# Patient Record
Sex: Female | Born: 1965 | Race: Black or African American | Hispanic: No | Marital: Married | Smoking: Current every day smoker
Health system: Southern US, Community
[De-identification: ages and names within clinical notes are randomized; demographics above are authoritative.]

## PROBLEM LIST (undated history)

## (undated) DIAGNOSIS — C55 Malignant neoplasm of uterus, part unspecified: Secondary | ICD-10-CM

## (undated) DIAGNOSIS — K219 Gastro-esophageal reflux disease without esophagitis: Secondary | ICD-10-CM

## (undated) DIAGNOSIS — M1990 Unspecified osteoarthritis, unspecified site: Secondary | ICD-10-CM

## (undated) DIAGNOSIS — D649 Anemia, unspecified: Secondary | ICD-10-CM

## (undated) DIAGNOSIS — G4730 Sleep apnea, unspecified: Secondary | ICD-10-CM

## (undated) DIAGNOSIS — K2970 Gastritis, unspecified, without bleeding: Secondary | ICD-10-CM

## (undated) DIAGNOSIS — K589 Irritable bowel syndrome without diarrhea: Secondary | ICD-10-CM

## (undated) DIAGNOSIS — I10 Essential (primary) hypertension: Secondary | ICD-10-CM

## (undated) DIAGNOSIS — C541 Malignant neoplasm of endometrium: Secondary | ICD-10-CM

## (undated) DIAGNOSIS — E119 Type 2 diabetes mellitus without complications: Secondary | ICD-10-CM

## (undated) DIAGNOSIS — E039 Hypothyroidism, unspecified: Secondary | ICD-10-CM

## (undated) DIAGNOSIS — Z1211 Encounter for screening for malignant neoplasm of colon: Secondary | ICD-10-CM

## (undated) DIAGNOSIS — R51 Headache: Secondary | ICD-10-CM

## (undated) DIAGNOSIS — G909 Disorder of the autonomic nervous system, unspecified: Secondary | ICD-10-CM

## (undated) DIAGNOSIS — F32A Depression, unspecified: Secondary | ICD-10-CM

## (undated) DIAGNOSIS — F329 Major depressive disorder, single episode, unspecified: Secondary | ICD-10-CM

## (undated) DIAGNOSIS — K859 Acute pancreatitis without necrosis or infection, unspecified: Secondary | ICD-10-CM

## (undated) DIAGNOSIS — R0602 Shortness of breath: Secondary | ICD-10-CM

## (undated) DIAGNOSIS — F419 Anxiety disorder, unspecified: Secondary | ICD-10-CM

## (undated) DIAGNOSIS — E7800 Pure hypercholesterolemia, unspecified: Secondary | ICD-10-CM

## (undated) DIAGNOSIS — E559 Vitamin D deficiency, unspecified: Secondary | ICD-10-CM

## (undated) DIAGNOSIS — L0293 Carbuncle, unspecified: Secondary | ICD-10-CM

## (undated) DIAGNOSIS — J449 Chronic obstructive pulmonary disease, unspecified: Secondary | ICD-10-CM

## (undated) DIAGNOSIS — L0292 Furuncle, unspecified: Secondary | ICD-10-CM

## (undated) DIAGNOSIS — G5600 Carpal tunnel syndrome, unspecified upper limb: Secondary | ICD-10-CM

## (undated) HISTORY — PX: OOPHORECTOMY: SHX86

## (undated) HISTORY — DX: Major depressive disorder, single episode, unspecified: F32.9

## (undated) HISTORY — DX: Acute pancreatitis without necrosis or infection, unspecified: K85.90

## (undated) HISTORY — DX: Malignant neoplasm of uterus, part unspecified: C55

## (undated) HISTORY — DX: Unspecified osteoarthritis, unspecified site: M19.90

## (undated) HISTORY — DX: Vitamin D deficiency, unspecified: E55.9

## (undated) HISTORY — DX: Depression, unspecified: F32.A

## (undated) HISTORY — DX: Encounter for screening for malignant neoplasm of colon: Z12.11

## (undated) HISTORY — DX: Pure hypercholesterolemia, unspecified: E78.00

## (undated) HISTORY — DX: Anxiety disorder, unspecified: F41.9

## (undated) HISTORY — DX: Carpal tunnel syndrome, unspecified upper limb: G56.00

## (undated) HISTORY — PX: TUBAL LIGATION: SHX77

## (undated) HISTORY — DX: Disorder of the autonomic nervous system, unspecified: G90.9

## (undated) HISTORY — DX: Malignant neoplasm of endometrium: C54.1

## (undated) HISTORY — DX: Essential (primary) hypertension: I10

## (undated) HISTORY — PX: FOOT SURGERY: SHX648

## (undated) HISTORY — DX: Sleep apnea, unspecified: G47.30

## (undated) HISTORY — DX: Irritable bowel syndrome, unspecified: K58.9

## (undated) HISTORY — DX: Gastro-esophageal reflux disease without esophagitis: K21.9

## (undated) HISTORY — DX: Gastritis, unspecified, without bleeding: K29.70

---

## 1994-09-11 HISTORY — PX: REDUCTION MAMMAPLASTY: SUR839

## 2002-02-18 ENCOUNTER — Emergency Department (HOSPITAL_COMMUNITY): Admission: EM | Admit: 2002-02-18 | Discharge: 2002-02-18 | Payer: Self-pay | Admitting: Emergency Medicine

## 2004-07-18 ENCOUNTER — Ambulatory Visit (HOSPITAL_COMMUNITY): Admission: RE | Admit: 2004-07-18 | Discharge: 2004-07-18 | Payer: Self-pay | Admitting: Obstetrics & Gynecology

## 2005-01-15 ENCOUNTER — Emergency Department (HOSPITAL_COMMUNITY): Admission: EM | Admit: 2005-01-15 | Discharge: 2005-01-15 | Payer: Self-pay | Admitting: Emergency Medicine

## 2005-01-25 ENCOUNTER — Ambulatory Visit (HOSPITAL_COMMUNITY): Admission: RE | Admit: 2005-01-25 | Discharge: 2005-01-25 | Payer: Self-pay | Admitting: Family Medicine

## 2005-10-16 ENCOUNTER — Ambulatory Visit (HOSPITAL_COMMUNITY): Admission: RE | Admit: 2005-10-16 | Discharge: 2005-10-16 | Payer: Self-pay | Admitting: Family Medicine

## 2006-10-18 ENCOUNTER — Ambulatory Visit (HOSPITAL_COMMUNITY): Admission: RE | Admit: 2006-10-18 | Discharge: 2006-10-18 | Payer: Self-pay | Admitting: Internal Medicine

## 2007-10-24 ENCOUNTER — Ambulatory Visit (HOSPITAL_COMMUNITY): Admission: RE | Admit: 2007-10-24 | Discharge: 2007-10-24 | Payer: Self-pay | Admitting: Family Medicine

## 2008-10-26 ENCOUNTER — Ambulatory Visit (HOSPITAL_COMMUNITY): Admission: RE | Admit: 2008-10-26 | Discharge: 2008-10-26 | Payer: Self-pay | Admitting: Family Medicine

## 2009-12-06 ENCOUNTER — Emergency Department (HOSPITAL_COMMUNITY): Admission: EM | Admit: 2009-12-06 | Discharge: 2009-12-06 | Payer: Self-pay | Admitting: Emergency Medicine

## 2010-02-07 ENCOUNTER — Emergency Department (HOSPITAL_COMMUNITY): Admission: EM | Admit: 2010-02-07 | Discharge: 2010-02-07 | Payer: Self-pay | Admitting: Emergency Medicine

## 2010-11-28 LAB — POCT CARDIAC MARKERS
CKMB, poc: <1 ng/mL — ABNORMAL LOW (ref 1.0–8.0)
Myoglobin, poc: 97.7 ng/mL (ref 12–200)
Troponin i, poc: <0.05 ng/mL (ref 0.00–0.09)

## 2011-04-03 ENCOUNTER — Ambulatory Visit: Payer: Self-pay | Admitting: Gastroenterology

## 2011-04-10 ENCOUNTER — Ambulatory Visit (INDEPENDENT_AMBULATORY_CARE_PROVIDER_SITE_OTHER): Payer: Self-pay | Admitting: Gastroenterology

## 2011-04-10 ENCOUNTER — Encounter: Payer: Self-pay | Admitting: Gastroenterology

## 2011-04-10 DIAGNOSIS — K219 Gastro-esophageal reflux disease without esophagitis: Secondary | ICD-10-CM

## 2011-04-10 DIAGNOSIS — R109 Unspecified abdominal pain: Secondary | ICD-10-CM

## 2011-04-10 DIAGNOSIS — K5900 Constipation, unspecified: Secondary | ICD-10-CM

## 2011-04-10 MED ORDER — OMEPRAZOLE 20 MG PO CPDR: 20.0000 mg | DELAYED_RELEASE_CAPSULE | Freq: Every day | ORAL | Status: DC

## 2011-04-10 NOTE — Progress Notes (Signed)
Referring Provider: No ref. provider found Primary Care Physician:  Tylene Fantasia., PA, PA-C Primary Gastroenterologist:  Dr. Darrick Penna  Chief Complaint  Patient presents with  . Abdominal Pain  . Constipation    HPI:   [name] is a pleasant 45 year old female who presents today with concerns of upper abdominal pain and "knots" in her abdomen. She reports history of chronic constipation, with lengths of time between BMs up to 2 weeks. She is only able to have a BM when she takes a laxative. Reports feeling bloated. Complains of upper abdominal discomfort, sharp, constant, worsened with intercourse. States this pain has been present since her son was born 20+ years ago. Feels like she has knots bilaterally. Denies brbpr. However, states stool is "black" when she "cleans herself out".  Reports upper abdominal pain worse with eating, sensation of early satiety. No N/V. Denies dysphagia, yet she reports feeling "thickness" in her throat. Ibuprofen,  BC powders, every once in awhile.   Complains of "bad" indigestion, sometimes nocturnal reflux. Not on a PPI, taking Zantac.    Past Medical History  Diagnosis Date  . HTN (hypertension)   . GERD (gastroesophageal reflux disease)   . Constipation   . Hypercholesterolemia     Past Surgical History  Procedure Date  . Cesarean section   . Breast reduction surgery     Current Outpatient Prescriptions  Medication Sig Dispense Refill  . co-enzyme Q-10 30 MG capsule Take 30 mg by mouth 3 (three) times daily.        . famotidine (PEPCID) 20 MG tablet Take 20 mg by mouth 2 (two) times daily.        . furosemide (LASIX) 40 MG tablet Take 40 mg by mouth 2 (two) times daily.        [name] lisinopril-hydrochlorothiazide (PRINZIDE,ZESTORETIC) 20-25 MG per tablet Take 1 tablet by mouth daily.        . ranitidine (ZANTAC) 150 MG tablet Take 150 mg by mouth 2 (two) times daily.        [name] omeprazole (PRILOSEC) 20 MG capsule Take 1 capsule (20 mg total)  by mouth daily. Take 30 minutes before breakfast.  31 capsule  3    Allergies as of 04/10/2011  . (No Known Allergies)    Family History  Problem Relation Age of Onset  . Colon cancer Neg Hx     History   Social History  . Marital Status: Single    Spouse Name: N/A    Number of Children: 1  . Years of Education: N/A   Occupational History  . Not on file.   Social History Main Topics  . Smoking status: Current Everyday Smoker -- 1.0 packs/day for 22 years    Types: Cigarettes  . Smokeless tobacco: Not on file  . Alcohol Use: No  . Drug Use: No  . Sexually Active: Not on file   Other Topics Concern  . Not on file   Social History Narrative  . No narrative on file    Review of Systems: Gen: Denies any fever, chills, loss of appetite, fatigue, weight loss. CV: Denies chest pain, heart palpitations, syncope, peripheral edema. Resp: Denies shortness of breath with rest, cough, wheezing GI: Denies dysphagia or odynophagia. Denies hematemesis, fecal incontinence, or jaundice.  GU : Denies urinary burning, urinary frequency, urinary incontinence.  MS: Denies joint pain, muscle weakness, cramps, limited movement Derm: Denies rash, itching, dry skin Psych: Denies depression, anxiety, confusion or memory loss  Heme: Denies  bruising, bleeding, and enlarged lymph nodes.  Physical Exam: BP 149/88  Pulse 88  Temp(Src) 97.9 F (36.6 C) (Temporal)  Ht 5\' 6"  (1.676 m)  Wt 282 lb (127.914 kg)  BMI 45.52 kg/m2 General:   Alert and oriented. Well-developed, well-nourished, pleasant and cooperative. Obese.  Head:  Normocephalic and atraumatic. Eyes:  Conjunctiva pink, sclera clear, no icterus.   Ears:  Normal auditory acuity. Nose:  No deformity, discharge,  or lesions. Mouth:  No deformity or lesions, mucosa pink and moist.  Neck:  Supple, without mass or thyromegaly. Lungs:  Clear to auscultation bilaterally, without wheezing, rales, or rhonchi.  Heart:  S1, S2 present  without murmurs noted.  Abdomen:  Obese, large AP diameter,+BS, soft, tender to palpation bilateral upper abdomen. non-distended. No definite mass appreciated on physical exam. When patient stood up, firmness noted bilateral upper abdomen, might very well be adipose tissue. Without mass or HSM. No rebound or guarding. No hernias noted. Rectal:  Deferred  Msk:  Symmetrical without gross deformities. Normal posture. Extremities:  Without clubbing or edema. Neurologic:  Alert and  oriented x4;  grossly normal neurologically. Skin:  Intact, warm and dry without significant lesions or rashes Cervical Nodes:  No significant cervical adenopathy. Psych:  Alert and cooperative. Normal mood and affect.

## 2011-04-10 NOTE — Patient Instructions (Signed)
Stop taking Ranitidine. Start taking Prilosec 20 mg daily, 30 minutes before breakfast. This has been sent to your pharmacy, and we have provided samples.  Please talk with Lubertha Basque so we can proceed with CT and labs.  Start following a high fiber diet.  Add a probiotic daily. Samples have been provided (can get [employer] over the counter)  Take Miralax as needed for constipation. You may take daily if needed.  We will see you back in 4 weeks.

## 2011-04-11 DIAGNOSIS — K219 Gastro-esophageal reflux disease without esophagitis: Secondary | ICD-10-CM | POA: Insufficient documentation

## 2011-04-11 DIAGNOSIS — K5900 Constipation, unspecified: Secondary | ICD-10-CM | POA: Insufficient documentation

## 2011-04-11 DIAGNOSIS — R109 Unspecified abdominal pain: Secondary | ICD-10-CM | POA: Insufficient documentation

## 2011-04-11 NOTE — Assessment & Plan Note (Signed)
Obese, needs dietary modification, wt loss, PPI. See abdominal pain.

## 2011-04-11 NOTE — Assessment & Plan Note (Signed)
See "abdominal pain"

## 2011-04-11 NOTE — Assessment & Plan Note (Signed)
45 year old African-American female with complaints of bilateral upper abdominal pain for 20+ years, since birth of son. Reports "knots" on her abdomen in area of pain. Constant, worsened with intercourse. Sensation of early satiety, thickness in her throat. No N/V or dysphagia. Rare use of BC powders, Ibuprofen. Pt has central pattern of obesity; "knots" she feels may very well be adipose tissue, doubt an acute etiology or mass at this time. Worsening reflux, nocturnal reflux. On Zantac. No insurance, needs [facility] assistance. Abdominal pain may be r/t gastritis, PUD, doubt hepatobiliary component or chronic pancreatitis. Question of constipation compounding symptoms or exacerbating.   Start Prilosec. Stoop Zantac HFP, CBC, lipase  ifobt High fiber diet for constipation Probiotic Miralax prn F/U in 4 weeks CT in future to assess areas of concern ("knots" in abdomen) once pt obtains financial assistance Likely proceeding with colonoscopy+/- EGD in future after review of labs, trial of PPI

## 2011-04-12 NOTE — Progress Notes (Signed)
Cc to PCP 

## 2011-04-17 NOTE — Progress Notes (Signed)
AGREE. PT NEEDS EGD. TCS ONLY IF ALARM SYMPTOMS

## 2011-04-20 ENCOUNTER — Telehealth: Payer: Self-pay | Admitting: Gastroenterology

## 2011-04-20 NOTE — Telephone Encounter (Signed)
Pt has not completed labs or CT as ordered. Needs EGD as well, no colonoscopy at this time. Please have pt complete labs and CT, then we will proceed with EGD.

## 2011-04-25 ENCOUNTER — Telehealth: Payer: Self-pay | Admitting: Gastroenterology

## 2011-04-25 DIAGNOSIS — K219 Gastro-esophageal reflux disease without esophagitis: Secondary | ICD-10-CM

## 2011-04-25 NOTE — Telephone Encounter (Signed)
Sonya James called an stated that the patient is 100% approved for [facility] [employer] and she is now ready to go forward with her care. I called Sonya James and asked if she still wanted to go through with the CT ABD W/CM and she said yes.

## 2011-04-25 NOTE — Telephone Encounter (Signed)
Please see 8/14 phone note

## 2011-04-25 NOTE — Telephone Encounter (Signed)
Great. Needs to proceed with labs as outlined in OV and CT. Then, needs to be set up for EGD. Dr. Darrick Penna.

## 2011-04-26 NOTE — Telephone Encounter (Signed)
Pt is scheduled for ct scan 04/27/11@2 :45pm.  She is also scheduled for her procedure 05/22/11@7 :30am

## 2011-04-26 NOTE — Telephone Encounter (Signed)
Addended by: Jennings Books on: 04/26/2011 12:59 PM   Modules accepted: Orders

## 2011-04-27 ENCOUNTER — Ambulatory Visit (HOSPITAL_COMMUNITY)
Admission: RE | Admit: 2011-04-27 | Discharge: 2011-04-27 | Disposition: A | Discharge status: Home / Self Care | Payer: Self-pay | Source: Ambulatory Visit | Attending: Gastroenterology | Admitting: Gastroenterology

## 2011-04-27 DIAGNOSIS — F17200 Nicotine dependence, unspecified, uncomplicated: Secondary | ICD-10-CM | POA: Insufficient documentation

## 2011-04-27 DIAGNOSIS — I10 Essential (primary) hypertension: Secondary | ICD-10-CM | POA: Insufficient documentation

## 2011-04-27 DIAGNOSIS — R109 Unspecified abdominal pain: Secondary | ICD-10-CM | POA: Insufficient documentation

## 2011-04-27 DIAGNOSIS — E7800 Pure hypercholesterolemia, unspecified: Secondary | ICD-10-CM | POA: Insufficient documentation

## 2011-04-27 LAB — HEPATIC FUNCTION PANEL
ALT: 13 U/L (ref 0–35)
AST: 16 U/L (ref 0–37)
Albumin: 4.3 g/dL (ref 3.5–5.2)
Alkaline Phosphatase: 78 U/L (ref 39–117)
Bilirubin, Direct: 0.1 mg/dL (ref 0.0–0.3)
Indirect Bilirubin: 0.2 mg/dL — ABNORMAL LOW (ref 0.3–0.9)
Total Bilirubin: 0.3 mg/dL (ref 0.3–1.2)
Total Protein: 8.6 g/dL — ABNORMAL HIGH (ref 6.0–8.3)

## 2011-04-27 LAB — LIPASE, BLOOD: Lipase: 30 U/L (ref 11–59)

## 2011-04-27 MED ORDER — IOHEXOL 300 MG/ML  SOLN
100.0000 mL | Freq: Once | INTRAMUSCULAR | Status: AC | PRN
  Administered 2011-04-27: 100 mL via INTRAVENOUS

## 2011-04-28 LAB — DIFFERENTIAL
Basophils Absolute: 0 10*3/uL (ref 0.0–0.1)
Basophils Relative: 0 % (ref 0–1)
Eosinophils Absolute: 0.3 10*3/uL (ref 0.0–0.7)
Eosinophils Relative: 4 % (ref 0–5)
Lymphocytes Relative: 38 % (ref 12–46)
Lymphs Abs: 3 10*3/uL (ref 0.7–4.0)
Monocytes Absolute: 0.6 10*3/uL (ref 0.1–1.0)
Monocytes Relative: 7 % (ref 3–12)
Neutro Abs: 4 10*3/uL (ref 1.7–7.7)
Neutrophils Relative %: 51 % (ref 43–77)

## 2011-04-28 LAB — CBC
HCT: 41.2 % (ref 36.0–46.0)
Hemoglobin: 13.8 g/dL (ref 12.0–15.0)
MCH: 30.7 pg (ref 26.0–34.0)
MCHC: 33.5 g/dL (ref 30.0–36.0)
MCV: 91.8 fL (ref 78.0–100.0)
Platelets: 393 10*3/uL (ref 150–400)
RBC: 4.49 MIL/uL (ref 3.87–5.11)
RDW: 12.6 % (ref 11.5–15.5)
WBC: 7.8 10*3/uL (ref 4.0–10.5)

## 2011-05-01 NOTE — Progress Notes (Signed)
Quick Note:  Pt informed ______ 

## 2011-05-01 NOTE — Progress Notes (Signed)
Quick Note:  PT informed, CT normal and liver and pancreas enzymes are normal. ______

## 2011-05-08 ENCOUNTER — Ambulatory Visit: Payer: Self-pay | Admitting: Gastroenterology

## 2011-05-08 ENCOUNTER — Telehealth: Payer: Self-pay | Admitting: Gastroenterology

## 2011-05-09 NOTE — Telephone Encounter (Signed)
Routed to provider

## 2011-05-19 MED ORDER — SODIUM CHLORIDE 0.45 % IV SOLN
Freq: Once | INTRAVENOUS | Status: AC
  Administered 2011-05-22: 08:00:00 via INTRAVENOUS

## 2011-05-22 ENCOUNTER — Other Ambulatory Visit: Payer: Self-pay | Admitting: Gastroenterology

## 2011-05-22 ENCOUNTER — Encounter (HOSPITAL_COMMUNITY)
Admission: RE | Disposition: A | Discharge status: Home / Self Care | Payer: Self-pay | Source: Ambulatory Visit | Attending: Gastroenterology

## 2011-05-22 ENCOUNTER — Encounter (HOSPITAL_COMMUNITY): Payer: Self-pay | Admitting: *Deleted

## 2011-05-22 ENCOUNTER — Ambulatory Visit (HOSPITAL_COMMUNITY)
Admission: RE | Admit: 2011-05-22 | Discharge: 2011-05-22 | Disposition: A | Discharge status: Home / Self Care | Payer: Self-pay | Source: Ambulatory Visit | Attending: Gastroenterology | Admitting: Gastroenterology

## 2011-05-22 DIAGNOSIS — K2990 Gastroduodenitis, unspecified, without bleeding: Secondary | ICD-10-CM

## 2011-05-22 DIAGNOSIS — K2970 Gastritis, unspecified, without bleeding: Secondary | ICD-10-CM

## 2011-05-22 DIAGNOSIS — Z79899 Other long term (current) drug therapy: Secondary | ICD-10-CM | POA: Insufficient documentation

## 2011-05-22 DIAGNOSIS — I10 Essential (primary) hypertension: Secondary | ICD-10-CM | POA: Insufficient documentation

## 2011-05-22 DIAGNOSIS — A048 Other specified bacterial intestinal infections: Secondary | ICD-10-CM | POA: Insufficient documentation

## 2011-05-22 DIAGNOSIS — K2940 Chronic atrophic gastritis without bleeding: Secondary | ICD-10-CM | POA: Insufficient documentation

## 2011-05-22 DIAGNOSIS — R1013 Epigastric pain: Secondary | ICD-10-CM | POA: Insufficient documentation

## 2011-05-22 HISTORY — DX: Shortness of breath: R06.02

## 2011-05-22 HISTORY — DX: Headache: R51

## 2011-05-22 HISTORY — DX: Chronic obstructive pulmonary disease, unspecified: J44.9

## 2011-05-22 HISTORY — PX: ESOPHAGOGASTRODUODENOSCOPY: SHX5428

## 2011-05-22 SURGERY — EGD (ESOPHAGOGASTRODUODENOSCOPY)
Anesthesia: Moderate Sedation

## 2011-05-22 MED ORDER — MIDAZOLAM HCL 5 MG/5ML IJ SOLN
INTRAMUSCULAR | Status: DC | PRN
  Administered 2011-05-22 (×2): 2 mg via INTRAVENOUS

## 2011-05-22 MED ORDER — FAMOTIDINE 20 MG PO TABS: 20.0000 mg | ORAL_TABLET | Freq: Two times a day (BID) | ORAL | Status: DC | PRN

## 2011-05-22 MED ORDER — MEPERIDINE HCL 100 MG/ML IJ SOLN
INTRAMUSCULAR | Status: AC
  Filled 2011-05-22: qty 2

## 2011-05-22 MED ORDER — RANITIDINE HCL 150 MG PO TABS: 150.0000 mg | ORAL_TABLET | Freq: Two times a day (BID) | ORAL | Status: DC

## 2011-05-22 MED ORDER — MEPERIDINE HCL 100 MG/ML IJ SOLN
INTRAMUSCULAR | Status: DC | PRN
  Administered 2011-05-22: 50 mg via INTRAVENOUS
  Administered 2011-05-22: 25 mg via INTRAVENOUS

## 2011-05-22 MED ORDER — SODIUM CHLORIDE 0.9 % IJ SOLN
INTRAMUSCULAR | Status: AC
  Filled 2011-05-22: qty 10

## 2011-05-22 MED ORDER — BUTAMBEN-TETRACAINE-BENZOCAINE 2-2-14 % EX AERO
INHALATION_SPRAY | CUTANEOUS | Status: DC | PRN
  Administered 2011-05-22: 2 via TOPICAL

## 2011-05-22 MED ORDER — PROMETHAZINE HCL 25 MG/ML IJ SOLN
INTRAMUSCULAR | Status: AC
  Filled 2011-05-22: qty 1

## 2011-05-22 MED ORDER — MIDAZOLAM HCL 5 MG/5ML IJ SOLN
INTRAMUSCULAR | Status: AC
  Filled 2011-05-22: qty 10

## 2011-05-22 MED ORDER — LUBIPROSTONE 8 MCG PO CAPS: 8.0000 ug | ORAL_CAPSULE | Freq: Two times a day (BID) | ORAL | Status: DC

## 2011-05-22 NOTE — Interval H&P Note (Signed)
History and Physical Interval Note:   05/22/2011   8:52 AM   Sonya James  has presented today for surgery, with the diagnosis of gerd  The various methods of treatment have been discussed with the patient and family. After consideration of risks, benefits and other options for treatment, the patient has consented to  Procedure(s): ESOPHAGOGASTRODUODENOSCOPY (EGD) as a surgical intervention .  I have reviewed the patients' chart and labs.  Questions were answered to the patient's satisfaction.     [name]  MD

## 2011-05-22 NOTE — H&P (Signed)
Reason for Visit     Abdominal Pain    Constipation        Current Vitals       Recorded User        04/10/2011  9:09 AM  Sonya James, NT           BP Pulse Temp (Src) Resp Ht Wt    149/88  88  97.9 F (36.6 C) (Temporal)  N/A  5\' 6"  (1.676 m)  282 lb (127.914 kg)       BMI SpO2 PF LMP    45.52 kg/m2  N/A  N/A  N/A          Progress Notes     Sonya Halls, NP  04/11/2011 10:08 PM  Signed     Referring Provider: No ref. provider found Primary Care Physician:  Sonya James., PA, PA-C Primary Gastroenterologist:  Sonya James    Chief Complaint   Patient presents with   .  Abdominal Pain   .  Constipation      HPI:    Sonya James is a pleasant 45 year old female who presents today with concerns of upper abdominal pain and "knots" in her abdomen. She reports history of chronic constipation, with lengths of time between BMs up to 2 weeks. She is only able to have a BM when she takes a laxative. Reports feeling bloated. Complains of upper abdominal discomfort, sharp, constant, worsened with intercourse. States this pain has been present since her son was born 20+ years ago. Feels like she has knots bilaterally. Denies brbpr. However, states stool is "black" when she "cleans herself out".   Reports upper abdominal pain worse with eating, sensation of early satiety. No N/V. Denies dysphagia, yet she reports feeling "thickness" in her throat. Ibuprofen,  BC powders, every once in awhile.    Complains of "bad" indigestion, sometimes nocturnal reflux. Not on a PPI, taking Zantac.       Past Medical History   Diagnosis  Date   .  HTN (hypertension)     .  GERD (gastroesophageal reflux disease)     .  Constipation     .  Hypercholesterolemia         Past Surgical History   Procedure  Date   .  Cesarean section     .  Breast reduction surgery         Current Outpatient Prescriptions   Medication  Sig  Dispense  Refill   .  co-enzyme Q-10 30 MG capsule   Take 30 mg by mouth 3 (three) times daily.           .  famotidine (PEPCID) 20 MG tablet  Take 20 mg by mouth 2 (two) times daily.           .  furosemide (LASIX) 40 MG tablet  Take 40 mg by mouth 2 (two) times daily.           [name]  lisinopril-hydrochlorothiazide (PRINZIDE,ZESTORETIC) 20-25 MG per tablet  Take 1 tablet by mouth daily.           .  ranitidine (ZANTAC) 150 MG tablet  Take 150 mg by mouth 2 (two) times daily.           [name]  omeprazole (PRILOSEC) 20 MG capsule  Take 1 capsule (20 mg total) by mouth daily. Take 30 minutes before breakfast.   31 capsule   3       Allergies  as of 04/10/2011   .  (No Known Allergies)       Family History   Problem  Relation  Age of Onset   .  Colon cancer  Neg Hx         History       Social History   .  Marital Status:  Single       Spouse Name:  N/A       Number of Children:  1   .  Years of Education:  N/A       Occupational History   .  Not on file.       Social History Main Topics   .  Smoking status:  Current Everyday Smoker -- 1.0 packs/day for 22 years       Types:  Cigarettes   .  Smokeless tobacco:  Not on file   .  Alcohol Use:  No   .  Drug Use:  No   .  Sexually Active:  Not on file       Other Topics  Concern   .  Not on file       Social History Narrative   .  No narrative on file      Review of Systems: Gen: Denies any fever, chills, loss of appetite, fatigue, weight loss. CV: Denies chest pain, heart palpitations, syncope, peripheral edema. Resp: Denies shortness of breath with rest, cough, wheezing GI: Denies dysphagia or odynophagia. Denies hematemesis, fecal incontinence, or jaundice.   GU : Denies urinary burning, urinary frequency, urinary incontinence.   MS: Denies joint pain, muscle weakness, cramps, limited movement Derm: Denies rash, itching, dry skin Psych: Denies depression, anxiety, confusion or memory loss   Heme: Denies bruising, bleeding, and enlarged lymph nodes.   Physical Exam: BP  149/88  Pulse 88  Temp(Src) 97.9 F (36.6 C) (Temporal)  Ht 5\' 6"  (1.676 m)  Wt 282 lb (127.914 kg)  BMI 45.52 kg/m2 General:   Alert and oriented. Well-developed, well-nourished, pleasant and cooperative. Obese.   Head:  Normocephalic and atraumatic. Eyes:  Conjunctiva pink, sclera clear, no icterus.    Ears:  Normal auditory acuity. Nose:  No deformity, discharge,  or lesions. Mouth:  No deformity or lesions, mucosa pink and moist.   Neck:  Supple, without mass or thyromegaly. Lungs:  Clear to auscultation bilaterally, without wheezing, rales, or rhonchi.   Heart:  S1, S2 present without murmurs noted.   Abdomen:  Obese, large AP diameter,+BS, soft, tender to palpation bilateral upper abdomen. non-distended. No definite mass appreciated on physical exam. When patient stood up, firmness noted bilateral upper abdomen, might very well be adipose tissue. Without mass or HSM. No rebound or guarding. No hernias noted. Rectal:  Deferred   Msk:  Symmetrical without gross deformities. Normal posture. Extremities:  Without clubbing or edema. Neurologic:  Alert and  oriented x4;  grossly normal neurologically. Skin:  Intact, warm and dry without significant lesions or rashes Cervical Nodes:  No significant cervical adenopathy. Psych:  Alert and cooperative. Normal mood and affect.       Sonya James  04/12/2011  9:58 AM  Signed Cc to PCP  Sonya Eva, MD  04/17/2011 10:13 AM  Signed AGREE. PT NEEDS EGD. TCS ONLY IF ALARM SYMPTOMS        Abdominal pain - Sonya Halls, NP  04/11/2011 10:07 PM  Signed 45 year old African-American female with complaints of bilateral upper abdominal pain for 20+ years, since birth of  son. Reports "knots" on her abdomen in area of pain. Constant, worsened with intercourse. Sensation of early satiety, thickness in her throat. No N/V or dysphagia. Rare use of BC powders, Ibuprofen. Pt has central pattern of obesity; "knots" she feels may very well be adipose tissue,  doubt an acute etiology or mass at this time. Worsening reflux, nocturnal reflux. On Zantac. No insurance, needs [facility] assistance. Abdominal pain may be r/t gastritis, PUD, doubt hepatobiliary component or chronic pancreatitis. Question of constipation compounding symptoms or exacerbating.    Start Prilosec. Stoop Zantac HFP, CBC, lipase  ifobt High fiber diet for constipation Probiotic Miralax prn F/U in 4 weeks CT in future to assess areas of concern ("knots" in abdomen) once pt obtains financial assistance Likely proceeding with colonoscopy+/- EGD in future after review of labs, trial of PPI         Constipation - Sonya Halls, NP  04/11/2011 10:07 PM  Signed See abdominal pain  GERD (gastroesophageal reflux disease) - Sonya Halls, NP  04/11/2011 10:08 PM  Signed Obese, needs dietary modification, wt loss, PPI. See abdominal pain.

## 2011-05-30 ENCOUNTER — Telehealth: Payer: Self-pay

## 2011-05-30 NOTE — Telephone Encounter (Signed)
Pt called for results from her EGD. Also, said she could not afford the Amitiza 8 mcg. She was given that Rx for bid. Rx would have cost $308.00. She has no insurance. I told her we could see if there is a patient assistance for that medication.( She is aware that Dr. Darrick Penna is not in, but will be in the office tomorrow).

## 2011-05-31 ENCOUNTER — Encounter (HOSPITAL_COMMUNITY): Payer: Self-pay | Admitting: Gastroenterology

## 2011-05-31 ENCOUNTER — Telehealth: Payer: Self-pay | Admitting: Gastroenterology

## 2011-05-31 NOTE — Telephone Encounter (Signed)
Results Cc to PCP  

## 2011-05-31 NOTE — Telephone Encounter (Signed)
Please call pt. ISSUE #1-Her stomach Bx showed H. Pylori infection. She needs AMOXICILLIN 500 mg 2 po BID for 10 days and Biaxin 500 mg po bid for 10 days, #qs, rfx0. She needs Omeprazole 20 mg BID for 10 days then 1 po diaLY for 3 mos, #30, rfx3. Med side effects include NVD, abd pain, and metallic taste. SHE SHOULD NOT TAKE PEPCID WHILE TAKING BIAXIN.  ISSUE #2-She may fill out the paperwork for Amitiza. Let her know we may be able to provide samples when we can until she completes the paperwork.  OPV in 4 mos.

## 2011-05-31 NOTE — Telephone Encounter (Signed)
Pt is aware of OV for 1/10 at 0800 with AS

## 2011-05-31 NOTE — Telephone Encounter (Signed)
LMOM to call.

## 2011-06-01 NOTE — Telephone Encounter (Signed)
LMOM to call.

## 2011-06-02 NOTE — Telephone Encounter (Signed)
LMOM to call.

## 2011-06-05 ENCOUNTER — Other Ambulatory Visit (HOSPITAL_COMMUNITY): Payer: Self-pay | Admitting: Family Medicine

## 2011-06-05 DIAGNOSIS — L989 Disorder of the skin and subcutaneous tissue, unspecified: Secondary | ICD-10-CM

## 2011-06-05 NOTE — Telephone Encounter (Signed)
Called [phone] and left message that it was very important for a return call.  Tried to call [phone] and VM not set up. Mailing a letter for pt to call.

## 2011-06-06 NOTE — Telephone Encounter (Signed)
See telephone note of 05/31/2011.

## 2011-06-06 NOTE — Telephone Encounter (Signed)
Pt returned call and was informed of all of the info. Rx's called to [facility] and LM on VM. Samples of Amitiza  # 16 left at front for pt. Pt assistance forms left at the front with the samples, and pt was instructed to complete and return the forms to [place].

## 2011-06-07 ENCOUNTER — Telehealth: Payer: Self-pay

## 2011-06-07 MED ORDER — LUBIPROSTONE 8 MCG PO CAPS: 8.0000 ug | ORAL_CAPSULE | Freq: Two times a day (BID) | ORAL | Status: DC

## 2011-06-07 MED ORDER — OMEPRAZOLE 20 MG PO CPDR: 20.0000 mg | DELAYED_RELEASE_CAPSULE | Freq: Every day | ORAL | Status: DC

## 2011-06-07 NOTE — Telephone Encounter (Signed)
Pt called- she has qualified for med-assist and needs written rx for her stomach medicines. She said especially amitiza. She said she will come by here tomorrow to pick them up.

## 2011-06-07 NOTE — Telephone Encounter (Signed)
RX done for [employer] and prilosec.

## 2011-06-09 ENCOUNTER — Ambulatory Visit (HOSPITAL_COMMUNITY)
Admission: RE | Admit: 2011-06-09 | Discharge: 2011-06-09 | Disposition: A | Discharge status: Home / Self Care | Payer: Self-pay | Source: Ambulatory Visit | Attending: Family Medicine | Admitting: Family Medicine

## 2011-06-09 DIAGNOSIS — L989 Disorder of the skin and subcutaneous tissue, unspecified: Secondary | ICD-10-CM

## 2011-06-13 NOTE — Telephone Encounter (Signed)
Noted. Please have pt return in 4 mos per EGD note.

## 2011-06-13 NOTE — Telephone Encounter (Signed)
Pt is aware of OV for 09/21/11 at 0800 with AS

## 2011-06-19 ENCOUNTER — Telehealth: Payer: Self-pay | Admitting: Gastroenterology

## 2011-06-19 MED ORDER — LUBIPROSTONE 8 MCG PO CAPS: 8.0000 ug | ORAL_CAPSULE | Freq: Two times a day (BID) | ORAL | Status: AC

## 2011-06-19 NOTE — Telephone Encounter (Signed)
Patient said that she couldn't get Gen Amitiza from med assist and wondered if we could call it in to [facility] in [place]. Please advise??

## 2011-08-16 ENCOUNTER — Encounter (HOSPITAL_COMMUNITY): Payer: Self-pay | Admitting: *Deleted

## 2011-08-16 ENCOUNTER — Emergency Department (HOSPITAL_COMMUNITY): Payer: Self-pay

## 2011-08-16 ENCOUNTER — Emergency Department (HOSPITAL_COMMUNITY)
Admission: EM | Admit: 2011-08-16 | Discharge: 2011-08-16 | Disposition: A | Discharge status: Home / Self Care | Payer: Self-pay | Attending: Emergency Medicine | Admitting: Emergency Medicine

## 2011-08-16 DIAGNOSIS — F17200 Nicotine dependence, unspecified, uncomplicated: Secondary | ICD-10-CM | POA: Insufficient documentation

## 2011-08-16 DIAGNOSIS — K219 Gastro-esophageal reflux disease without esophagitis: Secondary | ICD-10-CM | POA: Insufficient documentation

## 2011-08-16 DIAGNOSIS — E7800 Pure hypercholesterolemia, unspecified: Secondary | ICD-10-CM | POA: Insufficient documentation

## 2011-08-16 DIAGNOSIS — I10 Essential (primary) hypertension: Secondary | ICD-10-CM | POA: Insufficient documentation

## 2011-08-16 DIAGNOSIS — J45909 Unspecified asthma, uncomplicated: Secondary | ICD-10-CM | POA: Insufficient documentation

## 2011-08-16 DIAGNOSIS — M79609 Pain in unspecified limb: Secondary | ICD-10-CM | POA: Insufficient documentation

## 2011-08-16 DIAGNOSIS — M79671 Pain in right foot: Secondary | ICD-10-CM

## 2011-08-16 MED ORDER — IBUPROFEN 800 MG PO TABS: 800.0000 mg | ORAL_TABLET | Freq: Three times a day (TID) | ORAL | Status: AC

## 2011-08-16 NOTE — ED Provider Notes (Signed)
History     CSN: [id number] Arrival date & time: 08/16/2011  4:52 PM   First MD Initiated Contact with Patient 08/16/11 1716      Chief Complaint  Patient presents with  . Foot Pain    right foot    (Consider location/radiation/quality/duration/timing/severity/associated sxs/prior treatment) Patient is a 45 y.o. female presenting with lower extremity pain. The history is provided by the patient. No language interpreter was used.  Foot Pain This is a new problem. Episode onset: 2-3 weeks ago. The problem occurs constantly. The problem has been unchanged. Associated symptoms comments: Pain with weight bearing.  No known injury. The symptoms are aggravated by standing and walking. She has tried nothing for the symptoms.    Past Medical History  Diagnosis Date  . HTN (hypertension)   . GERD (gastroesophageal reflux disease)   . Constipation   . Hypercholesterolemia   . Asthma   . COPD (chronic obstructive pulmonary disease)   . Shortness of breath   . Headache     Past Surgical History  Procedure Date  . Cesarean section   . Breast reduction surgery   . Tubal ligation   . Esophagogastroduodenoscopy 05/22/2011    Procedure: ESOPHAGOGASTRODUODENOSCOPY (EGD);  Surgeon: Arlyce Harman, MD;  Location: [facility];  Service: Endoscopy;  Laterality: N/A;    Family History  Problem Relation Age of Onset  . Colon cancer Neg Hx     History  Substance Use Topics  . Smoking status: Current Everyday Smoker -- 1.0 packs/day for 22 years    Types: Cigarettes  . Smokeless tobacco: Not on file  . Alcohol Use: No    OB History    Grav Para Term Preterm Abortions TAB SAB Ect Mult Living                  Review of Systems  Musculoskeletal:       Foot pain  All other systems reviewed and are negative.    Allergies  Review of patient's allergies indicates no known allergies.  Home Medications   Current Outpatient Rx  Name Route Sig Dispense Refill  . ATORVASTATIN  CALCIUM 20 MG PO TABS Oral Take 20 mg by mouth daily.      [name] ESOMEPRAZOLE MAGNESIUM 40 MG PO CPDR Oral Take 40 mg by mouth every morning.      . FUROSEMIDE 40 MG PO TABS Oral Take 40 mg by mouth 2 (two) times daily.      [name] POTASSIUM CHLORIDE 10 MEQ PO TBCR Oral Take 10 mEq by mouth 2 (two) times daily.      . QUINAPRIL-HYDROCHLOROTHIAZIDE 20-25 MG PO TABS Oral Take 1 tablet by mouth daily.        BP 138/103  Pulse 72  Temp(Src) 98.6 F (37 C) (Oral)  Resp 20  Ht 5\' 5"  (1.651 m)  Wt 271 lb (122.925 kg)  BMI 45.10 kg/m2  SpO2 96%  LMP 08/06/2011  Physical Exam  Nursing note and vitals reviewed. Constitutional: She is oriented to person, place, and time. She appears well-developed and well-nourished. No distress.  HENT:  Head: Normocephalic and atraumatic.  Eyes: EOM are normal.  Neck: Normal range of motion.  Cardiovascular: Normal rate, regular rhythm and normal heart sounds.   Pulmonary/Chest: Effort normal and breath sounds normal.  Abdominal: Soft. She exhibits no distension. There is no tenderness.  Musculoskeletal: She exhibits tenderness.       Right foot: She exhibits decreased range of motion, tenderness, bony  tenderness and swelling. She exhibits normal capillary refill, no crepitus, no deformity and no laceration.       Feet:  Neurological: She is alert and oriented to person, place, and time.  Skin: Skin is warm and dry.  Psychiatric: She has a normal mood and affect. Judgment normal.    ED Course  Procedures (including critical care time)  Labs Reviewed - No data to display Dg Foot Complete Right  08/16/2011  *RADIOLOGY REPORT*  Clinical Data: Pain and palpable knot on the dorsal aspect of foot.  RIGHT FOOT COMPLETE - 3+ VIEW  Comparison: None.  Findings: No evidence of acute fracture or dislocation.  Degenerative spurring is seen along the dorsal aspect of the tarsal - metatarsal joints and midfoot, as well as the first MTP joint. Plantar and dorsal calcaneal  spurs are also noted.  IMPRESSION:  1.  No acute findings. 2.  Prominent degenerative spurring along the dorsal aspect of the tarsal - metatarsal joints in the  midfoot, and the first MTP joint. 3.  Calcaneal spurs.  Original Report Authenticated By: Danae Orleans, M.D.     No diagnosis found.    MDM         Worthy Rancher, PA 08/16/11 [id number]

## 2011-08-16 NOTE — ED Notes (Signed)
C/o right foot pain x 2 weeks; has a knot on dorsal surface of foot where pain is -states this has been there for years.

## 2011-08-16 NOTE — ED Notes (Signed)
Pt states rt foot has had increasing pain x 2 weeks. Pain increases with weight bearing. Xray completed.  Extremity elevated.

## 2011-08-17 NOTE — ED Provider Notes (Signed)
Medical screening examination/treatment/procedure(s) were performed by non-physician practitioner and as supervising physician I was immediately available for consultation/collaboration.   Murial Beam L Verlin Uher, MD 08/17/11 0001 

## 2011-09-21 ENCOUNTER — Telehealth: Payer: Self-pay | Admitting: Gastroenterology

## 2011-09-21 ENCOUNTER — Ambulatory Visit: Payer: Self-pay | Admitting: Gastroenterology

## 2011-09-21 NOTE — Telephone Encounter (Signed)
PT WAS A NO SHOW 

## 2011-09-25 NOTE — Telephone Encounter (Signed)
First no-show. Needs regular f/u visit with [place].

## 2011-09-26 ENCOUNTER — Encounter: Payer: Self-pay | Admitting: Gastroenterology

## 2011-09-26 NOTE — Telephone Encounter (Signed)
Mailed letter for patient to call office to [facility]

## 2012-08-12 ENCOUNTER — Encounter (HOSPITAL_COMMUNITY): Payer: Self-pay | Admitting: Emergency Medicine

## 2012-08-12 DIAGNOSIS — Z87891 Personal history of nicotine dependence: Secondary | ICD-10-CM | POA: Insufficient documentation

## 2012-08-12 DIAGNOSIS — J4489 Other specified chronic obstructive pulmonary disease: Secondary | ICD-10-CM | POA: Insufficient documentation

## 2012-08-12 DIAGNOSIS — E7800 Pure hypercholesterolemia, unspecified: Secondary | ICD-10-CM | POA: Insufficient documentation

## 2012-08-12 DIAGNOSIS — K219 Gastro-esophageal reflux disease without esophagitis: Secondary | ICD-10-CM | POA: Insufficient documentation

## 2012-08-12 DIAGNOSIS — Z79899 Other long term (current) drug therapy: Secondary | ICD-10-CM | POA: Insufficient documentation

## 2012-08-12 DIAGNOSIS — I10 Essential (primary) hypertension: Secondary | ICD-10-CM | POA: Insufficient documentation

## 2012-08-12 DIAGNOSIS — K859 Acute pancreatitis without necrosis or infection, unspecified: Secondary | ICD-10-CM | POA: Insufficient documentation

## 2012-08-12 DIAGNOSIS — J449 Chronic obstructive pulmonary disease, unspecified: Secondary | ICD-10-CM | POA: Insufficient documentation

## 2012-08-12 DIAGNOSIS — Z8739 Personal history of other diseases of the musculoskeletal system and connective tissue: Secondary | ICD-10-CM | POA: Insufficient documentation

## 2012-08-12 NOTE — ED Notes (Signed)
Patient c/o abdominal pain x 2 weeks; denies nausea, vomiting or diarrhea.

## 2012-08-13 ENCOUNTER — Emergency Department (HOSPITAL_COMMUNITY): Payer: Self-pay

## 2012-08-13 ENCOUNTER — Emergency Department (HOSPITAL_COMMUNITY)
Admission: EM | Admit: 2012-08-13 | Discharge: 2012-08-13 | Disposition: A | Discharge status: Home / Self Care | Payer: Self-pay | Attending: Emergency Medicine | Admitting: Emergency Medicine

## 2012-08-13 DIAGNOSIS — K859 Acute pancreatitis without necrosis or infection, unspecified: Secondary | ICD-10-CM

## 2012-08-13 HISTORY — DX: Unspecified osteoarthritis, unspecified site: M19.90

## 2012-08-13 LAB — COMPREHENSIVE METABOLIC PANEL
ALT: 10 U/L (ref 0–35)
Alkaline Phosphatase: 73 U/L (ref 39–117)
BUN: 8 mg/dL (ref 6–23)
CO2: 25 mEq/L (ref 19–32)
Calcium: 10 mg/dL (ref 8.4–10.5)
GFR calc Af Amer: >90 mL/min (ref >90)
GFR calc non Af Amer: 79 mL/min — ABNORMAL LOW (ref >90)
Glucose, Bld: 114 mg/dL — ABNORMAL HIGH (ref 70–99)
Total Protein: 7.3 g/dL (ref 6.0–8.3)

## 2012-08-13 LAB — URINALYSIS, ROUTINE W REFLEX MICROSCOPIC
Leukocytes, UA: NEGATIVE
Nitrite: NEGATIVE
Protein, ur: NEGATIVE mg/dL
Specific Gravity, Urine: 1.025 (ref 1.005–1.030)
Urobilinogen, UA: 0.2 mg/dL (ref 0.0–1.0)

## 2012-08-13 LAB — CBC WITH DIFFERENTIAL/PLATELET
Basophils Absolute: 0.1 10*3/uL (ref 0.0–0.1)
Eosinophils Absolute: 0.4 10*3/uL (ref 0.0–0.7)
HCT: 34.4 % — ABNORMAL LOW (ref 36.0–46.0)
Lymphocytes Relative: 34 % (ref 12–46)
MCHC: 33.1 g/dL (ref 30.0–36.0)
Monocytes Relative: 9 % (ref 3–12)
Neutro Abs: 3.5 10*3/uL (ref 1.7–7.7)
Neutrophils Relative %: 50 % (ref 43–77)
RDW: 13.9 % (ref 11.5–15.5)
WBC: 7 10*3/uL (ref 4.0–10.5)

## 2012-08-13 LAB — POCT PREGNANCY, URINE: Preg Test, Ur: NEGATIVE

## 2012-08-13 LAB — URINE MICROSCOPIC-ADD ON

## 2012-08-13 MED ORDER — OXYCODONE-ACETAMINOPHEN 5-325 MG PO TABS: 1.0000 | ORAL_TABLET | ORAL | Status: DC | PRN

## 2012-08-13 MED ORDER — GI COCKTAIL ~~LOC~~
30.0000 mL | Freq: Once | ORAL | Status: AC
  Administered 2012-08-13: 30 mL via ORAL
  Filled 2012-08-13: qty 30

## 2012-08-13 MED ORDER — FAMOTIDINE 20 MG PO TABS
20.0000 mg | ORAL_TABLET | Freq: Once | ORAL | Status: AC
  Administered 2012-08-13: 20 mg via ORAL
  Filled 2012-08-13: qty 1

## 2012-08-13 MED ORDER — NAPROXEN 500 MG PO TABS: 500.0000 mg | ORAL_TABLET | Freq: Two times a day (BID) | ORAL | Status: DC

## 2012-08-13 MED ORDER — IOHEXOL 300 MG/ML  SOLN
120.0000 mL | Freq: Once | INTRAMUSCULAR | Status: AC | PRN
  Administered 2012-08-13: 120 mL via INTRAVENOUS

## 2012-08-13 MED ORDER — ONDANSETRON 4 MG PO TBDP: 4.0000 mg | ORAL_TABLET | Freq: Three times a day (TID) | ORAL | Status: DC | PRN

## 2012-08-13 NOTE — ED Notes (Signed)
Patient returned from CT scan via stretcher.  Patient A&O; skin w/d. Respirations even and unlabored; able to speak in complete sentences without difficulty.  Family member remains at bedside.

## 2012-08-13 NOTE — ED Notes (Signed)
Discharge instructions given and reviewed with patient.  Prescriptions given for Zofran, Percocet and Naproxen; effects and use explained for each.  Patient verbalized understanding of sedating effects of Percocet and to increase her fluids to reduce chance of constipation while taking and to follow up with her physician as needed.  Patient ambulatory with steady gait; discharged home in good condition.

## 2012-08-13 NOTE — ED Provider Notes (Signed)
History     CSN: [id number]  Arrival date & time 08/12/12  2333   First MD Initiated Contact with Patient 08/13/12 0046      Chief Complaint  Patient presents with  . Abdominal Pain    (Consider location/radiation/quality/duration/timing/severity/associated sxs/prior treatment) HPI Comments: 46 year old female with a history of acid reflux disease, hypertension and COPD who presents with a complaint of upper abdominal pain. She states has been going on for 2 weeks was gradual in onset, seems to be worse at night and better during the day and does not get worse with eating. She denies any fevers, chills or vomiting but does have some nausea. She has not been coughing, is not short of breath and is in not experienced any swelling of her lower extremities. She has been taking her antidepressant medication without fail. She has never had abdominal surgery, she has never had any trouble with her gallbladder, is not a drinker of alcohol and does not have a problem with her gallbladder that she knows of. If her pain as 9/10 at this time  Patient is a 46 y.o. female presenting with abdominal pain. The history is provided by the patient, a relative and a parent.  Abdominal Pain The primary symptoms of the illness include abdominal pain.    Past Medical History  Diagnosis Date  . HTN (hypertension)   . GERD (gastroesophageal reflux disease)   . Constipation   . Hypercholesterolemia   . Asthma   . COPD (chronic obstructive pulmonary disease)   . Shortness of breath   . Headache   . Osteoarthritis     Past Surgical History  Procedure Date  . Cesarean section   . Breast reduction surgery   . Tubal ligation   . Esophagogastroduodenoscopy 05/22/2011    Procedure: ESOPHAGOGASTRODUODENOSCOPY (EGD);  Surgeon: Arlyce Harman, MD;  Location: [facility];  Service: Endoscopy;  Laterality: N/A;    Family History  Problem Relation Age of Onset  . Colon cancer Neg Hx     History  Substance  Use Topics  . Smoking status: Former Smoker -- 1.0 packs/day for 22 years    Types: Cigarettes  . Smokeless tobacco: Not on file  . Alcohol Use: No    OB History    Grav Para Term Preterm Abortions TAB SAB Ect Mult Living                  Review of Systems  Gastrointestinal: Positive for abdominal pain.  All other systems reviewed and are negative.    Allergies  Review of patient's allergies indicates no known allergies.  Home Medications   Current Outpatient Rx  Name  Route  Sig  Dispense  Refill  . ESOMEPRAZOLE MAGNESIUM 40 MG PO CPDR   Oral   Take 40 mg by mouth every morning.           . FUROSEMIDE 40 MG PO TABS   Oral   Take 40 mg by mouth 2 (two) times daily.           [name] POTASSIUM CHLORIDE 10 MEQ PO TBCR   Oral   Take 10 mEq by mouth 2 (two) times daily.           . QUINAPRIL-HYDROCHLOROTHIAZIDE 20-25 MG PO TABS   Oral   Take 1 tablet by mouth daily.           [name] ROSUVASTATIN CALCIUM 40 MG PO TABS   Oral   Take 40 mg by  mouth daily.         . ATORVASTATIN CALCIUM 20 MG PO TABS   Oral   Take 20 mg by mouth daily.           [name] NAPROXEN 500 MG PO TABS   Oral   Take 1 tablet (500 mg total) by mouth 2 (two) times daily with a meal.   30 tablet   0   . ONDANSETRON 4 MG PO TBDP   Oral   Take 1 tablet (4 mg total) by mouth every 8 (eight) hours as needed for nausea.   10 tablet   0   . OXYCODONE-ACETAMINOPHEN 5-325 MG PO TABS   Oral   Take 1 tablet by mouth every 4 (four) hours as needed for pain.   20 tablet   0     BP 153/92  Pulse 58  Temp 98.2 F (36.8 C) (Oral)  Resp 20  Wt 280 lb (127.007 kg)  SpO2 100%  LMP 08/05/2012  Physical Exam  Nursing note and vitals reviewed. Constitutional: She appears well-developed and well-nourished. No distress.  HENT:  Head: Normocephalic and atraumatic.  Mouth/Throat: Oropharynx is clear and moist. No oropharyngeal exudate.  Eyes: Conjunctivae normal and EOM are normal. Pupils are  equal, round, and reactive to light. Right eye exhibits no discharge. Left eye exhibits no discharge. No scleral icterus.  Neck: Normal range of motion. Neck supple. No JVD present. No thyromegaly present.  Cardiovascular: Normal rate, regular rhythm, normal heart sounds and intact distal pulses.  Exam reveals no gallop and no friction rub.   No murmur heard. Pulmonary/Chest: Effort normal and breath sounds normal. No respiratory distress. She has no wheezes. She has no rales.  Abdominal: Soft. Bowel sounds are normal. She exhibits no distension and no mass. There is tenderness ( Mild to moderate epigastric, right upper quadrant and left upper quadrant tenderness, right upper quadrant seems to be more tender than the other sites. There is no lower abdominal tenderness, very soft abdomen).       No peritoneal signs  Musculoskeletal: Normal range of motion. She exhibits no edema and no tenderness.  Lymphadenopathy:    She has no cervical adenopathy.  Neurological: She is alert. Coordination normal.  Skin: Skin is warm and dry. No rash noted. No erythema.  Psychiatric: She has a normal mood and affect. Her behavior is normal.    ED Course  Procedures (including critical care time)  Labs Reviewed  LIPASE, BLOOD - Abnormal; Notable for the following:    Lipase 111 (*)     All other components within normal limits  COMPREHENSIVE METABOLIC PANEL - Abnormal; Notable for the following:    Glucose, Bld 114 (*)     Albumin 3.4 (*)     Total Bilirubin 0.1 (*)     GFR calc non Af Amer 79 (*)     All other components within normal limits  CBC WITH DIFFERENTIAL - Abnormal; Notable for the following:    Hemoglobin 11.4 (*)     HCT 34.4 (*)     Eosinophils Relative 6 (*)     All other components within normal limits  URINALYSIS, ROUTINE W REFLEX MICROSCOPIC - Abnormal; Notable for the following:    Hgb urine dipstick SMALL (*)     All other components within normal limits  URINE MICROSCOPIC-ADD ON  - Abnormal; Notable for the following:    Squamous Epithelial / LPF FEW (*)     Bacteria, UA FEW (*)  All other components within normal limits  POCT PREGNANCY, URINE   Ct Abdomen Pelvis W Contrast  08/13/2012  *RADIOLOGY REPORT*  Clinical Data: Epigastric abdominal pain for 2 weeks.  CT ABDOMEN AND PELVIS WITH CONTRAST  Technique:  Multidetector CT imaging of the abdomen and pelvis was performed following the standard protocol during bolus administration of intravenous contrast.  Contrast: OMNIPAQUE IOHEXOL 300 MG/ML  SOLN  Comparison: CT of the abdomen performed 04/27/2011  Findings: The visualized lung bases are clear.  The liver and spleen are unremarkable in appearance.  The gallbladder is within normal limits.  The adrenal glands are unremarkable in appearance.  There appears to be minimal soft tissue stranding about the entirety of the pancreas; this could reflect very mild pancreatitis, given elevated lipase.  The kidneys are unremarkable in appearance.  There is no evidence of hydronephrosis.  No renal or ureteral stones are seen.  No perinephric stranding is appreciated.  No free fluid is identified.  The small bowel is unremarkable in appearance.  The stomach is within normal limits.  No acute vascular abnormalities are seen.  The appendix is normal in caliber, without evidence for appendicitis.  The colon is partially filled with stool, and is otherwise unremarkable in appearance.  The bladder is mildly distended and grossly unremarkable in appearance.  A small enhancing fibroid is suggested along the right side of the uterus; the uterus is otherwise unremarkable.  The ovaries are relatively symmetric; no suspicious adnexal masses are seen.  No inguinal lymphadenopathy is seen.  No acute osseous abnormalities are identified.  Mild facet disease is noted along the lumbar spine.  IMPRESSION:  1.  Minimal apparent soft tissue stranding about the entirety of the pancreas could reflect very  mild pancreatitis, given elevated lipase.  No evidence for devascularization or pseudocyst formation. 2.  Question of small enhancing fibroid along the right side of the uterus.   Original Report Authenticated By: Tonia Ghent, M.D.      1. Pancreatitis       MDM  Overall the patient appears very well, she has mild hypertension but otherwise normal vital signs and at this time has findings concerning for cholecystitis, gastritis or pancreatitis. Labs pending, GI cocktail, no peritoneal signs.  Patient reexamined, states that most of her pain was taken away with a GI cocktail. Her lipase is slightly elevated and her CT scan does show mild inflammation around the pancreas. I've given her full instructions on how to eat a healthy diet that is compatible with pancreatitis, she appears stable for discharge and has expressed her understanding.      Vida Roller, MD 08/13/12 [phone]

## 2012-11-08 ENCOUNTER — Encounter (HOSPITAL_COMMUNITY): Payer: Self-pay | Admitting: *Deleted

## 2012-11-08 ENCOUNTER — Emergency Department (HOSPITAL_COMMUNITY)
Admission: EM | Admit: 2012-11-08 | Discharge: 2012-11-08 | Disposition: A | Discharge status: Home / Self Care | Payer: Self-pay | Attending: Emergency Medicine | Admitting: Emergency Medicine

## 2012-11-08 DIAGNOSIS — J45909 Unspecified asthma, uncomplicated: Secondary | ICD-10-CM | POA: Insufficient documentation

## 2012-11-08 DIAGNOSIS — J40 Bronchitis, not specified as acute or chronic: Secondary | ICD-10-CM | POA: Insufficient documentation

## 2012-11-08 DIAGNOSIS — I10 Essential (primary) hypertension: Secondary | ICD-10-CM | POA: Insufficient documentation

## 2012-11-08 DIAGNOSIS — J3489 Other specified disorders of nose and nasal sinuses: Secondary | ICD-10-CM | POA: Insufficient documentation

## 2012-11-08 DIAGNOSIS — J329 Chronic sinusitis, unspecified: Secondary | ICD-10-CM | POA: Insufficient documentation

## 2012-11-08 DIAGNOSIS — J029 Acute pharyngitis, unspecified: Secondary | ICD-10-CM | POA: Insufficient documentation

## 2012-11-08 DIAGNOSIS — Z87891 Personal history of nicotine dependence: Secondary | ICD-10-CM | POA: Insufficient documentation

## 2012-11-08 DIAGNOSIS — R6883 Chills (without fever): Secondary | ICD-10-CM | POA: Insufficient documentation

## 2012-11-08 DIAGNOSIS — R0602 Shortness of breath: Secondary | ICD-10-CM | POA: Insufficient documentation

## 2012-11-08 DIAGNOSIS — R05 Cough: Secondary | ICD-10-CM | POA: Insufficient documentation

## 2012-11-08 DIAGNOSIS — Z8719 Personal history of other diseases of the digestive system: Secondary | ICD-10-CM | POA: Insufficient documentation

## 2012-11-08 DIAGNOSIS — K219 Gastro-esophageal reflux disease without esophagitis: Secondary | ICD-10-CM | POA: Insufficient documentation

## 2012-11-08 DIAGNOSIS — E7800 Pure hypercholesterolemia, unspecified: Secondary | ICD-10-CM | POA: Insufficient documentation

## 2012-11-08 DIAGNOSIS — J449 Chronic obstructive pulmonary disease, unspecified: Secondary | ICD-10-CM | POA: Insufficient documentation

## 2012-11-08 DIAGNOSIS — Z79899 Other long term (current) drug therapy: Secondary | ICD-10-CM | POA: Insufficient documentation

## 2012-11-08 DIAGNOSIS — R059 Cough, unspecified: Secondary | ICD-10-CM | POA: Insufficient documentation

## 2012-11-08 DIAGNOSIS — J4489 Other specified chronic obstructive pulmonary disease: Secondary | ICD-10-CM | POA: Insufficient documentation

## 2012-11-08 DIAGNOSIS — Z8739 Personal history of other diseases of the musculoskeletal system and connective tissue: Secondary | ICD-10-CM | POA: Insufficient documentation

## 2012-11-08 MED ORDER — AZITHROMYCIN 250 MG PO TABS: 250.0000 mg | ORAL_TABLET | Freq: Every day | ORAL | Status: DC

## 2012-11-08 MED ORDER — ALBUTEROL SULFATE (5 MG/ML) 0.5% IN NEBU: 2.5000 mg | INHALATION_SOLUTION | RESPIRATORY_TRACT | Status: DC | PRN

## 2012-11-08 MED ORDER — BENZONATATE 100 MG PO CAPS: 100.0000 mg | ORAL_CAPSULE | Freq: Three times a day (TID) | ORAL | Status: DC

## 2012-11-08 NOTE — ED Provider Notes (Signed)
History  This chart was scribed for Sonya Roller, MD by Shari Heritage, ED Scribe. The patient was seen in room [facility]. Patient's care was started at 0831.   CSN: [id number]  Arrival date & time 11/08/12  0821   First MD Initiated Contact with Patient 11/08/12 0831      Chief Complaint  Patient presents with  . Wheezing  . Cough    The history is provided by the patient. No language interpreter was used.     HPI Comments: Sonya James is a 47 y.o. female with history of COPD, asthma who presents to the Emergency Department complaining of persistent cough, shortness of breath and nasal congestion onset 2 weeks ago. Cough is productive of yellow sputum. Patient also reports associated symptoms of sore throat, wheezing, rhinorrhea and chills. She states that drainage from her nose was yellow at first then became clear. Patient says that she has been using her inhaler at home without improvement. She hasn't tried neb treatments because she is out of her nebulizer solution. Patient's mother has had similar symptoms for the past week. Patient denies fever, abdominal pain, back pain or leg swelling. Other medical history includes HTN, GERD, hypercholesteremia. Patient is a former smoker, but uses smokeless tobacco. Patient has not seen her PCP for this problem.  Past Medical History  Diagnosis Date  . HTN (hypertension)   . GERD (gastroesophageal reflux disease)   . Constipation   . Hypercholesterolemia   . Asthma   . COPD (chronic obstructive pulmonary disease)   . Shortness of breath   . Headache   . Osteoarthritis     Past Surgical History  Procedure Laterality Date  . Cesarean section    . Breast reduction surgery    . Tubal ligation    . Esophagogastroduodenoscopy  05/22/2011    Procedure: ESOPHAGOGASTRODUODENOSCOPY (EGD);  Surgeon: Arlyce Harman, MD;  Location: [facility];  Service: Endoscopy;  Laterality: N/A;    Family History  Problem Relation Age of Onset  .  Colon cancer Neg Hx     History  Substance Use Topics  . Smoking status: Former Smoker -- 1.00 packs/day for 22 years    Types: Cigarettes  . Smokeless tobacco: Current User  . Alcohol Use: No    OB History   Grav Para Term Preterm Abortions TAB SAB Ect Mult Living                  Review of Systems  Constitutional: Positive for chills. Negative for fever.  HENT: Positive for congestion, rhinorrhea and sinus pressure.   Respiratory: Positive for cough, shortness of breath and wheezing.   Cardiovascular: Negative for leg swelling.  Gastrointestinal: Negative for abdominal pain.  Musculoskeletal: Negative for back pain.  All other systems reviewed and are negative.    Allergies  Review of patient's allergies indicates no known allergies.  Home Medications   Current Outpatient Rx  Name  Route  Sig  Dispense  Refill  . albuterol (PROVENTIL) (5 MG/ML) 0.5% nebulizer solution   Nebulization   Take 0.5 mLs (2.5 mg total) by nebulization every 4 (four) hours as needed for wheezing or shortness of breath.   20 mL   12   . atorvastatin (LIPITOR) 20 MG tablet   Oral   Take 20 mg by mouth daily.           [name] azithromycin (ZITHROMAX Z-PAK) 250 MG tablet   Oral   Take 1 tablet (250 mg  total) by mouth daily. 500mg  PO day 1, then 250mg  PO days 205   6 tablet   0   . benzonatate (TESSALON) 100 MG capsule   Oral   Take 1 capsule (100 mg total) by mouth every 8 (eight) hours.   21 capsule   0   . esomeprazole (NEXIUM) 40 MG capsule   Oral   Take 40 mg by mouth every morning.           . furosemide (LASIX) 40 MG tablet   Oral   Take 40 mg by mouth 2 (two) times daily.           . naproxen (NAPROSYN) 500 MG tablet   Oral   Take 1 tablet (500 mg total) by mouth 2 (two) times daily with a meal.   30 tablet   0   . ondansetron (ZOFRAN ODT) 4 MG disintegrating tablet   Oral   Take 1 tablet (4 mg total) by mouth every 8 (eight) hours as needed for nausea.   10  tablet   0   . oxyCODONE-acetaminophen (PERCOCET) 5-325 MG per tablet   Oral   Take 1 tablet by mouth every 4 (four) hours as needed for pain.   20 tablet   0   . potassium chloride (KLOR-CON) 10 MEQ CR tablet   Oral   Take 10 mEq by mouth 2 (two) times daily.           . quinapril-hydrochlorothiazide (ACCURETIC) 20-25 MG per tablet   Oral   Take 1 tablet by mouth daily.           . rosuvastatin (CRESTOR) 40 MG tablet   Oral   Take 40 mg by mouth daily.           Triage Vitals: BP 112/79  Pulse 88  Temp(Src) 98.3 F (36.8 C)  Resp 24  Ht 5\' 5"  (1.651 m)  Wt 280 lb (127.007 kg)  BMI 46.59 kg/m2  SpO2 98%  LMP 10/20/2012  Physical Exam  Constitutional: She is oriented to person, place, and time. She appears well-developed and well-nourished.  HENT:  Head: Normocephalic and atraumatic.  Nose: Rhinorrhea present.  Mouth/Throat: Oropharynx is clear and moist.  Clear rhinorrhea. Swollen turbinates.  Eyes: Conjunctivae are normal. Pupils are equal, round, and reactive to light.  Neck: Normal range of motion. Neck supple.  Cardiovascular: Normal rate, regular rhythm and normal heart sounds.   No murmur heard. Pulmonary/Chest: Effort normal and breath sounds normal.  Musculoskeletal: Normal range of motion.  Neurological: She is alert and oriented to person, place, and time.  Skin: Skin is warm and dry.    ED Course  Procedures (including critical care time) DIAGNOSTIC STUDIES: Oxygen Saturation is 98% on room air, normal by my interpretation.    COORDINATION OF CARE: 8:35 AM- Patient informed of current plan for treatment and evaluation and agrees with plan at this time.      Labs Reviewed - No data to display No results found.   1. Bronchitis   2. Sinusitis       MDM  Pt appears to be in minor discomfort from the coughing - she has clear lungs without wheezing, has sa'ts of 98% and no fever - she has URI / sinusitis with bronchitis, home with  zithromax, tessalong and albuterol refills.  No imaging needed at this time.      I personally performed the services described in this documentation, which was scribed in my presence.  The recorded information has been reviewed and is accurate.      Sonya Roller, MD 11/08/12 [phone]

## 2012-11-08 NOTE — ED Notes (Signed)
Dr Miller at bedside,  

## 2012-11-08 NOTE — ED Notes (Signed)
Pt c/o cough, congestion, runny nose, chills, wheezing for week and half, has been using her inhaler at home without much improvement, cough is productive with yellow sputum production, pt reports that she has "ran out" of her nebulizer medication a month ago also.

## 2013-05-25 ENCOUNTER — Emergency Department (HOSPITAL_COMMUNITY): Payer: Self-pay

## 2013-05-25 ENCOUNTER — Encounter (HOSPITAL_COMMUNITY): Payer: Self-pay | Admitting: *Deleted

## 2013-05-25 ENCOUNTER — Inpatient Hospital Stay (HOSPITAL_COMMUNITY)
Admission: EM | Admit: 2013-05-25 | Discharge: 2013-05-27 | DRG: 190 | Disposition: A | Discharge status: Home / Self Care | Payer: Self-pay | Attending: Internal Medicine | Admitting: Internal Medicine

## 2013-05-25 DIAGNOSIS — J449 Chronic obstructive pulmonary disease, unspecified: Secondary | ICD-10-CM | POA: Diagnosis present

## 2013-05-25 DIAGNOSIS — J440 Chronic obstructive pulmonary disease with acute lower respiratory infection: Secondary | ICD-10-CM | POA: Diagnosis present

## 2013-05-25 DIAGNOSIS — Z6841 Body Mass Index (BMI) 40.0 and over, adult: Secondary | ICD-10-CM

## 2013-05-25 DIAGNOSIS — E010 Iodine-deficiency related diffuse (endemic) goiter: Secondary | ICD-10-CM | POA: Diagnosis present

## 2013-05-25 DIAGNOSIS — J45909 Unspecified asthma, uncomplicated: Secondary | ICD-10-CM

## 2013-05-25 DIAGNOSIS — R51 Headache: Secondary | ICD-10-CM | POA: Diagnosis present

## 2013-05-25 DIAGNOSIS — K5900 Constipation, unspecified: Secondary | ICD-10-CM | POA: Diagnosis present

## 2013-05-25 DIAGNOSIS — E669 Obesity, unspecified: Secondary | ICD-10-CM | POA: Diagnosis present

## 2013-05-25 DIAGNOSIS — J441 Chronic obstructive pulmonary disease with (acute) exacerbation: Principal | ICD-10-CM | POA: Diagnosis present

## 2013-05-25 DIAGNOSIS — F17200 Nicotine dependence, unspecified, uncomplicated: Secondary | ICD-10-CM | POA: Diagnosis present

## 2013-05-25 DIAGNOSIS — K219 Gastro-esophageal reflux disease without esophagitis: Secondary | ICD-10-CM | POA: Diagnosis present

## 2013-05-25 DIAGNOSIS — E119 Type 2 diabetes mellitus without complications: Secondary | ICD-10-CM | POA: Diagnosis present

## 2013-05-25 DIAGNOSIS — J209 Acute bronchitis, unspecified: Secondary | ICD-10-CM | POA: Diagnosis present

## 2013-05-25 DIAGNOSIS — M1990 Unspecified osteoarthritis, unspecified site: Secondary | ICD-10-CM | POA: Diagnosis present

## 2013-05-25 DIAGNOSIS — J9600 Acute respiratory failure, unspecified whether with hypoxia or hypercapnia: Secondary | ICD-10-CM | POA: Diagnosis present

## 2013-05-25 DIAGNOSIS — J40 Bronchitis, not specified as acute or chronic: Secondary | ICD-10-CM

## 2013-05-25 DIAGNOSIS — Z720 Tobacco use: Secondary | ICD-10-CM | POA: Diagnosis present

## 2013-05-25 DIAGNOSIS — K589 Irritable bowel syndrome without diarrhea: Secondary | ICD-10-CM | POA: Diagnosis present

## 2013-05-25 DIAGNOSIS — E049 Nontoxic goiter, unspecified: Secondary | ICD-10-CM

## 2013-05-25 DIAGNOSIS — I10 Essential (primary) hypertension: Secondary | ICD-10-CM

## 2013-05-25 DIAGNOSIS — R042 Hemoptysis: Secondary | ICD-10-CM | POA: Diagnosis present

## 2013-05-25 DIAGNOSIS — R600 Localized edema: Secondary | ICD-10-CM | POA: Insufficient documentation

## 2013-05-25 DIAGNOSIS — E7800 Pure hypercholesterolemia, unspecified: Secondary | ICD-10-CM | POA: Diagnosis present

## 2013-05-25 DIAGNOSIS — J4489 Other specified chronic obstructive pulmonary disease: Secondary | ICD-10-CM

## 2013-05-25 DIAGNOSIS — R509 Fever, unspecified: Secondary | ICD-10-CM | POA: Diagnosis present

## 2013-05-25 HISTORY — DX: Unspecified asthma, uncomplicated: J45.909

## 2013-05-25 LAB — CBC WITH DIFFERENTIAL/PLATELET
Basophils Absolute: 0 10*3/uL (ref 0.0–0.1)
Eosinophils Relative: 1 % (ref 0–5)
HCT: 36.5 % (ref 36.0–46.0)
Hemoglobin: 12.3 g/dL (ref 12.0–15.0)
Lymphocytes Relative: 9 % — ABNORMAL LOW (ref 12–46)
MCV: 87.1 fL (ref 78.0–100.0)
Monocytes Absolute: 0.5 10*3/uL (ref 0.1–1.0)
Monocytes Relative: 5 % (ref 3–12)
RDW: 14.2 % (ref 11.5–15.5)
WBC: 9.1 10*3/uL (ref 4.0–10.5)

## 2013-05-25 LAB — URINALYSIS, ROUTINE W REFLEX MICROSCOPIC
Bilirubin Urine: NEGATIVE
Glucose, UA: NEGATIVE mg/dL
Nitrite: NEGATIVE
Specific Gravity, Urine: 1.025 (ref 1.005–1.030)
pH: 6 (ref 5.0–8.0)

## 2013-05-25 LAB — POCT I-STAT, CHEM 8
BUN: 5 mg/dL — ABNORMAL LOW (ref 6–23)
Calcium, Ion: 1.28 mmol/L — ABNORMAL HIGH (ref 1.12–1.23)
Chloride: 106 mEq/L (ref 96–112)
Creatinine, Ser: 0.9 mg/dL (ref 0.50–1.10)
Glucose, Bld: 143 mg/dL — ABNORMAL HIGH (ref 70–99)
TCO2: 22 mmol/L (ref 0–100)

## 2013-05-25 MED ORDER — NICOTINE 14 MG/24HR TD PT24
14.0000 mg | MEDICATED_PATCH | Freq: Every day | TRANSDERMAL | Status: DC
  Administered 2013-05-26 – 2013-05-27 (×2): 14 mg via TRANSDERMAL
  Filled 2013-05-25 (×2): qty 1

## 2013-05-25 MED ORDER — MORPHINE SULFATE 2 MG/ML IJ SOLN: 2.0000 mg | INTRAMUSCULAR | Status: DC | PRN

## 2013-05-25 MED ORDER — PREDNISONE 50 MG PO TABS
60.0000 mg | ORAL_TABLET | Freq: Once | ORAL | Status: AC
  Administered 2013-05-25: 60 mg via ORAL
  Filled 2013-05-25: qty 1

## 2013-05-25 MED ORDER — SODIUM CHLORIDE 0.9 % IJ SOLN
3.0000 mL | Freq: Two times a day (BID) | INTRAMUSCULAR | Status: DC
  Administered 2013-05-26 – 2013-05-27 (×3): 3 mL via INTRAVENOUS

## 2013-05-25 MED ORDER — SODIUM CHLORIDE 0.9 % IV SOLN
INTRAVENOUS | Status: AC
  Administered 2013-05-26: 07:00:00 via INTRAVENOUS

## 2013-05-25 MED ORDER — LISINOPRIL 10 MG PO TABS
10.0000 mg | ORAL_TABLET | Freq: Every day | ORAL | Status: DC
  Administered 2013-05-26 – 2013-05-27 (×2): 10 mg via ORAL
  Filled 2013-05-25 (×2): qty 1

## 2013-05-25 MED ORDER — FUROSEMIDE 40 MG PO TABS
40.0000 mg | ORAL_TABLET | Freq: Every day | ORAL | Status: DC
  Administered 2013-05-26 – 2013-05-27 (×2): 40 mg via ORAL
  Filled 2013-05-25 (×2): qty 1

## 2013-05-25 MED ORDER — INSULIN ASPART 100 UNIT/ML ~~LOC~~ SOLN
0.0000 [IU] | Freq: Three times a day (TID) | SUBCUTANEOUS | Status: DC
  Administered 2013-05-26 (×2): 8 [IU] via SUBCUTANEOUS
  Administered 2013-05-26: 5 [IU] via SUBCUTANEOUS
  Administered 2013-05-27: 8 [IU] via SUBCUTANEOUS
  Administered 2013-05-27: 11 [IU] via SUBCUTANEOUS

## 2013-05-25 MED ORDER — PANTOPRAZOLE SODIUM 40 MG PO TBEC
80.0000 mg | DELAYED_RELEASE_TABLET | Freq: Every day | ORAL | Status: DC
  Administered 2013-05-26 – 2013-05-27 (×2): 80 mg via ORAL
  Filled 2013-05-25 (×2): qty 2

## 2013-05-25 MED ORDER — IPRATROPIUM BROMIDE 0.02 % IN SOLN
0.5000 mg | Freq: Once | RESPIRATORY_TRACT | Status: AC
  Administered 2013-05-25: 0.5 mg via RESPIRATORY_TRACT
  Filled 2013-05-25: qty 2.5

## 2013-05-25 MED ORDER — ALBUTEROL SULFATE (5 MG/ML) 0.5% IN NEBU
2.5000 mg | INHALATION_SOLUTION | RESPIRATORY_TRACT | Status: DC
  Administered 2013-05-26 – 2013-05-27 (×9): 2.5 mg via RESPIRATORY_TRACT
  Filled 2013-05-25 (×9): qty 0.5

## 2013-05-25 MED ORDER — DEXTROSE 5 % IV SOLN
1.0000 g | INTRAVENOUS | Status: DC
  Administered 2013-05-26: 1 g via INTRAVENOUS
  Filled 2013-05-25 (×2): qty 10

## 2013-05-25 MED ORDER — ALBUTEROL (5 MG/ML) CONTINUOUS INHALATION SOLN
15.0000 mg/h | INHALATION_SOLUTION | Freq: Once | RESPIRATORY_TRACT | Status: AC
  Administered 2013-05-25: 15 mg/h via RESPIRATORY_TRACT
  Filled 2013-05-25: qty 20

## 2013-05-25 MED ORDER — SODIUM CHLORIDE 0.9 % IV BOLUS (SEPSIS)
1000.0000 mL | Freq: Once | INTRAVENOUS | Status: AC
  Administered 2013-05-25: 1000 mL via INTRAVENOUS

## 2013-05-25 MED ORDER — ACETAMINOPHEN 650 MG RE SUPP: 650.0000 mg | Freq: Four times a day (QID) | RECTAL | Status: DC | PRN

## 2013-05-25 MED ORDER — SODIUM CHLORIDE 0.9 % IV SOLN: INTRAVENOUS | Status: DC

## 2013-05-25 MED ORDER — IPRATROPIUM BROMIDE 0.02 % IN SOLN
0.5000 mg | RESPIRATORY_TRACT | Status: DC
  Administered 2013-05-26 – 2013-05-27 (×9): 0.5 mg via RESPIRATORY_TRACT
  Filled 2013-05-25 (×9): qty 2.5

## 2013-05-25 MED ORDER — LEVOFLOXACIN IN D5W 750 MG/150ML IV SOLN
750.0000 mg | Freq: Once | INTRAVENOUS | Status: AC
  Administered 2013-05-25: 750 mg via INTRAVENOUS
  Filled 2013-05-25: qty 150

## 2013-05-25 MED ORDER — ENOXAPARIN SODIUM 40 MG/0.4ML ~~LOC~~ SOLN
40.0000 mg | SUBCUTANEOUS | Status: DC
  Administered 2013-05-26 – 2013-05-27 (×2): 40 mg via SUBCUTANEOUS
  Filled 2013-05-25 (×2): qty 0.4

## 2013-05-25 MED ORDER — ACETAMINOPHEN 325 MG PO TABS: 650.0000 mg | ORAL_TABLET | Freq: Four times a day (QID) | ORAL | Status: DC | PRN

## 2013-05-25 MED ORDER — METHYLPREDNISOLONE SODIUM SUCC 125 MG IJ SOLR
80.0000 mg | Freq: Four times a day (QID) | INTRAMUSCULAR | Status: DC
  Administered 2013-05-25 – 2013-05-27 (×7): 80 mg via INTRAVENOUS
  Filled 2013-05-25 (×7): qty 2

## 2013-05-25 MED ORDER — ATORVASTATIN CALCIUM 40 MG PO TABS
80.0000 mg | ORAL_TABLET | Freq: Every day | ORAL | Status: DC
  Administered 2013-05-26: 80 mg via ORAL
  Filled 2013-05-25: qty 2

## 2013-05-25 MED ORDER — OXYCODONE HCL 5 MG PO TABS: 5.0000 mg | ORAL_TABLET | ORAL | Status: DC | PRN

## 2013-05-25 MED ORDER — ACETAMINOPHEN 500 MG PO TABS
1000.0000 mg | ORAL_TABLET | Freq: Once | ORAL | Status: AC
  Administered 2013-05-25: 1000 mg via ORAL
  Filled 2013-05-25: qty 2

## 2013-05-25 MED ORDER — ONDANSETRON HCL 4 MG PO TABS: 4.0000 mg | ORAL_TABLET | Freq: Four times a day (QID) | ORAL | Status: DC | PRN

## 2013-05-25 MED ORDER — ONDANSETRON HCL 4 MG/2ML IJ SOLN: 4.0000 mg | Freq: Four times a day (QID) | INTRAMUSCULAR | Status: DC | PRN

## 2013-05-25 MED ORDER — ALBUTEROL SULFATE (5 MG/ML) 0.5% IN NEBU: 2.5000 mg | INHALATION_SOLUTION | RESPIRATORY_TRACT | Status: DC | PRN

## 2013-05-25 NOTE — ED Notes (Signed)
NT reports that pt's RA sats 96% while ambulating in hall

## 2013-05-25 NOTE — H&P (Signed)
[facility] History and Physical  Sonya James ZOX:[id number] DOB: January 05, 1966 DOA: 05/25/2013   PCP: [facility] Specialists: None  Chief Complaint: Shortness of breath and cough for 3 days  HPI: Sonya James is a 47 y.o. female with a past medical history of hypertension, asthma, COPD, hypercholesterolemia who continues to smoke cigarettes, and, who was in her usual state of health till about 3 days ago, when she started having a cough with shortness of breath. She had fever, although she did not check her temperature at home. She was coughing up greenish expectoration with some blood mixed in it. And, as a result of coughing spells she would have some vomiting as well. Denies any sick contacts. Denies any recent travel. Because of the cough and shortness of breath she's had some chest tightness. She has been dizzy and lightheaded. She has a headache due to the cough and fever. Headache is located all over the head. Denies any vision changes. She denies any focal weakness. She has been wheezing. She has felt weak overall. Since her symptoms were getting worse she decided to come in to the hospital.  Home Medications: Prior to Admission medications   Medication Sig Start Date End Date Taking? Authorizing Provider  albuterol (PROVENTIL) (5 MG/ML) 0.5% nebulizer solution Take 0.5 mLs (2.5 mg total) by nebulization every 4 (four) hours as needed for wheezing or shortness of breath. 11/08/12  Yes Vida Roller, MD  esomeprazole (NEXIUM) 40 MG capsule Take 40 mg by mouth every morning.     Yes Historical Provider, MD  furosemide (LASIX) 40 MG tablet Take 40 mg by mouth 2 (two) times daily.     Yes Historical Provider, MD  lisinopril (PRINIVIL,ZESTRIL) 10 MG tablet Take 10 mg by mouth daily.   Yes Historical Provider, MD  potassium chloride (KLOR-CON) 10 MEQ CR tablet Take 10 mEq by mouth 2 (two) times daily.     Yes Historical Provider, MD  rosuvastatin (CRESTOR) 40 MG tablet Take 40 mg by  mouth daily.   Yes Historical Provider, MD    Allergies: No Known Allergies  Past Medical History: Past Medical History  Diagnosis Date  . HTN (hypertension)   . GERD (gastroesophageal reflux disease)   . Constipation   . Hypercholesterolemia   . Asthma   . COPD (chronic obstructive pulmonary disease)   . Shortness of breath   . Headache(784.0)   . Osteoarthritis     Past Surgical History  Procedure Laterality Date  . Cesarean section    . Breast reduction surgery    . Tubal ligation    . Esophagogastroduodenoscopy  05/22/2011    Procedure: ESOPHAGOGASTRODUODENOSCOPY (EGD);  Surgeon: Arlyce Harman, MD;  [facility];  Service: Endoscopy;  Laterality: N/A;    Social History:  reports that she has been smoking Cigarettes.  She has a 22 pack-year smoking history. She uses smokeless tobacco. She reports that she does not drink alcohol or use illicit drugs.  Living Situation: Lives with her husband, and other family members Activity Level: Usually independent with daily activities   Family History:  Family History  Problem Relation Age of Onset  . Colon cancer Neg Hx   . Diabetes Mother      Review of Systems - History obtained from the patient General ROS: positive for  - chills, fatigue and malaise Psychological ROS: positive for - anxiety Ophthalmic ROS: negative ENT ROS: negative Allergy and Immunology ROS: negative Hematological and Lymphatic ROS: negative Endocrine  ROS: negative Respiratory ROS: as in hpi Cardiovascular ROS: as in hpi Gastrointestinal ROS: no abdominal pain, change in bowel habits, or black or bloody stools Genito-Urinary ROS: no dysuria, trouble voiding, or hematuria Musculoskeletal ROS: negative Neurological ROS: positive for - headaches Dermatological ROS: negative  Physical Examination  Filed Vitals:   05/25/13 1745 05/25/13 1848 05/25/13 2042  BP: 158/99  140/88  Pulse: 95  107  Temp: 101.3 F (38.5 C)  100.9 F (38.3  C)  TempSrc: Oral  Oral  Resp: 23  40  Height: 5\' 5"  (1.651 m)    Weight: 127.007 kg (280 lb)    SpO2: 100% 96% 97%    General appearance: alert, cooperative, appears stated age, no distress and moderately obese Head: Normocephalic, without obvious abnormality, atraumatic Eyes: conjunctivae/corneas clear. PERRL, EOM's intact.  Throat: lips, mucosa, and tongue normal; teeth and gums normal Neck: no adenopathy, no carotid bruit, no JVD, supple, symmetrical, trachea midline. Soft and supple. Enlarged thyroid noted right greater than left. Back: symmetric, no curvature. ROM normal. No CVA tenderness. Resp: She has end expiratory wheezing bilaterally diffusely. No definite crackles. No rhonchi Cardio: regular rate and rhythm, S1, S2 normal, no murmur, click, rub or gallop GI: soft, non-tender; bowel sounds normal; no masses,  no organomegaly Extremities: extremities normal, atraumatic, no cyanosis or edema Pulses: 2+ and symmetric Skin: Skin color, texture, turgor normal. No rashes or lesions Lymph nodes: Cervical, supraclavicular, and axillary nodes normal. Neurologic: Alert and oriented X 3, normal strength and tone. Normal symmetric reflexes. Normal coordination and gait  Laboratory Data: Results for orders placed during the hospital encounter of 05/25/13 (from the past 48 hour(s))  CBC WITH DIFFERENTIAL     Status: Abnormal   Collection Time    05/25/13  6:28 PM      Result Value Range   WBC 9.1  4.0 - 10.5 K/uL   RBC 4.19  3.87 - 5.11 MIL/uL   Hemoglobin 12.3  12.0 - 15.0 g/dL   HCT 09.8  11.9 - 14.7 %   MCV 87.1  78.0 - 100.0 fL   MCH 29.4  26.0 - 34.0 pg   MCHC 33.7  30.0 - 36.0 g/dL   RDW 82.9  56.2 - 13.0 %   Platelets 286  150 - 400 K/uL   Neutrophils Relative % 85 (*) 43 - 77 %   Neutro Abs 7.7  1.7 - 7.7 K/uL   Lymphocytes Relative 9 (*) 12 - 46 %   Lymphs Abs 0.8  0.7 - 4.0 K/uL   Monocytes Relative 5  3 - 12 %   Monocytes Absolute 0.5  0.1 - 1.0 K/uL    Eosinophils Relative 1  0 - 5 %   Eosinophils Absolute 0.1  0.0 - 0.7 K/uL   Basophils Relative 0  0 - 1 %   Basophils Absolute 0.0  0.0 - 0.1 K/uL  POCT I-STAT, CHEM 8     Status: Abnormal   Collection Time    05/25/13  6:43 PM      Result Value Range   Sodium 139  135 - 145 mEq/L   Potassium 3.8  3.5 - 5.1 mEq/L   Chloride 106  96 - 112 mEq/L   BUN 5 (*) 6 - 23 mg/dL   Creatinine, Ser 8.65  0.50 - 1.10 mg/dL   Glucose, Bld 784 (*) 70 - 99 mg/dL   Calcium, Ion 6.96 (*) 1.12 - 1.23 mmol/L   TCO2 22  0 -  100 mmol/L   Hemoglobin 12.9  12.0 - 15.0 g/dL   HCT 45.4  09.8 - 11.9 %    Radiology Reports: Dg Chest 2 View  05/25/2013   CLINICAL DATA:  Cough, fever and history of asthma.  EXAM: CHEST  2 VIEW  COMPARISON:  02/07/2010.  FINDINGS: The cardiac silhouette, mediastinal and hilar contours are within normal limits and stable. The lungs are clear. Minimal stable right basilar scarring changes. No pleural effusion. The bony thorax is intact.  IMPRESSION: No acute cardiopulmonary findings. Minimal streaky right basilar scarring changes are stable.   Electronically Signed   By: Loralie Champagne M.D.   On: 05/25/2013 18:14    Electrocardiogram: Sinus rhythm at 92 beats per minute. Normal axis. Prolong QT interval is noted. No Q waves. No concerning ST or T-wave changes. Nonspecific T-wave changes are noted. Possible LVH criteria is noted.  Problem List  Principal Problem:   Acute bronchitis Active Problems:   GERD (gastroesophageal reflux disease)   Fever   Asthma   COPD (chronic obstructive pulmonary disease)   Tobacco abuse   Obesity   HTN (hypertension), benign   Hypercholesteremia   Thyromegaly   Assessment: This is a 47 year old, African American female, who presents with a three-day history of cough, wheezing, shortness of breath. She appears to have acute bronchitis. There is no infiltrate noted on the chest x-ray. She has a history of asthma and possibly even COPD, and,  unfortunately, continues to smoke cigarettes.  Plan: #1 acute bronchitis: She'll be treated with steroids, nebulizer treatments, and antibiotics. She received a dose of Levaquin in the ED. Because of prolonged QT we will change it to ceftriaxone. Smoking cessation counseling provided. Nicotine patch will be utilized. Blood in sputum is most likely due to bronchitis, and she does have significant wheezing. Continue to monitor.  #2 fever: Most likely due to the bronchitis. A UA will be ordered as well. She will continued on current antibiotics.  #3 chest tightness: Most likely secondary to the bronchitis. EKG does not show any acute ischemic changes. Troponin will be checked.  #4 history of hypertension: Continue with her antihypertensive regimen.  #5 history of pedal edema: Hold diuretics for tonight. Resume from tomorrow, but only once daily.  #6 thyromegaly: She states she's had this for a long time. We'll check a TSH and free T4 level. Further workup can be pursued as an outpatient.  #7 headache: Most likely due to the acute illness and fever. She doesn't have any meningeal signs. Doesn't have any focal neurological deficits. Continue to monitor for now.  #8 hyperglycemia: This is a nonfasting level. Glucose levels will likely go up due to the steroids. We will check HbA1c and put her on a sliding scale coverage.   DVT Prophylaxis: Lovenox Code Status: Full code Family Communication: Discussed with the patient  Disposition Plan: Admit to telemetry   Further management decisions will depend on results of further testing and patient's response to treatment.  [facility]  [facility] Pager [phone]  If 7PM-7AM, please contact night-coverage [email or web address] Password [email or web address]  05/25/2013, 10:00 PM

## 2013-05-25 NOTE — ED Provider Notes (Signed)
CSN: [id number]     Arrival date & time 05/25/13  1730 History   First MD Initiated Contact with Patient 05/25/13 1751     Chief Complaint  Patient presents with  . Cough  . Fever   (Consider location/radiation/quality/duration/timing/severity/associated sxs/prior Treatment) HPI  Patient reports she has a history of COPD and she still smokes a pack a day. She states 3 days ago she started having chills, posttussive vomiting, chest tightness, shortness of breath, wheezing that is not helped by her inhaler, cough with green sputum production, and rhinorrhea that is white, sore throat. She denies any diarrhea or known fever. She is not on oxygen at home. She reports dyspnea on exertion.  PCP PCP [facility]   Past Medical History  Diagnosis Date  . HTN (hypertension)   . GERD (gastroesophageal reflux disease)   . Constipation   . Hypercholesterolemia   . Asthma   . COPD (chronic obstructive pulmonary disease)   . Shortness of breath   . Headache(784.0)   . Osteoarthritis   IBS   Past Surgical History  Procedure Laterality Date  . Cesarean section    . Breast reduction surgery    . Tubal ligation    . Esophagogastroduodenoscopy  05/22/2011    Procedure: ESOPHAGOGASTRODUODENOSCOPY (EGD);  Surgeon: Arlyce Harman, MD;  Location: [facility];  Service: Endoscopy;  Laterality: N/A;   Family History  Problem Relation Age of Onset  . Colon cancer Neg Hx    History  Substance Use Topics  . Smoking status: Former Smoker -- 1.00 packs/day for 22 years    Types: Cigarettes  . Smokeless tobacco: Current User  . Alcohol Use: No  applying for disability   OB History   Grav Para Term Preterm Abortions TAB SAB Ect Mult Living                 Review of Systems  All other systems reviewed and are negative.    Allergies  Review of patient's allergies indicates no known allergies.  Home Medications   Current Outpatient Rx  Name  Route  Sig   Dispense  Refill  . albuterol (PROVENTIL) (5 MG/ML) 0.5% nebulizer solution   Nebulization   Take 0.5 mLs (2.5 mg total) by nebulization every 4 (four) hours as needed for wheezing or shortness of breath.   20 mL   12   . esomeprazole (NEXIUM) 40 MG capsule   Oral   Take 40 mg by mouth every morning.           . furosemide (LASIX) 40 MG tablet   Oral   Take 40 mg by mouth 2 (two) times daily.           [name] lisinopril (PRINIVIL,ZESTRIL) 10 MG tablet   Oral   Take 10 mg by mouth daily.         . potassium chloride (KLOR-CON) 10 MEQ CR tablet   Oral   Take 10 mEq by mouth 2 (two) times daily.           . rosuvastatin (CRESTOR) 40 MG tablet   Oral   Take 40 mg by mouth daily.          BP 140/88  Pulse 107  Temp(Src) 100.9 F (38.3 C) (Oral)  Resp 40  Ht 5\' 5"  (1.651 m)  Wt 280 lb (127.007 kg)  BMI 46.59 kg/m2  SpO2 97%  LMP 05/18/2013  Vital signs normal except tachycardia, low grade fever  Physical Exam  Nursing note and vitals reviewed. Constitutional: She is oriented to person, place, and time. She appears well-developed and well-nourished.  Non-toxic appearance. She does not appear ill. No distress.  HENT:  Head: Normocephalic and atraumatic.  Right Ear: External ear normal.  Left Ear: External ear normal.  Nose: Nose normal. No mucosal edema or rhinorrhea.  Mouth/Throat: Oropharynx is clear and moist and mucous membranes are normal. No dental abscesses or edematous.  Eyes: Conjunctivae and EOM are normal. Pupils are equal, round, and reactive to light.  Neck: Normal range of motion and full passive range of motion without pain. Neck supple.  Cardiovascular: Normal rate, regular rhythm and normal heart sounds.  Exam reveals no gallop and no friction rub.   No murmur heard. Pulmonary/Chest: Accessory muscle usage present. Tachypnea noted. No respiratory distress. She has decreased breath sounds. She has wheezes. She has rhonchi. She has no rales. She  exhibits no tenderness and no crepitus.  Frequent coughing and choking  Abdominal: Soft. Normal appearance and bowel sounds are normal. She exhibits no distension. There is no tenderness. There is no rebound and no guarding.  Musculoskeletal: Normal range of motion. She exhibits no edema and no tenderness.  Moves all extremities well.   Neurological: She is alert and oriented to person, place, and time. She has normal strength. No cranial nerve deficit.  Skin: Skin is warm, dry and intact. No rash noted. No erythema. No pallor.  Psychiatric: She has a normal mood and affect. Her speech is normal and behavior is normal. Her mood appears not anxious.    ED Course  Procedures (including critical care time)  Medications  0.9 %  sodium chloride infusion (not administered)  levofloxacin (LEVAQUIN) IVPB 750 mg (750 mg Intravenous New Bag/Given 05/25/13 2131)  albuterol (PROVENTIL,VENTOLIN) solution continuous neb (15 mg/hr Nebulization Given 05/25/13 1852)  ipratropium (ATROVENT) nebulizer solution 0.5 mg (0.5 mg Nebulization Given 05/25/13 1852)  predniSONE (DELTASONE) tablet 60 mg (60 mg Oral Given 05/25/13 1831)  sodium chloride 0.9 % bolus 1,000 mL (1,000 mLs Intravenous New Bag/Given 05/25/13 2131)    Recheck at the end of her continuous nebulizer shows diffuse rhonchi. Pt not coughing as much.   Pt ambulated by nursing staff and her pulse ox remained 96% on RA.   Recheck after continuous nebulizer, still has some SOB, cough better, still has diffuse diminished BS, end expir rhonchi, tachypnea, pt agreeable to being admitted.   21:31 Dr Rito Ehrlich admit to team 1, tele  Labs Review  Results for orders placed during the hospital encounter of 05/25/13  CBC WITH DIFFERENTIAL      Result Value Range   WBC 9.1  4.0 - 10.5 K/uL   RBC 4.19  3.87 - 5.11 MIL/uL   Hemoglobin 12.3  12.0 - 15.0 g/dL   HCT 28.4  13.2 - 44.0 %   MCV 87.1  78.0 - 100.0 fL   MCH 29.4  26.0 - 34.0 pg   MCHC 33.7  30.0  - 36.0 g/dL   RDW 10.2  72.5 - 36.6 %   Platelets 286  150 - 400 K/uL   Neutrophils Relative % 85 (*) 43 - 77 %   Neutro Abs 7.7  1.7 - 7.7 K/uL   Lymphocytes Relative 9 (*) 12 - 46 %   Lymphs Abs 0.8  0.7 - 4.0 K/uL   Monocytes Relative 5  3 - 12 %   Monocytes Absolute 0.5  0.1 - 1.0 K/uL   Eosinophils  Relative 1  0 - 5 %   Eosinophils Absolute 0.1  0.0 - 0.7 K/uL   Basophils Relative 0  0 - 1 %   Basophils Absolute 0.0  0.0 - 0.1 K/uL  POCT I-STAT, CHEM 8      Result Value Range   Sodium 139  135 - 145 mEq/L   Potassium 3.8  3.5 - 5.1 mEq/L   Chloride 106  96 - 112 mEq/L   BUN 5 (*) 6 - 23 mg/dL   Creatinine, Ser 1.61  0.50 - 1.10 mg/dL   Glucose, Bld 096 (*) 70 - 99 mg/dL   Calcium, Ion 0.45 (*) 1.12 - 1.23 mmol/L   TCO2 22  0 - 100 mmol/L   Hemoglobin 12.9  12.0 - 15.0 g/dL   HCT 40.9  81.1 - 91.4 %   Laboratory interpretation all normal except mild renal insuffic    Imaging Review Dg Chest 2 View  05/25/2013   CLINICAL DATA:  Cough, fever and history of asthma.  EXAM: CHEST  2 VIEW  COMPARISON:  02/07/2010.  FINDINGS: The cardiac silhouette, mediastinal and hilar contours are within normal limits and stable. The lungs are clear. Minimal stable right basilar scarring changes. No pleural effusion. The bony thorax is intact.  IMPRESSION: No acute cardiopulmonary findings. Minimal streaky right basilar scarring changes are stable.   Electronically Signed   By: Loralie Champagne M.D.   On: 05/25/2013 18:14    MDM   1. COPD with exacerbation   2. Fever   3. Bronchitis     Plan admission   Devoria Albe, MD, Franz Dell, MD 05/25/13 2135

## 2013-05-25 NOTE — ED Notes (Signed)
SOB with hx of COPD. Productive cough, green in color x 3 days.

## 2013-05-26 DIAGNOSIS — J441 Chronic obstructive pulmonary disease with (acute) exacerbation: Secondary | ICD-10-CM

## 2013-05-26 LAB — GLUCOSE, CAPILLARY
Glucose-Capillary: 239 mg/dL — ABNORMAL HIGH (ref 70–99)
Glucose-Capillary: 262 mg/dL — ABNORMAL HIGH (ref 70–99)
Glucose-Capillary: 289 mg/dL — ABNORMAL HIGH (ref 70–99)
Glucose-Capillary: 287 mg/dL — ABNORMAL HIGH (ref 70–99)

## 2013-05-26 LAB — COMPREHENSIVE METABOLIC PANEL
AST: 13 U/L (ref 0–37)
Albumin: 3.5 g/dL (ref 3.5–5.2)
Alkaline Phosphatase: 77 U/L (ref 39–117)
BUN: 8 mg/dL (ref 6–23)
Chloride: 103 mEq/L (ref 96–112)
Potassium: 4.1 mEq/L (ref 3.5–5.1)
Total Bilirubin: 0.4 mg/dL (ref 0.3–1.2)

## 2013-05-26 LAB — CBC
MCH: 29 pg (ref 26.0–34.0)
MCV: 87.2 fL (ref 78.0–100.0)
Platelets: 295 10*3/uL (ref 150–400)
RDW: 14.4 % (ref 11.5–15.5)
WBC: 10.4 10*3/uL (ref 4.0–10.5)

## 2013-05-26 LAB — TSH: TSH: 0.641 u[IU]/mL (ref 0.350–4.500)

## 2013-05-26 NOTE — Progress Notes (Signed)
[facility] PROGRESS NOTE  Sonya James JYN:[id number] DOB: 02/16/1966 DOA: 05/25/2013 PCP: MUSE,ROCHELLE D., PA-C  Assessment/Plan: 1. Exacerbation of COPD/asthma with acute bronchitis. Patient is still short of breath and dyspneic on exertion. She does become mildly hypoxic with ambulation. We'll continue steroids and nebulizer treatments for another day. Continue antibiotics. Will likely be ready for discharge tomorrow 2. Thyromegaly. Thyroid studies were found to be normal. Further workup to be done as an outpatient. Consider thyroid ultrasound to be done as an outpatient. 3. Hypertension. Stable 4. Diabetes. New onset. Hemoglobin A1c can be elevated 7.5. Will likely need to start on metformin on discharge.  Code Status: Full code Family Communication: Discussed with patient and son Disposition Plan: Discharge home once improved   Consultants:  None  Procedures:  None  Antibiotics:  Rocephin 9/15  HPI/Subjective: Shortness of breath is improving. She is still coughing. She still admits to experiencing shortness of breath on exertion.  Objective: Filed Vitals:   05/26/13 1300  BP: 102/63  Pulse: 85  Temp: 98.6 F (37 C)  Resp: 19    Intake/Output Summary (Last 24 hours) at 05/26/13 1944 Last data filed at 05/26/13 1812  Gross per 24 hour  Intake 1226.67 ml  Output    325 ml  Net 901.67 ml   Filed Weights   05/25/13 1745 05/25/13 2249  Weight: 127.007 kg (280 lb) 130.9 kg (288 lb 9.3 oz)    Exam:   General:  No acute distress  Cardiovascular: S1, S2, regular rate and rhythm  Respiratory: Diminished breath sounds bilaterally with mild expiratory wheeze  Abdomen: Soft, nontender, positive bowel sounds  Musculoskeletal: No edema bilaterally   Data Reviewed: Basic Metabolic Panel:  Recent Labs Lab 05/25/13 1843 05/26/13 0543  NA 139 134*  K 3.8 4.1  CL 106 103  CO2  --  21  GLUCOSE 143* 281*  BUN 5* 8  CREATININE 0.90 0.88  CALCIUM   --  10.5   Liver Function Tests:  Recent Labs Lab 05/26/13 0543  AST 13  ALT 12  ALKPHOS 77  BILITOT 0.4  PROT 7.7  ALBUMIN 3.5   No results found for this basename: LIPASE, AMYLASE,  in the last 168 hours No results found for this basename: AMMONIA,  in the last 168 hours CBC:  Recent Labs Lab 05/25/13 1828 05/25/13 1843 05/26/13 0543  WBC 9.1  --  10.4  NEUTROABS 7.7  --   --   HGB 12.3 12.9 12.0  HCT 36.5 38.0 36.1  MCV 87.1  --  87.2  PLT 286  --  295   Cardiac Enzymes: No results found for this basename: CKTOTAL, CKMB, CKMBINDEX, TROPONINI,  in the last 168 hours BNP (last 3 results) No results found for this basename: PROBNP,  in the last 8760 hours CBG:  Recent Labs Lab 05/26/13 0806 05/26/13 1138 05/26/13 1627  GLUCAP 239* 262* 289*    No results found for this or any previous visit (from the past 240 hour(s)).   Studies: Dg Chest 2 View  05/25/2013   CLINICAL DATA:  Cough, fever and history of asthma.  EXAM: CHEST  2 VIEW  COMPARISON:  02/07/2010.  FINDINGS: The cardiac silhouette, mediastinal and hilar contours are within normal limits and stable. The lungs are clear. Minimal stable right basilar scarring changes. No pleural effusion. The bony thorax is intact.  IMPRESSION: No acute cardiopulmonary findings. Minimal streaky right basilar scarring changes are stable.   Electronically Signed   By:  Loralie Champagne M.D.   On: 05/25/2013 18:14    Scheduled Meds: . albuterol  2.5 mg Nebulization Q4H  . atorvastatin  80 mg Oral q1800  . cefTRIAXone (ROCEPHIN)  IV  1 g Intravenous Q24H  . enoxaparin (LOVENOX) injection  40 mg Subcutaneous Q24H  . furosemide  40 mg Oral Daily  . insulin aspart  0-15 Units Subcutaneous TID WC  . ipratropium  0.5 mg Nebulization Q4H  . lisinopril  10 mg Oral Daily  . methylPREDNISolone (SOLU-MEDROL) injection  80 mg Intravenous Q6H  . nicotine  14 mg Transdermal Daily  . pantoprazole  80 mg Oral Daily  . sodium chloride   3 mL Intravenous Q12H   Continuous Infusions:   Principal Problem:   Acute bronchitis Active Problems:   GERD (gastroesophageal reflux disease)   Fever   Asthma   COPD (chronic obstructive pulmonary disease)   Tobacco abuse   Obesity   HTN (hypertension), benign   Hypercholesteremia   Thyromegaly    Time spent:    [facility]  [facility] Pager [phone]. If 7PM-7AM, please contact night-coverage at [email or web address], password [email or web address] 05/26/2013, 7:44 PM  LOS: 1 day

## 2013-05-26 NOTE — Progress Notes (Addendum)
Nutrition Brief Note  Patient identified on the Malnutrition Screening Tool (MST) Report  Wt Readings from Last 15 Encounters:  05/25/13 288 lb 9.3 oz (130.9 kg)  11/08/12 280 lb (127.007 kg)  08/12/12 280 lb (127.007 kg)  08/16/11 271 lb (122.925 kg)  05/22/11 289 lb (131.09 kg)  05/22/11 289 lb (131.09 kg)  04/10/11 282 lb (127.914 kg)    Body mass index is 48.02 kg/(m^2). Patient meets criteria for extreme obesity class III based on current BMI.   Current diet order is Low Sodium.Labs and medications reviewed.   No nutrition interventions warranted at this time. If nutrition issues arise, please consult RD.   Royann Shivers MS,RD,LDN,CSG Office: [phone] Pager: [phone]

## 2013-05-26 NOTE — Care Management Note (Signed)
    Page 1 of 1   05/26/2013     4:45:43 PM   CARE MANAGEMENT NOTE 05/26/2013  Patient:  Sonya James, Sonya James   Account Number:  [id number]  Date Initiated:  05/26/2013  Documentation initiated by:  Rosemary Holms  Subjective/Objective Assessment:   Pt lives at home with her mother. No PCP, goes to the [facility] [place] [facility] To be DC'd and appt set up with [facility] on 05/30/13     Action/Plan:   Anticipated DC Date:  05/26/2013   Anticipated DC Plan:  HOME/SELF CARE      DC Planning Services  CM consult      Choice offered to / List presented to:             Status of service:  Completed, signed off Medicare Important Message given?   (If response is "NO", the following Medicare IM given date fields will be blank) Date Medicare IM given:   Date Additional Medicare IM given:    Discharge Disposition:    Per UR Regulation:    If discussed at Long Length of Stay Meetings, dates discussed:    Comments:  05/26/13 Rosemary Holms RN BSN CM

## 2013-05-26 NOTE — Progress Notes (Signed)
Inpatient Diabetes Program Recommendations  AACE/ADA: New Consensus Statement on Inpatient Glycemic Control (2013)  Target Ranges:  Prepandial:   less than 140 mg/dL      Peak postprandial:   less than 180 mg/dL (1-2 hours)      Critically ill patients:  140 - 180 mg/dL   Results for Sonya James, Sonya James (MRN [id number]) as of 05/26/2013 11:49  Ref. Range 05/25/2013 18:43 05/26/2013 05:43  Glucose Latest Range: 70-99 mg/dL 846 (H) 962 (H)   Results for Sonya James, Sonya James (MRN [id number]) as of 05/26/2013 11:49  Ref. Range 05/26/2013 08:06 05/26/2013 11:38  Glucose-Capillary Latest Range: 70-99 mg/dL 401 (H) 027 (H)   Inpatient Diabetes Program Recommendations Correction (SSI): Please consider increasing Novolog correction scale to Resistant scale and add bedtime correction scale. Diet: Please consider adding Carb Modified to current low sodium diet.  Note: Patient does not have a documented history of diabetes.  Patient is ordered to receive Solumedrol 80 mg Q6H which is contributing to hyperglycemia.  Initial lab glucose was 143 mg/dl (noted to be nonfasting).  An A1C has been ordered and there are not any previous values noted in the chart.  Currently, patient is ordered to receive Novolog 0-15 units AC for inpatient glycemic control.  Please increase Novolog correction scale to resistant scale and add bedtime Novolog correction.  Also, may want to add Carb Modified to current low sodium diet.  Will continue to follow.  Thanks, Orlando Penner, RN, MSN, CCRN Diabetes Coordinator Inpatient Diabetes Program [phone] (Team Pager) [phone] ([facility] office) [phone] [facility] office)

## 2013-05-26 NOTE — Progress Notes (Signed)
Patient got real SOB while walking in hallway. Oxygen sats were 89-90% on Room Air.

## 2013-05-27 DIAGNOSIS — E7800 Pure hypercholesterolemia, unspecified: Secondary | ICD-10-CM

## 2013-05-27 LAB — GLUCOSE, CAPILLARY: Glucose-Capillary: 301 mg/dL — ABNORMAL HIGH (ref 70–99)

## 2013-05-27 MED ORDER — NICOTINE 14 MG/24HR TD PT24: 1.0000 | MEDICATED_PATCH | Freq: Every day | TRANSDERMAL | Status: DC

## 2013-05-27 MED ORDER — ALBUTEROL SULFATE (5 MG/ML) 0.5% IN NEBU: 2.5000 mg | INHALATION_SOLUTION | RESPIRATORY_TRACT | Status: DC | PRN

## 2013-05-27 MED ORDER — AMOXICILLIN 500 MG PO TABS: 500.0000 mg | ORAL_TABLET | Freq: Two times a day (BID) | ORAL | Status: DC

## 2013-05-27 MED ORDER — PREDNISONE 10 MG PO TABS: ORAL_TABLET | ORAL | Status: DC

## 2013-05-27 MED ORDER — LIVING WELL WITH DIABETES BOOK
Freq: Once | Status: AC
  Administered 2013-05-27: 12:00:00
  Filled 2013-05-27: qty 1

## 2013-05-27 MED ORDER — METFORMIN HCL 500 MG PO TABS: 500.0000 mg | ORAL_TABLET | Freq: Two times a day (BID) | ORAL | Status: DC

## 2013-05-27 MED ORDER — FUROSEMIDE 40 MG PO TABS: 40.0000 mg | ORAL_TABLET | Freq: Every day | ORAL | Status: DC

## 2013-05-27 NOTE — Progress Notes (Addendum)
Inpatient Diabetes Program Recommendations  AACE/ADA: New Consensus Statement on Inpatient Glycemic Control (2013)  Target Ranges:  Prepandial:   less than 140 mg/dL      Peak postprandial:   less than 180 mg/dL (1-2 hours)      Critically ill patients:  140 - 180 mg/dL  Results for Sonya James, Sonya James (MRN [id number]) as of 05/27/2013 12:04  Ref. Range 05/26/2013 05:43  Hemoglobin A1C Latest Range: <5.7 % 7.5 (H)  Glucose Latest Range: 70-99 mg/dL 409 (H)   Results for Sonya James, Sonya James (MRN [id number]) as of 05/27/2013 12:04  Ref. Range 05/26/2013 08:06 05/26/2013 11:38 05/26/2013 16:27 05/26/2013 21:45 05/27/2013 07:38  Glucose-Capillary Latest Range: 70-99 mg/dL 956 (H) 213 (H) 086 (H) 287 (H) 261 (H)    Inpatient Diabetes Program Recommendations Correction (SSI): Please increase Novolog correction to resistant scale and add Novolog bedtime correction scale.   Note: Noted in progress note by Dr. Kerry Hough on 9/15 that patient will be diagnosed with new-onset DM and will likely be discharged on Metformin.  NRUSING: please educate patient on diabetes and have patient watch education videos on diabetes.  Blood glucose has ranged from 239-289 mg/dl over the past 24 hours.  Patient continues on Solumedrol 80 mg Q6H which is contributing to hyperglycemia.  Please increase Novolog correction to resistant scale and add Novolog bedtime correction scale.    At time of discharge, will need prescription for glucometer and testing supplies.  Will continue to follow.  Thanks, Orlando Penner, RN, MSN, CCRN Diabetes Coordinator Inpatient Diabetes Program [phone] (Team Pager) [phone] ([facility] office) [phone] [facility] office)

## 2013-05-27 NOTE — Progress Notes (Signed)
  RD consulted for nutrition education regarding diabetes. New Onset.  Lab Results  Component Value Date   HGBA1C 7.5* 05/26/2013    RD provided "Carbohydrate Counting for People with Diabetes" handout from the Academy of Nutrition and Dietetics. Discussed different food groups and their effects on blood sugar, emphasizing carbohydrate-containing foods. Provided list of carbohydrates and recommended serving sizes of common foods.  Discussed importance of controlled and consistent carbohydrate intake throughout the day. Provided examples of ways to balance meals/snacks and encouraged intake of high-fiber, whole grain complex carbohydrates. Teach back method used.  Expect good compliance. Pt provided contact for outpatient diabetes classes and she plans to attend.  Body mass index is 48.02 kg/(m^2). Pt meets criteria for extreme obesity class III based on current BMI.  Current diet order is Low Sodium/ CHO Modified, patient is consuming approximately 100% of meals at this time. Labs and medications reviewed. No further nutrition interventions warranted at this time. RD contact information provided. If additional nutrition issues arise, please re-consult RD.  Royann Shivers MS,RD,LDN,CSG Office: [phone] Pager: [phone]

## 2013-05-27 NOTE — Clinical Documentation Improvement (Signed)
THIS DOCUMENT IS NOT A PERMANENT PART OF THE MEDICAL RECORD  Please update your documentation with the medical record to reflect your response to this query. If you need help knowing how to do this please call [phone].  05/27/13  Dear Dr. Kerry Hough Marton Redwood,  A review of the patient medical record has revealed the following indicators.  Based on your clinical judgment, please clarify and document in a progress note and/or discharge summary the clinical condition associated with the following supporting information:  Admitted with COPD exacerbation/Asthma w/ acute bronchitis Chief complaints:  Productive cough, shortness of breath,  Not on home O2 Dyspnea on exertion. "Mildly hypoxic with ambulation" O2 sats 89-90 on room air History of COPD, Asthma, and Smoking  Treatment: Proventil nebs q4h & q2h prn Pulse Ox check with vital signs O2 @ 2L/m via [place]; keep sats greater than 92%   Possible Clinical Conditions?  Acute Respiratory Failure Acute on Chronic Respiratory Failure Chronic Respiratory Failure Other Condition________________ Cannot Clinically Determine                    You may use possible, probable, or suspect with inpatient documentation. possible, probable, suspected diagnoses MUST be documented at the time of discharge  Reviewed: additional documentation in the medical record  Thank Angelene Giovanni RN Clinical Documentation Specialist: [phone] [facility] Health Information Management [place]

## 2013-05-27 NOTE — Discharge Summary (Addendum)
Physician Discharge Summary  Sonya James ZOX:[id number] DOB: [birth year] DOA: 05/25/2013  PCP: Kizzie Furnish D., PA-C  Admit date: 05/25/2013 Discharge date: 05/27/2013  Time spent: 35 minutes  Recommendations for Outpatient Follow-up:  1. Follow up with primary care physician as scheduled  Discharge Diagnoses:  Principal Problem:   Acute bronchitis Active Problems:   GERD (gastroesophageal reflux disease)   Fever   Asthma   COPD (chronic obstructive pulmonary disease)   Tobacco abuse   Obesity   HTN (hypertension), benign   Hypercholesteremia   Thyromegaly   Type II or unspecified type diabetes mellitus without mention of complication, not stated as uncontrolled Acute respiratory Failure  Discharge Condition: improved  Diet recommendation: low salt, low carb  Filed Weights   05/25/13 1745 05/25/13 2249  Weight: 127.007 kg (280 lb) 130.9 kg (288 lb 9.3 oz)    History of present illness:  Sonya James is a 47 y.o. female with a past medical history of hypertension, asthma, COPD, hypercholesterolemia who continues to smoke cigarettes, and, who was in her usual state of health till about 3 days ago, when she started having a cough with shortness of breath. She had fever, although she did not check her temperature at home. She was coughing up greenish expectoration with some blood mixed in it. And, as a result of coughing spells she would have some vomiting as well. Denies any sick contacts. Denies any recent travel. Because of the cough and shortness of breath she's had some chest tightness. She has been dizzy and lightheaded. She has a headache due to the cough and fever. Headache is located all over the head. Denies any vision changes. She denies any focal weakness. She has been wheezing. She has felt weak overall. Since her symptoms were getting worse she decided to come in to the hospital.   Hospital Course:  This patient was admitted to the hospital with shortness of breath.  She has a history of tobacco use and COPD. It was felt that she had a COPD exacerbation with acute bronchitis. She was started on antibiotics as well as steroids. Gradually, she has improved. She feels back to baseline and is ready for discharge home. She's not had any fevers or other evidence of pneumonia. Will transition her steroids to prednisone and complete a course of oral antibiotics. She already has a nebulizer at home. The patient was noted to have elevated blood sugars. Hemoglobin A1c was found to be 7.5. She has been started on metformin and has been instructed to check her blood sugars daily. Follow up with primary care physician for further management  Procedures:  none  Consultations:  none  Discharge Exam: Filed Vitals:   05/27/13 1357  BP: 113/44  Pulse: 70  Temp: 98.1 F (36.7 C)  Resp: 20    General: NAD Cardiovascular: S1, S2 RRR Respiratory: diminshed breath sounds, no wheezing  Discharge Instructions  Discharge Orders   Future Orders Complete By Expires   Call MD for:  difficulty breathing, headache or visual disturbances  As directed    Call MD for:  temperature >100.4  As directed    Diet - low sodium heart healthy  As directed    Diet Carb Modified  As directed    Increase activity slowly  As directed        Medication List         albuterol (5 MG/ML) 0.5% nebulizer solution  Commonly known as:  PROVENTIL  Take 0.5 mLs (2.5 mg total)  by nebulization every 4 (four) hours as needed for wheezing or shortness of breath.     amoxicillin 500 MG tablet  Commonly known as:  AMOXIL  Take 1 tablet (500 mg total) by mouth 2 (two) times daily.     esomeprazole 40 MG capsule  Commonly known as:  NEXIUM  Take 40 mg by mouth every morning.     furosemide 40 MG tablet  Commonly known as:  LASIX  Take 1 tablet (40 mg total) by mouth daily.     lisinopril 10 MG tablet  Commonly known as:  PRINIVIL,ZESTRIL  Take 10 mg by mouth daily.     metFORMIN 500  MG tablet  Commonly known as:  GLUCOPHAGE  Take 1 tablet (500 mg total) by mouth 2 (two) times daily with a meal.     nicotine 14 mg/24hr patch  Commonly known as:  NICODERM CQ - dosed in mg/24 hours  Place 1 patch onto the skin daily.     potassium chloride 10 MEQ CR tablet  Commonly known as:  KLOR-CON  Take 10 mEq by mouth 2 (two) times daily.     predniSONE 10 MG tablet  Commonly known as:  DELTASONE  Take 40mg  po daily for 2 days, then 30mg  po daily for 2 days then 20mg  po daily for 2 days then 10mg  po daily for 2 days then stop     rosuvastatin 40 MG tablet  Commonly known as:  CRESTOR  Take 40 mg by mouth daily.       No Known Allergies     Follow-up Information   Follow up with Sonya James/ [facility] On 05/30/2013. (@ 10:30 Bring: form of ID, previous tax form or proof of income, 2 forms of proof of address (bank statement, phone bille.[name]))    Contact information:   [address] [place] [address]       The results of significant diagnostics from this hospitalization (including imaging, microbiology, ancillary and laboratory) are listed below for reference.    Significant Diagnostic Studies: Dg Chest 2 View  05/25/2013   CLINICAL DATA:  Cough, fever and history of asthma.  EXAM: CHEST  2 VIEW  COMPARISON:  02/07/2010.  FINDINGS: The cardiac silhouette, mediastinal and hilar contours are within normal limits and stable. The lungs are clear. Minimal stable right basilar scarring changes. No pleural effusion. The bony thorax is intact.  IMPRESSION: No acute cardiopulmonary findings. Minimal streaky right basilar scarring changes are stable.   Electronically Signed   By: Loralie Champagne M.D.   On: 05/25/2013 18:14    Microbiology: No results found for this or any previous visit (from the past 240 hour(s)).   Labs: Basic Metabolic Panel:  Recent Labs Lab 05/25/13 1843 05/26/13 0543  NA 139 134*  K 3.8 4.1  CL 106 103  CO2  --  21  GLUCOSE 143* 281*   BUN 5* 8  CREATININE 0.90 0.88  CALCIUM  --  10.5   Liver Function Tests:  Recent Labs Lab 05/26/13 0543  AST 13  ALT 12  ALKPHOS 77  BILITOT 0.4  PROT 7.7  ALBUMIN 3.5   No results found for this basename: LIPASE, AMYLASE,  in the last 168 hours No results found for this basename: AMMONIA,  in the last 168 hours CBC:  Recent Labs Lab 05/25/13 1828 05/25/13 1843 05/26/13 0543  WBC 9.1  --  10.4  NEUTROABS 7.7  --   --   HGB  12.3 12.9 12.0  HCT 36.5 38.0 36.1  MCV 87.1  --  87.2  PLT 286  --  295   Cardiac Enzymes: No results found for this basename: CKTOTAL, CKMB, CKMBINDEX, TROPONINI,  in the last 168 hours BNP: BNP (last 3 results) No results found for this basename: PROBNP,  in the last 8760 hours CBG:  Recent Labs Lab 05/26/13 0806 05/26/13 1138 05/26/13 1627 05/26/13 2145 05/27/13 0738  GLUCAP 239* 262* 289* 287* 261*       Signed:  [place]  [facility] 05/27/2013, 2:22 PM

## 2013-06-03 NOTE — Progress Notes (Signed)
UR Chart Review Completed  

## 2013-11-04 ENCOUNTER — Encounter (HOSPITAL_COMMUNITY): Payer: Self-pay | Admitting: Emergency Medicine

## 2013-11-04 ENCOUNTER — Emergency Department (HOSPITAL_COMMUNITY)
Admission: EM | Admit: 2013-11-04 | Discharge: 2013-11-04 | Disposition: A | Discharge status: Home / Self Care | Payer: Self-pay | Attending: Emergency Medicine | Admitting: Emergency Medicine

## 2013-11-04 DIAGNOSIS — M1990 Unspecified osteoarthritis, unspecified site: Secondary | ICD-10-CM | POA: Insufficient documentation

## 2013-11-04 DIAGNOSIS — J449 Chronic obstructive pulmonary disease, unspecified: Secondary | ICD-10-CM | POA: Insufficient documentation

## 2013-11-04 DIAGNOSIS — Z79899 Other long term (current) drug therapy: Secondary | ICD-10-CM | POA: Insufficient documentation

## 2013-11-04 DIAGNOSIS — R5383 Other fatigue: Secondary | ICD-10-CM

## 2013-11-04 DIAGNOSIS — Z791 Long term (current) use of non-steroidal anti-inflammatories (NSAID): Secondary | ICD-10-CM | POA: Insufficient documentation

## 2013-11-04 DIAGNOSIS — J4489 Other specified chronic obstructive pulmonary disease: Secondary | ICD-10-CM | POA: Insufficient documentation

## 2013-11-04 DIAGNOSIS — I10 Essential (primary) hypertension: Secondary | ICD-10-CM | POA: Insufficient documentation

## 2013-11-04 DIAGNOSIS — E7800 Pure hypercholesterolemia, unspecified: Secondary | ICD-10-CM | POA: Insufficient documentation

## 2013-11-04 DIAGNOSIS — N390 Urinary tract infection, site not specified: Secondary | ICD-10-CM | POA: Insufficient documentation

## 2013-11-04 DIAGNOSIS — Z792 Long term (current) use of antibiotics: Secondary | ICD-10-CM | POA: Insufficient documentation

## 2013-11-04 DIAGNOSIS — K219 Gastro-esophageal reflux disease without esophagitis: Secondary | ICD-10-CM | POA: Insufficient documentation

## 2013-11-04 DIAGNOSIS — R739 Hyperglycemia, unspecified: Secondary | ICD-10-CM

## 2013-11-04 DIAGNOSIS — R5381 Other malaise: Secondary | ICD-10-CM | POA: Insufficient documentation

## 2013-11-04 DIAGNOSIS — F17200 Nicotine dependence, unspecified, uncomplicated: Secondary | ICD-10-CM | POA: Insufficient documentation

## 2013-11-04 DIAGNOSIS — E119 Type 2 diabetes mellitus without complications: Secondary | ICD-10-CM | POA: Insufficient documentation

## 2013-11-04 HISTORY — DX: Type 2 diabetes mellitus without complications: E11.9

## 2013-11-04 LAB — CBC WITH DIFFERENTIAL/PLATELET
BASOS PCT: 1 % (ref 0–1)
Basophils Absolute: 0 10*3/uL (ref 0.0–0.1)
Eosinophils Absolute: 0.3 10*3/uL (ref 0.0–0.7)
Eosinophils Relative: 3 % (ref 0–5)
HCT: 37.5 % (ref 36.0–46.0)
HEMOGLOBIN: 12.9 g/dL (ref 12.0–15.0)
LYMPHS ABS: 2.8 10*3/uL (ref 0.7–4.0)
Lymphocytes Relative: 31 % (ref 12–46)
MCH: 28.6 pg (ref 26.0–34.0)
MCHC: 34.4 g/dL (ref 30.0–36.0)
MCV: 83.1 fL (ref 78.0–100.0)
MONOS PCT: 6 % (ref 3–12)
Monocytes Absolute: 0.5 10*3/uL (ref 0.1–1.0)
NEUTROS ABS: 5.2 10*3/uL (ref 1.7–7.7)
NEUTROS PCT: 59 % (ref 43–77)
PLATELETS: 340 10*3/uL (ref 150–400)
RBC: 4.51 MIL/uL (ref 3.87–5.11)
RDW: 13.1 % (ref 11.5–15.5)
WBC: 8.8 10*3/uL (ref 4.0–10.5)

## 2013-11-04 LAB — COMPREHENSIVE METABOLIC PANEL
ALBUMIN: 4.1 g/dL (ref 3.5–5.2)
ALK PHOS: 134 U/L — AB (ref 39–117)
ALT: 19 U/L (ref 0–35)
AST: 18 U/L (ref 0–37)
BILIRUBIN TOTAL: 0.2 mg/dL — AB (ref 0.3–1.2)
BUN: 13 mg/dL (ref 6–23)
CHLORIDE: 94 meq/L — AB (ref 96–112)
CO2: 26 mEq/L (ref 19–32)
Calcium: 10.9 mg/dL — ABNORMAL HIGH (ref 8.4–10.5)
Creatinine, Ser: 1.18 mg/dL — ABNORMAL HIGH (ref 0.50–1.10)
GFR calc Af Amer: 62 mL/min — ABNORMAL LOW (ref >90)
GFR calc non Af Amer: 54 mL/min — ABNORMAL LOW (ref >90)
GLUCOSE: 559 mg/dL — AB (ref 70–99)
POTASSIUM: 4.6 meq/L (ref 3.7–5.3)
SODIUM: 131 meq/L — AB (ref 137–147)
Total Protein: 8.4 g/dL — ABNORMAL HIGH (ref 6.0–8.3)

## 2013-11-04 LAB — URINALYSIS, ROUTINE W REFLEX MICROSCOPIC
BILIRUBIN URINE: NEGATIVE
KETONES UR: NEGATIVE mg/dL
LEUKOCYTES UA: NEGATIVE
Nitrite: NEGATIVE
PH: 6.5 (ref 5.0–8.0)
PROTEIN: 30 mg/dL — AB
Specific Gravity, Urine: 1.01 (ref 1.005–1.030)
Urobilinogen, UA: 0.2 mg/dL (ref 0.0–1.0)

## 2013-11-04 LAB — CBG MONITORING, ED
GLUCOSE-CAPILLARY: 460 mg/dL — AB (ref 70–99)
GLUCOSE-CAPILLARY: 371 mg/dL — AB (ref 70–99)
Glucose-Capillary: 496 mg/dL — ABNORMAL HIGH (ref 70–99)

## 2013-11-04 LAB — URINE MICROSCOPIC-ADD ON

## 2013-11-04 MED ORDER — CEPHALEXIN 500 MG PO CAPS
500.0000 mg | ORAL_CAPSULE | Freq: Once | ORAL | Status: AC
  Administered 2013-11-04: 500 mg via ORAL
  Filled 2013-11-04: qty 1

## 2013-11-04 MED ORDER — INSULIN ASPART 100 UNIT/ML IV SOLN
5.0000 [IU] | Freq: Once | INTRAVENOUS | Status: AC
  Administered 2013-11-04: 5 [IU] via INTRAVENOUS

## 2013-11-04 MED ORDER — SODIUM CHLORIDE 0.9 % IV BOLUS (SEPSIS)
1000.0000 mL | Freq: Once | INTRAVENOUS | Status: AC
  Administered 2013-11-04: 1000 mL via INTRAVENOUS

## 2013-11-04 MED ORDER — CEPHALEXIN 500 MG PO CAPS: 500.0000 mg | ORAL_CAPSULE | Freq: Four times a day (QID) | ORAL | Status: DC

## 2013-11-04 MED ORDER — INSULIN ASPART 100 UNIT/ML ~~LOC~~ SOLN
8.0000 [IU] | Freq: Once | SUBCUTANEOUS | Status: AC
  Administered 2013-11-04: 8 [IU] via SUBCUTANEOUS
  Filled 2013-11-04: qty 1

## 2013-11-04 NOTE — ED Notes (Signed)
abd pain,  Vomited x1, no diarrhea, no fever. CBG HI yesterday and today

## 2013-11-04 NOTE — Discharge Instructions (Signed)
Increase your metformin to 2 pills in the morning and 1 in the evening.  Check you blood sugar before meals and at bedtime.  Follow up with your md next week. Return sooner if problems

## 2013-11-04 NOTE — ED Notes (Signed)
CRITICAL VALUE ALERT  Critical value received: glucose 559  Date of notification:  11/04/13  Time of notification:  1812  Critical value read back: yes  Nurse who received alert:  Ilda Mori  MD notified (1st page):  zammit  Time of first page: 1812  MD notified (2nd page):  Time of second page:  Responding MD:  zammit  Time MD responded:  [phone]

## 2013-11-04 NOTE — ED Provider Notes (Signed)
CSN: [id number]     Arrival date & time 11/04/13  1646 History   First MD Initiated Contact with Patient 11/04/13 1658     Chief Complaint  Patient presents with  . Hyperglycemia     (Consider location/radiation/quality/duration/timing/severity/associated sxs/prior Treatment) Patient is a 48 y.o. female presenting with hyperglycemia. The history is provided by the patient (the pt states her sugars have been running high and she is weak).  Hyperglycemia Severity:  Moderate Onset quality:  Gradual Timing:  Constant Progression:  Unchanged Chronicity:  Recurrent Diabetes status:  Controlled with oral medications Context: not change in medication   Associated symptoms: fatigue   Associated symptoms: no abdominal pain and no chest pain     Past Medical History  Diagnosis Date  . HTN (hypertension)   . GERD (gastroesophageal reflux disease)   . Constipation   . Hypercholesterolemia   . Asthma   . COPD (chronic obstructive pulmonary disease)   . Shortness of breath   . Headache(784.0)   . Osteoarthritis   . Diabetes mellitus without complication    Past Surgical History  Procedure Laterality Date  . Cesarean section    . Breast reduction surgery    . Tubal ligation    . Esophagogastroduodenoscopy  05/22/2011    Procedure: ESOPHAGOGASTRODUODENOSCOPY (EGD);  Surgeon: Dorothyann Peng, MD;  Location: [facility];  Service: Endoscopy;  Laterality: N/A;  . Pancreatitis     Family History  Problem Relation Age of Onset  . Colon cancer Neg Hx   . Diabetes Mother    History  Substance Use Topics  . Smoking status: Current Every Day Smoker -- 1.00 packs/day for 22 years    Types: Cigarettes  . Smokeless tobacco: Current User  . Alcohol Use: No   OB History   Grav Para Term Preterm Abortions TAB SAB Ect Mult Living                 Review of Systems  Constitutional: Positive for fatigue. Negative for appetite change.  HENT: Negative for congestion, ear discharge and sinus  pressure.   Eyes: Negative for discharge.  Respiratory: Negative for cough.   Cardiovascular: Negative for chest pain.  Gastrointestinal: Negative for abdominal pain and diarrhea.  Genitourinary: Negative for frequency and hematuria.  Musculoskeletal: Negative for back pain.  Skin: Negative for rash.  Neurological: Negative for seizures and headaches.  Psychiatric/Behavioral: Negative for hallucinations.      Allergies  Review of patient's allergies indicates no known allergies.  Home Medications   Current Outpatient Rx  Name  Route  Sig  Dispense  Refill  . esomeprazole (NEXIUM) 40 MG capsule   Oral   Take 40 mg by mouth every morning.           . furosemide (LASIX) 40 MG tablet   Oral   Take 1 tablet (40 mg total) by mouth daily.   30 tablet   0   . ibuprofen (ADVIL,MOTRIN) 800 MG tablet   Oral   Take 800 mg by mouth 3 (three) times daily.         [name] lisinopril (PRINIVIL,ZESTRIL) 20 MG tablet   Oral   Take 20 mg by mouth daily.         . metFORMIN (GLUCOPHAGE) 500 MG tablet   Oral   Take 1 tablet (500 mg total) by mouth 2 (two) times daily with a meal.   60 tablet   0   . potassium chloride (KLOR-CON) 10 MEQ CR  tablet   Oral   Take 10 mEq by mouth 2 (two) times daily.           . rosuvastatin (CRESTOR) 40 MG tablet   Oral   Take 40 mg by mouth daily.         . cephALEXin (KEFLEX) 500 MG capsule   Oral   Take 1 capsule (500 mg total) by mouth 4 (four) times daily.   28 capsule   0    BP 131/76  Pulse 69  Temp(Src) 98.2 F (36.8 C) (Oral)  Resp 20  Ht 5\' 6"  (1.676 m)  Wt 289 lb (131.09 kg)  BMI 46.67 kg/m2  SpO2 100%  LMP 09/29/2013 Physical Exam  Constitutional: She is oriented to person, place, and time. She appears well-developed.  HENT:  Head: Normocephalic.  Eyes: Conjunctivae and EOM are normal. No scleral icterus.  Neck: Neck supple. No thyromegaly present.  Cardiovascular: Normal rate and regular rhythm.  Exam reveals no  gallop and no friction rub.   No murmur heard. Pulmonary/Chest: No stridor. She has no wheezes. She has no rales. She exhibits no tenderness.  Abdominal: She exhibits no distension. There is no tenderness. There is no rebound.  Musculoskeletal: Normal range of motion. She exhibits no edema.  Lymphadenopathy:    She has no cervical adenopathy.  Neurological: She is oriented to person, place, and time. She exhibits normal muscle tone. Coordination normal.  Skin: No rash noted. No erythema.  Psychiatric: She has a normal mood and affect. Her behavior is normal.    ED Course  Procedures (including critical care time) Labs Review Labs Reviewed  COMPREHENSIVE METABOLIC PANEL - Abnormal; Notable for the following:    Sodium 131 (*)    Chloride 94 (*)    Glucose, Bld 559 (*)    Creatinine, Ser 1.18 (*)    Calcium 10.9 (*)    Total Protein 8.4 (*)    Alkaline Phosphatase 134 (*)    Total Bilirubin 0.2 (*)    GFR calc non Af Amer 54 (*)    GFR calc Af Amer 62 (*)    All other components within normal limits  URINALYSIS, ROUTINE W REFLEX MICROSCOPIC - Abnormal; Notable for the following:    Color, Urine STRAW (*)    Glucose, UA >1000 (*)    Hgb urine dipstick LARGE (*)    Protein, ur 30 (*)    All other components within normal limits  URINE MICROSCOPIC-ADD ON - Abnormal; Notable for the following:    Squamous Epithelial / LPF MANY (*)    Bacteria, UA MANY (*)    All other components within normal limits  CBG MONITORING, ED - Abnormal; Notable for the following:    Glucose-Capillary 496 (*)    All other components within normal limits  CBG MONITORING, ED - Abnormal; Notable for the following:    Glucose-Capillary 460 (*)    All other components within normal limits  CBG MONITORING, ED - Abnormal; Notable for the following:    Glucose-Capillary 371 (*)    All other components within normal limits  CBC WITH DIFFERENTIAL   Imaging Review No results found.  EKG Interpretation    None       MDM   Final diagnoses:  Hyperglycemia  UTI (lower urinary tract infection)       Maudry Diego, MD 11/04/13 2027

## 2013-11-06 ENCOUNTER — Emergency Department (HOSPITAL_COMMUNITY)
Admission: EM | Admit: 2013-11-06 | Discharge: 2013-11-06 | Disposition: A | Discharge status: Home / Self Care | Payer: Self-pay | Attending: Emergency Medicine | Admitting: Emergency Medicine

## 2013-11-06 ENCOUNTER — Encounter (HOSPITAL_COMMUNITY): Payer: Self-pay | Admitting: Emergency Medicine

## 2013-11-06 DIAGNOSIS — E7800 Pure hypercholesterolemia, unspecified: Secondary | ICD-10-CM | POA: Insufficient documentation

## 2013-11-06 DIAGNOSIS — E119 Type 2 diabetes mellitus without complications: Secondary | ICD-10-CM | POA: Insufficient documentation

## 2013-11-06 DIAGNOSIS — Z79899 Other long term (current) drug therapy: Secondary | ICD-10-CM | POA: Insufficient documentation

## 2013-11-06 DIAGNOSIS — R5381 Other malaise: Secondary | ICD-10-CM | POA: Insufficient documentation

## 2013-11-06 DIAGNOSIS — R5383 Other fatigue: Secondary | ICD-10-CM

## 2013-11-06 DIAGNOSIS — H538 Other visual disturbances: Secondary | ICD-10-CM | POA: Insufficient documentation

## 2013-11-06 DIAGNOSIS — Z791 Long term (current) use of non-steroidal anti-inflammatories (NSAID): Secondary | ICD-10-CM | POA: Insufficient documentation

## 2013-11-06 DIAGNOSIS — E669 Obesity, unspecified: Secondary | ICD-10-CM | POA: Insufficient documentation

## 2013-11-06 DIAGNOSIS — M1990 Unspecified osteoarthritis, unspecified site: Secondary | ICD-10-CM | POA: Insufficient documentation

## 2013-11-06 DIAGNOSIS — J4489 Other specified chronic obstructive pulmonary disease: Secondary | ICD-10-CM | POA: Insufficient documentation

## 2013-11-06 DIAGNOSIS — R739 Hyperglycemia, unspecified: Secondary | ICD-10-CM

## 2013-11-06 DIAGNOSIS — F17200 Nicotine dependence, unspecified, uncomplicated: Secondary | ICD-10-CM | POA: Insufficient documentation

## 2013-11-06 DIAGNOSIS — R42 Dizziness and giddiness: Secondary | ICD-10-CM | POA: Insufficient documentation

## 2013-11-06 DIAGNOSIS — I10 Essential (primary) hypertension: Secondary | ICD-10-CM | POA: Insufficient documentation

## 2013-11-06 DIAGNOSIS — Z792 Long term (current) use of antibiotics: Secondary | ICD-10-CM | POA: Insufficient documentation

## 2013-11-06 DIAGNOSIS — J449 Chronic obstructive pulmonary disease, unspecified: Secondary | ICD-10-CM | POA: Insufficient documentation

## 2013-11-06 DIAGNOSIS — K219 Gastro-esophageal reflux disease without esophagitis: Secondary | ICD-10-CM | POA: Insufficient documentation

## 2013-11-06 LAB — CBC WITH DIFFERENTIAL/PLATELET
BASOS ABS: 0 10*3/uL (ref 0.0–0.1)
BASOS PCT: 0 % (ref 0–1)
EOS ABS: 0.2 10*3/uL (ref 0.0–0.7)
EOS PCT: 3 % (ref 0–5)
HCT: 38.6 % (ref 36.0–46.0)
Hemoglobin: 13.3 g/dL (ref 12.0–15.0)
LYMPHS ABS: 2.8 10*3/uL (ref 0.7–4.0)
Lymphocytes Relative: 31 % (ref 12–46)
MCH: 28.6 pg (ref 26.0–34.0)
MCHC: 34.5 g/dL (ref 30.0–36.0)
MCV: 83 fL (ref 78.0–100.0)
Monocytes Absolute: 0.5 10*3/uL (ref 0.1–1.0)
Monocytes Relative: 6 % (ref 3–12)
NEUTROS PCT: 60 % (ref 43–77)
Neutro Abs: 5.5 10*3/uL (ref 1.7–7.7)
PLATELETS: 323 10*3/uL (ref 150–400)
RBC: 4.65 MIL/uL (ref 3.87–5.11)
RDW: 13.3 % (ref 11.5–15.5)
WBC: 9 10*3/uL (ref 4.0–10.5)

## 2013-11-06 LAB — COMPREHENSIVE METABOLIC PANEL
ALBUMIN: 3.9 g/dL (ref 3.5–5.2)
ALK PHOS: 117 U/L (ref 39–117)
ALT: 23 U/L (ref 0–35)
AST: 18 U/L (ref 0–37)
BUN: 15 mg/dL (ref 6–23)
CALCIUM: 11.5 mg/dL — AB (ref 8.4–10.5)
CO2: 21 mEq/L (ref 19–32)
Chloride: 92 mEq/L — ABNORMAL LOW (ref 96–112)
Creatinine, Ser: 1.28 mg/dL — ABNORMAL HIGH (ref 0.50–1.10)
GFR calc non Af Amer: 49 mL/min — ABNORMAL LOW (ref >90)
GFR, EST AFRICAN AMERICAN: 56 mL/min — AB (ref >90)
GLUCOSE: 686 mg/dL — AB (ref 70–99)
POTASSIUM: 4.7 meq/L (ref 3.7–5.3)
SODIUM: 127 meq/L — AB (ref 137–147)
TOTAL PROTEIN: 8.1 g/dL (ref 6.0–8.3)
Total Bilirubin: 0.2 mg/dL — ABNORMAL LOW (ref 0.3–1.2)

## 2013-11-06 LAB — CBG MONITORING, ED
Glucose-Capillary: >600 mg/dL (ref 70–99)
Glucose-Capillary: 494 mg/dL — ABNORMAL HIGH (ref 70–99)
Glucose-Capillary: 431 mg/dL — ABNORMAL HIGH (ref 70–99)

## 2013-11-06 LAB — URINALYSIS, ROUTINE W REFLEX MICROSCOPIC
BILIRUBIN URINE: NEGATIVE
Ketones, ur: NEGATIVE mg/dL
LEUKOCYTES UA: NEGATIVE
NITRITE: NEGATIVE
PH: 5.5 (ref 5.0–8.0)
SPECIFIC GRAVITY, URINE: 1.01 (ref 1.005–1.030)
Urobilinogen, UA: 0.2 mg/dL (ref 0.0–1.0)

## 2013-11-06 LAB — URINE MICROSCOPIC-ADD ON

## 2013-11-06 MED ORDER — SODIUM CHLORIDE 0.9 % IV BOLUS (SEPSIS)
1000.0000 mL | Freq: Once | INTRAVENOUS | Status: AC
  Administered 2013-11-06: 1000 mL via INTRAVENOUS

## 2013-11-06 MED ORDER — GLIPIZIDE 10 MG PO TABS: 10.0000 mg | ORAL_TABLET | Freq: Every day | ORAL | Status: DC

## 2013-11-06 MED ORDER — INSULIN ASPART 100 UNIT/ML ~~LOC~~ SOLN
5.0000 [IU] | Freq: Once | SUBCUTANEOUS | Status: AC
  Administered 2013-11-06: 5 [IU] via INTRAVENOUS

## 2013-11-06 NOTE — Discharge Instructions (Signed)
Recommend increasing metformin 500 mg    2 tablets the morning and 2 tablets in the evening.   I will add a second medicine called Glipizide or Glucotrol 10 mg in the morning.   Recommend followup with an endocrinologist. Your primary care doctor can refer you. Resource guide given      Emergency Department Resource Guide 1) Find a Doctor and Pay Out of Pocket Although you won't have to find out who is covered by your insurance plan, it is a good idea to ask around and get recommendations. You will then need to call the office and see if the doctor you have chosen will accept you as a new patient and what types of options they offer for patients who are self-pay. Some doctors offer discounts or will set up payment plans for their patients who do not have insurance, but you will need to ask so you aren't surprised when you get to your appointment.  2) Contact Your Local Health Department Not all health departments have doctors that can see patients for sick visits, but many do, so it is worth a call to see if yours does. If you don't know where your local health department is, you can check in your phone book. The CDC also has a tool to help you locate your state's health department, and many state websites also have listings of all of their local health departments.  3) Find a [facility] If your illness is not likely to be very severe or complicated, you may want to try a walk in clinic. These are popping up all over the country in pharmacies, drugstores, and shopping centers. They're usually staffed by nurse practitioners or physician assistants that have been trained to treat common illnesses and complaints. They're usually fairly quick and inexpensive. However, if you have serious medical issues or chronic medical problems, these are probably not your best option.  No Primary Care Doctor: - Call Health Connect at  [phone] - they can help you locate a primary care doctor that  accepts your  insurance, provides certain services, etc. - Physician Referral Service- [phone]  Chronic Pain Problems: Organization         Address  Phone   Notes  [facility]  [phone] Patients need to be referred by their primary care doctor.   Medication Assistance: Organization         Address  Phone   Notes  [facility] Medication [facility] [address] [place], [place] [address] [phone] --Must be a resident of [facility] -- Must have NO insurance coverage whatsoever (no Medicaid/ Medicare, etc.) -- The pt. MUST have a primary care doctor that directs their care regularly and follows them in the community   [facility]  [phone]   [employer]  [phone]    Agencies that provide inexpensive medical care: Organization         Address  Phone   Notes  [place]  [phone]   [facility]    [phone]   [facility] [place], [place] [address] [phone]   [place] [address] [place] [phone]   Planned Parenthood    [phone]   [facility]    [phone]   [place] and [place], [place] Phone:  [phone], Fax:  [phone]  Hours of Operation:  9 am - 6 pm, M-F.  Also accepts Medicaid/Medicare and self-pay.  [facility] for [place], [address], [place] Phone: [phone], Fax: [phone]. Hours of Operation:  8:30 am - 5:30 pm, M-F.  Also accepts Medicaid and self-pay.  [facility] [address], [place] Phone: [phone]   [place], [place], [place] [phone], Ext. [phone] Mondays & Thursdays: 7-9 AM.  First 15 patients are seen on a first come, first serve basis.    [place] Providers:  Organization          Address  Phone   Notes  [facility] [address], [place] [phone] Also accepts self-pay patients.  [facility] [address], [place]  [phone]   [place], [address], [place] [phone]   [facility] [address] [place] [phone]   Lucianne Lei [address], [place]   [phone] Only accepts [place] Access [place] patients after they have their name applied to their card.   Self-Pay (no insurance) in [facility]:  Organization         Address  Phone   Notes  Sickle Cell Patients, [facility] [place] [phone]   [facility] [place] [phone]   [facility] [place]  [place], [place], [place] [phone]   [facility]/Dr. Osei-Bonsu  [address] [place] or [address], [place] [phone] Phone number for both [place] and [place] locations is the same.  [facility] [address] [place] [phone]   [facility] [address], [place] or [address] [phone]   [facility] [address], [place] [phone], phone; [phone], fax Sees patients 1st and 3rd Saturday of every month.  Must not qualify for public or private insurance (i.e. Medicaid, Medicare, [place] Health Choice, Veterans' Benefits)  Household income should be no more than 200% of the poverty level The clinic cannot treat you if you are pregnant or think you are pregnant  Sexually transmitted diseases are not treated at the clinic.    Dental Care: Organization         Address  Phone  Notes  [facility] Department of [facility] [phone] Accepts children up to age 63 who are enrolled in [place] or [place]; pregnant women with a Medicaid card; and children who have applied for Medicaid or [place] Health Choice, but were declined, whose parents can pay a reduced fee at time of service.  [facility] Department of [facility]  [address], [place] [phone] Accepts children up to age 67 who are enrolled in [place] or [place]; pregnant women with a Medicaid card; and children who have applied for Medicaid or [place] Health Choice, but were declined, whose parents can pay a reduced fee at time of service.  [facility] Adult [facility] Access PROGRAM  [place] [phone] Patients are seen by appointment only. Walk-ins are not accepted. [place] will  see patients 78 years of age and older. Monday - Tuesday (8am-5pm) Most Wednesdays (8:30-5pm) $30 per visit, cash only  [facility] Adult Dental Access PROGRAM  [address], [facility] [phone] Patients are seen by appointment only. Walk-ins are not accepted. [place] will see patients 19 years of age and older. One Wednesday Evening (Monthly: Volunteer Based).  $30 per visit, cash only  [place]  [phone] for adults; Children under age 77, call Graduate Pediatric Dentistry at [phone]. Children aged 59-14, please call [phone] to request a pediatric application.  Dental services are provided in all areas of dental care including fillings, crowns and bridges, complete and partial dentures, implants, gum treatment, root canals, and extractions. Preventive care is also provided. Treatment is provided to both adults and children. Patients are selected via a lottery and there is often a waiting list.   [facility] [address] [place]  [phone] [email or web address]   [facility] [address] [place], [place]  [phone], Ext. [phone] Second and Fourth Thursday of each month, opens at 6:30 AM; Clinic ends at 9 AM.  Patients are seen on a first-come first-served basis, and a limited number are seen during each clinic.   [facility]  [address] Hillard Danker [place], [place] [phone]   Eligibility Requirements You must have lived in [place], [place], or [place] counties for at least the last three months.   You cannot be eligible for state or federal sponsored [employer], including [employer], [place], or [employer].   You generally cannot be eligible for healthcare insurance through your employer.    How to apply: Eligibility screenings are held every Tuesday and Wednesday afternoon from 1:00 pm until 4:00 pm. You do not need an appointment for the interview!  [facility] [address] [place], [place]   [place]  [place] Department  [place]  [phone]    Behavioral Health Resources in the Community: Intensive Outpatient Programs Organization         Address  Phone  Notes  [facility] [place]. [address] [place], [place] [phone]   [facility] [address], [place], [place]   ADS: [facility] [address], [place], [place]   [place] [address]  [place], [place] or [phone]   Substance Abuse Resources Organization         Address  Phone  Notes  [facility] Drug Services  [phone]   [place]  [phone]   [facility] [place]   Chinita Pester  [phone]   Residential & Outpatient Substance Abuse Program  [phone]   Psychological Services Organization         Address  Phone  Notes  [facility]  [place]  [phone]    [place] [address], [place] or [phone]    Mobile Crisis Teams Organization         Address  Phone  Notes  [facility], Mobile Crisis Care Unit  [phone]   [facility]  [address] [place], [place]   [facility] [address] [place] [phone]    Self-Help/Support Groups Organization  Address  Phone             Notes  [place]. of [place] - variety of support groups  [place] Call for more information  Narcotics Anonymous (NA), [facility] [address], [employer] [place]  2 meetings at this location   Special educational needs teacher         Address  Phone  Notes  [facility] [place],    [place]  [phone]   [facility]  [address], [place] [phone], [place], [place]   [place], [place] [phone] Admissions: 8am-3pm M-F  Incentives Substance [place] [address]    [place], [place] [phone]   [facility] [address] [place], [place], [place]   [facility] [address],  [place], [place]   [facility] Programs - Intensive Outpatient [address] Kristeen [address], [place], [place]   [facility] ([place].,  [place], [place] [phone] or [phone]   [facility] (RTS) [address], [place], [place] Accepts Medicaid  [facility] [place] [address],  [place] [phone] Substance Abuse/Addiction Treatment   [facility] Organization         Address  Phone  Notes  [facility]  [phone]   Domenic Schwab, PhD [address] Arlis Port[place], [place]   [phone] or [phone]   [place]  [place], [place] [phone]   [facility] [address], [place], [place] [phone] Insurance/Medicaid/sponsorship through [facility] and Families [address], [address] [place]                                    [place], [place] [phone] [place] [address], [place] [phone]    Dr. Adele Schilder  [phone]   [facility] Dept. 1) [address] [place] 2) [place] 3)  [place] [address], [place] [phone]  [phone]   [place] [phone] or [phone] (After Hours)

## 2013-11-06 NOTE — ED Notes (Signed)
Pt reports CBG of over 500 at home. Pt was seen here earlier this week for same symptom and told to switch her metformin dosage and frequency. Pt states blood sugar has not decreased since.

## 2013-11-06 NOTE — ED Notes (Signed)
CBG in triage greater than 500.

## 2013-11-06 NOTE — ED Provider Notes (Signed)
CSN: [id number]     Arrival date & time 11/06/13  2011 History   First MD Initiated Contact with Patient 11/06/13 2025   This chart was scribed for Nat Christen, MD by Terressa Koyanagi, ED Scribe. This patient was seen in room APA14/APA14 and the patient's care was started at 8:42 PM.  Chief Complaint  Patient presents with  . Hyperglycemia   The history is provided by the patient. No language interpreter was used.   HPI Comments: Sonya James is a 48 y.o. female, with obesity, T2DM (diagnosed in August 2014), who presents to the Emergency Department complaining of hyperglycemia. Pt also complains of associated intermittent dizziness, fatigue and blurry vision. However, pt reports she is not having any symptoms currently. Pt has had CBGs of over 500, with a CBG of 525 an hour and a half ago. Pt states she was originally prescribed 500 mg of metformin 2x a day and later the Rx was increased to 500 mg metformin 3x a day. Pt confirms that she has been compliant with her Rx, however, her CBG continues to be in the 500s. Pt also has a history of HTN, COPD, and SOB.   PCP -- Phillips Grout   Past Medical History  Diagnosis Date  . HTN (hypertension)   . GERD (gastroesophageal reflux disease)   . Constipation   . Hypercholesterolemia   . Asthma   . COPD (chronic obstructive pulmonary disease)   . Shortness of breath   . Headache(784.0)   . Osteoarthritis   . Diabetes mellitus without complication    Past Surgical History  Procedure Laterality Date  . Cesarean section    . Breast reduction surgery    . Tubal ligation    . Esophagogastroduodenoscopy  05/22/2011    Procedure: ESOPHAGOGASTRODUODENOSCOPY (EGD);  Surgeon: Dorothyann Peng, MD;  Location: [facility];  Service: Endoscopy;  Laterality: N/A;  . Pancreatitis     Family History  Problem Relation Age of Onset  . Colon cancer Neg Hx   . Diabetes Mother    History  Substance Use Topics  . Smoking status: Current Every Day Smoker -- 1.00  packs/day for 22 years    Types: Cigarettes  . Smokeless tobacco: Current User  . Alcohol Use: No   OB History   Grav Para Term Preterm Abortions TAB SAB Ect Mult Living                 Review of Systems  Constitutional: Positive for fatigue.  Eyes: Positive for visual disturbance (Blurred vision).  Respiratory: Positive for chest tightness.   Neurological: Positive for dizziness.  All other systems reviewed and are negative.    Allergies  Review of patient's allergies indicates no known allergies.  Home Medications   Current Outpatient Rx  Name  Route  Sig  Dispense  Refill  . cephALEXin (KEFLEX) 500 MG capsule   Oral   Take 1 capsule (500 mg total) by mouth 4 (four) times daily.   28 capsule   0   . esomeprazole (NEXIUM) 40 MG capsule   Oral   Take 40 mg by mouth every morning.           . furosemide (LASIX) 40 MG tablet   Oral   Take 1 tablet (40 mg total) by mouth daily.   30 tablet   0   . glipiZIDE (GLUCOTROL) 10 MG tablet   Oral   Take 1 tablet (10 mg total) by mouth daily before breakfast.  30 tablet   0   . ibuprofen (ADVIL,MOTRIN) 800 MG tablet   Oral   Take 800 mg by mouth 3 (three) times daily.         [name] lisinopril (PRINIVIL,ZESTRIL) 20 MG tablet   Oral   Take 20 mg by mouth daily.         . metFORMIN (GLUCOPHAGE) 500 MG tablet   Oral   Take 1 tablet (500 mg total) by mouth 2 (two) times daily with a meal.   60 tablet   0   . potassium chloride (KLOR-CON) 10 MEQ CR tablet   Oral   Take 10 mEq by mouth 2 (two) times daily.           . rosuvastatin (CRESTOR) 40 MG tablet   Oral   Take 40 mg by mouth daily.          BP 149/68  Pulse 78  Temp(Src) 98.2 F (36.8 C) (Oral)  Resp 18  SpO2 98%  LMP 10/30/2013 Physical Exam  Nursing note and vitals reviewed. Constitutional: She is oriented to person, place, and time. She appears well-developed and well-nourished.  HENT:  Head: Normocephalic and atraumatic.  Eyes:  Conjunctivae and EOM are normal. Pupils are equal, round, and reactive to light.  Neck: Normal range of motion. Neck supple.  Cardiovascular: Normal rate, regular rhythm and normal heart sounds.   Pulmonary/Chest: Effort normal and breath sounds normal.  Abdominal: Soft. Bowel sounds are normal.  Obese  Musculoskeletal: Normal range of motion.  Neurological: She is alert and oriented to person, place, and time.  Skin: Skin is warm and dry.  Psychiatric: She has a normal mood and affect. Her behavior is normal.    ED Course  Procedures (including critical care time) DIAGNOSTIC STUDIES: Oxygen Saturation is 98% on RA, normal by my interpretation.    COORDINATION OF CARE: 8:50 PM-Discussed treatment plan which includes seeing an endocrinologist, adjustments to current meds, including increasing the dosage of metformin and adding a new medication to control the T2DM, with pt at bedside and pt agreed to plan.   Results for orders placed during the hospital encounter of 11/06/13  CBC WITH DIFFERENTIAL      Result Value Ref Range   WBC 9.0  4.0 - 10.5 K/uL   RBC 4.65  3.87 - 5.11 MIL/uL   Hemoglobin 13.3  12.0 - 15.0 g/dL   HCT 38.6  36.0 - 46.0 %   MCV 83.0  78.0 - 100.0 fL   MCH 28.6  26.0 - 34.0 pg   MCHC 34.5  30.0 - 36.0 g/dL   RDW 13.3  11.5 - 15.5 %   Platelets 323  150 - 400 K/uL   Neutrophils Relative % 60  43 - 77 %   Neutro Abs 5.5  1.7 - 7.7 K/uL   Lymphocytes Relative 31  12 - 46 %   Lymphs Abs 2.8  0.7 - 4.0 K/uL   Monocytes Relative 6  3 - 12 %   Monocytes Absolute 0.5  0.1 - 1.0 K/uL   Eosinophils Relative 3  0 - 5 %   Eosinophils Absolute 0.2  0.0 - 0.7 K/uL   Basophils Relative 0  0 - 1 %   Basophils Absolute 0.0  0.0 - 0.1 K/uL  COMPREHENSIVE METABOLIC PANEL      Result Value Ref Range   Sodium 127 (*) 137 - 147 mEq/L   Potassium 4.7  3.7 - 5.3 mEq/L   Chloride  92 (*) 96 - 112 mEq/L   CO2 21  19 - 32 mEq/L   Glucose, Bld 686 (*) 70 - 99 mg/dL   BUN 15  6 -  23 mg/dL   Creatinine, Ser 1.28 (*) 0.50 - 1.10 mg/dL   Calcium 11.5 (*) 8.4 - 10.5 mg/dL   Total Protein 8.1  6.0 - 8.3 g/dL   Albumin 3.9  3.5 - 5.2 g/dL   AST 18  0 - 37 U/L   ALT 23  0 - 35 U/L   Alkaline Phosphatase 117  39 - 117 U/L   Total Bilirubin 0.2 (*) 0.3 - 1.2 mg/dL   GFR calc non Af Amer 49 (*) >90 mL/min   GFR calc Af Amer 56 (*) >90 mL/min  URINALYSIS, ROUTINE W REFLEX MICROSCOPIC      Result Value Ref Range   Color, Urine YELLOW  YELLOW   APPearance CLEAR  CLEAR   Specific Gravity, Urine 1.010  1.005 - 1.030   pH 5.5  5.0 - 8.0   Glucose, UA >1000 (*) NEGATIVE mg/dL   Hgb urine dipstick MODERATE (*) NEGATIVE   Bilirubin Urine NEGATIVE  NEGATIVE   Ketones, ur NEGATIVE  NEGATIVE mg/dL   Protein, ur TRACE (*) NEGATIVE mg/dL   Urobilinogen, UA 0.2  0.0 - 1.0 mg/dL   Nitrite NEGATIVE  NEGATIVE   Leukocytes, UA NEGATIVE  NEGATIVE  URINE MICROSCOPIC-ADD ON      Result Value Ref Range   Squamous Epithelial / LPF FEW (*) RARE   WBC, UA 3-6  <3 WBC/hpf   RBC / HPF 3-6  <3 RBC/hpf   Bacteria, UA RARE  RARE  CBG MONITORING, ED      Result Value Ref Range   Glucose-Capillary >600 (*) 70 - 99 mg/dL  CBG MONITORING, ED      Result Value Ref Range   Glucose-Capillary 494 (*) 70 - 99 mg/dL  CBG MONITORING, ED      Result Value Ref Range   Glucose-Capillary 431 (*) 70 - 99 mg/dL   Comment 1 Documented in Chart     No results found.  EKG Interpretation   None       MDM   Final diagnoses:  Diabetes mellitus  Hyperglycemia    Patient is not in DKA. Glucose has reduced after IV fluids and insulin. Patient is a obese type II diabetic. Recommend increasing the metformin to 1000 mg twice a day. Will add glipizide or Glucotrol 10 mg daily. Patient encouraged to see endocrinologist.  I personally performed the services described in this documentation, which was scribed in my presence. The recorded information has been reviewed and is accurate.    Nat Christen,  MD 11/06/13 [phone]

## 2013-11-06 NOTE — ED Notes (Signed)
Pt states she was here a few days ago & they got her sugar down w/ insulin. Pt states taking meds & sugar is still up. Pt reports some nausea.

## 2013-11-06 NOTE — ED Notes (Signed)
Pt also reports generalized weakness, grogginess, headache, loss of appetite.

## 2013-11-06 NOTE — ED Notes (Signed)
Pt alert & oriented x4, stable gait. Patient given discharge instructions, paperwork & prescription(s). Patient  instructed to stop at the registration desk to finish any additional paperwork. Patient verbalized understanding. Pt left department w/ no further questions. 

## 2013-11-07 ENCOUNTER — Telehealth (HOSPITAL_COMMUNITY): Payer: Self-pay | Admitting: Dietician

## 2013-11-07 NOTE — Telephone Encounter (Signed)
Received referral via fax from Levada Dy at [facility]. Dx: diabetes. Appointment scheduled for 12/09/13 at 10:00 AM. Notified Levada Dy of appointment via fax, per her request.

## 2013-11-10 LAB — CBG MONITORING, ED: Glucose-Capillary: 553 mg/dL (ref 70–99)

## 2013-12-09 ENCOUNTER — Encounter (HOSPITAL_COMMUNITY): Payer: Self-pay | Admitting: Dietician

## 2013-12-09 NOTE — Progress Notes (Signed)
Outpatient Initial Nutrition Assessment  Date:12/09/2013   Appt Start Time: 0959  Referring Physician: TAPM Reason for Visit: diabetes  Nutrition Assessment:  Height: 5\' 5"  (165.1 cm)   Weight: 278 lb (126.1 kg)   IBW: 125#  %IBW: 222% UBW: 289#  %UBW: 96% Body mass index is 46.26 kg/(m^2). Meets criteria for obesity, class III.  Goal Weight: 250# (10% loss of current wt) Weight hx: She reports UBW of 289#. She reports approximately 1# (3.4%) wt loss x 6 weeks, due to dx of diabetes.   Wt Readings from Last 10 Encounters:  12/09/13 278 lb (126.1 kg)  11/04/13 289 lb (131.09 kg)  05/25/13 288 lb 9.3 oz (130.9 kg)  11/08/12 280 lb (127.007 kg)  08/12/12 280 lb (127.007 kg)  08/16/11 271 lb (122.925 kg)  05/22/11 289 lb (131.09 kg)  05/22/11 289 lb (131.09 kg)  04/10/11 282 lb (127.914 kg)    Estimated nutritional needs:  Kcals/ day:  1800-2000 Protein (grams)/day: 101-126  Fluid (L)/ day: 1.8-2.0   PMH:  Past Medical History  Diagnosis Date  . HTN (hypertension)   . GERD (gastroesophageal reflux disease)   . Constipation   . Hypercholesterolemia   . Asthma   . COPD (chronic obstructive pulmonary disease)   . Shortness of breath   . Headache(784.0)   . Osteoarthritis   . Diabetes mellitus without complication     Medications:  Current Outpatient Rx  Name  Route  Sig  Dispense  Refill  . ibuprofen (ADVIL,MOTRIN) 800 MG tablet   Oral   Take 800 mg by mouth 3 (three) times daily.         . insulin aspart (NOVOLOG) 100 UNIT/ML injection   Subcutaneous   Inject into the skin 3 (three) times daily before meals.         . insulin detemir (LEVEMIR) 100 UNIT/ML injection   Subcutaneous   Inject 25 Units into the skin at bedtime.         . metFORMIN (GLUCOPHAGE) 500 MG tablet   Oral   Take 1 tablet (500 mg total) by mouth 2 (two) times daily with a meal.   60 tablet   0   . cephALEXin (KEFLEX) 500 MG capsule   Oral   Take 1 capsule (500 mg total) by  mouth 4 (four) times daily.   28 capsule   0   . esomeprazole (NEXIUM) 40 MG capsule   Oral   Take 40 mg by mouth every morning.           . furosemide (LASIX) 40 MG tablet   Oral   Take 1 tablet (40 mg total) by mouth daily.   30 tablet   0   . glipiZIDE (GLUCOTROL) 10 MG tablet   Oral   Take 1 tablet (10 mg total) by mouth daily before breakfast.   30 tablet   0   . lisinopril (PRINIVIL,ZESTRIL) 20 MG tablet   Oral   Take 20 mg by mouth daily.         . potassium chloride (KLOR-CON) 10 MEQ CR tablet   Oral   Take 10 mEq by mouth 2 (two) times daily.           . rosuvastatin (CRESTOR) 40 MG tablet   Oral   Take 40 mg by mouth daily.           Labs: CMP     Component Value Date/Time   NA 127* 11/06/2013 2046   K  4.7 11/06/2013 2046   CL 92* 11/06/2013 2046   CO2 21 11/06/2013 2046   GLUCOSE 686* 11/06/2013 2046   BUN 15 11/06/2013 2046   CREATININE 1.28* 11/06/2013 2046   CALCIUM 11.5* 11/06/2013 2046   PROT 8.1 11/06/2013 2046   ALBUMIN 3.9 11/06/2013 2046   AST 18 11/06/2013 2046   ALT 23 11/06/2013 2046   ALKPHOS 117 11/06/2013 2046   BILITOT 0.2* 11/06/2013 2046   GFRNONAA 49* 11/06/2013 2046   GFRAA 56* 11/06/2013 2046    Lipid Panel  No results found for this basename: chol, trig, hdl, cholhdl, vldl, ldlcalc     Lab Results  Component Value Date   HGBA1C 7.5* 05/26/2013   Lab Results  Component Value Date   CREATININE 1.28* 11/06/2013     Lifestyle/ social habits: Ms. Leland resides in [place] Occupation: unemployed. She has no insurance. Physical activity: minimal. Pt reports she walks "sometimes". When questioned further, she reports that she walks about 10-15 minutes per day and she is limited by here asthma and COPD. She reports "don't expect me to do anymore than that". She is contemplating going to the [facility], however, she expresses concern about the $30 membership fee.   Nutrition hx/habits: Ms. Toste reports that she was recently diagnosed  with diabetes at her last PCP appointment. She reports frustration with this, as she states that "my records said I had it all along at the health department, but no one ever told me about that". She is currently taking 1000 mg Metformin BID, sliding scale Novolog TID, and 25 unit Levemir q HS. She reports that her blood sugars have been more controlled since starting on Insulin. She has not visited the endocrinologist yet.  CBGs typically range from 87-434. For the past month, number has ranged from 170-200 on average.   She reports frustration with her diet saying "I can't eat anything, they told me everything I eat turns to sugar". Meal pattern consists of 2 meals per day and one afternoon snack. She refuses to eat breakfast ("I can't eat like that at 8 in the morning"). She admits to "binge eating" on candy ("that makes my sugar go up"). She reports that she has recently started drinking low calorie beverages instead of regular soda.  Unfortunately, pt had to be redirected multiple times during visit. Visit was interrupted by multiple bathroom breaks (pt reports she is on a diuretic). Additionally, pt continued to look through magazine during interview. She reports "I've been through this before. I know what the plate and the plastic food is about".   Diet recall: Breakfast: skip; Lunch: eggs, cheese, diet soda; Dinner: [place] salad, with lettuce, carrots, cheese, tomato, and Caesar dressing; Snacks: candy or fruit; Beverages mainly consist of diet soda and coffee flavored with artificial sweetener.   Nutrition Diagnosis: Inconsistent carbohydrate intake r/t disordered eating pattern AEB diet recall, Hgb A1c: 7.3.  Nutrition Intervention: Nutrition rx: 1300-1500 Kcal NAS, diabetic diet; 3 meals per day; no snacks; low calorie beverages only; Physical activity 30 minutes daily  Education/Counseling Provided: Educated pt on principles of diabetic diet. Discussed carbohydrate metabolism in relation to  diabetes. Educated pt on basic self-management principles including: signs and symptoms of hyperglycemia and hypoglycemia, goals for fasting and postprandial blood sugars, goals for Hgb A1c, importance of checking feet, importance of keeping PCP appointments, and foot care. Educated pt on plate method, portion sizes, and sources of carbohydrate. Discussed importance of regular meal pattern. Discussed importance of adding sources of whole  grains to diet to improve glycemic control. Also encouraged to choose low fat dairy, lean meats, and whole fruits and vegetables more often. Discussed options of artificial sweeteners and encouraged pt to use which brand she liked best. Discussed nutritional content of foods commonly eaten and discussed healthier alternatives. Discussed importance of compliance to prevent further complications of disease. Educated pt on importance of physical activity (goal of at least 30 minutes 5 times per week) along with a healthy diet to achieve weight loss and glycemic goals. Encouraged slow, moderate weight loss of 1-2# per week, or 7-10% of current body weight. Provided "Diabetes and You" and "Carbohydrate Counting and Meal Planning" handouts. Used TeachBack to assess understanding.   Understanding, Motivation, Ability to Follow Recommendations: Expect fair to poor compliance.   Monitoring and Evaluation: Goals: 1) 0.2-2# wt loss per week; 2) Physical activity 30 minutes per day; 3) Hgb A1c < 7.0  Recommendations: 1) Continue checking blood sugars and following medication regimen; 2) Seek out assistance from [facility] or [place] if gym membership is desired; 3) Eat fruit, vegetables, or yogurt for snack instead of candy; 4) Break up physical activity into smaller, more frequent sessions  F/U: PRN. Pt has RD contact information.   Avondre Richens A. Jimmye Norman, RD, LDN 12/09/2013  Appt EndTime: 4818

## 2014-01-12 ENCOUNTER — Emergency Department (HOSPITAL_COMMUNITY)
Admission: EM | Admit: 2014-01-12 | Discharge: 2014-01-12 | Disposition: A | Discharge status: Home / Self Care | Payer: Self-pay | Attending: Emergency Medicine | Admitting: Emergency Medicine

## 2014-01-12 ENCOUNTER — Encounter (HOSPITAL_COMMUNITY): Payer: Self-pay | Admitting: Emergency Medicine

## 2014-01-12 DIAGNOSIS — J449 Chronic obstructive pulmonary disease, unspecified: Secondary | ICD-10-CM | POA: Insufficient documentation

## 2014-01-12 DIAGNOSIS — M1710 Unilateral primary osteoarthritis, unspecified knee: Secondary | ICD-10-CM | POA: Insufficient documentation

## 2014-01-12 DIAGNOSIS — Z791 Long term (current) use of non-steroidal anti-inflammatories (NSAID): Secondary | ICD-10-CM | POA: Insufficient documentation

## 2014-01-12 DIAGNOSIS — Z79899 Other long term (current) drug therapy: Secondary | ICD-10-CM | POA: Insufficient documentation

## 2014-01-12 DIAGNOSIS — F17200 Nicotine dependence, unspecified, uncomplicated: Secondary | ICD-10-CM | POA: Insufficient documentation

## 2014-01-12 DIAGNOSIS — IMO0002 Reserved for concepts with insufficient information to code with codable children: Principal | ICD-10-CM

## 2014-01-12 DIAGNOSIS — I10 Essential (primary) hypertension: Secondary | ICD-10-CM | POA: Insufficient documentation

## 2014-01-12 DIAGNOSIS — J4489 Other specified chronic obstructive pulmonary disease: Secondary | ICD-10-CM | POA: Insufficient documentation

## 2014-01-12 DIAGNOSIS — K219 Gastro-esophageal reflux disease without esophagitis: Secondary | ICD-10-CM | POA: Insufficient documentation

## 2014-01-12 DIAGNOSIS — Z794 Long term (current) use of insulin: Secondary | ICD-10-CM | POA: Insufficient documentation

## 2014-01-12 DIAGNOSIS — Z792 Long term (current) use of antibiotics: Secondary | ICD-10-CM | POA: Insufficient documentation

## 2014-01-12 DIAGNOSIS — M1990 Unspecified osteoarthritis, unspecified site: Secondary | ICD-10-CM | POA: Insufficient documentation

## 2014-01-12 DIAGNOSIS — E119 Type 2 diabetes mellitus without complications: Secondary | ICD-10-CM | POA: Insufficient documentation

## 2014-01-12 DIAGNOSIS — E7800 Pure hypercholesterolemia, unspecified: Secondary | ICD-10-CM | POA: Insufficient documentation

## 2014-01-12 MED ORDER — MELOXICAM 7.5 MG PO TABS: 7.5000 mg | ORAL_TABLET | Freq: Every day | ORAL | Status: DC

## 2014-01-12 NOTE — ED Notes (Signed)
Pain both knees and lower legs. Says she has arthritis.  No injury or fall

## 2014-01-12 NOTE — ED Provider Notes (Signed)
CSN: [id number]     Arrival date & time 01/12/14  1916 History   First MD Initiated Contact with Patient 01/12/14 1929     Chief Complaint  Patient presents with  . Knee Pain      HPI  Presents with bilateral knee pain "for a while". She states any pain for 10 years since she was worked a job where she was on her feet a lot. Her knees hurt a lot in the day and they brought. No falls no injuries or trauma. No hot or warm joints. No fevers no chills no rash no other signs of acute illness.  Past Medical History  Diagnosis Date  . HTN (hypertension)   . GERD (gastroesophageal reflux disease)   . Constipation   . Hypercholesterolemia   . Asthma   . COPD (chronic obstructive pulmonary disease)   . Shortness of breath   . Headache(784.0)   . Osteoarthritis   . Diabetes mellitus without complication    Past Surgical History  Procedure Laterality Date  . Cesarean section    . Breast reduction surgery    . Tubal ligation    . Esophagogastroduodenoscopy  05/22/2011    Procedure: ESOPHAGOGASTRODUODENOSCOPY (EGD);  Surgeon: Dorothyann Peng, MD;  Location: [facility];  Service: Endoscopy;  Laterality: N/A;  . Pancreatitis     Family History  Problem Relation Age of Onset  . Colon cancer Neg Hx   . Diabetes Mother    History  Substance Use Topics  . Smoking status: Current Every Day Smoker -- 1.00 packs/day for 22 years    Types: Cigarettes  . Smokeless tobacco: Current User  . Alcohol Use: No   OB History   Grav Para Term Preterm Abortions TAB SAB Ect Mult Living                 Review of Systems  Constitutional: Negative for fever, chills, diaphoresis, appetite change and fatigue.  HENT: Negative for mouth sores, sore throat and trouble swallowing.   Eyes: Negative for visual disturbance.  Respiratory: Negative for cough, chest tightness, shortness of breath and wheezing.   Cardiovascular: Negative for chest pain.  Gastrointestinal: Negative for nausea, vomiting,  abdominal pain, diarrhea and abdominal distention.  Endocrine: Negative for polydipsia, polyphagia and polyuria.  Genitourinary: Negative for dysuria, frequency and hematuria.  Musculoskeletal: Positive for arthralgias. Negative for gait problem and joint swelling.  Skin: Negative for color change, pallor and rash.  Neurological: Negative for dizziness, syncope, light-headedness and headaches.  Hematological: Does not bruise/bleed easily.  Psychiatric/Behavioral: Negative for behavioral problems and confusion.      Allergies  Review of patient's allergies indicates no known allergies.  Home Medications   Prior to Admission medications   Medication Sig Start Date End Date Taking? Authorizing Provider  cephALEXin (KEFLEX) 500 MG capsule Take 1 capsule (500 mg total) by mouth 4 (four) times daily. 11/04/13   Maudry Diego, MD  esomeprazole (NEXIUM) 40 MG capsule Take 40 mg by mouth every morning.      Historical Provider, MD  furosemide (LASIX) 40 MG tablet Take 1 tablet (40 mg total) by mouth daily. 05/27/13   Kathie Dike, MD  glipiZIDE (GLUCOTROL) 10 MG tablet Take 1 tablet (10 mg total) by mouth daily before breakfast. 11/06/13   Nat Christen, MD  ibuprofen (ADVIL,MOTRIN) 800 MG tablet Take 800 mg by mouth 3 (three) times daily.    Historical Provider, MD  insulin aspart (NOVOLOG) 100 UNIT/ML injection Inject into the  skin 3 (three) times daily before meals.    Historical Provider, MD  insulin detemir (LEVEMIR) 100 UNIT/ML injection Inject 25 Units into the skin at bedtime.    Historical Provider, MD  lisinopril (PRINIVIL,ZESTRIL) 20 MG tablet Take 20 mg by mouth daily.    Historical Provider, MD  meloxicam (MOBIC) 7.5 MG tablet Take 1 tablet (7.5 mg total) by mouth daily. 01/12/14   Tanna Furry, MD  metFORMIN (GLUCOPHAGE) 500 MG tablet Take 1 tablet (500 mg total) by mouth 2 (two) times daily with a meal. 05/27/13   Kathie Dike, MD  potassium chloride (KLOR-CON) 10 MEQ CR tablet Take 10  mEq by mouth 2 (two) times daily.      Historical Provider, MD  rosuvastatin (CRESTOR) 40 MG tablet Take 40 mg by mouth daily.    Historical Provider, MD   BP 142/68  Pulse 79  Temp(Src) 98.4 F (36.9 C) (Oral)  Resp 20  Ht 5\' 5"  (1.651 m)  Wt 280 lb (127.007 kg)  BMI 46.59 kg/m2  SpO2 98%  LMP 12/29/2013 Physical Exam  Constitutional: She is oriented to person, place, and time. She appears well-developed and well-nourished. No distress.  Short stature 280 pound black female.  HENT:  Head: Normocephalic.  Eyes: Conjunctivae are normal. Pupils are equal, round, and reactive to light. No scleral icterus.  Neck: Normal range of motion. Neck supple. No thyromegaly present.  Cardiovascular: Normal rate and regular rhythm.  Exam reveals no gallop and no friction rub.   No murmur heard. Pulmonary/Chest: Effort normal and breath sounds normal. No respiratory distress. She has no wheezes. She has no rales.  Abdominal: Soft. Bowel sounds are normal. She exhibits no distension. There is no tenderness. There is no rebound.  Musculoskeletal: Normal range of motion.       Legs: Neurological: She is alert and oriented to person, place, and time.  Skin: Skin is warm and dry. No rash noted.  Psychiatric: She has a normal mood and affect. Her behavior is normal.    ED Course  Procedures (including critical care time) Labs Review Labs Reviewed - No data to display  Imaging Review No results found.   EKG Interpretation None      MDM   Final diagnoses:  Arthritis   Joint is not warm. No palpable effusion. Healthy anti-inflammatories. Considering her diabetes and vascular use no more than 5 consecutive days without 5 days off. Followup with her primary care physician. Keep her weight under control. Again walking her pool exercise program to stay active. May ultimately require orthopedic referral for continued arthritis pain    Tanna Furry, MD 01/12/14 [phone]

## 2014-01-12 NOTE — Discharge Instructions (Signed)
Recheck with your primary care physician. If your pain continues you may need orthopedic referral. Stay active. Consider a walking program, or pool exercise. Do not take the [facility] prescription for more than 5 days in row. Take 5 days off of the medication before resuming. Arthritis, Nonspecific Arthritis is inflammation of a joint. This usually means pain, redness, warmth or swelling are present. One or more joints may be involved. There are a number of types of arthritis. Your caregiver may not be able to tell what type of arthritis you have right away. CAUSES  The most common cause of arthritis is the wear and tear on the joint (osteoarthritis). This causes damage to the cartilage, which can break down over time. The knees, hips, back and neck are most often affected by this type of arthritis. Other types of arthritis and common causes of joint pain include:  Sprains and other injuries near the joint. Sometimes minor sprains and injuries cause pain and swelling that develop hours later.  Rheumatoid arthritis. This affects hands, feet and knees. It usually affects both sides of your body at the same time. It is often associated with chronic ailments, fever, weight loss and general weakness.  Crystal arthritis. Gout and pseudo gout can cause occasional acute severe pain, redness and swelling in the foot, ankle, or knee.  Infectious arthritis. Bacteria can get into a joint through a break in overlying skin. This can cause infection of the joint. Bacteria and viruses can also spread through the blood and affect your joints.  Drug, infectious and allergy reactions. Sometimes joints can become mildly painful and slightly swollen with these types of illnesses. SYMPTOMS   Pain is the main symptom.  Your joint or joints can also be red, swollen and warm or hot to the touch.  You may have a fever with certain types of arthritis, or even feel overall ill.  The joint with arthritis will hurt  with movement. Stiffness is present with some types of arthritis. DIAGNOSIS  Your caregiver will suspect arthritis based on your description of your symptoms and on your exam. Testing may be needed to find the type of arthritis:  Blood and sometimes urine tests.  X-ray tests and sometimes CT or MRI scans.  Removal of fluid from the joint (arthrocentesis) is done to check for bacteria, crystals or other causes. Your caregiver (or a specialist) will numb the area over the joint with a local anesthetic, and use a needle to remove joint fluid for examination. This procedure is only minimally uncomfortable.  Even with these tests, your caregiver may not be able to tell what kind of arthritis you have. Consultation with a specialist (rheumatologist) may be helpful. TREATMENT  Your caregiver will discuss with you treatment specific to your type of arthritis. If the specific type cannot be determined, then the following general recommendations may apply. Treatment of severe joint pain includes:  Rest.  Elevation.  Anti-inflammatory medication (for example, ibuprofen) may be prescribed. Avoiding activities that cause increased pain.  Only take over-the-counter or prescription medicines for pain and discomfort as recommended by your caregiver.  Cold packs over an inflamed joint may be used for 10 to 15 minutes every hour. Hot packs sometimes feel better, but do not use overnight. Do not use hot packs if you are diabetic without your caregiver's permission.  A cortisone shot into arthritic joints may help reduce pain and swelling.  Any acute arthritis that gets worse over the next 1 to 2 days needs  to be looked at to be sure there is no joint infection. Long-term arthritis treatment involves modifying activities and lifestyle to reduce joint stress jarring. This can include weight loss. Also, exercise is needed to nourish the joint cartilage and remove waste. This helps keep the muscles around the  joint strong. HOME CARE INSTRUCTIONS   Do not take aspirin to relieve pain if gout is suspected. This elevates uric acid levels.  Only take over-the-counter or prescription medicines for pain, discomfort or fever as directed by your caregiver.  Rest the joint as much as possible.  If your joint is swollen, keep it elevated.  Use crutches if the painful joint is in your leg.  Drinking plenty of fluids may help for certain types of arthritis.  Follow your caregiver's dietary instructions.  Try low-impact exercise such as:  Swimming.  Water aerobics.  Biking.  Walking.  Morning stiffness is often relieved by a warm shower.  Put your joints through regular range-of-motion. SEEK MEDICAL CARE IF:   You do not feel better in 24 hours or are getting worse.  You have side effects to medications, or are not getting better with treatment. SEEK IMMEDIATE MEDICAL CARE IF:   You have a fever.  You develop severe joint pain, swelling or redness.  Many joints are involved and become painful and swollen.  There is severe back pain and/or leg weakness.  You have loss of bowel or bladder control. Document Released: 10/05/2004 Document Revised: 11/20/2011 Document Reviewed: 10/21/2008 [facility] Patient Information 2014 [place].

## 2014-07-03 ENCOUNTER — Inpatient Hospital Stay (HOSPITAL_COMMUNITY)
Admission: EM | Admit: 2014-07-03 | Discharge: 2014-07-05 | DRG: 192 | Disposition: A | Discharge status: Home / Self Care | Payer: Self-pay | Attending: Internal Medicine | Admitting: Internal Medicine

## 2014-07-03 ENCOUNTER — Encounter (HOSPITAL_COMMUNITY): Payer: Self-pay | Admitting: Emergency Medicine

## 2014-07-03 DIAGNOSIS — M1990 Unspecified osteoarthritis, unspecified site: Secondary | ICD-10-CM | POA: Diagnosis present

## 2014-07-03 DIAGNOSIS — E7800 Pure hypercholesterolemia, unspecified: Secondary | ICD-10-CM

## 2014-07-03 DIAGNOSIS — E785 Hyperlipidemia, unspecified: Secondary | ICD-10-CM | POA: Diagnosis present

## 2014-07-03 DIAGNOSIS — Z792 Long term (current) use of antibiotics: Secondary | ICD-10-CM

## 2014-07-03 DIAGNOSIS — Z791 Long term (current) use of non-steroidal anti-inflammatories (NSAID): Secondary | ICD-10-CM

## 2014-07-03 DIAGNOSIS — F17210 Nicotine dependence, cigarettes, uncomplicated: Secondary | ICD-10-CM | POA: Diagnosis present

## 2014-07-03 DIAGNOSIS — E1165 Type 2 diabetes mellitus with hyperglycemia: Secondary | ICD-10-CM | POA: Diagnosis present

## 2014-07-03 DIAGNOSIS — Z720 Tobacco use: Secondary | ICD-10-CM

## 2014-07-03 DIAGNOSIS — R9431 Abnormal electrocardiogram [ECG] [EKG]: Secondary | ICD-10-CM

## 2014-07-03 DIAGNOSIS — R600 Localized edema: Secondary | ICD-10-CM

## 2014-07-03 DIAGNOSIS — K219 Gastro-esophageal reflux disease without esophagitis: Secondary | ICD-10-CM

## 2014-07-03 DIAGNOSIS — Z833 Family history of diabetes mellitus: Secondary | ICD-10-CM

## 2014-07-03 DIAGNOSIS — I10 Essential (primary) hypertension: Secondary | ICD-10-CM

## 2014-07-03 DIAGNOSIS — Z23 Encounter for immunization: Secondary | ICD-10-CM

## 2014-07-03 DIAGNOSIS — R059 Cough, unspecified: Secondary | ICD-10-CM

## 2014-07-03 DIAGNOSIS — J209 Acute bronchitis, unspecified: Secondary | ICD-10-CM | POA: Diagnosis present

## 2014-07-03 DIAGNOSIS — J45909 Unspecified asthma, uncomplicated: Secondary | ICD-10-CM | POA: Diagnosis present

## 2014-07-03 DIAGNOSIS — T380X5A Adverse effect of glucocorticoids and synthetic analogues, initial encounter: Secondary | ICD-10-CM | POA: Diagnosis present

## 2014-07-03 DIAGNOSIS — J441 Chronic obstructive pulmonary disease with (acute) exacerbation: Principal | ICD-10-CM

## 2014-07-03 DIAGNOSIS — Z794 Long term (current) use of insulin: Secondary | ICD-10-CM

## 2014-07-03 DIAGNOSIS — R05 Cough: Secondary | ICD-10-CM

## 2014-07-03 NOTE — ED Notes (Signed)
Patient c/o cough x 10 days; states has been taking OTC medications without relief.

## 2014-07-04 ENCOUNTER — Encounter (HOSPITAL_COMMUNITY): Payer: Self-pay | Admitting: *Deleted

## 2014-07-04 ENCOUNTER — Emergency Department (HOSPITAL_COMMUNITY): Payer: Self-pay

## 2014-07-04 DIAGNOSIS — I10 Essential (primary) hypertension: Secondary | ICD-10-CM

## 2014-07-04 DIAGNOSIS — E780 Pure hypercholesterolemia: Secondary | ICD-10-CM

## 2014-07-04 DIAGNOSIS — Z720 Tobacco use: Secondary | ICD-10-CM

## 2014-07-04 DIAGNOSIS — I517 Cardiomegaly: Secondary | ICD-10-CM

## 2014-07-04 DIAGNOSIS — R600 Localized edema: Secondary | ICD-10-CM

## 2014-07-04 DIAGNOSIS — J441 Chronic obstructive pulmonary disease with (acute) exacerbation: Principal | ICD-10-CM

## 2014-07-04 DIAGNOSIS — R05 Cough: Secondary | ICD-10-CM

## 2014-07-04 LAB — TROPONIN I
Troponin I: <0.3 ng/mL (ref <0.30)
Troponin I: <0.3 ng/mL (ref <0.30)

## 2014-07-04 LAB — CBC WITH DIFFERENTIAL/PLATELET
BASOS PCT: 0 % (ref 0–1)
Basophils Absolute: 0 10*3/uL (ref 0.0–0.1)
EOS ABS: 0.4 10*3/uL (ref 0.0–0.7)
EOS PCT: 4 % (ref 0–5)
HCT: 32.4 % — ABNORMAL LOW (ref 36.0–46.0)
Hemoglobin: 10.6 g/dL — ABNORMAL LOW (ref 12.0–15.0)
LYMPHS ABS: 2.9 10*3/uL (ref 0.7–4.0)
Lymphocytes Relative: 33 % (ref 12–46)
MCH: 26.6 pg (ref 26.0–34.0)
MCHC: 32.7 g/dL (ref 30.0–36.0)
MCV: 81.4 fL (ref 78.0–100.0)
Monocytes Absolute: 0.7 10*3/uL (ref 0.1–1.0)
Monocytes Relative: 7 % (ref 3–12)
Neutro Abs: 5 10*3/uL (ref 1.7–7.7)
Neutrophils Relative %: 56 % (ref 43–77)
Platelets: 325 10*3/uL (ref 150–400)
RBC: 3.98 MIL/uL (ref 3.87–5.11)
RDW: 16 % — ABNORMAL HIGH (ref 11.5–15.5)
WBC: 9 10*3/uL (ref 4.0–10.5)

## 2014-07-04 LAB — GLUCOSE, CAPILLARY
GLUCOSE-CAPILLARY: 283 mg/dL — AB (ref 70–99)
GLUCOSE-CAPILLARY: 270 mg/dL — AB (ref 70–99)
Glucose-Capillary: 230 mg/dL — ABNORMAL HIGH (ref 70–99)
Glucose-Capillary: 348 mg/dL — ABNORMAL HIGH (ref 70–99)

## 2014-07-04 LAB — BASIC METABOLIC PANEL
Anion gap: 11 (ref 5–15)
BUN: 10 mg/dL (ref 6–23)
CALCIUM: 9.7 mg/dL (ref 8.4–10.5)
CO2: 25 mEq/L (ref 19–32)
Chloride: 101 mEq/L (ref 96–112)
Creatinine, Ser: 1.07 mg/dL (ref 0.50–1.10)
GFR calc Af Amer: 70 mL/min — ABNORMAL LOW (ref >90)
GFR, EST NON AFRICAN AMERICAN: 60 mL/min — AB (ref >90)
GLUCOSE: 108 mg/dL — AB (ref 70–99)
Potassium: 4 mEq/L (ref 3.7–5.3)
SODIUM: 137 meq/L (ref 137–147)

## 2014-07-04 LAB — PRO B NATRIURETIC PEPTIDE: Pro B Natriuretic peptide (BNP): 98.3 pg/mL (ref 0–125)

## 2014-07-04 MED ORDER — ASPIRIN 81 MG PO CHEW
324.0000 mg | CHEWABLE_TABLET | Freq: Once | ORAL | Status: AC
  Administered 2014-07-04: 324 mg via ORAL
  Filled 2014-07-04: qty 4

## 2014-07-04 MED ORDER — ALBUTEROL (5 MG/ML) CONTINUOUS INHALATION SOLN
10.0000 mg/h | INHALATION_SOLUTION | Freq: Once | RESPIRATORY_TRACT | Status: AC
  Administered 2014-07-04: 10 mg/h via RESPIRATORY_TRACT
  Filled 2014-07-04: qty 20

## 2014-07-04 MED ORDER — PANTOPRAZOLE SODIUM 40 MG PO TBEC
40.0000 mg | DELAYED_RELEASE_TABLET | Freq: Every day | ORAL | Status: DC
  Administered 2014-07-04 – 2014-07-05 (×2): 40 mg via ORAL
  Filled 2014-07-04 (×2): qty 1

## 2014-07-04 MED ORDER — INFLUENZA VAC SPLIT QUAD 0.5 ML IM SUSY
0.5000 mL | PREFILLED_SYRINGE | Freq: Once | INTRAMUSCULAR | Status: AC
  Administered 2014-07-04: 0.5 mL via INTRAMUSCULAR
  Filled 2014-07-04: qty 0.5

## 2014-07-04 MED ORDER — LEVOFLOXACIN IN D5W 500 MG/100ML IV SOLN
500.0000 mg | INTRAVENOUS | Status: DC
  Administered 2014-07-04 – 2014-07-05 (×2): 500 mg via INTRAVENOUS
  Filled 2014-07-04 (×3): qty 100

## 2014-07-04 MED ORDER — IPRATROPIUM-ALBUTEROL 0.5-2.5 (3) MG/3ML IN SOLN
3.0000 mL | Freq: Four times a day (QID) | RESPIRATORY_TRACT | Status: DC
  Administered 2014-07-04 (×3): 3 mL via RESPIRATORY_TRACT
  Filled 2014-07-04 (×3): qty 3

## 2014-07-04 MED ORDER — ALBUTEROL SULFATE (2.5 MG/3ML) 0.083% IN NEBU
2.5000 mg | INHALATION_SOLUTION | RESPIRATORY_TRACT | Status: DC | PRN
Start: 2014-07-04 — End: 2014-07-05

## 2014-07-04 MED ORDER — ROSUVASTATIN CALCIUM 20 MG PO TABS
40.0000 mg | ORAL_TABLET | Freq: Every day | ORAL | Status: DC
  Administered 2014-07-04 – 2014-07-05 (×2): 40 mg via ORAL
  Filled 2014-07-04 (×2): qty 2

## 2014-07-04 MED ORDER — ACETAMINOPHEN 325 MG PO TABS: 650.0000 mg | ORAL_TABLET | Freq: Four times a day (QID) | ORAL | Status: DC | PRN

## 2014-07-04 MED ORDER — INSULIN DETEMIR 100 UNIT/ML ~~LOC~~ SOLN
25.0000 [IU] | Freq: Every day | SUBCUTANEOUS | Status: DC
  Administered 2014-07-04: 25 [IU] via SUBCUTANEOUS
  Filled 2014-07-04 (×2): qty 0.25

## 2014-07-04 MED ORDER — PREDNISONE 50 MG PO TABS
60.0000 mg | ORAL_TABLET | Freq: Once | ORAL | Status: AC
  Administered 2014-07-04: 60 mg via ORAL
  Filled 2014-07-04 (×2): qty 1

## 2014-07-04 MED ORDER — ENOXAPARIN SODIUM 40 MG/0.4ML ~~LOC~~ SOLN
40.0000 mg | SUBCUTANEOUS | Status: DC
  Administered 2014-07-04 – 2014-07-05 (×2): 40 mg via SUBCUTANEOUS
  Filled 2014-07-04 (×2): qty 0.4

## 2014-07-04 MED ORDER — SODIUM CHLORIDE 0.9 % IJ SOLN
3.0000 mL | Freq: Two times a day (BID) | INTRAMUSCULAR | Status: DC
  Administered 2014-07-04 (×2): 3 mL via INTRAVENOUS

## 2014-07-04 MED ORDER — GUAIFENESIN ER 600 MG PO TB12
600.0000 mg | ORAL_TABLET | Freq: Two times a day (BID) | ORAL | Status: DC
  Administered 2014-07-04 – 2014-07-05 (×3): 600 mg via ORAL
  Filled 2014-07-04 (×3): qty 1

## 2014-07-04 MED ORDER — FUROSEMIDE 40 MG PO TABS
40.0000 mg | ORAL_TABLET | Freq: Every day | ORAL | Status: DC
  Administered 2014-07-04 – 2014-07-05 (×2): 40 mg via ORAL
  Filled 2014-07-04 (×2): qty 1

## 2014-07-04 MED ORDER — INSULIN ASPART 100 UNIT/ML ~~LOC~~ SOLN
0.0000 [IU] | Freq: Three times a day (TID) | SUBCUTANEOUS | Status: DC
  Administered 2014-07-04: 3 [IU] via SUBCUTANEOUS
  Administered 2014-07-04: 9 [IU] via SUBCUTANEOUS
  Administered 2014-07-04: 5 [IU] via SUBCUTANEOUS
  Administered 2014-07-05: 7 [IU] via SUBCUTANEOUS
  Administered 2014-07-05: 9 [IU] via SUBCUTANEOUS

## 2014-07-04 MED ORDER — METHYLPREDNISOLONE SODIUM SUCC 125 MG IJ SOLR
60.0000 mg | Freq: Four times a day (QID) | INTRAMUSCULAR | Status: DC
  Administered 2014-07-04 – 2014-07-05 (×5): 60 mg via INTRAVENOUS
  Filled 2014-07-04 (×5): qty 2

## 2014-07-04 MED ORDER — ACETAMINOPHEN 650 MG RE SUPP: 650.0000 mg | Freq: Four times a day (QID) | RECTAL | Status: DC | PRN

## 2014-07-04 MED ORDER — IPRATROPIUM BROMIDE 0.02 % IN SOLN
0.5000 mg | Freq: Once | RESPIRATORY_TRACT | Status: AC
  Administered 2014-07-04: 0.5 mg via RESPIRATORY_TRACT
  Filled 2014-07-04: qty 2.5

## 2014-07-04 MED ORDER — ASPIRIN 81 MG PO CHEW
CHEWABLE_TABLET | ORAL | Status: AC
  Administered 2014-07-04: 01:00:00
  Filled 2014-07-04: qty 1

## 2014-07-04 MED ORDER — MELOXICAM 7.5 MG PO TABS
7.5000 mg | ORAL_TABLET | Freq: Every day | ORAL | Status: DC
  Administered 2014-07-04 – 2014-07-05 (×2): 7.5 mg via ORAL
  Filled 2014-07-04 (×3): qty 1

## 2014-07-04 MED ORDER — POTASSIUM CHLORIDE CRYS ER 10 MEQ PO TBCR
10.0000 meq | EXTENDED_RELEASE_TABLET | Freq: Two times a day (BID) | ORAL | Status: DC
  Administered 2014-07-04 – 2014-07-05 (×3): 10 meq via ORAL
  Filled 2014-07-04 (×8): qty 1

## 2014-07-04 MED ORDER — SODIUM CHLORIDE 0.9 % IV SOLN: 250.0000 mL | INTRAVENOUS | Status: DC | PRN

## 2014-07-04 MED ORDER — ALBUTEROL SULFATE (2.5 MG/3ML) 0.083% IN NEBU
5.0000 mg | INHALATION_SOLUTION | Freq: Once | RESPIRATORY_TRACT | Status: AC
  Administered 2014-07-04: 5 mg via RESPIRATORY_TRACT
  Filled 2014-07-04: qty 6

## 2014-07-04 MED ORDER — IPRATROPIUM BROMIDE 0.02 % IN SOLN: 0.5000 mg | Freq: Four times a day (QID) | RESPIRATORY_TRACT | Status: DC

## 2014-07-04 MED ORDER — IPRATROPIUM-ALBUTEROL 0.5-2.5 (3) MG/3ML IN SOLN
3.0000 mL | Freq: Four times a day (QID) | RESPIRATORY_TRACT | Status: DC
  Administered 2014-07-05: 3 mL via RESPIRATORY_TRACT
  Filled 2014-07-04: qty 3

## 2014-07-04 MED ORDER — SODIUM CHLORIDE 0.9 % IJ SOLN: 3.0000 mL | INTRAMUSCULAR | Status: DC | PRN

## 2014-07-04 MED ORDER — LISINOPRIL 10 MG PO TABS
20.0000 mg | ORAL_TABLET | Freq: Every day | ORAL | Status: DC
  Administered 2014-07-05: 20 mg via ORAL
  Filled 2014-07-04 (×2): qty 2

## 2014-07-04 MED ORDER — ALBUTEROL SULFATE (2.5 MG/3ML) 0.083% IN NEBU: 2.5000 mg | INHALATION_SOLUTION | Freq: Four times a day (QID) | RESPIRATORY_TRACT | Status: DC

## 2014-07-04 MED ORDER — LEVOFLOXACIN IN D5W 500 MG/100ML IV SOLN
INTRAVENOUS | Status: AC
  Filled 2014-07-04: qty 100

## 2014-07-04 MED ORDER — PNEUMOCOCCAL VAC POLYVALENT 25 MCG/0.5ML IJ INJ
0.5000 mL | INJECTION | Freq: Once | INTRAMUSCULAR | Status: AC
  Administered 2014-07-04: 0.5 mL via INTRAMUSCULAR
  Filled 2014-07-04: qty 0.5

## 2014-07-04 MED ORDER — OXYCODONE-ACETAMINOPHEN 5-325 MG PO TABS
1.0000 | ORAL_TABLET | Freq: Once | ORAL | Status: AC
  Administered 2014-07-04: 1 via ORAL
  Filled 2014-07-04: qty 1

## 2014-07-04 NOTE — ED Notes (Signed)
Able to ambulate in department and maintain O2 sats @ 100%, however audible wheezing present and cough increased with exhertion

## 2014-07-04 NOTE — Progress Notes (Signed)
07/04/14 1400  Mobility  Activity Ambulate in hall  Level of Assistance Independent  Assistive Device None  Distance Ambulated (ft) 300 ft  Ambulation Response Tolerated fair  Patient become short of breath while walking; however, she was able to continue without stopping. Educated patient on taking breaks and increasing activity slowly.

## 2014-07-04 NOTE — H&P (Signed)
PCP:   Default, Provider, MD   Chief Complaint:  Shortness of breath  HPI: 48 year old female who   has a past medical history of HTN (hypertension); GERD (gastroesophageal reflux disease); Constipation; Hypercholesterolemia; Asthma; COPD (chronic obstructive pulmonary disease); Shortness of breath; Headache(784.0); Osteoarthritis; and Diabetes mellitus without complication. Today presented to the hospital with chief complaint of worsening shortness of breath, cough with white-colored phlegm for past one week. Patient has a history of COPD. She denies fever, no nausea vomiting or diarrhea. The breathing gets worse on exertion.  In the ED patient was given continuous neb was a treatments without much improvement. BNP was checked which was 98.3 As outpatient he ran out of her medications over the past one week. She also takes Lasix for lower extremity swelling.  Allergies:  No Known Allergies    Past Medical History  Diagnosis Date  . HTN (hypertension)   . GERD (gastroesophageal reflux disease)   . Constipation   . Hypercholesterolemia   . Asthma   . COPD (chronic obstructive pulmonary disease)   . Shortness of breath   . Headache(784.0)   . Osteoarthritis   . Diabetes mellitus without complication     Past Surgical History  Procedure Laterality Date  . Cesarean section    . Breast reduction surgery    . Tubal ligation    . Esophagogastroduodenoscopy  05/22/2011    Procedure: ESOPHAGOGASTRODUODENOSCOPY (EGD);  Surgeon: Dorothyann Peng, MD;  Location: [facility];  Service: Endoscopy;  Laterality: N/A;  . Pancreatitis      Prior to Admission medications   Medication Sig Start Date End Date Taking? Authorizing Provider  esomeprazole (NEXIUM) 40 MG capsule Take 40 mg by mouth every morning.     Yes Historical Provider, MD  furosemide (LASIX) 40 MG tablet Take 1 tablet (40 mg total) by mouth daily. 05/27/13  Yes Kathie Dike, MD  glipiZIDE (GLUCOTROL) 10 MG tablet Take 1  tablet (10 mg total) by mouth daily before breakfast. 11/06/13  Yes Nat Christen, MD  insulin aspart (NOVOLOG) 100 UNIT/ML injection Inject into the skin 3 (three) times daily before meals.   Yes Historical Provider, MD  insulin detemir (LEVEMIR) 100 UNIT/ML injection Inject 25 Units into the skin at bedtime.   Yes Historical Provider, MD  lisinopril (PRINIVIL,ZESTRIL) 20 MG tablet Take 20 mg by mouth daily.   Yes Historical Provider, MD  meloxicam (MOBIC) 7.5 MG tablet Take 1 tablet (7.5 mg total) by mouth daily. 01/12/14  Yes Tanna Furry, MD  metFORMIN (GLUCOPHAGE) 500 MG tablet Take 1,000 mg by mouth 2 (two) times daily with a meal. 05/27/13  Yes Kathie Dike, MD  potassium chloride (KLOR-CON) 10 MEQ CR tablet Take 10 mEq by mouth 2 (two) times daily.     Yes Historical Provider, MD  rosuvastatin (CRESTOR) 40 MG tablet Take 40 mg by mouth daily.   Yes Historical Provider, MD  cephALEXin (KEFLEX) 500 MG capsule Take 1 capsule (500 mg total) by mouth 4 (four) times daily. 11/04/13   Maudry Diego, MD  ibuprofen (ADVIL,MOTRIN) 800 MG tablet Take 800 mg by mouth 3 (three) times daily.    Historical Provider, MD    Social History:  reports that she has been smoking Cigarettes.  She has a 22 pack-year smoking history. She uses smokeless tobacco. She reports that she does not drink alcohol or use illicit drugs.  Family History  Problem Relation Age of Onset  . Colon cancer Neg Hx   .  Diabetes Mother      All the positives are listed in BOLD  Review of Systems:  HEENT: Headache, blurred vision, runny nose, sore throat Neck: Hypothyroidism, hyperthyroidism,,lymphadenopathy Chest : Shortness of breath, history of COPD, Asthma Heart : Chest pain, history of coronary arterey disease GI:  Nausea, vomiting, diarrhea, constipation, GERD GU: Dysuria, urgency, frequency of urination, hematuria Neuro: Stroke, seizures, syncope Psych: Depression, anxiety, hallucinations   Physical Exam: Blood  pressure 126/73, pulse 74, temperature 98.6 F (37 C), temperature source Oral, resp. rate 19, height 5\' 5"  (1.651 m), weight 131.09 kg (289 lb), last menstrual period 07/02/2014, SpO2 97.00%. Constitutional:   Patient is a well-developed and well-nourished female* in no acute distress and cooperative with exam. Head: Normocephalic and atraumatic Mouth: Mucus membranes moist Eyes: PERRL, EOMI, conjunctivae normal Neck: Supple, No Thyromegaly Cardiovascular: RRR, S1 normal, S2 normal Pulmonary/Chest: Bilateral wheezing Abdominal: Soft. Non-tender, non-distended, bowel sounds are normal, no masses, organomegaly, or guarding present.  Neurological: A&O x3, Strenght is normal and symmetric bilaterally, cranial nerve II-XII are grossly intact, no focal motor deficit, sensory intact to light touch bilaterally.  Extremities : Trace edema of the lower extremities  Labs on Admission:  Basic Metabolic Panel:  Recent Labs Lab 07/04/14 0112  NA 137  K 4.0  CL 101  CO2 25  GLUCOSE 108*  BUN 10  CREATININE 1.07  CALCIUM 9.7   Liver Function Tests: No results found for this basename: AST, ALT, ALKPHOS, BILITOT, PROT, ALBUMIN,  in the last 168 hours No results found for this basename: LIPASE, AMYLASE,  in the last 168 hours No results found for this basename: AMMONIA,  in the last 168 hours CBC:  Recent Labs Lab 07/04/14 0112  WBC 9.0  NEUTROABS 5.0  HGB 10.6*  HCT 32.4*  MCV 81.4  PLT 325   Cardiac Enzymes:  Recent Labs Lab 07/04/14 0112  TROPONINI <0.30    BNP (last 3 results)  Recent Labs  07/04/14 0200  PROBNP 98.3   CBG: No results found for this basename: GLUCAP,  in the last 168 hours  Radiological Exams on Admission: Dg Chest Portable 1 View  07/04/2014   CLINICAL DATA:  Cough, congestion, and shortness of breath for 3 days.  EXAM: PORTABLE CHEST - 1 VIEW  COMPARISON:  05/25/2013  FINDINGS: The heart size and mediastinal contours are within normal limits. Both  lungs are clear. The visualized skeletal structures are unremarkable.  IMPRESSION: No active disease.   Electronically Signed   By: Lucienne Capers M.D.   On: 07/04/2014 00:49    EKG: Independently reviewed. Diffuse T wave inversion, normal sinus rhythm   Assessment/Plan Active Problems:   Acute bronchitis   HTN (hypertension), benign   COPD exacerbation  COPD exacerbation Patient will be admitted, will start Solu-Medrol 60 mg IV every 6 hours, DuoNeb nebulizers every 4 hours. Albuterol nebulizers every 2 hours when necessary. Mucinex 600 mg twice a day. IV Levaquin 500 milligram daily.  Diabetes mellitus Will hold the oral hypoglycemics and start the patient on sliding-scale insulin with NovoLog  Hypertension Continue lisinopril, will also obtain 2-D echocardiogram as the EKG shows diffuse T-wave versions. Troponin is negative in the ED we'll cycle cardiac enzymes.  Hyperlipidemia Continue rosuvastatin  Bilateral lower extremity edema Patient was taking Lasix at home will continue Lasix 40 mg by mouth daily  DVT prophylaxis Lovenox  Code status: Full code  Family discussion: Admission, patients condition and plan of care including tests being ordered have been  discussed with the patient and her husband at bedside* who indicate understanding and agree with the plan and Code Status.   Time Spent on Admission: 60 minutes  [facility] Pager: [phone] 07/04/2014, 5:08 AM  If 7PM-7AM, please contact night-coverage  [email or web address]  Password [email or web address]

## 2014-07-04 NOTE — Plan of Care (Signed)
Problem: ICU Phase Progression Outcomes Goal: O2 sats trending toward baseline Outcome: Completed/Met Date Met:  07/04/14 Patient's O2 sats are 99-100% of room air at rest. Goal: Dyspnea controlled at rest Outcome: Progressing Patient still feels short of breath, despite maintaining O2 sats >93% on RA.

## 2014-07-04 NOTE — Progress Notes (Signed)
  Echocardiogram 2D Echocardiogram has been performed.  Sonya James, [name] 07/04/2014, 10:12 AM

## 2014-07-04 NOTE — ED Provider Notes (Signed)
CSN: [id number]     Arrival date & time 07/03/14  2334 History  This chart was scribed for Sharyon Cable, MD by Molli Posey, ED Scribe. This patient was seen in room APA14/[facility]14 and the patient's care was started 12:17 AM.    Chief Complaint  Patient presents with  . Cough    The history is provided by the patient. No language interpreter was used.   HPI Comments: Sonya James is a 48 y.o. female with a history of COPD and asthma who presents to the Emergency Department complaining of constant cough for the past 10 days. Patient reports associated chest tightness, wheezing and SOB, especially with exertion, as symptoms. She states that OTC medications have failed to provide relief to her symptoms. She reports she tried 2 breathing treatments at home but these also failed to provide relief to her symptoms. She reports a history of smoking. She denies any new leg swelling as a symptom.    Past Medical History  Diagnosis Date  . HTN (hypertension)   . GERD (gastroesophageal reflux disease)   . Constipation   . Hypercholesterolemia   . Asthma   . COPD (chronic obstructive pulmonary disease)   . Shortness of breath   . Headache(784.0)   . Osteoarthritis   . Diabetes mellitus without complication    Past Surgical History  Procedure Laterality Date  . Cesarean section    . Breast reduction surgery    . Tubal ligation    . Esophagogastroduodenoscopy  05/22/2011    Procedure: ESOPHAGOGASTRODUODENOSCOPY (EGD);  Surgeon: Dorothyann Peng, MD;  Location: [facility];  Service: Endoscopy;  Laterality: N/A;  . Pancreatitis     Family History  Problem Relation Age of Onset  . Colon cancer Neg Hx   . Diabetes Mother    History  Substance Use Topics  . Smoking status: Current Every Day Smoker -- 1.00 packs/day for 22 years    Types: Cigarettes  . Smokeless tobacco: Current User  . Alcohol Use: No   OB History   Grav Para Term Preterm Abortions TAB SAB Ect Mult Living           Review of Systems  Respiratory: Positive for cough, chest tightness, shortness of breath and wheezing.   Cardiovascular: Negative for leg swelling.  All other systems reviewed and are negative.   Allergies  Review of patient's allergies indicates no known allergies.  Home Medications   Prior to Admission medications   Medication Sig Start Date End Date Taking? Authorizing Provider  esomeprazole (NEXIUM) 40 MG capsule Take 40 mg by mouth every morning.     Yes Historical Provider, MD  furosemide (LASIX) 40 MG tablet Take 1 tablet (40 mg total) by mouth daily. 05/27/13  Yes Kathie Dike, MD  glipiZIDE (GLUCOTROL) 10 MG tablet Take 1 tablet (10 mg total) by mouth daily before breakfast. 11/06/13  Yes Nat Christen, MD  insulin aspart (NOVOLOG) 100 UNIT/ML injection Inject into the skin 3 (three) times daily before meals.   Yes Historical Provider, MD  insulin detemir (LEVEMIR) 100 UNIT/ML injection Inject 25 Units into the skin at bedtime.   Yes Historical Provider, MD  lisinopril (PRINIVIL,ZESTRIL) 20 MG tablet Take 20 mg by mouth daily.   Yes Historical Provider, MD  meloxicam (MOBIC) 7.5 MG tablet Take 1 tablet (7.5 mg total) by mouth daily. 01/12/14  Yes Tanna Furry, MD  metFORMIN (GLUCOPHAGE) 500 MG tablet Take 1,000 mg by mouth 2 (two) times daily with a  meal. 05/27/13  Yes Kathie Dike, MD  potassium chloride (KLOR-CON) 10 MEQ CR tablet Take 10 mEq by mouth 2 (two) times daily.     Yes Historical Provider, MD  rosuvastatin (CRESTOR) 40 MG tablet Take 40 mg by mouth daily.   Yes Historical Provider, MD  cephALEXin (KEFLEX) 500 MG capsule Take 1 capsule (500 mg total) by mouth 4 (four) times daily. 11/04/13   Maudry Diego, MD  ibuprofen (ADVIL,MOTRIN) 800 MG tablet Take 800 mg by mouth 3 (three) times daily.    Historical Provider, MD   BP 133/115  Pulse 88  Temp(Src) 98.6 F (37 C) (Oral)  Resp 20  Ht 5\' 5"  (1.651 m)  Wt 289 lb (131.09 kg)  BMI 48.09 kg/m2  SpO2 100%   LMP 07/02/2014  Physical Exam  Nursing note and vitals reviewed.  CONSTITUTIONAL: Well developed/well nourished HEAD: Normocephalic/atraumatic EYES: EOMI/PERRL ENMT: Mucous membranes moist NECK: supple no meningeal signs SPINE:entire spine nontender CV: S1/S2 noted, no murmurs/rubs/gallops noted LUNGS: Wheezing bilaterally, pt coughs frequently during exam ABDOMEN: soft, nontender, no rebound or guarding GU:no cva tenderness NEURO: Pt is awake/alert, moves all extremitiesx4 EXTREMITIES: pulses normal, full ROM SKIN: warm, color normal PSYCH: no abnormalities of mood noted  ED Course  Procedures  DIAGNOSTIC STUDIES: Oxygen Saturation is 100% on RA, normal by my interpretation.    COORDINATION OF CARE: 12:21 AM Discussed treatment plan with pt at bedside and pt agreed to plan.  3:32 AM Despite nebulizer/steroids, pt still with wheezing After ambulation, patient tachypneic with wheezing Will admit for further treatment Also noted to have abnormal EKG, though current symptoms more c/w COPD exacerbation rather than ACS D/w dr Darrick Meigs, will admit   Labs Review Labs Reviewed  CBC WITH DIFFERENTIAL - Abnormal; Notable for the following:    Hemoglobin 10.6 (*)    HCT 32.4 (*)    RDW 16.0 (*)    All other components within normal limits  BASIC METABOLIC PANEL - Abnormal; Notable for the following:    Glucose, Bld 108 (*)    GFR calc non Af Amer 60 (*)    GFR calc Af Amer 70 (*)    All other components within normal limits  TROPONIN I  PRO B NATRIURETIC PEPTIDE    Imaging Review Dg Chest Portable 1 View  07/04/2014   CLINICAL DATA:  Cough, congestion, and shortness of breath for 3 days.  EXAM: PORTABLE CHEST - 1 VIEW  COMPARISON:  05/25/2013  FINDINGS: The heart size and mediastinal contours are within normal limits. Both lungs are clear. The visualized skeletal structures are unremarkable.  IMPRESSION: No active disease.   Electronically Signed   By: Lucienne Capers M.D.    On: 07/04/2014 00:49     EKG Interpretation   Date/Time:  Saturday July 04 2014 00:41:27 EDT Ventricular Rate:  72 PR Interval:  140 QRS Duration: 104 QT Interval:  425 QTC Calculation: 465 R Axis:   68 Text Interpretation:  Sinus rhythm Repol abnrm, global ischemia, diffuse  leads changed from prior Baseline wander in lead(s) V5 Confirmed by  Christy Gentles  MD, Elenore Rota ([id number]) on 07/04/2014 12:49:14 AM      MDM   Final diagnoses:  Cough  Chronic obstructive pulmonary disease with acute exacerbation  Abnormal EKG    Nursing notes including past medical history and social history reviewed and considered in documentation xrays reviewed and considered Labs/vital reviewed and considered   I personally performed the services described in this documentation, which was  scribed in my presence. The recorded information has been reviewed and is accurate.      Sharyon Cable, MD 07/04/14 [phone]

## 2014-07-04 NOTE — Progress Notes (Signed)
Patient admitted to the hospital earlier this morning by Dr. Darrick Meigs.  Patient seen and examined.  She's been admitted with a COPD exacerbation. She is on steroids, bronchodilators and antibiotics. Continue supportive treatment for now. Anticipate discharge in the next 24 hours.  Sonya James

## 2014-07-05 DIAGNOSIS — K219 Gastro-esophageal reflux disease without esophagitis: Secondary | ICD-10-CM

## 2014-07-05 LAB — CBC
HCT: 32.9 % — ABNORMAL LOW (ref 36.0–46.0)
HEMOGLOBIN: 10.5 g/dL — AB (ref 12.0–15.0)
MCH: 26.3 pg (ref 26.0–34.0)
MCHC: 31.9 g/dL (ref 30.0–36.0)
MCV: 82.3 fL (ref 78.0–100.0)
Platelets: 373 10*3/uL (ref 150–400)
RBC: 4 MIL/uL (ref 3.87–5.11)
RDW: 16 % — ABNORMAL HIGH (ref 11.5–15.5)
WBC: 19.6 10*3/uL — ABNORMAL HIGH (ref 4.0–10.5)

## 2014-07-05 LAB — COMPREHENSIVE METABOLIC PANEL
ALT: 14 U/L (ref 0–35)
AST: 14 U/L (ref 0–37)
Albumin: 3.2 g/dL — ABNORMAL LOW (ref 3.5–5.2)
Alkaline Phosphatase: 95 U/L (ref 39–117)
Anion gap: 12 (ref 5–15)
BUN: 11 mg/dL (ref 6–23)
CO2: 22 mEq/L (ref 19–32)
Calcium: 10.1 mg/dL (ref 8.4–10.5)
Chloride: 104 mEq/L (ref 96–112)
Creatinine, Ser: 1.05 mg/dL (ref 0.50–1.10)
GFR calc Af Amer: 72 mL/min — ABNORMAL LOW (ref >90)
GFR calc non Af Amer: 62 mL/min — ABNORMAL LOW (ref >90)
Glucose, Bld: 416 mg/dL — ABNORMAL HIGH (ref 70–99)
Potassium: 4.5 mEq/L (ref 3.7–5.3)
SODIUM: 138 meq/L (ref 137–147)
TOTAL PROTEIN: 7.2 g/dL (ref 6.0–8.3)
Total Bilirubin: 0.2 mg/dL — ABNORMAL LOW (ref 0.3–1.2)

## 2014-07-05 LAB — GLUCOSE, CAPILLARY
GLUCOSE-CAPILLARY: 335 mg/dL — AB (ref 70–99)
Glucose-Capillary: 355 mg/dL — ABNORMAL HIGH (ref 70–99)

## 2014-07-05 MED ORDER — METFORMIN HCL 1000 MG PO TABS: 1000.0000 mg | ORAL_TABLET | Freq: Two times a day (BID) | ORAL | Status: DC

## 2014-07-05 MED ORDER — POTASSIUM CHLORIDE 10 MEQ PO TBCR: 10.0000 meq | EXTENDED_RELEASE_TABLET | Freq: Two times a day (BID) | ORAL | Status: DC

## 2014-07-05 MED ORDER — ESOMEPRAZOLE MAGNESIUM 40 MG PO CPDR: 40.0000 mg | DELAYED_RELEASE_CAPSULE | ORAL | Status: DC

## 2014-07-05 MED ORDER — LISINOPRIL 20 MG PO TABS: 20.0000 mg | ORAL_TABLET | Freq: Every day | ORAL | Status: DC

## 2014-07-05 MED ORDER — ALBUTEROL SULFATE (2.5 MG/3ML) 0.083% IN NEBU: 2.5000 mg | INHALATION_SOLUTION | Freq: Four times a day (QID) | RESPIRATORY_TRACT | Status: DC | PRN

## 2014-07-05 MED ORDER — INSULIN DETEMIR 100 UNIT/ML ~~LOC~~ SOLN: 35.0000 [IU] | Freq: Every day | SUBCUTANEOUS | Status: DC

## 2014-07-05 MED ORDER — INSULIN DETEMIR 100 UNIT/ML ~~LOC~~ SOLN
30.0000 [IU] | Freq: Every day | SUBCUTANEOUS | Status: DC
  Filled 2014-07-05: qty 0.3

## 2014-07-05 MED ORDER — ROSUVASTATIN CALCIUM 40 MG PO TABS: 40.0000 mg | ORAL_TABLET | Freq: Every day | ORAL | Status: DC

## 2014-07-05 MED ORDER — GUAIFENESIN ER 600 MG PO TB12: 600.0000 mg | ORAL_TABLET | Freq: Two times a day (BID) | ORAL | Status: DC

## 2014-07-05 MED ORDER — INSULIN ASPART 100 UNIT/ML ~~LOC~~ SOLN: 2.0000 [IU] | Freq: Three times a day (TID) | SUBCUTANEOUS | Status: DC

## 2014-07-05 MED ORDER — PREDNISONE 10 MG PO TABS: ORAL_TABLET | ORAL | Status: DC

## 2014-07-05 MED ORDER — INSULIN DETEMIR 100 UNIT/ML ~~LOC~~ SOLN
10.0000 [IU] | SUBCUTANEOUS | Status: AC
  Administered 2014-07-05: 10 [IU] via SUBCUTANEOUS
  Filled 2014-07-05: qty 0.1

## 2014-07-05 MED ORDER — LEVOFLOXACIN 500 MG PO TABS: 500.0000 mg | ORAL_TABLET | Freq: Every day | ORAL | Status: DC

## 2014-07-05 MED ORDER — GLIPIZIDE 10 MG PO TABS: 10.0000 mg | ORAL_TABLET | Freq: Every day | ORAL | Status: DC

## 2014-07-05 NOTE — Discharge Instructions (Signed)

## 2014-07-05 NOTE — Progress Notes (Signed)
NURSING PROGRESS NOTE  OLIVE ZMUDA [id number] Discharge Data: 07/05/2014 3:56 PM Attending Provider: No att. providers found [id number], Provider, MD   Lenore Cordia to be D/C'd Home per MD order.    All IV's discontinued and monitored for bleeding.  All belongings returned to patient for patient to take home.  AVS summary reviewed with patient. Prescriptions given to patient. Vouchers for medications given to patient.  Patient left floor ambulatory, escorted by RN and NT.  Last Documented Vital Signs:  Blood pressure 162/85, pulse 101, temperature 98.1 F (36.7 C), temperature source Oral, resp. rate 18, height 5\' 5"  (1.651 m), weight 131.5 kg (289 lb 14.5 oz), last menstrual period 07/02/2014, SpO2 93.00%.  Cecilie Kicks D

## 2014-07-05 NOTE — Discharge Summary (Signed)
Physician Discharge Summary  Sonya James:[id number] DOB: 01/18/1966 DOA: 07/03/2014  PCP: Default, Provider, MD  Admit date: 07/03/2014 Discharge date: 07/05/2014  Time spent: 40 minutes  Recommendations for Outpatient Follow-up:  1. Follow up with primary care physician in 1-2 weeks  Discharge Diagnoses:  Active Problems:   Acute bronchitis   HTN (hypertension), benign   COPD exacerbation Type 2 Diabetes, uncontrolled Hyperlipidemia GERD   Discharge Condition: improved  Diet recommendation: low salt, low carb  Filed Weights   07/03/14 2342 07/04/14 0531  Weight: 131.09 kg (289 lb) 131.5 kg (289 lb 14.5 oz)    History of present illness:  This patient presents to the hospital progressive shortness of breath. She has a known history of COPD. She was having shortness of breath, cough and what color sputum for approximately 1 week. She was found to have a COPD exacerbation and was admitted for further treatments.  Hospital Course:  Patient was started on intravenous steroids, bronchodilators and antibiotics. She appears to have improved to baseline. She does not require any oxygen. Her wheezing has resolved. She'll be transitioned to a prednisone taper. We'll continue her on albuterol nebulizer. She has received a MATCH voucher to help obtain her medications. Insulin was adjusted for hyperglycemia related to steroid use. She'll follow with her primary care physician a 1-2 weeks.  Procedures:  Consultations:  Discharge Exam: Filed Vitals:   07/05/14 1441  BP: 162/85  Pulse: 101  Temp:   Resp: 18    General: NAD Cardiovascular: s1, s2, rrr Respiratory: cta b  Discharge Instructions You were cared for by a hospitalist during your hospital stay. If you have any questions about your discharge medications or the care you received while you were in the hospital after you are discharged, you can call the unit and asked to speak with the hospitalist on call if the  hospitalist that took care of you is not available. Once you are discharged, your primary care physician will handle any further medical issues. Please note that NO REFILLS for any discharge medications will be authorized once you are discharged, as it is imperative that you return to your primary care physician (or establish a relationship with a primary care physician if you do not have one) for your aftercare needs so that they can reassess your need for medications and monitor your lab values.  Discharge Instructions   Call MD for:  difficulty breathing, headache or visual disturbances    Complete by:  As directed      Call MD for:  temperature >100.4    Complete by:  As directed      Diet - low sodium heart healthy    Complete by:  As directed      Increase activity slowly    Complete by:  As directed           Current Discharge Medication List    START taking these medications   Details  albuterol (PROVENTIL) (2.5 MG/3ML) 0.083% nebulizer solution Take 3 mLs (2.5 mg total) by nebulization every 6 (six) hours as needed for wheezing. Qty: 75 mL, Refills: 12    guaiFENesin (MUCINEX) 600 MG 12 hr tablet Take 1 tablet (600 mg total) by mouth 2 (two) times daily. Qty: 14 tablet, Refills: 0    levofloxacin (LEVAQUIN) 500 MG tablet Take 1 tablet (500 mg total) by mouth daily. Qty: 2 tablet, Refills: 0    predniSONE (DELTASONE) 10 MG tablet Take 40mg  po daily for 2 days  then 30mg  po daily for 2 days then 20mg  po daily for 2 days then 10mg  po daily for 2 days then stop Qty: 20 tablet, Refills: 0      CONTINUE these medications which have CHANGED   Details  esomeprazole (NEXIUM) 40 MG capsule Take 1 capsule (40 mg total) by mouth every morning. Qty: 30 capsule, Refills: 0    glipiZIDE (GLUCOTROL) 10 MG tablet Take 1 tablet (10 mg total) by mouth daily before breakfast. Qty: 30 tablet, Refills: 0    insulin aspart (NOVOLOG) 100 UNIT/ML injection Inject 2-6 Units into the skin 3  (three) times daily before meals. Per sliding scale Qty: 10 mL, Refills: 11    insulin detemir (LEVEMIR) 100 UNIT/ML injection Inject 0.35 mLs (35 Units total) into the skin at bedtime. Qty: 10 mL, Refills: 11    lisinopril (PRINIVIL,ZESTRIL) 20 MG tablet Take 1 tablet (20 mg total) by mouth daily. Qty: 30 tablet, Refills: 0    metFORMIN (GLUCOPHAGE) 1000 MG tablet Take 1 tablet (1,000 mg total) by mouth 2 (two) times daily with a meal. Qty: 60 tablet, Refills: 0    potassium chloride (KLOR-CON) 10 MEQ CR tablet Take 1 tablet (10 mEq total) by mouth 2 (two) times daily. Qty: 60 tablet, Refills: 0    rosuvastatin (CRESTOR) 40 MG tablet Take 1 tablet (40 mg total) by mouth daily. Qty: 30 tablet, Refills: 0      CONTINUE these medications which have NOT CHANGED   Details  furosemide (LASIX) 40 MG tablet Take 1 tablet (40 mg total) by mouth daily. Qty: 30 tablet, Refills: 0    meloxicam (MOBIC) 7.5 MG tablet Take 1 tablet (7.5 mg total) by mouth daily. Qty: 28 tablet, Refills: 0      STOP taking these medications     cephALEXin (KEFLEX) 500 MG capsule      ibuprofen (ADVIL,MOTRIN) 800 MG tablet        No Known Allergies Follow-up Information   Follow up with follow up with primary care physician in 2 weeks.       The results of significant diagnostics from this hospitalization (including imaging, microbiology, ancillary and laboratory) are listed below for reference.    Significant Diagnostic Studies: Dg Chest Portable 1 View  07/04/2014   CLINICAL DATA:  Cough, congestion, and shortness of breath for 3 days.  EXAM: PORTABLE CHEST - 1 VIEW  COMPARISON:  05/25/2013  FINDINGS: The heart size and mediastinal contours are within normal limits. Both lungs are clear. The visualized skeletal structures are unremarkable.  IMPRESSION: No active disease.   Electronically Signed   By: Lucienne Capers M.D.   On: 07/04/2014 00:49    Microbiology: No results found for this or any  previous visit (from the past 240 hour(s)).   Labs: Basic Metabolic Panel:  Recent Labs Lab 07/04/14 0112 07/05/14 0549  NA 137 138  K 4.0 4.5  CL 101 104  CO2 25 22  GLUCOSE 108* 416*  BUN 10 11  CREATININE 1.07 1.05  CALCIUM 9.7 10.1   Liver Function Tests:  Recent Labs Lab 07/05/14 0549  AST 14  ALT 14  ALKPHOS 95  BILITOT 0.2*  PROT 7.2  ALBUMIN 3.2*   No results found for this basename: LIPASE, AMYLASE,  in the last 168 hours No results found for this basename: AMMONIA,  in the last 168 hours CBC:  Recent Labs Lab 07/04/14 0112 07/05/14 0549  WBC 9.0 19.6*  NEUTROABS 5.0  --  HGB 10.6* 10.5*  HCT 32.4* 32.9*  MCV 81.4 82.3  PLT 325 373   Cardiac Enzymes:  Recent Labs Lab 07/04/14 0112 07/04/14 0529 07/04/14 1130 07/04/14 1731  TROPONINI <0.30 <0.30 <0.30 <0.30   BNP: BNP (last 3 results)  Recent Labs  07/04/14 0200  PROBNP 98.3   CBG:  Recent Labs Lab 07/04/14 1140 07/04/14 1637 07/04/14 2037 07/05/14 0735 07/05/14 1135  GLUCAP 230* 348* 270* 355* 335*       Signed:  Hans Rusher  [facility] 07/05/2014, 3:15 PM

## 2014-07-05 NOTE — Progress Notes (Signed)
Utilization review Completed Yancarlos Berthold RN BSN   

## 2014-07-05 NOTE — Progress Notes (Signed)
ED/CM noted patient did not have health insurance and/or PCP listed in the computer.  Patient was given the [facility] resources handout with information on the clinics, food pantries, and the handout for new health insurance sign-up.  Provided a discount drug card to use.  Patient was approved for the [place] for medications.   Also, provided a phone number to CM during the weekday need to communicate regarding financial assistance for bill.  Patient stated she was approved but still receiving medical bills.  Concerns of financial problems patient not able to be medical demands for new diagnosis of diabetes.  Patient is currently signed up with the [facility] also aware of the [facility] Department.   Patient expressed appreciation for information received.

## 2014-12-23 ENCOUNTER — Emergency Department (HOSPITAL_COMMUNITY)
Admission: EM | Admit: 2014-12-23 | Discharge: 2014-12-23 | Disposition: A | Discharge status: Home / Self Care | Payer: Self-pay | Attending: Emergency Medicine | Admitting: Emergency Medicine

## 2014-12-23 ENCOUNTER — Encounter (HOSPITAL_COMMUNITY): Payer: Self-pay | Admitting: *Deleted

## 2014-12-23 DIAGNOSIS — R739 Hyperglycemia, unspecified: Secondary | ICD-10-CM

## 2014-12-23 DIAGNOSIS — N939 Abnormal uterine and vaginal bleeding, unspecified: Secondary | ICD-10-CM | POA: Insufficient documentation

## 2014-12-23 DIAGNOSIS — J449 Chronic obstructive pulmonary disease, unspecified: Secondary | ICD-10-CM | POA: Insufficient documentation

## 2014-12-23 DIAGNOSIS — K219 Gastro-esophageal reflux disease without esophagitis: Secondary | ICD-10-CM | POA: Insufficient documentation

## 2014-12-23 DIAGNOSIS — Z79899 Other long term (current) drug therapy: Secondary | ICD-10-CM | POA: Insufficient documentation

## 2014-12-23 DIAGNOSIS — Z8719 Personal history of other diseases of the digestive system: Secondary | ICD-10-CM | POA: Insufficient documentation

## 2014-12-23 DIAGNOSIS — Z794 Long term (current) use of insulin: Secondary | ICD-10-CM | POA: Insufficient documentation

## 2014-12-23 DIAGNOSIS — E1165 Type 2 diabetes mellitus with hyperglycemia: Secondary | ICD-10-CM | POA: Insufficient documentation

## 2014-12-23 DIAGNOSIS — I10 Essential (primary) hypertension: Secondary | ICD-10-CM | POA: Insufficient documentation

## 2014-12-23 DIAGNOSIS — E780 Pure hypercholesterolemia: Secondary | ICD-10-CM | POA: Insufficient documentation

## 2014-12-23 DIAGNOSIS — M1990 Unspecified osteoarthritis, unspecified site: Secondary | ICD-10-CM | POA: Insufficient documentation

## 2014-12-23 DIAGNOSIS — Z720 Tobacco use: Secondary | ICD-10-CM | POA: Insufficient documentation

## 2014-12-23 LAB — COMPREHENSIVE METABOLIC PANEL
ALBUMIN: 3.8 g/dL (ref 3.5–5.2)
ALT: 17 U/L (ref 0–35)
AST: 16 U/L (ref 0–37)
Alkaline Phosphatase: 117 U/L (ref 39–117)
Anion gap: 7 (ref 5–15)
BUN: 12 mg/dL (ref 6–23)
CALCIUM: 9.9 mg/dL (ref 8.4–10.5)
CO2: 26 mmol/L (ref 19–32)
Chloride: 98 mmol/L (ref 96–112)
Creatinine, Ser: 1.37 mg/dL — ABNORMAL HIGH (ref 0.50–1.10)
GFR calc Af Amer: 52 mL/min — ABNORMAL LOW (ref >90)
GFR, EST NON AFRICAN AMERICAN: 44 mL/min — AB (ref >90)
Glucose, Bld: 484 mg/dL — ABNORMAL HIGH (ref 70–99)
Potassium: 4.2 mmol/L (ref 3.5–5.1)
SODIUM: 131 mmol/L — AB (ref 135–145)
Total Bilirubin: 0.5 mg/dL (ref 0.3–1.2)
Total Protein: 7.9 g/dL (ref 6.0–8.3)

## 2014-12-23 LAB — CBC WITH DIFFERENTIAL/PLATELET
BASOS ABS: 0 10*3/uL (ref 0.0–0.1)
Basophils Relative: 0 % (ref 0–1)
Eosinophils Absolute: 0.3 10*3/uL (ref 0.0–0.7)
Eosinophils Relative: 3 % (ref 0–5)
HEMATOCRIT: 35.2 % — AB (ref 36.0–46.0)
Hemoglobin: 11 g/dL — ABNORMAL LOW (ref 12.0–15.0)
LYMPHS PCT: 29 % (ref 12–46)
Lymphs Abs: 2.3 10*3/uL (ref 0.7–4.0)
MCH: 23.6 pg — ABNORMAL LOW (ref 26.0–34.0)
MCHC: 31.3 g/dL (ref 30.0–36.0)
MCV: 75.4 fL — AB (ref 78.0–100.0)
Monocytes Absolute: 0.6 10*3/uL (ref 0.1–1.0)
Monocytes Relative: 7 % (ref 3–12)
NEUTROS PCT: 61 % (ref 43–77)
Neutro Abs: 4.8 10*3/uL (ref 1.7–7.7)
PLATELETS: 366 10*3/uL (ref 150–400)
RBC: 4.67 MIL/uL (ref 3.87–5.11)
RDW: 16.1 % — AB (ref 11.5–15.5)
WBC: 8 10*3/uL (ref 4.0–10.5)

## 2014-12-23 LAB — URINALYSIS, ROUTINE W REFLEX MICROSCOPIC
Bilirubin Urine: NEGATIVE
Glucose, UA: >1000 mg/dL — AB
Ketones, ur: NEGATIVE mg/dL
Leukocytes, UA: NEGATIVE
Nitrite: NEGATIVE
Protein, ur: 30 mg/dL — AB
Specific Gravity, Urine: 1.015 (ref 1.005–1.030)
Urobilinogen, UA: 0.2 mg/dL (ref 0.0–1.0)
pH: 5.5 (ref 5.0–8.0)

## 2014-12-23 LAB — URINE MICROSCOPIC-ADD ON

## 2014-12-23 LAB — WET PREP, GENITAL
Clue Cells Wet Prep HPF POC: NONE SEEN
Trich, Wet Prep: NONE SEEN

## 2014-12-23 LAB — CBG MONITORING, ED: GLUCOSE-CAPILLARY: 301 mg/dL — AB (ref 70–99)

## 2014-12-23 MED ORDER — SODIUM CHLORIDE 0.9 % IV BOLUS (SEPSIS)
2000.0000 mL | Freq: Once | INTRAVENOUS | Status: AC
  Administered 2014-12-23: 2000 mL via INTRAVENOUS

## 2014-12-23 MED ORDER — INSULIN ASPART 100 UNIT/ML ~~LOC~~ SOLN
10.0000 [IU] | Freq: Once | SUBCUTANEOUS | Status: AC
  Administered 2014-12-23: 10 [IU] via INTRAVENOUS
  Filled 2014-12-23: qty 1

## 2014-12-23 MED ORDER — MEDROXYPROGESTERONE ACETATE 5 MG PO TABS: 5.0000 mg | ORAL_TABLET | Freq: Every day | ORAL | Status: DC

## 2014-12-23 MED ORDER — INSULIN NPH ISOPHANE & REGULAR (70-30) 100 UNIT/ML ~~LOC~~ SUSP: 13.0000 [IU] | Freq: Two times a day (BID) | SUBCUTANEOUS | Status: DC

## 2014-12-23 NOTE — ED Notes (Signed)
Vaginal bleeding for 3 mos. Discomfort pelvic region.

## 2014-12-23 NOTE — ED Provider Notes (Addendum)
17:05- patient reevaluated at the request of Dr. call us to follow-up on labs. CBG improved treatment. Wet prep is normal except moderate white blood cells and a few yeast. Urinalysis has few bacteria, clean catch, with red blood cells but no white blood cells. This is nondiagnostic for urinary tract infection. Remainder labs are reviewed and are reassuring.  At this time, the patient, states that she feels better. She has had some urinary frequency which she attributes to her diuretic medication. She has had some burning with urination but no hematuria. She denies fever, chills, nausea or vomiting. I will add a urine culture, looking for occult urinary tract infection.  I discussed the findings with the patient and possible diagnostic etiologies. She is stable for discharge at this time. Dr. Wilson Singer as previously written prescriptions for Provera, and insulin. She has a primary care doctor to follow-up with, next week.    Daleen Bo, MD 12/23/14 [phone]

## 2014-12-23 NOTE — ED Notes (Signed)
PT c/o vaginal bleeding for 3 months. Pt denies any urinary symptoms.

## 2014-12-23 NOTE — Discharge Instructions (Signed)
Abnormal Uterine Bleeding Abnormal uterine bleeding can affect women at various stages in life, including teenagers, women in their reproductive years, pregnant women, and women who have reached menopause. Several kinds of uterine bleeding are considered abnormal, including:  Bleeding or spotting between periods.   Bleeding after sexual intercourse.   Bleeding that is heavier or more than normal.   Periods that last longer than usual.  Bleeding after menopause.  Many cases of abnormal uterine bleeding are minor and simple to treat, while others are more serious. Any type of abnormal bleeding should be evaluated by your health care provider. Treatment will depend on the cause of the bleeding. HOME CARE INSTRUCTIONS Monitor your condition for any changes. The following actions may help to alleviate any discomfort you are experiencing:  Avoid the use of tampons and douches as directed by your health care provider.  Change your pads frequently. You should get regular pelvic exams and Pap tests. Keep all follow-up appointments for diagnostic tests as directed by your health care provider.  SEEK MEDICAL CARE IF:   Your bleeding lasts more than 1 week.   You feel dizzy at times.  SEEK IMMEDIATE MEDICAL CARE IF:   You pass out.   You are changing pads every 15 to 30 minutes.   You have abdominal pain.  You have a fever.   You become sweaty or weak.   You are passing large blood clots from the vagina.   You start to feel nauseous and vomit. MAKE SURE YOU:   Understand these instructions.  Will watch your condition.  Will get help right away if you are not doing well or get worse. Document Released: 08/28/2005 Document Revised: 09/02/2013 Document Reviewed: 03/27/2013 Exit[employer] Patient Information 2015 [employer]. This information is not intended to replace advice given to you by your health care provider. Make sure you discuss any questions you have with your  health care provider.  

## 2014-12-23 NOTE — Clinical Social Work Note (Signed)
CSW received consult for medication assistance. CM notified and met with pt. Will sign off.   Benay Pike, [place]

## 2014-12-23 NOTE — ED Provider Notes (Signed)
CSN: [id number]     Arrival date & time 12/23/14  1206 History   First MD Initiated Contact with Patient 12/23/14 1345     Chief Complaint  Patient presents with  . Vaginal Bleeding     (Consider location/radiation/quality/duration/timing/severity/associated sxs/prior Treatment) HPI   49 year old female with vaginal bleeding. Intermittent dark red blood noted with occasional small clots over the past three or so months. Patient states less than a "regular." But enough to wear a pad at all times. No pain. No dizziness, lightheadedness or shortness of breath. Status post tubal ligation. No blood thinners. Patient also noted to be hyperglycemic. When discussing with her further she states that she has been stretching out her insulin because she cannot afford the high prices.  Past Medical History  Diagnosis Date  . HTN (hypertension)   . GERD (gastroesophageal reflux disease)   . Constipation   . Hypercholesterolemia   . Asthma   . COPD (chronic obstructive pulmonary disease)   . Shortness of breath   . Headache(784.0)   . Osteoarthritis   . Diabetes mellitus without complication    Past Surgical History  Procedure Laterality Date  . Cesarean section    . Breast reduction surgery    . Tubal ligation    . Esophagogastroduodenoscopy  05/22/2011    Procedure: ESOPHAGOGASTRODUODENOSCOPY (EGD);  Surgeon: Dorothyann Peng, MD;  [facility]: [facility];  Service: Endoscopy;  Laterality: N/A;  . Pancreatitis     Family History  Problem Relation Age of Onset  . Colon cancer Neg Hx   . Diabetes Mother    History  Substance Use Topics  . Smoking status: Current Every Day Smoker -- 1.00 packs/day for 22 years    Types: Cigarettes  . Smokeless tobacco: Current User  . Alcohol Use: No   OB History    No data available     Review of Systems  All systems reviewed and negative, other than as noted in HPI.   Allergies  Review of patient's allergies indicates no known  allergies.  Home Medications   Prior to Admission medications   Medication Sig Start Date End Date Taking? Authorizing Provider  albuterol (PROVENTIL) (2.5 MG/3ML) 0.083% nebulizer solution Take 3 mLs (2.5 mg total) by nebulization every 6 (six) hours as needed for wheezing. 07/05/14  Yes Kathie Dike, MD  esomeprazole (NEXIUM) 40 MG capsule Take 1 capsule (40 mg total) by mouth every morning. 07/05/14  Yes Kathie Dike, MD  furosemide (LASIX) 40 MG tablet Take 1 tablet (40 mg total) by mouth daily. 05/27/13  Yes Kathie Dike, MD  gabapentin (NEURONTIN) 300 MG capsule Take 300 mg by mouth 3 (three) times daily.   Yes Historical Provider, MD  guaiFENesin (MUCINEX) 600 MG 12 hr tablet Take 1 tablet (600 mg total) by mouth 2 (two) times daily. 07/05/14  Yes Kathie Dike, MD  insulin aspart (NOVOLOG) 100 UNIT/ML injection Inject 2-6 Units into the skin 3 (three) times daily before meals. Per sliding scale 07/05/14  Yes Kathie Dike, MD  insulin detemir (LEVEMIR) 100 UNIT/ML injection Inject 0.35 mLs (35 Units total) into the skin at bedtime. 07/05/14  Yes Kathie Dike, MD  lisinopril (PRINIVIL,ZESTRIL) 20 MG tablet Take 1 tablet (20 mg total) by mouth daily. 07/05/14  Yes Kathie Dike, MD  meloxicam (MOBIC) 7.5 MG tablet Take 1 tablet (7.5 mg total) by mouth daily. 01/12/14  Yes Tanna Furry, MD  metFORMIN (GLUCOPHAGE) 1000 MG tablet Take 1 tablet (1,000 mg total) by mouth 2 (  two) times daily with a meal. 07/05/14  Yes Kathie Dike, MD  potassium chloride (KLOR-CON) 10 MEQ CR tablet Take 1 tablet (10 mEq total) by mouth 2 (two) times daily. 07/05/14  Yes Kathie Dike, MD  rosuvastatin (CRESTOR) 40 MG tablet Take 1 tablet (40 mg total) by mouth daily. 07/05/14  Yes Kathie Dike, MD  glipiZIDE (GLUCOTROL) 10 MG tablet Take 1 tablet (10 mg total) by mouth daily before breakfast. Patient not taking: Reported on 12/23/2014 07/05/14   Kathie Dike, MD  levofloxacin (LEVAQUIN) 500 MG tablet  Take 1 tablet (500 mg total) by mouth daily. Patient not taking: Reported on 12/23/2014 07/05/14   Kathie Dike, MD  predniSONE (DELTASONE) 10 MG tablet Take 40mg  po daily for 2 days then 30mg  po daily for 2 days then 20mg  po daily for 2 days then 10mg  po daily for 2 days then stop Patient not taking: Reported on 12/23/2014 07/05/14   Kathie Dike, MD   BP 134/73 mmHg  Pulse 80  Temp(Src) 98.7 F (37.1 C) (Oral)  Resp 18  Ht 5\' 6"  (1.676 m)  Wt 273 lb 7 oz (124.03 kg)  BMI 44.15 kg/m2  SpO2 100%  LMP  (LMP Unknown) Physical Exam  Constitutional: She appears well-developed and well-nourished. No distress.  HENT:  Head: Normocephalic and atraumatic.  Eyes: Conjunctivae are normal. Right eye exhibits no discharge. Left eye exhibits no discharge.  Neck: Neck supple.  Cardiovascular: Normal rate, regular rhythm and normal heart sounds.  Exam reveals no gallop and no friction rub.   No murmur heard. Pulmonary/Chest: Effort normal and breath sounds normal. No respiratory distress.  Abdominal: Soft. She exhibits no distension. There is no tenderness.  Genitourinary:  Chaperone present. Normal external female genitalia. Small amount of dark blood noted in vault. No concerning lesions no appreciable adnexal mass or significant adnexal tenderness..   Musculoskeletal: She exhibits no edema or tenderness.  Neurological: She is alert.  Skin: Skin is warm and dry.  Psychiatric: She has a normal mood and affect. Her behavior is normal. Thought content normal.  Nursing note and vitals reviewed.   ED Course  Procedures (including critical care time) Labs Review Labs Reviewed  CBC WITH DIFFERENTIAL/PLATELET - Abnormal; Notable for the following:    Hemoglobin 11.0 (*)    HCT 35.2 (*)    MCV 75.4 (*)    MCH 23.6 (*)    RDW 16.1 (*)    All other components within normal limits  COMPREHENSIVE METABOLIC PANEL - Abnormal; Notable for the following:    Sodium 131 (*)    Glucose, Bld 484 (*)     Creatinine, Ser 1.37 (*)    GFR calc non Af Amer 44 (*)    GFR calc Af Amer 52 (*)    All other components within normal limits  WET PREP, GENITAL  RPR  URINALYSIS, ROUTINE W REFLEX MICROSCOPIC  GC/CHLAMYDIA PROBE AMP ([place])    Imaging Review No results found.   EKG Interpretation None      MDM   Final diagnoses:  Hyperglycemia  Vaginal bleeding    49 year old female with vaginal bleeding and incidentally noted to be hyperglycemic. Normal anion gap. Bicarbonate is normal. Plan IV fluids and insulin. Patient is been noncompliant with her medications she's having difficulty affording him. Discussed with social work. 70/30 insulin would be $25 a month. Patient states that she can afford this. Discussed with pharmacy for appropriate dosing.     Virgel Manifold, MD 12/23/14 [phone]

## 2014-12-24 LAB — URINE CULTURE: Colony Count: 6000

## 2014-12-24 LAB — GC/CHLAMYDIA PROBE AMP (~~LOC~~) NOT AT ARMC
Chlamydia: NEGATIVE
Neisseria Gonorrhea: NEGATIVE

## 2014-12-24 LAB — RPR: RPR Ser Ql: NONREACTIVE

## 2014-12-24 NOTE — Care Management Note (Signed)
    Page 1 of 1   12/23/2014     3:31:57 PM CARE MANAGEMENT NOTE 12/23/2014  Patient:  SHAVAUGHN, SEIDL   Account Number:  [id number]  Date Initiated:  12/23/2014  Documentation initiated by:  Jolene Provost  Subjective/Objective Assessment:   Referred for med needs. Pt uninsured, cant afford insulin, [facility]. Discussed med options with MD. He will change her to 70/30 and will prescribe needed meds off $4 list at [place].     Action/Plan:   referral made to fin. counselor.   Anticipated DC Date:  12/23/2014   Anticipated DC Plan:    In-house referral  Financial Counselor         Choice offered to / List presented to:             Status of service:  Completed, signed off Medicare Important Message given?   (If response is "NO", the following Medicare IM given date fields will be blank) Date Medicare IM given:   Medicare IM given by:   Date Additional Medicare IM given:   Additional Medicare IM given by:    Discharge Disposition:  HOME/SELF CARE  Per UR Regulation:    If discussed at Long Length of Stay Meetings, dates discussed:    Comments:  12/23/2014 [place], RN, MSN, CM

## 2015-05-05 ENCOUNTER — Encounter (HOSPITAL_COMMUNITY): Payer: Self-pay | Admitting: Emergency Medicine

## 2015-05-05 ENCOUNTER — Emergency Department (HOSPITAL_COMMUNITY)
Admission: EM | Admit: 2015-05-05 | Discharge: 2015-05-05 | Disposition: A | Discharge status: Home / Self Care | Payer: Medicaid Other | Attending: Emergency Medicine | Admitting: Emergency Medicine

## 2015-05-05 DIAGNOSIS — Z791 Long term (current) use of non-steroidal anti-inflammatories (NSAID): Secondary | ICD-10-CM | POA: Diagnosis not present

## 2015-05-05 DIAGNOSIS — Z79899 Other long term (current) drug therapy: Secondary | ICD-10-CM | POA: Insufficient documentation

## 2015-05-05 DIAGNOSIS — M25462 Effusion, left knee: Secondary | ICD-10-CM | POA: Insufficient documentation

## 2015-05-05 DIAGNOSIS — M25561 Pain in right knee: Secondary | ICD-10-CM | POA: Diagnosis not present

## 2015-05-05 DIAGNOSIS — E119 Type 2 diabetes mellitus without complications: Secondary | ICD-10-CM | POA: Insufficient documentation

## 2015-05-05 DIAGNOSIS — K219 Gastro-esophageal reflux disease without esophagitis: Secondary | ICD-10-CM | POA: Diagnosis not present

## 2015-05-05 DIAGNOSIS — M2550 Pain in unspecified joint: Secondary | ICD-10-CM | POA: Diagnosis present

## 2015-05-05 DIAGNOSIS — M25432 Effusion, left wrist: Secondary | ICD-10-CM | POA: Insufficient documentation

## 2015-05-05 DIAGNOSIS — M25531 Pain in right wrist: Secondary | ICD-10-CM | POA: Insufficient documentation

## 2015-05-05 DIAGNOSIS — I10 Essential (primary) hypertension: Secondary | ICD-10-CM | POA: Insufficient documentation

## 2015-05-05 DIAGNOSIS — E785 Hyperlipidemia, unspecified: Secondary | ICD-10-CM | POA: Diagnosis not present

## 2015-05-05 DIAGNOSIS — Z794 Long term (current) use of insulin: Secondary | ICD-10-CM | POA: Insufficient documentation

## 2015-05-05 DIAGNOSIS — Z720 Tobacco use: Secondary | ICD-10-CM | POA: Insufficient documentation

## 2015-05-05 DIAGNOSIS — J449 Chronic obstructive pulmonary disease, unspecified: Secondary | ICD-10-CM | POA: Diagnosis not present

## 2015-05-05 DIAGNOSIS — M25562 Pain in left knee: Secondary | ICD-10-CM | POA: Diagnosis not present

## 2015-05-05 LAB — I-STAT CHEM 8, ED
BUN: 8 mg/dL (ref 6–20)
CALCIUM ION: 1.31 mmol/L — AB (ref 1.12–1.23)
Chloride: 104 mmol/L (ref 101–111)
Creatinine, Ser: 1.1 mg/dL — ABNORMAL HIGH (ref 0.44–1.00)
Glucose, Bld: 107 mg/dL — ABNORMAL HIGH (ref 65–99)
HCT: 39 % (ref 36.0–46.0)
Hemoglobin: 13.3 g/dL (ref 12.0–15.0)
Potassium: 3.7 mmol/L (ref 3.5–5.1)
SODIUM: 141 mmol/L (ref 135–145)
TCO2: 22 mmol/L (ref 0–100)

## 2015-05-05 MED ORDER — PREDNISONE 50 MG PO TABS
60.0000 mg | ORAL_TABLET | ORAL | Status: AC
  Administered 2015-05-05: 60 mg via ORAL
  Filled 2015-05-05 (×2): qty 1

## 2015-05-05 MED ORDER — PREDNISONE 20 MG PO TABS: 60.0000 mg | ORAL_TABLET | Freq: Every day | ORAL | Status: AC

## 2015-05-05 NOTE — ED Provider Notes (Signed)
CSN: [id number]     Arrival date & time 05/05/15  1004 History  This chart was scribed for Carmin Muskrat, MD by Terressa Koyanagi, ED Scribe. This patient was seen in room [facility]14[facility]14 and the patient's care was started at 10:21 AM.   Chief Complaint  Patient presents with  . Joint Pain   HPI PCP: Alphia Kava HPI Comments: Sonya James is a 49 y.o. female, with PMHx noted below including carpal tunnel, DMTII, HTN, hypercholesterolemia, chronic bilateral knee pain, who presents to the Emergency Department complaining of atraumatic, recurrent, worsening bilateral hand pain, bilateral wrist pain, bilateral feet pain and bilateral knee pain onset one week ago. Pt also reports recurring, intermittent numbness and tingling to BLE and bilateral fingers. Pt reports taking gabapentin for her Sx without relief. Pt further notes her PCP also started her on celebrex, however, discontinued use because she did not tolerate the med as it made her dizzy. Pt denies change in diet, change in meds (however, pt reports her DM meds was increased yesterday), fever, rash, swelling of extremities, rash, skin color change, or any other Sx at this time.   Past Medical History  Diagnosis Date  . HTN (hypertension)   . GERD (gastroesophageal reflux disease)   . Constipation   . Hypercholesterolemia   . Asthma   . COPD (chronic obstructive pulmonary disease)   . Shortness of breath   . Headache(784.0)   . Osteoarthritis   . Diabetes mellitus without complication    Past Surgical History  Procedure Laterality Date  . Cesarean section    . Breast reduction surgery    . Tubal ligation    . Esophagogastroduodenoscopy  05/22/2011    Procedure: ESOPHAGOGASTRODUODENOSCOPY (EGD);  Surgeon: Dorothyann Peng, MD;  Location: [facility];  Service: Endoscopy;  Laterality: N/A;  . Pancreatitis     Family History  Problem Relation Age of Onset  . Colon cancer Neg Hx   . Diabetes Mother    Social History   Substance Use Topics  . Smoking status: Current Every Day Smoker -- 1.00 packs/day for 22 years    Types: Cigarettes  . Smokeless tobacco: Current User  . Alcohol Use: No   OB History    No data available     Review of Systems  Constitutional: Negative for fever.       Per HPI, otherwise negative  HENT:       Per HPI, otherwise negative  Respiratory:       Per HPI, otherwise negative  Cardiovascular: Negative for leg swelling.       Per HPI, otherwise negative  Gastrointestinal: Negative for vomiting.  Endocrine:       Negative aside from HPI  Genitourinary:       Neg aside from HPI   Musculoskeletal: Positive for myalgias (bilateral knee, bilateral hands, bilateral feet, bialteral ankles).       Per HPI, otherwise negative  Skin: Negative.  Negative for color change and rash.  Neurological: Positive for numbness (BLE and bilateral fingers ). Negative for syncope.   Allergies  Review of patient's allergies indicates no known allergies.  Home Medications   Prior to Admission medications   Medication Sig Start Date End Date Taking? Authorizing Provider  albuterol (PROVENTIL) (2.5 MG/3ML) 0.083% nebulizer solution Take 3 mLs (2.5 mg total) by nebulization every 6 (six) hours as needed for wheezing. 07/05/14   Kathie Dike, MD  esomeprazole (NEXIUM) 40 MG capsule Take 1 capsule (40  mg total) by mouth every morning. 07/05/14   Kathie Dike, MD  furosemide (LASIX) 40 MG tablet Take 1 tablet (40 mg total) by mouth daily. 05/27/13   Kathie Dike, MD  gabapentin (NEURONTIN) 300 MG capsule Take 300 mg by mouth 3 (three) times daily.    Historical Provider, MD  glipiZIDE (GLUCOTROL) 10 MG tablet Take 1 tablet (10 mg total) by mouth daily before breakfast. Patient not taking: Reported on 12/23/2014 07/05/14   Kathie Dike, MD  guaiFENesin (MUCINEX) 600 MG 12 hr tablet Take 1 tablet (600 mg total) by mouth 2 (two) times daily. 07/05/14   Kathie Dike, MD  insulin aspart  (NOVOLOG) 100 UNIT/ML injection Inject 2-6 Units into the skin 3 (three) times daily before meals. Per sliding scale 07/05/14   Kathie Dike, MD  insulin detemir (LEVEMIR) 100 UNIT/ML injection Inject 0.35 mLs (35 Units total) into the skin at bedtime. 07/05/14   Kathie Dike, MD  insulin NPH-regular Human (NOVOLIN 70/30) (70-30) 100 UNIT/ML injection Inject 13 Units into the skin 2 (two) times daily with a meal. 12/23/14   Virgel Manifold, MD  levofloxacin (LEVAQUIN) 500 MG tablet Take 1 tablet (500 mg total) by mouth daily. Patient not taking: Reported on 12/23/2014 07/05/14   Kathie Dike, MD  lisinopril (PRINIVIL,ZESTRIL) 20 MG tablet Take 1 tablet (20 mg total) by mouth daily. 07/05/14   Kathie Dike, MD  medroxyPROGESTERone (PROVERA) 5 MG tablet Take 1 tablet (5 mg total) by mouth daily. 12/23/14   Virgel Manifold, MD  meloxicam (MOBIC) 7.5 MG tablet Take 1 tablet (7.5 mg total) by mouth daily. 01/12/14   Tanna Furry, MD  metFORMIN (GLUCOPHAGE) 1000 MG tablet Take 1 tablet (1,000 mg total) by mouth 2 (two) times daily with a meal. 07/05/14   Kathie Dike, MD  potassium chloride (KLOR-CON) 10 MEQ CR tablet Take 1 tablet (10 mEq total) by mouth 2 (two) times daily. 07/05/14   Kathie Dike, MD  predniSONE (DELTASONE) 10 MG tablet Take 40mg  po daily for 2 days then 30mg  po daily for 2 days then 20mg  po daily for 2 days then 10mg  po daily for 2 days then stop Patient not taking: Reported on 12/23/2014 07/05/14   Kathie Dike, MD  rosuvastatin (CRESTOR) 40 MG tablet Take 1 tablet (40 mg total) by mouth daily. 07/05/14   Kathie Dike, MD   Triage Vitals: BP 140/77 mmHg  Pulse 73  Temp(Src) 97.9 F (36.6 C) (Oral)  Resp 18  Ht 5\' 5"  (1.651 m)  Wt 270 lb (122.471 kg)  BMI 44.93 kg/m2  SpO2 97%  LMP 04/19/2015 Physical Exam  Constitutional: She is oriented to person, place, and time. She appears well-developed and well-nourished. No distress.  HENT:  Head: Normocephalic and atraumatic.   Eyes: Conjunctivae and EOM are normal.  Neck:  No cervical spine tenderness   Cardiovascular: Normal rate and regular rhythm.   Pulmonary/Chest: Effort normal and breath sounds normal. No stridor. No respiratory distress.  Abdominal: She exhibits no distension.  Musculoskeletal: She exhibits edema and tenderness.  Mild crepitus of left knee  Mild joint effusion of left knee. Tender to palpation of bilateral wrists, bilateral knees.   Lymphadenopathy:    She has no cervical adenopathy.  Neurological: She is alert and oriented to person, place, and time. No cranial nerve deficit.  Skin: Skin is warm and dry.  Psychiatric: She has a normal mood and affect.  Nursing note and vitals reviewed.   ED Course  Procedures (including critical care  time) 10:36 AM: Discussed treatment plan which includes prednisone with pt at bedside; patient verbalizes understanding and agrees with treatment plan.  MDM   Final diagnoses:  Polyarthralgia   I personally performed the services described in this documentation, which was scribed in my presence. The recorded information has been reviewed and is accurate.  Patient presents with polyarthralgia. No evidence for septic arthritis, bacteremia, sepsis. No history of trauma before the event. Labs do not demonstrate hyperglycemia, and low suspicion for substantial collateral abnormalities. With history of chronic pain, no improvement, patient started on a course of steroids, will follow up with primary care.  Carmin Muskrat, MD 05/05/15 1113

## 2015-05-05 NOTE — ED Notes (Addendum)
Pt c/o worsening pain in bilateral hands, knees and feet x 1 week. Pt reports hx of carpal tunnel in left hand.

## 2015-05-05 NOTE — ED Notes (Signed)
Pt reports bilateral hand and knee pain x1 week. Pt denies any recent injury. nad noted.

## 2015-05-05 NOTE — Discharge Instructions (Signed)
As discussed, your evaluation today has been largely reassuring.  But, it is important that you monitor your condition carefully, and do not hesitate to return to the ED if you develop new, or concerning changes in your condition. ? ?Otherwise, please follow-up with your physician for appropriate ongoing care. ? ?

## 2015-09-12 DIAGNOSIS — C541 Malignant neoplasm of endometrium: Secondary | ICD-10-CM

## 2015-09-12 DIAGNOSIS — C55 Malignant neoplasm of uterus, part unspecified: Secondary | ICD-10-CM

## 2015-09-12 HISTORY — DX: Malignant neoplasm of endometrium: C54.1

## 2015-09-12 HISTORY — DX: Malignant neoplasm of uterus, part unspecified: C55

## 2015-09-27 ENCOUNTER — Encounter: Payer: Self-pay | Admitting: Obstetrics and Gynecology

## 2015-09-27 ENCOUNTER — Ambulatory Visit (INDEPENDENT_AMBULATORY_CARE_PROVIDER_SITE_OTHER): Payer: Medicaid Other | Admitting: Obstetrics and Gynecology

## 2015-09-27 VITALS — BP 142/90 | Ht 65.0 in | Wt 290.5 lb

## 2015-09-27 DIAGNOSIS — N920 Excessive and frequent menstruation with regular cycle: Secondary | ICD-10-CM | POA: Diagnosis not present

## 2015-09-27 NOTE — Progress Notes (Signed)
Patient ID: Sonya James, female   DOB: 1966/04/02, 50 y.o.   MRN: [id number]   [facility] Visit  Patient name: Sonya James MRN [id number]  Date of birth: September 15, 1965  CC & HPI:  Sonya James is a 50 y.o. female with h/o HTN, COPD, DM, cesarean section, tubal ligation presenting today for daily AUB for 10 months. Pt notes her menses were 5-6 days with a moderate flow prior to her tubal ligation in 1989. She reports that her periods now have a heavy flow with clotting, lasting 3 days, followed by spotting throughout the month. LMP began 4 days ago- denoted by a heavier flow with clotting. She states her periods are regular at every 28 days. [facility]   ROS:  A complete 10 system review of systems was obtained and all systems are negative except as noted in the HPI and PMH.    Pertinent History Reviewed:   Reviewed: Significant for HTN, COPD, DM, cesarean section, tubal ligation  Medical         Past Medical History  Diagnosis Date  . HTN (hypertension)   . GERD (gastroesophageal reflux disease)   . Constipation   . Hypercholesterolemia   . Asthma   . COPD (chronic obstructive pulmonary disease) ([place])   . Shortness of breath   . Headache(784.0)   . Osteoarthritis   . Diabetes mellitus without complication ([place])   . IBS (irritable bowel syndrome)   . Anxiety   . Depression                               Surgical Hx:    Past Surgical History  Procedure Laterality Date  . Cesarean section    . Breast reduction surgery    . Tubal ligation    . Esophagogastroduodenoscopy  05/22/2011    Procedure: ESOPHAGOGASTRODUODENOSCOPY (EGD);  Surgeon: Dorothyann Peng, MD;  [facility];  Service: Endoscopy;  Laterality: N/A;  . Pancreatitis     Medications: Reviewed & Updated - see associated section                       Current outpatient prescriptions:  .  albuterol (PROVENTIL) (2.5 MG/3ML) 0.083% nebulizer solution, Take  3 mLs (2.5 mg total) by nebulization every 6 (six) hours as needed for wheezing., Disp: 75 mL, Rfl: 12 .  esomeprazole (NEXIUM) 40 MG capsule, Take 1 capsule (40 mg total) by mouth every morning., Disp: 30 capsule, Rfl: 0 .  furosemide (LASIX) 40 MG tablet, Take 1 tablet (40 mg total) by mouth daily., Disp: 30 tablet, Rfl: 0 .  gabapentin (NEURONTIN) 300 MG capsule, Take 300 mg by mouth 3 (three) times daily., Disp: , Rfl:  .  glipiZIDE (GLUCOTROL) 10 MG tablet, Take 1 tablet (10 mg total) by mouth daily before breakfast., Disp: 30 tablet, Rfl: 0 .  insulin NPH-regular Human (NOVOLIN 70/30) (70-30) 100 UNIT/ML injection, Inject 13 Units into the skin 2 (two) times daily with a meal., Disp: 10 mL, Rfl: 11 .  lisinopril (PRINIVIL,ZESTRIL) 20 MG tablet, Take 1 tablet (20 mg total) by mouth daily., Disp: 30 tablet, Rfl: 0 .  meloxicam (MOBIC) 7.5 MG tablet, Take 1 tablet (7.5 mg total) by mouth daily., Disp: 28 tablet, Rfl: 0 .  metFORMIN (GLUCOPHAGE) 1000 MG tablet, Take 1 tablet (1,000 mg total) by  mouth 2 (two) times daily with a meal., Disp: 60 tablet, Rfl: 0 .  potassium chloride (KLOR-CON) 10 MEQ CR tablet, Take 1 tablet (10 mEq total) by mouth 2 (two) times daily., Disp: 60 tablet, Rfl: 0 .  rosuvastatin (CRESTOR) 40 MG tablet, Take 1 tablet (40 mg total) by mouth daily., Disp: 30 tablet, Rfl: 0   Social History: Reviewed -  reports that she has been smoking Cigarettes.  She has a 5.5 pack-year smoking history. She has never used smokeless tobacco.  Objective Findings:  Vitals: Blood pressure 142/90, height 5\' 5"  (1.651 m), weight 290 lb 8 oz (131.77 kg), last menstrual period 09/20/2015.  Physical Examination: General appearance - alert, well appearing, and in no distress Mental status - alert, oriented to person, place, and time Abdomen - soft, nontender, nondistended, no masses or organomegaly Pelvic - normal external genitalia, vulva, vagina, cervix, uterus and adnexa,  VULVA: normal  appearing vulva with no masses, tenderness or lesions,  VAGINA: normal appearing vagina with normal color and discharge, no lesions,  CERVIX: normal appearing cervix without discharge or lesions,  UTERUS: uterus is normal size, shape, consistency and nontender, anterior, moderately enlarged, anteflexed 8 wk sized 150g uterus  ADNEXA: normal adnexa in size, nontender and no masses  Discussed with pt weight loss strategies. At end of discussion, pt had opportunity to ask questions and has no further questions at this time.   Greater than 50% was spent in counseling and coordination of care with the patient. Total time greater than: 15 minutes    Assessment & Plan:   A:  1. Daily AUB for 10 months 2. Anteflexed 8 wk sized 150g uterus   P:  1. Will order pelvic US in the next 2 weeks 2. Follow up in 2 weeks for pap smear with endometrial biopsy     By signing my name below, I, Hansel Feinstein, attest that this documentation has been prepared under the direction and in the presence of Jonnie Kind, MD. Electronically Signed: Hansel Feinstein, ED Scribe. 09/27/2015. 3:42 PM.  I personally performed the services described in this documentation, which was SCRIBED in my presence. The recorded information has been reviewed and considered accurate. It has been edited as necessary during review. Jonnie Kind, MD

## 2015-09-27 NOTE — Progress Notes (Signed)
Patient ID: Sonya James, female   DOB: 1966/04/23, 50 y.o.   MRN: [id number] Pt here today for abnormal bleeding. Pt states that she has some bleeding almost every day. Pt states that she has a regular period most months and she has irregular bleeding. Pt states that she started having the bleeding problems after she had her tubes tied. Pt states that she could have no bleeding now and by the end of the day she could have the bleeding.

## 2015-10-01 ENCOUNTER — Other Ambulatory Visit: Payer: Self-pay | Admitting: Obstetrics and Gynecology

## 2015-10-01 DIAGNOSIS — N852 Hypertrophy of uterus: Secondary | ICD-10-CM

## 2015-10-01 DIAGNOSIS — N939 Abnormal uterine and vaginal bleeding, unspecified: Secondary | ICD-10-CM

## 2015-10-04 ENCOUNTER — Ambulatory Visit (INDEPENDENT_AMBULATORY_CARE_PROVIDER_SITE_OTHER): Payer: Medicaid Other

## 2015-10-04 DIAGNOSIS — N852 Hypertrophy of uterus: Secondary | ICD-10-CM | POA: Diagnosis not present

## 2015-10-04 DIAGNOSIS — N939 Abnormal uterine and vaginal bleeding, unspecified: Secondary | ICD-10-CM

## 2015-10-04 NOTE — Progress Notes (Signed)
US PELVIC TA/TV: heterogenous anteverted uterus,mult fibroids (#1) intramural fibroid post rt 3.4 x 1.6 x 2.8 cm,(#2) ant rt intramural fibroid 1.6 x 1.6 x 1.9cm,normal ov's bilat (mobile), thick complex endometrium w/ color flow 18.9mm,no pain during ultrasound

## 2015-10-11 ENCOUNTER — Other Ambulatory Visit (HOSPITAL_COMMUNITY)
Admission: RE | Admit: 2015-10-11 | Discharge: 2015-10-11 | Disposition: A | Discharge status: Home / Self Care | Payer: Medicaid Other | Source: Ambulatory Visit | Attending: Obstetrics and Gynecology | Admitting: Obstetrics and Gynecology

## 2015-10-11 ENCOUNTER — Other Ambulatory Visit: Payer: Self-pay | Admitting: Obstetrics and Gynecology

## 2015-10-11 ENCOUNTER — Ambulatory Visit (INDEPENDENT_AMBULATORY_CARE_PROVIDER_SITE_OTHER): Payer: Medicaid Other | Admitting: Obstetrics and Gynecology

## 2015-10-11 ENCOUNTER — Encounter: Payer: Self-pay | Admitting: Obstetrics and Gynecology

## 2015-10-11 VITALS — BP 142/90 | Ht 65.0 in | Wt 290.0 lb

## 2015-10-11 DIAGNOSIS — Z01411 Encounter for gynecological examination (general) (routine) with abnormal findings: Secondary | ICD-10-CM | POA: Diagnosis present

## 2015-10-11 DIAGNOSIS — Z1151 Encounter for screening for human papillomavirus (HPV): Secondary | ICD-10-CM | POA: Diagnosis not present

## 2015-10-11 DIAGNOSIS — C541 Malignant neoplasm of endometrium: Secondary | ICD-10-CM

## 2015-10-11 DIAGNOSIS — Z113 Encounter for screening for infections with a predominantly sexual mode of transmission: Secondary | ICD-10-CM | POA: Insufficient documentation

## 2015-10-11 DIAGNOSIS — N921 Excessive and frequent menstruation with irregular cycle: Secondary | ICD-10-CM | POA: Diagnosis not present

## 2015-10-11 DIAGNOSIS — N920 Excessive and frequent menstruation with regular cycle: Secondary | ICD-10-CM

## 2015-10-11 HISTORY — DX: Excessive and frequent menstruation with regular cycle: N92.0

## 2015-10-11 MED ORDER — ONDANSETRON 4 MG PO TBDP: 4.0000 mg | ORAL_TABLET | Freq: Four times a day (QID) | ORAL | Status: DC | PRN

## 2015-10-11 MED ORDER — TRAMADOL HCL 50 MG PO TABS: 50.0000 mg | ORAL_TABLET | Freq: Four times a day (QID) | ORAL | Status: DC | PRN

## 2015-10-11 NOTE — Patient Instructions (Signed)
Endometrial Biopsy Endometrial biopsy is a procedure in which a tissue sample is taken from inside the uterus. The tissue sample is then looked at under a microscope to see if the tissue is normal or abnormal. The endometrium is the lining of the uterus. This procedure helps determine where you are in your menstrual cycle and how hormone levels are affecting the lining of the uterus. This procedure may also be used to evaluate uterine bleeding or to diagnose endometrial cancer, tuberculosis, polyps, or inflammatory conditions.  LET YOUR HEALTH CARE PROVIDER KNOW ABOUT:  Any allergies you have.  All medicines you are taking, including vitamins, herbs, eye drops, creams, and over-the-counter medicines.  Previous problems you or members of your family have had with the use of anesthetics.  Any blood disorders you have.  Previous surgeries you have had.  Medical conditions you have.  Possibility of pregnancy. RISKS AND COMPLICATIONS Generally, this is a safe procedure. However, as with any procedure, complications can occur. Possible complications include:  Bleeding.  Pelvic infection.  Puncture of the uterine wall with the biopsy device (rare). BEFORE THE PROCEDURE   Keep a record of your menstrual cycles as directed by your health care provider. You may need to schedule your procedure for a specific time in your cycle.  You may want to bring a sanitary pad to wear home after the procedure.  Arrange for someone to drive you home after the procedure if you will be given a medicine to help you relax (sedative). PROCEDURE   You may be given a sedative to relax you.  You will lie on an exam table with your feet and legs supported as in a pelvic exam.  Your health care provider will insert an instrument (speculum) into your vagina to see your cervix.  Your cervix will be cleansed with an antiseptic solution. A medicine (local anesthetic) will be used to numb the cervix.  A forceps  instrument (tenaculum) will be used to hold your cervix steady for the biopsy.  A thin, rodlike instrument (uterine sound) will be inserted through your cervix to determine the length of your uterus and the location where the biopsy sample will be removed.  A thin, flexible tube (catheter) will be inserted through your cervix and into the uterus. The catheter is used to collect the biopsy sample from your endometrial tissue.  The catheter and speculum will then be removed, and the tissue sample will be sent to a lab for examination. AFTER THE PROCEDURE  You will rest in a recovery area until you are ready to go home.  You may have mild cramping and a small amount of vaginal bleeding for a few days after the procedure. This is normal.  Make sure you find out how to get your test results.   This information is not intended to replace advice given to you by your health care provider. Make sure you discuss any questions you have with your health care provider.   Document Released: 12/29/2004 Document Revised: 04/30/2013 Document Reviewed: 02/12/2013 Elsevier Interactive Patient Education 2016 [employer]  

## 2015-10-11 NOTE — Progress Notes (Signed)
Patient ID: Sonya James, female   DOB: [birth year], 50 y.o.   MRN: [id number]   [facility] Visit  Patient name: Sonya James MRN [id number]  Date of birth: [birth year]  CC & HPI:  Sonya James is a 50 y.o. female presenting today for further eval of her menometrorhagia, she had u/s last week. She had u/s last week results reviewed   Sonya James is a 50 y.o. LMP 09/19/2015 for a pelvic sonogram for abnormal uterine bleeding .  Uterus 9.9 x 6.1 x 6.6 cm,heterogenous anteverted uterus,mult fibroids (#1) intramural fibroid post rt 3.4 x 1.6 x 2.8 cm,(#2) ant rt intramural fibroid 1.6 x 1.6 x 1.9cm   Endometrium 18.8 mm,thick complex endometrium w/increased color flow seen   Right ovary 2.1 x 2.9 x 1.3 cm, wnl  Left ovary 2.5 x 3.3 x 3 cm, wnl  No free fluid,mult simple nabothian cysts  Technician Comments:  US PELVIC TA/TV: heterogenous anteverted uterus,mult fibroids (#1) intramural fibroid post rt 3.4 x 1.6 x 2.8 cm,(#2) ant rt intramural fibroid 1.6 x 1.6 x 1.9cm,normal ov's bilat (mobile), thick complex endometrium w/increased color flow seen 18.70mm,mult simple nabothian cysts,no pain during ultrasound,no free fluid seen     [employer] 10/04/2015 10:36 AM  Clinical Impression and recommendations:  I have reviewed the sonogram results above, combined with the patient's current clinical course, below are my impressions and any appropriate recommendations for management based on the sonographic findings.  Uterus with small fibroids present, overall size is not enlarged Thickened endometrium consistent with AUB Normal ovaries No free fluid   [name],Sonya James 10/06/2015 8:12 PM  ROS:  Pt needs pap as well as review and endo bx  Pertinent History Reviewed:   Reviewed: Significant for obesity,  Medical         Past Medical History  Diagnosis Date  . HTN (hypertension)   . GERD (gastroesophageal reflux  disease)   . Constipation   . Hypercholesterolemia   . Asthma   . COPD (chronic obstructive pulmonary disease) ([place])   . Shortness of breath   . Headache(784.0)   . Osteoarthritis   . Diabetes mellitus without complication ([place])   . IBS (irritable bowel syndrome)   . Anxiety   . Depression                               Surgical Hx:    Past Surgical History  Procedure Laterality Date  . Cesarean section    . Breast reduction surgery    . Tubal ligation    . Esophagogastroduodenoscopy  05/22/2011    Procedure: ESOPHAGOGASTRODUODENOSCOPY (EGD);  Surgeon: Dorothyann Peng, MD;  [facility];  Service: Endoscopy;  Laterality: N/A;  . Pancreatitis     Medications: Reviewed & Updated - see associated section                       Current outpatient prescriptions:  .  albuterol (PROVENTIL) (2.5 MG/3ML) 0.083% nebulizer solution, Take 3 mLs (2.5 mg total) by nebulization every 6 (six) hours as needed for wheezing., Disp: 75 mL, Rfl: 12 .  esomeprazole (NEXIUM) 40 MG capsule, Take 1 capsule (40 mg total) by mouth every morning., Disp: 30 capsule, Rfl: 0 .  furosemide (LASIX) 40 MG tablet, Take 1 tablet (40 mg total) by mouth daily., Disp: 30 tablet, Rfl: 0 .  gabapentin (NEURONTIN) 300  MG capsule, Take 300 mg by mouth 3 (three) times daily., Disp: , Rfl:  .  glipiZIDE (GLUCOTROL) 10 MG tablet, Take 1 tablet (10 mg total) by mouth daily before breakfast., Disp: 30 tablet, Rfl: 0 .  insulin NPH-regular Human (NOVOLIN 70/30) (70-30) 100 UNIT/ML injection, Inject 13 Units into the skin 2 (two) times daily with a meal., Disp: 10 mL, Rfl: 11 .  lisinopril (PRINIVIL,ZESTRIL) 20 MG tablet, Take 1 tablet (20 mg total) by mouth daily., Disp: 30 tablet, Rfl: 0 .  meloxicam (MOBIC) 7.5 MG tablet, Take 1 tablet (7.5 mg total) by mouth daily., Disp: 28 tablet, Rfl: 0 .  metFORMIN (GLUCOPHAGE) 1000 MG tablet, Take 1 tablet (1,000 mg total) by mouth 2 (two) times daily with a meal., Disp: 60 tablet,  Rfl: 0 .  potassium chloride (KLOR-CON) 10 MEQ CR tablet, Take 1 tablet (10 mEq total) by mouth 2 (two) times daily., Disp: 60 tablet, Rfl: 0 .  rosuvastatin (CRESTOR) 40 MG tablet, Take 1 tablet (40 mg total) by mouth daily., Disp: 30 tablet, Rfl: 0   Social History: Reviewed -  reports that she has been smoking Cigarettes.  She has a 5.5 pack-year smoking history. She has never used smokeless tobacco.  Objective Findings:  Vitals: Blood pressure 142/90, height 5\' 5"  (1.651 m), weight 290 lb (131.543 kg), last menstrual period 09/20/2015.  Physical Examination: General appearance - alert, well appearing, and in no distress, oriented to person, place, and time and overweight Mental status - alert, oriented to person, place, and time, normal mood, behavior, speech, dress, motor activity, and thought processes Chest - clear to auscultation, no wheezes, rales or rhonchi, symmetric air entry Abdomen - soft, nontender, nondistended, no masses or organomegaly Pelvic - VULVA: normal appearing vulva with no masses, tenderness or lesions, VAGINA: normal appearing vagina with normal color and discharge, no lesions, CERVIX: normal appearing cervix without discharge or lesions, lesions absent, DNA probe for chlamydia and GC obtained, multiparous os, pap collected , UTERUS: uterus is normal size, shape, consistency and nontender, anteverted, enlarged to 8 week's size, see biopsy results  ADNEXA: normal adnexa in size, nontender and no masses, RECTAL: normal rectal, no masses, exam limited by weight Extremities - peripheral pulses normal, no pedal edema, no clubbing or cyanosis Skin - normal coloration and turgor, no rashes, no suspicious skin lesions noted  Endometrial biopsy is performed.  Endometrial Biopsy: Patient given informed consent, signed copy in the chart, time out was performed. Time out taken. The patient was placed in the lithotomy position and the cervix brought into view with sterile  speculum.  Portio of cervix cleansed x 2 with betadine swabs.  A tenaculum was placed in the anterior lip of the cervix. The uterus was sounded for depth of 9 cm,. Milex uterine Explora 3 mm was introduced to into the uterus, suction created,  and an endometrial sample was obtained. All equipment was removed and accounted for.   The patient tolerated the procedure well, but returned to the office with cramping after going to waiting room, and vomited due to discomfort, was given Rx for Tramadol and Zofran    Patient given post procedure instructions.  Followup: 1 wk    Assessment & Plan:   A:  1. Pap 2  menorrhaqgia 3 pain p endo bx  P:  1. Tramadol 50 Rx 2   zofran  F/u 1 wk results

## 2015-10-12 ENCOUNTER — Telehealth: Payer: Self-pay | Admitting: Obstetrics and Gynecology

## 2015-10-12 ENCOUNTER — Other Ambulatory Visit: Payer: Medicaid Other | Admitting: Obstetrics and Gynecology

## 2015-10-12 LAB — CYTOLOGY - PAP

## 2015-10-12 NOTE — Telephone Encounter (Signed)
Telephone call to Dr Serita Grit office , referring Sonya James to her office for treatment for Recent dx'd Endometrial Cancer, Endometrioid Ca Figo Grade I. Pt has also been informed of the results, told to expect a call for appointment with Dr Denman George, and that Hysterectomy is usual treatment for this.

## 2015-10-12 NOTE — Telephone Encounter (Signed)
Endometrial biopsy: well differentiated endometrioid Cancer, Grade I found on yesterday's biopsy. I will notify pt.

## 2015-10-14 ENCOUNTER — Telehealth: Payer: Self-pay | Admitting: *Deleted

## 2015-10-14 NOTE — Telephone Encounter (Signed)
Notified patient of future appointment scheduled 10/25/15 with Dr. Denman George at 40:45a.m. Pt agreed with time and date of appointmnet.

## 2015-10-18 ENCOUNTER — Encounter: Payer: Self-pay | Admitting: Obstetrics and Gynecology

## 2015-10-18 ENCOUNTER — Ambulatory Visit: Payer: Medicaid Other | Admitting: Obstetrics and Gynecology

## 2015-10-18 ENCOUNTER — Ambulatory Visit (INDEPENDENT_AMBULATORY_CARE_PROVIDER_SITE_OTHER): Payer: Medicaid Other | Admitting: Obstetrics and Gynecology

## 2015-10-18 VITALS — BP 136/80 | Ht 65.0 in | Wt 289.0 lb

## 2015-10-18 DIAGNOSIS — C541 Malignant neoplasm of endometrium: Secondary | ICD-10-CM | POA: Insufficient documentation

## 2015-10-18 HISTORY — DX: Malignant neoplasm of endometrium: C54.1

## 2015-10-18 NOTE — Progress Notes (Signed)
Patient ID: Sonya James, female   DOB: June 13, 1966, 50 y.o.   MRN: [id number]   [facility] Visit  Patient name: Sonya James MRN [id number]  Date of birth: 1966-04-21 CC & HPI:  Sonya James is a 50 y.o. female, presenting today, accompanied by her mother, for discussion regarding having a laparoscopic hysterectomy for a positive endometrial biopsy that showed endometrial cancer FIGO grade 1. Pt reports she has an appointment with Dr. Denman George on 10/25/15.   ROS:  10 Systems reviewed and all are negative for acute change except as noted in the HPI. 15 minutes spent in focused conversation over need for pt to get serious about the obesity and poor dietary habits, due to her diabetes, and its predictable consequences Pertinent History Reviewed:   Reviewed: Significant for DM, cesarean section, HTN, endometrial cancer  Medical         Past Medical History  Diagnosis Date  . HTN (hypertension)   . GERD (gastroesophageal reflux disease)   . Constipation   . Hypercholesterolemia   . Asthma   . COPD (chronic obstructive pulmonary disease) ([place])   . Shortness of breath   . Headache(784.0)   . Osteoarthritis   . Diabetes mellitus without complication ([place])   . IBS (irritable bowel syndrome)   . Anxiety   . Depression                               Surgical Hx:    Past Surgical History  Procedure Laterality Date  . Cesarean section    . Breast reduction surgery    . Tubal ligation    . Esophagogastroduodenoscopy  05/22/2011    Procedure: ESOPHAGOGASTRODUODENOSCOPY (EGD);  Surgeon: Dorothyann Peng, MD;  [facility];  Service: Endoscopy;  Laterality: N/A;  . Pancreatitis     Medications: Reviewed & Updated - see associated section                       Current outpatient prescriptions:  .  albuterol (PROVENTIL) (2.5 MG/3ML) 0.083% nebulizer solution, Take 3 mLs (2.5 mg total) by nebulization every 6 (six) hours as needed for wheezing., Disp: 75 mL, Rfl: 12 .   esomeprazole (NEXIUM) 40 MG capsule, Take 1 capsule (40 mg total) by mouth every morning., Disp: 30 capsule, Rfl: 0 .  furosemide (LASIX) 40 MG tablet, Take 1 tablet (40 mg total) by mouth daily., Disp: 30 tablet, Rfl: 0 .  gabapentin (NEURONTIN) 300 MG capsule, Take 300 mg by mouth 3 (three) times daily., Disp: , Rfl:  .  glipiZIDE (GLUCOTROL) 10 MG tablet, Take 1 tablet (10 mg total) by mouth daily before breakfast., Disp: 30 tablet, Rfl: 0 .  insulin NPH-regular Human (NOVOLIN 70/30) (70-30) 100 UNIT/ML injection, Inject 13 Units into the skin 2 (two) times daily with a meal., Disp: 10 mL, Rfl: 11 .  lisinopril (PRINIVIL,ZESTRIL) 20 MG tablet, Take 1 tablet (20 mg total) by mouth daily., Disp: 30 tablet, Rfl: 0 .  meloxicam (MOBIC) 7.5 MG tablet, Take 1 tablet (7.5 mg total) by mouth daily., Disp: 28 tablet, Rfl: 0 .  metFORMIN (GLUCOPHAGE) 1000 MG tablet, Take 1 tablet (1,000 mg total) by mouth 2 (two) times daily with a meal., Disp: 60 tablet, Rfl: 0 .  ondansetron (ZOFRAN ODT) 4 MG disintegrating tablet, Take 1 tablet (4 mg total) by mouth every 6 (six) hours as needed for  nausea., Disp: 20 tablet, Rfl: 0 .  potassium chloride (KLOR-CON) 10 MEQ CR tablet, Take 1 tablet (10 mEq total) by mouth 2 (two) times daily., Disp: 60 tablet, Rfl: 0 .  rosuvastatin (CRESTOR) 40 MG tablet, Take 1 tablet (40 mg total) by mouth daily., Disp: 30 tablet, Rfl: 0 .  traMADol (ULTRAM) 50 MG tablet, Take 1 tablet (50 mg total) by mouth every 6 (six) hours as needed for moderate pain or severe pain., Disp: 10 tablet, Rfl: 0   Social History: Reviewed -  reports that she has been smoking Cigarettes.  She has a 5.5 pack-year smoking history. She has never used smokeless tobacco.  Objective Findings:  Vitals: Blood pressure 136/80, height 5\' 5"  (1.651 m), weight 289 lb (131.09 kg), last menstrual period 09/20/2015.  Physical Examination:  Discussed with pt risks and benefits of laparoscopic hysterectomy and weight  management strategies. At end of discussion, pt had opportunity to ask questions and has no further questions at this time.   Greater than 50% was spent in counseling and coordination of care with the patient. Total time greater than: 20 minutes Assessment & Plan:   A:  1. Endometrial cancer FIGO grade 1  2. Obesity and DM2 poorly controlled.  Importance of change patient focus on diabetic control and weight loss discussed in detail.  2. Pt scheduled to f/u with Dr. Denman George   P:  1. F/U PRN   By signing my name below, I, Terressa Koyanagi, attest that this documentation has been prepared under the direction and in the presence of Mallory Shirk, MD. Electronically Signed: Terressa Koyanagi, ED Scribe. 10/18/2015. 10:16 AM.   I personally performed the services described in this documentation, which was SCRIBED in my presence. The recorded information has been reviewed and considered accurate. It has been edited as necessary during review. Jonnie Kind, MD

## 2015-10-25 ENCOUNTER — Ambulatory Visit (HOSPITAL_BASED_OUTPATIENT_CLINIC_OR_DEPARTMENT_OTHER): Payer: Medicaid Other

## 2015-10-25 ENCOUNTER — Encounter: Payer: Self-pay | Admitting: Gynecologic Oncology

## 2015-10-25 ENCOUNTER — Ambulatory Visit: Payer: Medicaid Other | Attending: Gynecologic Oncology | Admitting: Gynecologic Oncology

## 2015-10-25 VITALS — BP 161/89 | HR 72 | Temp 98.2°F | Resp 20 | Wt 290.9 lb

## 2015-10-25 DIAGNOSIS — F419 Anxiety disorder, unspecified: Secondary | ICD-10-CM | POA: Insufficient documentation

## 2015-10-25 DIAGNOSIS — I10 Essential (primary) hypertension: Secondary | ICD-10-CM | POA: Diagnosis not present

## 2015-10-25 DIAGNOSIS — Z800 Family history of malignant neoplasm of digestive organs: Secondary | ICD-10-CM | POA: Diagnosis not present

## 2015-10-25 DIAGNOSIS — Z6841 Body Mass Index (BMI) 40.0 and over, adult: Secondary | ICD-10-CM | POA: Diagnosis not present

## 2015-10-25 DIAGNOSIS — Z720 Tobacco use: Secondary | ICD-10-CM

## 2015-10-25 DIAGNOSIS — Z794 Long term (current) use of insulin: Secondary | ICD-10-CM | POA: Diagnosis not present

## 2015-10-25 DIAGNOSIS — K589 Irritable bowel syndrome without diarrhea: Secondary | ICD-10-CM | POA: Diagnosis not present

## 2015-10-25 DIAGNOSIS — Z7984 Long term (current) use of oral hypoglycemic drugs: Secondary | ICD-10-CM | POA: Insufficient documentation

## 2015-10-25 DIAGNOSIS — J449 Chronic obstructive pulmonary disease, unspecified: Secondary | ICD-10-CM | POA: Insufficient documentation

## 2015-10-25 DIAGNOSIS — K219 Gastro-esophageal reflux disease without esophagitis: Secondary | ICD-10-CM | POA: Diagnosis not present

## 2015-10-25 DIAGNOSIS — E0840 Diabetes mellitus due to underlying condition with diabetic neuropathy, unspecified: Secondary | ICD-10-CM

## 2015-10-25 DIAGNOSIS — F17210 Nicotine dependence, cigarettes, uncomplicated: Secondary | ICD-10-CM | POA: Diagnosis not present

## 2015-10-25 DIAGNOSIS — Z833 Family history of diabetes mellitus: Secondary | ICD-10-CM | POA: Diagnosis not present

## 2015-10-25 DIAGNOSIS — J45909 Unspecified asthma, uncomplicated: Secondary | ICD-10-CM | POA: Insufficient documentation

## 2015-10-25 DIAGNOSIS — E7800 Pure hypercholesterolemia, unspecified: Secondary | ICD-10-CM | POA: Diagnosis not present

## 2015-10-25 DIAGNOSIS — F329 Major depressive disorder, single episode, unspecified: Secondary | ICD-10-CM | POA: Diagnosis not present

## 2015-10-25 DIAGNOSIS — Z801 Family history of malignant neoplasm of trachea, bronchus and lung: Secondary | ICD-10-CM | POA: Diagnosis not present

## 2015-10-25 DIAGNOSIS — Z8249 Family history of ischemic heart disease and other diseases of the circulatory system: Secondary | ICD-10-CM | POA: Insufficient documentation

## 2015-10-25 DIAGNOSIS — D259 Leiomyoma of uterus, unspecified: Secondary | ICD-10-CM | POA: Diagnosis not present

## 2015-10-25 DIAGNOSIS — Z811 Family history of alcohol abuse and dependence: Secondary | ICD-10-CM | POA: Diagnosis not present

## 2015-10-25 DIAGNOSIS — E119 Type 2 diabetes mellitus without complications: Secondary | ICD-10-CM | POA: Diagnosis not present

## 2015-10-25 DIAGNOSIS — E1140 Type 2 diabetes mellitus with diabetic neuropathy, unspecified: Secondary | ICD-10-CM | POA: Insufficient documentation

## 2015-10-25 DIAGNOSIS — C541 Malignant neoplasm of endometrium: Secondary | ICD-10-CM | POA: Insufficient documentation

## 2015-10-25 NOTE — Progress Notes (Signed)
Consult Note: Gyn-Onc  Consult was requested by Dr. Mallory Shirk for the evaluation of Aviela A Dorian 50 y.o. female with grade 1 endometrial cancer  CC:  Chief Complaint  Patient presents with  . endometrial cancer    New Consultation    Assessment/Plan:  Ms. MARCO MOLOCK  is a 50 y.o.  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 She has a 10cm fibroid uterus, poorly controlled DM with neuropathy (on insulin, metformin, crestor). She is morbidly obese (BMI 48kg/m2). She has a history of tobacco abuse and COPD.  A detailed discussion was held with the patient and her family with regard to to her endometrial cancer diagnosis. We discussed the standard management options for uterine cancer which includes surgery followed possibly by adjuvant therapy depending on the results of surgery. The options for surgical management include a hysterectomy and removal of the tubes and ovaries possibly with SLN biopsy and possible removal of pelvic lymph nodes. A minimally invasive approach including a robotic hysterectomy or laparoscopic hysterectomy have benefits including shorter hospital stay, recovery time and better wound healing. The alternative approach is an open hysterectomy. The patient has been counseled about these surgical options and the risks of surgery in general including infection, bleeding, damage to surrounding structures (including bowel, bladder, ureters, nerves or vessels), and the postoperative risks of PE/ DVT, and lymphedema. I discussed that she is at increased risk for these complications due to her morbid obesity, poorly controlled DM, and tobacco abuse. We will check an HbA1c today, because if significantly elevated, we will delay surgical intervention until optimization has taken place and treat in the interim with progesterone.  I extensively reviewed the additional risks of robotic hysterectomy including possible need for  conversion to open laparotomy.  I discussed positioning during surgery of trendelenberg and risks of minor facial swelling and care we take in preoperative positioning.  After counseling and consideration of her options, she desires to proceed with robotic hysterectomy, BSO, SLN biopsy. Due to her bulky 10cm fibroid uterus, she is at increased risk for failed in tact vaginal specimen delivery and may require minilaparotomy for specimen delivery. I discussed this with the patient.  She will be seen by anesthesia for preoperative clearance and discussion of postoperative pain management.  She was given the opportunity to ask questions, which were answered to her satisfaction, and she is agreement with the above mentioned plan of care.  To expedite her surgical date at the patient's request, I have scheduled her with my partner, Dr Cindie Laroche, at [facility] at [facility].   HPI: Lauriel Orris is a 50 year old G1P1  Who is seen in consultation at the request of Dr. Glo Herring for a grade 1 endometrioid adenocarcinoma of the endometrium. The patient reports a 1 year history of daily vaginal bleeding. It is intermittently heavier and then lighter. If it's a picture of menometorrhagia.  She was seen and evaluated by Dr. Glo Herring on Jerry 30th 2017. He performed a transvaginal ultrasound scan which revealed a uterus measuring 9.9 x 6.1 x 6.6 cm. A contained 2 intramural fibroids one measuring 3.47 m in greatest dimension and a right sided intramural fibroid measuring 2 cm in greatest dimension. The endometrium was thickened at 18 mm. The left and right ovaries were grossly normal. An office endometrial Pipelle biopsy was performed on generally 30th 2017 and revealed grade 1 endometrioid adenocarcinoma. Of note a Pap smear was performed at the same exam and  revealed atypical glandular cells which appear to be of endometrioid origin with negative high-risk HPV.   The patient has  Multiple medical comorbidities. She is poorly  obese with a BMI of 48.4 kg/m. She has type 2 diabetes mellitus for which she takes glipizide, metformin, and insulin. The patient states that is well-controlled. She states that a fasting blood sugar this morning was 108. She does not remember the last time hemoglobin A1c was checked. However in her medical notes she's documented to have poorly controlled diabetes, and she has a diagnosis of a diabetic neuropathy.  The patient has a long-standing history of tobacco abuse but now only smokes 2-3 cigarettes per day. She has asthma and COPD which is treated with inhalers (no home O2, no steroids).   Her prior abdominal surgeries are significant only for a cesarean section with tubal ligation.she does have a past history of pancreatitis in 2014. She is unclear of what the inciting etiology for this was, possibly diabetes. It was treated medically and surgically.  She takes Crestor for hypercholesterolemia.   Current Meds:  Outpatient Encounter Prescriptions as of 10/25/2015  Medication Sig  . albuterol (PROVENTIL) (2.5 MG/3ML) 0.083% nebulizer solution Take 3 mLs (2.5 mg total) by nebulization every 6 (six) hours as needed for wheezing.  [name] esomeprazole (NEXIUM) 40 MG capsule Take 1 capsule (40 mg total) by mouth every morning.  . furosemide (LASIX) 40 MG tablet Take 1 tablet (40 mg total) by mouth daily.  [name] gabapentin (NEURONTIN) 300 MG capsule Take 300 mg by mouth 3 (three) times daily.  [name] glipiZIDE (GLUCOTROL) 10 MG tablet Take 1 tablet (10 mg total) by mouth daily before breakfast.  . insulin NPH-regular Human (NOVOLIN 70/30) (70-30) 100 UNIT/ML injection Inject 13 Units into the skin 2 (two) times daily with a meal. (Patient taking differently: Inject 35 Units into the skin daily with breakfast. )  . lisinopril (PRINIVIL,ZESTRIL) 20 MG tablet Take 1 tablet (20 mg total) by mouth daily.  . meloxicam (MOBIC) 7.5 MG tablet Take 1 tablet (7.5 mg total) by mouth daily.  . metFORMIN (GLUCOPHAGE) 1000  MG tablet Take 1 tablet (1,000 mg total) by mouth 2 (two) times daily with a meal.  . ondansetron (ZOFRAN ODT) 4 MG disintegrating tablet Take 1 tablet (4 mg total) by mouth every 6 (six) hours as needed for nausea.  . potassium chloride (KLOR-CON) 10 MEQ CR tablet Take 1 tablet (10 mEq total) by mouth 2 (two) times daily.  . rosuvastatin (CRESTOR) 40 MG tablet Take 1 tablet (40 mg total) by mouth daily.  . traMADol (ULTRAM) 50 MG tablet Take 1 tablet (50 mg total) by mouth every 6 (six) hours as needed for moderate pain or severe pain.   No facility-administered encounter medications on file as of 10/25/2015.    Allergy: No Known Allergies  Social Hx:   Social History   Social History  . Marital Status: Married    Spouse Name: N/A  . Number of Children: 1  . Years of Education: N/A   Occupational History  . Not on file.   Social History Main Topics  . Smoking status: Current Every Day Smoker -- 0.25 packs/day for 22 years    Types: Cigarettes  . Smokeless tobacco: Never Used  . Alcohol Use: No  . Drug Use: No  . Sexual Activity: Yes    Birth Control/ Protection: None, Surgical   Other Topics Concern  . Not on file   Social History Narrative  Past Surgical Hx:  Past Surgical History  Procedure Laterality Date  . Cesarean section    . Breast reduction surgery    . Tubal ligation    . Esophagogastroduodenoscopy  05/22/2011    Procedure: ESOPHAGOGASTRODUODENOSCOPY (EGD);  Surgeon: Dorothyann Peng, MD;  Location: [facility];  Service: Endoscopy;  Laterality: N/A;  . Pancreatitis      Past Medical Hx:  Past Medical History  Diagnosis Date  . HTN (hypertension)   . GERD (gastroesophageal reflux disease)   . Constipation   . Hypercholesterolemia   . Asthma   . COPD (chronic obstructive pulmonary disease) ([place])   . Shortness of breath   . Headache(784.0)   . Osteoarthritis   . Diabetes mellitus without complication ([place])   . IBS (irritable bowel syndrome)   .  Anxiety   . Depression     Past Gynecological History:  Cesarean section x 1  Patient's last menstrual period was 09/20/2015 (exact date).  Family Hx:  Family History  Problem Relation Age of Onset  . Colon cancer Neg Hx   . Diabetes Mother   . Hypertension Mother   . COPD Mother   . Asthma Mother   . Hypertension Sister   . Hyperlipidemia Sister   . Diabetes Sister   . Asthma Brother   . Alcohol abuse Brother   . Asthma Son   . Cancer Maternal Grandmother     lung, throat  . Heart disease Maternal Grandfather   . Hypertension Sister   . Hyperlipidemia Sister     Review of Systems:  Constitutional  Feels well,    ENT Normal appearing ears and nares bilaterally Skin/Breast  No rash, sores, jaundice, itching, dryness Cardiovascular  No chest pain, shortness of breath, or edema  Pulmonary  No cough or wheeze.  Gastro Intestinal  No nausea, vomitting, or diarrhoea. No bright red blood per rectum, no abdominal pain, change in bowel movement, or constipation.  Genito Urinary  No frequency, urgency, dysuria, + abnormal vaginal bleeding Musculo Skeletal  No myalgia, arthralgia, joint swelling or pain  Neurologic  No weakness, numbness, change in gait,  Psychology  No depression, anxiety, insomnia.   Vitals:  Blood pressure 161/89, pulse 72, temperature 98.2 F (36.8 C), temperature source Oral, resp. rate 20, weight 290 lb 14.4 oz (131.951 kg), last menstrual period 09/20/2015, SpO2 100 %.  Physical Exam: WD in NAD Neck  Supple NROM, without any enlargements.  Lymph Node Survey No cervical supraclavicular or inguinal adenopathy Cardiovascular  Pulse normal rate, regularity and rhythm. S1 and S2 normal.  Lungs  Clear to auscultation bilateraly, without wheezes/crackles/rhonchi. Good air movement.  Skin  No rash/lesions/breakdown  Psychiatry  Alert and oriented to person, place, and time  Abdomen  Normoactive bowel sounds, abdomen soft, non-tender and obese  without evidence of hernia.  Back No CVA tenderness Genito Urinary  Vulva/vagina: Normal external female genitalia.  No lesions. No discharge or bleeding.  Bladder/urethra:  No lesions or masses, well supported bladder  Vagina: grossly normal  Cervix: Normal appearing, no lesions.  Uterus: bulky, 10cm, mobile, no parametrial involvement or nodularity.  Adnexa: no palpable masses. Rectal  Good tone, no masses no cul de sac nodularity.  Extremities  No bilateral cyanosis, clubbing or edema.   Donaciano Eva, MD  10/25/2015, 11:41 AM   CC: Dr Marlou Sa, Dr Clarene Essex, Dr Mallory Shirk

## 2015-10-25 NOTE — Patient Instructions (Addendum)
Plan for surgery on February 23 at [facility] with Dr. Cindie Laroche.  Your pre-operative appointment will be on February 20 at 1:30pm at the [facility].  Please call for any questions or concerns.  We will call you with the results of your Hgb A1C from today.  Hemoglobin A1c Test Some of the sugar (glucose) that circulates in your blood sticks or binds to blood proteins. Hemoglobin (Hb or Hgb) is one type of blood protein that glucose binds to. It also carries oxygen in the red blood cells (RBCs). When glucose binds to Hb, the glucose-coated Hb is called glycated Hb. Once Hb is glycated, it remains that way for the life of the RBC. This is about 120 days. Rather than testing your blood glucose level on one single day, the hemoglobin A1c (HbA1c) test measures the average amount of glycated hemoglobin and, therefore, the average amount of glucose in your blood during the 3-4 months just before the test is done. The HbA1c test is used to monitor long-term control of blood sugar in people who have diabetes mellitus. The HbA1c test can also be used in addition to or in combination with fasting blood glucose level and oral glucose tolerance tests. RESULTS It is your responsibility to obtain your test results. Ask the lab or department performing the test when and how you will get your results. Contact your health care provider to discuss any questions you have about your results. Range of Normal Values Ranges for normal values may vary among different labs and hospitals. You should always check with your health care provider after having lab work or other tests done to discuss the meaning of your test results and whether your values are considered within normal limits. The ranges for normal HbA1c test results are as follows:  Adult or child without diabetes: 4-5.9%.  Adult or child with diabetes and good blood glucose control: less than 6.5%. Several factors can affect HbA1c test results. These may  include:  Diseases (hemoglobinopathies) that cause a change in the shape, size, or amount of Hb in your blood.  Longer than normal RBC life span.  Abnormally low levels of certain proteins in your blood.  Eating foods or taking supplements that are high in vitamin C (ascorbic acid). Meaning of Results Outside Normal Value Ranges Abnormally high HbA1c values are most commonly an indication of prediabetes mellitus and diabetes mellitus:  An HbA1c result of 5.7-6.4% is considered diagnostic of prediabetes mellitus.  An HbA1c result of 6.5% or higher on two separate occasions is considered diagnostic of diabetes mellitus. Abnormally low HbA1c values can be caused by several health conditions. These may include:  Pregnancy.  A large amount of blood loss.  Blood transfusions.  Low red blood cell count (anemia). This is caused by premature destruction of red blood cells.  Long-term kidney failure.  Some unusual forms of Hb (Hb variants), such as sickle cell trait. Discuss your test results with your health care provider. He or she will use the results to make a diagnosis and determine a treatment plan that is right for you.   This information is not intended to replace advice given to you by your health care provider. Make sure you discuss any questions you have with your health care provider.   Document Released: 09/19/2004 Document Revised: 09/18/2014 Document Reviewed: 01/12/2014 Elsevier Interactive Patient Education [employer].

## 2015-10-26 ENCOUNTER — Telehealth: Payer: Self-pay

## 2015-10-26 LAB — HEMOGLOBIN A1C
Est. average glucose Bld gHb Est-mCnc: 123 mg/dL
Hemoglobin A1c: 5.9 % — ABNORMAL HIGH (ref 4.8–5.6)

## 2015-10-26 NOTE — Telephone Encounter (Signed)
Orders received from [place] to contact the patient to update with results of her A1C being 5.9 , improvement noted from 2 years ago with an A1C of 7.5 . Patient contcated and notified of results , patient states understanding , denies further questions at this time .

## 2015-11-04 DIAGNOSIS — Z90710 Acquired absence of both cervix and uterus: Secondary | ICD-10-CM | POA: Insufficient documentation

## 2015-11-04 HISTORY — DX: Acquired absence of both cervix and uterus: Z90.710

## 2015-11-05 ENCOUNTER — Other Ambulatory Visit: Payer: Self-pay | Admitting: Obstetrics and Gynecology

## 2015-12-27 DIAGNOSIS — Z029 Encounter for administrative examinations, unspecified: Secondary | ICD-10-CM

## 2016-01-13 ENCOUNTER — Other Ambulatory Visit (HOSPITAL_COMMUNITY): Payer: Self-pay | Admitting: Internal Medicine

## 2016-01-13 DIAGNOSIS — Z1231 Encounter for screening mammogram for malignant neoplasm of breast: Secondary | ICD-10-CM

## 2016-01-14 ENCOUNTER — Other Ambulatory Visit (HOSPITAL_COMMUNITY)
Admission: RE | Admit: 2016-01-14 | Discharge: 2016-01-14 | Disposition: A | Discharge status: Home / Self Care | Payer: Medicaid Other | Source: Ambulatory Visit | Attending: Internal Medicine | Admitting: Internal Medicine

## 2016-01-14 DIAGNOSIS — K5900 Constipation, unspecified: Secondary | ICD-10-CM | POA: Insufficient documentation

## 2016-01-14 LAB — AMMONIA

## 2016-01-31 ENCOUNTER — Ambulatory Visit (HOSPITAL_COMMUNITY): Payer: Medicaid Other

## 2016-02-02 ENCOUNTER — Ambulatory Visit (HOSPITAL_COMMUNITY): Payer: Medicaid Other

## 2016-02-02 ENCOUNTER — Other Ambulatory Visit (HOSPITAL_COMMUNITY): Payer: Self-pay | Admitting: Internal Medicine

## 2016-02-02 ENCOUNTER — Ambulatory Visit (HOSPITAL_COMMUNITY)
Admission: RE | Admit: 2016-02-02 | Discharge: 2016-02-02 | Disposition: A | Discharge status: Home / Self Care | Payer: Medicaid Other | Source: Ambulatory Visit | Attending: Internal Medicine | Admitting: Internal Medicine

## 2016-02-02 DIAGNOSIS — Z1231 Encounter for screening mammogram for malignant neoplasm of breast: Secondary | ICD-10-CM | POA: Diagnosis present

## 2016-06-06 ENCOUNTER — Telehealth: Payer: Self-pay

## 2016-06-06 NOTE — Telephone Encounter (Signed)
I spoke to patient and rescheduled appt for 10/9, Provider out sick

## 2016-06-07 ENCOUNTER — Institutional Professional Consult (permissible substitution): Payer: Medicaid Other | Admitting: Neurology

## 2016-06-19 ENCOUNTER — Ambulatory Visit (INDEPENDENT_AMBULATORY_CARE_PROVIDER_SITE_OTHER): Payer: Medicaid Other | Admitting: Neurology

## 2016-06-19 ENCOUNTER — Encounter: Payer: Self-pay | Admitting: Neurology

## 2016-06-19 VITALS — BP 112/62 | HR 82 | Resp 20 | Ht 65.0 in | Wt 294.0 lb

## 2016-06-19 DIAGNOSIS — R400 Somnolence: Secondary | ICD-10-CM

## 2016-06-19 DIAGNOSIS — R519 Headache, unspecified: Secondary | ICD-10-CM

## 2016-06-19 DIAGNOSIS — R0681 Apnea, not elsewhere classified: Secondary | ICD-10-CM | POA: Diagnosis not present

## 2016-06-19 DIAGNOSIS — R51 Headache: Secondary | ICD-10-CM

## 2016-06-19 DIAGNOSIS — R351 Nocturia: Secondary | ICD-10-CM | POA: Diagnosis not present

## 2016-06-19 DIAGNOSIS — R0683 Snoring: Secondary | ICD-10-CM

## 2016-06-19 NOTE — Patient Instructions (Signed)

## 2016-06-19 NOTE — Progress Notes (Signed)
Subjective:    Patient ID: Sonya James is a 50 y.o. female.  HPI     Star Age, MD, PhD [facility] [address] [place], [place] [address]  Dear Dr. Marlou Sa,   I saw your patient, Sonya James, upon your kind request in my neurologic clinic today for initial consultation of her sleep disorder, in particular, concern for underlying obstructive sleep apnea. The patient is unaccompanied today. As you know, Sonya James is a 50 year old right-handed woman with an underlying medical history of diabetes, hyperlipidemia, hypertension, asthma, COPD, smoking, vitamin D deficiency, IBS, recent diagnosis of endometrial cancer, s/p surgery, depression, anxiety, and morbid obesity, who reports snoring and excessive daytime somnolence. I reviewed your office note from 03/13/2016 and 01/13/2016, which kindly included. She also reports occasional morning headaches. Her Epworth sleepiness score is high at 23 out of 24 today, the fatigue score is high at 55 out of 63. She has been referred to bariatric surgery. She reports tingling in the bottom of her feet, had foot surgery on the L and was noted by the podiatrist to have apneas. Per husband, she makes gasping noises. She has woken up with a sense of panic, palpitations and gasping for air.  She has nocturia twice per night. She endorses RLS symptoms and leg movements at night.  She lives at her mom's house with her husband and her brother stays there too. There is stress in the household. She has one grown son.   She does not work. She used to work in a factory. She smokes about 2-3 cigarettes per day currently. She does not drink alcohol. She drinks quite a bit of caffeine in the form of [facility], 2-3 bottles per day. Bedtime is around 10 PM and wakeup time is around 7 AM.  Her Past Medical History Is Significant For: Past Medical History:  Diagnosis Date  . Anxiety   . Asthma   . Autonomic neuropathy   .  Constipation   . COPD (chronic obstructive pulmonary disease) ([place])   . CTS (carpal tunnel syndrome)   . Depression   . Diabetes mellitus without complication ([place])   . Endometrial cancer ([place]) 2017  . GERD (gastroesophageal reflux disease)   . Headache(784.0)   . HTN (hypertension)   . Hypercholesterolemia   . IBS (irritable bowel syndrome)   . Osteoarthritis   . Shortness of breath   . Uterine cancer ([place]) 2017  . Vitamin D deficiency     Her Past Surgical History Is Significant For: Past Surgical History:  Procedure Laterality Date  . BREAST REDUCTION SURGERY    . CESAREAN SECTION    . ESOPHAGOGASTRODUODENOSCOPY  05/22/2011   Procedure: ESOPHAGOGASTRODUODENOSCOPY (EGD);  Surgeon: Dorothyann Peng, MD;  Location: [facility];  Service: Endoscopy;  Laterality: N/A;  . pancreatitis    . TUBAL LIGATION      Her Family History Is Significant For: Family History  Problem Relation Age of Onset  . Diabetes Mother   . Hypertension Mother   . COPD Mother   . Asthma Mother   . Hypertension Sister   . Hyperlipidemia Sister   . Diabetes Sister   . Asthma Brother   . Alcohol abuse Brother   . Asthma Son   . Cancer Maternal Grandmother     lung, throat  . Heart disease Maternal Grandfather   . Hypertension Sister   . Hyperlipidemia Sister   . Colon cancer Neg Hx  Her Social History Is Significant For: Social History   Social History  . Marital status: Married    Spouse name: N/A  . Number of children: 1  . Years of education: N/A   Social History Main Topics  . Smoking status: Current Every Day Smoker    Packs/day: 0.25    Years: 22.00    Types: Cigarettes  . Smokeless tobacco: Never Used  . Alcohol use No  . Drug use: No  . Sexual activity: Yes    Birth control/ protection: None, Surgical   Other Topics Concern  . None   Social History Narrative   Drinks coffee occasionally, soda 2-3 daily     Her Allergies Are:  No Known Allergies:   Her Current  Medications Are:  Outpatient Encounter Prescriptions as of 06/19/2016  Medication Sig  . acetaminophen (TYLENOL) 325 MG tablet Take 650 mg by mouth.  [name] albuterol (PROVENTIL) (2.5 MG/3ML) 0.083% nebulizer solution Take 3 mLs (2.5 mg total) by nebulization every 6 (six) hours as needed for wheezing.  . Carboxymethylcellulose Sodium 1 % GEL 1 drop Three (3) times a day.  . esomeprazole (NEXIUM) 40 MG capsule Take 1 capsule (40 mg total) by mouth every morning.  . furosemide (LASIX) 40 MG tablet Take 1 tablet (40 mg total) by mouth daily.  [name] gabapentin (NEURONTIN) 300 MG capsule Take 300 mg by mouth 3 (three) times daily.  [name] glipiZIDE (GLUCOTROL) 10 MG tablet Take 1 tablet (10 mg total) by mouth daily before breakfast.  . ibuprofen (ADVIL,MOTRIN) 600 MG tablet Take 600 mg by mouth.  . insulin NPH-regular Human (NOVOLIN 70/30) (70-30) 100 UNIT/ML injection Inject 13 Units into the skin 2 (two) times daily with a meal. (Patient taking differently: Inject 35 Units into the skin daily with breakfast. )  . lisinopril (PRINIVIL,ZESTRIL) 20 MG tablet Take 1 tablet (20 mg total) by mouth daily.  . meloxicam (MOBIC) 7.5 MG tablet Take 1 tablet (7.5 mg total) by mouth daily.  . metFORMIN (GLUCOPHAGE) 1000 MG tablet Take 1 tablet (1,000 mg total) by mouth 2 (two) times daily with a meal.  . montelukast (SINGULAIR) 10 MG tablet Take 10 mg by mouth.  . ondansetron (ZOFRAN ODT) 4 MG disintegrating tablet Take 4 mg by mouth.  . potassium chloride (K-DUR) 10 MEQ tablet Take by mouth.  . rosuvastatin (CRESTOR) 40 MG tablet Take 1 tablet (40 mg total) by mouth daily.  . traMADol (ULTRAM) 50 MG tablet Take 1 tablet (50 mg total) by mouth every 6 (six) hours as needed for moderate pain or severe pain.   No facility-administered encounter medications on file as of 06/19/2016.   :  Review of Systems:  Out of a complete 14 point review of systems, all are reviewed and negative with the exception of these symptoms as  listed below:  Review of Systems  Neurological:       Patient has never had a sleep study. Some trouble falling asleep, has trouble staying asleep, snoring, has trouble breathing when laying on back, wakes up feeling tired, morning headaches, takes naps, daytime fatigue.    Epworth Sleepiness Scale 0= would never doze 1= slight chance of dozing 2= moderate chance of dozing 3= high chance of dozing  Sitting and reading:3 Watching TV:3 Sitting inactive in a public place (ex. Theater or meeting):3 As a passenger in a car for an hour without a break:3 Lying down to rest in the afternoon:3 Sitting and talking to someone:3 Sitting quietly after lunch (  no alcohol):3 In a car, while stopped in traffic:2 Total:23  Objective:  Neurologic Exam  Physical Exam Physical Examination:   Vitals:   06/19/16 1422  BP: 112/62  Pulse: 82  Resp: 20   General Examination: The patient is a very pleasant 50 y.o. female in no acute distress. She appears well-developed and well-nourished and well groomed. She is morbidly obese.  HEENT: Normocephalic, atraumatic, pupils are equal, round and reactive to light and accommodation. Funduscopic exam is normal with sharp disc margins noted. Extraocular tracking is good without limitation to gaze excursion or nystagmus noted. Normal smooth pursuit is noted. Hearing is grossly intact. Face is symmetric with normal facial animation and normal facial sensation. Speech is clear with no dysarthria noted. There is no hypophonia. There is no lip, neck/head, jaw or voice tremor. Neck is supple with full range of passive and active motion. There are no carotid bruits on auscultation. Oropharynx exam reveals: mild mouth dryness, adequate dental hygiene and moderate airway crowding, due to larger tongue and redundant soft palate. Mallampati is class III. Tongue protrudes centrally and palate elevates symmetrically. Tonsils are not visualized. She has a sensitive gag reflex.  Neck size is 17 1/8 inches. She has a Mild overbite.   Chest: Clear to auscultation without wheezing, rhonchi or crackles noted.  Heart: S1+S2+0, regular and normal without murmurs, rubs or gallops noted.   Abdomen: Soft, non-tender and non-distended with normal bowel sounds appreciated on auscultation.  Extremities: There is trace pitting edema in the distal lower extremities bilaterally, L more than R.   Skin: Warm and dry without trophic changes noted. There are no varicose veins.  Musculoskeletal: exam reveals no obvious joint deformities, tenderness or joint swelling or erythema. L foot pain.   Neurologically:  Mental status: The patient is awake, alert and oriented in all 4 spheres. Her immediate and remote memory, attention, language skills and fund of knowledge are appropriate. There is no evidence of aphasia, agnosia, apraxia or anomia. Speech is clear with normal prosody and enunciation. Thought process is linear. Mood is normal and affect is blunted.  Cranial nerves II - XII are as described above under HEENT exam. In addition: shoulder shrug is normal with equal shoulder height noted. Motor exam: Normal bulk, strength and tone is noted. There is no drift, tremor or rebound. Romberg is negative. Reflexes are 2+ in the UEs and trace in the knees and absent in the ankles. Babinski: Toes are flexor bilaterally. Fine motor skills and coordination: intact with normal finger taps, normal hand movements, normal rapid alternating patting, normal foot taps and normal foot agility.  Cerebellar testing: No dysmetria or intention tremor on finger to nose testing. Heel to shin is unremarkable bilaterally. There is no truncal or gait ataxia.  Sensory exam: intact to light touch, pinprick, vibration, temperature sense in the upper and lower extremities, with mild decrease in sensation in the feet.  Gait, station and balance: She stands with difficulty. No veering to one side is noted. No leaning to  one side is noted. Posture is age-appropriate and stance is wide based. Gait shows slow and cautious gait, wide based. Has a single point cane, slight limp on the L.   Assessment and Plan:  In summary, LYNDY POWLES is a very pleasant 50 y.o.-year old female with an underlying medical history of diabetes, hyperlipidemia, hypertension, asthma, COPD, smoking, vitamin D deficiency, IBS, recent diagnosis of endometrial cancer, s/p surgery, depression, anxiety, and morbid obesity, whose history and physical exam  are in keeping with obstructive sleep apnea (OSA). She also endorses RLS symptoms and leg twitching at night.  I had a long chat with the patient about my findings and the diagnosis of OSA, its prognosis and treatment options. We talked about medical treatments, surgical interventions and non-pharmacological approaches. I explained in particular the risks and ramifications of untreated moderate to severe OSA, especially with respect to developing cardiovascular disease down the Road, including congestive heart failure, difficult to treat hypertension, cardiac arrhythmias, or stroke. Even type 2 diabetes has, in part, been linked to untreated OSA. Symptoms of untreated OSA include daytime sleepiness, memory problems, mood irritability and mood disorder such as depression and anxiety, lack of energy, as well as recurrent headaches, especially morning headaches. We talked about smoking cessation and trying to maintain a  healthy lifestyle in general, as well as the importance of weight control. I encouraged the patient to eat healthy, exercise daily and keep well hydrated, to keep a scheduled bedtime and wake time routine, to not skip any meals and eat healthy snacks in between meals. I advised the patient not to drive when feeling sleepy. Of note, patient does not currently have a car. She is reminded to reduce her caffeine intake and increase her water intake. I recommended the following at this time: sleep  study with potential positive airway pressure titration. (We will score hypopneas at 4% and split the sleep study into diagnostic and treatment portion, if the estimated. 2 hour AHI is >15/h).   I explained the sleep test procedure to the patient and also outlined possible surgical and non-surgical treatment options of OSA, including the use of a custom-made dental device (which would require a referral to a specialist dentist or oral surgeon), upper airway surgical options, such as pillar implants, radiofrequency surgery, tongue base surgery, and UPPP (which would involve a referral to an ENT surgeon). Rarely, jaw surgery such as mandibular advancement may be considered.  I also explained the CPAP treatment option to the patient, who indicated that she would be willing to try CPAP if the need arises. I explained the importance of being compliant with PAP treatment, not only for insurance purposes but primarily to improve Her symptoms, and for the patient's long term health benefit, including to reduce Her cardiovascular risks. I answered all her questions today and the patient was in agreement. I would like to see her back after the sleep study is completed and encouraged her to call with any interim questions, concerns, problems or updates.   Thank you very much for allowing me to participate in the care of this nice patient. If I can be of any further assistance to you please do not hesitate to call me at [phone].  Sincerely,   Star Age, MD, PhD

## 2016-07-11 ENCOUNTER — Ambulatory Visit (INDEPENDENT_AMBULATORY_CARE_PROVIDER_SITE_OTHER): Payer: Medicaid Other | Admitting: Neurology

## 2016-07-11 DIAGNOSIS — G4733 Obstructive sleep apnea (adult) (pediatric): Secondary | ICD-10-CM | POA: Diagnosis not present

## 2016-07-11 DIAGNOSIS — G4761 Periodic limb movement disorder: Secondary | ICD-10-CM

## 2016-07-11 DIAGNOSIS — G4720 Circadian rhythm sleep disorder, unspecified type: Secondary | ICD-10-CM

## 2016-07-12 ENCOUNTER — Encounter: Payer: Self-pay | Admitting: *Deleted

## 2016-07-18 ENCOUNTER — Telehealth: Payer: Self-pay | Admitting: Neurology

## 2016-07-18 DIAGNOSIS — G4733 Obstructive sleep apnea (adult) (pediatric): Secondary | ICD-10-CM

## 2016-07-18 DIAGNOSIS — G4761 Periodic limb movement disorder: Secondary | ICD-10-CM

## 2016-07-18 NOTE — Telephone Encounter (Signed)
Patient referred by Dr. Marlou Sa, seen by me on 06/19/16, diagnostic PSG on 07/11/16.   Please call and notify the patient that the recent sleep study did confirm the diagnosis of obstructive sleep apnea, which was severe in REM sleep with oxygen desaturation during REM sleep as low as 77%. I recommend treatment for this in the form of CPAP. This will require a repeat sleep study for proper titration and mask fitting. Please explain to patient and arrange for a CPAP titration study. I have placed an order in the chart. Thanks, and please route to [facility] for scheduling next sleep study.  Star Age, MD, PhD [facility])

## 2016-07-18 NOTE — Telephone Encounter (Signed)
Patient called to request sleep study results. Patient advised results could take up to 10-14 days from the date of her sleep study to get the results.

## 2016-07-18 NOTE — Progress Notes (Signed)
PATIENT'S NAME:  Sonya James, Sonya James DOB:      [birth year]      MR#:    [id number]     DATE OF RECORDING: 07/11/2016 REFERRING M.D.:  Kevan Ny, MD Study Performed:   Baseline Polysomnogram HISTORY:  50 year old right-handed woman with an underlying medical history of diabetes, hyperlipidemia, hypertension, asthma, COPD, smoking, vitamin D deficiency, IBS, recent diagnosis of endometrial cancer, s/p surgery, depression, anxiety, and morbid obesity, who reports snoring and excessive daytime somnolence. The patient endorsed the Epworth Sleepiness Scale at 23/23 points. The patient's weight 294 pounds with a height of 65 (inches), resulting in a BMI of 48.9 kg/m2. The patient's neck circumference measured 17.1 inches.  CURRENT MEDICATIONS: Acetaminophen, Albuterol, Carboxymethylcellulose, Esomeprazole, Furosemide, Gabapentin, Glipizide, Ibuprofen, Insulin, Lisinopril, Meloxicam, Metformin, Montelukast, Ondansetron, Potassium Chloride, Rosuvastatin and Tramadol   PROCEDURE:  This is a multichannel digital polysomnogram utilizing the Somnostar 11.2 system.  Electrodes and sensors were applied and monitored per AASM Specifications.   EEG, EOG, Chin and Limb EMG, were sampled at 200 Hz.  ECG, Snore and Nasal Pressure, Thermal Airflow, Respiratory Effort, CPAP Flow and Pressure, Oximetry was sampled at 50 Hz. Digital video and audio were recorded.      BASELINE STUDY  Lights Out was at 20:50 and Lights On at 05:03.  Total recording time (TRT) was 493.5 minutes, with a total sleep time (TST) of  406.5 minutes.   The patient's sleep latency was 21.5 minutes, which is near normal.  REM latency was 156 minutes, which is mildly prolonged.  The sleep efficiency was 82.4 %.     SLEEP ARCHITECTURE: WASO (Wake after sleep onset) was 62 minutes with mild to moderate sleep fragmentation noted.  There were 21.5 minutes in Stage N1, 324 minutes Stage N2, 0 minutes Stage N3 and 61 minutes in Stage REM.  The percentage of Stage N1  was 5.3%, Stage N2 was 79.7%, which is increased, Stage N3 was absent, and Stage R (REM sleep) was 15.%, which is mildly reduced.   Audio and video analysis did not show any abnormal or unusual movements, behaviors, phonations or vocalizations.   The patient took 2 bathroom breaks. Mild to moderate snoring was noted. EKG was in keeping with normal sinus rhythm (NSR).  RESPIRATORY ANALYSIS:  There were a total of 62 respiratory events:  18 obstructive apneas, 0 central apneas and 1 mixed apneas with a total of 19 apneas and an apnea index (AI) of 2.8 /hour. There were 43 hypopneas with a hypopnea index of 6.3 /hour. The patient also had 0 respiratory event related arousals (RERAs).      The total APNEA/HYPOPNEA INDEX (AHI) was 9.2/hour and the total RESPIRATORY DISTURBANCE INDEX was 9.2 /hour.  48 events occurred in REM sleep and 23 events in NREM. The REM AHI was 47.2 /hour, versus a non-REM AHI of 2.4. The patient spent 193.5 minutes of total sleep time in the supine position and 213 minutes in non-supine.. The supine AHI was 9.0 versus a non-supine AHI of 9.3.  OXYGEN SATURATION & C02:  The Wake baseline 02 saturation was 96%, with the lowest being 77%. Time spent below 89% saturation equaled 26 minutes.   PERIODIC LIMB MOVEMENTS:   The patient had a total of 227 Periodic Limb Movements.  The Periodic Limb Movement (PLM) index was 33.5/hour, which is moderately increased, and the PLM Arousal index was .7/hour, which is not increased.  Post-study, the patient did not indicate, how sleep compared to her usual.  IMPRESSION:  1. Obstructive Sleep Apnea(OSA) 2. Periodic Limb Movement Disorder (PLMD) 3. Dysfunctions associated with sleep stages or arousal from sleep  RECOMMENDATIONS:  1. This overnight polysomnogram demonstrates overall mild obstructive sleep apnea with a total AHI of 9/2/hour. However, the REM sleep related OSA is severe with a REM AHI of 47.2/hour and there were  desaturations into the 80s and O2 nadir of 77% during REM sleep. In light of her medical history and sleep related complaints, I recommend treatment in the form of CPAP. This will requires a full night CPAP titration study for proper treatment settings and mask fitting. Other treatment options for OSA may include: avoidance of the supine sleep position, weight loss, an oral appliance (aka dental device, custom made by a specialized dentist usually), or upper airway or jaw surgery (not usually first line treatments). 2. Please note that untreated obstructive sleep apnea carries additional perioperative morbidity. Patients with significant obstructive sleep apnea should receive perioperative PAP therapy and the surgeons and particularly the anesthesiologist should be informed of the diagnosis and the severity of the sleep disordered breathing. 3. The patient should be cautioned not to drive, work at heights, or operate dangerous or heavy equipment when tired or sleepy. Review and reiteration of good sleep hygiene measures should be pursued with any patient. 4. Moderate PLMs (periodic limb movements of sleep) were noted during the study without significant arousals; clinical correlation is recommended. 5. This study shows sleep fragmentation and abnormal sleep stage percentages; these are nonspecific findings and per se do not signify an intrinsic sleep disorder or a cause for the patient's sleep-related symptoms. Causes include (but are not limited to) the first night effect of the sleep study, circadian rhythm disturbances, medication effect or an underlying mood disorder or medical problem.  6. The patient will be seen in follow-up by Dr. Rexene Alberts at [facility] for discussion of the test results and further management strategies. The referring provider will be notified of the test results.    I certify that I have reviewed the entire raw data recording prior to the issuance of this report in accordance with the  Standards of Accreditation of the American Academy of Sleep Medicine (AASM)       Star Age, MD, PhD Diplomat, American Board of Psychiatry and Neurology  Diplomat, [place] [employer]

## 2016-07-19 NOTE — Telephone Encounter (Signed)
I called patient, female answered and stated that patient is not there. I said that I will try back later.

## 2016-07-19 NOTE — Telephone Encounter (Signed)
Patient called back. I gave her results and recommendations. She is willing to proceed with titration study. I have sent a copy to PCP.

## 2016-07-25 ENCOUNTER — Ambulatory Visit (INDEPENDENT_AMBULATORY_CARE_PROVIDER_SITE_OTHER): Payer: Medicaid Other | Admitting: Neurology

## 2016-07-25 DIAGNOSIS — G4720 Circadian rhythm sleep disorder, unspecified type: Secondary | ICD-10-CM

## 2016-07-25 DIAGNOSIS — G4733 Obstructive sleep apnea (adult) (pediatric): Secondary | ICD-10-CM | POA: Diagnosis not present

## 2016-07-31 ENCOUNTER — Telehealth: Payer: Self-pay | Admitting: Neurology

## 2016-07-31 NOTE — Telephone Encounter (Signed)
Patient calling to get sleep results.

## 2016-07-31 NOTE — Telephone Encounter (Signed)
I called, no answer and no vm. I do not have results yet but as soon as I do, I will call back.

## 2016-08-09 ENCOUNTER — Telehealth: Payer: Self-pay

## 2016-08-09 NOTE — Addendum Note (Signed)
Addended by: Star Age on: 08/09/2016 07:56 AM   Modules accepted: Orders

## 2016-08-09 NOTE — Progress Notes (Signed)
Pt had PSG on 07/11/16 and CPAP study on 07/25/16. Please note, that pressure should be 9 cm, not 11 cm, as erroneously typed in report (did not know how to fix). Please call and inform patient that I have entered an order for treatment with positive airway pressure (PAP) treatment of obstructive sleep apnea (OSA). She did well during the latest sleep study with CPAP. We will, therefore, arrange for a machine for home use through a DME (durable medical equipment) company of Her choice; and I will see the patient back in follow-up in about 8-10 weeks. Please also explain to the patient that I will be looking out for compliance data, which can be downloaded from the machine (stored on an SD card, that is inserted in the machine) or via remote access through a modem, that is built into the machine. At the time of the followup appointment we will discuss sleep study results and how it is going with PAP treatment at home. Please advise patient to bring Her machine at the time of the first FU visit, even though this is cumbersome. Bringing the machine for every visit after that will likely not be needed, but often helps for the first visit to troubleshoot if needed. Please re-enforce the importance of compliance with treatment and the need for [place] to monitor compliance data - often an insurance requirement and actually good feedback for the patient as far as how they are doing.  Also remind patient, that any interim PAP machine or mask issues should be first addressed with the DME company, as they can often help better with technical and mask fit issues. Please ask if patient has a preference regarding DME company.  Please also make sure, the patient has a follow-up appointment with me in about 8-10 weeks from the setup date, thanks.  Once you have spoken to the patient - and faxed/routed report to PCP and referring MD (if other than PCP), you can close this encounter, thanks,   Star Age, MD, PhD [facility]

## 2016-08-09 NOTE — Telephone Encounter (Signed)
-----   Message from Star Age, MD sent at 08/09/2016  7:56 AM EST ----- Pt had PSG on 07/11/16 and CPAP study on 07/25/16. Please note, that pressure should be 9 cm, not 11 cm, as erroneously typed in report (did not know how to fix). Please call and inform patient that I have entered an order for treatment with positive airway pressure (PAP) treatment of obstructive sleep apnea (OSA). She did well during the latest sleep study with CPAP. We will, therefore, arrange for a machine for home use through a DME (durable medical equipment) company of Her choice; and I will see the patient back in follow-up in about 8-10 weeks. Please also explain to the patient that I will be looking out for compliance data, which can be downloaded from the machine (stored on an SD card, that is inserted in the machine) or via remote access through a modem, that is built into the machine. At the time of the followup appointment we will discuss sleep study results and how it is going with PAP treatment at home. Please advise patient to bring Her machine at the time of the first FU visit, even though this is cumbersome. Bringing the machine for every visit after that will likely not be needed, but often helps for the first visit to troubleshoot if needed. Please re-enforce the importance of compliance with treatment and the need for [place] to monitor compliance data - often an insurance requirement and actually good feedback for the patient as far as how they are doing.  Also remind patient, that any interim PAP machine or mask issues should be first addressed with the DME company, as they can often help better with technical and mask fit issues. Please ask if patient has a preference regarding DME company.  Please also make sure, the patient has a follow-up appointment with me in about 8-10 weeks from the setup date, thanks.  Once you have spoken to the patient - and faxed/routed report to PCP and referring MD (if other than PCP), you  can close this encounter, thanks,   Star Age, MD, PhD [facility] ([place])

## 2016-08-09 NOTE — Procedures (Signed)
PATIENT'S NAME:  Rami, Farnum DOB:      06/03/1966      MR#:    [id number]     DATE OF RECORDING: 07/25/2016 REFERRING M.D.:  Kevan Ny, MD Study Performed:   CPAP  Titration HISTORY: 50 year old woman with a history of diabetes, hyperlipidemia, hypertension, asthma, COPD, smoking, vitamin D deficiency, IBS, recent diagnosis of endometrial cancer, s/p surgery, depression, anxiety, and morbid obesity, who had baseline PSG on 07/11/16, which demonstrated a total AHI of 9.2/hour. However, the REM sleep related OSA was severe with a REM AHI of 47.2/hour and there were desaturations into the 80s and O2 nadir of 77% during REM sleep. She presents for a full night CPAP titration. The patient endorsed the Epworth Sleepiness Scale at 23-/23. The patient's weight 293 pounds with a height of 65 (inches), resulting in a BMI of 48.9 kg/m2. The patient's neck circumference measured 17.1 inches.  CURRENT MEDICATIONS: Acetaminophen, Albuterol, Carboxymethylcellulose, Esomeprazole, Furosemide, Gabapentin, Glipizide, Ibuprofen, Insulin, Lisinopril, Meloxicam, Metformin, Montelukast, Ondansetron, Potassium Chloride, Rosuvastatin and Tramadol   PROCEDURE:  This is a multichannel digital polysomnogram utilizing the SomnoStar 11.2 system.  Electrodes and sensors were applied and monitored per AASM Specifications.   EEG, EOG, Chin and Limb EMG, were sampled at 200 Hz.  ECG, Snore and Nasal Pressure, Thermal Airflow, Respiratory Effort, CPAP Flow and Pressure, Oximetry was sampled at 50 Hz. Digital video and audio were recorded.      CPAP was initiated at 5 cmH20 with heated humidity per AASM split night standards and pressure was advanced to 9 cmH20 because of hypopneas, apneas and desaturations. At a PAP pressure of 9 cmH20, there was a reduction of the AHI to 0/hour with non-supine REM sleep achieved and O2 nadir of 90%.  Lights Out was at 22:50 and Lights On at 05:13. Total recording time (TRT) was 384 minutes, with a total  sleep time (TST) of 333.5 minutes. The patient's sleep latency was 7.5 minutes with 0 minutes of wake time after sleep onset. REM latency was 334 minutes, which is markedly prolonged.  The sleep efficiency was 86.8%.    SLEEP ARCHITECTURE: WASO (Wake after sleep onset)  was 43 minutes with moderate sleep fragmentation noted.  There were 24 minutes in Stage N1, 280 minutes Stage N2, 0 minutes Stage N3 and 29.5 minutes in Stage REM.  The percentage of Stage N1 was 7.2%, Stage N2 was 84%, Stage N3 was absent and Stage R (REM sleep) was 8.8%.   RESPIRATORY ANALYSIS:  There was a total of 6 respiratory events: 0 obstructive apneas, 0 central apneas and 0 mixed apneas with a total of 0 apneas and an apnea index (AI) of 0 /hour. There were 6 hypopneas with a hypopnea index of 1.1/hour. The patient also had 0 respiratory event related arousals (RERAs).      The total APNEA/HYPOPNEA INDEX  (AHI) was 1.1 /hour and the total RESPIRATORY DISTURBANCE INDEX was 1.1 .hour  0 events occurred in REM sleep and 6 events in NREM. The REM AHI was 0 /hour versus a non-REM AHI of 1.2 /hour.  The patient spent 126.5 minutes of total sleep time in the supine position and 207 minutes in non-supine. The supine AHI was 0.9, versus a non-supine AHI of 1.2.  OXYGEN SATURATION & C02:  The baseline 02 saturation was 95%, with the lowest being 88%. Time spent below 89% saturation equaled 0 minutes.  PERIODIC LIMB MOVEMENTS:    The patient had a total of 23 Periodic Limb  Movements. The Periodic Limb Movement (PLM) index was 4.1 and the PLM Arousal index was .2 /hour. The Technologist noted the patient was fitted with a Resmed AirFit P10 (Med) apparatus.  DIAGNOSIS 1. Obstructive Sleep Apnea  2. Dysfunctions associated with sleep stages or arousal from sleep   PLANS/RECOMMENDATIONS: 1. This study demonstrates resolution of the patient's obstructive sleep apnea with CPAP therapy. I will, therefore, start the patient on home CPAP  treatment at a pressure of 11 cm via medium nasal pillows with heated humidity. The patient should be reminded to be fully compliant with PAP therapy to improve sleep related symptoms and decrease long term cardiovascular risks. The patient should be reminded, that it may take up to 3 months to get fully used to using PAP with all planned sleep. The earlier full compliance is achieved, the better long term compliance tends to be. Please note that untreated obstructive sleep apnea carries additional perioperative morbidity. Patients with significant obstructive sleep apnea should receive perioperative PAP therapy and the surgeons and particularly the anesthesiologist should be informed of the diagnosis and the severity of the sleep disordered breathing. 2. The patient should be cautioned not to drive, work at heights, or operate dangerous or heavy equipment when tired or sleepy. Review and reiteration of good sleep hygiene measures should be pursued with any patient. 3. Other OSA treatment options may include avoidance of supine sleep position along with weight loss, upper airway or jaw surgery in selected patients or the use of an oral appliance in certain patients. ENT evaluation and/or consultation with a maxillofacial surgeon or dentist may be feasible in some instances.  4. This study shows sleep fragmentation and abnormal sleep stage percentages; these are nonspecific findings and per se do not signify an intrinsic sleep disorder or a cause for the patient's sleep-related symptoms. Causes include (but are not limited to) the first night effect of the sleep study, circadian rhythm disturbances, medication effect or an underlying mood disorder or medical problem.  5. The patient will be seen in follow-up by Dr. Rexene Alberts at [facility] for discussion of the test results and further management strategies. The referring provider will be notified of the test results.    I certify that I have reviewed the entire raw data  recording prior to the issuance of this report in accordance with the Standards of Accreditation of the American Academy of Sleep Medicine (AASM)    Star Age, MD, PhD Diplomat, American Board of Psychiatry and Neurology (Neurology and Sleep)

## 2016-08-09 NOTE — Progress Notes (Signed)
CPAP pressure recommendation should be 9 cm in report, not 11 cm, did not know how to fix report after signing.

## 2016-08-09 NOTE — Telephone Encounter (Signed)
I spoke to patient and she is aware of results and recommendations. She is willing to start treatment. I have sent orders to [facility] in [place]. I will send report to PCP. Patient will receive a letter reminding her to make f/u appt and stress the importance of compliance.

## 2016-09-11 HISTORY — PX: ABDOMINAL HYSTERECTOMY: SHX81

## 2016-10-26 ENCOUNTER — Ambulatory Visit (INDEPENDENT_AMBULATORY_CARE_PROVIDER_SITE_OTHER): Payer: Medicaid Other | Admitting: Neurology

## 2016-10-26 ENCOUNTER — Encounter: Payer: Self-pay | Admitting: Neurology

## 2016-10-26 VITALS — BP 151/79 | HR 64 | Resp 20 | Ht 65.0 in | Wt 290.0 lb

## 2016-10-26 DIAGNOSIS — Z9989 Dependence on other enabling machines and devices: Secondary | ICD-10-CM

## 2016-10-26 DIAGNOSIS — G4733 Obstructive sleep apnea (adult) (pediatric): Secondary | ICD-10-CM

## 2016-10-26 NOTE — Progress Notes (Signed)
Subjective:    Patient ID: Sonya James is a 51 y.o. female.  HPI     Interim history:   Sonya James is a 51 year old right-handed woman with an underlying medical history of diabetes, hyperlipidemia, hypertension, asthma, COPD, smoking, vitamin D deficiency, IBS, recent diagnosis of endometrial cancer, s/p surgery, depression, anxiety, and morbid obesity, who presents for follow-up consultation of her sleep apnea, after her recent sleep study testing. The patient is unaccompanied today. I first met her on 06/19/2016 at the request of her primary care provider, at which time the patient reported snoring and excessive daytime somnolence. I invited her for sleep study. She had a baseline sleep study, followed by a CPAP titration study. I went over her test results with her in detail today. Her baseline sleep study from 07/11/2016 showed a sleep efficiency of 82.4% with a sleep latency of 21.5 minutes, REM latency mildly prolonged at 156 minutes, wake after sleep onset was 62 minutes with mild to moderate sleep fragmentation noted. She had absence of slow-wave sleep and REM sleep was mildly reduced at 15%, stage II increase at 79.7%. She had to restroom breaks. She had mild to moderate snoring, EKG was in keeping with normal sinus rhythm. Total AHI was 9.2 per hour, REM AHI was 47.2 per hour, supine AHI was 9 per hour. Average oxygen saturation was 96%, nadir was 77%, time below 89% saturation was 26 minutes for the night. She had moderate PLMS with an index of 33.5 per hour, without significant arousals. Based on her test results and her sleep-related complaints I invited her for a full night CPAP titration study. She had this on 07/25/2016. Sleep efficiency was 6.8%, sleep latency 7.5 minutes and REM latency was prolonged at 334 minutes. She had a reduced amount of REM sleep at 8.8%, stage III was absent, light stage sleep with stage II was highly elevated at 84%. She had no significant PLMS. She was fitted  with a medium nasal pillows interface. CPAP was titrated from 5 cm to 9 cm. AHI was 0 per hour on the final pressure with an O2 nadir of 90%, nonsupine REM sleep achieved. Average oxygen saturation was 95%, nadir was 88% for the night. Based on her test results I prescribed CPAP therapy for home use.  Today, 10/26/2016 (all dictated new, as well as above notes, some dictation done in note pad or Word, outside of chart, may appear as copied): I reviewed her CPAP compliance data from 09/24/2016 through 10/23/2016, which is a total of 30 days, during which time she used her CPAP 27 days with percent used days greater than 4 hours at 47% only, indicating suboptimal compliance with an average usage of 4 hours and 6 minutes, residual AHI low at 1 per hour, leak acceptable with the 95th percentile at 15.3 L/m on a pressure of 9 cm with EPR of 3. She reports doing much better with CPAP therapy. She feels that her sleep is better quality, her daytime energy is improved. Her husband has noted that she snores very little. She does get up to use the bathroom and sometimes does not keep the mask on all night every night. She has had intermittent problems with nasal congestion and cold symptoms and was not able to use her CPAP, trying to lose weight, has signed up at the [facility]. She is motivated to continue to use her CPAP and quite pleased with how she is doing. She did not do well with a nasal pillows and  requested to use a fullface mask. Sometimes the mask dislodges and she has night sweats which makes the seal difficult.   Previously (copied from previous notes for reference):   06/19/2016: She reports snoring and excessive daytime somnolence. I reviewed your office note from 03/13/2016 and 01/13/2016, which kindly included. She also reports occasional morning headaches. Her Epworth sleepiness score is high at 23 out of 24 today, the fatigue score is high at 55 out of 63. She has been referred to bariatric surgery. She  reports tingling in the bottom of her feet, had foot surgery on the L and was noted by the podiatrist to have apneas. Per husband, she makes gasping noises. She has woken up with a sense of panic, palpitations and gasping for air.  She has nocturia twice per night. She endorses RLS symptoms and leg movements at night.  She lives at her mom's house with her husband and her brother stays there too. There is stress in the household. She has one grown son.   She does not work. She used to work in a factory. She smokes about 2-3 cigarettes per day currently. She does not drink alcohol. She drinks quite a bit of caffeine in the form of [facility], 2-3 bottles per day. Bedtime is around 10 PM and wakeup time is around 7 AM.   The patient's allergies, current medications, family history, past medical history, past social history, past surgical history and problem list were reviewed and updated as appropriate.    Her Past Medical History Is Significant For: Past Medical History:  Diagnosis Date  . Anxiety   . Asthma   . Autonomic neuropathy   . Constipation   . COPD (chronic obstructive pulmonary disease) (HCC)   . CTS (carpal tunnel syndrome)   . Depression   . Diabetes mellitus without complication (HCC)   . Endometrial cancer (HCC) 2017  . GERD (gastroesophageal reflux disease)   . Headache(784.0)   . HTN (hypertension)   . Hypercholesterolemia   . IBS (irritable bowel syndrome)   . Osteoarthritis   . Shortness of breath   . Uterine cancer (HCC) 2017  . Vitamin D deficiency     Her Past Surgical History Is Significant For: Past Surgical History:  Procedure Laterality Date  . BREAST REDUCTION SURGERY    . CESAREAN SECTION    . ESOPHAGOGASTRODUODENOSCOPY  05/22/2011   Procedure: ESOPHAGOGASTRODUODENOSCOPY (EGD);  Surgeon: Arlyce Harman, MD;  Location: [facility];  Service: Endoscopy;  Laterality: N/A;  . pancreatitis    . TUBAL LIGATION      Her Family History Is Significant  For: Family History  Problem Relation Age of Onset  . Diabetes Mother   . Hypertension Mother   . COPD Mother   . Asthma Mother   . Hypertension Sister   . Hyperlipidemia Sister   . Diabetes Sister   . Asthma Brother   . Alcohol abuse Brother   . Asthma Son   . Cancer Maternal Grandmother     lung, throat  . Heart disease Maternal Grandfather   . Hypertension Sister   . Hyperlipidemia Sister   . Colon cancer Neg Hx     Her Social History Is Significant For: Social History   Social History  . Marital status: Married    Spouse name: N/A  . Number of children: 1  . Years of education: N/A   Social History Main Topics  . Smoking status: Current Every Day Smoker    Packs/day:  0.25    Years: 22.00    Types: Cigarettes  . Smokeless tobacco: Never Used  . Alcohol use No  . Drug use: No  . Sexual activity: Yes    Birth control/ protection: None, Surgical   Other Topics Concern  . None   Social History Narrative   Drinks coffee occasionally, soda 2-3 daily     Her Allergies Are:  No Known Allergies:   Her Current Medications Are:  Outpatient Encounter Prescriptions as of 10/26/2016  Medication Sig  . acetaminophen (TYLENOL) 325 MG tablet Take 650 mg by mouth.  [name] albuterol (PROVENTIL) (2.5 MG/3ML) 0.083% nebulizer solution Take 3 mLs (2.5 mg total) by nebulization every 6 (six) hours as needed for wheezing.  . Carboxymethylcellulose Sodium 1 % GEL 1 drop Three (3) times a day.  . esomeprazole (NEXIUM) 40 MG capsule Take 1 capsule (40 mg total) by mouth every morning.  . furosemide (LASIX) 40 MG tablet Take 1 tablet (40 mg total) by mouth daily.  [name] gabapentin (NEURONTIN) 300 MG capsule Take 300 mg by mouth 3 (three) times daily.  [name] glipiZIDE (GLUCOTROL) 10 MG tablet Take 1 tablet (10 mg total) by mouth daily before breakfast.  . insulin NPH-regular Human (NOVOLIN 70/30) (70-30) 100 UNIT/ML injection Inject 13 Units into the skin 2 (two) times daily with a meal.  (Patient taking differently: Inject 35 Units into the skin daily with breakfast. )  . lisinopril (PRINIVIL,ZESTRIL) 20 MG tablet Take 1 tablet (20 mg total) by mouth daily.  . metFORMIN (GLUCOPHAGE) 1000 MG tablet Take 1 tablet (1,000 mg total) by mouth 2 (two) times daily with a meal.  . montelukast (SINGULAIR) 10 MG tablet Take 10 mg by mouth.  . ondansetron (ZOFRAN ODT) 4 MG disintegrating tablet Take 4 mg by mouth.  . potassium chloride (K-DUR) 10 MEQ tablet Take by mouth.  . rosuvastatin (CRESTOR) 40 MG tablet Take 1 tablet (40 mg total) by mouth daily.  . traMADol (ULTRAM) 50 MG tablet Take 1 tablet (50 mg total) by mouth every 6 (six) hours as needed for moderate pain or severe pain.  . [DISCONTINUED] ibuprofen (ADVIL,MOTRIN) 600 MG tablet Take 600 mg by mouth.  . [DISCONTINUED] meloxicam (MOBIC) 7.5 MG tablet Take 1 tablet (7.5 mg total) by mouth daily.   No facility-administered encounter medications on file as of 10/26/2016.   :  Review of Systems:  Out of a complete 14 point review of systems, all are reviewed and negative with the exception of these symptoms as listed below:  Review of Systems  Neurological:       Pt presents today to discuss her cpap. Pt reports that she does feel better while using cpap.    Objective:  Neurologic Exam  Physical Exam Physical Examination:   Vitals:   10/26/16 1414  BP: (!) 151/79  Pulse: 64  Resp: 20    General Examination: The patient is a very pleasant 51 y.o. female in no acute distress. She appears well-developed and well-nourished and well groomed. She has lost about 4 pounds since October 2017.  HEENT: Normocephalic, atraumatic, pupils are equal, round and reactive to light and accommodation. Extraocular tracking is good without limitation to gaze excursion or nystagmus noted. Normal smooth pursuit is noted. Hearing is grossly intact. Face is symmetric with normal facial animation and normal facial sensation. Speech is clear  with no dysarthria noted. There is no hypophonia. There is no lip, neck/head, jaw or voice tremor. Neck is supple with full range  of passive and active motion. There are no carotid bruits on auscultation. Oropharynx exam reveals: mild mouth dryness, adequate dental hygiene and moderate airway crowding. Mallampati is class III. Tongue protrudes centrally and palate elevates symmetrically.  Chest: Clear to auscultation without wheezing, rhonchi or crackles noted.  Heart: S1+S2+0, regular and normal without murmurs, rubs or gallops noted.   Abdomen: Soft, non-tender and non-distended with normal bowel sounds appreciated on auscultation.  Extremities: There is trace pitting edema in the distal lower extremities bilaterally.  Skin: Warm and dry without trophic changes noted.   Musculoskeletal: exam reveals no obvious joint deformities, tenderness or joint swelling or erythema, except reports bilateral knee pain.   Neurologically:  Mental status: The patient is awake, alert and oriented in all 4 spheres. Her immediate and remote memory, attention, language skills and fund of knowledge are appropriate. There is no evidence of aphasia, agnosia, apraxia or anomia. Speech is clear with normal prosody and enunciation. Thought process is linear. Mood is normal and affect is normal.  Cranial nerves II - XII are as described above under HEENT exam. In addition: shoulder shrug is normal with equal shoulder height noted. Motor exam: Normal bulk, strength and tone is noted. There is no drift, tremor or rebound. Romberg is negative. Reflexes are 1+ in the upper extremities, trace in both knees and trace in both ankles. Fine motor skills and coordination: intact in the UEs and LEs.  Cerebellar testing: No dysmetria or intention tremor on finger to nose testing. Heel to shin is unremarkable bilaterally. There is no truncal or gait ataxia.  Sensory exam: intact to light touch in the upper and lower extremities.   Gait, station and balance: She stands with difficulty.  does not have her cane today. She reports bilateral knee pain, she walks slowly, slightly wide-based. Tandem walk is not possible.                Assessment and Plan:  In summary, Sonya James is a very pleasant 51 y.o.-year old female  with an underlying medical history of diabetes, irritable bowel syndrome, hypertension, hyperlipidemia, asthma, COPD, smoking, vitamin D deficiency, endometrial cancer with status post surgery, history of depression and anxiety as well as morbid obesity who returns for follow-up consultation of her obstructive sleep apnea after her sleep study testing. She had a baseline sleep study on 07/11/2016, followed by a CPAP titration study on 07/25/2016. She has mild obstructive sleep apnea overall with significant exacerbation during REM sleep, she has severe REM related OSA and an O2 nadir of 77% during her baseline sleep study. She did well with CPAP therapy. She had PLMS during her first study but not during her second study. She feels better rested, she is trying to be compliant with treatment and is advised to use her CPAP every night all night, particularly in the early morning hours or later part of the night as she has REM related OSA and REM sleep tends to be more in the second part of the night. She feels better rested. She is commended for her weight loss endeavors and for her CPAP usage. I would like to suggest a one-month checkup briefly with one of her nurse practitioners to make sure her compliance numbers are good. I can see her back infrequently after that, even once a year. We discussed both sleep study findings today as well as her compliance data. Exam is stable. She is advised to seek evaluation of her bilateral knee pain. I answered all  her questions today and she was in agreement.  I spent 25 minutes in total face-to-face time with the patient, more than 50% of which was spent in counseling and  coordination of care, reviewing test results, reviewing medication and discussing or reviewing the diagnosis of OSA, its prognosis and treatment options. Pertinent laboratory and imaging test results that were available during this visit with the patient were reviewed by me and considered in my medical decision making (see chart for details).

## 2016-10-26 NOTE — Patient Instructions (Addendum)
Please continue using your CPAP regularly. While your insurance requires that you use CPAP at least 4 hours each night on 70% of the nights, I recommend, that you not skip any nights and use it throughout the night if you can. Getting used to CPAP and staying with the treatment long term does take time and patience and discipline. Untreated obstructive sleep apnea when it is moderate to severe can have an adverse impact on cardiovascular health and raise her risk for heart disease, arrhythmias, hypertension, congestive heart failure, stroke and diabetes. Untreated obstructive sleep apnea causes sleep disruption, nonrestorative sleep, and sleep deprivation. This can have an impact on your day to day functioning and cause daytime sleepiness and impairment of cognitive function, memory loss, mood disturbance, and problems focussing. Using CPAP regularly can improve these symptoms.  Please use your CPAP all night every night. We can see you in 1 mo for a brief recheck, you can see one of our nurse practitioners and I will see you after that.

## 2016-11-20 ENCOUNTER — Encounter (HOSPITAL_COMMUNITY): Payer: Self-pay | Admitting: Cardiology

## 2016-11-20 ENCOUNTER — Emergency Department (HOSPITAL_COMMUNITY)
Admission: EM | Admit: 2016-11-20 | Discharge: 2016-11-20 | Disposition: A | Discharge status: Home / Self Care | Payer: Medicaid Other | Attending: Emergency Medicine | Admitting: Emergency Medicine

## 2016-11-20 DIAGNOSIS — Z794 Long term (current) use of insulin: Secondary | ICD-10-CM | POA: Diagnosis not present

## 2016-11-20 DIAGNOSIS — E119 Type 2 diabetes mellitus without complications: Secondary | ICD-10-CM | POA: Insufficient documentation

## 2016-11-20 DIAGNOSIS — J441 Chronic obstructive pulmonary disease with (acute) exacerbation: Secondary | ICD-10-CM | POA: Diagnosis not present

## 2016-11-20 DIAGNOSIS — F17210 Nicotine dependence, cigarettes, uncomplicated: Secondary | ICD-10-CM | POA: Insufficient documentation

## 2016-11-20 DIAGNOSIS — Z8541 Personal history of malignant neoplasm of cervix uteri: Secondary | ICD-10-CM | POA: Diagnosis not present

## 2016-11-20 DIAGNOSIS — R109 Unspecified abdominal pain: Secondary | ICD-10-CM | POA: Diagnosis present

## 2016-11-20 DIAGNOSIS — K219 Gastro-esophageal reflux disease without esophagitis: Secondary | ICD-10-CM | POA: Insufficient documentation

## 2016-11-20 DIAGNOSIS — I10 Essential (primary) hypertension: Secondary | ICD-10-CM | POA: Insufficient documentation

## 2016-11-20 DIAGNOSIS — J45909 Unspecified asthma, uncomplicated: Secondary | ICD-10-CM | POA: Insufficient documentation

## 2016-11-20 DIAGNOSIS — Z79899 Other long term (current) drug therapy: Secondary | ICD-10-CM | POA: Diagnosis not present

## 2016-11-20 LAB — BASIC METABOLIC PANEL
Anion gap: 7 (ref 5–15)
BUN: 9 mg/dL (ref 6–20)
CALCIUM: 10.1 mg/dL (ref 8.9–10.3)
CO2: 29 mmol/L (ref 22–32)
Chloride: 98 mmol/L — ABNORMAL LOW (ref 101–111)
Creatinine, Ser: 0.85 mg/dL (ref 0.44–1.00)
GFR calc Af Amer: >60 mL/min (ref >60)
GLUCOSE: 385 mg/dL — AB (ref 65–99)
Potassium: 3.9 mmol/L (ref 3.5–5.1)
SODIUM: 134 mmol/L — AB (ref 135–145)

## 2016-11-20 LAB — TROPONIN I

## 2016-11-20 MED ORDER — ESOMEPRAZOLE MAGNESIUM 40 MG PO CPDR: 40.0000 mg | DELAYED_RELEASE_CAPSULE | ORAL | 0 refills | Status: DC

## 2016-11-20 NOTE — ED Triage Notes (Signed)
States 2 weeks ago she had bad reflux and has had throat burning since then.  Also c/o not feeling like she can swallow.

## 2016-11-20 NOTE — Discharge Instructions (Signed)
Sonya James,  I have printed a prescription for your nexium so that you can have it filled wherever you would like to. GERD has very serious side effects if left untreated. Please schedule an appointment to see a gastroenterologist as soon as possible. Seek medical attention if your pain does not improve with restarting the nexium.

## 2016-11-20 NOTE — ED Provider Notes (Signed)
[facility] DEPT Provider Note   CSN: [id number] Arrival date & time: 11/20/16  0908     History   Chief Complaint No chief complaint on file.   HPI Sonya James is a 51 y.o. female.  HPI  PMH GERD, COPD, DM, diastolic dysfunction and IBS presents with sore throat and heartburn. She has longstanding GERD which has been untreated for over 1 month. She says that at last visit with her primary care physician last Monday she was told that there would be a prescription at [facility] but there was not. The pain is worse in the morning and limits her swallowing. She also describes a metallic taste. She denies primary blood per rectum or melena.   Past Medical History:  Diagnosis Date  . Anxiety   . Asthma   . Autonomic neuropathy   . Constipation   . COPD (chronic obstructive pulmonary disease) ([place])   . CTS (carpal tunnel syndrome)   . Depression   . Diabetes mellitus without complication ([place])   . Endometrial cancer ([place]) 2017  . GERD (gastroesophageal reflux disease)   . Headache(784.0)   . HTN (hypertension)   . Hypercholesterolemia   . IBS (irritable bowel syndrome)   . Osteoarthritis   . Shortness of breath   . Uterine cancer ([place]) 2017  . Vitamin D deficiency     Patient Active Problem List   Diagnosis Date Noted  . Endometrial cancer ([place]) 10/25/2015  . Diabetes mellitus with neuropathy ([place]) 10/25/2015  . Cancer of endometrium ([place]) 10/18/2015  . Menometrorrhagia 10/11/2015  . COPD exacerbation ([place]) 07/04/2014  . Type II or unspecified type diabetes mellitus without mention of complication, not stated as uncontrolled 05/27/2013  . Acute bronchitis 05/25/2013  . Fever 05/25/2013  . Asthma 05/25/2013  . COPD (chronic obstructive pulmonary disease) ([place]) 05/25/2013  . Tobacco abuse 05/25/2013  . Obesity 05/25/2013  . HTN (hypertension), benign 05/25/2013  . Hypercholesteremia 05/25/2013  . Pedal edema 05/25/2013  . Thyromegaly 05/25/2013  . Abdominal pain  04/11/2011  . Constipation 04/11/2011  . GERD (gastroesophageal reflux disease) 04/11/2011    Past Surgical History:  Procedure Laterality Date  . BREAST REDUCTION SURGERY    . CESAREAN SECTION    . ESOPHAGOGASTRODUODENOSCOPY  05/22/2011   Procedure: ESOPHAGOGASTRODUODENOSCOPY (EGD);  Surgeon: Dorothyann Peng, MD;  Location[facility];  Service: Endoscopy;  Laterality: N/A;  . pancreatitis    . TUBAL LIGATION      OB History    No data available       Home Medications    Prior to Admission medications   Medication Sig Start Date End Date Taking? Authorizing Provider  acetaminophen (TYLENOL) 325 MG tablet Take 650 mg by mouth. 11/05/15   Historical Provider, MD  albuterol (PROVENTIL) (2.5 MG/3ML) 0.083% nebulizer solution Take 3 mLs (2.5 mg total) by nebulization every 6 (six) hours as needed for wheezing. 07/05/14   Kathie Dike, MD  Carboxymethylcellulose Sodium 1 % GEL 1 drop Three (3) times a day.    Historical Provider, MD  esomeprazole (NEXIUM) 40 MG capsule Take 1 capsule (40 mg total) by mouth every morning. 07/05/14   Kathie Dike, MD  furosemide (LASIX) 40 MG tablet Take 1 tablet (40 mg total) by mouth daily. 05/27/13   Kathie Dike, MD  gabapentin (NEURONTIN) 300 MG capsule Take 300 mg by mouth 3 (three) times daily.    Historical Provider, MD  glipiZIDE (GLUCOTROL) 10 MG tablet Take 1 tablet (10 mg total) by mouth  daily before breakfast. 07/05/14   Kathie Dike, MD  insulin NPH-regular Human (NOVOLIN 70/30) (70-30) 100 UNIT/ML injection Inject 13 Units into the skin 2 (two) times daily with a meal. Patient taking differently: Inject 35 Units into the skin daily with breakfast.  12/23/14   Virgel Manifold, MD  lisinopril (PRINIVIL,ZESTRIL) 20 MG tablet Take 1 tablet (20 mg total) by mouth daily. 07/05/14   Kathie Dike, MD  metFORMIN (GLUCOPHAGE) 1000 MG tablet Take 1 tablet (1,000 mg total) by mouth 2 (two) times daily with a meal. 07/05/14   Kathie Dike, MD    montelukast (SINGULAIR) 10 MG tablet Take 10 mg by mouth.    Historical Provider, MD  ondansetron (ZOFRAN ODT) 4 MG disintegrating tablet Take 4 mg by mouth. 10/11/15   Historical Provider, MD  potassium chloride (K-DUR) 10 MEQ tablet Take by mouth. 07/05/14   Historical Provider, MD  rosuvastatin (CRESTOR) 40 MG tablet Take 1 tablet (40 mg total) by mouth daily. 07/05/14   Kathie Dike, MD  traMADol (ULTRAM) 50 MG tablet Take 1 tablet (50 mg total) by mouth every 6 (six) hours as needed for moderate pain or severe pain. 10/11/15   Jonnie Kind, MD    Family History Family History  Problem Relation Age of Onset  . Diabetes Mother   . Hypertension Mother   . COPD Mother   . Asthma Mother   . Hypertension Sister   . Hyperlipidemia Sister   . Diabetes Sister   . Asthma Brother   . Alcohol abuse Brother   . Asthma Son   . Cancer Maternal Grandmother     lung, throat  . Heart disease Maternal Grandfather   . Hypertension Sister   . Hyperlipidemia Sister   . Colon cancer Neg Hx     Social History Social History  Substance Use Topics  . Smoking status: Current Every Day Smoker    Packs/day: 0.25    Years: 22.00    Types: Cigarettes  . Smokeless tobacco: Never Used  . Alcohol use No     Allergies   Patient has no known allergies.   Review of Systems Review of Systems  Constitutional: Negative for appetite change.  HENT: Positive for sore throat and trouble swallowing.   Respiratory: Negative for shortness of breath.   Cardiovascular: Negative for chest pain.  Gastrointestinal: Positive for abdominal pain. Negative for blood in stool, constipation and diarrhea.  All other systems reviewed and are negative.    Physical Exam Updated Vital Signs BP 125/77 (BP Location: Left Arm)   Pulse 65   Temp 97.8 F (36.6 C) (Oral)   Resp 16   Ht 5\' 5"  (1.651 m)   Wt 132.5 kg   LMP 09/20/2015 (Exact Date)   SpO2 96%   BMI 48.59 kg/m   Physical Exam  Constitutional:  She is oriented to person, place, and time. She appears well-developed and well-nourished. No distress.  Non septic appearing female, appears younger than stated age   HENT:  Head: Normocephalic and atraumatic.  Mouth/Throat: No oropharyngeal exudate.  Eyes: Conjunctivae are normal. No scleral icterus.  Neck: Normal range of motion. Neck supple. No thyromegaly present.  Cardiovascular: Normal rate and regular rhythm.   No murmur heard. Pulmonary/Chest: Effort normal. No respiratory distress.  Abdominal: Soft. Bowel sounds are normal. She exhibits no distension. There is tenderness. There is no guarding.  Epigastric tenderness  Lymphadenopathy:    She has no cervical adenopathy.  Neurological: She is alert and oriented  to person, place, and time.  Skin: Skin is warm and dry. She is not diaphoretic.  Psychiatric: She has a normal mood and affect. Her behavior is normal.     ED Treatments / Results  Labs (all labs ordered are listed, but only abnormal results are displayed) Labs Reviewed  BASIC METABOLIC PANEL  TROPONIN I    EKG  EKG Interpretation None       Radiology No results found.  Procedures Procedures (including critical care time)  Medications Ordered in ED Medications - No data to display   Initial Impression / Assessment and Plan / ED Course  I have reviewed the triage vital signs and the nursing notes.  Pertinent labs & imaging results that were available during my care of the patient were reviewed by me and considered in my medical decision making (see chart for details).     51 yo woman with PMH GERD, COPD, DM, diastolic dysfunction and IBS presents with sore throat and heartburn. Symptoms and exam are consistent with untreated GERD.  Initial EKG was concerning for U waves, CMP revealed normal potassium and troponin was negative.  I am not able to review her PCP notes but can see a prescription for nexium 40 mg and will reorder this dose. I have provided  her with a printed prescription for nexium and recommended GI follow up as soon as possible. Discussed return precautions.   Final Clinical Impressions(s) / ED Diagnoses   Final diagnoses:  None    New Prescriptions New Prescriptions   No medications on file     Ledell Noss, MD 11/20/16 1305    Elnora Morrison, MD 11/20/16 [phone]

## 2016-11-21 ENCOUNTER — Encounter: Payer: Self-pay | Admitting: Gastroenterology

## 2016-12-11 ENCOUNTER — Encounter: Payer: Self-pay | Admitting: Adult Health

## 2016-12-12 ENCOUNTER — Encounter: Payer: Self-pay | Admitting: Adult Health

## 2016-12-12 ENCOUNTER — Ambulatory Visit (INDEPENDENT_AMBULATORY_CARE_PROVIDER_SITE_OTHER): Payer: Medicaid Other | Admitting: Adult Health

## 2016-12-12 VITALS — BP 166/80 | HR 75 | Resp 20 | Ht 65.0 in | Wt 282.0 lb

## 2016-12-12 DIAGNOSIS — Z9989 Dependence on other enabling machines and devices: Secondary | ICD-10-CM | POA: Diagnosis not present

## 2016-12-12 DIAGNOSIS — G4733 Obstructive sleep apnea (adult) (pediatric): Secondary | ICD-10-CM

## 2016-12-12 NOTE — Progress Notes (Signed)
PATIENT: Sonya James DOB: [birth year]  REASON FOR VISIT: follow up- OSA on CPAP HISTORY FROM: patient  HISTORY OF PRESENT ILLNESS: Sonya James is a 51 year old female with a history of obstructive sleep apnea on CPAP. She returns today for follow-up. Her CPAP download indicates that she uses her machine 28 out of 30 days for a compliance of 93%. She uses her machine greater than 4 hours 23 out of 30 days for compliance of 77%. On average she uses her machine for 5 hours and 35 minutes. Her residual AHI is 0.9 on 9 cm water with EPR 3. She feels that the CPAP does work well for her. She denies any significant daytime sleepiness. She states that she has gotten better by using it consistently. She denies any new neurological symptoms. She returns today for an evaluation.  HISTORY copied for Dr. Guadelupe Sabin notes: Sonya James is a 51 year old right-handed woman with an underlying medical history of diabetes, hyperlipidemia, hypertension, asthma, COPD, smoking, vitamin D deficiency, IBS, recent diagnosis of endometrial cancer, s/p surgery, depression, anxiety, and morbid obesity, who presents for follow-up consultation of her sleep apnea, after her recent sleep study testing. The patient is unaccompanied today. I first met her on 06/19/2016 at the request of her primary care provider, at which time the patient reported snoring and excessive daytime somnolence. I invited her for sleep study. She had a baseline sleep study, followed by a CPAP titration study. I went over her test results with her in detail today. Her baseline sleep study from 07/11/2016 showed a sleep efficiency of 82.4% with a sleep latency of 21.5 minutes, REM latency mildly prolonged at 156 minutes, wake after sleep onset was 62 minutes with mild to moderate sleep fragmentation noted. She had absence of slow-wave sleep and REM sleep was mildly reduced at 15%, stage II increase at 79.7%. She had to restroom breaks. She had mild to moderate  snoring, EKG was in keeping with normal sinus rhythm. Total AHI was 9.2 per hour, REM AHI was 47.2 per hour, supine AHI was 9 per hour. Average oxygen saturation was 96%, nadir was 77%, time below 89% saturation was 26 minutes for the night. She had moderate PLMS with an index of 33.5 per hour, without significant arousals. Based on her test results and her sleep-related complaints I invited her for a full night CPAP titration study. She had this on 07/25/2016. Sleep efficiency was 6.8%, sleep latency 7.5 minutes and REM latency was prolonged at 334 minutes. She had a reduced amount of REM sleep at 8.8%, stage III was absent, light stage sleep with stage II was highly elevated at 84%. She had no significant PLMS. She was fitted with a medium nasal pillows interface. CPAP was titrated from 5 cm to 9 cm. AHI was 0 per hour on the final pressure with an O2 nadir of 90%, nonsupine REM sleep achieved. Average oxygen saturation was 95%, nadir was 88% for the night. Based on her test results I prescribed CPAP therapy for home use.  ,10/26/2016  reviewed her CPAP compliance data from 09/24/2016 through 10/23/2016, which is a total of 30 days, during which time she used her CPAP 27 days with percent used days greater than 4 hours at 47% only, indicating suboptimal compliance with an average usage of 4 hours and 6 minutes, residual AHI low at 1 per hour, leak acceptable with the 95th percentile at 15.3 L/m on a pressure of 9 cm with EPR of 3. She reports doing  much better with CPAP therapy. She feels that her sleep is better quality, her daytime energy is improved. Her husband has noted that she snores very little. She does get up to use the bathroom and sometimes does not keep the mask on all night every night. She has had intermittent problems with nasal congestion and cold symptoms and was not able to use her CPAP, trying to lose weight, has signed up at the [facility]. She is motivated to continue to use her CPAP and quite  pleased with how she is doing. She did not do well with a nasal pillows and requested to use a fullface mask. Sometimes the mask dislodges and she has night sweats which makes the seal difficult.   Previously (copied from previous notes for reference):   06/19/2016: She reports snoring and excessive daytime somnolence. I reviewed your office note from 03/13/2016 and 01/13/2016, which kindly included. She also reports occasional morning headaches. Her Epworth sleepiness score is high at 23 out of 24 today, the fatigue score is high at 55 out of 63. She has been referred to bariatric surgery. She reports tingling in the bottom of her feet, had foot surgery on the L and was noted by the podiatrist to have apneas. Per husband, she makes gasping noises. She has woken up with a sense of panic, palpitations and gasping for air.  She has nocturia twice per night. She endorses RLS symptoms and leg movements at night.  She lives at her mom's house with her husband and her brother stays there too. There is stress in the household. She has one grown son.  She does not work. She used to work in a factory. She smokes about 2-3 cigarettes per day currently. She does not drink alcohol. She drinks quite a bit of caffeine in the form of [facility], 2-3 bottles per day. Bedtime is around 10 PM and wakeup time is around 7 AM.  The patient's allergies, current medications, family history, past medical history, past social history, past surgical history and problem list were reviewed and updated as appropriate.     REVIEW OF SYSTEMS: Out of a complete 14 system review of symptoms, the patient complains only of the following symptoms, and all other reviewed systems are negative.  Wheezing, shortness of breath, choking, restless leg, apnea, frequent waking, joint pain, joint swelling, back pain, walking difficulty, neck stiffness, moles, itching, nervous/anxious, depression, hallucinations, self injury, weakness,  numbness, headache, excessive thirst  ALLERGIES: No Known Allergies  HOME MEDICATIONS: Outpatient Medications Prior to Visit  Medication Sig Dispense Refill  . acetaminophen (TYLENOL) 325 MG tablet Take 650 mg by mouth.    [name] albuterol (PROVENTIL) (2.5 MG/3ML) 0.083% nebulizer solution Take 3 mLs (2.5 mg total) by nebulization every 6 (six) hours as needed for wheezing. 75 mL 12  . Carboxymethylcellulose Sodium 1 % GEL 1 drop Three (3) times a day.    . esomeprazole (NEXIUM) 40 MG capsule Take 1 capsule (40 mg total) by mouth every morning. 30 capsule 0  . furosemide (LASIX) 40 MG tablet Take 1 tablet (40 mg total) by mouth daily. 30 tablet 0  . gabapentin (NEURONTIN) 300 MG capsule Take 300 mg by mouth 3 (three) times daily.    [name] glipiZIDE (GLUCOTROL) 10 MG tablet Take 1 tablet (10 mg total) by mouth daily before breakfast. 30 tablet 0  . insulin NPH-regular Human (NOVOLIN 70/30) (70-30) 100 UNIT/ML injection Inject 13 Units into the skin 2 (two) times daily with a meal. (  Patient taking differently: Inject 35 Units into the skin daily with breakfast. ) 10 mL 11  . lisinopril (PRINIVIL,ZESTRIL) 20 MG tablet Take 1 tablet (20 mg total) by mouth daily. 30 tablet 0  . metFORMIN (GLUCOPHAGE) 1000 MG tablet Take 1 tablet (1,000 mg total) by mouth 2 (two) times daily with a meal. 60 tablet 0  . montelukast (SINGULAIR) 10 MG tablet Take 10 mg by mouth.    . ondansetron (ZOFRAN ODT) 4 MG disintegrating tablet Take 4 mg by mouth.    . potassium chloride (K-DUR) 10 MEQ tablet Take by mouth.    . rosuvastatin (CRESTOR) 40 MG tablet Take 1 tablet (40 mg total) by mouth daily. 30 tablet 0  . traMADol (ULTRAM) 50 MG tablet Take 1 tablet (50 mg total) by mouth every 6 (six) hours as needed for moderate pain or severe pain. 10 tablet 0   No facility-administered medications prior to visit.     PAST MEDICAL HISTORY: Past Medical History:  Diagnosis Date  . Anxiety   . Asthma   . Autonomic neuropathy     . Constipation   . COPD (chronic obstructive pulmonary disease) ([place])   . CTS (carpal tunnel syndrome)   . Depression   . Diabetes mellitus without complication ([place])   . Endometrial cancer ([place]) 2017  . GERD (gastroesophageal reflux disease)   . Headache(784.0)   . HTN (hypertension)   . Hypercholesterolemia   . IBS (irritable bowel syndrome)   . Osteoarthritis   . Shortness of breath   . Uterine cancer ([place]) 2017  . Vitamin D deficiency     PAST SURGICAL HISTORY: Past Surgical History:  Procedure Laterality Date  . BREAST REDUCTION SURGERY    . CESAREAN SECTION    . ESOPHAGOGASTRODUODENOSCOPY  05/22/2011   Procedure: ESOPHAGOGASTRODUODENOSCOPY (EGD);  Surgeon: Dorothyann Peng, MD;  Location[facility];  Service: Endoscopy;  Laterality: N/A;  . pancreatitis    . TUBAL LIGATION      FAMILY HISTORY: Family History  Problem Relation Age of Onset  . Diabetes Mother   . Hypertension Mother   . COPD Mother   . Asthma Mother   . Hypertension Sister   . Hyperlipidemia Sister   . Diabetes Sister   . Asthma Brother   . Alcohol abuse Brother   . Asthma Son   . Cancer Maternal Grandmother     lung, throat  . Heart disease Maternal Grandfather   . Hypertension Sister   . Hyperlipidemia Sister   . Colon cancer Neg Hx     SOCIAL HISTORY: Social History   Social History  . Marital status: Married    Spouse name: N/A  . Number of children: 1  . Years of education: N/A   Occupational History  . Not on file.   Social History Main Topics  . Smoking status: Current Every Day Smoker    Packs/day: 0.25    Years: 22.00    Types: Cigarettes  . Smokeless tobacco: Never Used  . Alcohol use No  . Drug use: No  . Sexual activity: Yes    Birth control/ protection: None, Surgical   Other Topics Concern  . Not on file   Social History Narrative   Drinks coffee occasionally, soda 2-3 daily       PHYSICAL EXAM  Vitals:   12/12/16 1433  BP: (!) 166/80  Pulse:  75  Resp: 20  Weight: 282 lb (127.9 kg)  Height: '5\' 5"'$  (1.651 m)  Body mass index is 46.93 kg/m.  Generalized: Well developed, in no acute distress   Neurological examination  Mentation: Alert oriented to time, place, history taking. Follows all commands speech and language fluent Cranial nerve II-XII: Pupils were equal round reactive to light. Extraocular movements were full, visual field were full on confrontational test. Facial sensation and strength were normal. Uvula tongue midline. Head turning and shoulder shrug  were normal and symmetric. Motor: The motor testing reveals 5 over 5 strength of all 4 extremities. Good symmetric motor tone is noted throughout.  Sensory: Sensory testing is intact to soft touch on all 4 extremities. No evidence of extinction is noted.  Coordination: Cerebellar testing reveals good finger-nose-finger and heel-to-shin bilaterally.  Gait and station: Gait is normal.  Reflexes: Deep tendon reflexes are symmetric and normal bilaterally.   DIAGNOSTIC DATA (LABS, IMAGING, TESTING) - I reviewed patient records, labs, notes, testing and imaging myself where available.  Lab Results  Component Value Date   WBC 8.0 12/23/2014   HGB 13.3 05/05/2015   HCT 39.0 05/05/2015   MCV 75.4 (L) 12/23/2014   PLT 366 12/23/2014      Component Value Date/Time   NA 134 (L) 11/20/2016 1145   K 3.9 11/20/2016 1145   CL 98 (L) 11/20/2016 1145   CO2 29 11/20/2016 1145   GLUCOSE 385 (H) 11/20/2016 1145   BUN 9 11/20/2016 1145   CREATININE 0.85 11/20/2016 1145   CALCIUM 10.1 11/20/2016 1145   PROT 7.9 12/23/2014 1310   ALBUMIN 3.8 12/23/2014 1310   AST 16 12/23/2014 1310   ALT 17 12/23/2014 1310   ALKPHOS 117 12/23/2014 1310   BILITOT 0.5 12/23/2014 1310   GFRNONAA >60 11/20/2016 1145   GFRAA >60 11/20/2016 1145       ASSESSMENT AND PLAN 51 y.o. year old female  has a past medical history of Anxiety; Asthma; Autonomic neuropathy; Constipation; COPD (chronic  obstructive pulmonary disease) (HCC); CTS (carpal tunnel syndrome); Depression; Diabetes mellitus without complication ([place]); Endometrial cancer ([place]) (2017); GERD (gastroesophageal reflux disease); Headache(784.0); HTN (hypertension); Hypercholesterolemia; IBS (irritable bowel syndrome); Osteoarthritis; Shortness of breath; Uterine cancer ([place]) (2017); and Vitamin D deficiency. here with:  1. Obstructive sleep apnea on CPAP  The patient's download shows better compliance. She is encouraged to continue using the CPAP nightly and each night greater than 4 hours. Her download also shows good treatment of her apnea. She is advised that if her symptoms worsen or she develops new symptoms she should let us know. She will follow-up in one year or sooner if needed.    Ward Givens, MSN, NP-C 12/12/2016, 2:45 PM [facility] [address] [place], [place] [address] [phone]

## 2016-12-12 NOTE — Patient Instructions (Signed)
Continue using CPAP nightly >4 hours each night If your symptoms worsen or you develop new symptoms please let us know.

## 2016-12-12 NOTE — Progress Notes (Signed)
I have read the note, and I agree with the clinical assessment and plan.  Sonya James,Sonya James   

## 2016-12-13 ENCOUNTER — Ambulatory Visit (INDEPENDENT_AMBULATORY_CARE_PROVIDER_SITE_OTHER): Payer: Medicaid Other | Admitting: Obstetrics and Gynecology

## 2016-12-13 ENCOUNTER — Encounter: Payer: Self-pay | Admitting: Obstetrics and Gynecology

## 2016-12-13 VITALS — BP 124/76 | HR 73 | Wt 279.0 lb

## 2016-12-13 DIAGNOSIS — B379 Candidiasis, unspecified: Secondary | ICD-10-CM

## 2016-12-13 DIAGNOSIS — L304 Erythema intertrigo: Secondary | ICD-10-CM | POA: Diagnosis not present

## 2016-12-13 MED ORDER — NYSTATIN 100000 UNIT/GM EX CREA: 1.0000 "application " | TOPICAL_CREAM | Freq: Two times a day (BID) | CUTANEOUS | 1 refills | Status: DC

## 2016-12-13 MED ORDER — FLUCONAZOLE 150 MG PO TABS: 150.0000 mg | ORAL_TABLET | ORAL | 1 refills | Status: DC

## 2016-12-13 MED ORDER — HYDROCORTISONE 1 % EX OINT: 1.0000 "application " | TOPICAL_OINTMENT | Freq: Two times a day (BID) | CUTANEOUS | 99 refills | Status: DC

## 2016-12-13 NOTE — Progress Notes (Signed)
[facility] Visit  @DATE @            Patient name: Sonya James MRN [id number]  Date of birth: 1966-08-28  CC & HPI:  Sonya James is a 51 y.o. female presenting today for persistent, unchanged vulvar itching and irritation that has been ongoing for the last few days. She has a history of DM.   ROS:  ROS +vulvar and groin irritation and itching -fever  Pertinent History Reviewed:   Reviewed: Significant for  Medical         Past Medical History:  Diagnosis Date  . Anxiety   . Asthma   . Autonomic neuropathy   . Constipation   . COPD (chronic obstructive pulmonary disease) ([place])   . CTS (carpal tunnel syndrome)   . Depression   . Diabetes mellitus without complication ([place])   . Endometrial cancer ([place]) 2017  . GERD (gastroesophageal reflux disease)   . Headache(784.0)   . HTN (hypertension)   . Hypercholesterolemia   . IBS (irritable bowel syndrome)   . Osteoarthritis   . Shortness of breath   . Uterine cancer ([place]) 2017  . Vitamin D deficiency                               Surgical Hx:    Past Surgical History:  Procedure Laterality Date  . BREAST REDUCTION SURGERY    . CESAREAN SECTION    . ESOPHAGOGASTRODUODENOSCOPY  05/22/2011   Procedure: ESOPHAGOGASTRODUODENOSCOPY (EGD);  Surgeon: Dorothyann Peng, MD;  Location: [facility];  Service: Endoscopy;  Laterality: N/A;  . pancreatitis    . TUBAL LIGATION     Medications: Reviewed & Updated - see associated section                       Current Outpatient Prescriptions:  .  acetaminophen (TYLENOL) 325 MG tablet, Take 650 mg by mouth., Disp: , Rfl:  .  albuterol (PROVENTIL) (2.5 MG/3ML) 0.083% nebulizer solution, Take 3 mLs (2.5 mg total) by nebulization every 6 (six) hours as needed for wheezing., Disp: 75 mL, Rfl: 12 .  esomeprazole (NEXIUM) 40 MG capsule, Take 1 capsule (40 mg total) by mouth every morning., Disp: 30 capsule, Rfl: 0 .  furosemide (LASIX) 40 MG tablet, Take 1 tablet (40 mg total) by  mouth daily., Disp: 30 tablet, Rfl: 0 .  gabapentin (NEURONTIN) 300 MG capsule, Take 300 mg by mouth 3 (three) times daily., Disp: , Rfl:  .  glipiZIDE (GLUCOTROL) 10 MG tablet, Take 1 tablet (10 mg total) by mouth daily before breakfast., Disp: 30 tablet, Rfl: 0 .  insulin NPH-regular Human (NOVOLIN 70/30) (70-30) 100 UNIT/ML injection, Inject 13 Units into the skin 2 (two) times daily with a meal. (Patient taking differently: Inject 35 Units into the skin daily with breakfast. ), Disp: 10 mL, Rfl: 11 .  lisinopril (PRINIVIL,ZESTRIL) 20 MG tablet, Take 1 tablet (20 mg total) by mouth daily., Disp: 30 tablet, Rfl: 0 .  metFORMIN (GLUCOPHAGE) 1000 MG tablet, Take 1 tablet (1,000 mg total) by mouth 2 (two) times daily with a meal., Disp: 60 tablet, Rfl: 0 .  montelukast (SINGULAIR) 10 MG tablet, Take 10 mg by mouth., Disp: , Rfl:  .  ondansetron (ZOFRAN ODT) 4 MG disintegrating tablet, Take 4 mg by mouth., Disp: , Rfl:  .  potassium chloride (K-DUR) 10 MEQ tablet, Take by  mouth., Disp: , Rfl:  .  rosuvastatin (CRESTOR) 40 MG tablet, Take 1 tablet (40 mg total) by mouth daily., Disp: 30 tablet, Rfl: 0 .  traMADol (ULTRAM) 50 MG tablet, Take 1 tablet (50 mg total) by mouth every 6 (six) hours as needed for moderate pain or severe pain., Disp: 10 tablet, Rfl: 0   Social History: Reviewed -  reports that she has been smoking Cigarettes.  She has a 5.50 pack-year smoking history. She has never used smokeless tobacco.  Objective Findings:  Vitals: Blood pressure 124/76, pulse 73, weight 279 lb (126.6 kg), last menstrual period 09/20/2015.  Physical Examination: General appearance - alert, well appearing, and in no distress Mental status - alert, oriented to person, place, and time Pelvic - Groin has 1 area of intertrigo, folliculitis to the mons pubis VULVA:  Excoriated labia majora.  VAGINA: normal vaginal secretions.  CERVIX and UTERUS: surgically absent ADNEXA: patient deferred     Assessment  & Plan:   A:  1. Intertrigo 2. Yeast infection 3. Folliculitis   P:  1. Treat with Micolog and Diflucan  2. Follow up 6 weeks    By signing my name below, I, Sonya James, attest that this documentation has been prepared under the direction and in the presence of Jonnie Kind, MD. Electronically Signed: Sonum James, Education administrator. 12/13/16. 3:13 PM.  I personally performed the services described in this documentation, which was SCRIBED in my presence. The recorded information has been reviewed and considered accurate. It has been edited as necessary during review. Jonnie Kind, MD

## 2016-12-14 ENCOUNTER — Ambulatory Visit (INDEPENDENT_AMBULATORY_CARE_PROVIDER_SITE_OTHER): Payer: Medicaid Other | Admitting: Gastroenterology

## 2016-12-14 ENCOUNTER — Other Ambulatory Visit: Payer: Self-pay

## 2016-12-14 ENCOUNTER — Telehealth: Payer: Self-pay

## 2016-12-14 ENCOUNTER — Telehealth: Payer: Self-pay | Admitting: Gastroenterology

## 2016-12-14 ENCOUNTER — Encounter: Payer: Self-pay | Admitting: Gastroenterology

## 2016-12-14 ENCOUNTER — Other Ambulatory Visit (HOSPITAL_COMMUNITY): Payer: Self-pay | Admitting: Internal Medicine

## 2016-12-14 VITALS — BP 135/80 | HR 76 | Temp 98.3°F | Ht 66.0 in | Wt 279.2 lb

## 2016-12-14 DIAGNOSIS — K5900 Constipation, unspecified: Secondary | ICD-10-CM

## 2016-12-14 DIAGNOSIS — Z1231 Encounter for screening mammogram for malignant neoplasm of breast: Secondary | ICD-10-CM

## 2016-12-14 DIAGNOSIS — M25569 Pain in unspecified knee: Secondary | ICD-10-CM

## 2016-12-14 DIAGNOSIS — Z1211 Encounter for screening for malignant neoplasm of colon: Secondary | ICD-10-CM

## 2016-12-14 DIAGNOSIS — R1310 Dysphagia, unspecified: Secondary | ICD-10-CM

## 2016-12-14 HISTORY — DX: Dysphagia, unspecified: R13.10

## 2016-12-14 MED ORDER — NYSTATIN 100000 UNIT/ML MT SUSP: 5.0000 mL | Freq: Four times a day (QID) | OROMUCOSAL | 0 refills | Status: DC

## 2016-12-14 MED ORDER — NA SULFATE-K SULFATE-MG SULF 17.5-3.13-1.6 GM/177ML PO SOLN: 1.0000 | ORAL | 0 refills | Status: DC

## 2016-12-14 NOTE — Assessment & Plan Note (Signed)
51 year old female with new onset dysphagia, some odynophagia, and evidence of oral thrush on exam. From reviewing medical record, she had H.pylori gastritis on last EGD in 2012 but tells me she doesn't remember taking antibiotics due to cost. Poor historian, so I am unsure if she actually ever took this or informed our office. Likely dysphagia is multifactorial in setting of GERD, possible candida esophagitis. No epigastric pain, N/V, but at time of EGD can take biopsies for H.pylori.   Proceed with upper endoscopy/dilation in the near future with Dr. Oneida Alar. The risks, benefits, and alternatives have been discussed in detail with patient. They have stated understanding and desire to proceed.  PROPOFOL Continue Nexium once daily Nystatin swish and swallow sent to pharmacy

## 2016-12-14 NOTE — Telephone Encounter (Signed)
Please refer to Dr. Aline Brochure for knee pain.

## 2016-12-14 NOTE — Telephone Encounter (Signed)
Referral has been sent.

## 2016-12-14 NOTE — Assessment & Plan Note (Addendum)
Doing well with Linzess once daily, but she is unsure the dosage. We are checking on this. Needs initial screening colonoscopy at time of EGD.   Proceed with colonoscopy with Dr. Oneida Alar in the near future. The risks, benefits, and alternatives have been discussed in detail with the patient. They state understanding and desire to proceed.  PROPOFOL due to polypharmacy  Continue Linzess at current dosing as this works well for her (ADDENDUM, this is 290 mcg) 3 month return

## 2016-12-14 NOTE — Progress Notes (Signed)
cc'ed to pcp °

## 2016-12-14 NOTE — Telephone Encounter (Signed)
PATIENT CALLED TO LET YOU KNOW THAT THE LINZESS SHE TAKES IS 290

## 2016-12-14 NOTE — Telephone Encounter (Signed)
AB advised no insulin the night before TCS/EGD/DIL and no diabetic medications the morning of procedure. Noted on instructions.

## 2016-12-14 NOTE — Telephone Encounter (Signed)
Called and informed pt of pre-op appt 12/28/16 at 1:45pm. Letter also mailed.

## 2016-12-14 NOTE — Progress Notes (Addendum)
Primary Care Physician:  Rogers Blocker, MD Primary Gastroenterologist:  Dr. Oneida Alar   Chief Complaint  Patient presents with  . Gastroesophageal Reflux    HPI:   Sonya James is a 51 y.o. female presenting today at the request of the ED. She was last seen in 2012 with abdominal pain but no-showed for follow-up. Overdue for initial screening colonoscopy.   States she is on Linzess but not sure the dosage. States she "can't poop". Takes one capsule daily. When takes every day, she will have a BM and feels productive. Will go a whole month without a BM if not taking anything. No rectal bleeding.   Mild periumbilical pain at times, states she has a "hernia and they are going to keep watch on it". Worsened with movement, lifting heavy.   Has GERD. Takes Nexium 40 mg daily. Takes first thing in the morning on an empty stomach. Went to [employer] with reflux that was "hot", felt like it burnt her tongue. States her taste buds went away but coming back gradually. Feels really thick in her throat, sometimes hard to swallow and has to drink lots of water to wash it down. Sometimes odynophagia.   Just diagnosed with vaginal yeast infection by Dr. Glo Herring and taking Diflucan once a week for 4 weeks. Using cream as well.   EGD in 2012 with severe H.pylori gastritis. Didn't take the antibiotics as it was too expensive.    Past Medical History:  Diagnosis Date  . Anxiety   . Asthma   . Autonomic neuropathy   . Constipation   . COPD (chronic obstructive pulmonary disease) ([place])   . CTS (carpal tunnel syndrome)   . Depression   . Diabetes mellitus without complication ([place])   . Endometrial cancer ([place]) 2017  . GERD (gastroesophageal reflux disease)   . Headache(784.0)   . HTN (hypertension)   . Hypercholesterolemia   . IBS (irritable bowel syndrome)   . Osteoarthritis   . Pancreatitis    per patient   . Shortness of breath   . Sleep apnea    CPAP machine  . Uterine cancer ([place]) 2017    . Vitamin D deficiency     Past Surgical History:  Procedure Laterality Date  . ABDOMINAL HYSTERECTOMY     endometrial cancer, surgery done at [facility]   . BREAST REDUCTION SURGERY    . CESAREAN SECTION    . ESOPHAGOGASTRODUODENOSCOPY  05/22/2011   Dr. Oneida Alar: H.pylori gastritis   . FOOT SURGERY    . TUBAL LIGATION      Current Outpatient Prescriptions  Medication Sig Dispense Refill  . acetaminophen (TYLENOL) 325 MG tablet Take 650 mg by mouth.    [name] albuterol (PROVENTIL) (2.5 MG/3ML) 0.083% nebulizer solution Take 3 mLs (2.5 mg total) by nebulization every 6 (six) hours as needed for wheezing. 75 mL 12  . esomeprazole (NEXIUM) 40 MG capsule Take 1 capsule (40 mg total) by mouth every morning. 30 capsule 0  . fluconazole (DIFLUCAN) 150 MG tablet Take 1 tablet (150 mg total) by mouth once a week. 4 tablet 1  . furosemide (LASIX) 40 MG tablet Take 1 tablet (40 mg total) by mouth daily. 30 tablet 0  . gabapentin (NEURONTIN) 300 MG capsule Take 300 mg by mouth 3 (three) times daily.    [name] glipiZIDE (GLUCOTROL) 10 MG tablet Take 1 tablet (10 mg total) by mouth daily before breakfast. 30 tablet 0  . hydrocortisone 1 % ointment Apply  1 application topically 2 (two) times daily. 30 g prn  . insulin NPH-regular Human (NOVOLIN 70/30) (70-30) 100 UNIT/ML injection Inject into the skin.    [name] lisinopril (PRINIVIL,ZESTRIL) 20 MG tablet Take 1 tablet (20 mg total) by mouth daily. 30 tablet 0  . metFORMIN (GLUCOPHAGE) 1000 MG tablet Take 1 tablet (1,000 mg total) by mouth 2 (two) times daily with a meal. 60 tablet 0  . montelukast (SINGULAIR) 10 MG tablet Take 10 mg by mouth.    . nystatin cream (MYCOSTATIN) Apply 1 application topically 2 (two) times daily. 30 g 1  . ondansetron (ZOFRAN ODT) 4 MG disintegrating tablet Take 4 mg by mouth.    . potassium chloride (K-DUR) 10 MEQ tablet Take by mouth.    . rosuvastatin (CRESTOR) 40 MG tablet Take 1 tablet (40 mg total) by mouth daily. 30 tablet 0  .  SITagliptin Phosphate (JANUVIA PO) Take by mouth.    . Na Sulfate-K Sulfate-Mg Sulf (SUPREP BOWEL PREP KIT) 17.5-3.13-1.6 GM/180ML SOLN Take 1 kit by mouth as directed. 1 Bottle 0  . nystatin (MYCOSTATIN) 100000 UNIT/ML suspension Take 5 mLs (500,000 Units total) by mouth 4 (four) times daily. 473 mL 0   No current facility-administered medications for this visit.     Allergies as of 12/14/2016  . (No Known Allergies)    Family History  Problem Relation Age of Onset  . Diabetes Mother   . Hypertension Mother   . COPD Mother   . Asthma Mother   . Hypertension Sister   . Hyperlipidemia Sister   . Diabetes Sister   . Asthma Brother   . Alcohol abuse Brother   . Asthma Son   . Cancer Maternal Grandmother     lung, throat  . Heart disease Maternal Grandfather   . Hypertension Sister   . Hyperlipidemia Sister   . Colon cancer Neg Hx     Social History   Social History  . Marital status: Married    Spouse name: N/A  . Number of children: 1  . Years of education: N/A   Occupational History  . Not on file.   Social History Main Topics  . Smoking status: Current Every Day Smoker    Packs/day: 0.25    Years: 22.00    Types: Cigarettes  . Smokeless tobacco: Never Used     Comment: 2-3 a day  . Alcohol use No  . Drug use: No  . Sexual activity: Yes    Birth control/ protection: None, Surgical   Other Topics Concern  . Not on file   Social History Narrative   Drinks coffee occasionally, soda 2-3 daily     Review of Systems: As mentioned in HPI.   Physical Exam: BP 135/80   Pulse 76   Temp 98.3 F (36.8 C) (Oral)   Ht '5\' 6"'$  (1.676 m)   Wt 279 lb 3.2 oz (126.6 kg)   LMP 09/20/2015 (Exact Date)   BMI 45.06 kg/m  General:   Alert and oriented. Pleasant and cooperative. Well-nourished and well-developed.  Head:  Normocephalic and atraumatic. Eyes:  Without icterus, sclera clear and conjunctiva pink.  Ears:  Normal auditory acuity. Nose:  No deformity,  discharge,  or lesions. Mouth:  No deformity or lesions, oral thrush on tongue noted.  Lungs:  Clear to auscultation bilaterally. No wheezes, rales, or rhonchi. No distress.  Heart:  S1, S2 present without murmurs appreciated.  Abdomen:  +BS, soft, mild TTP epigastric region and non-distended. No  HSM noted. No guarding or rebound. No masses appreciated. Obese.  Rectal:  Deferred  Msk:  Symmetrical without gross deformities. Normal posture. Extremities:  Without  edema. Neurologic:  Alert and  oriented x4 Psych:  Alert and cooperative. Normal mood and affect.

## 2016-12-14 NOTE — Patient Instructions (Signed)
I have sent in a swish and swallow medication called Nystatin for the oral yeast in your mouth. Swish this around 4 times a day and swallow.   Continue Nexium once each morning, 30 minutes before breakfast. I want you to continue Linzess. I need to see what dosage you are on so I can update you medication list.   We have scheduled you for a colonoscopy, upper endoscopy and dilation with Dr. Oneida Alar.   We will see you in 3 months!

## 2016-12-22 ENCOUNTER — Encounter (HOSPITAL_COMMUNITY): Payer: Self-pay | Admitting: *Deleted

## 2016-12-22 ENCOUNTER — Emergency Department (HOSPITAL_COMMUNITY)
Admission: EM | Admit: 2016-12-22 | Discharge: 2016-12-22 | Disposition: A | Discharge status: Home / Self Care | Payer: Medicaid Other | Attending: Emergency Medicine | Admitting: Emergency Medicine

## 2016-12-22 DIAGNOSIS — E119 Type 2 diabetes mellitus without complications: Secondary | ICD-10-CM | POA: Insufficient documentation

## 2016-12-22 DIAGNOSIS — L089 Local infection of the skin and subcutaneous tissue, unspecified: Secondary | ICD-10-CM | POA: Insufficient documentation

## 2016-12-22 DIAGNOSIS — F17210 Nicotine dependence, cigarettes, uncomplicated: Secondary | ICD-10-CM | POA: Diagnosis not present

## 2016-12-22 DIAGNOSIS — Z7984 Long term (current) use of oral hypoglycemic drugs: Secondary | ICD-10-CM | POA: Insufficient documentation

## 2016-12-22 DIAGNOSIS — B958 Unspecified staphylococcus as the cause of diseases classified elsewhere: Secondary | ICD-10-CM | POA: Diagnosis not present

## 2016-12-22 DIAGNOSIS — J45909 Unspecified asthma, uncomplicated: Secondary | ICD-10-CM | POA: Diagnosis not present

## 2016-12-22 DIAGNOSIS — I10 Essential (primary) hypertension: Secondary | ICD-10-CM | POA: Diagnosis not present

## 2016-12-22 DIAGNOSIS — R079 Chest pain, unspecified: Secondary | ICD-10-CM | POA: Diagnosis present

## 2016-12-22 DIAGNOSIS — J449 Chronic obstructive pulmonary disease, unspecified: Secondary | ICD-10-CM | POA: Insufficient documentation

## 2016-12-22 MED ORDER — HYDROCODONE-ACETAMINOPHEN 5-325 MG PO TABS: ORAL_TABLET | ORAL | 0 refills | Status: DC

## 2016-12-22 MED ORDER — SULFAMETHOXAZOLE-TRIMETHOPRIM 800-160 MG PO TABS: 1.0000 | ORAL_TABLET | Freq: Two times a day (BID) | ORAL | 0 refills | Status: AC

## 2016-12-22 MED ORDER — FLUCONAZOLE 200 MG PO TABS: 200.0000 mg | ORAL_TABLET | Freq: Once | ORAL | 1 refills | Status: AC

## 2016-12-22 MED ORDER — SULFAMETHOXAZOLE-TRIMETHOPRIM 800-160 MG PO TABS
1.0000 | ORAL_TABLET | Freq: Once | ORAL | Status: AC
  Administered 2016-12-22: 1 via ORAL
  Filled 2016-12-22: qty 1

## 2016-12-22 MED ORDER — MUPIROCIN 2 % EX OINT: TOPICAL_OINTMENT | CUTANEOUS | 0 refills | Status: DC

## 2016-12-22 NOTE — ED Triage Notes (Signed)
Pt reports abscess to back and buttocks for the last 3 days-   Dr Marlou Sa is PCP

## 2016-12-22 NOTE — Discharge Instructions (Signed)
Warm wet compresses to the affected areas.  Take the antibiotic as directed until its finished.  Take the diflucan only if needed,.

## 2016-12-22 NOTE — ED Triage Notes (Signed)
Pt reports abscess to her right buttocks x 1 week.

## 2016-12-22 NOTE — ED Notes (Signed)
Pt alert & oriented x4, stable gait. Patient given discharge instructions, paperwork & prescription(s). Patient informed not to drive, operate any equipment & handel any important documents 4 hours after taking pain medication. Patient  instructed to stop at the registration desk to finish any additional paperwork. Patient  verbalized understanding. Pt left department w/ no further questions. 

## 2016-12-25 ENCOUNTER — Telehealth: Payer: Self-pay

## 2016-12-25 MED ORDER — NA SULFATE-K SULFATE-MG SULF 17.5-3.13-1.6 GM/177ML PO SOLN: 1.0000 | ORAL | 0 refills | Status: DC

## 2016-12-25 NOTE — Telephone Encounter (Signed)
Wow. Ok, yes, that was really early. Thanks for reviewing with her.

## 2016-12-25 NOTE — Telephone Encounter (Signed)
Patient called because she drunk her prep to soon. She is wanting to know if we could call in another prep for her. I told her that I would sent in one for her and when over the instructions with her again to make sure that she understood them.

## 2016-12-25 NOTE — Progress Notes (Signed)
REVIEWED-NO ADDITIONAL RECOMMENDATIONS. 

## 2016-12-26 NOTE — ED Provider Notes (Signed)
[facility] DEPT Provider Note   CSN: [id number] Arrival date & time: 12/22/16  2993     History   Chief Complaint Chief Complaint  Patient presents with  . Abscess    HPI Sonya James is a 51 y.o. female.  HPI   Sonya James is a 51 y.o. female who presents to the Emergency Department complaining of multiple, painful boils to her chest, buttocks and back. symptoms began three days prior to arrival. She reports pain with sitting and drainage from one boil to the buttock.  She denies fever, abdominal pain, vomiting and chills.     Past Medical History:  Diagnosis Date  . Anxiety   . Asthma   . Autonomic neuropathy   . Constipation   . COPD (chronic obstructive pulmonary disease) ([place])   . CTS (carpal tunnel syndrome)   . Depression   . Diabetes mellitus without complication ([place])   . Endometrial cancer ([place]) 2017  . GERD (gastroesophageal reflux disease)   . Headache(784.0)   . HTN (hypertension)   . Hypercholesterolemia   . IBS (irritable bowel syndrome)   . Osteoarthritis   . Pancreatitis    per patient   . Shortness of breath   . Sleep apnea    CPAP machine  . Uterine cancer ([place]) 2017  . Vitamin D deficiency     Patient Active Problem List   Diagnosis Date Noted  . Dysphagia 12/14/2016  . Endometrial cancer ([place]) 10/25/2015  . Diabetes mellitus with neuropathy ([place]) 10/25/2015  . Cancer of endometrium ([place]) 10/18/2015  . Menometrorrhagia 10/11/2015  . COPD exacerbation ([place]) 07/04/2014  . Type II or unspecified type diabetes mellitus without mention of complication, not stated as uncontrolled 05/27/2013  . Acute bronchitis 05/25/2013  . Fever 05/25/2013  . Asthma 05/25/2013  . COPD (chronic obstructive pulmonary disease) ([place]) 05/25/2013  . Tobacco abuse 05/25/2013  . Obesity 05/25/2013  . HTN (hypertension), benign 05/25/2013  . Hypercholesteremia 05/25/2013  . Pedal edema 05/25/2013  . Thyromegaly 05/25/2013  . Abdominal pain 04/11/2011    . Constipation 04/11/2011  . GERD (gastroesophageal reflux disease) 04/11/2011    Past Surgical History:  Procedure Laterality Date  . ABDOMINAL HYSTERECTOMY     endometrial cancer, surgery done at [facility]   . BREAST REDUCTION SURGERY    . CESAREAN SECTION    . ESOPHAGOGASTRODUODENOSCOPY  05/22/2011   Dr. Oneida Alar: H.pylori gastritis   . FOOT SURGERY    . TUBAL LIGATION      OB History    No data available       Home Medications    Prior to Admission medications   Medication Sig Start Date End Date Taking? Authorizing Provider  acetaminophen (TYLENOL) 325 MG tablet Take 325 mg by mouth 2 (two) times daily as needed for mild pain.  11/05/15   Historical Provider, MD  albuterol (PROVENTIL HFA;VENTOLIN HFA) 108 (90 Base) MCG/ACT inhaler Inhale 2 puffs into the lungs every 6 (six) hours as needed for wheezing or shortness of breath.    Historical Provider, MD  albuterol (PROVENTIL) (2.5 MG/3ML) 0.083% nebulizer solution Take 3 mLs (2.5 mg total) by nebulization every 6 (six) hours as needed for wheezing. 07/05/14   Kathie Dike, MD  cycloSPORINE (RESTASIS) 0.05 % ophthalmic emulsion Place 1 drop into both eyes daily.    Historical Provider, MD  esomeprazole (NEXIUM) 40 MG capsule Take 1 capsule (40 mg total) by mouth every morning. 11/20/16   Ledell Noss, MD  fluconazole (DIFLUCAN)  50 MG tablet Take 50 mg by mouth once as needed.    Historical Provider, MD  furosemide (LASIX) 40 MG tablet Take 1 tablet (40 mg total) by mouth daily. 05/27/13   Kathie Dike, MD  gabapentin (NEURONTIN) 300 MG capsule Take 300 mg by mouth 3 (three) times daily.    Historical Provider, MD  glipiZIDE (GLUCOTROL) 10 MG tablet Take 1 tablet (10 mg total) by mouth daily before breakfast. 07/05/14   Kathie Dike, MD  HYDROcodone-acetaminophen (NORCO/VICODIN) 5-325 MG tablet Take one tab po q 4-6 hrs prn pain Patient taking differently: Take 1 tablet by mouth 2 (two) times daily as needed for moderate pain.   12/22/16   Lourdez Mcgahan, PA-C  hydrocortisone 1 % ointment Apply 1 application topically 2 (two) times daily. 12/13/16   Jonnie Kind, MD  insulin NPH-regular Human (NOVOLIN 70/30) (70-30) 100 UNIT/ML injection Inject 35-45 Units into the skin 3 (three) times daily. 35 units in the morning at breakffast and 35 units in the evening with supper and 45 units at bedtime    Historical Provider, MD  linaclotide (LINZESS) 290 MCG CAPS capsule Take 290 mcg by mouth daily before breakfast.    Historical Provider, MD  lisinopril (PRINIVIL,ZESTRIL) 20 MG tablet Take 1 tablet (20 mg total) by mouth daily. 07/05/14   Kathie Dike, MD  metFORMIN (GLUCOPHAGE) 1000 MG tablet Take 1 tablet (1,000 mg total) by mouth 2 (two) times daily with a meal. 07/05/14   Kathie Dike, MD  montelukast (SINGULAIR) 10 MG tablet Take 10 mg by mouth at bedtime.     Historical Provider, MD  mupirocin ointment (BACTROBAN) 2 % Apply TID to affected areas for 10 days Patient taking differently: Apply 1 application topically 3 (three) times daily. Apply TID to affected areas for 10 days 12/22/16   Eveleigh Crumpler, PA-C  Na Sulfate-K Sulfate-Mg Sulf (SUPREP BOWEL PREP KIT) 17.5-3.13-1.6 GM/180ML SOLN Take 1 kit by mouth as directed. Patient taking differently: Take 1 kit by mouth as directed. Will start 12-31-16 12/25/16   Danie Binder, MD  nystatin (MYCOSTATIN) 100000 UNIT/ML suspension Take 5 mLs (500,000 Units total) by mouth 4 (four) times daily. 12/14/16   Annitta Needs, NP  nystatin cream (MYCOSTATIN) Apply 1 application topically 2 (two) times daily. 12/13/16   Jonnie Kind, MD  ondansetron (ZOFRAN ODT) 4 MG disintegrating tablet Take 4 mg by mouth every 8 (eight) hours as needed for nausea or vomiting.  10/11/15   Historical Provider, MD  potassium chloride (K-DUR) 10 MEQ tablet Take 10 mEq by mouth every evening.  07/05/14   Historical Provider, MD  rosuvastatin (CRESTOR) 40 MG tablet Take 1 tablet (40 mg total) by mouth  daily. Patient taking differently: Take 40 mg by mouth every evening.  07/05/14   Kathie Dike, MD  sitaGLIPtin (JANUVIA) 100 MG tablet Take 100 mg by mouth daily.    Historical Provider, MD  sulfamethoxazole-trimethoprim (BACTRIM DS,SEPTRA DS) 800-160 MG tablet Take 1 tablet by mouth 2 (two) times daily. Patient taking differently: Take 1 tablet by mouth 2 (two) times daily. Started 12-13-16 for infection 12/22/16 01/01/17  Enolia Koepke, PA-C  traMADol (ULTRAM) 50 MG tablet Take 50 mg by mouth every 6 (six) hours as needed for moderate pain.    Historical Provider, MD    Family History Family History  Problem Relation Age of Onset  . Diabetes Mother   . Hypertension Mother   . COPD Mother   . Asthma Mother   .  Hypertension Sister   . Hyperlipidemia Sister   . Diabetes Sister   . Asthma Brother   . Alcohol abuse Brother   . Asthma Son   . Cancer Maternal Grandmother     lung, throat  . Heart disease Maternal Grandfather   . Hypertension Sister   . Hyperlipidemia Sister   . Colon cancer Neg Hx     Social History Social History  Substance Use Topics  . Smoking status: Current Every Day Smoker    Packs/day: 0.25    Years: 22.00    Types: Cigarettes  . Smokeless tobacco: Never Used     Comment: 2-3 a day  . Alcohol use No     Allergies   Patient has no known allergies.   Review of Systems Review of Systems  Constitutional: Negative for chills and fever.  Respiratory: Negative for shortness of breath.   Gastrointestinal: Negative for nausea and vomiting.  Musculoskeletal: Negative for arthralgias and joint swelling.  Skin: Positive for color change and rash.       Multiple "sore bumps"  Hematological: Negative for adenopathy.  All other systems reviewed and are negative.    Physical Exam Updated Vital Signs BP (!) 144/72 (BP Location: Right Arm)   Pulse 80   Temp 98.3 F (36.8 C) (Oral)   Resp 20   Ht '5\' 6"'$  (1.676 m)   Wt 124.9 kg   LMP 09/20/2015 (Exact  Date)   SpO2 100%   BMI 44.46 kg/m   Physical Exam  Constitutional: She is oriented to person, place, and time. She appears well-developed and well-nourished. No distress.  HENT:  Head: Normocephalic and atraumatic.  Mouth/Throat: Oropharynx is clear and moist.  Cardiovascular: Normal rate, regular rhythm and normal heart sounds.   No murmur heard. Pulmonary/Chest: Effort normal and breath sounds normal. No respiratory distress.  Neurological: She is alert and oriented to person, place, and time. She exhibits normal muscle tone. Coordination normal.  Skin: Skin is warm and dry. There is erythema.  Multiple <1 cm pustules to trunk and right lower leg.  Few are open and draining purulent fluid.  No surrounding erythema.    Nursing note and vitals reviewed.    ED Treatments / Results  Labs (all labs ordered are listed, but only abnormal results are displayed) Labs Reviewed - No data to display  EKG  EKG Interpretation None       Radiology No results found.  Procedures Procedures (including critical care time)  Medications Ordered in ED Medications  sulfamethoxazole-trimethoprim (BACTRIM DS,SEPTRA DS) 800-160 MG per tablet 1 tablet (1 tablet Oral Given 12/22/16 1946)     Initial Impression / Assessment and Plan / ED Course  I have reviewed the triage vital signs and the nursing notes.  Pertinent labs & imaging results that were available during my care of the patient were reviewed by me and considered in my medical decision making (see chart for details).     Pt is well appearing, multiple small pustules and draining abscess to the right buttock.  Pt agrees to warm water soaks, abx .  Likely MRSA.  Do not appear to need I&D at this time.  Return precautions discussed.   Final Clinical Impressions(s) / ED Diagnoses   Final diagnoses:  Staph skin infection    New Prescriptions Discharge Medication List as of 12/22/2016  7:45 PM    START taking these medications    Details  HYDROcodone-acetaminophen (NORCO/VICODIN) 5-325 MG tablet Take one tab po q  4-6 hrs prn pain, Print    mupirocin ointment (BACTROBAN) 2 % Apply TID to affected areas for 10 days, Print    sulfamethoxazole-trimethoprim (BACTRIM DS,SEPTRA DS) 800-160 MG tablet Take 1 tablet by mouth 2 (two) times daily., Starting Fri 12/22/2016, Until Mon 01/01/2017, Print         Kwana Ringel [place], PA-C 12/26/16 2479    Isla Pence, MD 12/26/16 2308

## 2016-12-27 NOTE — Patient Instructions (Signed)
Sonya James  12/27/2016     @PREFPERIOPPHARMACY @   Your procedure is scheduled on  01/02/2017  Report to [facility] at  8  A.M.  Call this number if you have problems the morning of surgery:  [phone]   Remember:  Do not eat food or drink liquids after midnight.  Take these medicines the morning of surgery with A SIP OF WATER  neurontin, hydrocodone, lisinopril, singulair, zofran, ultram. Ude your inhaler and nebulizer before you come/ Take 1/2 of your normal insulin dosage the night before your surgery. DO NOT take any medication for diabetes the morning of your procedure.   Do not wear jewelry, make-up or nail polish.  Do not wear lotions, powders, or perfumes, or deoderant.  Do not shave 48 hours prior to surgery.  Men may shave face and neck.  Do not bring valuables to the hospital.  [facility] is not responsible for any belongings or valuables.  Contacts, dentures or bridgework may not be worn into surgery.  Leave your suitcase in the car.  After surgery it may be brought to your room.  For patients admitted to the hospital, discharge time will be determined by your treatment team.  Patients discharged the day of surgery will not be allowed to drive home.   Name and phone number of your driver:    Family   Special instructions:  Follow the diet and prep instructions given to you by Dr Nona Dell office.  Please read over the following fact sheets that you were given. Anesthesia Post-op Instructions and Care and Recovery After Surgery       Esophagogastroduodenoscopy Esophagogastroduodenoscopy (EGD) is a procedure to examine the lining of the esophagus, stomach, and first part of the small intestine (duodenum). This procedure is done to check for problems such as inflammation, bleeding, ulcers, or growths. During this procedure, a long, flexible, lighted tube with a camera attached (endoscope) is inserted down the throat. Tell a health  care provider about:  Any allergies you have.  All medicines you are taking, including vitamins, herbs, eye drops, creams, and over-the-counter medicines.  Any problems you or family members have had with anesthetic medicines.  Any blood disorders you have.  Any surgeries you have had.  Any medical conditions you have.  Whether you are pregnant or may be pregnant. What are the risks? Generally, this is a safe procedure. However, problems may occur, including:  Infection.  Bleeding.  A tear (perforation) in the esophagus, stomach, or duodenum.  Trouble breathing.  Excessive sweating.  Spasms of the larynx.  A slowed heartbeat.  Low blood pressure. What happens before the procedure?  Follow instructions from your health care provider about eating or drinking restrictions.  Ask your health care provider about:  Changing or stopping your regular medicines. This is especially important if you are taking diabetes medicines or blood thinners.  Taking medicines such as aspirin and ibuprofen. These medicines can thin your blood. Do not take these medicines before your procedure if your health care provider instructs you not to.  Plan to have someone take you home after the procedure.  If you wear dentures, be ready to remove them before the procedure. What happens during the procedure?  To reduce your risk of infection, your health care team will wash or sanitize their hands.  An IV tube will be put in a vein in your hand or  arm. You will get medicines and fluids through this tube.  You will be given one or more of the following:  A medicine to help you relax (sedative).  A medicine to numb the area (local anesthetic). This medicine may be sprayed into your throat. It will make you feel more comfortable and keep you from gagging or coughing during the procedure.  A medicine for pain.  A mouth guard may be placed in your mouth to protect your teeth and to keep you  from biting on the endoscope.  You will be asked to lie on your left side.  The endoscope will be lowered down your throat into your esophagus, stomach, and duodenum.  Air will be put into the endoscope. This will help your health care provider see better.  The lining of your esophagus, stomach, and duodenum will be examined.  Your health care provider may:  Take a tissue sample so it can be looked at in a lab (biopsy).  Remove growths.  Remove objects (foreign bodies) that are stuck.  Treat any bleeding with medicines or other devices that stop tissue from bleeding.  Widen (dilate) or stretch narrowed areas of your esophagus and stomach.  The endoscope will be taken out. The procedure may vary among health care providers and hospitals. What happens after the procedure?  Your blood pressure, heart rate, breathing rate, and blood oxygen level will be monitored often until the medicines you were given have worn off.  Do not eat or drink anything until the numbing medicine has worn off and your gag reflex has returned. This information is not intended to replace advice given to you by your health care provider. Make sure you discuss any questions you have with your health care provider. Document Released: 12/29/2004 Document Revised: 02/03/2016 Document Reviewed: 07/22/2015 Elsevier Interactive Patient Education  2017 [place]. Esophagogastroduodenoscopy, Care After Refer to this sheet in the next few weeks. These instructions provide you with information about caring for yourself after your procedure. Your health care provider may also give you more specific instructions. Your treatment has been planned according to current medical practices, but problems sometimes occur. Call your health care provider if you have any problems or questions after your procedure. What can I expect after the procedure? After the procedure, it is common to have:  A sore  throat.  Nausea.  Bloating.  Dizziness.  Fatigue. Follow these instructions at home:  Do not eat or drink anything until the numbing medicine (local anesthetic) has worn off and your gag reflex has returned. You will know that the local anesthetic has worn off when you can swallow comfortably.  Do not drive for 24 hours if you received a medicine to help you relax (sedative).  If your health care provider took a tissue sample for testing during the procedure, make sure to get your test results. This is your responsibility. Ask your health care provider or the department performing the test when your results will be ready.  Keep all follow-up visits as told by your health care provider. This is important. Contact a health care provider if:  You cannot stop coughing.  You are not urinating.  You are urinating less than usual. Get help right away if:  You have trouble swallowing.  You cannot eat or drink.  You have throat or chest pain that gets worse.  You are dizzy or light-headed.  You faint.  You have nausea or vomiting.  You have chills.  You have a  fever.  You have severe abdominal pain.  You have black, tarry, or bloody stools. This information is not intended to replace advice given to you by your health care provider. Make sure you discuss any questions you have with your health care provider. Document Released: 08/14/2012 Document Revised: 02/03/2016 Document Reviewed: 07/22/2015 Elsevier Interactive Patient Education  2017 [employer]  Esophageal Dilatation Esophageal dilatation is a procedure to open a blocked or narrowed part of the esophagus. The esophagus is the long tube in your throat that carries food and liquid from your mouth to your stomach. The procedure is also called esophageal dilation. You may need this procedure if you have a buildup of scar tissue in your esophagus that makes it difficult, painful, or even impossible to swallow. This can  be caused by gastroesophageal reflux disease (GERD). In rare cases, people need this procedure because they have cancer of the esophagus or a problem with the way food moves through the esophagus. Sometimes you may need to have another dilatation to enlarge the opening of the esophagus gradually. Tell a health care provider about:  Any allergies you have.  All medicines you are taking, including vitamins, herbs, eye drops, creams, and over-the-counter medicines.  Any problems you or family members have had with anesthetic medicines.  Any blood disorders you have.  Any surgeries you have had.  Any medical conditions you have.  Any antibiotic medicines you are required to take before dental procedures. What are the risks? Generally, this is a safe procedure. However, problems can occur and include:  Bleeding from a tear in the lining of the esophagus.  A hole (perforation) in the esophagus. What happens before the procedure?  Do not eat or drink anything after midnight on the night before the procedure or as directed by your health care provider.  Ask your health care provider about changing or stopping your regular medicines. This is especially important if you are taking diabetes medicines or blood thinners.  Plan to have someone take you home after the procedure. What happens during the procedure?  You will be given a medicine that makes you relaxed and sleepy (sedative).  A medicine may be sprayed or gargled to numb the back of the throat.  Your health care provider can use various instruments to do an esophageal dilatation. During the procedure, the instrument used will be placed in your mouth and passed down into your esophagus. Options include:  Simple dilators. This instrument is carefully placed in the esophagus to stretch it.  Guided wire bougies. In this method, a flexible tube (endoscope) is used to insert a wire into the esophagus. The dilator is passed over this  wire to enlarge the esophagus. Then the wire is removed.  Balloon dilators. An endoscope with a small balloon at the end is passed down into the esophagus. Inflating the balloon gently stretches the esophagus and opens it up. What happens after the procedure?  Your blood pressure, heart rate, breathing rate, and blood oxygen level will be monitored often until the medicines you were given have worn off.  Your throat may feel slightly sore and will probably still feel numb. This will improve slowly over time.  You will not be allowed to eat or drink until the throat numbness has resolved.  If this is a same-day procedure, you may be allowed to go home once you have been able to drink, urinate, and sit on the edge of the bed without nausea or dizziness.  If this  is a same-day procedure, you should have a friend or family member with you for the next 24 hours after the procedure. This information is not intended to replace advice given to you by your health care provider. Make sure you discuss any questions you have with your health care provider. Document Released: 10/19/2005 Document Revised: 02/03/2016 Document Reviewed: 01/07/2014 Elsevier Interactive Patient Education  2017 [place].  Colonoscopy, Adult A colonoscopy is an exam to look at the entire large intestine. During the exam, a lubricated, bendable tube is inserted into the anus and then passed into the rectum, colon, and other parts of the large intestine. A colonoscopy is often done as a part of normal colorectal screening or in response to certain symptoms, such as anemia, persistent diarrhea, abdominal pain, and blood in the stool. The exam can help screen for and diagnose medical problems, including:  Tumors.  Polyps.  Inflammation.  Areas of bleeding. Tell a health care provider about:  Any allergies you have.  All medicines you are taking, including vitamins, herbs, eye drops, creams, and over-the-counter  medicines.  Any problems you or family members have had with anesthetic medicines.  Any blood disorders you have.  Any surgeries you have had.  Any medical conditions you have.  Any problems you have had passing stool. What are the risks? Generally, this is a safe procedure. However, problems may occur, including:  Bleeding.  A tear in the intestine.  A reaction to medicines given during the exam.  Infection (rare). What happens before the procedure? Eating and drinking restrictions  Follow instructions from your health care provider about eating and drinking, which may include:  A few days before the procedure - follow a low-fiber diet. Avoid nuts, seeds, dried fruit, raw fruits, and vegetables.  1-3 days before the procedure - follow a clear liquid diet. Drink only clear liquids, such as clear broth or bouillon, black coffee or tea, clear juice, clear soft drinks or sports drinks, gelatin dessert, and popsicles. Avoid any liquids that contain red or purple dye.  On the day of the procedure - do not eat or drink anything during the 2 hours before the procedure, or within the time period that your health care provider recommends. Bowel prep  If you were prescribed an oral bowel prep to clean out your colon:  Take it as told by your health care provider. Starting the day before your procedure, you will need to drink a large amount of medicated liquid. The liquid will cause you to have multiple loose stools until your stool is almost clear or light green.  If your skin or anus gets irritated from diarrhea, you may use these to relieve the irritation:  Medicated wipes, such as adult wet wipes with aloe and vitamin E.  A skin soothing-product like petroleum jelly.  If you vomit while drinking the bowel prep, take a break for up to 60 minutes and then begin the bowel prep again. If vomiting continues and you cannot take the bowel prep without vomiting, call your health care  provider. General instructions   Ask your health care provider about changing or stopping your regular medicines. This is especially important if you are taking diabetes medicines or blood thinners.  Plan to have someone take you home from the hospital or clinic. What happens during the procedure?  An IV tube may be inserted into one of your veins.  You will be given medicine to help you relax (sedative).  To reduce your  risk of infection:  Your health care team will wash or sanitize their hands.  Your anal area will be washed with soap.  You will be asked to lie on your side with your knees bent.  Your health care provider will lubricate a long, thin, flexible tube. The tube will have a camera and a light on the end.  The tube will be inserted into your anus.  The tube will be gently eased through your rectum and colon.  Air will be delivered into your colon to keep it open. You may feel some pressure or cramping.  The camera will be used to take images during the procedure.  A small tissue sample may be removed from your body to be examined under a microscope (biopsy). If any potential problems are found, the tissue will be sent to a lab for testing.  If small polyps are found, your health care provider may remove them and have them checked for cancer cells.  The tube that was inserted into your anus will be slowly removed. The procedure may vary among health care providers and hospitals. What happens after the procedure?  Your blood pressure, heart rate, breathing rate, and blood oxygen level will be monitored until the medicines you were given have worn off.  Do not drive for 24 hours after the exam.  You may have a small amount of blood in your stool.  You may pass gas and have mild abdominal cramping or bloating due to the air that was used to inflate your colon during the exam.  It is up to you to get the results of your procedure. Ask your health care provider,  or the department performing the procedure, when your results will be ready. This information is not intended to replace advice given to you by your health care provider. Make sure you discuss any questions you have with your health care provider. Document Released: 08/25/2000 Document Revised: 06/28/2016 Document Reviewed: 11/09/2015 Elsevier Interactive Patient Education  2017 [employer]  Colonoscopy, Adult, Care After This sheet gives you information about how to care for yourself after your procedure. Your health care provider may also give you more specific instructions. If you have problems or questions, contact your health care provider. What can I expect after the procedure? After the procedure, it is common to have:  A small amount of blood in your stool for 24 hours after the procedure.  Some gas.  Mild abdominal cramping or bloating. Follow these instructions at home: General instructions    For the first 24 hours after the procedure:  Do not drive or use machinery.  Do not sign important documents.  Do not drink alcohol.  Do your regular daily activities at a slower pace than normal.  Eat soft, easy-to-digest foods.  Rest often.  Take over-the-counter or prescription medicines only as told by your health care provider.  It is up to you to get the results of your procedure. Ask your health care provider, or the department performing the procedure, when your results will be ready. Relieving cramping and bloating   Try walking around when you have cramps or feel bloated.  Apply heat to your abdomen as told by your health care provider. Use a heat source that your health care provider recommends, such as a moist heat pack or a heating pad.  Place a towel between your skin and the heat source.  Leave the heat on for 20-30 minutes.  Remove the heat if your skin turns  bright red. This is especially important if you are unable to feel pain, heat, or cold. You may  have a greater risk of getting burned. Eating and drinking   Drink enough fluid to keep your urine clear or pale yellow.  Resume your normal diet as instructed by your health care provider. Avoid heavy or fried foods that are hard to digest.  Avoid drinking alcohol for as long as instructed by your health care provider. Contact a health care provider if:  You have blood in your stool 2-3 days after the procedure. Get help right away if:  You have more than a small spotting of blood in your stool.  You pass large blood clots in your stool.  Your abdomen is swollen.  You have nausea or vomiting.  You have a fever.  You have increasing abdominal pain that is not relieved with medicine. This information is not intended to replace advice given to you by your health care provider. Make sure you discuss any questions you have with your health care provider. Document Released: 04/11/2004 Document Revised: 05/22/2016 Document Reviewed: 11/09/2015 Elsevier Interactive Patient Education  2017 [place] Anesthesia is a term that refers to techniques, procedures, and medicines that help a person stay safe and comfortable during a medical procedure. Monitored anesthesia care, or sedation, is one type of anesthesia. Your anesthesia specialist may recommend sedation if you will be having a procedure that does not require you to be unconscious, such as:  Cataract surgery.  A dental procedure.  A biopsy.  A colonoscopy. During the procedure, you may receive a medicine to help you relax (sedative). There are three levels of sedation:  Mild sedation. At this level, you may feel awake and relaxed. You will be able to follow directions.  Moderate sedation. At this level, you will be sleepy. You may not remember the procedure.  Deep sedation. At this level, you will be asleep. You will not remember the procedure. The more medicine you are given, the deeper your  level of sedation will be. Depending on how you respond to the procedure, the anesthesia specialist may change your level of sedation or the type of anesthesia to fit your needs. An anesthesia specialist will monitor you closely during the procedure. Let your health care provider know about:  Any allergies you have.  All medicines you are taking, including vitamins, herbs, eye drops, creams, and over-the-counter medicines.  Any use of steroids (by mouth or as a cream).  Any problems you or family members have had with sedatives and anesthetic medicines.  Any blood disorders you have.  Any surgeries you have had.  Any medical conditions you have, such as sleep apnea.  Whether you are pregnant or may be pregnant.  Any use of cigarettes, alcohol, or street drugs. What are the risks? Generally, this is a safe procedure. However, problems may occur, including:  Getting too much medicine (oversedation).  Nausea.  Allergic reaction to medicines.  Trouble breathing. If this happens, a breathing tube may be used to help with breathing. It will be removed when you are awake and breathing on your own.  Heart trouble.  Lung trouble. Before the procedure Staying hydrated  Follow instructions from your health care provider about hydration, which may include:  Up to 2 hours before the procedure - you may continue to drink clear liquids, such as water, clear fruit juice, black coffee, and plain tea. Eating and drinking restrictions  Follow instructions from your  health care provider about eating and drinking, which may include:  8 hours before the procedure - stop eating heavy meals or foods such as meat, fried foods, or fatty foods.  6 hours before the procedure - stop eating light meals or foods, such as toast or cereal.  6 hours before the procedure - stop drinking milk or drinks that contain milk.  2 hours before the procedure - stop drinking clear liquids. Medicines  Ask your  health care provider about:  Changing or stopping your regular medicines. This is especially important if you are taking diabetes medicines or blood thinners.  Taking medicines such as aspirin and ibuprofen. These medicines can thin your blood. Do not take these medicines before your procedure if your health care provider instructs you not to. Tests and exams  You will have a physical exam.  You may have blood tests done to show:  How well your kidneys and liver are working.  How well your blood can clot.  General instructions  Plan to have someone take you home from the hospital or clinic.  If you will be going home right after the procedure, plan to have someone with you for 24 hours. What happens during the procedure?  Your blood pressure, heart rate, breathing, level of pain and overall condition will be monitored.  An IV tube will be inserted into one of your veins.  Your anesthesia specialist will give you medicines as needed to keep you comfortable during the procedure. This may mean changing the level of sedation.  The procedure will be performed. After the procedure  Your blood pressure, heart rate, breathing rate, and blood oxygen level will be monitored until the medicines you were given have worn off.  Do not drive for 24 hours if you received a sedative.  You may:  Feel sleepy, clumsy, or nauseous.  Feel forgetful about what happened after the procedure.  Have a sore throat if you had a breathing tube during the procedure.  Vomit. This information is not intended to replace advice given to you by your health care provider. Make sure you discuss any questions you have with your health care provider. Document Released: 05/24/2005 Document Revised: 02/04/2016 Document Reviewed: 12/19/2015 Elsevier Interactive Patient Education  2017 [place], Care After These instructions provide you with information about caring for yourself  after your procedure. Your health care provider may also give you more specific instructions. Your treatment has been planned according to current medical practices, but problems sometimes occur. Call your health care provider if you have any problems or questions after your procedure. What can I expect after the procedure? After your procedure, it is common to:  Feel sleepy for several hours.  Feel clumsy and have poor balance for several hours.  Feel forgetful about what happened after the procedure.  Have poor judgment for several hours.  Feel nauseous or vomit.  Have a sore throat if you had a breathing tube during the procedure. Follow these instructions at home: For at least 24 hours after the procedure:    Do not:  Participate in activities in which you could fall or become injured.  Drive.  Use heavy machinery.  Drink alcohol.  Take sleeping pills or medicines that cause drowsiness.  Make important decisions or sign legal documents.  Take care of children on your own.  Rest. Eating and drinking   Follow the diet that is recommended by your health care provider.  If you vomit, drink  water, juice, or soup when you can drink without vomiting.  Make sure you have little or no nausea before eating solid foods. General instructions   Have a responsible adult stay with you until you are awake and alert.  Take over-the-counter and prescription medicines only as told by your health care provider.  If you smoke, do not smoke without supervision.  Keep all follow-up visits as told by your health care provider. This is important. Contact a health care provider if:  You keep feeling nauseous or you keep vomiting.  You feel light-headed.  You develop a rash.  You have a fever. Get help right away if:  You have trouble breathing. This information is not intended to replace advice given to you by your health care provider. Make sure you discuss any questions  you have with your health care provider. Document Released: 12/19/2015 Document Revised: 04/19/2016 Document Reviewed: 12/19/2015 Elsevier Interactive Patient Education  2017 [employer].

## 2016-12-28 ENCOUNTER — Encounter (HOSPITAL_COMMUNITY): Payer: Self-pay

## 2016-12-28 ENCOUNTER — Telehealth: Payer: Self-pay | Admitting: Nurse Practitioner

## 2016-12-28 ENCOUNTER — Encounter (HOSPITAL_COMMUNITY)
Admission: RE | Admit: 2016-12-28 | Discharge: 2016-12-28 | Disposition: A | Discharge status: Home / Self Care | Payer: Medicaid Other | Source: Ambulatory Visit | Attending: Gastroenterology | Admitting: Gastroenterology

## 2016-12-28 DIAGNOSIS — R1310 Dysphagia, unspecified: Secondary | ICD-10-CM | POA: Diagnosis not present

## 2016-12-28 DIAGNOSIS — Z01818 Encounter for other preprocedural examination: Secondary | ICD-10-CM | POA: Insufficient documentation

## 2016-12-28 HISTORY — DX: Hypothyroidism, unspecified: E03.9

## 2016-12-28 HISTORY — DX: Furuncle, unspecified: L02.92

## 2016-12-28 HISTORY — DX: Carbuncle, unspecified: L02.93

## 2016-12-28 HISTORY — DX: Anemia, unspecified: D64.9

## 2016-12-28 LAB — BASIC METABOLIC PANEL
ANION GAP: 11 (ref 5–15)
BUN: 8 mg/dL (ref 6–20)
CALCIUM: 10.2 mg/dL (ref 8.9–10.3)
CO2: 24 mmol/L (ref 22–32)
Chloride: 96 mmol/L — ABNORMAL LOW (ref 101–111)
Creatinine, Ser: 0.92 mg/dL (ref 0.44–1.00)
Glucose, Bld: 562 mg/dL (ref 65–99)
Potassium: 4.5 mmol/L (ref 3.5–5.1)
Sodium: 131 mmol/L — ABNORMAL LOW (ref 135–145)

## 2016-12-28 LAB — CBC WITH DIFFERENTIAL/PLATELET
BASOS ABS: 0 10*3/uL (ref 0.0–0.1)
Basophils Relative: 0 %
EOS PCT: 6 %
Eosinophils Absolute: 0.6 10*3/uL (ref 0.0–0.7)
HEMATOCRIT: 41.3 % (ref 36.0–46.0)
HEMOGLOBIN: 13.5 g/dL (ref 12.0–15.0)
LYMPHS ABS: 1.7 10*3/uL (ref 0.7–4.0)
LYMPHS PCT: 16 %
MCH: 30 pg (ref 26.0–34.0)
MCHC: 32.7 g/dL (ref 30.0–36.0)
MCV: 91.8 fL (ref 78.0–100.0)
Monocytes Absolute: 0.8 10*3/uL (ref 0.1–1.0)
Monocytes Relative: 8 %
NEUTROS ABS: 7.4 10*3/uL (ref 1.7–7.7)
NEUTROS PCT: 71 %
PLATELETS: 319 10*3/uL (ref 150–400)
RBC: 4.5 MIL/uL (ref 3.87–5.11)
RDW: 12.8 % (ref 11.5–15.5)
WBC: 10.5 10*3/uL (ref 4.0–10.5)

## 2016-12-28 LAB — SURGICAL PCR SCREEN
MRSA, PCR: NEGATIVE
STAPHYLOCOCCUS AUREUS: NEGATIVE

## 2016-12-28 NOTE — Telephone Encounter (Signed)
Received a call from Endo with preop lab result of CBG 562.  Please notify patient's PCP that DM likely not well controlled.  Will cc Dr. Oneida Alar to see how she would like to proceed with procedure scheduled for Tuesday 01/02/17 (5 days from now) on propofol with MAC.

## 2016-12-28 NOTE — Pre-Procedure Instructions (Signed)
Patient in for PAT visit. States she was in ED on 12/22/2016 here for draining boil on her lower back. She was placed on Septra DS which she will complete on 01/01/2017. She states she is changing the dressing BID and it is still draining thick, yellow liquid. She is afebrile. She states that Dr Oneida Alar is not aware of this. Upon looking at chart, ED note shows patient has Staph infection. Called Dr Oneida Alar office to let her know of above. She will call me back.

## 2016-12-28 NOTE — Pre-Procedure Instructions (Signed)
Dr Oneida Alar calls back and states it is ok to proceed with procedure.

## 2016-12-28 NOTE — Pre-Procedure Instructions (Signed)
Lab calls with critical glucose of 562. Dr Oneida Alar office called and spoke with Sonya James. He will let Dr Nona Dell know about glucose.

## 2016-12-29 NOTE — Telephone Encounter (Signed)
Called patient TO DISCUSS RESULTS. WENT TO [place] & DRANK 3 CUPS OF SWEET TEA. FORGOT TO TAKE HER SHOT YESTERDAY. FSBG THIS AM > 300. EXPLAINED TO PT SHE SHOULD FOLLOW A DIABETIC DIET OR SHE IS GOING TO DIE BEFORE SHE IS 60. CAN HAVE TCS TUES. PT VOICED HER UNDERSTANDING.

## 2016-12-29 NOTE — Progress Notes (Signed)
Dr Eden Lathe notified of CBG 562.  Will continue with procedure.

## 2016-12-29 NOTE — Telephone Encounter (Signed)
I tried to call PCP, Dr. Kevan Ny. Got recorder. I am faxing over a letter to him and I also informed pt that she needs to discuss blood sugar with PCP.

## 2017-01-01 ENCOUNTER — Telehealth: Payer: Self-pay

## 2017-01-01 NOTE — Telephone Encounter (Signed)
Tried to call with no answer  

## 2017-01-01 NOTE — Telephone Encounter (Signed)
Noted  

## 2017-01-01 NOTE — OR Nursing (Signed)
Dr. Patsey Berthold informed of glucose 562. Orders received to reckeck  CBG  on arrival.

## 2017-01-02 ENCOUNTER — Encounter (HOSPITAL_COMMUNITY)
Admission: RE | Disposition: A | Discharge status: Home / Self Care | Payer: Self-pay | Source: Ambulatory Visit | Attending: Gastroenterology

## 2017-01-02 ENCOUNTER — Ambulatory Visit (HOSPITAL_COMMUNITY): Payer: Medicaid Other | Admitting: Anesthesiology

## 2017-01-02 ENCOUNTER — Ambulatory Visit (HOSPITAL_COMMUNITY)
Admission: RE | Admit: 2017-01-02 | Discharge: 2017-01-02 | Disposition: A | Discharge status: Home / Self Care | Payer: Medicaid Other | Source: Ambulatory Visit | Attending: Gastroenterology | Admitting: Gastroenterology

## 2017-01-02 ENCOUNTER — Encounter (HOSPITAL_COMMUNITY): Payer: Self-pay

## 2017-01-02 DIAGNOSIS — Q394 Esophageal web: Secondary | ICD-10-CM | POA: Diagnosis not present

## 2017-01-02 DIAGNOSIS — I10 Essential (primary) hypertension: Secondary | ICD-10-CM | POA: Insufficient documentation

## 2017-01-02 DIAGNOSIS — Z1211 Encounter for screening for malignant neoplasm of colon: Secondary | ICD-10-CM

## 2017-01-02 DIAGNOSIS — Z7951 Long term (current) use of inhaled steroids: Secondary | ICD-10-CM | POA: Diagnosis not present

## 2017-01-02 DIAGNOSIS — J449 Chronic obstructive pulmonary disease, unspecified: Secondary | ICD-10-CM | POA: Diagnosis not present

## 2017-01-02 DIAGNOSIS — K635 Polyp of colon: Secondary | ICD-10-CM | POA: Diagnosis not present

## 2017-01-02 DIAGNOSIS — E7800 Pure hypercholesterolemia, unspecified: Secondary | ICD-10-CM | POA: Diagnosis not present

## 2017-01-02 DIAGNOSIS — Z6841 Body Mass Index (BMI) 40.0 and over, adult: Secondary | ICD-10-CM | POA: Insufficient documentation

## 2017-01-02 DIAGNOSIS — K644 Residual hemorrhoidal skin tags: Secondary | ICD-10-CM | POA: Diagnosis not present

## 2017-01-02 DIAGNOSIS — F419 Anxiety disorder, unspecified: Secondary | ICD-10-CM | POA: Insufficient documentation

## 2017-01-02 DIAGNOSIS — K589 Irritable bowel syndrome without diarrhea: Secondary | ICD-10-CM | POA: Diagnosis not present

## 2017-01-02 DIAGNOSIS — Q438 Other specified congenital malformations of intestine: Secondary | ICD-10-CM | POA: Diagnosis not present

## 2017-01-02 DIAGNOSIS — M1990 Unspecified osteoarthritis, unspecified site: Secondary | ICD-10-CM | POA: Insufficient documentation

## 2017-01-02 DIAGNOSIS — E039 Hypothyroidism, unspecified: Secondary | ICD-10-CM | POA: Diagnosis not present

## 2017-01-02 DIAGNOSIS — Z9851 Tubal ligation status: Secondary | ICD-10-CM | POA: Diagnosis not present

## 2017-01-02 DIAGNOSIS — Z1212 Encounter for screening for malignant neoplasm of rectum: Secondary | ICD-10-CM | POA: Diagnosis not present

## 2017-01-02 DIAGNOSIS — F329 Major depressive disorder, single episode, unspecified: Secondary | ICD-10-CM | POA: Insufficient documentation

## 2017-01-02 DIAGNOSIS — G4730 Sleep apnea, unspecified: Secondary | ICD-10-CM | POA: Diagnosis not present

## 2017-01-02 DIAGNOSIS — D125 Benign neoplasm of sigmoid colon: Secondary | ICD-10-CM | POA: Diagnosis not present

## 2017-01-02 DIAGNOSIS — Z794 Long term (current) use of insulin: Secondary | ICD-10-CM | POA: Diagnosis not present

## 2017-01-02 DIAGNOSIS — R1310 Dysphagia, unspecified: Secondary | ICD-10-CM

## 2017-01-02 DIAGNOSIS — E1143 Type 2 diabetes mellitus with diabetic autonomic (poly)neuropathy: Secondary | ICD-10-CM | POA: Insufficient documentation

## 2017-01-02 DIAGNOSIS — Z8542 Personal history of malignant neoplasm of other parts of uterus: Secondary | ICD-10-CM | POA: Diagnosis not present

## 2017-01-02 DIAGNOSIS — K2970 Gastritis, unspecified, without bleeding: Secondary | ICD-10-CM | POA: Diagnosis not present

## 2017-01-02 DIAGNOSIS — K222 Esophageal obstruction: Secondary | ICD-10-CM | POA: Diagnosis not present

## 2017-01-02 DIAGNOSIS — Z79899 Other long term (current) drug therapy: Secondary | ICD-10-CM | POA: Insufficient documentation

## 2017-01-02 DIAGNOSIS — F17210 Nicotine dependence, cigarettes, uncomplicated: Secondary | ICD-10-CM | POA: Diagnosis not present

## 2017-01-02 DIAGNOSIS — K219 Gastro-esophageal reflux disease without esophagitis: Secondary | ICD-10-CM | POA: Insufficient documentation

## 2017-01-02 DIAGNOSIS — K2950 Unspecified chronic gastritis without bleeding: Secondary | ICD-10-CM | POA: Insufficient documentation

## 2017-01-02 HISTORY — PX: SAVORY DILATION: SHX5439

## 2017-01-02 HISTORY — PX: COLONOSCOPY WITH PROPOFOL: SHX5780

## 2017-01-02 HISTORY — PX: BIOPSY: SHX5522

## 2017-01-02 HISTORY — PX: POLYPECTOMY: SHX5525

## 2017-01-02 HISTORY — PX: ESOPHAGOGASTRODUODENOSCOPY (EGD) WITH PROPOFOL: SHX5813

## 2017-01-02 LAB — GLUCOSE, CAPILLARY
GLUCOSE-CAPILLARY: 181 mg/dL — AB (ref 65–99)
GLUCOSE-CAPILLARY: 153 mg/dL — AB (ref 65–99)

## 2017-01-02 SURGERY — COLONOSCOPY WITH PROPOFOL
Anesthesia: Monitor Anesthesia Care

## 2017-01-02 MED ORDER — LIDOCAINE VISCOUS 2 % MT SOLN
OROMUCOSAL | Status: AC
  Filled 2017-01-02: qty 15

## 2017-01-02 MED ORDER — LACTATED RINGERS IV SOLN
INTRAVENOUS | Status: DC
  Administered 2017-01-02: 12:00:00 via INTRAVENOUS

## 2017-01-02 MED ORDER — MIDAZOLAM HCL 2 MG/2ML IJ SOLN
1.0000 mg | INTRAMUSCULAR | Status: AC
  Administered 2017-01-02: 2 mg via INTRAVENOUS

## 2017-01-02 MED ORDER — MIDAZOLAM HCL 2 MG/2ML IJ SOLN
INTRAMUSCULAR | Status: AC
  Filled 2017-01-02: qty 2

## 2017-01-02 MED ORDER — CHLORHEXIDINE GLUCONATE CLOTH 2 % EX PADS: 6.0000 | MEDICATED_PAD | Freq: Once | CUTANEOUS | Status: DC

## 2017-01-02 MED ORDER — MINERAL OIL PO OIL
TOPICAL_OIL | ORAL | Status: AC
  Filled 2017-01-02: qty 30

## 2017-01-02 MED ORDER — ESOMEPRAZOLE MAGNESIUM 40 MG PO CPDR: 40.0000 mg | DELAYED_RELEASE_CAPSULE | Freq: Two times a day (BID) | ORAL | 11 refills | Status: DC

## 2017-01-02 MED ORDER — FENTANYL CITRATE (PF) 100 MCG/2ML IJ SOLN
INTRAMUSCULAR | Status: AC
Start: 2017-01-02 — End: ?
  Filled 2017-01-02: qty 2

## 2017-01-02 MED ORDER — PROPOFOL 10 MG/ML IV BOLUS
INTRAVENOUS | Status: AC
  Filled 2017-01-02: qty 40

## 2017-01-02 MED ORDER — FENTANYL CITRATE (PF) 100 MCG/2ML IJ SOLN
25.0000 ug | Freq: Once | INTRAMUSCULAR | Status: AC
  Administered 2017-01-02: 25 ug via INTRAVENOUS

## 2017-01-02 MED ORDER — GLYCOPYRROLATE 0.2 MG/ML IJ SOLN: 0.2000 mg | Freq: Once | INTRAMUSCULAR | Status: DC | PRN

## 2017-01-02 MED ORDER — PROPOFOL 500 MG/50ML IV EMUL
INTRAVENOUS | Status: DC | PRN
  Administered 2017-01-02: 13:00:00 via INTRAVENOUS
  Administered 2017-01-02: 125 ug/kg/min via INTRAVENOUS
  Administered 2017-01-02: 100 ug/kg/min via INTRAVENOUS

## 2017-01-02 MED ORDER — LIDOCAINE VISCOUS 2 % MT SOLN
5.0000 mL | Freq: Once | OROMUCOSAL | Status: AC
  Administered 2017-01-02: 5 mL via OROMUCOSAL

## 2017-01-02 NOTE — Anesthesia Postprocedure Evaluation (Signed)
Anesthesia Post Note  Patient: Zoei Amison Munch  Procedure(s) Performed: Procedure(s) (LRB): COLONOSCOPY WITH PROPOFOL (N/A) ESOPHAGOGASTRODUODENOSCOPY (EGD) WITH PROPOFOL (N/A) SAVORY DILATION (N/A) POLYPECTOMY BIOPSY  Patient location during evaluation: Short Stay Anesthesia Type: MAC Level of consciousness: awake and alert and oriented Pain management: pain level controlled Vital Signs Assessment: post-procedure vital signs reviewed and stable Respiratory status: spontaneous breathing Cardiovascular status: blood pressure returned to baseline Postop Assessment: no signs of nausea or vomiting     Last Vitals:  Vitals:   01/02/17 1330 01/02/17 1340  BP: 120/78 (!) 142/89  Pulse: 64 65  Resp: 17 18  Temp:  36.7 C    Last Pain:  Vitals:   01/02/17 1340  TempSrc: Oral  PainSc:                  Ulysess Witz

## 2017-01-02 NOTE — Progress Notes (Signed)
Patient states she has multiple boils on back and buttocks.

## 2017-01-02 NOTE — Transfer of Care (Signed)
Immediate Anesthesia Transfer of Care Note  Patient: Sonya James  Procedure(s) Performed: Procedure(s) with comments: COLONOSCOPY WITH PROPOFOL (N/A) - 12:45pm ESOPHAGOGASTRODUODENOSCOPY (EGD) WITH PROPOFOL (N/A) SAVORY DILATION (N/A) POLYPECTOMY - sigmoid colon polyps times 2 BIOPSY - gastric biopsy  Patient Location: PACU  Anesthesia Type:MAC  Level of Consciousness: awake  Airway & Oxygen Therapy: Patient Spontanous Breathing and Patient connected to nasal cannula oxygen  Post-op Assessment: Report given to RN  Post vital signs: Reviewed and stable  Last Vitals:  Vitals:   01/02/17 1215 01/02/17 1316  BP: 130/74 121/64  Pulse:  68  Resp: (!) 23 12  Temp:  36.5 C    Last Pain:  Vitals:   01/02/17 1202  TempSrc: Oral         Complications: No apparent anesthesia complications

## 2017-01-02 NOTE — Op Note (Signed)
[facility] Patient Name: Sonya James Procedure Date: 01/02/2017 12:55 PM MRN: [id number] Date of Birth: [birth year] Attending MD: Barney Drain , MD CSN: [id number] Age: 51 Admit Type: Outpatient Procedure:                Upper GI endoscopy WITH COLD FORCEPS BIOPSY Indications:              Dysphagia, PMHx: GERD Providers:                Barney Drain, MD, Gwenlyn Fudge RN, RN, Aram Candela Referring MD:             Carolann Littler. Dean Medicines:                Propofol per Anesthesia Complications:            No immediate complications. Estimated Blood Loss:     Estimated blood loss was minimal. Procedure:                Pre-Anesthesia Assessment:                           - Prior to the procedure, a History and Physical                            was performed, and patient medications and                            allergies were reviewed. The patient's tolerance of                            previous anesthesia was also reviewed. The risks                            and benefits of the procedure and the sedation                            options and risks were discussed with the patient.                            All questions were answered, and informed consent                            was obtained. Prior Anticoagulants: The patient has                            taken no previous anticoagulant or antiplatelet                            agents. ASA Grade Assessment: III - A patient with                            severe systemic disease. After reviewing the risks                            and benefits, the patient was deemed in  satisfactory condition to undergo the procedure.                            After obtaining informed consent, the endoscope was                            passed under direct vision. Throughout the                            procedure, the patient's blood pressure, pulse, and                            oxygen saturations were  monitored continuously. The                            [id number] ([id number]) scope was introduced through the                            mouth, and advanced to the second part of duodenum.                            The upper GI endoscopy was accomplished without                            difficulty. The patient tolerated the procedure                            well. Scope In: 1:01:00 PM Scope Out: 1:08:03 PM Total Procedure Duration: 0 hours 7 minutes 3 seconds  Findings:      A web was found in the proximal esophagus. A guidewire was placed and       the scope was withdrawn. Dilation was performed with a Savary dilator       with mild resistance at 16 mm and 17 mm.      Diffuse moderate inflammation characterized by congestion (edema) and       erythema was found in the gastric antrum. Biopsies were taken with a       cold forceps for Helicobacter pylori testing.      The examined duodenum was normal. 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 Dilated.                           - MODERATE Gastritis. Biopsied. Moderate Sedation:      Per Anesthesia Care Recommendation:           - Await pathology results. LOSE 50 POUNDS.                           - High fiber diet and low fat diet. TRANSITION TO A                            PLANT BASED DIET. CONSIDER NUTRITION REFERRAL.                           -  Continue present medications. NEXIUM 30 MINS                            PRIOR TO MEALS BID.                           - Return to my office in 4 months.                           - Patient has a contact number available for                            emergencies. The signs and symptoms of potential                            delayed complications were discussed with the                            patient. Return to normal activities tomorrow.                            Written discharge instructions were provided to the                            patient. Procedure  Code(s):        --- Professional ---                           [phone], Esophagogastroduodenoscopy, flexible,                            transoral; with insertion of guide wire followed by                            passage of dilator(s) through esophagus over guide                            wire                           [id number], Esophagogastroduodenoscopy, flexible,                            transoral; with biopsy, single or multiple Diagnosis Code(s):        --- Professional ---                           Q39.4, Esophageal web                           K29.70, Gastritis, unspecified, without bleeding                           R13.10, Dysphagia, unspecified CPT copyright 2016 American Medical Association. All rights reserved. The codes documented in this report are preliminary and upon coder review may  be revised to meet current compliance requirements. Sonya James  Oneida Alar, [place], MD 01/02/2017 1:18:31 PM This report has been signed electronically. Number of Addenda: 0

## 2017-01-02 NOTE — H&P (Signed)
Primary Care Physician:  Rogers Blocker, MD Primary Gastroenterologist:  Dr. Oneida Alar  Pre-Procedure History & Physical: HPI:  Sonya James is a 51 y.o. female here for DYSPHAGIA/screening.  Past Medical History:  Diagnosis Date  . Anemia   . Anxiety   . Asthma   . Autonomic neuropathy   . Constipation   . COPD (chronic obstructive pulmonary disease) ([place])   . CTS (carpal tunnel syndrome)   . Depression   . Diabetes mellitus without complication ([place])   . Endometrial cancer ([place]) 2017  . GERD (gastroesophageal reflux disease)   . Headache(784.0)   . HTN (hypertension)   . Hypercholesterolemia   . Hypothyroidism   . IBS (irritable bowel syndrome)   . Osteoarthritis    osteoarthritis  . Pancreatitis    per patient   . Recurrent boils    buttocks and low back.  . Shortness of breath   . Sleep apnea    CPAP machine  . Uterine cancer ([place]) 2017  . Vitamin D deficiency     Past Surgical History:  Procedure Laterality Date  . ABDOMINAL HYSTERECTOMY     endometrial cancer, surgery done at [facility]   . BREAST REDUCTION SURGERY    . CESAREAN SECTION    . ESOPHAGOGASTRODUODENOSCOPY  05/22/2011   Dr. Oneida Alar: H.pylori gastritis   . FOOT SURGERY Left    tendon repair  . TUBAL LIGATION      Prior to Admission medications   Medication Sig Start Date End Date Taking? Authorizing Provider  acetaminophen (TYLENOL) 325 MG tablet Take 325 mg by mouth 2 (two) times daily as needed for mild pain.  11/05/15  Yes Historical Provider, MD  albuterol (PROVENTIL HFA;VENTOLIN HFA) 108 (90 Base) MCG/ACT inhaler Inhale 2 puffs into the lungs every 6 (six) hours as needed for wheezing or shortness of breath.   Yes Historical Provider, MD  albuterol (PROVENTIL) (2.5 MG/3ML) 0.083% nebulizer solution Take 3 mLs (2.5 mg total) by nebulization every 6 (six) hours as needed for wheezing. 07/05/14  Yes Kathie Dike, MD  esomeprazole (NEXIUM) 40 MG capsule Take 1 capsule (40 mg total) by mouth every  morning. 11/20/16  Yes Ledell Noss, MD  fluconazole (DIFLUCAN) 50 MG tablet Take 50 mg by mouth once as needed.   Yes Historical Provider, MD  furosemide (LASIX) 40 MG tablet Take 1 tablet (40 mg total) by mouth daily. 05/27/13  Yes Kathie Dike, MD  gabapentin (NEURONTIN) 300 MG capsule Take 300 mg by mouth 3 (three) times daily.   Yes Historical Provider, MD  glipiZIDE (GLUCOTROL) 10 MG tablet Take 1 tablet (10 mg total) by mouth daily before breakfast. 07/05/14  Yes Kathie Dike, MD  HYDROcodone-acetaminophen (NORCO/VICODIN) 5-325 MG tablet Take one tab po q 4-6 hrs prn pain Patient taking differently: Take 1 tablet by mouth 2 (two) times daily as needed for moderate pain.  12/22/16  Yes Tammy Triplett, PA-C  hydrocortisone 1 % ointment Apply 1 application topically 2 (two) times daily. 12/13/16  Yes Jonnie Kind, MD  insulin NPH-regular Human (NOVOLIN 70/30) (70-30) 100 UNIT/ML injection Inject 35-45 Units into the skin 3 (three) times daily. 35 units in the morning at breakffast and 35 units in the evening with supper and 45 units at bedtime   Yes Historical Provider, MD  linaclotide (LINZESS) 290 MCG CAPS capsule Take 290 mcg by mouth daily before breakfast.   Yes Historical Provider, MD  lisinopril (PRINIVIL,ZESTRIL) 20 MG tablet Take 1 tablet (20 mg total)  by mouth daily. 07/05/14  Yes Kathie Dike, MD  metFORMIN (GLUCOPHAGE) 1000 MG tablet Take 1 tablet (1,000 mg total) by mouth 2 (two) times daily with a meal. 07/05/14  Yes Kathie Dike, MD  montelukast (SINGULAIR) 10 MG tablet Take 10 mg by mouth at bedtime.    Yes Historical Provider, MD  mupirocin ointment (BACTROBAN) 2 % Apply TID to affected areas for 10 days Patient taking differently: Apply 1 application topically 3 (three) times daily. Apply TID to affected areas for 10 days 12/22/16  Yes Tammy Triplett, PA-C  Na Sulfate-K Sulfate-Mg Sulf (SUPREP BOWEL PREP KIT) 17.5-3.13-1.6 GM/180ML SOLN Take 1 kit by mouth as directed. Patient  taking differently: Take 1 kit by mouth as directed. Will start 12-31-16 12/25/16  Yes Danie Binder, MD  nystatin (MYCOSTATIN) 100000 UNIT/ML suspension Take 5 mLs (500,000 Units total) by mouth 4 (four) times daily. 12/14/16  Yes Annitta Needs, NP  nystatin cream (MYCOSTATIN) Apply 1 application topically 2 (two) times daily. 12/13/16  Yes Jonnie Kind, MD  ondansetron (ZOFRAN ODT) 4 MG disintegrating tablet Take 4 mg by mouth every 8 (eight) hours as needed for nausea or vomiting.  10/11/15  Yes Historical Provider, MD  potassium chloride (K-DUR) 10 MEQ tablet Take 10 mEq by mouth every evening.  07/05/14  Yes Historical Provider, MD  rosuvastatin (CRESTOR) 40 MG tablet Take 1 tablet (40 mg total) by mouth daily. Patient taking differently: Take 40 mg by mouth every evening.  07/05/14  Yes Kathie Dike, MD  sitaGLIPtin (JANUVIA) 100 MG tablet Take 100 mg by mouth daily.   Yes Historical Provider, MD  traMADol (ULTRAM) 50 MG tablet Take 50 mg by mouth every 6 (six) hours as needed for moderate pain.   Yes Historical Provider, MD  cycloSPORINE (RESTASIS) 0.05 % ophthalmic emulsion Place 1 drop into both eyes daily.    Historical Provider, MD    Allergies as of 12/14/2016  . (No Known Allergies)    Family History  Problem Relation Age of Onset  . Diabetes Mother   . Hypertension Mother   . COPD Mother   . Asthma Mother   . Hypertension Sister   . Hyperlipidemia Sister   . Diabetes Sister   . Asthma Brother   . Alcohol abuse Brother   . Asthma Son   . Cancer Maternal Grandmother     lung, throat  . Heart disease Maternal Grandfather   . Hypertension Sister   . Hyperlipidemia Sister   . Colon cancer Neg Hx     Social History   Social History  . Marital status: Married    Spouse name: N/A  . Number of children: 1  . Years of education: N/A   Occupational History  . Not on file.   Social History Main Topics  . Smoking status: Current Every Day Smoker    Packs/day: 0.25     Years: 22.00    Types: Cigarettes  . Smokeless tobacco: Never Used     Comment: 2-3 a day  . Alcohol use No  . Drug use: No  . Sexual activity: Yes    Birth control/ protection: None, Surgical   Other Topics Concern  . Not on file   Social History Narrative   Drinks coffee occasionally, soda 2-3 daily     Review of Systems: See HPI, otherwise negative ROS   Physical Exam: LMP 09/20/2015 (Exact Date)  General:   Alert,  pleasant and cooperative in NAD Head:  Normocephalic  and atraumatic. Neck:  Supple; Lungs:  Clear throughout to auscultation.    Heart:  Regular rate and rhythm. Abdomen:  Soft, nontender and nondistended. Normal bowel sounds, without guarding, and without rebound.   Neurologic:  Alert and  oriented x4;  grossly normal neurologically.  Impression/Plan:     DYSPHAGIA/screening   PLAN:  EGD/DIL/tcs TODAY. DISCUSSED PROCEDURE, BENEFITS, & RISKS: < 1% chance of medication reaction, bleeding, perforation, or rupture of spleen/liver.

## 2017-01-02 NOTE — Discharge Instructions (Addendum)
You had 2 polyps removed. YOU HAVE GASTRITIS. I STRETCHED YOUR ESOPHAGUS DUE YOUR PROBLEMS SWALLOWING. I BIOPSIED YOUR STOMACH.   DRINK WATER TO KEEP YOUR URINE LIGHT YELLOW.  FOLLOW A HIGH FIBER/LOW FAT DIET. AVOID ITEMS THAT CAUSE BLOATING. SEE INFO BELOW.  CONTINUE YOUR WEIGHT LOSS EFFORTS.  WHILE I DO NOT WANT TO ALARM YOU, YOUR BODY MASS INDEX IS OVER 40 WHICH MEANS YOU ARE MORBIDLY OBESE. A BODY MASS INDEX OVER FORTY SHORTENS YOUR LIFE EXPECTANCY BY 10 YEARS. OBESITY TURNS ON CANCER GENES. OBESITY IS ASSOCIATED WITH AN INCREASE RISK FOR ALL CANCERS, INCLUDING ESOPHAGEAL AND COLON CANCER.  YOU SHOULD TRANSITION TO A PLANT BASED DIET-NO MEAT OR DAIRY FOR 6 MOS. LET ME KNOW IF YOU WOULD LIKE A REFERRAL TO NUTRITION.  I RECOMMEND THE BOOK, "PREVENT AND REVERSE HEART DISEASE". CALDWELL ESSELTYN JR., MD. PAGE 120-121 CLEARLY STATE THE RULES AND QUICK AND EASY RECIPES FOR BREAKFAST, LUNCH, AND DINNER ARE AFTER P. 127.  YOUR BIOPSY RESULTS WILL BE AVAILABLE IN MY CHART AFTER APR 27 AND MY OFFICE WILL CONTACT YOU IN 10-14 DAYS WITH YOUR RESULTS.   Follow up in 4 mos.  Next colonoscopy in 5-10 years.   ENDOSCOPY Care After Read the instructions outlined below and refer to this sheet in the next week. These discharge instructions provide you with general information on caring for yourself after you leave the hospital. While your treatment has been planned according to the most current medical practices available, unavoidable complications occasionally occur. If you have any problems or questions after discharge, call DR. Khiara Shuping, [phone].  ACTIVITY  You may resume your regular activity, but move at a slower pace for the next 24 hours.   Take frequent rest periods for the next 24 hours.   Walking will help get rid of the air and reduce the bloated feeling in your belly (abdomen).   No driving for 24 hours (because of the medicine (anesthesia) used during the test).   You may shower.    Do not sign any important legal documents or operate any machinery for 24 hours (because of the anesthesia used during the test).    NUTRITION  Drink plenty of fluids.   You may resume your normal diet as instructed by your doctor.   Begin with a light meal and progress to your normal diet. Heavy or fried foods are harder to digest and may make you feel sick to your stomach (nauseated).   Avoid alcoholic beverages for 24 hours or as instructed.    MEDICATIONS  You may resume your normal medications.   WHAT YOU CAN EXPECT TODAY  Some feelings of bloating in the abdomen.   Passage of more gas than usual.   Spotting of blood in your stool or on the toilet paper  .  IF YOU HAD POLYPS REMOVED DURING THE ENDOSCOPY:  Eat a soft diet IF YOU HAVE NAUSEA, BLOATING, ABDOMINAL PAIN, OR VOMITING.    FINDING OUT THE RESULTS OF YOUR TEST Not all test results are available during your visit. DR. Oneida Alar WILL CALL YOU WITHIN 14 DAYS OF YOUR PROCEDUE WITH YOUR RESULTS. Do not assume everything is normal if you have not heard from DR. Kanchan Gal, CALL HER OFFICE AT [phone].  SEEK IMMEDIATE MEDICAL ATTENTION AND CALL THE OFFICE: [phone] IF:  You have more than a spotting of blood in your stool.   Your belly is swollen (abdominal distention).   You are nauseated or vomiting.   You have a temperature over  101F.   You have abdominal pain or discomfort that is severe or gets worse throughout the day.   High-Fiber Diet A high-fiber diet changes your normal diet to include more whole grains, legumes, fruits, and vegetables. Changes in the diet involve replacing refined carbohydrates with unrefined foods. The calorie level of the diet is essentially unchanged. The Dietary Reference Intake (recommended amount) for adult males is 38 grams per day. For adult females, it is 25 grams per day. Pregnant and lactating women should consume 28 grams of fiber per day. Fiber is the intact part  of a plant that is not broken down during digestion. Functional fiber is fiber that has been isolated from the plant to provide a beneficial effect in the body. PURPOSE  Increase stool bulk.   Ease and regulate bowel movements.   Lower cholesterol.   INDICATIONS THAT YOU NEED MORE FIBER  Constipation and hemorrhoids.   Uncomplicated diverticulosis (intestine condition) and irritable bowel syndrome.   Weight management.   As a protective measure against hardening of the arteries (atherosclerosis), diabetes, and cancer.   GUIDELINES FOR INCREASING FIBER IN THE DIET  Start adding fiber to the diet slowly. A gradual increase of about 5 more grams (2 slices of whole-wheat bread, 2 servings of most fruits or vegetables, or 1 bowl of high-fiber cereal) per day is best. Too rapid an increase in fiber may result in constipation, flatulence, and bloating.   Drink enough water and fluids to keep your urine clear or pale yellow. Water, juice, or caffeine-free drinks are recommended. Not drinking enough fluid may cause constipation.   Eat a variety of high-fiber foods rather than one type of fiber.   Try to increase your intake of fiber through using high-fiber foods rather than fiber pills or supplements that contain small amounts of fiber.   The goal is to change the types of food eaten. Do not supplement your present diet with high-fiber foods, but replace foods in your present diet.   INCLUDE A VARIETY OF FIBER SOURCES  Replace refined and processed grains with whole grains, canned fruits with fresh fruits, and incorporate other fiber sources. White rice, white breads, and most bakery goods contain little or no fiber.   Brown whole-grain rice, buckwheat oats, and many fruits and vegetables are all good sources of fiber. These include: broccoli, Brussels sprouts, cabbage, cauliflower, beets, sweet potatoes, white potatoes (skin on), carrots, tomatoes, eggplant, squash, berries, fresh fruits,  and dried fruits.   Cereals appear to be the richest source of fiber. Cereal fiber is found in whole grains and bran. Bran is the fiber-rich outer coat of cereal grain, which is largely removed in refining. In whole-grain cereals, the bran remains. In breakfast cereals, the largest amount of fiber is found in those with "bran" in their names. The fiber content is sometimes indicated on the label.   You may need to include additional fruits and vegetables each day.   In baking, for 1 cup white flour, you may use the following substitutions:   1 cup whole-wheat flour minus 2 tablespoons.   1/2 cup white flour plus 1/2 cup whole-wheat flour.    Polyps, Colon  A polyp is extra tissue that grows inside your body. Colon polyps grow in the large intestine. The large intestine, also called the colon, is part of your digestive system. It is a long, hollow tube at the end of your digestive tract where your body makes and stores stool. Most polyps are not dangerous.  They are benign. This means they are not cancerous. But over time, some types of polyps can turn into cancer. Polyps that are smaller than a pea are usually not harmful. But larger polyps could someday become or may already be cancerous. To be safe, doctors remove all polyps and test them.   WHO GETS POLYPS? Anyone can get polyps, but certain people are more likely than others. You may have a greater chance of getting polyps if:  You are over 50.   You have had polyps before.   Someone in your family has had polyps.   Someone in your family has had cancer of the large intestine.   Find out if someone in your family has had polyps. You may also be more likely to get polyps if you:   Eat a lot of fatty foods   Smoke   Drink alcohol   Do not exercise  Eat too much    PREVENTION There is not one sure way to prevent polyps. You might be able to lower your risk of getting them if you:  Eat more fruits and vegetables and less fatty  food.   Do not smoke.   Avoid alcohol.   Exercise every day.   Lose weight if you are overweight.   Eating more calcium and folate can also lower your risk of getting polyps. Some foods that are rich in calcium are milk, cheese, and broccoli. Some foods that are rich in folate are chickpeas, kidney beans, and spinach.    Gastritis  Gastritis is an inflammation (the body's way of reacting to injury and/or infection) of the stomach. It is often caused by viral or bacterial (germ) infections. It can also be caused BY ASPIRIN, BC/GOODY POWDER'S, (IBUPROFEN) MOTRIN, OR ALEVE (NAPROXEN), chemicals (including alcohol), SPICY FOODS, and medications. This illness may be associated with generalized malaise (feeling tired, not well), UPPER ABDOMINAL STOMACH cramps, and fever. One common bacterial cause of gastritis is an organism known as H. Pylori. This can be treated with antibiotics.

## 2017-01-02 NOTE — Anesthesia Preprocedure Evaluation (Addendum)
Anesthesia Evaluation  Patient identified by MRN, date of birth, ID band Patient awake    Reviewed: Allergy & Precautions, NPO status , Patient's Chart, lab work & pertinent test results  Airway Mallampati: II  TM Distance: >3 FB     Dental  (+) Teeth Intact   Pulmonary shortness of breath and with exertion, asthma , sleep apnea , COPD, Current Smoker,    breath sounds clear to auscultation       Cardiovascular hypertension, Pt. on medications + DOE   Rhythm:Regular Rate:Normal     Neuro/Psych  Headaches, PSYCHIATRIC DISORDERS Anxiety Depression  Neuromuscular disease    GI/Hepatic GERD  ,  Endo/Other  diabetes, Type 2Hypothyroidism Morbid obesity  Renal/GU      Musculoskeletal   Abdominal   Peds  Hematology   Anesthesia Other Findings   Reproductive/Obstetrics                            Anesthesia Physical Anesthesia Plan  ASA: III  Anesthesia Plan: MAC   Post-op Pain Management:    Induction: Intravenous  Airway Management Planned: Simple Face Mask  Additional Equipment:   Intra-op Plan:   Post-operative Plan:   Informed Consent: I have reviewed the patients History and Physical, chart, labs and discussed the procedure including the risks, benefits and alternatives for the proposed anesthesia with the patient or authorized representative who has indicated his/her understanding and acceptance.     Plan Discussed with:   Anesthesia Plan Comments:         Anesthesia Quick Evaluation

## 2017-01-02 NOTE — Op Note (Signed)
[facility] Patient Name: Sonya James Procedure Date: 01/02/2017 11:52 AM MRN: [id number] Date of Birth: [birth year] Attending MD: Barney Drain , MD CSN: [id number] Age: 51 Admit Type: Outpatient Procedure:                Colonoscopy WITH COLD FORCEPS POLYPECTOMY Indications:              Screening for colorectal malignant neoplasm Providers:                Barney Drain, MD, Otis Peak B. Sharon Seller, RN, Aram Candela Referring MD:             Carolann Littler. Dean Medicines:                Propofol per Anesthesia Complications:            No immediate complications. Estimated Blood Loss:     Estimated blood loss was minimal. Procedure:                Pre-Anesthesia Assessment:                           - Prior to the procedure, a History and Physical                            was performed, and patient medications and                            allergies were reviewed. The patient's tolerance of                            previous anesthesia was also reviewed. The risks                            and benefits of the procedure and the sedation                            options and risks were discussed with the patient.                            All questions were answered, and informed consent                            was obtained. Prior Anticoagulants: The patient has                            taken no previous anticoagulant or antiplatelet                            agents. ASA Grade Assessment: III - A patient with                            severe systemic disease. After reviewing the risks  and benefits, the patient was deemed in                            satisfactory condition to undergo the procedure.                           After obtaining informed consent, the colonoscope                            was passed under direct vision. Throughout the                            procedure, the patient's blood pressure, pulse, and                         oxygen saturations were monitored continuously. The                            EC-[id number] ([id number]) scope was introduced through                            the anus and advanced to the the cecum, identified                            by appendiceal orifice and ileocecal valve. The                            colonoscopy was technically difficult and complex                            due to poor endoscopic visualization and a tortuous                            colon. Successful completion of the procedure was                            aided by lavage and COLOWRAP. The ileocecal valve,                            appendiceal orifice, and rectum were photographed.                            The patient tolerated the procedure well. The                            quality of the bowel preparation was good. Scope In: 12:29:40 PM Scope Out: 12:52:16 PM Scope Withdrawal Time: 0 hours 19 minutes 42 seconds  Total Procedure Duration: 0 hours 22 minutes 36 seconds  Findings:      The recto-sigmoid colon and sigmoid colon were moderately redundant.      Two sessile polyps were found in the sigmoid colon. The polyps were 2 to       3 mm in size. These polyps were removed with a cold biopsy forceps.       Resection and  retrieval were complete.      External hemorrhoids were found during retroflexion. The hemorrhoids       were moderate. Impression:               - Redundant colon.                           - Two 2 to 3 mm polyps in the sigmoid colon,                            removed with a cold biopsy forceps. Resected and                            retrieved.                           - External hemorrhoids. Moderate Sedation:      Per Anesthesia Care Recommendation:           - High fiber diet and low fat diet.                           - Continue present medications.                           - Await pathology results.                           - Repeat colonoscopy in  5-10 years for surveillance.                           - Return to GI office in 4 months.                           - Patient has a contact number available for                            emergencies. The signs and symptoms of potential                            delayed complications were discussed with the                            patient. Return to normal activities tomorrow.                            Written discharge instructions were provided to the                            patient. Procedure Code(s):        --- Professional ---                           [phone], Colonoscopy, flexible; with biopsy, single                            or multiple Diagnosis Code(s):        ---  Professional ---                           Z12.11, Encounter for screening for malignant                            neoplasm of colon                           D12.5, Benign neoplasm of sigmoid colon                           K64.4, Residual hemorrhoidal skin tags                           Q43.8, Other specified congenital malformations of                            intestine CPT copyright 2016 [employer]. All rights reserved. The codes documented in this report are preliminary and upon coder review may  be revised to meet current compliance requirements. Barney Drain, MD Barney Drain, MD 01/02/2017 1:09:09 PM This report has been signed electronically. Number of Addenda: 0

## 2017-01-04 ENCOUNTER — Encounter (HOSPITAL_COMMUNITY): Payer: Self-pay | Admitting: Gastroenterology

## 2017-01-20 ENCOUNTER — Telehealth: Payer: Self-pay | Admitting: Gastroenterology

## 2017-01-20 NOTE — Telephone Encounter (Signed)
Please call pt. She had HYPERPLASTIC POLYPS removed. Please call pt. HER stomach Bx shows gastritis.   DRINK WATER TO KEEP YOUR URINE LIGHT YELLOW.  FOLLOW A HIGH FIBER/LOW FAT DIET. AVOID ITEMS THAT CAUSE BLOATING. TRANSITION TO A PLANT BASED DIET-NO MEAT OR DAIRY FOR 6 MOS.   Follow up in 4 mos E30 DYSPHAGIA/GERD.

## 2017-01-22 NOTE — Telephone Encounter (Signed)
PT is aware.

## 2017-01-22 NOTE — Telephone Encounter (Signed)
Patient scheduled.

## 2017-01-24 ENCOUNTER — Ambulatory Visit (INDEPENDENT_AMBULATORY_CARE_PROVIDER_SITE_OTHER): Payer: Medicaid Other | Admitting: Obstetrics and Gynecology

## 2017-01-24 ENCOUNTER — Encounter: Payer: Self-pay | Admitting: Obstetrics and Gynecology

## 2017-01-24 VITALS — BP 132/84 | HR 70 | Wt 280.0 lb

## 2017-01-24 DIAGNOSIS — B3731 Acute candidiasis of vulva and vagina: Secondary | ICD-10-CM

## 2017-01-24 DIAGNOSIS — B373 Candidiasis of vulva and vagina: Secondary | ICD-10-CM | POA: Diagnosis not present

## 2017-01-24 NOTE — Progress Notes (Signed)
Patient ID: Sonya James, female   DOB: 09-Jan-1966, 51 y.o.   MRN: [id number]    [facility] Visit  @DATE @            Patient name: Sonya James MRN [id number]  Date of birth: 03/29/1966  CC & HPI:   Chief Complaint  Patient presents with   Follow-up    yeast infection     Sonya James is a 51 y.o. female presenting today for f/u for treatment of yeast infection. Pt was seen in the office 12/13/16, was treated for intertrigo, yeast infection and folliculitis with Micolog and Diflucan. Pt states her symptoms have resolved.  Exam more difficult due to power outage at time of visit. ROS:  ROS F/u for yeast infection tx   Pertinent History Reviewed:   Reviewed: Significant for endometrial cancer, abdominal hysterectomy, cesarean section, tubal ligation  Medical         Past Medical History:  Diagnosis Date   Anemia    Anxiety    Asthma    Autonomic neuropathy    Constipation    COPD (chronic obstructive pulmonary disease) (HCC)    CTS (carpal tunnel syndrome)    Depression    Diabetes mellitus without complication ([place])    Endometrial cancer ([place]) 2017   GERD (gastroesophageal reflux disease)    Headache(784.0)    HTN (hypertension)    Hypercholesterolemia    Hypothyroidism    IBS (irritable bowel syndrome)    Osteoarthritis    osteoarthritis   Pancreatitis    per patient    Recurrent boils    buttocks and low back.   Shortness of breath    Sleep apnea    CPAP machine   Uterine cancer ([place]) 2017   Vitamin D deficiency                               Surgical Hx:    Past Surgical History:  Procedure Laterality Date   ABDOMINAL HYSTERECTOMY     endometrial cancer, surgery done at [facility]  01/02/2017   Procedure: BIOPSY;  Surgeon: Danie Binder, MD;  Location: [facility];  Service: Endoscopy;;  gastric biopsy   BREAST REDUCTION SURGERY     CESAREAN SECTION     COLONOSCOPY WITH PROPOFOL N/A 01/02/2017   Procedure: COLONOSCOPY WITH PROPOFOL;  Surgeon: Danie Binder, MD;  Location: [facility];  Service: Endoscopy;  Laterality: N/A;  12:45pm   ESOPHAGOGASTRODUODENOSCOPY  05/22/2011   Dr. Oneida Alar: H.pylori gastritis    ESOPHAGOGASTRODUODENOSCOPY (EGD) WITH PROPOFOL N/A 01/02/2017   Procedure: ESOPHAGOGASTRODUODENOSCOPY (EGD) WITH PROPOFOL;  Surgeon: Danie Binder, MD;  [facility]: [facility];  Service: Endoscopy;  Laterality: N/A;   FOOT SURGERY Left    tendon repair   POLYPECTOMY  01/02/2017   Procedure: POLYPECTOMY;  Surgeon: Danie Binder, MD;  [facility]: [facility];  Service: Endoscopy;;  sigmoid colon polyps times 2   SAVORY DILATION N/A 01/02/2017   Procedure: SAVORY DILATION;  Surgeon: Danie Binder, MD;  Location: [facility];  Service: Endoscopy;  Laterality: N/A;   TUBAL LIGATION     Medications: Reviewed & Updated - see associated section                       Current Outpatient Prescriptions:    acetaminophen (TYLENOL) 325 MG tablet, Take 325 mg  by mouth 2 (two) times daily as needed for mild pain. , Disp: , Rfl:    albuterol (PROVENTIL HFA;VENTOLIN HFA) 108 (90 Base) MCG/ACT inhaler, Inhale 2 puffs into the lungs every 6 (six) hours as needed for wheezing or shortness of breath., Disp: , Rfl:    albuterol (PROVENTIL) (2.5 MG/3ML) 0.083% nebulizer solution, Take 3 mLs (2.5 mg total) by nebulization every 6 (six) hours as needed for wheezing., Disp: 75 mL, Rfl: 12   cycloSPORINE (RESTASIS) 0.05 % ophthalmic emulsion, Place 1 drop into both eyes daily., Disp: , Rfl:    esomeprazole (NEXIUM) 40 MG capsule, Take 1 capsule (40 mg total) by mouth 2 (two) times daily before a meal., Disp: 60 capsule, Rfl: 11   fluconazole (DIFLUCAN) 50 MG tablet, Take 50 mg by mouth once as needed., Disp: , Rfl:    furosemide (LASIX) 40 MG tablet, Take 1 tablet (40 mg total) by mouth daily., Disp: 30 tablet, Rfl: 0   gabapentin (NEURONTIN) 300 MG capsule, Take 300 mg by mouth 3 (three)  times daily., Disp: , Rfl:    glipiZIDE (GLUCOTROL) 10 MG tablet, Take 1 tablet (10 mg total) by mouth daily before breakfast., Disp: 30 tablet, Rfl: 0   HYDROcodone-acetaminophen (NORCO/VICODIN) 5-325 MG tablet, Take one tab po q 4-6 hrs prn pain (Patient taking differently: Take 1 tablet by mouth 2 (two) times daily as needed for moderate pain. ), Disp: 8 tablet, Rfl: 0   hydrocortisone 1 % ointment, Apply 1 application topically 2 (two) times daily., Disp: 30 g, Rfl: prn   insulin NPH-regular Human (NOVOLIN 70/30) (70-30) 100 UNIT/ML injection, Inject 35-45 Units into the skin 3 (three) times daily. 35 units in the morning at breakffast and 35 units in the evening with supper and 45 units at bedtime, Disp: , Rfl:    linaclotide (LINZESS) 290 MCG CAPS capsule, Take 290 mcg by mouth daily before breakfast., Disp: , Rfl:    lisinopril (PRINIVIL,ZESTRIL) 20 MG tablet, Take 1 tablet (20 mg total) by mouth daily., Disp: 30 tablet, Rfl: 0   metFORMIN (GLUCOPHAGE) 1000 MG tablet, Take 1 tablet (1,000 mg total) by mouth 2 (two) times daily with a meal., Disp: 60 tablet, Rfl: 0   montelukast (SINGULAIR) 10 MG tablet, Take 10 mg by mouth at bedtime. , Disp: , Rfl:    mupirocin ointment (BACTROBAN) 2 %, Apply TID to affected areas for 10 days (Patient taking differently: Apply 1 application topically 3 (three) times daily. Apply TID to affected areas for 10 days), Disp: 22 g, Rfl: 0   nystatin (MYCOSTATIN) 100000 UNIT/ML suspension, Take 5 mLs (500,000 Units total) by mouth 4 (four) times daily., Disp: 473 mL, Rfl: 0   nystatin cream (MYCOSTATIN), Apply 1 application topically 2 (two) times daily., Disp: 30 g, Rfl: 1   ondansetron (ZOFRAN ODT) 4 MG disintegrating tablet, Take 4 mg by mouth every 8 (eight) hours as needed for nausea or vomiting. , Disp: , Rfl:    potassium chloride (K-DUR) 10 MEQ tablet, Take 10 mEq by mouth every evening. , Disp: , Rfl:    rosuvastatin (CRESTOR) 40 MG tablet, Take  1 tablet (40 mg total) by mouth daily. (Patient taking differently: Take 40 mg by mouth every evening. ), Disp: 30 tablet, Rfl: 0   sitaGLIPtin (JANUVIA) 100 MG tablet, Take 100 mg by mouth daily., Disp: , Rfl:    traMADol (ULTRAM) 50 MG tablet, Take 50 mg by mouth every 6 (six) hours as needed for moderate pain.,  Disp: , Rfl:    Social History: Reviewed -  reports that she has been smoking Cigarettes.  She has a 5.50 pack-year smoking history. She has never used smokeless tobacco.  Objective Findings:  Vitals: Blood pressure 132/84, pulse 70, weight 280 lb (127 kg), last menstrual period 09/20/2015.  Physical Examination: General appearance - alert, well appearing, and in no distress Mental status - alert, oriented to person, place, and time Abdomen - soft, nontender, nondistended, no masses or organomegaly Pelvic -  VULVA: normal appearing vulva with no masses, tenderness or lesions,  VAGINA: normal appearing vagina with normal color and discharge, no lesions, well healed vaginal cuff    Assessment & Plan:   A:  1. Yeast Infection resolved  2. Weight loss encouraged   P:  1. F/u PRN    By signing my name below, I, Hansel Feinstein, attest that this documentation has been prepared under the direction and in the presence of Jonnie Kind, MD. Electronically Signed: Hansel Feinstein, ED Scribe. 01/24/17. 3:54 PM.  I personally performed the services described in this documentation, which was SCRIBED in my presence. The recorded information has been reviewed and considered accurate. It has been edited as necessary during review. Jonnie Kind, MD

## 2017-01-24 NOTE — Patient Instructions (Signed)

## 2017-02-02 ENCOUNTER — Inpatient Hospital Stay (HOSPITAL_COMMUNITY): Admission: RE | Admit: 2017-02-02 | Payer: Medicaid Other | Source: Ambulatory Visit

## 2017-03-15 ENCOUNTER — Ambulatory Visit: Payer: Medicaid Other | Admitting: Gastroenterology

## 2017-05-01 ENCOUNTER — Other Ambulatory Visit: Payer: Self-pay | Admitting: Obstetrics and Gynecology

## 2017-05-01 NOTE — Telephone Encounter (Signed)
refil nystatin

## 2017-05-08 ENCOUNTER — Ambulatory Visit (INDEPENDENT_AMBULATORY_CARE_PROVIDER_SITE_OTHER): Payer: Medicaid Other

## 2017-05-08 ENCOUNTER — Ambulatory Visit (INDEPENDENT_AMBULATORY_CARE_PROVIDER_SITE_OTHER): Payer: Medicaid Other | Admitting: Orthopaedic Surgery

## 2017-05-08 ENCOUNTER — Encounter: Payer: Self-pay | Admitting: Orthopaedic Surgery

## 2017-05-08 VITALS — BP 128/87 | HR 76 | Temp 97.9°F | Ht 66.5 in | Wt 261.0 lb

## 2017-05-08 DIAGNOSIS — G8929 Other chronic pain: Secondary | ICD-10-CM | POA: Diagnosis not present

## 2017-05-08 DIAGNOSIS — F17210 Nicotine dependence, cigarettes, uncomplicated: Secondary | ICD-10-CM | POA: Diagnosis not present

## 2017-05-08 DIAGNOSIS — M25562 Pain in left knee: Secondary | ICD-10-CM | POA: Diagnosis not present

## 2017-05-08 DIAGNOSIS — M25561 Pain in right knee: Secondary | ICD-10-CM

## 2017-05-08 DIAGNOSIS — G609 Hereditary and idiopathic neuropathy, unspecified: Secondary | ICD-10-CM | POA: Diagnosis not present

## 2017-05-08 MED ORDER — NAPROXEN 500 MG PO TABS: 500.0000 mg | ORAL_TABLET | Freq: Two times a day (BID) | ORAL | 5 refills | Status: DC

## 2017-05-08 NOTE — Progress Notes (Signed)
Subjective:    Patient ID: Sonya James, female    DOB: March 02, 1966, 51 y.o.   MRN: [id number]  HPI She has several year history of knee pain bilaterally, worse on the left, getting worse over the last few months.  She has used a cane for over a year.  She has swelling of both knee, pain and no redness, no giving way, no trauma.  She is on Gabapentin for her diabetic neuropathy.  She has shortness of breath but smokes.  She has tried to cut back on her smoking.  She has used Tylenol, ice, heat for her knees with slight help.   Review of Systems  HENT: Negative for congestion.   Respiratory: Positive for cough and shortness of breath.   Cardiovascular: Positive for leg swelling. Negative for chest pain.  Endocrine: Positive for cold intolerance.  Musculoskeletal: Positive for arthralgias, gait problem and joint swelling.  Allergic/Immunologic: Positive for environmental allergies.   Past Medical History:  Diagnosis Date  . Anemia   . Anxiety   . Asthma   . Autonomic neuropathy   . Constipation   . COPD (chronic obstructive pulmonary disease) ([place])   . CTS (carpal tunnel syndrome)   . Depression   . Diabetes mellitus without complication ([place])   . Endometrial cancer ([place]) 2017  . GERD (gastroesophageal reflux disease)   . Headache(784.0)   . HTN (hypertension)   . Hypercholesterolemia   . Hypothyroidism   . IBS (irritable bowel syndrome)   . Osteoarthritis    osteoarthritis  . Pancreatitis    per patient   . Recurrent boils    buttocks and low back.  . Shortness of breath   . Sleep apnea    CPAP machine  . Uterine cancer ([place]) 2017  . Vitamin D deficiency     Past Surgical History:  Procedure Laterality Date  . ABDOMINAL HYSTERECTOMY     endometrial cancer, surgery done at [facility]   . BIOPSY  01/02/2017   Procedure: BIOPSY;  Surgeon: Danie Binder, MD;  [facility];  Service: Endoscopy;;  gastric biopsy  . BREAST REDUCTION SURGERY    . CESAREAN  SECTION    . COLONOSCOPY WITH PROPOFOL N/A 01/02/2017   Procedure: COLONOSCOPY WITH PROPOFOL;  Surgeon: Danie Binder, MD;  [facility];  Service: Endoscopy;  Laterality: N/A;  12:45pm  . ESOPHAGOGASTRODUODENOSCOPY  05/22/2011   Dr. Oneida Alar: H.pylori gastritis   . ESOPHAGOGASTRODUODENOSCOPY (EGD) WITH PROPOFOL N/A 01/02/2017   Procedure: ESOPHAGOGASTRODUODENOSCOPY (EGD) WITH PROPOFOL;  Surgeon: Danie Binder, MD;  [facility];  Service: Endoscopy;  Laterality: N/A;  . FOOT SURGERY Left    tendon repair  . POLYPECTOMY  01/02/2017   Procedure: POLYPECTOMY;  Surgeon: Danie Binder, MD;  [facility];  Service: Endoscopy;;  sigmoid colon polyps times 2  . SAVORY DILATION N/A 01/02/2017   Procedure: SAVORY DILATION;  Surgeon: Danie Binder, MD;  [facility];  Service: Endoscopy;  Laterality: N/A;  . TUBAL LIGATION      Current Outpatient Prescriptions on File Prior to Visit  Medication Sig Dispense Refill  . albuterol (PROVENTIL HFA;VENTOLIN HFA) 108 (90 Base) MCG/ACT inhaler Inhale 2 puffs into the lungs every 6 (six) hours as needed for wheezing or shortness of breath.    [name] albuterol (PROVENTIL) (2.5 MG/3ML) 0.083% nebulizer solution Take 3 mLs (2.5 mg total) by nebulization every 6 (six) hours as needed for wheezing. 75 mL  12  . cycloSPORINE (RESTASIS) 0.05 % ophthalmic emulsion Place 1 drop into both eyes daily.    [name] esomeprazole (NEXIUM) 40 MG capsule Take 1 capsule (40 mg total) by mouth 2 (two) times daily before a meal. 60 capsule 11  . furosemide (LASIX) 40 MG tablet Take 1 tablet (40 mg total) by mouth daily. 30 tablet 0  . gabapentin (NEURONTIN) 300 MG capsule Take 300 mg by mouth 3 (three) times daily.    [name] lisinopril (PRINIVIL,ZESTRIL) 20 MG tablet Take 1 tablet (20 mg total) by mouth daily. 30 tablet 0  . metFORMIN (GLUCOPHAGE) 1000 MG tablet Take 1 tablet (1,000 mg total) by mouth 2 (two) times daily with a meal. 60 tablet 0  .  potassium chloride (K-DUR) 10 MEQ tablet Take 10 mEq by mouth every evening.     . sitaGLIPtin (JANUVIA) 100 MG tablet Take 100 mg by mouth daily.    [name] acetaminophen (TYLENOL) 325 MG tablet Take 325 mg by mouth 2 (two) times daily as needed for mild pain.     . fluconazole (DIFLUCAN) 50 MG tablet Take 50 mg by mouth once as needed.    [name] glipiZIDE (GLUCOTROL) 10 MG tablet Take 1 tablet (10 mg total) by mouth daily before breakfast. (Patient not taking: Reported on 05/08/2017) 30 tablet 0  . HYDROcodone-acetaminophen (NORCO/VICODIN) 5-325 MG tablet Take one tab po q 4-6 hrs prn pain (Patient not taking: Reported on 05/08/2017) 8 tablet 0  . hydrocortisone 1 % ointment Apply 1 application topically 2 (two) times daily. (Patient not taking: Reported on 05/08/2017) 30 g prn  . insulin NPH-regular Human (NOVOLIN 70/30) (70-30) 100 UNIT/ML injection Inject 35-45 Units into the skin 3 (three) times daily. 35 units in the morning at breakffast and 35 units in the evening with supper and 45 units at bedtime    . linaclotide (LINZESS) 290 MCG CAPS capsule Take 290 mcg by mouth daily before breakfast.    . montelukast (SINGULAIR) 10 MG tablet Take 10 mg by mouth at bedtime.     . mupirocin ointment (BACTROBAN) 2 % Apply TID to affected areas for 10 days (Patient not taking: Reported on 05/08/2017) 22 g 0  . nystatin (MYCOSTATIN) 100000 UNIT/ML suspension Take 5 mLs (500,000 Units total) by mouth 4 (four) times daily. (Patient not taking: Reported on 05/08/2017) 473 mL 0  . nystatin cream (MYCOSTATIN) APPLY  CREAM TOPICALLY TO AFFECTED AREA TWICE DAILY AS DIRECTED (Patient not taking: Reported on 05/08/2017) 30 g 1  . ondansetron (ZOFRAN ODT) 4 MG disintegrating tablet Take 4 mg by mouth every 8 (eight) hours as needed for nausea or vomiting.     . rosuvastatin (CRESTOR) 40 MG tablet Take 1 tablet (40 mg total) by mouth daily. (Patient not taking: Reported on 05/08/2017) 30 tablet 0  . traMADol (ULTRAM) 50 MG tablet Take  50 mg by mouth every 6 (six) hours as needed for moderate pain.     No current facility-administered medications on file prior to visit.     Social History   Social History  . Marital status: Married    Spouse name: N/A  . Number of children: 1  . Years of education: N/A   Occupational History  . Not on file.   Social History Main Topics  . Smoking status: Current Every Day Smoker    Packs/day: 0.25    Years: 22.00    Types: Cigarettes  . Smokeless tobacco: Never Used     Comment: 2-3 a  day  . Alcohol use No  . Drug use: No  . Sexual activity: Yes    Birth control/ protection: None, Surgical   Other Topics Concern  . Not on file   Social History Narrative   Drinks coffee occasionally, soda 2-3 daily     Family History  Problem Relation Age of Onset  . Diabetes Mother   . Hypertension Mother   . COPD Mother   . Asthma Mother   . Hypertension Sister   . Hyperlipidemia Sister   . Diabetes Sister   . Asthma Brother   . Alcohol abuse Brother   . Asthma Son   . Cancer Maternal Grandmother        lung, throat  . Heart disease Maternal Grandfather   . Hypertension Sister   . Hyperlipidemia Sister   . Colon cancer Neg Hx     BP 128/87   Pulse 76   Temp 97.9 F (36.6 C)   Ht 5' 6.5" (1.689 m)   Wt 261 lb (118.4 kg)   LMP 09/20/2015 (Exact Date)   BMI 41.50 kg/m      Objective:   Physical Exam  Constitutional: She is oriented to person, place, and time. She appears well-developed and well-nourished.  HENT:  Head: Normocephalic and atraumatic.  Eyes: Pupils are equal, round, and reactive to light. Conjunctivae and EOM are normal.  Neck: Normal range of motion. Neck supple.  Cardiovascular: Normal rate, regular rhythm and intact distal pulses.   Pulmonary/Chest: Effort normal.  Abdominal: Soft.  Musculoskeletal: She exhibits tenderness (pain both knee with 1+ effusion bilaterally, left knee more painful, ROM both 0-110, both stable, both NV intact.  Limp  left  uses cane.  ).  Neurological: She is alert and oriented to person, place, and time. She displays normal reflexes. No cranial nerve deficit. She exhibits normal muscle tone. Coordination normal.  Skin: Skin is warm and dry.  Psychiatric: She has a normal mood and affect. Her behavior is normal. Judgment and thought content normal.    X-rays were done to both knees, reported separately.      Assessment & Plan:   Encounter Diagnoses  Name Primary?  . Chronic pain of right knee Yes  . Chronic pain of left knee   . Cigarette nicotine dependence without complication   . Hereditary and idiopathic peripheral neuropathy    PROCEDURE NOTE:  The patient requests injections of the left knee , verbal consent was obtained.  The left knee was prepped appropriately after time out was performed.   Sterile technique was observed and injection of 1 cc of Depo-Medrol 40 mg with several cc's of plain xylocaine. Anesthesia was provided by ethyl chloride and a 20-gauge needle was used to inject the knee area. The injection was tolerated well.  A band aid dressing was applied.  The patient was advised to apply ice later today and tomorrow to the injection sight as needed.  PROCEDURE NOTE:  The patient requests injections of the right knee , verbal consent was obtained.  The right knee was prepped appropriately after time out was performed.   Sterile technique was observed and injection of 1 cc of Depo-Medrol 40 mg with several cc's of plain xylocaine. Anesthesia was provided by ethyl chloride and a 20-gauge needle was used to inject the knee area. The injection was tolerated well.  A band aid dressing was applied.  The patient was advised to apply ice later today and tomorrow to the injection sight as needed.  Begin Naprosyn 500 po bid pc.  Precautions discussed.  Return in three weeks.  Call if any problem.  Precautions discussed.   Electronically Signed Sanjuana Kava,  MD 8/28/20182:42 PM

## 2017-05-09 ENCOUNTER — Ambulatory Visit (INDEPENDENT_AMBULATORY_CARE_PROVIDER_SITE_OTHER): Payer: Medicaid Other | Admitting: Gastroenterology

## 2017-05-09 ENCOUNTER — Encounter: Payer: Self-pay | Admitting: Gastroenterology

## 2017-05-09 VITALS — BP 132/93 | HR 71 | Temp 97.7°F | Ht 66.0 in | Wt 259.2 lb

## 2017-05-09 DIAGNOSIS — K219 Gastro-esophageal reflux disease without esophagitis: Secondary | ICD-10-CM | POA: Diagnosis not present

## 2017-05-09 DIAGNOSIS — K5900 Constipation, unspecified: Secondary | ICD-10-CM

## 2017-05-09 MED ORDER — LINACLOTIDE 290 MCG PO CAPS: 290.0000 ug | ORAL_CAPSULE | Freq: Every day | ORAL | 3 refills | Status: DC

## 2017-05-09 MED ORDER — NYSTATIN 100000 UNIT/ML MT SUSP: 5.0000 mL | Freq: Four times a day (QID) | OROMUCOSAL | 0 refills | Status: DC

## 2017-05-09 NOTE — Progress Notes (Signed)
cc'ed to pcp °

## 2017-05-09 NOTE — Assessment & Plan Note (Signed)
Continue Linzess 290 mcg once daily. 6 month return.

## 2017-05-09 NOTE — Progress Notes (Signed)
Referring Provider: Rogers Blocker, MD Primary Care Physician:  Rogers Blocker, MD Primary GI: Dr. Oneida Alar   Chief Complaint  Patient presents with  . Dysphagia  . Diarrhea  . Constipation    HPI:   Sonya James is a 51 y.o. female presenting today with a history of constipation, dysphagia, GERD. 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 Colonoscopy with hyperplastic polyps.    No constipation. Linzess 290 mcg once daily. Was given Nystatin at visit in April due to concern for oral thrush. She is back on antibiotics again and feels like she has thrush again. Gets choked sometimes if goes down the wrong pipe.   Past Medical History:  Diagnosis Date  . Anemia   . Anxiety   . Asthma   . Autonomic neuropathy   . Constipation   . COPD (chronic obstructive pulmonary disease) ([place])   . CTS (carpal tunnel syndrome)   . Depression   . Diabetes mellitus without complication ([place])   . Endometrial cancer ([place]) 2017  . GERD (gastroesophageal reflux disease)   . Headache(784.0)   . HTN (hypertension)   . Hypercholesterolemia   . Hypothyroidism   . IBS (irritable bowel syndrome)   . Osteoarthritis    osteoarthritis  . Pancreatitis    per patient   . Recurrent boils    buttocks and low back.  . Shortness of breath   . Sleep apnea    CPAP machine  . Uterine cancer ([place]) 2017  . Vitamin D deficiency     Past Surgical History:  Procedure Laterality Date  . ABDOMINAL HYSTERECTOMY     endometrial cancer, surgery done at [facility]   . BIOPSY  01/02/2017   Procedure: BIOPSY;  Surgeon: Danie Binder, MD;  [facility];  Service: Endoscopy;;  gastric biopsy  . BREAST REDUCTION SURGERY    . CESAREAN SECTION    . COLONOSCOPY WITH PROPOFOL N/A 01/02/2017   Dr. Oneida Alar: redundant colon, two 2-3 mm polyps in sigmoid colon (hyperplastic)  . ESOPHAGOGASTRODUODENOSCOPY  05/22/2011   Dr. Oneida Alar: H.pylori  gastritis   . ESOPHAGOGASTRODUODENOSCOPY (EGD) WITH PROPOFOL N/A 01/02/2017   Dr. 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 FOOT SURGERY Left    tendon repair  . POLYPECTOMY  01/02/2017   Procedure: POLYPECTOMY;  Surgeon: Danie Binder, MD;  [facility];  Service: Endoscopy;;  sigmoid colon polyps times 2  . SAVORY DILATION N/A 01/02/2017   Procedure: SAVORY DILATION;  Surgeon: Danie Binder, MD;  [facility];  Service: Endoscopy;  Laterality: N/A;  . TUBAL LIGATION      Current Outpatient Prescriptions  Medication Sig Dispense Refill  . acetaminophen (TYLENOL) 325 MG tablet Take 325 mg by mouth 2 (two) times daily as needed for mild pain.     [name] albuterol (PROVENTIL HFA;VENTOLIN HFA) 108 (90 Base) MCG/ACT inhaler Inhale 2 puffs into the lungs every 6 (six) hours as needed for wheezing or shortness of breath.    [name] albuterol (PROVENTIL) (2.5 MG/3ML) 0.083% nebulizer solution Take 3 mLs (2.5 mg total) by nebulization every 6 (six) hours as needed for wheezing. 75 mL 12  . atorvastatin (LIPITOR) 40 MG tablet Take 40 mg by mouth daily.    . cycloSPORINE (RESTASIS) 0.05 % ophthalmic emulsion Place 1 drop into both eyes daily.    [name] esomeprazole (NEXIUM) 40  MG capsule Take 1 capsule (40 mg total) by mouth 2 (two) times daily before a meal. 60 capsule 11  . furosemide (LASIX) 40 MG tablet Take 1 tablet (40 mg total) by mouth daily. 30 tablet 0  . gabapentin (NEURONTIN) 300 MG capsule Take 300 mg by mouth 3 (three) times daily.    [name] HYDROcodone-acetaminophen (NORCO/VICODIN) 5-325 MG tablet Take one tab po q 4-6 hrs prn pain 8 tablet 0  . hydrocortisone 1 % ointment Apply 1 application topically 2 (two) times daily. 30 g prn  . insulin NPH-regular Human (NOVOLIN 70/30) (70-30) 100 UNIT/ML injection Inject 35-45 Units into the skin 3 (three) times daily. 35 units in the morning at breakffast and 35 units in the evening with  supper and 45 units at bedtime    . linaclotide (LINZESS) 290 MCG CAPS capsule Take 290 mcg by mouth daily before breakfast.    . lisinopril (PRINIVIL,ZESTRIL) 20 MG tablet Take 1 tablet (20 mg total) by mouth daily. 30 tablet 0  . metFORMIN (GLUCOPHAGE) 1000 MG tablet Take 1 tablet (1,000 mg total) by mouth 2 (two) times daily with a meal. 60 tablet 0  . montelukast (SINGULAIR) 10 MG tablet Take 10 mg by mouth at bedtime.     . naproxen (NAPROSYN) 500 MG tablet Take 1 tablet (500 mg total) by mouth 2 (two) times daily with a meal. 60 tablet 5  . nystatin (MYCOSTATIN) 100000 UNIT/ML suspension Take 5 mLs (500,000 Units total) by mouth 4 (four) times daily. 473 mL 0  . ondansetron (ZOFRAN ODT) 4 MG disintegrating tablet Take 4 mg by mouth every 8 (eight) hours as needed for nausea or vomiting.     . potassium chloride (K-DUR) 10 MEQ tablet Take 10 mEq by mouth every evening.     . rosuvastatin (CRESTOR) 40 MG tablet Take 1 tablet (40 mg total) by mouth daily. 30 tablet 0  . sitaGLIPtin (JANUVIA) 100 MG tablet Take 100 mg by mouth daily.    . traMADol (ULTRAM) 50 MG tablet Take 50 mg by mouth every 6 (six) hours as needed for moderate pain.    . Vitamin D, Ergocalciferol, (DRISDOL) 50000 units CAPS capsule Take 50,000 Units by mouth every 7 (seven) days.    . fluconazole (DIFLUCAN) 50 MG tablet Take 50 mg by mouth once as needed.    [name] glipiZIDE (GLUCOTROL) 10 MG tablet Take 1 tablet (10 mg total) by mouth daily before breakfast. (Patient not taking: Reported on 05/08/2017) 30 tablet 0  . mupirocin ointment (BACTROBAN) 2 % Apply TID to affected areas for 10 days (Patient not taking: Reported on 05/08/2017) 22 g 0  . nystatin cream (MYCOSTATIN) APPLY  CREAM TOPICALLY TO AFFECTED AREA TWICE DAILY AS DIRECTED (Patient not taking: Reported on 05/08/2017) 30 g 1   No current facility-administered medications for this visit.     Allergies as of 05/09/2017  . (No Known Allergies)    Family History    Problem Relation Age of Onset  . Diabetes Mother   . Hypertension Mother   . COPD Mother   . Asthma Mother   . Hypertension Sister   . Hyperlipidemia Sister   . Diabetes Sister   . Asthma Brother   . Alcohol abuse Brother   . Asthma Son   . Cancer Maternal Grandmother        lung, throat  . Heart disease Maternal Grandfather   . Hypertension Sister   . Hyperlipidemia Sister   . Colon cancer  Neg Hx     Social History   Social History  . Marital status: Married    Spouse name: N/A  . Number of children: 1  . Years of education: N/A   Social History Main Topics  . Smoking status: Current Every Day Smoker    Packs/day: 0.25    Years: 22.00    Types: Cigarettes  . Smokeless tobacco: Never Used     Comment: 2-3 a day  . Alcohol use No  . Drug use: No  . Sexual activity: Yes    Birth control/ protection: None, Surgical   Other Topics Concern  . None   Social History Narrative   Drinks coffee occasionally, soda 2-3 daily     Review of Systems: As mentioned in HPI   Physical Exam: BP (!) 132/93   Pulse 71   Temp 97.7 F (36.5 C) (Oral)   Ht 5\' 6"  (1.676 m)   Wt 259 lb 3.2 oz (117.6 kg)   LMP 09/20/2015 (Exact Date)   BMI 41.84 kg/m  General:   Alert and oriented. No distress noted. Pleasant and cooperative.  Head:  Normocephalic and atraumatic. Eyes:  Conjuctiva clear without scleral icterus. Mouth:  Oral mucosa pink and moist.  No obvious thrush on exam today Abdomen:  +BS, soft, non-tender and non-distended. No rebound or guarding. No HSM or masses noted. Msk:  Symmetrical without gross deformities. Normal posture. Extremities:  Without edema. Neurologic:  Alert and  oriented x4 Psych:  Alert and cooperative. Normal mood and affect.

## 2017-05-09 NOTE — Patient Instructions (Signed)
Continue Nexium twice a day, 30 minutes before breakfast and dinner.  Continue Linzess once daily.  I sent in Nystatin to use swish and swallow 4 times a day, for 2 weeks. Call us if this does not improve, and we will refer you to the appropriate specialist for further evaluation of anything orally going on.

## 2017-05-09 NOTE — Assessment & Plan Note (Signed)
Doing well with Nexium BID. She reports possible thrush recently, has been exposed to antibiotics, requesting more nystatin. Will refill this once: I do not appreciate obvious thrush today. If she continues with issues: referral to ENT vs dermatology.

## 2017-05-10 ENCOUNTER — Encounter: Payer: Self-pay | Admitting: Obstetrics and Gynecology

## 2017-05-10 ENCOUNTER — Ambulatory Visit (INDEPENDENT_AMBULATORY_CARE_PROVIDER_SITE_OTHER): Payer: Medicaid Other | Admitting: Obstetrics and Gynecology

## 2017-05-10 VITALS — BP 142/70 | HR 74 | Ht 66.0 in | Wt 263.0 lb

## 2017-05-10 DIAGNOSIS — B373 Candidiasis of vulva and vagina: Secondary | ICD-10-CM

## 2017-05-10 DIAGNOSIS — B3731 Acute candidiasis of vulva and vagina: Secondary | ICD-10-CM

## 2017-05-10 MED ORDER — FLUCONAZOLE 150 MG PO TABS: 150.0000 mg | ORAL_TABLET | Freq: Once | ORAL | 1 refills | Status: AC

## 2017-05-10 NOTE — Progress Notes (Signed)
Patient ID: Sonya James, female   DOB: 11-24-1965, 51 y.o.   MRN: [id number]  [facility] Visit  05/10/17       Patient name: Sonya James MRN [id number]  Date of birth: 26-Jul-1966  CC & HPI:  Sonya James is a 51 y.o. female presenting today for vaginal itching s/p abx use. She reports that she may have a yeast infection and would like to be evaluated for such. Pt has not tried any medications. Pt denies any other symptoms. She notes that she recently got bilateral knee injections.   ROS:  ROS  +vaginal itching  All systems are negative except as noted in the HPI and PMH.   Pertinent History Reviewed:   Reviewed: Significant for DM, recurrent boils Medical         Past Medical History:  Diagnosis Date  . Anemia   . Anxiety   . Arthritis   . Asthma   . Autonomic neuropathy   . Constipation   . COPD (chronic obstructive pulmonary disease) ([place])   . CTS (carpal tunnel syndrome)   . Depression   . Diabetes mellitus without complication ([place])   . Endometrial cancer ([place]) 2017  . GERD (gastroesophageal reflux disease)   . Headache(784.0)   . HTN (hypertension)   . Hypercholesterolemia   . Hypothyroidism   . IBS (irritable bowel syndrome)   . Osteoarthritis    osteoarthritis  . Pancreatitis    per patient   . Recurrent boils    buttocks and low back.  . Shortness of breath   . Sleep apnea    CPAP machine  . Uterine cancer ([place]) 2017  . Vitamin D deficiency                               Surgical Hx:    Past Surgical History:  Procedure Laterality Date  . ABDOMINAL HYSTERECTOMY     endometrial cancer, surgery done at [facility]   . BIOPSY  01/02/2017   Procedure: BIOPSY;  Surgeon: Danie Binder, MD;  Location: [facility];  Service: Endoscopy;;  gastric biopsy  . BREAST REDUCTION SURGERY    . CESAREAN SECTION    . COLONOSCOPY WITH PROPOFOL N/A 01/02/2017   Dr. Oneida Alar: redundant colon, two 2-3 mm polyps in sigmoid colon (hyperplastic)  .  ESOPHAGOGASTRODUODENOSCOPY  05/22/2011   Dr. Oneida Alar: H.pylori gastritis   . ESOPHAGOGASTRODUODENOSCOPY (EGD) WITH PROPOFOL N/A 01/02/2017   Dr. 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 FOOT SURGERY Left    tendon repair  . POLYPECTOMY  01/02/2017   Procedure: POLYPECTOMY;  Surgeon: Danie Binder, MD;  Location: [facility];  Service: Endoscopy;;  sigmoid colon polyps times 2  . SAVORY DILATION N/A 01/02/2017   Procedure: SAVORY DILATION;  Surgeon: Danie Binder, MD;  Location: [facility];  Service: Endoscopy;  Laterality: N/A;  . TUBAL LIGATION     Medications: Reviewed & Updated - see associated section                       Current Outpatient Prescriptions:  .  albuterol (PROVENTIL HFA;VENTOLIN HFA) 108 (90 Base) MCG/ACT inhaler, Inhale 2 puffs into the lungs every 6 (six) hours as needed for wheezing or shortness of breath., Disp: , Rfl:  .  albuterol (PROVENTIL) (2.5 MG/3ML) 0.083% nebulizer solution, Take 3 mLs (  2.5 mg total) by nebulization every 6 (six) hours as needed for wheezing., Disp: 75 mL, Rfl: 12 .  atorvastatin (LIPITOR) 40 MG tablet, Take 40 mg by mouth daily., Disp: , Rfl:  .  cycloSPORINE (RESTASIS) 0.05 % ophthalmic emulsion, Place 1 drop into both eyes daily., Disp: , Rfl:  .  esomeprazole (NEXIUM) 40 MG capsule, Take 1 capsule (40 mg total) by mouth 2 (two) times daily before a meal., Disp: 60 capsule, Rfl: 11 .  furosemide (LASIX) 40 MG tablet, Take 1 tablet (40 mg total) by mouth daily., Disp: 30 tablet, Rfl: 0 .  gabapentin (NEURONTIN) 300 MG capsule, Take 300 mg by mouth 3 (three) times daily., Disp: , Rfl:  .  HYDROcodone-acetaminophen (NORCO/VICODIN) 5-325 MG tablet, Take one tab po q 4-6 hrs prn pain, Disp: 8 tablet, Rfl: 0 .  hydrocortisone 1 % ointment, Apply 1 application topically 2 (two) times daily., Disp: 30 g, Rfl: prn .  insulin NPH-regular Human (NOVOLIN 70/30) (70-30) 100 UNIT/ML injection,  Inject 35-45 Units into the skin 3 (three) times daily. 35 units in the morning at breakffast and 35 units in the evening with supper and 45 units at bedtime, Disp: , Rfl:  .  linaclotide (LINZESS) 290 MCG CAPS capsule, Take 1 capsule (290 mcg total) by mouth daily before breakfast., Disp: 90 capsule, Rfl: 3 .  lisinopril (PRINIVIL,ZESTRIL) 20 MG tablet, Take 1 tablet (20 mg total) by mouth daily., Disp: 30 tablet, Rfl: 0 .  metFORMIN (GLUCOPHAGE) 1000 MG tablet, Take 1 tablet (1,000 mg total) by mouth 2 (two) times daily with a meal., Disp: 60 tablet, Rfl: 0 .  montelukast (SINGULAIR) 10 MG tablet, Take 10 mg by mouth at bedtime. , Disp: , Rfl:  .  naproxen (NAPROSYN) 500 MG tablet, Take 1 tablet (500 mg total) by mouth 2 (two) times daily with a meal., Disp: 60 tablet, Rfl: 5 .  nystatin (MYCOSTATIN) 100000 UNIT/ML suspension, Take 5 mLs (500,000 Units total) by mouth 4 (four) times daily., Disp: 473 mL, Rfl: 0 .  ondansetron (ZOFRAN ODT) 4 MG disintegrating tablet, Take 4 mg by mouth every 8 (eight) hours as needed for nausea or vomiting. , Disp: , Rfl:  .  potassium chloride (K-DUR) 10 MEQ tablet, Take 10 mEq by mouth every evening. , Disp: , Rfl:  .  rosuvastatin (CRESTOR) 40 MG tablet, Take 1 tablet (40 mg total) by mouth daily., Disp: 30 tablet, Rfl: 0 .  sitaGLIPtin (JANUVIA) 100 MG tablet, Take 100 mg by mouth daily., Disp: , Rfl:  .  traMADol (ULTRAM) 50 MG tablet, Take 50 mg by mouth every 6 (six) hours as needed for moderate pain., Disp: , Rfl:  .  Vitamin D, Ergocalciferol, (DRISDOL) 50000 units CAPS capsule, Take 50,000 Units by mouth every 7 (seven) days., Disp: , Rfl:    Social History: Reviewed -  reports that she has been smoking Cigarettes.  She has a 5.50 pack-year smoking history. She has never used smokeless tobacco.  Objective Findings:  Vitals: Blood pressure (!) 142/70, pulse 74, height 5\' 6"  (1.676 m), weight 263 lb (119.3 kg), last menstrual period 09/20/2015.  Physical  Examination:  Pelvic - normal external genitalia, vulva, vagina, cervix, uterus and adnexa,  VULVA: normal appearing vulva with no masses, tenderness or lesions,  VAGINA: normal appearing vagina with normal color and discharge, no lesions, WET MOUNT done - results: negative for pathogens, normal epithelial cells. No obvious yeast.    Assessment & Plan:   A:  1. Hx of chronic yeast, currently none   P:  1. Diflucan 150 mg  Rx for use at time of antibiotic usage. refil x 1  By signing my name below, I, Soijett Blue, attest that this documentation has been prepared under the direction and in the presence of Jonnie Kind, MD. Electronically Signed: Soijett Blue, ED Scribe. 05/10/17. 11:19 AM.  I personally performed the services described in this documentation, which was SCRIBED in my presence. The recorded information has been reviewed and considered accurate. It has been edited as necessary during review. Jonnie Kind, MD

## 2017-05-29 ENCOUNTER — Ambulatory Visit (INDEPENDENT_AMBULATORY_CARE_PROVIDER_SITE_OTHER): Payer: Medicaid Other | Admitting: Orthopaedic Surgery

## 2017-05-29 ENCOUNTER — Encounter: Payer: Self-pay | Admitting: Orthopaedic Surgery

## 2017-05-29 VITALS — BP 147/97 | HR 61 | Temp 97.4°F | Ht 66.0 in | Wt 264.0 lb

## 2017-05-29 DIAGNOSIS — F17210 Nicotine dependence, cigarettes, uncomplicated: Secondary | ICD-10-CM

## 2017-05-29 DIAGNOSIS — G8929 Other chronic pain: Secondary | ICD-10-CM

## 2017-05-29 DIAGNOSIS — M25561 Pain in right knee: Secondary | ICD-10-CM

## 2017-05-29 DIAGNOSIS — M25562 Pain in left knee: Secondary | ICD-10-CM

## 2017-05-29 NOTE — Progress Notes (Signed)
CC: Both of my knees are hurting. I would like an injection in both knees.  The patient has had chronic pain and tenderness of both knees for some time.  Injections help.  There is no locking or giving way of the knee.  There is no new trauma. There is no redness or signs of infections.  The knees have a mild effusion and some crepitus.  There is no redness or signs of recent trauma.  Right knee ROM is 0-100 and left knee ROM is 0-105.  Impression:  Chronic pain of the both knees  Return:  1 month  PROCEDURE NOTE:  The patient requests injections of both knees, verbal consent was obtained.  The left and right knee were individually prepped appropriately after time out was performed.   Sterile technique was observed and injection of 1 cc of Depo-Medrol 40 mg with several cc's of plain xylocaine. Anesthesia was provided by ethyl chloride and a 20-gauge needle was used to inject each knee area. The injections were tolerated well.  A band aid dressing was applied.  The patient was advised to apply ice later today and tomorrow to the injection sight as needed.  Continue Naprosyn.

## 2017-05-29 NOTE — Patient Instructions (Signed)
Steps to Quit Smoking Smoking tobacco can be bad for your health. It can also affect almost every organ in your body. Smoking puts you and people around you at risk for many serious Akai Dollard-lasting (chronic) diseases. Quitting smoking is hard, but it is one of the best things that you can do for your health. It is never too late to quit. What are the benefits of quitting smoking? When you quit smoking, you lower your risk for getting serious diseases and conditions. They can include:  Lung cancer or lung disease.  Heart disease.  Stroke.  Heart attack.  Not being able to have children (infertility).  Weak bones (osteoporosis) and broken bones (fractures).  If you have coughing, wheezing, and shortness of breath, those symptoms may get better when you quit. You may also get sick less often. If you are pregnant, quitting smoking can help to lower your chances of having a baby of low birth weight. What can I do to help me quit smoking? Talk with your doctor about what can help you quit smoking. Some things you can do (strategies) include:  Quitting smoking totally, instead of slowly cutting back how much you smoke over a period of time.  Going to in-person counseling. You are more likely to quit if you go to many counseling sessions.  Using resources and support systems, such as: ? Online chats with a counselor. ? Phone quitlines. ? Printed self-help materials. ? Support groups or group counseling. ? Text messaging programs. ? Mobile phone apps or applications.  Taking medicines. Some of these medicines may have nicotine in them. If you are pregnant or breastfeeding, do not take any medicines to quit smoking unless your doctor says it is okay. Talk with your doctor about counseling or other things that can help you.  Talk with your doctor about using more than one strategy at the same time, such as taking medicines while you are also going to in-person counseling. This can help make  quitting easier. What things can I do to make it easier to quit? Quitting smoking might feel very hard at first, but there is a lot that you can do to make it easier. Take these steps:  Talk to your family and friends. Ask them to support and encourage you.  Call phone quitlines, reach out to support groups, or work with a counselor.  Ask people who smoke to not smoke around you.  Avoid places that make you want (trigger) to smoke, such as: ? Bars. ? Parties. ? Smoke-break areas at work.  Spend time with people who do not smoke.  Lower the stress in your life. Stress can make you want to smoke. Try these things to help your stress: ? Getting regular exercise. ? Deep-breathing exercises. ? Yoga. ? Meditating. ? Doing a body scan. To do this, close your eyes, focus on one area of your body at a time from head to toe, and notice which parts of your body are tense. Try to relax the muscles in those areas.  Download or buy apps on your mobile phone or tablet that can help you stick to your quit plan. There are many free apps, such as QuitGuide from the CDC (Centers for Disease Control and Prevention). You can find more support from smokefree.gov and other websites.  This information is not intended to replace advice given to you by your health care provider. Make sure you discuss any questions you have with your health care provider. Document Released: 06/24/2009 Document   Revised: 04/25/2016 Document Reviewed: 01/12/2015 Elsevier Interactive Patient Education  2018 Elsevier Inc.  

## 2017-06-26 ENCOUNTER — Encounter: Payer: Self-pay | Admitting: Orthopaedic Surgery

## 2017-06-26 ENCOUNTER — Ambulatory Visit (INDEPENDENT_AMBULATORY_CARE_PROVIDER_SITE_OTHER): Payer: Medicaid Other | Admitting: Orthopaedic Surgery

## 2017-06-26 VITALS — BP 171/96 | HR 62 | Temp 98.2°F | Ht 66.0 in | Wt 266.0 lb

## 2017-06-26 DIAGNOSIS — M25561 Pain in right knee: Secondary | ICD-10-CM | POA: Diagnosis not present

## 2017-06-26 DIAGNOSIS — G8929 Other chronic pain: Secondary | ICD-10-CM

## 2017-06-26 DIAGNOSIS — M25562 Pain in left knee: Secondary | ICD-10-CM

## 2017-06-26 DIAGNOSIS — F17210 Nicotine dependence, cigarettes, uncomplicated: Secondary | ICD-10-CM | POA: Diagnosis not present

## 2017-06-26 MED ORDER — NAPROXEN 500 MG PO TABS: 500.0000 mg | ORAL_TABLET | Freq: Two times a day (BID) | ORAL | 5 refills | Status: DC

## 2017-06-26 NOTE — Progress Notes (Signed)
CC: Both of my knees are hurting. I would like an injection in both knees.  The patient has had chronic pain and tenderness of both knees for some time.  Injections help.  There is no locking or giving way of the knee.  There is no new trauma. There is no redness or signs of infections.  The knees have a mild effusion and some crepitus.  There is no redness or signs of recent trauma.  Right knee ROM is 0-110 and left knee ROM is 0-105.  Impression:  Chronic pain of the both knees  Return:  1 month  PROCEDURE NOTE:  The patient requests injections of both knees, verbal consent was obtained.  The left and right knee were individually prepped appropriately after time out was performed.   Sterile technique was observed and injection of 1 cc of Depo-Medrol 40 mg with several cc's of plain xylocaine. Anesthesia was provided by ethyl chloride and a 20-gauge needle was used to inject each knee area. The injections were tolerated well.  A band aid dressing was applied.  The patient was advised to apply ice later today and tomorrow to the injection sight as needed.   Electronically Signed Sanjuana Kava, MD 10/16/201810:27 AM

## 2017-06-26 NOTE — Patient Instructions (Signed)
Health Risks of Smoking  Smoking cigarettes is very bad for your health. Tobacco smoke has over 200 known poisons in it. It contains the poisonous gases nitrogen oxide and carbon monoxide. There are over 60 chemicals in tobacco smoke that cause cancer.  Smoking is difficult to quit because a chemical in tobacco, called nicotine, causes addiction or dependence. When you smoke and inhale, nicotine is absorbed rapidly into the bloodstream through your lungs. Both inhaled and non-inhaled nicotine may be addictive.  What are the risks of cigarette smoke?  Cigarette smokers have an increased risk of many serious medical problems, including:  · Lung cancer.  · Lung disease, such as pneumonia, bronchitis, and emphysema.  · Chest pain (angina) and heart attack because the heart is not getting enough oxygen.  · Heart disease and peripheral blood vessel disease.  · High blood pressure (hypertension).  · Stroke.  · Oral cancer, including cancer of the lip, mouth, or voice box.  · Bladder cancer.  · Pancreatic cancer.  · Cervical cancer.  · Pregnancy complications, including premature birth.  · Stillbirths and smaller newborn babies, birth defects, and genetic damage to sperm.  · Early menopause.  · Lower estrogen level for women.  · Infertility.  · Facial wrinkles.  · Blindness.  · Increased risk of broken bones (fractures).  · Senile dementia.  · Stomach ulcers and internal bleeding.  · Delayed wound healing and increased risk of complications during surgery.  · Even smoking lightly shortens your life expectancy by several years.    Because of secondhand smoke exposure, children of smokers have an increased risk of the following:  · Sudden infant death syndrome (SIDS).  · Respiratory infections.  · Lung cancer.  · Heart disease.  · Ear infections.    What are the benefits of quitting?  There are many health benefits of quitting smoking. Here are some of them:   · Within days of quitting smoking, your risk of having a heart attack decreases, your blood flow improves, and your lung capacity improves. Blood pressure, pulse rate, and breathing patterns start returning to normal soon after quitting.  · Within months, your lungs may clear up completely.  · Quitting for 10 years reduces your risk of developing lung cancer and heart disease to almost that of a nonsmoker.  · People who quit may see an improvement in their overall quality of life.    How do I quit smoking?  Smoking is an addiction with both physical and psychological effects, and longtime habits can be hard to change. Your health care provider can recommend:  · Programs and community resources, which may include group support, education, or talk therapy.  · Prescription medicines to help reduce cravings.  · Nicotine replacement products, such as patches, gum, and nasal sprays. Use these products only as directed. Do not replace cigarette smoking with electronic cigarettes, which are commonly called e-cigarettes. The safety of e-cigarettes is not known, and some may contain harmful chemicals.  · A combination of two or more of these methods.    Where to find more information:  · American Lung Association: www.lung.org  · American Cancer Society: www.cancer.org  Summary  · Smoking cigarettes is very bad for your health. Cigarette smokers have an increased risk of many serious medical problems, including several cancers, heart disease, and stroke.  · Smoking is an addiction with both physical and psychological effects, and longtime habits can be hard to change.  · By stopping right away, you   can greatly reduce the risk of medical problems for you and your family.  · To help you quit smoking, your health care provider can recommend programs, community resources, prescription medicines, and nicotine replacement products such as patches, gum, and nasal sprays.   This information is not intended to replace advice given to you by your health care provider. Make sure you discuss any questions you have with your health care provider.  Document Released: 10/05/2004 Document Revised: 09/01/2016 Document Reviewed: 09/01/2016  Elsevier Interactive Patient Education © 2017 Elsevier Inc.

## 2017-07-24 ENCOUNTER — Encounter: Payer: Self-pay | Admitting: Orthopaedic Surgery

## 2017-07-24 ENCOUNTER — Ambulatory Visit: Payer: Medicaid Other | Admitting: Orthopaedic Surgery

## 2017-07-24 VITALS — BP 162/98 | HR 64 | Temp 97.2°F | Ht 66.0 in | Wt 267.0 lb

## 2017-07-24 DIAGNOSIS — M25562 Pain in left knee: Secondary | ICD-10-CM

## 2017-07-24 DIAGNOSIS — M25561 Pain in right knee: Secondary | ICD-10-CM | POA: Diagnosis not present

## 2017-07-24 DIAGNOSIS — G8929 Other chronic pain: Secondary | ICD-10-CM | POA: Diagnosis not present

## 2017-07-24 NOTE — Progress Notes (Signed)
CC: Both of my knees are hurting. I would like an injection in both knees.  The patient has had chronic pain and tenderness of both knees for some time.  Injections help.  There is no locking or giving way of the knee.  There is no new trauma. There is no redness or signs of infections.  The knees have a mild effusion and some crepitus.  There is no redness or signs of recent trauma.  Right knee ROM is 0-110 and left knee ROM is 0-105.  Impression:  Chronic pain of the both knees  Return:  1 month  PROCEDURE NOTE:  The patient requests injections of both knees, verbal consent was obtained.  The left and right knee were individually prepped appropriately after time out was performed.   Sterile technique was observed and injection of 1 cc of Depo-Medrol 40 mg with several cc's of plain xylocaine. Anesthesia was provided by ethyl chloride and a 20-gauge needle was used to inject each knee area. The injections were tolerated well.  A band aid dressing was applied.  The patient was advised to apply ice later today and tomorrow to the injection sight as needed.  Electronically Signed Sanjuana Kava, MD 11/13/20189:48 AM

## 2017-07-24 NOTE — Patient Instructions (Signed)
Steps to Quit Smoking Smoking tobacco can be bad for your health. It can also affect almost every organ in your body. Smoking puts you and people around you at risk for many serious Sonya James-lasting (chronic) diseases. Quitting smoking is hard, but it is one of the best things that you can do for your health. It is never too late to quit. What are the benefits of quitting smoking? When you quit smoking, you lower your risk for getting serious diseases and conditions. They can include:  Lung cancer or lung disease.  Heart disease.  Stroke.  Heart attack.  Not being able to have children (infertility).  Weak bones (osteoporosis) and broken bones (fractures).  If you have coughing, wheezing, and shortness of breath, those symptoms may get better when you quit. You may also get sick less often. If you are pregnant, quitting smoking can help to lower your chances of having a baby of low birth weight. What can I do to help me quit smoking? Talk with your doctor about what can help you quit smoking. Some things you can do (strategies) include:  Quitting smoking totally, instead of slowly cutting back how much you smoke over a period of time.  Going to in-person counseling. You are more likely to quit if you go to many counseling sessions.  Using resources and support systems, such as: ? Online chats with a counselor. ? Phone quitlines. ? Printed self-help materials. ? Support groups or group counseling. ? Text messaging programs. ? Mobile phone apps or applications.  Taking medicines. Some of these medicines may have nicotine in them. If you are pregnant or breastfeeding, do not take any medicines to quit smoking unless your doctor says it is okay. Talk with your doctor about counseling or other things that can help you.  Talk with your doctor about using more than one strategy at the same time, such as taking medicines while you are also going to in-person counseling. This can help make  quitting easier. What things can I do to make it easier to quit? Quitting smoking might feel very hard at first, but there is a lot that you can do to make it easier. Take these steps:  Talk to your family and friends. Ask them to support and encourage you.  Call phone quitlines, reach out to support groups, or work with a counselor.  Ask people who smoke to not smoke around you.  Avoid places that make you want (trigger) to smoke, such as: ? Bars. ? Parties. ? Smoke-break areas at work.  Spend time with people who do not smoke.  Lower the stress in your life. Stress can make you want to smoke. Try these things to help your stress: ? Getting regular exercise. ? Deep-breathing exercises. ? Yoga. ? Meditating. ? Doing a body scan. To do this, close your eyes, focus on one area of your body at a time from head to toe, and notice which parts of your body are tense. Try to relax the muscles in those areas.  Download or buy apps on your mobile phone or tablet that can help you stick to your quit plan. There are many free apps, such as QuitGuide from the CDC (Centers for Disease Control and Prevention). You can find more support from smokefree.gov and other websites.  This information is not intended to replace advice given to you by your health care provider. Make sure you discuss any questions you have with your health care provider. Document Released: 06/24/2009 Document   Revised: 04/25/2016 Document Reviewed: 01/12/2015 Elsevier Interactive Patient Education  2018 Elsevier Inc.  

## 2017-08-01 ENCOUNTER — Other Ambulatory Visit (HOSPITAL_COMMUNITY): Payer: Self-pay | Admitting: Internal Medicine

## 2017-08-01 DIAGNOSIS — Z1231 Encounter for screening mammogram for malignant neoplasm of breast: Secondary | ICD-10-CM

## 2017-08-03 ENCOUNTER — Encounter (HOSPITAL_COMMUNITY): Payer: Self-pay | Admitting: Emergency Medicine

## 2017-08-03 ENCOUNTER — Emergency Department (HOSPITAL_COMMUNITY): Payer: Medicaid Other

## 2017-08-03 ENCOUNTER — Inpatient Hospital Stay (HOSPITAL_COMMUNITY)
Admission: EM | Admit: 2017-08-03 | Discharge: 2017-08-06 | DRG: 176 | Disposition: A | Discharge status: Home / Self Care | Payer: Medicaid Other | Attending: Internal Medicine | Admitting: Internal Medicine

## 2017-08-03 ENCOUNTER — Other Ambulatory Visit: Payer: Self-pay

## 2017-08-03 DIAGNOSIS — Z833 Family history of diabetes mellitus: Secondary | ICD-10-CM | POA: Diagnosis not present

## 2017-08-03 DIAGNOSIS — Z825 Family history of asthma and other chronic lower respiratory diseases: Secondary | ICD-10-CM | POA: Diagnosis not present

## 2017-08-03 DIAGNOSIS — E039 Hypothyroidism, unspecified: Secondary | ICD-10-CM | POA: Diagnosis present

## 2017-08-03 DIAGNOSIS — IMO0002 Reserved for concepts with insufficient information to code with codable children: Secondary | ICD-10-CM

## 2017-08-03 DIAGNOSIS — Z86711 Personal history of pulmonary embolism: Secondary | ICD-10-CM

## 2017-08-03 DIAGNOSIS — I2699 Other pulmonary embolism without acute cor pulmonale: Secondary | ICD-10-CM | POA: Diagnosis not present

## 2017-08-03 DIAGNOSIS — Z8349 Family history of other endocrine, nutritional and metabolic diseases: Secondary | ICD-10-CM

## 2017-08-03 DIAGNOSIS — J449 Chronic obstructive pulmonary disease, unspecified: Secondary | ICD-10-CM | POA: Diagnosis present

## 2017-08-03 DIAGNOSIS — E7800 Pure hypercholesterolemia, unspecified: Secondary | ICD-10-CM | POA: Diagnosis present

## 2017-08-03 DIAGNOSIS — E785 Hyperlipidemia, unspecified: Secondary | ICD-10-CM | POA: Diagnosis present

## 2017-08-03 DIAGNOSIS — Z801 Family history of malignant neoplasm of trachea, bronchus and lung: Secondary | ICD-10-CM | POA: Diagnosis not present

## 2017-08-03 DIAGNOSIS — Z791 Long term (current) use of non-steroidal anti-inflammatories (NSAID): Secondary | ICD-10-CM | POA: Diagnosis not present

## 2017-08-03 DIAGNOSIS — L02426 Furuncle of left lower limb: Secondary | ICD-10-CM | POA: Diagnosis present

## 2017-08-03 DIAGNOSIS — Z90710 Acquired absence of both cervix and uterus: Secondary | ICD-10-CM | POA: Diagnosis not present

## 2017-08-03 DIAGNOSIS — E1142 Type 2 diabetes mellitus with diabetic polyneuropathy: Secondary | ICD-10-CM | POA: Diagnosis not present

## 2017-08-03 DIAGNOSIS — Z8249 Family history of ischemic heart disease and other diseases of the circulatory system: Secondary | ICD-10-CM

## 2017-08-03 DIAGNOSIS — K589 Irritable bowel syndrome without diarrhea: Secondary | ICD-10-CM | POA: Diagnosis present

## 2017-08-03 DIAGNOSIS — E876 Hypokalemia: Secondary | ICD-10-CM | POA: Diagnosis present

## 2017-08-03 DIAGNOSIS — E1143 Type 2 diabetes mellitus with diabetic autonomic (poly)neuropathy: Secondary | ICD-10-CM | POA: Diagnosis present

## 2017-08-03 DIAGNOSIS — G4730 Sleep apnea, unspecified: Secondary | ICD-10-CM

## 2017-08-03 DIAGNOSIS — I10 Essential (primary) hypertension: Secondary | ICD-10-CM | POA: Diagnosis present

## 2017-08-03 DIAGNOSIS — Z794 Long term (current) use of insulin: Secondary | ICD-10-CM

## 2017-08-03 DIAGNOSIS — E1140 Type 2 diabetes mellitus with diabetic neuropathy, unspecified: Secondary | ICD-10-CM | POA: Diagnosis present

## 2017-08-03 DIAGNOSIS — Z8542 Personal history of malignant neoplasm of other parts of uterus: Secondary | ICD-10-CM | POA: Diagnosis not present

## 2017-08-03 DIAGNOSIS — K219 Gastro-esophageal reflux disease without esophagitis: Secondary | ICD-10-CM | POA: Diagnosis present

## 2017-08-03 DIAGNOSIS — M479 Spondylosis, unspecified: Secondary | ICD-10-CM | POA: Diagnosis present

## 2017-08-03 DIAGNOSIS — Z6841 Body Mass Index (BMI) 40.0 and over, adult: Secondary | ICD-10-CM

## 2017-08-03 DIAGNOSIS — Z720 Tobacco use: Secondary | ICD-10-CM | POA: Diagnosis present

## 2017-08-03 DIAGNOSIS — F17210 Nicotine dependence, cigarettes, uncomplicated: Secondary | ICD-10-CM | POA: Diagnosis present

## 2017-08-03 DIAGNOSIS — E1165 Type 2 diabetes mellitus with hyperglycemia: Secondary | ICD-10-CM

## 2017-08-03 DIAGNOSIS — Z811 Family history of alcohol abuse and dependence: Secondary | ICD-10-CM

## 2017-08-03 LAB — BASIC METABOLIC PANEL
Anion gap: 10 (ref 5–15)
BUN: 8 mg/dL (ref 6–20)
CALCIUM: 10.7 mg/dL — AB (ref 8.9–10.3)
CO2: 26 mmol/L (ref 22–32)
Chloride: 101 mmol/L (ref 101–111)
Creatinine, Ser: 0.8 mg/dL (ref 0.44–1.00)
GFR calc Af Amer: >60 mL/min (ref >60)
GLUCOSE: 369 mg/dL — AB (ref 65–99)
Potassium: 3.3 mmol/L — ABNORMAL LOW (ref 3.5–5.1)
Sodium: 137 mmol/L (ref 135–145)

## 2017-08-03 LAB — MAGNESIUM: Magnesium: 1.5 mg/dL — ABNORMAL LOW (ref 1.7–2.4)

## 2017-08-03 LAB — CBC WITH DIFFERENTIAL/PLATELET
BASOS ABS: 0 10*3/uL (ref 0.0–0.1)
Basophils Relative: 0 %
Eosinophils Absolute: 0.2 10*3/uL (ref 0.0–0.7)
Eosinophils Relative: 2 %
HCT: 44.1 % (ref 36.0–46.0)
Hemoglobin: 14.6 g/dL (ref 12.0–15.0)
LYMPHS ABS: 2.6 10*3/uL (ref 0.7–4.0)
LYMPHS PCT: 28 %
MCH: 30.9 pg (ref 26.0–34.0)
MCHC: 33.1 g/dL (ref 30.0–36.0)
MCV: 93.4 fL (ref 78.0–100.0)
MONO ABS: 0.5 10*3/uL (ref 0.1–1.0)
Monocytes Relative: 5 %
NEUTROS ABS: 6 10*3/uL (ref 1.7–7.7)
Neutrophils Relative %: 65 %
PLATELETS: 238 10*3/uL (ref 150–400)
RBC: 4.72 MIL/uL (ref 3.87–5.11)
RDW: 12.5 % (ref 11.5–15.5)
WBC: 9.3 10*3/uL (ref 4.0–10.5)

## 2017-08-03 LAB — GLUCOSE, CAPILLARY: Glucose-Capillary: 419 mg/dL — ABNORMAL HIGH (ref 65–99)

## 2017-08-03 LAB — D-DIMER, QUANTITATIVE: D-Dimer, Quant: 2.5 ug/mL-FEU — ABNORMAL HIGH (ref 0.00–0.50)

## 2017-08-03 LAB — CBG MONITORING, ED: GLUCOSE-CAPILLARY: 404 mg/dL — AB (ref 65–99)

## 2017-08-03 MED ORDER — LINAGLIPTIN 5 MG PO TABS: 5.0000 mg | ORAL_TABLET | Freq: Every day | ORAL | Status: DC

## 2017-08-03 MED ORDER — INSULIN ASPART PROT & ASPART (70-30 MIX) 100 UNIT/ML ~~LOC~~ SUSP
35.0000 [IU] | Freq: Every day | SUBCUTANEOUS | Status: DC
  Administered 2017-08-04 – 2017-08-05 (×2): 35 [IU] via SUBCUTANEOUS
  Filled 2017-08-03: qty 10

## 2017-08-03 MED ORDER — VITAMIN D (ERGOCALCIFEROL) 1.25 MG (50000 UNIT) PO CAPS: 50000.0000 [IU] | ORAL_CAPSULE | ORAL | Status: DC

## 2017-08-03 MED ORDER — IPRATROPIUM BROMIDE 0.02 % IN SOLN: 0.5000 mg | Freq: Four times a day (QID) | RESPIRATORY_TRACT | Status: DC | PRN

## 2017-08-03 MED ORDER — POTASSIUM CHLORIDE CRYS ER 20 MEQ PO TBCR
40.0000 meq | EXTENDED_RELEASE_TABLET | Freq: Once | ORAL | Status: AC
  Administered 2017-08-03: 40 meq via ORAL
  Filled 2017-08-03: qty 2

## 2017-08-03 MED ORDER — ONDANSETRON HCL 4 MG/2ML IJ SOLN: 4.0000 mg | Freq: Four times a day (QID) | INTRAMUSCULAR | Status: DC | PRN

## 2017-08-03 MED ORDER — LISINOPRIL 10 MG PO TABS
20.0000 mg | ORAL_TABLET | Freq: Every day | ORAL | Status: DC
  Administered 2017-08-04 – 2017-08-06 (×3): 20 mg via ORAL
  Filled 2017-08-03 (×3): qty 2

## 2017-08-03 MED ORDER — IPRATROPIUM-ALBUTEROL 0.5-2.5 (3) MG/3ML IN SOLN
3.0000 mL | Freq: Once | RESPIRATORY_TRACT | Status: AC
  Administered 2017-08-03: 3 mL via RESPIRATORY_TRACT
  Filled 2017-08-03: qty 3

## 2017-08-03 MED ORDER — GABAPENTIN 300 MG PO CAPS
300.0000 mg | ORAL_CAPSULE | Freq: Three times a day (TID) | ORAL | Status: DC
  Administered 2017-08-04 – 2017-08-06 (×8): 300 mg via ORAL
  Filled 2017-08-03 (×8): qty 1

## 2017-08-03 MED ORDER — IOPAMIDOL (ISOVUE-370) INJECTION 76%
100.0000 mL | Freq: Once | INTRAVENOUS | Status: AC | PRN
  Administered 2017-08-03: 100 mL via INTRAVENOUS

## 2017-08-03 MED ORDER — POTASSIUM CHLORIDE CRYS ER 10 MEQ PO TBCR
10.0000 meq | EXTENDED_RELEASE_TABLET | Freq: Every evening | ORAL | Status: DC
  Administered 2017-08-04 – 2017-08-05 (×2): 10 meq via ORAL
  Filled 2017-08-03 (×4): qty 1

## 2017-08-03 MED ORDER — INSULIN ASPART PROT & ASPART (70-30 MIX) 100 UNIT/ML ~~LOC~~ SUSP
45.0000 [IU] | Freq: Every day | SUBCUTANEOUS | Status: DC
  Administered 2017-08-04 – 2017-08-05 (×3): 45 [IU] via SUBCUTANEOUS

## 2017-08-03 MED ORDER — INSULIN ASPART PROT & ASPART (70-30 MIX) 100 UNIT/ML ~~LOC~~ SUSP
35.0000 [IU] | Freq: Every day | SUBCUTANEOUS | Status: DC
  Administered 2017-08-04: 35 [IU] via SUBCUTANEOUS

## 2017-08-03 MED ORDER — ONDANSETRON HCL 4 MG PO TABS: 4.0000 mg | ORAL_TABLET | Freq: Four times a day (QID) | ORAL | Status: DC | PRN

## 2017-08-03 MED ORDER — METFORMIN HCL 500 MG PO TABS: 1000.0000 mg | ORAL_TABLET | Freq: Two times a day (BID) | ORAL | Status: DC

## 2017-08-03 MED ORDER — INSULIN ASPART PROT & ASPART (70-30 MIX) 100 UNIT/ML ~~LOC~~ SUSP: 35.0000 [IU] | Freq: Three times a day (TID) | SUBCUTANEOUS | Status: DC

## 2017-08-03 MED ORDER — TRAMADOL HCL 50 MG PO TABS
50.0000 mg | ORAL_TABLET | Freq: Four times a day (QID) | ORAL | Status: DC | PRN
  Administered 2017-08-05 – 2017-08-06 (×2): 50 mg via ORAL
  Filled 2017-08-03 (×2): qty 1

## 2017-08-03 MED ORDER — ATORVASTATIN CALCIUM 40 MG PO TABS
40.0000 mg | ORAL_TABLET | Freq: Every day | ORAL | Status: DC
  Administered 2017-08-04 – 2017-08-06 (×3): 40 mg via ORAL
  Filled 2017-08-03 (×3): qty 1

## 2017-08-03 MED ORDER — ROSUVASTATIN CALCIUM 40 MG PO TABS: 40.0000 mg | ORAL_TABLET | Freq: Every day | ORAL | Status: DC

## 2017-08-03 MED ORDER — CYCLOSPORINE 0.05 % OP EMUL
1.0000 [drp] | Freq: Every day | OPHTHALMIC | Status: DC
  Administered 2017-08-04 – 2017-08-06 (×3): 1 [drp] via OPHTHALMIC
  Filled 2017-08-03 (×3): qty 1

## 2017-08-03 MED ORDER — ALBUTEROL SULFATE (2.5 MG/3ML) 0.083% IN NEBU: 2.5000 mg | INHALATION_SOLUTION | Freq: Four times a day (QID) | RESPIRATORY_TRACT | Status: DC | PRN

## 2017-08-03 MED ORDER — PANTOPRAZOLE SODIUM 40 MG PO TBEC
40.0000 mg | DELAYED_RELEASE_TABLET | Freq: Every day | ORAL | Status: DC
  Administered 2017-08-04 – 2017-08-06 (×3): 40 mg via ORAL
  Filled 2017-08-03 (×3): qty 1

## 2017-08-03 MED ORDER — ALBUTEROL SULFATE (2.5 MG/3ML) 0.083% IN NEBU
2.5000 mg | INHALATION_SOLUTION | Freq: Once | RESPIRATORY_TRACT | Status: AC
  Administered 2017-08-03: 2.5 mg via RESPIRATORY_TRACT
  Filled 2017-08-03: qty 3

## 2017-08-03 MED ORDER — ENOXAPARIN SODIUM 120 MG/0.8ML ~~LOC~~ SOLN
1.0000 mg/kg | Freq: Once | SUBCUTANEOUS | Status: AC
  Administered 2017-08-03: 120 mg via SUBCUTANEOUS
  Filled 2017-08-03: qty 0.8

## 2017-08-03 MED ORDER — MAGNESIUM SULFATE 2 GM/50ML IV SOLN
2.0000 g | Freq: Once | INTRAVENOUS | Status: DC
  Filled 2017-08-03: qty 50

## 2017-08-03 MED ORDER — MONTELUKAST SODIUM 10 MG PO TABS
10.0000 mg | ORAL_TABLET | Freq: Every day | ORAL | Status: DC
  Administered 2017-08-04 – 2017-08-05 (×2): 10 mg via ORAL
  Filled 2017-08-03 (×2): qty 1

## 2017-08-03 NOTE — ED Notes (Signed)
Lung sounds clear 

## 2017-08-03 NOTE — ED Triage Notes (Signed)
Pt c/o dry cough x 1.5 weeks now. Pain now around right ribs with coughing. Nad. Denies fevers.

## 2017-08-03 NOTE — ED Notes (Signed)
Respiratory paged at this time for tx.  

## 2017-08-03 NOTE — ED Notes (Signed)
Lab to collect blood.

## 2017-08-03 NOTE — H&P (Signed)
History and Physical    Sonya James JHE:[id number] DOB: 02-25-1966 DOA: 08/03/2017  PCP: Rogers Blocker, MD   Patient coming from: Home.  I have personally briefly reviewed patient's old medical records in [place]  Chief Complaint: Cough and right ribs pain.  HPI: Sonya James is a 51 y.o. 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  ED Course: Initial vital signs in the emergency department temperature 98.62F, pulse 85, respirations 20, O2 sat 98% on room air and blood pressure 139/91 mmHg.  She received Lovenox 1 mg/kg in the emergency department.  A DuoNeb and an albuterol neb treatment.  Lab work shows a normal CBC.  D-dimer was 2.50.  Her CMP shows potassium level at 3.3 mmol/L.  Her blood glucose is 369 and calcium 10.7 mg/dL.  All other values are normal.  Imaging CT angio chest shows multiple bilateral segmental and subsegmental pulmonary emboli.  There is also atelectasis in the lung bases.  Review of Systems: As per HPI otherwise 10 point review of systems negative.    Past Medical History:  Diagnosis Date  . Anemia   . Anxiety   . Arthritis   . Asthma   . Autonomic neuropathy   . Constipation   . COPD (chronic obstructive pulmonary disease) ([place])   . CTS (carpal tunnel syndrome)   . Depression   . Diabetes mellitus without complication ([place])   . Endometrial cancer ([place]) 2017  .  GERD (gastroesophageal reflux disease)   . Headache(784.0)   . HTN (hypertension)   . Hypercholesterolemia   . Hypothyroidism   . IBS (irritable bowel syndrome)   . Osteoarthritis    osteoarthritis  . Pancreatitis    per patient   . Recurrent boils    buttocks and low back.  . Shortness of breath   . Sleep apnea    CPAP machine  . Uterine cancer ([place]) 2017  . Vitamin D deficiency     Past Surgical History:  Procedure Laterality Date  . ABDOMINAL HYSTERECTOMY     endometrial cancer, surgery done at [facility]   . BIOPSY  01/02/2017   Procedure: BIOPSY;  Surgeon: Danie Binder, MD;  Location: [facility];  Service: Endoscopy;;  gastric biopsy  . BREAST REDUCTION SURGERY    . CESAREAN SECTION    . COLONOSCOPY WITH PROPOFOL N/A 01/02/2017   Dr. Oneida Alar: redundant colon, two 2-3 mm polyps in sigmoid colon (hyperplastic)  . ESOPHAGOGASTRODUODENOSCOPY  05/22/2011   Dr. Oneida Alar: H.pylori gastritis   . ESOPHAGOGASTRODUODENOSCOPY (EGD) WITH PROPOFOL N/A 01/02/2017   Dr. 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 FOOT SURGERY Left    tendon repair  . POLYPECTOMY  01/02/2017   Procedure: POLYPECTOMY;  Surgeon: Danie Binder, MD;  Location: [facility];  Service: Endoscopy;;  sigmoid colon polyps times 2  . SAVORY DILATION N/A 01/02/2017   Procedure: SAVORY DILATION;  Surgeon: Danie Binder, MD;  Location: [facility];  Service: Endoscopy;  Laterality: N/A;  . TUBAL LIGATION       reports that she has been smoking cigarettes.  She has a 5.50 pack-year smoking history. she has never used smokeless tobacco. She reports that she does not drink alcohol or use drugs.  No Known Allergies  Family History  Problem Relation Age of Onset  . Diabetes Mother   . Hypertension Mother   . COPD Mother   . Asthma Mother   . Hypertension Sister   . Hyperlipidemia Sister   . Diabetes Sister   . Asthma Brother   . Alcohol abuse Brother    . Asthma Son   . Cancer Maternal Grandmother        lung, throat  . Heart disease Maternal Grandfather   . Hypertension Sister   . Hyperlipidemia Sister   . Colon cancer Neg Hx     Prior to Admission medications   Medication Sig Start Date End Date Taking? Authorizing Provider  albuterol (PROVENTIL HFA;VENTOLIN HFA) 108 (90 Base) MCG/ACT inhaler Inhale 2 puffs into the lungs every 6 (six) hours as needed for wheezing or shortness of breath.   Yes [provider]  albuterol (PROVENTIL) (2.5 MG/3ML) 0.083% nebulizer solution Take 3 mLs (2.5 mg total) by nebulization every 6 (six) hours as needed for wheezing. 07/05/14  Yes Kathie Dike, MD  atorvastatin (LIPITOR) 40 MG tablet Take 40 mg by mouth daily.   Yes [provider]  cycloSPORINE (RESTASIS) 0.05 % ophthalmic emulsion Place 1 drop into both eyes daily.   Yes [provider]  esomeprazole (NEXIUM) 40 MG capsule Take 1 capsule (40 mg total) by mouth 2 (two) times daily before a meal. 01/02/17  Yes Fields, [name] L, MD  furosemide (LASIX) 40 MG tablet Take 1 tablet (40 mg total) by mouth daily. 05/27/13  Yes Kathie Dike, MD  gabapentin (NEURONTIN) 300 MG capsule Take 300 mg by mouth 3 (three) times daily.   Yes [provider]  hydrocortisone 1 % ointment Apply 1 application topically 2 (two) times daily. 12/13/16  Yes Jonnie Kind, MD  insulin NPH-regular Human (NOVOLIN 70/30) (70-30) 100 UNIT/ML injection Inject 35-45 Units into the skin 3 (three) times daily. 35 units in the morning at breakffast and 35 units in the evening with supper and 45 units at bedtime   Yes [provider]  lisinopril (PRINIVIL,ZESTRIL) 20 MG tablet Take 1 tablet (20 mg total) by mouth daily. 07/05/14  Yes Kathie Dike, MD  metFORMIN (GLUCOPHAGE) 1000 MG tablet Take 1 tablet (1,000 mg total) by mouth 2 (two) times daily with a meal. 07/05/14  Yes Memon, Jolaine Artist, MD  montelukast (SINGULAIR) 10 MG tablet Take  10 mg by mouth at bedtime.    Yes [provider]  naproxen (NAPROSYN) 500 MG tablet Take 1 tablet (500 mg total) by mouth 2 (two) times daily with a meal. 05/08/17  Yes Sanjuana Kava, MD  naproxen (NAPROSYN) 500 MG tablet Take 1 tablet (500 mg total) by mouth 2 (two) times daily with a meal. 06/26/17  Yes Sanjuana Kava, MD  nystatin (MYCOSTATIN) 100000 UNIT/ML suspension Take 5 mLs (500,000 Units total) by mouth 4 (four) times daily. 05/09/17  Yes Annitta Needs, NP  ondansetron (ZOFRAN ODT) 4 MG disintegrating tablet Take 4 mg by mouth every 8 (eight) hours as needed for nausea or vomiting.  10/11/15  Yes [provider]  potassium chloride (K-DUR) 10 MEQ tablet Take 10 mEq by  mouth every evening.  07/05/14  Yes [provider]  rosuvastatin (CRESTOR) 40 MG tablet Take 1 tablet (40 mg total) by mouth daily. 07/05/14  Yes Kathie Dike, MD  sitaGLIPtin (JANUVIA) 100 MG tablet Take 100 mg by mouth daily.   Yes [provider]  traMADol (ULTRAM) 50 MG tablet Take 50 mg by mouth every 6 (six) hours as needed for moderate pain.   Yes [provider]  Vitamin D, Ergocalciferol, (DRISDOL) 50000 units CAPS capsule Take 50,000 Units by mouth every 7 (seven) days.   Yes [provider]  linaclotide Rolan Lipa) 290 MCG CAPS capsule Take 1 capsule (290 mcg total) by mouth daily before breakfast. 05/09/17   Annitta Needs, NP    Physical Exam: Vitals:   08/03/17 1614 08/03/17 1615 08/03/17 1756 08/03/17 2111  BP:  (!) 139/91  (!) 164/79  Pulse: 85   95  Resp: (!) 22   (!) 24  Temp: 98.4 F (36.9 C)     TempSrc: Oral     SpO2: 98%  98% 95%    Constitutional: NAD, calm, comfortable Eyes: PERRL, lids and conjunctivae normal ENMT: Mucous membranes are moist. Posterior pharynx clear of any exudate or lesions. Neck: normal, supple, no masses, no thyromegaly Respiratory: Decreased breath sounds on bases, no wheezing, no crackles.  Shallow respiratory  effort. No accessory muscle use.  Cardiovascular: Regular rate and rhythm, no murmurs / rubs / gallops. No extremity edema. 2+ pedal pulses. No carotid bruits.  Abdomen: Obese, soft, no tenderness, no masses palpated. No hepatosplenomegaly. Bowel sounds positive.  Musculoskeletal: no clubbing / cyanosis.  Good ROM, no contractures. Normal muscle tone.  Skin: no rashes, lesions, ulcers. No induration Neurologic: CN 2-12 grossly intact. Sensation intact, DTR normal. Strength 5/5 in all 4.  Psychiatric: Normal judgment and insight. Alert and oriented x 3. Normal mood.    Labs on Admission: I have personally reviewed following labs and imaging studies  CBC: Recent Labs  Lab 08/03/17 1930  WBC 9.3  NEUTROABS 6.0  HGB 14.6  HCT 44.1  MCV 93.4  PLT 564   Basic Metabolic Panel: Recent Labs  Lab 08/03/17 1930  NA 137  K 3.3*  CL 101  CO2 26  GLUCOSE 369*  BUN 8  CREATININE 0.80  CALCIUM 10.7*   GFR: Estimated Creatinine Clearance: 110.3 mL/min (by C-G formula based on SCr of 0.8 mg/dL). Liver Function Tests: No results for input(s): AST, ALT, ALKPHOS, BILITOT, PROT, ALBUMIN in the last 168 hours. No results for input(s): LIPASE, AMYLASE in the last 168 hours. No results for input(s): AMMONIA in the last 168 hours. Coagulation Profile: No results for input(s): INR, PROTIME in the last 168 hours. Cardiac Enzymes: No results for input(s): CKTOTAL, CKMB, CKMBINDEX, TROPONINI in the last 168 hours. BNP (last 3 results) No results for input(s): PROBNP in the last 8760 hours. HbA1C: No results for input(s): HGBA1C in the last 72 hours. CBG: Recent Labs  Lab 08/03/17 1754  GLUCAP 404*   Lipid Profile: No results for input(s): CHOL, HDL, LDLCALC, TRIG, CHOLHDL, LDLDIRECT in the last 72 hours. Thyroid Function Tests: No results for input(s): TSH, T4TOTAL, FREET4, T3FREE, THYROIDAB in the last 72 hours. Anemia Panel: No results for input(s): VITAMINB12, FOLATE, FERRITIN, TIBC,  IRON, RETICCTPCT in the last 72 hours. Urine analysis:    Component Value Date/Time   COLORURINE YELLOW 12/23/2014 1501   APPEARANCEUR CLEAR 12/23/2014 1501   LABSPEC 1.015 12/23/2014 1501   PHURINE 5.5 12/23/2014 1501  GLUCOSEU >1000 (A) 12/23/2014 1501   HGBUR MODERATE (A) 12/23/2014 1501   BILIRUBINUR NEGATIVE 12/23/2014 1501   KETONESUR NEGATIVE 12/23/2014 1501   PROTEINUR 30 (A) 12/23/2014 1501   UROBILINOGEN 0.2 12/23/2014 1501   NITRITE NEGATIVE 12/23/2014 1501   LEUKOCYTESUR NEGATIVE 12/23/2014 1501    Radiological Exams on Admission: Dg Chest 2 View  Result Date: 08/03/2017 CLINICAL DATA:  Chest pain. EXAM: CHEST  2 VIEW COMPARISON:  Radiograph of July 04, 2014. FINDINGS: The heart size and mediastinal contours are within normal limits. Both lungs are clear. No pneumothorax or pleural effusion is noted. The visualized skeletal structures are unremarkable. IMPRESSION: No active cardiopulmonary disease. Electronically Signed   By: Marijo Conception, M.D.   On: 08/03/2017 16:43   Ct Angio Chest Pe W And/or Wo Contrast  Result Date: 08/03/2017 CLINICAL DATA:  Shortness of breath and weakness for 2 weeks. Right sharp pain under the breast for 2 weeks. Positive D-dimer. EXAM: CT ANGIOGRAPHY CHEST WITH CONTRAST TECHNIQUE: Multidetector CT imaging of the chest was performed using the standard protocol during bolus administration of intravenous contrast. Multiplanar CT image reconstructions and MIPs were obtained to evaluate the vascular anatomy. CONTRAST:  178mL ISOVUE-370 IOPAMIDOL (ISOVUE-370) INJECTION 76% COMPARISON:  None. FINDINGS: Cardiovascular: Moderately good opacification of the central and segmental pulmonary arteries. Filling defects are demonstrated in bilateral upper and lower lobe segmental and subsegmental branches. Appearance is consistent with acute pulmonary embolus. RV to LV ratio is 0.9. Normal heart size. No pericardial effusion. Normal caliber thoracic aorta. No  aortic dissection. Great vessel origins are patent. Mediastinum/Nodes: Scattered mediastinal lymph nodes are not pathologically enlarged. Esophagus is decompressed. Lungs/Pleura: Motion artifact limits evaluation. Linear areas of atelectasis in the lung bases. No focal consolidation. No pleural effusions. No pneumothorax. Airways are patent. Upper Abdomen: No acute abnormality. Musculoskeletal: No chest wall abnormality. No acute or significant osseous findings. Review of the MIP images confirms the above findings. IMPRESSION: 1. Multiple bilateral segmental and subsegmental pulmonary emboli. No evidence of right heart strain. 2. Atelectasis in the lung bases. No evidence of active pulmonary disease. These results were called by telephone at the time of interpretation on 08/03/2017 at 9:19 pm to Dr. Gilford Raid, who verbally acknowledged these results. Electronically Signed   By: Lucienne Capers M.D.   On: 08/03/2017 21:21    EKG: Independently reviewed.  EKG ordered.  Assessment/Plan Principal Problem:   Pulmonary embolism ([place]) Admit to telemetry/inpatient. Received Lovenox in the emergency department. Will switch to heparin while she is in the hospital. Please bridge to oral anticoagulation. Follow-up CBC, heparin APTT. Check echocardiogram in the morning.  Active Problems:   GERD (gastroesophageal reflux disease) Pantoprazole 40 mg p.o. daily.    COPD (chronic obstructive pulmonary disease) ([place]) DuoNeb every 6 hours.    Tobacco abuse Patient, she smokes very little.   Currently not smoking due to symptoms. Declined nicotine replacement therapy, because she thinks she does not needed. Tobacco cessation information to be provided by the staff.    HTN (hypertension), benign Continue lisinopril 20 mg p.o. daily. Monitor blood pressure, renal function and electrolytes.    Hypercholesteremia Continue atorvastatin 40 mg p.o. daily. Monitor LFTs periodically. Fasting lipid panel as an  outpatient.    Diabetes mellitus with neuropathy ([facility]) Carbohydrate modified diet. Tradjenta 5 mg p.o. daily. Continue metformin 1000 mg p.o. twice daily. Continue NovoLog mix 35-45 units SQ 3 times daily. Continue gabapentin 300 mg p.o. 3 times daily.    Sleep apnea Continue nocturnal  CPAP    DVT prophylaxis: Full dose anticoagulation. Code Status: Full code. Family Communication:  Disposition Plan: Admit for 2-3 days for PE treatment.  Echocardiogram in a.m. Consults called:  Admission status: Inpatient/telemetry.   Reubin Milan MD [facility] Pager [phone].  If 7PM-7AM, please contact night-coverage [email or web address] Password [facility]  08/03/2017, 10:34 PM

## 2017-08-03 NOTE — ED Notes (Signed)
PA Triplett notified of CBG.

## 2017-08-03 NOTE — ED Notes (Signed)
Lab has collected blood.

## 2017-08-03 NOTE — ED Provider Notes (Signed)
[facility] EMERGENCY DEPARTMENT Provider Note   CSN: [id number] Arrival date & time: 08/03/17  1545     History   Chief Complaint Chief Complaint  Patient presents with  . Cough    HPI SIYA FLURRY is a 51 y.o. female.  HPI   NAWAL BURLING is a 51 y.o. female who presents to the Emergency Department complaining of cough, shortness of breath and right sided chest pain. Dry cough present for over one week.  Noticed pain to her right ribs and increased pain with deep breathing for few days.  Shortness of breath worse with exertion.  Has hx of asthma and COPD, and uses inhalers, but medication not helping.  She denies fever, abdominal pain, vomiting, sore throat or nasal congestion.   Past Medical History:  Diagnosis Date  . Anemia   . Anxiety   . Arthritis   . Asthma   . Autonomic neuropathy   . Constipation   . COPD (chronic obstructive pulmonary disease) ([place])   . CTS (carpal tunnel syndrome)   . Depression   . Diabetes mellitus without complication ([place])   . Endometrial cancer ([place]) 2017  . GERD (gastroesophageal reflux disease)   . Headache(784.0)   . HTN (hypertension)   . Hypercholesterolemia   . Hypothyroidism   . IBS (irritable bowel syndrome)   . Osteoarthritis    osteoarthritis  . Pancreatitis    per patient   . Recurrent boils    buttocks and low back.  . Shortness of breath   . Sleep apnea    CPAP machine  . Uterine cancer ([place]) 2017  . Vitamin D deficiency     Patient Active Problem List   Diagnosis Date Noted  . Encounter for screening colonoscopy   . Gastritis without bleeding   . Dysphagia 12/14/2016  . Endometrial cancer ([place]) 10/25/2015  . Diabetes mellitus with neuropathy ([place]) 10/25/2015  . Cancer of endometrium ([place]) 10/18/2015  . Heavy menses 10/11/2015  . COPD exacerbation ([place]) 07/04/2014  . Type II or unspecified type diabetes mellitus without mention of complication, not stated as uncontrolled 05/27/2013  . Acute bronchitis  05/25/2013  . Fever 05/25/2013  . Asthma 05/25/2013  . COPD (chronic obstructive pulmonary disease) ([place]) 05/25/2013  . Tobacco abuse 05/25/2013  . Obesity 05/25/2013  . HTN (hypertension), benign 05/25/2013  . Hypercholesteremia 05/25/2013  . Pedal edema 05/25/2013  . Thyromegaly 05/25/2013  . Abdominal pain 04/11/2011  . Constipation 04/11/2011  . GERD (gastroesophageal reflux disease) 04/11/2011    Past Surgical History:  Procedure Laterality Date  . ABDOMINAL HYSTERECTOMY     endometrial cancer, surgery done at [facility]   . BIOPSY  01/02/2017   Procedure: BIOPSY;  Surgeon: Danie Binder, MD;  Location: [facility] ENDO SUITE;  Service: Endoscopy;;  gastric biopsy  . BREAST REDUCTION SURGERY    . CESAREAN SECTION    . COLONOSCOPY WITH PROPOFOL N/A 01/02/2017   Dr. Oneida Alar: redundant colon, two 2-3 mm polyps in sigmoid colon (hyperplastic)  . ESOPHAGOGASTRODUODENOSCOPY  05/22/2011   Dr. Oneida Alar: H.pylori gastritis   . ESOPHAGOGASTRODUODENOSCOPY (EGD) WITH PROPOFOL N/A 01/02/2017   Dr. 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 FOOT SURGERY Left    tendon repair  . POLYPECTOMY  01/02/2017   Procedure: POLYPECTOMY;  Surgeon: Danie Binder, MD;  Location: [facility] ENDO SUITE;  Service: Endoscopy;;  sigmoid colon polyps times 2  . SAVORY DILATION N/A 01/02/2017   Procedure: SAVORY  DILATION;  Surgeon: Danie Binder, MD;  Location: [facility] ENDO SUITE;  Service: Endoscopy;  Laterality: N/A;  . TUBAL LIGATION      OB History    Gravida Para Term Preterm AB Living   1 1 1     1    SAB TAB Ectopic Multiple Live Births           1       Home Medications    Prior to Admission medications   Medication Sig Start Date End Date Taking? Authorizing Provider  albuterol (PROVENTIL HFA;VENTOLIN HFA) 108 (90 Base) MCG/ACT inhaler Inhale 2 puffs into the lungs every 6 (six) hours as needed for wheezing or shortness of breath.   Yes [provider]  albuterol (PROVENTIL) (2.5 MG/3ML) 0.083% nebulizer solution Take 3 mLs (2.5 mg total) by nebulization every 6 (six) hours as needed for wheezing. 07/05/14  Yes Kathie Dike, MD  atorvastatin (LIPITOR) 40 MG tablet Take 40 mg by mouth daily.   Yes [provider]  cycloSPORINE (RESTASIS) 0.05 % ophthalmic emulsion Place 1 drop into both eyes daily.   Yes [provider]  esomeprazole (NEXIUM) 40 MG capsule Take 1 capsule (40 mg total) by mouth 2 (two) times daily before a meal. 01/02/17  Yes Fields, [name] L, MD  furosemide (LASIX) 40 MG tablet Take 1 tablet (40 mg total) by mouth daily. 05/27/13  Yes Kathie Dike, MD  gabapentin (NEURONTIN) 300 MG capsule Take 300 mg by mouth 3 (three) times daily.   Yes [provider]  hydrocortisone 1 % ointment Apply 1 application topically 2 (two) times daily. 12/13/16  Yes Jonnie Kind, MD  insulin NPH-regular Human (NOVOLIN 70/30) (70-30) 100 UNIT/ML injection Inject 35-45 Units into the skin 3 (three) times daily. 35 units in the morning at breakffast and 35 units in the evening with supper and 45 units at bedtime   Yes [provider]  lisinopril (PRINIVIL,ZESTRIL) 20 MG tablet Take 1 tablet (20 mg total) by mouth daily. 07/05/14  Yes Kathie Dike, MD  metFORMIN (GLUCOPHAGE) 1000 MG tablet Take 1 tablet (1,000 mg total) by mouth 2 (two) times daily with a meal. 07/05/14  Yes Memon, Jolaine Artist, MD  montelukast (SINGULAIR) 10 MG tablet Take 10 mg by mouth at bedtime.    Yes [provider]  naproxen (NAPROSYN) 500 MG tablet Take 1 tablet (500 mg total) by mouth 2 (two) times daily with a meal. 05/08/17  Yes Sanjuana Kava, MD  naproxen (NAPROSYN) 500 MG tablet Take 1 tablet (500 mg total) by mouth 2 (two) times daily with a meal. 06/26/17  Yes Sanjuana Kava, MD  nystatin (MYCOSTATIN) 100000 UNIT/ML suspension Take 5 mLs (500,000 Units total) by mouth 4 (four) times daily. 05/09/17  Yes Annitta Needs, NP    ondansetron (ZOFRAN ODT) 4 MG disintegrating tablet Take 4 mg by mouth every 8 (eight) hours as needed for nausea or vomiting.  10/11/15  Yes [provider]  potassium chloride (K-DUR) 10 MEQ tablet Take 10 mEq by mouth every evening.  07/05/14  Yes [provider]  rosuvastatin (CRESTOR) 40 MG tablet Take 1 tablet (40 mg total) by mouth daily. 07/05/14  Yes Kathie Dike, MD  sitaGLIPtin (JANUVIA) 100 MG tablet Take 100 mg by mouth daily.   Yes [provider]  traMADol (ULTRAM) 50 MG tablet Take 50 mg by mouth every 6 (six) hours as needed for moderate pain.   Yes [provider]  Vitamin D,  Ergocalciferol, (DRISDOL) 50000 units CAPS capsule Take 50,000 Units by mouth every 7 (seven) days.   Yes [provider]  linaclotide Rolan Lipa) 290 MCG CAPS capsule Take 1 capsule (290 mcg total) by mouth daily before breakfast. 05/09/17   Annitta Needs, NP    Family History Family History  Problem Relation Age of Onset  . Diabetes Mother   . Hypertension Mother   . COPD Mother   . Asthma Mother   . Hypertension Sister   . Hyperlipidemia Sister   . Diabetes Sister   . Asthma Brother   . Alcohol abuse Brother   . Asthma Son   . Cancer Maternal Grandmother        lung, throat  . Heart disease Maternal Grandfather   . Hypertension Sister   . Hyperlipidemia Sister   . Colon cancer Neg Hx     Social History Social History   Tobacco Use  . Smoking status: Current Some Day Smoker    Packs/day: 0.25    Years: 22.00    Pack years: 5.50    Types: Cigarettes  . Smokeless tobacco: Never Used  . Tobacco comment: 2-3 a day  Substance Use Topics  . Alcohol use: No  . Drug use: No     Allergies   Patient has no known allergies.   Review of Systems Review of Systems  Constitutional: Positive for fatigue. Negative for appetite change, chills and fever.  HENT: Negative for congestion, rhinorrhea, sore throat and trouble swallowing.    Respiratory: Positive for cough, chest tightness and shortness of breath. Negative for wheezing.   Cardiovascular: Negative for chest pain (right chest pain).  Gastrointestinal: Negative for abdominal pain, nausea and vomiting.  Genitourinary: Negative for dysuria.  Musculoskeletal: Negative for arthralgias.  Skin: Negative for rash.  Neurological: Negative for dizziness, weakness and numbness.  Hematological: Negative for adenopathy.  All other systems reviewed and are negative.    Physical Exam Updated Vital Signs BP (!) 164/79 (BP Location: Left Arm)   Pulse 95   Temp 98.4 F (36.9 C) (Oral)   Resp (!) 24   LMP 09/20/2015 (Exact Date)   SpO2 95%   Physical Exam  Constitutional: She is oriented to person, place, and time. She appears well-developed and well-nourished. No distress.  HENT:  Head: Normocephalic and atraumatic.  Right Ear: Tympanic membrane and ear canal normal.  Left Ear: Tympanic membrane and ear canal normal.  Mouth/Throat: Uvula is midline, oropharynx is clear and moist and mucous membranes are normal. No oropharyngeal exudate.  Eyes: EOM are normal. Pupils are equal, round, and reactive to light.  Neck: Normal range of motion, full passive range of motion without pain and phonation normal. Neck supple.  Cardiovascular: Normal rate, regular rhythm, normal heart sounds and intact distal pulses.  No murmur heard. Pulmonary/Chest: Effort normal. No stridor. No respiratory distress. She has no rales.  Labored breathing on exam.  Lung sounds clear.  Right chest wall non-tender to palp  Abdominal: Soft. She exhibits no distension. There is no tenderness. There is no guarding.  Musculoskeletal: Normal range of motion. She exhibits no edema.  Lymphadenopathy:    She has no cervical adenopathy.  Neurological: She is alert and oriented to person, place, and time. No sensory deficit. She exhibits normal muscle tone. Coordination normal.  Skin: Skin is warm and dry.  Capillary refill takes less than 2 seconds.  Nursing note and vitals reviewed.    ED Treatments / Results  Labs (all labs  ordered are listed, but only abnormal results are displayed) Labs Reviewed  BASIC METABOLIC PANEL - Abnormal; Notable for the following components:      Result Value   Potassium 3.3 (*)    Glucose, Bld 369 (*)    Calcium 10.7 (*)    All other components within normal limits  D-DIMER, QUANTITATIVE (NOT AT [facility]) - Abnormal; Notable for the following components:   D-Dimer, Quant 2.50 (*)    All other components within normal limits  CBG MONITORING, ED - Abnormal; Notable for the following components:   Glucose-Capillary 404 (*)    All other components within normal limits  CBC WITH DIFFERENTIAL/PLATELET    EKG  EKG Interpretation None       Radiology Dg Chest 2 View  Result Date: 08/03/2017 CLINICAL DATA:  Chest pain. EXAM: CHEST  2 VIEW COMPARISON:  Radiograph of July 04, 2014. FINDINGS: The heart size and mediastinal contours are within normal limits. Both lungs are clear. No pneumothorax or pleural effusion is noted. The visualized skeletal structures are unremarkable. IMPRESSION: No active cardiopulmonary disease. Electronically Signed   By: Marijo Conception, M.D.   On: 08/03/2017 16:43   Ct Angio Chest Pe W And/or Wo Contrast  Result Date: 08/03/2017 CLINICAL DATA:  Shortness of breath and weakness for 2 weeks. Right sharp pain under the breast for 2 weeks. Positive D-dimer. EXAM: CT ANGIOGRAPHY CHEST WITH CONTRAST TECHNIQUE: Multidetector CT imaging of the chest was performed using the standard protocol during bolus administration of intravenous contrast. Multiplanar CT image reconstructions and MIPs were obtained to evaluate the vascular anatomy. CONTRAST:  174mL ISOVUE-370 IOPAMIDOL (ISOVUE-370) INJECTION 76% COMPARISON:  None. FINDINGS: Cardiovascular: Moderately good opacification of the central and segmental pulmonary arteries. Filling defects are  demonstrated in bilateral upper and lower lobe segmental and subsegmental branches. Appearance is consistent with acute pulmonary embolus. RV to LV ratio is 0.9. Normal heart size. No pericardial effusion. Normal caliber thoracic aorta. No aortic dissection. Great vessel origins are patent. Mediastinum/Nodes: Scattered mediastinal lymph nodes are not pathologically enlarged. Esophagus is decompressed. Lungs/Pleura: Motion artifact limits evaluation. Linear areas of atelectasis in the lung bases. No focal consolidation. No pleural effusions. No pneumothorax. Airways are patent. Upper Abdomen: No acute abnormality. Musculoskeletal: No chest wall abnormality. No acute or significant osseous findings. Review of the MIP images confirms the above findings. IMPRESSION: 1. Multiple bilateral segmental and subsegmental pulmonary emboli. No evidence of right heart strain. 2. Atelectasis in the lung bases. No evidence of active pulmonary disease. These results were called by telephone at the time of interpretation on 08/03/2017 at 9:19 pm to Dr. Gilford Raid, who verbally acknowledged these results. Electronically Signed   By: Lucienne Capers M.D.   On: 08/03/2017 21:21    Procedures Procedures (including critical care time)  Medications Ordered in ED Medications  enoxaparin (LOVENOX) injection 120 mg (not administered)  ipratropium-albuterol (DUONEB) 0.5-2.5 (3) MG/3ML nebulizer solution 3 mL (3 mLs Nebulization Given 08/03/17 1756)  albuterol (PROVENTIL) (2.5 MG/3ML) 0.083% nebulizer solution 2.5 mg (2.5 mg Nebulization Given 08/03/17 1756)  iopamidol (ISOVUE-370) 76 % injection 100 mL (100 mLs Intravenous Contrast Given 08/03/17 2101)     Initial Impression / Assessment and Plan / ED Course  I have reviewed the triage vital signs and the nursing notes.  Pertinent labs & imaging results that were available during my care of the patient were reviewed by me and considered in my medical decision making (see chart  for details).  Pt resting comfortably.  Vitals stable.  Mildly tachypneic.    Kelso Olevia Bowens, agrees to admit to telemetry.    Final Clinical Impressions(s) / ED Diagnoses   Final diagnoses:  Multiple pulmonary emboli [facility])    ED Discharge Orders    None       Kem Parkinson, PA-C 08/03/17 2226    Isla Pence, MD 08/03/17 2329

## 2017-08-04 ENCOUNTER — Encounter (HOSPITAL_COMMUNITY): Payer: Self-pay | Admitting: Internal Medicine

## 2017-08-04 ENCOUNTER — Inpatient Hospital Stay (HOSPITAL_COMMUNITY): Payer: Medicaid Other

## 2017-08-04 DIAGNOSIS — E1165 Type 2 diabetes mellitus with hyperglycemia: Secondary | ICD-10-CM

## 2017-08-04 DIAGNOSIS — E7800 Pure hypercholesterolemia, unspecified: Secondary | ICD-10-CM

## 2017-08-04 DIAGNOSIS — Z794 Long term (current) use of insulin: Secondary | ICD-10-CM

## 2017-08-04 DIAGNOSIS — IMO0002 Reserved for concepts with insufficient information to code with codable children: Secondary | ICD-10-CM

## 2017-08-04 DIAGNOSIS — E876 Hypokalemia: Secondary | ICD-10-CM

## 2017-08-04 DIAGNOSIS — I2699 Other pulmonary embolism without acute cor pulmonale: Principal | ICD-10-CM

## 2017-08-04 DIAGNOSIS — I10 Essential (primary) hypertension: Secondary | ICD-10-CM

## 2017-08-04 DIAGNOSIS — Z720 Tobacco use: Secondary | ICD-10-CM

## 2017-08-04 DIAGNOSIS — E1142 Type 2 diabetes mellitus with diabetic polyneuropathy: Secondary | ICD-10-CM

## 2017-08-04 HISTORY — DX: Type 2 diabetes mellitus with hyperglycemia: E11.65

## 2017-08-04 HISTORY — DX: Reserved for concepts with insufficient information to code with codable children: IMO0002

## 2017-08-04 LAB — ECHOCARDIOGRAM COMPLETE
Height: 66 in
Weight: 4144.648 oz

## 2017-08-04 LAB — BASIC METABOLIC PANEL
Anion gap: 8 (ref 5–15)
BUN: 8 mg/dL (ref 6–20)
CHLORIDE: 101 mmol/L (ref 101–111)
CO2: 25 mmol/L (ref 22–32)
Calcium: 9.7 mg/dL (ref 8.9–10.3)
Creatinine, Ser: 0.78 mg/dL (ref 0.44–1.00)
GFR calc Af Amer: >60 mL/min (ref >60)
GFR calc non Af Amer: >60 mL/min (ref >60)
Glucose, Bld: 449 mg/dL — ABNORMAL HIGH (ref 65–99)
POTASSIUM: 3.8 mmol/L (ref 3.5–5.1)
SODIUM: 134 mmol/L — AB (ref 135–145)

## 2017-08-04 LAB — CBC
HEMATOCRIT: 39.4 % (ref 36.0–46.0)
HEMOGLOBIN: 12.7 g/dL (ref 12.0–15.0)
MCH: 30.4 pg (ref 26.0–34.0)
MCHC: 32.2 g/dL (ref 30.0–36.0)
MCV: 94.3 fL (ref 78.0–100.0)
Platelets: 214 10*3/uL (ref 150–400)
RBC: 4.18 MIL/uL (ref 3.87–5.11)
RDW: 12.7 % (ref 11.5–15.5)
WBC: 7.8 10*3/uL (ref 4.0–10.5)

## 2017-08-04 LAB — HEPATIC FUNCTION PANEL
ALBUMIN: 3.3 g/dL — AB (ref 3.5–5.0)
ALT: 13 U/L — AB (ref 14–54)
AST: 17 U/L (ref 15–41)
Alkaline Phosphatase: 116 U/L (ref 38–126)
BILIRUBIN TOTAL: 0.4 mg/dL (ref 0.3–1.2)
Total Protein: 6.7 g/dL (ref 6.5–8.1)

## 2017-08-04 LAB — GLUCOSE, CAPILLARY
GLUCOSE-CAPILLARY: 226 mg/dL — AB (ref 65–99)
Glucose-Capillary: 447 mg/dL — ABNORMAL HIGH (ref 65–99)
Glucose-Capillary: 388 mg/dL — ABNORMAL HIGH (ref 65–99)
Glucose-Capillary: 372 mg/dL — ABNORMAL HIGH (ref 65–99)

## 2017-08-04 LAB — APTT: APTT: 27 s (ref 24–36)

## 2017-08-04 LAB — MAGNESIUM: MAGNESIUM: 1.9 mg/dL (ref 1.7–2.4)

## 2017-08-04 LAB — HEPARIN LEVEL (UNFRACTIONATED): HEPARIN UNFRACTIONATED: 0.66 [IU]/mL (ref 0.30–0.70)

## 2017-08-04 MED ORDER — INSULIN ASPART 100 UNIT/ML ~~LOC~~ SOLN
0.0000 [IU] | Freq: Every day | SUBCUTANEOUS | Status: DC
  Administered 2017-08-04: 2 [IU] via SUBCUTANEOUS
  Administered 2017-08-05: 4 [IU] via SUBCUTANEOUS

## 2017-08-04 MED ORDER — HEPARIN (PORCINE) IN NACL 100-0.45 UNIT/ML-% IJ SOLN
1500.0000 [IU]/h | INTRAMUSCULAR | Status: DC
  Administered 2017-08-04 – 2017-08-05 (×2): 1500 [IU]/h via INTRAVENOUS
  Filled 2017-08-04 (×3): qty 250

## 2017-08-04 MED ORDER — PROMETHAZINE HCL 25 MG/ML IJ SOLN
25.0000 mg | Freq: Once | INTRAMUSCULAR | Status: AC
  Administered 2017-08-04: 25 mg via INTRAVENOUS
  Filled 2017-08-04: qty 1

## 2017-08-04 MED ORDER — IPRATROPIUM-ALBUTEROL 0.5-2.5 (3) MG/3ML IN SOLN
3.0000 mL | Freq: Four times a day (QID) | RESPIRATORY_TRACT | Status: DC
  Administered 2017-08-04 – 2017-08-06 (×7): 3 mL via RESPIRATORY_TRACT
  Filled 2017-08-04 (×8): qty 3

## 2017-08-04 MED ORDER — HEPARIN BOLUS VIA INFUSION
4000.0000 [IU] | Freq: Once | INTRAVENOUS | Status: AC
  Administered 2017-08-04: 4000 [IU] via INTRAVENOUS
  Filled 2017-08-04: qty 4000

## 2017-08-04 MED ORDER — MORPHINE SULFATE (PF) 4 MG/ML IV SOLN: 4.0000 mg | INTRAVENOUS | Status: DC | PRN

## 2017-08-04 MED ORDER — INSULIN ASPART PROT & ASPART (70-30 MIX) 100 UNIT/ML ~~LOC~~ SUSP
SUBCUTANEOUS | Status: AC
  Filled 2017-08-04: qty 10

## 2017-08-04 MED ORDER — KETOROLAC TROMETHAMINE 30 MG/ML IJ SOLN
30.0000 mg | Freq: Once | INTRAMUSCULAR | Status: AC
  Administered 2017-08-04: 30 mg via INTRAVENOUS
  Filled 2017-08-04: qty 1

## 2017-08-04 MED ORDER — INSULIN ASPART 100 UNIT/ML ~~LOC~~ SOLN
0.0000 [IU] | Freq: Three times a day (TID) | SUBCUTANEOUS | Status: DC
  Administered 2017-08-04 (×2): 20 [IU] via SUBCUTANEOUS
  Administered 2017-08-05: 11 [IU] via SUBCUTANEOUS
  Administered 2017-08-05: 15 [IU] via SUBCUTANEOUS
  Administered 2017-08-05 – 2017-08-06 (×2): 11 [IU] via SUBCUTANEOUS

## 2017-08-04 NOTE — Progress Notes (Signed)
[place] for HEPARIN Indication: pulmonary embolus  No Known Allergies  Patient Measurements: Height: 5\' 6"  (167.6 cm) Weight: 259 lb 0.6 oz (117.5 kg) IBW/kg (Calculated) : 59.3 HEPARIN DW (KG): 87.1  Vital Signs: Temp: 98.4 F (36.9 C) (11/24 2019) Temp Source: Oral (11/24 2019) BP: 149/66 (11/24 2019) Pulse Rate: 88 (11/24 2019)  Labs: Recent Labs    08/03/17 1930 08/04/17 0759 08/04/17 1930  HGB 14.6 12.7  --   HCT 44.1 39.4  --   PLT 238 214  --   APTT  --  27  --   HEPARINUNFRC  --   --  0.66  CREATININE 0.80 0.78  --    Estimated Creatinine Clearance: 108.5 mL/min (by C-G formula based on SCr of 0.78 mg/dL).  Medical History: Past Medical History:  Diagnosis Date  . Anemia   . Anxiety   . Arthritis   . Asthma   . Autonomic neuropathy   . Constipation   . COPD (chronic obstructive pulmonary disease) ([place])   . CTS (carpal tunnel syndrome)   . Depression   . Diabetes mellitus without complication ([place])   . Endometrial cancer ([place]) 2017  . GERD (gastroesophageal reflux disease)   . Headache(784.0)   . HTN (hypertension)   . Hypercholesterolemia   . Hypothyroidism   . IBS (irritable bowel syndrome)   . Osteoarthritis    osteoarthritis  . Pancreatitis    per patient   . Recurrent boils    buttocks and low back.  . Shortness of breath   . Sleep apnea    CPAP machine  . Uterine cancer ([place]) 2017  . Vitamin D deficiency    Medications:  Medications Prior to Admission  Medication Sig Dispense Refill Last Dose  . albuterol (PROVENTIL HFA;VENTOLIN HFA) 108 (90 Base) MCG/ACT inhaler Inhale 2 puffs into the lungs every 6 (six) hours as needed for wheezing or shortness of breath.   Past Month at Unknown time  . albuterol (PROVENTIL) (2.5 MG/3ML) 0.083% nebulizer solution Take 3 mLs (2.5 mg total) by nebulization every 6 (six) hours as needed for wheezing. 75 mL 12 Past Month at Unknown time  . atorvastatin  (LIPITOR) 40 MG tablet Take 40 mg by mouth daily.   08/03/2017 at Unknown time  . cycloSPORINE (RESTASIS) 0.05 % ophthalmic emulsion Place 1 drop into both eyes daily.   Past Month at Unknown time  . esomeprazole (NEXIUM) 40 MG capsule Take 1 capsule (40 mg total) by mouth 2 (two) times daily before a meal. 60 capsule 11 08/03/2017 at Unknown time  . furosemide (LASIX) 40 MG tablet Take 1 tablet (40 mg total) by mouth daily. 30 tablet 0 08/03/2017 at Unknown time  . gabapentin (NEURONTIN) 300 MG capsule Take 300 mg by mouth 3 (three) times daily.   08/03/2017 at Unknown time  . hydrocortisone 1 % ointment Apply 1 application topically 2 (two) times daily. 30 g prn unknown  . insulin NPH-regular Human (NOVOLIN 70/30) (70-30) 100 UNIT/ML injection Inject 35-45 Units into the skin 3 (three) times daily. 35 units in the morning at breakffast and 35 units in the evening with supper and 45 units at bedtime   08/03/2017 at Unknown time  . lisinopril (PRINIVIL,ZESTRIL) 20 MG tablet Take 1 tablet (20 mg total) by mouth daily. 30 tablet 0 08/03/2017 at Unknown time  . metFORMIN (GLUCOPHAGE) 1000 MG tablet Take 1 tablet (1,000 mg total) by mouth 2 (two) times daily with a  meal. 60 tablet 0 08/03/2017 at Unknown time  . montelukast (SINGULAIR) 10 MG tablet Take 10 mg by mouth at bedtime.    08/03/2017 at Unknown time  . naproxen (NAPROSYN) 500 MG tablet Take 1 tablet (500 mg total) by mouth 2 (two) times daily with a meal. 60 tablet 5 unknown  . naproxen (NAPROSYN) 500 MG tablet Take 1 tablet (500 mg total) by mouth 2 (two) times daily with a meal. 60 tablet 5 unknown  . nystatin (MYCOSTATIN) 100000 UNIT/ML suspension Take 5 mLs (500,000 Units total) by mouth 4 (four) times daily. 473 mL 0 unknown  . ondansetron (ZOFRAN ODT) 4 MG disintegrating tablet Take 4 mg by mouth every 8 (eight) hours as needed for nausea or vomiting.    08/03/2017 at Unknown time  . potassium chloride (K-DUR) 10 MEQ tablet Take 10 mEq by  mouth every evening.    08/03/2017 at Unknown time  . rosuvastatin (CRESTOR) 40 MG tablet Take 1 tablet (40 mg total) by mouth daily. 30 tablet 0 08/03/2017 at Unknown time  . sitaGLIPtin (JANUVIA) 100 MG tablet Take 100 mg by mouth daily.   08/03/2017 at Unknown time  . traMADol (ULTRAM) 50 MG tablet Take 50 mg by mouth every 6 (six) hours as needed for moderate pain.   08/03/2017 at Unknown time  . Vitamin D, Ergocalciferol, (DRISDOL) 50000 units CAPS capsule Take 50,000 Units by mouth every 7 (seven) days.   08/03/2017 at Unknown time  . linaclotide (LINZESS) 290 MCG CAPS capsule Take 1 capsule (290 mcg total) by mouth daily before breakfast. 90 capsule 3 Taking   Assessment: 51yo female with PE.  Pt was given Lovenox 1mg /Kg x 1 last evening.  Now asked to start IV Heparin.  Pt is obese.  Heparin level is therapeutic.    Goal of Therapy:  Heparin level 0.3-0.7 units/ml Monitor platelets by anticoagulation protocol: Yes   Plan:   Continue Heparin infusion at 1500 units/hr   Heparin level daily  CBC daily while on Heparin  Hart Robinsons, PharmD Clinical Pharmacist Pager:  [phone] 08/04/2017   Hart Robinsons A 08/04/2017,9:54 PM

## 2017-08-04 NOTE — Progress Notes (Signed)
*  PRELIMINARY RESULTS* Echocardiogram 2D Echocardiogram has been performed.  Samuel Germany 08/04/2017, 10:52 AM

## 2017-08-04 NOTE — Progress Notes (Signed)
ANTICOAGULATION CONSULT NOTE - Initial Consult  Pharmacy Consult for HEPARIN Indication: pulmonary embolus  No Known Allergies  Patient Measurements: Height: 5\' 6"  (167.6 cm) Weight: 259 lb 0.6 oz (117.5 kg) IBW/kg (Calculated) : 59.3 HEPARIN DW (KG): 87.1  Vital Signs: Temp: 98 F (36.7 C) (11/24 0544) Temp Source: Oral (11/24 0544) BP: 141/92 (11/24 0544) Pulse Rate: 71 (11/24 0544)  Labs: Recent Labs    08/03/17 1930  HGB 14.6  HCT 44.1  PLT 238  CREATININE 0.80   Estimated Creatinine Clearance: 108.5 mL/min (by C-G formula based on SCr of 0.8 mg/dL).  Medical History: Past Medical History:  Diagnosis Date  . Anemia   . Anxiety   . Arthritis   . Asthma   . Autonomic neuropathy   . Constipation   . COPD (chronic obstructive pulmonary disease) ([place])   . CTS (carpal tunnel syndrome)   . Depression   . Diabetes mellitus without complication ([place])   . Endometrial cancer ([place]) 2017  . GERD (gastroesophageal reflux disease)   . Headache(784.0)   . HTN (hypertension)   . Hypercholesterolemia   . Hypothyroidism   . IBS (irritable bowel syndrome)   . Osteoarthritis    osteoarthritis  . Pancreatitis    per patient   . Recurrent boils    buttocks and low back.  . Shortness of breath   . Sleep apnea    CPAP machine  . Uterine cancer ([place]) 2017  . Vitamin D deficiency    Medications:  Medications Prior to Admission  Medication Sig Dispense Refill Last Dose  . albuterol (PROVENTIL HFA;VENTOLIN HFA) 108 (90 Base) MCG/ACT inhaler Inhale 2 puffs into the lungs every 6 (six) hours as needed for wheezing or shortness of breath.   Past Month at Unknown time  . albuterol (PROVENTIL) (2.5 MG/3ML) 0.083% nebulizer solution Take 3 mLs (2.5 mg total) by nebulization every 6 (six) hours as needed for wheezing. 75 mL 12 Past Month at Unknown time  . atorvastatin (LIPITOR) 40 MG tablet Take 40 mg by mouth daily.   08/03/2017 at Unknown time  . cycloSPORINE (RESTASIS) 0.05 %  ophthalmic emulsion Place 1 drop into both eyes daily.   Past Month at Unknown time  . esomeprazole (NEXIUM) 40 MG capsule Take 1 capsule (40 mg total) by mouth 2 (two) times daily before a meal. 60 capsule 11 08/03/2017 at Unknown time  . furosemide (LASIX) 40 MG tablet Take 1 tablet (40 mg total) by mouth daily. 30 tablet 0 08/03/2017 at Unknown time  . gabapentin (NEURONTIN) 300 MG capsule Take 300 mg by mouth 3 (three) times daily.   08/03/2017 at Unknown time  . hydrocortisone 1 % ointment Apply 1 application topically 2 (two) times daily. 30 g prn unknown  . insulin NPH-regular Human (NOVOLIN 70/30) (70-30) 100 UNIT/ML injection Inject 35-45 Units into the skin 3 (three) times daily. 35 units in the morning at breakffast and 35 units in the evening with supper and 45 units at bedtime   08/03/2017 at Unknown time  . lisinopril (PRINIVIL,ZESTRIL) 20 MG tablet Take 1 tablet (20 mg total) by mouth daily. 30 tablet 0 08/03/2017 at Unknown time  . metFORMIN (GLUCOPHAGE) 1000 MG tablet Take 1 tablet (1,000 mg total) by mouth 2 (two) times daily with a meal. 60 tablet 0 08/03/2017 at Unknown time  . montelukast (SINGULAIR) 10 MG tablet Take 10 mg by mouth at bedtime.    08/03/2017 at Unknown time  . naproxen (NAPROSYN) 500 MG tablet  Take 1 tablet (500 mg total) by mouth 2 (two) times daily with a meal. 60 tablet 5 unknown  . naproxen (NAPROSYN) 500 MG tablet Take 1 tablet (500 mg total) by mouth 2 (two) times daily with a meal. 60 tablet 5 unknown  . nystatin (MYCOSTATIN) 100000 UNIT/ML suspension Take 5 mLs (500,000 Units total) by mouth 4 (four) times daily. 473 mL 0 unknown  . ondansetron (ZOFRAN ODT) 4 MG disintegrating tablet Take 4 mg by mouth every 8 (eight) hours as needed for nausea or vomiting.    08/03/2017 at Unknown time  . potassium chloride (K-DUR) 10 MEQ tablet Take 10 mEq by mouth every evening.    08/03/2017 at Unknown time  . rosuvastatin (CRESTOR) 40 MG tablet Take 1 tablet (40 mg  total) by mouth daily. 30 tablet 0 08/03/2017 at Unknown time  . sitaGLIPtin (JANUVIA) 100 MG tablet Take 100 mg by mouth daily.   08/03/2017 at Unknown time  . traMADol (ULTRAM) 50 MG tablet Take 50 mg by mouth every 6 (six) hours as needed for moderate pain.   08/03/2017 at Unknown time  . Vitamin D, Ergocalciferol, (DRISDOL) 50000 units CAPS capsule Take 50,000 Units by mouth every 7 (seven) days.   08/03/2017 at Unknown time  . linaclotide (LINZESS) 290 MCG CAPS capsule Take 1 capsule (290 mcg total) by mouth daily before breakfast. 90 capsule 3 Taking   Assessment: 51yo female with PE.  Pt was given Lovenox 1mg /Kg x 1 last evening.  Now asked to start IV Heparin.  Pt is obese.    Goal of Therapy:  Heparin level 0.3-0.7 units/ml Monitor platelets by anticoagulation protocol: Yes   Plan:   Heparin 4000 units IV now x 1  Heparin infusion at 1500 units/hr (per adjusted BW dosing)  Heparin level in 6-8 hrs then daily  CBC daily while on Heparin  Hart Robinsons, PharmD Clinical Pharmacist Pager:  [phone] 08/04/2017   Hart Robinsons A 08/04/2017,8:03 AM

## 2017-08-04 NOTE — Progress Notes (Signed)
PROGRESS NOTE  Sonya James LZJ:[id number] DOB: Apr 01, 1966 DOA: 08/03/2017 PCP: Rogers Blocker, MD  Brief History:  51 year old female with a history of COPD, tobacco abuse, diabetes mellitus, endometrial carcinoma status post hysterectomy, hypertension, hyperlipidemia, irritable bowel syndrome presents with 3-4-week history of pleuritic type chest discomfort and a nonproductive cough.  Patient states that she has had some bilateral lower extremity pain during the same period of time.  She denies any recent surgeries, long automobile or airplane trips, or new medications.  She denies any personal or family history of VTE.  She denies any shortness of breath, nausea, vomiting, diarrhea, abdominal pain, dysuria, hematuria, headache, neck pain, syncope.  CT angiogram chest showed pulmonary emboli in the bilateral upper and lower lobe segmental and subsegmental branches.  The patient was given a dose of Lovenox and started on intravenous heparin thereafter.  Assessment/Plan: Pulmonary embolus -Unprovoked by clinical history -Check factor V Leiden, prothrombin gene mutation, lupus anticoagulant -Echocardiogram -Duplex lower extremities -August 03, 2017--CTA chest--bilateral UL & LL segmental and subsegmental emboli without R-heart strain -Start heparin drip  Diabetes mellitus type 2, uncontrolled -Patient endorses dietary indiscretion -Hemoglobin A1c -Continue 70/30 insulin -Holding Januvia and metformin  COPD/tobacco abuse -Tobacco cessation discussed  Hyperlipidemia -Continue statin  Hypokalemia -Replete -Check magnesium  Hypercalcemia -Intact PTH -Holding Drisdol -IV fluids -25 vitamin D  Diabetic neuropathy -Continue gabapentin  Morbid Obesity -BMI 41.8 -lifestyle modification    Disposition Plan:   Home in 1-2 days  Family Communication:   Spouse updated at bedside  Consultants:  none  Code Status:  FULL   DVT Prophylaxis:  IV Heparin     Procedures: As Listed in Progress Note Above  Antibiotics: None    Subjective: Patient denies fevers, chills, headache, chest pain, dyspnea, nausea, vomiting, diarrhea, abdominal pain, dysuria, hematuria, hematochezia, and melena.   Objective: Vitals:   08/03/17 2344 08/04/17 0051 08/04/17 0117 08/04/17 0544  BP: (!) 158/87   (!) 141/92  Pulse: 88  76 71  Resp: 20  16   Temp: 98.7 F (37.1 C)   98 F (36.7 C)  TempSrc: Oral   Oral  SpO2:  98% 98% 96%  Weight: 117.5 kg (259 lb 0.6 oz)     Height: 5\' 6"  (1.676 m)      No intake or output data in the 24 hours ending 08/04/17 0849 Weight change:  Exam:   General:  Pt is alert, follows commands appropriately, not in acute distress  HEENT: No icterus, No thrush, No neck mass, [place]/AT  Cardiovascular: RRR, S1/S2, no rubs, no gallops  Respiratory: Bibasilar crackles but no wheezing.  Good air movement  Abdomen: Soft/+BS, non tender, non distended, no guarding  Extremities: No edema, No lymphangitis, No petechiae, No rashes, no synovitis   Data Reviewed: I have personally reviewed following labs and imaging studies Basic Metabolic Panel: Recent Labs  Lab 08/03/17 1930 08/04/17 0759  NA 137 134*  K 3.3* 3.8  CL 101 101  CO2 26 25  GLUCOSE 369* 449*  BUN 8 8  CREATININE 0.80 0.78  CALCIUM 10.7* 9.7  MG 1.5* 1.9   Liver Function Tests: No results for input(s): AST, ALT, ALKPHOS, BILITOT, PROT, ALBUMIN in the last 168 hours. No results for input(s): LIPASE, AMYLASE in the last 168 hours. No results for input(s): AMMONIA in the last 168 hours. Coagulation Profile: No results for input(s): INR, PROTIME in the last 168 hours. CBC: Recent Labs  Lab 08/03/17 1930 08/04/17 0759  WBC 9.3 7.8  NEUTROABS 6.0  --   HGB 14.6 12.7  HCT 44.1 39.4  MCV 93.4 94.3  PLT 238 214   Cardiac Enzymes: No results for input(s): CKTOTAL, CKMB, CKMBINDEX, TROPONINI in the last 168 hours. BNP: Invalid input(s):  POCBNP CBG: Recent Labs  Lab 08/03/17 1754 08/04/17 0001 08/04/17 0809  GLUCAP 404* 419* 447*   HbA1C: No results for input(s): HGBA1C in the last 72 hours. Urine analysis:    Component Value Date/Time   COLORURINE YELLOW 12/23/2014 1501   APPEARANCEUR CLEAR 12/23/2014 1501   LABSPEC 1.015 12/23/2014 1501   PHURINE 5.5 12/23/2014 1501   GLUCOSEU >1000 (A) 12/23/2014 1501   HGBUR MODERATE (A) 12/23/2014 1501   BILIRUBINUR NEGATIVE 12/23/2014 1501   KETONESUR NEGATIVE 12/23/2014 1501   PROTEINUR 30 (A) 12/23/2014 1501   UROBILINOGEN 0.2 12/23/2014 1501   NITRITE NEGATIVE 12/23/2014 1501   LEUKOCYTESUR NEGATIVE 12/23/2014 1501   Sepsis Labs: @LABRCNTIP (procalcitonin:4,lacticidven:4) )No results found for this or any previous visit (from the past 240 hour(s)).   Scheduled Meds: . atorvastatin  40 mg Oral Daily  . cycloSPORINE  1 drop Both Eyes Daily  . gabapentin  300 mg Oral TID  . heparin  4,000 Units Intravenous Once  . insulin aspart  0-20 Units Subcutaneous TID WC  . insulin aspart  0-5 Units Subcutaneous QHS  . insulin aspart protamine- aspart  35 Units Subcutaneous Q breakfast  . insulin aspart protamine- aspart  35 Units Subcutaneous Q supper  . insulin aspart protamine- aspart  45 Units Subcutaneous QHS  . ipratropium-albuterol  3 mL Nebulization Q6H  . lisinopril  20 mg Oral Daily  . montelukast  10 mg Oral QHS  . pantoprazole  40 mg Oral Daily  . potassium chloride  10 mEq Oral QPM  . [START ON 08/10/2017] Vitamin D (Ergocalciferol)  50,000 Units Oral Q7 days   Continuous Infusions: . heparin    . magnesium sulfate 1 - 4 g bolus IVPB Stopped (08/04/17 0007)    Procedures/Studies: Dg Chest 2 View  Result Date: 08/03/2017 CLINICAL DATA:  Chest pain. EXAM: CHEST  2 VIEW COMPARISON:  Radiograph of July 04, 2014. FINDINGS: The heart size and mediastinal contours are within normal limits. Both lungs are clear. No pneumothorax or pleural effusion is noted.  The visualized skeletal structures are unremarkable. IMPRESSION: No active cardiopulmonary disease. Electronically Signed   By: Marijo Conception, M.D.   On: 08/03/2017 16:43   Ct Angio Chest Pe W And/or Wo Contrast  Result Date: 08/03/2017 CLINICAL DATA:  Shortness of breath and weakness for 2 weeks. Right sharp pain under the breast for 2 weeks. Positive D-dimer. EXAM: CT ANGIOGRAPHY CHEST WITH CONTRAST TECHNIQUE: Multidetector CT imaging of the chest was performed using the standard protocol during bolus administration of intravenous contrast. Multiplanar CT image reconstructions and MIPs were obtained to evaluate the vascular anatomy. CONTRAST:  124mL ISOVUE-370 IOPAMIDOL (ISOVUE-370) INJECTION 76% COMPARISON:  None. FINDINGS: Cardiovascular: Moderately good opacification of the central and segmental pulmonary arteries. Filling defects are demonstrated in bilateral upper and lower lobe segmental and subsegmental branches. Appearance is consistent with acute pulmonary embolus. RV to LV ratio is 0.9. Normal heart size. No pericardial effusion. Normal caliber thoracic aorta. No aortic dissection. Great vessel origins are patent. Mediastinum/Nodes: Scattered mediastinal lymph nodes are not pathologically enlarged. Esophagus is decompressed. Lungs/Pleura: Motion artifact limits evaluation. Linear areas of atelectasis in the lung bases. No focal consolidation. No pleural  effusions. No pneumothorax. Airways are patent. Upper Abdomen: No acute abnormality. Musculoskeletal: No chest wall abnormality. No acute or significant osseous findings. Review of the MIP images confirms the above findings. IMPRESSION: 1. Multiple bilateral segmental and subsegmental pulmonary emboli. No evidence of right heart strain. 2. Atelectasis in the lung bases. No evidence of active pulmonary disease. These results were called by telephone at the time of interpretation on 08/03/2017 at 9:19 pm to Dr. Gilford Raid, who verbally acknowledged  these results. Electronically Signed   By: Lucienne Capers M.D.   On: 08/03/2017 21:21    Sharonann Malbrough, DO  [facility] Pager [phone]  If 7PM-7AM, please contact night-coverage [email or web address] Password [email or web address] 08/04/2017, 8:49 AM   LOS: 1 day

## 2017-08-05 DIAGNOSIS — I10 Essential (primary) hypertension: Secondary | ICD-10-CM

## 2017-08-05 LAB — PARATHYROID HORMONE, INTACT (NO CA): PTH: 32 pg/mL (ref 15–65)

## 2017-08-05 LAB — CBC
HCT: 41 % (ref 36.0–46.0)
Hemoglobin: 13 g/dL (ref 12.0–15.0)
MCH: 30 pg (ref 26.0–34.0)
MCHC: 31.7 g/dL (ref 30.0–36.0)
MCV: 94.7 fL (ref 78.0–100.0)
PLATELETS: 251 10*3/uL (ref 150–400)
RBC: 4.33 MIL/uL (ref 3.87–5.11)
RDW: 12.6 % (ref 11.5–15.5)
WBC: 9.2 10*3/uL (ref 4.0–10.5)

## 2017-08-05 LAB — GLUCOSE, CAPILLARY
Glucose-Capillary: 262 mg/dL — ABNORMAL HIGH (ref 65–99)
Glucose-Capillary: 311 mg/dL — ABNORMAL HIGH (ref 65–99)
Glucose-Capillary: 288 mg/dL — ABNORMAL HIGH (ref 65–99)
Glucose-Capillary: 305 mg/dL — ABNORMAL HIGH (ref 65–99)

## 2017-08-05 LAB — HEPARIN LEVEL (UNFRACTIONATED): HEPARIN UNFRACTIONATED: 0.49 [IU]/mL (ref 0.30–0.70)

## 2017-08-05 LAB — BASIC METABOLIC PANEL
Anion gap: 7 (ref 5–15)
BUN: 9 mg/dL (ref 6–20)
CALCIUM: 9.7 mg/dL (ref 8.9–10.3)
CO2: 26 mmol/L (ref 22–32)
CREATININE: 0.67 mg/dL (ref 0.44–1.00)
Chloride: 106 mmol/L (ref 101–111)
GFR calc Af Amer: >60 mL/min (ref >60)
GLUCOSE: 236 mg/dL — AB (ref 65–99)
Potassium: 3.7 mmol/L (ref 3.5–5.1)
Sodium: 139 mmol/L (ref 135–145)

## 2017-08-05 LAB — HEMOGLOBIN A1C
HEMOGLOBIN A1C: 14.1 % — AB (ref 4.8–5.6)
MEAN PLASMA GLUCOSE: 358 mg/dL

## 2017-08-05 LAB — HIV ANTIBODY (ROUTINE TESTING W REFLEX): HIV Screen 4th Generation wRfx: NONREACTIVE

## 2017-08-05 MED ORDER — APIXABAN 5 MG PO TABS: 5.0000 mg | ORAL_TABLET | Freq: Two times a day (BID) | ORAL | Status: DC

## 2017-08-05 MED ORDER — DOXYCYCLINE HYCLATE 100 MG PO TABS
100.0000 mg | ORAL_TABLET | Freq: Two times a day (BID) | ORAL | Status: DC
  Administered 2017-08-05 – 2017-08-06 (×3): 100 mg via ORAL
  Filled 2017-08-05 (×3): qty 1

## 2017-08-05 MED ORDER — INSULIN ASPART PROT & ASPART (70-30 MIX) 100 UNIT/ML ~~LOC~~ SUSP
40.0000 [IU] | Freq: Every day | SUBCUTANEOUS | Status: DC
  Administered 2017-08-05: 40 [IU] via SUBCUTANEOUS

## 2017-08-05 MED ORDER — INSULIN ASPART PROT & ASPART (70-30 MIX) 100 UNIT/ML ~~LOC~~ SUSP
40.0000 [IU] | Freq: Every day | SUBCUTANEOUS | Status: DC
  Administered 2017-08-06: 40 [IU] via SUBCUTANEOUS
  Filled 2017-08-05: qty 10

## 2017-08-05 MED ORDER — APIXABAN 5 MG PO TABS
10.0000 mg | ORAL_TABLET | Freq: Two times a day (BID) | ORAL | Status: DC
  Administered 2017-08-05 – 2017-08-06 (×2): 10 mg via ORAL
  Filled 2017-08-05 (×2): qty 2

## 2017-08-05 NOTE — Discharge Summary (Signed)
Physician Discharge Summary  Sonya James PIR:[id number] DOB: 16-Feb-1966 DOA: 08/03/2017  PCP: Rogers Blocker, MD  Admit date: 08/03/2017 Discharge date: 08/06/2017  Admitted From: Home Disposition:  Home   Recommendations for Outpatient Follow-up:  1. Follow up with PCP in 1-2 weeks 2. Please obtain BMP/CBC in one week   Discharge Condition: Stable CODE STATUS: FULL Diet recommendation: Heart Healthy / Carb Modified   Brief/Interim Summary: 51 year old female with a history of COPD, tobacco abuse, diabetes mellitus, endometrial carcinoma status post hysterectomy, hypertension, hyperlipidemia, irritable bowel syndrome presents with 3-4-week history of pleuritic type chest discomfort and a nonproductive cough. Patient states that she has had some bilateral lower extremity pain during the same period of time. She denies any recent surgeries, long automobile or airplane trips, or new medications. She denies any personal or family history of VTE.She denies any shortness of breath, nausea, vomiting, diarrhea, abdominal pain, dysuria, hematuria, headache, neck pain, syncope. CT angiogram chest showed pulmonary emboli in the bilateral upper and lower lobe segmental and subsegmental branches. The patient was given a dose of Lovenox and started on intravenous heparin thereafter.    Discharge Diagnoses:  Pulmonary embolus -Unprovoked by clinical history -Check factor V Leiden, prothrombin gene mutation, lupus anticoagulant--results pending at time of discharge -Echocardiogram--EF 65-70%, no WMA, normal RV -Duplex lower extremities-negative for  DVT -August 03, 2017--CTA chest--bilateral UL & LL segmental and subsegmental emboli without R-heart strain -Continue heparin drip-->transitioned to oral apixaban  Diabetes mellitus type 2, uncontrolled -Patient endorses dietary indiscretion -Hemoglobin A1c--14.11 -Continue 70/30 insulin--increase to 50 units--50 units--50 units  (breakfast, supper, hs) -Holding Januvia and metformin--restart after d/c  COPD/tobacco abuse -Tobacco cessation discussed  Left leg furuncle -doxycycline x 7 days  Hyperlipidemia -Continue statin  Hypokalemia -Repleted -mag--1.9  Hypercalcemia -Intact PTH--32 -due to non-parathyroid mediated etiology -Holding Drisdol -IV fluids-->improved -25 vitamin D--pending  Diabetic neuropathy -Continue gabapentin  Morbid Obesity -BMI 41.8 -lifestyle modification  Essential hypertension -Continue lisinopril      Discharge Instructions  Discharge Instructions    Diet - low sodium heart healthy   Complete by:  As directed    Increase activity slowly   Complete by:  As directed      Allergies as of 08/06/2017   No Known Allergies     Medication List    STOP taking these medications   insulin NPH-regular Human (70-30) 100 UNIT/ML injection Commonly known as:  NOVOLIN 70/30 Replaced by:  insulin aspart protamine- aspart (70-30) 100 UNIT/ML injection     TAKE these medications   albuterol 108 (90 Base) MCG/ACT inhaler Commonly known as:  PROVENTIL HFA;VENTOLIN HFA Inhale 2 puffs into the lungs every 6 (six) hours as needed for wheezing or shortness of breath.   albuterol (2.5 MG/3ML) 0.083% nebulizer solution Commonly known as:  PROVENTIL Take 3 mLs (2.5 mg total) by nebulization every 6 (six) hours as needed for wheezing.   apixaban 5 MG Tabs tablet Commonly known as:  ELIQUIS Take 2 tablets (10 mg total) by mouth 2 (two) times daily. On 08/12/17 @ 9PM, start 1 tab (5 mg) two times daily.   atorvastatin 40 MG tablet Commonly known as:  LIPITOR Take 40 mg by mouth daily.   cycloSPORINE 0.05 % ophthalmic emulsion Commonly known as:  RESTASIS Place 1 drop into both eyes daily.   doxycycline 100 MG tablet Commonly known as:  VIBRA-TABS Take 1 tablet (100 mg total) by mouth every 12 (twelve) hours.   esomeprazole 40 MG capsule Commonly known  as:   NEXIUM Take 1 capsule (40 mg total) by mouth 2 (two) times daily before a meal.   furosemide 40 MG tablet Commonly known as:  LASIX Take 1 tablet (40 mg total) by mouth daily.   gabapentin 300 MG capsule Commonly known as:  NEURONTIN Take 300 mg by mouth 3 (three) times daily.   hydrocortisone 1 % ointment Apply 1 application topically 2 (two) times daily.   insulin aspart protamine- aspart (70-30) 100 UNIT/ML injection Commonly known as:  NOVOLOG MIX 70/30 Inject 0.5 mLs (50 Units total) into the skin daily with supper.   insulin aspart protamine- aspart (70-30) 100 UNIT/ML injection Commonly known as:  NOVOLOG MIX 70/30 Inject 0.5 mLs (50 Units total) into the skin at bedtime.   insulin aspart protamine- aspart (70-30) 100 UNIT/ML injection Commonly known as:  NOVOLOG MIX 70/30 Inject 0.5 mLs (50 Units total) into the skin daily with breakfast. Start taking on:  08/07/2017 Replaces:  insulin NPH-regular Human (70-30) 100 UNIT/ML injection   linaclotide 290 MCG Caps capsule Commonly known as:  LINZESS Take 1 capsule (290 mcg total) by mouth daily before breakfast.   lisinopril 20 MG tablet Commonly known as:  PRINIVIL,ZESTRIL Take 1 tablet (20 mg total) by mouth daily.   metFORMIN 1000 MG tablet Commonly known as:  GLUCOPHAGE Take 1 tablet (1,000 mg total) by mouth 2 (two) times daily with a meal.   montelukast 10 MG tablet Commonly known as:  SINGULAIR Take 10 mg by mouth at bedtime.   naproxen 500 MG tablet Commonly known as:  NAPROSYN Take 1 tablet (500 mg total) by mouth 2 (two) times daily with a meal. What changed:  Another medication with the same name was removed. Continue taking this medication, and follow the directions you see here.   nystatin 100000 UNIT/ML suspension Commonly known as:  MYCOSTATIN Take 5 mLs (500,000 Units total) by mouth 4 (four) times daily.   potassium chloride 10 MEQ tablet Commonly known as:  K-DUR Take 10 mEq by mouth every  evening.   rosuvastatin 40 MG tablet Commonly known as:  CRESTOR Take 1 tablet (40 mg total) by mouth daily.   sitaGLIPtin 100 MG tablet Commonly known as:  JANUVIA Take 100 mg by mouth daily.   traMADol 50 MG tablet Commonly known as:  ULTRAM Take 50 mg by mouth every 6 (six) hours as needed for moderate pain.   Vitamin D (Ergocalciferol) 50000 units Caps capsule Commonly known as:  DRISDOL Take 50,000 Units by mouth every 7 (seven) days.   ZOFRAN ODT 4 MG disintegrating tablet Generic drug:  ondansetron Take 4 mg by mouth every 8 (eight) hours as needed for nausea or vomiting.       No Known Allergies  Consultations:  none   Procedures/Studies: Dg Chest 2 View  Result Date: 08/03/2017 CLINICAL DATA:  Chest pain. EXAM: CHEST  2 VIEW COMPARISON:  Radiograph of July 04, 2014. FINDINGS: The heart size and mediastinal contours are within normal limits. Both lungs are clear. No pneumothorax or pleural effusion is noted. The visualized skeletal structures are unremarkable. IMPRESSION: No active cardiopulmonary disease. Electronically Signed   By: Marijo Conception, M.D.   On: 08/03/2017 16:43   Ct Angio Chest Pe W And/or Wo Contrast  Result Date: 08/03/2017 CLINICAL DATA:  Shortness of breath and weakness for 2 weeks. Right sharp pain under the breast for 2 weeks. Positive D-dimer. EXAM: CT ANGIOGRAPHY CHEST WITH CONTRAST TECHNIQUE: Multidetector CT imaging of the  chest was performed using the standard protocol during bolus administration of intravenous contrast. Multiplanar CT image reconstructions and MIPs were obtained to evaluate the vascular anatomy. CONTRAST:  148mL ISOVUE-370 IOPAMIDOL (ISOVUE-370) INJECTION 76% COMPARISON:  None. FINDINGS: Cardiovascular: Moderately good opacification of the central and segmental pulmonary arteries. Filling defects are demonstrated in bilateral upper and lower lobe segmental and subsegmental branches. Appearance is consistent with acute  pulmonary embolus. RV to LV ratio is 0.9. Normal heart size. No pericardial effusion. Normal caliber thoracic aorta. No aortic dissection. Great vessel origins are patent. Mediastinum/Nodes: Scattered mediastinal lymph nodes are not pathologically enlarged. Esophagus is decompressed. Lungs/Pleura: Motion artifact limits evaluation. Linear areas of atelectasis in the lung bases. No focal consolidation. No pleural effusions. No pneumothorax. Airways are patent. Upper Abdomen: No acute abnormality. Musculoskeletal: No chest wall abnormality. No acute or significant osseous findings. Review of the MIP images confirms the above findings. IMPRESSION: 1. Multiple bilateral segmental and subsegmental pulmonary emboli. No evidence of right heart strain. 2. Atelectasis in the lung bases. No evidence of active pulmonary disease. These results were called by telephone at the time of interpretation on 08/03/2017 at 9:19 pm to Dr. Gilford Raid, who verbally acknowledged these results. Electronically Signed   By: Lucienne Capers M.D.   On: 08/03/2017 21:21   US Venous Img Lower Bilateral  Result Date: 08/04/2017 CLINICAL DATA:  51 year old female with a history of pulmonary embolism EXAM: BILATERAL LOWER EXTREMITY VENOUS DOPPLER ULTRASOUND TECHNIQUE: Gray-scale sonography with graded compression, as well as color Doppler and duplex ultrasound were performed to evaluate the lower extremity deep venous systems from the level of the common femoral vein and including the common femoral, femoral, profunda femoral, popliteal and calf veins including the posterior tibial, peroneal and gastrocnemius veins when visible. The superficial great saphenous vein was also interrogated. Spectral Doppler was utilized to evaluate flow at rest and with distal augmentation maneuvers in the common femoral, femoral and popliteal veins. COMPARISON:  None. FINDINGS: RIGHT LOWER EXTREMITY Common Femoral Vein: No evidence of thrombus. Normal  compressibility, respiratory phasicity and response to augmentation. Saphenofemoral Junction: No evidence of thrombus. Normal compressibility and flow on color Doppler imaging. Profunda Femoral Vein: No evidence of thrombus. Normal compressibility and flow on color Doppler imaging. Femoral Vein: No evidence of thrombus. Normal compressibility, respiratory phasicity and response to augmentation. Popliteal Vein: No evidence of thrombus. Normal compressibility, respiratory phasicity and response to augmentation. Calf Veins: No evidence of thrombus. Normal compressibility and flow on color Doppler imaging. Superficial Great Saphenous Vein: No evidence of thrombus. Normal compressibility and flow on color Doppler imaging. Other Findings:  None. LEFT LOWER EXTREMITY Common Femoral Vein: No evidence of thrombus. Normal compressibility, respiratory phasicity and response to augmentation. Saphenofemoral Junction: No evidence of thrombus. Normal compressibility and flow on color Doppler imaging. Profunda Femoral Vein: No evidence of thrombus. Normal compressibility and flow on color Doppler imaging. Femoral Vein: No evidence of thrombus. Normal compressibility, respiratory phasicity and response to augmentation. Popliteal Vein: No evidence of thrombus. Normal compressibility, respiratory phasicity and response to augmentation. Calf Veins: No evidence of thrombus. Normal compressibility and flow on color Doppler imaging. Superficial Great Saphenous Vein: No evidence of thrombus. Normal compressibility and flow on color Doppler imaging. Other Findings: Small cystic structure and a left popliteal fossa, likely a Baker's cyst. IMPRESSION: Sonographic survey of the bilateral lower extremities negative for DVT. Likely left Baker cyst Electronically Signed   By: Corrie Mckusick D.O.   On: 08/04/2017 15:56  Discharge Exam: Vitals:   08/06/17 0503 08/06/17 0825  BP: 136/71   Pulse: 72   Resp: 16   Temp: 98.3 F (36.8  C)   SpO2: 96% 98%   Vitals:   08/05/17 2100 08/05/17 2151 08/06/17 0503 08/06/17 0825  BP: (!) 149/91  136/71   Pulse: 80  72   Resp: 18  16   Temp: 98.6 F (37 C)  98.3 F (36.8 C)   TempSrc: Oral  Oral   SpO2: 94% 94% 96% 98%  Weight:      Height:        General: Pt is alert, awake, not in acute distress Cardiovascular: RRR, S1/S2 +, no rubs, no gallops Respiratory: CTA bilaterally, no wheezing, no rhonchi Abdominal: Soft, NT, ND, bowel sounds + Extremities: no edema, no cyanosis   The results of significant diagnostics from this hospitalization (including imaging, microbiology, ancillary and laboratory) are listed below for reference.    Significant Diagnostic Studies: Dg Chest 2 View  Result Date: 08/03/2017 CLINICAL DATA:  Chest pain. EXAM: CHEST  2 VIEW COMPARISON:  Radiograph of July 04, 2014. FINDINGS: The heart size and mediastinal contours are within normal limits. Both lungs are clear. No pneumothorax or pleural effusion is noted. The visualized skeletal structures are unremarkable. IMPRESSION: No active cardiopulmonary disease. Electronically Signed   By: Marijo Conception, M.D.   On: 08/03/2017 16:43   Ct Angio Chest Pe W And/or Wo Contrast  Result Date: 08/03/2017 CLINICAL DATA:  Shortness of breath and weakness for 2 weeks. Right sharp pain under the breast for 2 weeks. Positive D-dimer. EXAM: CT ANGIOGRAPHY CHEST WITH CONTRAST TECHNIQUE: Multidetector CT imaging of the chest was performed using the standard protocol during bolus administration of intravenous contrast. Multiplanar CT image reconstructions and MIPs were obtained to evaluate the vascular anatomy. CONTRAST:  174mL ISOVUE-370 IOPAMIDOL (ISOVUE-370) INJECTION 76% COMPARISON:  None. FINDINGS: Cardiovascular: Moderately good opacification of the central and segmental pulmonary arteries. Filling defects are demonstrated in bilateral upper and lower lobe segmental and subsegmental branches. Appearance is  consistent with acute pulmonary embolus. RV to LV ratio is 0.9. Normal heart size. No pericardial effusion. Normal caliber thoracic aorta. No aortic dissection. Great vessel origins are patent. Mediastinum/Nodes: Scattered mediastinal lymph nodes are not pathologically enlarged. Esophagus is decompressed. Lungs/Pleura: Motion artifact limits evaluation. Linear areas of atelectasis in the lung bases. No focal consolidation. No pleural effusions. No pneumothorax. Airways are patent. Upper Abdomen: No acute abnormality. Musculoskeletal: No chest wall abnormality. No acute or significant osseous findings. Review of the MIP images confirms the above findings. IMPRESSION: 1. Multiple bilateral segmental and subsegmental pulmonary emboli. No evidence of right heart strain. 2. Atelectasis in the lung bases. No evidence of active pulmonary disease. These results were called by telephone at the time of interpretation on 08/03/2017 at 9:19 pm to Dr. Gilford Raid, who verbally acknowledged these results. Electronically Signed   By: Lucienne Capers M.D.   On: 08/03/2017 21:21   US Venous Img Lower Bilateral  Result Date: 08/04/2017 CLINICAL DATA:  51 year old female with a history of pulmonary embolism EXAM: BILATERAL LOWER EXTREMITY VENOUS DOPPLER ULTRASOUND TECHNIQUE: Gray-scale sonography with graded compression, as well as color Doppler and duplex ultrasound were performed to evaluate the lower extremity deep venous systems from the level of the common femoral vein and including the common femoral, femoral, profunda femoral, popliteal and calf veins including the posterior tibial, peroneal and gastrocnemius veins when visible. The superficial great saphenous vein was also interrogated.  Spectral Doppler was utilized to evaluate flow at rest and with distal augmentation maneuvers in the common femoral, femoral and popliteal veins. COMPARISON:  None. FINDINGS: RIGHT LOWER EXTREMITY Common Femoral Vein: No evidence of  thrombus. Normal compressibility, respiratory phasicity and response to augmentation. Saphenofemoral Junction: No evidence of thrombus. Normal compressibility and flow on color Doppler imaging. Profunda Femoral Vein: No evidence of thrombus. Normal compressibility and flow on color Doppler imaging. Femoral Vein: No evidence of thrombus. Normal compressibility, respiratory phasicity and response to augmentation. Popliteal Vein: No evidence of thrombus. Normal compressibility, respiratory phasicity and response to augmentation. Calf Veins: No evidence of thrombus. Normal compressibility and flow on color Doppler imaging. Superficial Great Saphenous Vein: No evidence of thrombus. Normal compressibility and flow on color Doppler imaging. Other Findings:  None. LEFT LOWER EXTREMITY Common Femoral Vein: No evidence of thrombus. Normal compressibility, respiratory phasicity and response to augmentation. Saphenofemoral Junction: No evidence of thrombus. Normal compressibility and flow on color Doppler imaging. Profunda Femoral Vein: No evidence of thrombus. Normal compressibility and flow on color Doppler imaging. Femoral Vein: No evidence of thrombus. Normal compressibility, respiratory phasicity and response to augmentation. Popliteal Vein: No evidence of thrombus. Normal compressibility, respiratory phasicity and response to augmentation. Calf Veins: No evidence of thrombus. Normal compressibility and flow on color Doppler imaging. Superficial Great Saphenous Vein: No evidence of thrombus. Normal compressibility and flow on color Doppler imaging. Other Findings: Small cystic structure and a left popliteal fossa, likely a Baker's cyst. IMPRESSION: Sonographic survey of the bilateral lower extremities negative for DVT. Likely left Baker cyst Electronically Signed   By: Corrie Mckusick D.O.   On: 08/04/2017 15:56     Microbiology: No results found for this or any previous visit (from the past 240 hour(s)).    Labs: Basic Metabolic Panel: Recent Labs  Lab 08/03/17 1930 08/04/17 0759 08/05/17 0812 08/06/17 0631  NA 137 134* 139 137  K 3.3* 3.8 3.7 3.7  CL 101 101 106 102  CO2 26 25 26 28   GLUCOSE 369* 449* 236* 272*  BUN 8 8 9 8   CREATININE 0.80 0.78 0.67 0.77  CALCIUM 10.7* 9.7 9.7 9.6  MG 1.5* 1.9  --  1.6*   Liver Function Tests: Recent Labs  Lab 08/04/17 0759  AST 17  ALT 13*  ALKPHOS 116  BILITOT 0.4  PROT 6.7  ALBUMIN 3.3*   No results for input(s): LIPASE, AMYLASE in the last 168 hours. No results for input(s): AMMONIA in the last 168 hours. CBC: Recent Labs  Lab 08/03/17 1930 08/04/17 0759 08/05/17 0812  WBC 9.3 7.8 9.2  NEUTROABS 6.0  --   --   HGB 14.6 12.7 13.0  HCT 44.1 39.4 41.0  MCV 93.4 94.3 94.7  PLT 238 214 251   Cardiac Enzymes: No results for input(s): CKTOTAL, CKMB, CKMBINDEX, TROPONINI in the last 168 hours. BNP: Invalid input(s): POCBNP CBG: Recent Labs  Lab 08/05/17 0752 08/05/17 1204 08/05/17 1640 08/05/17 2130 08/06/17 0756  GLUCAP 262* 311* 288* 305* 255*    Time coordinating discharge:  Greater than 30 minutes  Signed:  Zadrian Mccauley, DO [facility] Pager: [phone] 08/06/2017, 10:49 AM

## 2017-08-05 NOTE — Progress Notes (Signed)
PROGRESS NOTE  Sonya James NWG:[id number] DOB: [birth year] DOA: 08/03/2017 PCP: Rogers Blocker, MD  Brief History:  51 year old female with a history of COPD, tobacco abuse, diabetes mellitus, endometrial carcinoma status post hysterectomy, hypertension, hyperlipidemia, irritable bowel syndrome presents with 3-4-week history of pleuritic type chest discomfort and a nonproductive cough.  Patient states that she has had some bilateral lower extremity pain during the same period of time.  She denies any recent surgeries, long automobile or airplane trips, or new medications.  She denies any personal or family history of VTE.  She denies any shortness of breath, nausea, vomiting, diarrhea, abdominal pain, dysuria, hematuria, headache, neck pain, syncope.  CT angiogram chest showed pulmonary emboli in the bilateral upper and lower lobe segmental and subsegmental branches.  The patient was given a dose of Lovenox and started on intravenous heparin thereafter.  Assessment/Plan: Pulmonary embolus -Unprovoked by clinical history -Check factor V Leiden, prothrombin gene mutation, lupus anticoagulant -Echocardiogram--EF 65-70%, no WMA, normal RV -Duplex lower extremities-negative for  DVT -August 03, 2017--CTA chest--bilateral UL & LL segmental and subsegmental emboli without R-heart strain -Continue heparin drip-->transition to oral apixaban  Diabetes mellitus type 2, uncontrolled -Patient endorses dietary indiscretion -Hemoglobin A1c--14.11 -Continue 70/30 insulin--increase to 40units--40 units--45 units -Holding Januvia and metformin  COPD/tobacco abuse -Tobacco cessation discussed  Hyperlipidemia -Continue statin  Hypokalemia -Replete -mag--1.9  Hypercalcemia -Intact PTH--32 -due to non-parathyroid mediated etiology -Holding Drisdol -IV fluids-->improved -25 vitamin D  Diabetic neuropathy -Continue gabapentin  Morbid Obesity -BMI 41.8 -lifestyle  modification  Essential hypertension -Continue lisinopril    Disposition Plan:   Home 11/26 if stable Family Communication:   Spouse updated at bedside  Consultants:  none  Code Status:  FULL   DVT Prophylaxis:  IV Heparin    Procedures: As Listed in Progress Note Above  Antibiotics: None      Subjective: Patient still has some intermittent right-sided pleuritic chest pain but it is better than yesterday.  She denies any shortness breath, nausea, vomiting, diarrhea, abdominal pain pain or dysuria hematuria pedal rashes.  Objective: Vitals:   08/04/17 2217 08/05/17 0519 08/05/17 1421 08/05/17 1509  BP:  130/72 138/72   Pulse: 83 78 78   Resp: 16 18 18    Temp:  99.1 F (37.3 C) 98.9 F (37.2 C)   TempSrc:  Oral Oral   SpO2: 93% 95% 93% 90%  Weight:      Height:        Intake/Output Summary (Last 24 hours) at 08/05/2017 1605 Last data filed at 08/05/2017 0400 Gross per 24 hour  Intake 1050 ml  Output -  Net 1050 ml   Weight change:  Exam:   General:  Pt is alert, follows commands appropriately, not in acute distress  HEENT: No icterus, No thrush, No neck mass, [place]/AT  Cardiovascular: RRR, S1/S2, no rubs, no gallops  Respiratory: Bibasilar rales.  No wheezing.  Good air movement.  Abdomen: Soft/+BS, non tender, non distended, no guarding  Extremities: No edema, No lymphangitis, No petechiae, No rashes, no synovitis   Data Reviewed: I have personally reviewed following labs and imaging studies Basic Metabolic Panel: Recent Labs  Lab 08/03/17 1930 08/04/17 0759 08/05/17 0812  NA 137 134* 139  K 3.3* 3.8 3.7  CL 101 101 106  CO2 26 25 26   GLUCOSE 369* 449* 236*  BUN 8 8 9   CREATININE 0.80 0.78 0.67  CALCIUM 10.7* 9.7 9.7  MG 1.5*  1.9  --    Liver Function Tests: Recent Labs  Lab 08/04/17 0759  AST 17  ALT 13*  ALKPHOS 116  BILITOT 0.4  PROT 6.7  ALBUMIN 3.3*   No results for input(s): LIPASE, AMYLASE in the last 168  hours. No results for input(s): AMMONIA in the last 168 hours. Coagulation Profile: No results for input(s): INR, PROTIME in the last 168 hours. CBC: Recent Labs  Lab 08/03/17 1930 08/04/17 0759 08/05/17 0812  WBC 9.3 7.8 9.2  NEUTROABS 6.0  --   --   HGB 14.6 12.7 13.0  HCT 44.1 39.4 41.0  MCV 93.4 94.3 94.7  PLT 238 214 251   Cardiac Enzymes: No results for input(s): CKTOTAL, CKMB, CKMBINDEX, TROPONINI in the last 168 hours. BNP: Invalid input(s): POCBNP CBG: Recent Labs  Lab 08/04/17 1231 08/04/17 1635 08/04/17 2044 08/05/17 0752 08/05/17 1204  GLUCAP 388* 372* 226* 262* 311*   HbA1C: Recent Labs    08/04/17 0900  HGBA1C 14.1*   Urine analysis:    Component Value Date/Time   COLORURINE YELLOW 12/23/2014 1501   APPEARANCEUR CLEAR 12/23/2014 1501   LABSPEC 1.015 12/23/2014 1501   PHURINE 5.5 12/23/2014 1501   GLUCOSEU >1000 (A) 12/23/2014 1501   HGBUR MODERATE (A) 12/23/2014 1501   BILIRUBINUR NEGATIVE 12/23/2014 1501   KETONESUR NEGATIVE 12/23/2014 1501   PROTEINUR 30 (A) 12/23/2014 1501   UROBILINOGEN 0.2 12/23/2014 1501   NITRITE NEGATIVE 12/23/2014 1501   LEUKOCYTESUR NEGATIVE 12/23/2014 1501   Sepsis Labs: @LABRCNTIP (procalcitonin:4,lacticidven:4) )No results found for this or any previous visit (from the past 240 hour(s)).   Scheduled Meds: . atorvastatin  40 mg Oral Daily  . cycloSPORINE  1 drop Both Eyes Daily  . doxycycline  100 mg Oral Q12H  . gabapentin  300 mg Oral TID  . insulin aspart  0-20 Units Subcutaneous TID WC  . insulin aspart  0-5 Units Subcutaneous QHS  . insulin aspart protamine- aspart  40 Units Subcutaneous Q supper  . [START ON 08/06/2017] insulin aspart protamine- aspart  40 Units Subcutaneous Q breakfast  . insulin aspart protamine- aspart  45 Units Subcutaneous QHS  . ipratropium-albuterol  3 mL Nebulization Q6H  . lisinopril  20 mg Oral Daily  . montelukast  10 mg Oral QHS  . pantoprazole  40 mg Oral Daily  .  potassium chloride  10 mEq Oral QPM  . [START ON 08/10/2017] Vitamin D (Ergocalciferol)  50,000 Units Oral Q7 days   Continuous Infusions: . heparin 1,500 Units/hr (08/05/17 0400)  . magnesium sulfate 1 - 4 g bolus IVPB Stopped (08/04/17 0007)    Procedures/Studies: Dg Chest 2 View  Result Date: 08/03/2017 CLINICAL DATA:  Chest pain. EXAM: CHEST  2 VIEW COMPARISON:  Radiograph of July 04, 2014. FINDINGS: The heart size and mediastinal contours are within normal limits. Both lungs are clear. No pneumothorax or pleural effusion is noted. The visualized skeletal structures are unremarkable. IMPRESSION: No active cardiopulmonary disease. Electronically Signed   By: Marijo Conception, M.D.   On: 08/03/2017 16:43   Ct Angio Chest Pe W And/or Wo Contrast  Result Date: 08/03/2017 CLINICAL DATA:  Shortness of breath and weakness for 2 weeks. Right sharp pain under the breast for 2 weeks. Positive D-dimer. EXAM: CT ANGIOGRAPHY CHEST WITH CONTRAST TECHNIQUE: Multidetector CT imaging of the chest was performed using the standard protocol during bolus administration of intravenous contrast. Multiplanar CT image reconstructions and MIPs were obtained to evaluate the vascular anatomy. CONTRAST:  193mL ISOVUE-370 IOPAMIDOL (ISOVUE-370) INJECTION 76% COMPARISON:  None. FINDINGS: Cardiovascular: Moderately good opacification of the central and segmental pulmonary arteries. Filling defects are demonstrated in bilateral upper and lower lobe segmental and subsegmental branches. Appearance is consistent with acute pulmonary embolus. RV to LV ratio is 0.9. Normal heart size. No pericardial effusion. Normal caliber thoracic aorta. No aortic dissection. Great vessel origins are patent. Mediastinum/Nodes: Scattered mediastinal lymph nodes are not pathologically enlarged. Esophagus is decompressed. Lungs/Pleura: Motion artifact limits evaluation. Linear areas of atelectasis in the lung bases. No focal consolidation. No  pleural effusions. No pneumothorax. Airways are patent. Upper Abdomen: No acute abnormality. Musculoskeletal: No chest wall abnormality. No acute or significant osseous findings. Review of the MIP images confirms the above findings. IMPRESSION: 1. Multiple bilateral segmental and subsegmental pulmonary emboli. No evidence of right heart strain. 2. Atelectasis in the lung bases. No evidence of active pulmonary disease. These results were called by telephone at the time of interpretation on 08/03/2017 at 9:19 pm to Dr. Gilford Raid, who verbally acknowledged these results. Electronically Signed   By: Lucienne Capers M.D.   On: 08/03/2017 21:21   US Venous Img Lower Bilateral  Result Date: 08/04/2017 CLINICAL DATA:  51 year old female with a history of pulmonary embolism EXAM: BILATERAL LOWER EXTREMITY VENOUS DOPPLER ULTRASOUND TECHNIQUE: Gray-scale sonography with graded compression, as well as color Doppler and duplex ultrasound were performed to evaluate the lower extremity deep venous systems from the level of the common femoral vein and including the common femoral, femoral, profunda femoral, popliteal and calf veins including the posterior tibial, peroneal and gastrocnemius veins when visible. The superficial great saphenous vein was also interrogated. Spectral Doppler was utilized to evaluate flow at rest and with distal augmentation maneuvers in the common femoral, femoral and popliteal veins. COMPARISON:  None. FINDINGS: RIGHT LOWER EXTREMITY Common Femoral Vein: No evidence of thrombus. Normal compressibility, respiratory phasicity and response to augmentation. Saphenofemoral Junction: No evidence of thrombus. Normal compressibility and flow on color Doppler imaging. Profunda Femoral Vein: No evidence of thrombus. Normal compressibility and flow on color Doppler imaging. Femoral Vein: No evidence of thrombus. Normal compressibility, respiratory phasicity and response to augmentation. Popliteal Vein: No  evidence of thrombus. Normal compressibility, respiratory phasicity and response to augmentation. Calf Veins: No evidence of thrombus. Normal compressibility and flow on color Doppler imaging. Superficial Great Saphenous Vein: No evidence of thrombus. Normal compressibility and flow on color Doppler imaging. Other Findings:  None. LEFT LOWER EXTREMITY Common Femoral Vein: No evidence of thrombus. Normal compressibility, respiratory phasicity and response to augmentation. Saphenofemoral Junction: No evidence of thrombus. Normal compressibility and flow on color Doppler imaging. Profunda Femoral Vein: No evidence of thrombus. Normal compressibility and flow on color Doppler imaging. Femoral Vein: No evidence of thrombus. Normal compressibility, respiratory phasicity and response to augmentation. Popliteal Vein: No evidence of thrombus. Normal compressibility, respiratory phasicity and response to augmentation. Calf Veins: No evidence of thrombus. Normal compressibility and flow on color Doppler imaging. Superficial Great Saphenous Vein: No evidence of thrombus. Normal compressibility and flow on color Doppler imaging. Other Findings: Small cystic structure and a left popliteal fossa, likely a Baker's cyst. IMPRESSION: Sonographic survey of the bilateral lower extremities negative for DVT. Likely left Baker cyst Electronically Signed   By: Corrie Mckusick D.O.   On: 08/04/2017 15:56    Catera Hankins, DO  [facility] Pager [phone]  If 7PM-7AM, please contact night-coverage [email or web address] Password [email or web address] 08/05/2017, 4:05 PM   LOS: 2 days

## 2017-08-05 NOTE — Progress Notes (Signed)
[place] for HEPARIN Indication: pulmonary embolus  No Known Allergies  Patient Measurements: Height: 5\' 6"  (167.6 cm) Weight: 259 lb 0.6 oz (117.5 kg) IBW/kg (Calculated) : 59.3 HEPARIN DW (KG): 87.1  Vital Signs: Temp: 99.1 F (37.3 C) (11/25 0519) Temp Source: Oral (11/25 0519) BP: 130/72 (11/25 0519) Pulse Rate: 78 (11/25 0519)  Labs: Recent Labs    08/03/17 1930 08/04/17 0759 08/04/17 1930 08/05/17 0812  HGB 14.6 12.7  --  13.0  HCT 44.1 39.4  --  41.0  PLT 238 214  --  251  APTT  --  27  --   --   HEPARINUNFRC  --   --  0.66 0.49  CREATININE 0.80 0.78  --  0.67   Estimated Creatinine Clearance: 108.5 mL/min (by C-G formula based on SCr of 0.67 mg/dL).  Medical History: Past Medical History:  Diagnosis Date  . Anemia   . Anxiety   . Arthritis   . Asthma   . Autonomic neuropathy   . Constipation   . COPD (chronic obstructive pulmonary disease) ([place])   . CTS (carpal tunnel syndrome)   . Depression   . Diabetes mellitus without complication ([place])   . Endometrial cancer ([place]) 2017  . GERD (gastroesophageal reflux disease)   . Headache(784.0)   . HTN (hypertension)   . Hypercholesterolemia   . Hypothyroidism   . IBS (irritable bowel syndrome)   . Osteoarthritis    osteoarthritis  . Pancreatitis    per patient   . Recurrent boils    buttocks and low back.  . Shortness of breath   . Sleep apnea    CPAP machine  . Uterine cancer ([place]) 2017  . Vitamin D deficiency    Medications:  Medications Prior to Admission  Medication Sig Dispense Refill Last Dose  . albuterol (PROVENTIL HFA;VENTOLIN HFA) 108 (90 Base) MCG/ACT inhaler Inhale 2 puffs into the lungs every 6 (six) hours as needed for wheezing or shortness of breath.   Past Month at Unknown time  . albuterol (PROVENTIL) (2.5 MG/3ML) 0.083% nebulizer solution Take 3 mLs (2.5 mg total) by nebulization every 6 (six) hours as needed for wheezing. 75 mL 12 Past  Month at Unknown time  . atorvastatin (LIPITOR) 40 MG tablet Take 40 mg by mouth daily.   08/03/2017 at Unknown time  . cycloSPORINE (RESTASIS) 0.05 % ophthalmic emulsion Place 1 drop into both eyes daily.   Past Month at Unknown time  . esomeprazole (NEXIUM) 40 MG capsule Take 1 capsule (40 mg total) by mouth 2 (two) times daily before a meal. 60 capsule 11 08/03/2017 at Unknown time  . furosemide (LASIX) 40 MG tablet Take 1 tablet (40 mg total) by mouth daily. 30 tablet 0 08/03/2017 at Unknown time  . gabapentin (NEURONTIN) 300 MG capsule Take 300 mg by mouth 3 (three) times daily.   08/03/2017 at Unknown time  . hydrocortisone 1 % ointment Apply 1 application topically 2 (two) times daily. 30 g prn unknown  . insulin NPH-regular Human (NOVOLIN 70/30) (70-30) 100 UNIT/ML injection Inject 35-45 Units into the skin 3 (three) times daily. 35 units in the morning at breakffast and 35 units in the evening with supper and 45 units at bedtime   08/03/2017 at Unknown time  . lisinopril (PRINIVIL,ZESTRIL) 20 MG tablet Take 1 tablet (20 mg total) by mouth daily. 30 tablet 0 08/03/2017 at Unknown time  . metFORMIN (GLUCOPHAGE) 1000 MG tablet Take 1 tablet (1,000  mg total) by mouth 2 (two) times daily with a meal. 60 tablet 0 08/03/2017 at Unknown time  . montelukast (SINGULAIR) 10 MG tablet Take 10 mg by mouth at bedtime.    08/03/2017 at Unknown time  . naproxen (NAPROSYN) 500 MG tablet Take 1 tablet (500 mg total) by mouth 2 (two) times daily with a meal. 60 tablet 5 unknown  . naproxen (NAPROSYN) 500 MG tablet Take 1 tablet (500 mg total) by mouth 2 (two) times daily with a meal. 60 tablet 5 unknown  . nystatin (MYCOSTATIN) 100000 UNIT/ML suspension Take 5 mLs (500,000 Units total) by mouth 4 (four) times daily. 473 mL 0 unknown  . ondansetron (ZOFRAN ODT) 4 MG disintegrating tablet Take 4 mg by mouth every 8 (eight) hours as needed for nausea or vomiting.    08/03/2017 at Unknown time  . potassium chloride  (K-DUR) 10 MEQ tablet Take 10 mEq by mouth every evening.    08/03/2017 at Unknown time  . rosuvastatin (CRESTOR) 40 MG tablet Take 1 tablet (40 mg total) by mouth daily. 30 tablet 0 08/03/2017 at Unknown time  . sitaGLIPtin (JANUVIA) 100 MG tablet Take 100 mg by mouth daily.   08/03/2017 at Unknown time  . traMADol (ULTRAM) 50 MG tablet Take 50 mg by mouth every 6 (six) hours as needed for moderate pain.   08/03/2017 at Unknown time  . Vitamin D, Ergocalciferol, (DRISDOL) 50000 units CAPS capsule Take 50,000 Units by mouth every 7 (seven) days.   08/03/2017 at Unknown time  . linaclotide (LINZESS) 290 MCG CAPS capsule Take 1 capsule (290 mcg total) by mouth daily before breakfast. 90 capsule 3 Taking   Assessment: 51yo female with PE.  Pt was given Lovenox 1mg /Kg x 1 last evening.  Now asked to start IV Heparin.  Pt is obese.  Heparin level is therapeutic.    Goal of Therapy:  Heparin level 0.3-0.7 units/ml Monitor platelets by anticoagulation protocol: Yes   Plan:   Continue Heparin infusion at 1500 units/hr   Heparin level daily  F/u plans for oral AC  CBC daily while on Heparin  Hart Robinsons, PharmD Clinical Pharmacist Pager:  [phone] 08/05/2017   Hart Robinsons A 08/05/2017,9:45 AM

## 2017-08-06 DIAGNOSIS — Z86711 Personal history of pulmonary embolism: Secondary | ICD-10-CM

## 2017-08-06 DIAGNOSIS — I2699 Other pulmonary embolism without acute cor pulmonale: Secondary | ICD-10-CM

## 2017-08-06 LAB — BASIC METABOLIC PANEL
Anion gap: 7 (ref 5–15)
BUN: 8 mg/dL (ref 6–20)
CALCIUM: 9.6 mg/dL (ref 8.9–10.3)
CO2: 28 mmol/L (ref 22–32)
CREATININE: 0.77 mg/dL (ref 0.44–1.00)
Chloride: 102 mmol/L (ref 101–111)
Glucose, Bld: 272 mg/dL — ABNORMAL HIGH (ref 65–99)
Potassium: 3.7 mmol/L (ref 3.5–5.1)
SODIUM: 137 mmol/L (ref 135–145)

## 2017-08-06 LAB — GLUCOSE, CAPILLARY: GLUCOSE-CAPILLARY: 255 mg/dL — AB (ref 65–99)

## 2017-08-06 LAB — MAGNESIUM: MAGNESIUM: 1.6 mg/dL — AB (ref 1.7–2.4)

## 2017-08-06 LAB — VITAMIN D 25 HYDROXY (VIT D DEFICIENCY, FRACTURES): VIT D 25 HYDROXY: 17.9 ng/mL — AB (ref 30.0–100.0)

## 2017-08-06 MED ORDER — APIXABAN 5 MG PO TABS: 10.0000 mg | ORAL_TABLET | Freq: Two times a day (BID) | ORAL | 0 refills | Status: DC

## 2017-08-06 MED ORDER — MAGNESIUM SULFATE 2 GM/50ML IV SOLN: 2.0000 g | Freq: Once | INTRAVENOUS | Status: DC

## 2017-08-06 MED ORDER — INSULIN ASPART PROT & ASPART (70-30 MIX) 100 UNIT/ML ~~LOC~~ SUSP: 50.0000 [IU] | Freq: Every day | SUBCUTANEOUS | 0 refills | Status: DC

## 2017-08-06 MED ORDER — DOXYCYCLINE HYCLATE 100 MG PO TABS: 100.0000 mg | ORAL_TABLET | Freq: Two times a day (BID) | ORAL | 0 refills | Status: DC

## 2017-08-06 MED ORDER — INSULIN ASPART PROT & ASPART (70-30 MIX) 100 UNIT/ML ~~LOC~~ SUSP
50.0000 [IU] | Freq: Every day | SUBCUTANEOUS | Status: DC
  Filled 2017-08-06: qty 10

## 2017-08-06 MED ORDER — MAGNESIUM OXIDE 400 (241.3 MG) MG PO TABS: 400.0000 mg | ORAL_TABLET | Freq: Once | ORAL | Status: DC

## 2017-08-06 MED ORDER — INSULIN NPH ISOPHANE & REGULAR (70-30) 100 UNIT/ML ~~LOC~~ SUSP: SUBCUTANEOUS | 1 refills | Status: DC

## 2017-08-06 MED ORDER — INSULIN ASPART PROT & ASPART (70-30 MIX) 100 UNIT/ML ~~LOC~~ SUSP: 50.0000 [IU] | Freq: Every day | SUBCUTANEOUS | Status: DC

## 2017-08-06 NOTE — Care Management Note (Signed)
Case Management Note  Patient Details  Name: Sonya James MRN: [id number] Date of Birth: 18-Dec-1965     Expected Discharge Date:  08/06/17               Expected Discharge Plan:  Home/Self Care  In-House Referral:     Discharge planning Services  CM Consult, Medication Assistance  Post Acute Care Choice:  NA Choice offered to:  NA  DME Arranged:    DME Agency:     HH Arranged:    [place] Agency:     Status of Service:  Completed, signed off  If discussed at [employer] of Stay Meetings, dates discussed:    Additional Comments: Patient discharging home today with acute PE on room air.  30 day free coupon given for Eliquis. She has Medicaid and is aware to follow up with PCP for future prescriptions. No further CM needs.   Lafaye Mcelmurry, Chauncey Reading, RN 08/06/2017, 11:01 AM

## 2017-08-06 NOTE — Progress Notes (Signed)
Discharge instructions gone over with patient, verbalized understanding. IV removed, patient tolerated procedure well. 

## 2017-08-06 NOTE — Progress Notes (Signed)
Inpatient Diabetes Program Recommendations  AACE/ADA: New Consensus Statement on Inpatient Glycemic Control (2015)  Target Ranges:  Prepandial:   less than 140 mg/dL      Peak postprandial:   less than 180 mg/dL (1-2 hours)      Critically ill patients:  140 - 180 mg/dL  Results for Sonya James, Sonya James (MRN [id number]) as of 08/06/2017 08:44  Ref. Range 08/05/2017 07:52 08/05/2017 12:04 08/05/2017 16:40 08/05/2017 21:30 08/06/2017 07:56  Glucose-Capillary Latest Ref Range: 65 - 99 mg/dL 262 (H) 311 (H) 288 (H) 305 (H) 255 (H)   Results for Sonya James, Sonya James (MRN [id number]) as of 08/06/2017 08:44  Ref. Range 08/04/2017 09:00  Hemoglobin A1C Latest Ref Range: 4.8 - 5.6 % 14.1 (H)   Review of Glycemic Control  Diabetes history: DM2 Outpatient Diabetes medications: 70/30 35 units with breakfast, 70/30 35 with supper, 70/30 45 units at bedtime, Januvia 100 mg daily, Metformin 1000 mg BID Current orders for Inpatient glycemic control: 70/30 45 units with breakfast, 70/30 40 units with supper, 70/30 45 units at bedtime, Novolog 0-20 units TID with meals, Novolog 0-5 units QHS  Inpatient Diabetes Program Recommendations: Insulin - Basal: Please consider increasing 70/30 to 45 units with breakfast, 70/30 45 units with supper, and 70/30 45 units with supper.  Thanks, Barnie Alderman, RN, MSN, CDE Diabetes Coordinator Inpatient Diabetes Program [phone] (Team Pager from 8am to 5pm)

## 2017-08-07 LAB — LUPUS ANTICOAGULANT PANEL
DRVVT: 38.9 s (ref 0.0–47.0)
PTT LA: 33.4 s (ref 0.0–51.9)

## 2017-08-08 LAB — FACTOR 5 LEIDEN

## 2017-08-08 LAB — PROTHROMBIN GENE MUTATION

## 2017-08-15 ENCOUNTER — Encounter (HOSPITAL_COMMUNITY): Payer: Self-pay

## 2017-08-15 ENCOUNTER — Ambulatory Visit (HOSPITAL_COMMUNITY)
Admission: RE | Admit: 2017-08-15 | Discharge: 2017-08-15 | Disposition: A | Discharge status: Home / Self Care | Payer: Medicaid Other | Source: Ambulatory Visit | Attending: Internal Medicine | Admitting: Internal Medicine

## 2017-08-15 DIAGNOSIS — Z1231 Encounter for screening mammogram for malignant neoplasm of breast: Secondary | ICD-10-CM

## 2017-08-21 ENCOUNTER — Ambulatory Visit: Payer: Medicaid Other | Admitting: Orthopaedic Surgery

## 2017-08-28 ENCOUNTER — Ambulatory Visit: Payer: Medicaid Other | Admitting: Orthopaedic Surgery

## 2017-09-13 ENCOUNTER — Encounter: Payer: Self-pay | Admitting: Orthopaedic Surgery

## 2017-09-13 ENCOUNTER — Ambulatory Visit: Payer: Medicaid Other | Admitting: Orthopaedic Surgery

## 2017-09-13 VITALS — BP 200/152 | HR 68 | Temp 97.8°F | Ht 66.0 in | Wt 280.0 lb

## 2017-09-13 DIAGNOSIS — G8929 Other chronic pain: Secondary | ICD-10-CM

## 2017-09-13 DIAGNOSIS — M25561 Pain in right knee: Secondary | ICD-10-CM | POA: Diagnosis not present

## 2017-09-13 DIAGNOSIS — M25562 Pain in left knee: Secondary | ICD-10-CM

## 2017-09-13 DIAGNOSIS — F17210 Nicotine dependence, cigarettes, uncomplicated: Secondary | ICD-10-CM

## 2017-09-13 NOTE — Progress Notes (Signed)
CC: Both of my knees are hurting. I would like an injection in both knees.  The patient has had chronic pain and tenderness of both knees for some time.  Injections help.  There is no locking or giving way of the knee.  There is no new trauma. There is no redness or signs of infections.  The knees have a mild effusion and some crepitus.  There is no redness or signs of recent trauma.  Right knee ROM is 0-105 and left knee ROM is 0-110.  Impression:  Chronic pain of the both knees  Return:  7 weeks  PROCEDURE NOTE:  The patient requests injections of both knees, verbal consent was obtained.  The left and right knee were individually prepped appropriately after time out was performed.   Sterile technique was observed and injection of 1 cc of Depo-Medrol 40 mg with several cc's of plain xylocaine. Anesthesia was provided by ethyl chloride and a 20-gauge needle was used to inject each knee area. The injections were tolerated well.  A band aid dressing was applied.  The patient was advised to apply ice later today and tomorrow to the injection sight as needed.   Electronically Signed Sanjuana Kava, MD 1/3/201910:12 AM

## 2017-11-01 ENCOUNTER — Ambulatory Visit: Payer: Medicaid Other | Admitting: Orthopaedic Surgery

## 2017-11-06 ENCOUNTER — Encounter: Payer: Self-pay | Admitting: Orthopaedic Surgery

## 2017-11-06 ENCOUNTER — Telehealth: Payer: Self-pay | Admitting: Orthopaedic Surgery

## 2017-11-06 ENCOUNTER — Ambulatory Visit: Payer: Medicaid Other | Admitting: Orthopaedic Surgery

## 2017-11-06 DIAGNOSIS — F17210 Nicotine dependence, cigarettes, uncomplicated: Secondary | ICD-10-CM

## 2017-11-06 DIAGNOSIS — G8929 Other chronic pain: Secondary | ICD-10-CM

## 2017-11-06 DIAGNOSIS — M25561 Pain in right knee: Secondary | ICD-10-CM

## 2017-11-06 DIAGNOSIS — M25562 Pain in left knee: Secondary | ICD-10-CM

## 2017-11-06 MED ORDER — HYDROCODONE-ACETAMINOPHEN 5-325 MG PO TABS: ORAL_TABLET | ORAL | 0 refills | Status: DC

## 2017-11-06 NOTE — Progress Notes (Signed)
CC: Both of my knees are hurting. I would like an injection in both knees.  The patient has had chronic pain and tenderness of both knees for some time.  Injections help.  There is no locking or giving way of the knee.  There is no new trauma. There is no redness or signs of infections.  The knees have a mild effusion and some crepitus.  There is no redness or signs of recent trauma.  Right knee ROM is 0-110 and left knee ROM is 0-105.  Impression:  Chronic pain of the both knees  Return:  3 months  PROCEDURE NOTE:  The patient requests injections of both knees, verbal consent was obtained.  The left and right knee were individually prepped appropriately after time out was performed.   Sterile technique was observed and injection of 1 cc of Depo-Medrol 40 mg with several cc's of plain xylocaine. Anesthesia was provided by ethyl chloride and a 20-gauge needle was used to inject each knee area. The injections were tolerated well.  A band aid dressing was applied.  The patient was advised to apply ice later today and tomorrow to the injection sight as needed.   I have reviewed the [place] web site prior to prescribing narcotic medicine for this patient.  Electronically Signed Sanjuana Kava, MD 2/26/20191:44 PM

## 2017-11-06 NOTE — Patient Instructions (Signed)
Steps to Quit Smoking Smoking tobacco can be bad for your health. It can also affect almost every organ in your body. Smoking puts you and people around you at risk for many serious long-lasting (chronic) diseases. Quitting smoking is hard, but it is one of the best things that you can do for your health. It is never too late to quit. What are the benefits of quitting smoking? When you quit smoking, you lower your risk for getting serious diseases and conditions. They can include:  Lung cancer or lung disease.  Heart disease.  Stroke.  Heart attack.  Not being able to have children (infertility).  Weak bones (osteoporosis) and broken bones (fractures).  If you have coughing, wheezing, and shortness of breath, those symptoms may get better when you quit. You may also get sick less often. If you are pregnant, quitting smoking can help to lower your chances of having a baby of low birth weight. What can I do to help me quit smoking? Talk with your doctor about what can help you quit smoking. Some things you can do (strategies) include:  Quitting smoking totally, instead of slowly cutting back how much you smoke over a period of time.  Going to in-person counseling. You are more likely to quit if you go to many counseling sessions.  Using resources and support systems, such as: ? Online chats with a counselor. ? Phone quitlines. ? Printed self-help materials. ? Support groups or group counseling. ? Text messaging programs. ? Mobile phone apps or applications.  Taking medicines. Some of these medicines may have nicotine in them. If you are pregnant or breastfeeding, do not take any medicines to quit smoking unless your doctor says it is okay. Talk with your doctor about counseling or other things that can help you.  Talk with your doctor about using more than one strategy at the same time, such as taking medicines while you are also going to in-person counseling. This can help make  quitting easier. What things can I do to make it easier to quit? Quitting smoking might feel very hard at first, but there is a lot that you can do to make it easier. Take these steps:  Talk to your family and friends. Ask them to support and encourage you.  Call phone quitlines, reach out to support groups, or work with a counselor.  Ask people who smoke to not smoke around you.  Avoid places that make you want (trigger) to smoke, such as: ? Bars. ? Parties. ? Smoke-break areas at work.  Spend time with people who do not smoke.  Lower the stress in your life. Stress can make you want to smoke. Try these things to help your stress: ? Getting regular exercise. ? Deep-breathing exercises. ? Yoga. ? Meditating. ? Doing a body scan. To do this, close your eyes, focus on one area of your body at a time from head to toe, and notice which parts of your body are tense. Try to relax the muscles in those areas.  Download or buy apps on your mobile phone or tablet that can help you stick to your quit plan. There are many free apps, such as QuitGuide from the CDC (Centers for Disease Control and Prevention). You can find more support from smokefree.gov and other websites.  This information is not intended to replace advice given to you by your health care provider. Make sure you discuss any questions you have with your health care provider. Document Released: 06/24/2009 Document   Revised: 04/25/2016 Document Reviewed: 01/12/2015 Elsevier Interactive Patient Education  2018 Elsevier Inc.  

## 2017-11-06 NOTE — Telephone Encounter (Signed)
I am not aware of any problems with NPI for Dr Luna Glasgow, have sent message to Randall Hiss to check on this for [place], then I will call [facility] back.

## 2017-11-06 NOTE — Telephone Encounter (Signed)
Kenney Houseman from [place] [facility], [place], called with question, NPI-related, regarding prescription ordered today, 11/06/17  HYDROcodone-acetaminophen (NORCO/VICODIN) 5-325 MG tablet 30 tablet

## 2017-11-07 NOTE — Telephone Encounter (Signed)
What is this account and notices that she need a prescription?

## 2017-11-07 NOTE — Telephone Encounter (Signed)
To [employer]

## 2017-11-07 NOTE — Telephone Encounter (Signed)
Patient also called to request advice regarding what is holding picking up her prescription.

## 2017-11-08 ENCOUNTER — Other Ambulatory Visit: Payer: Self-pay | Admitting: *Deleted

## 2017-11-08 ENCOUNTER — Ambulatory Visit: Payer: Medicaid Other | Admitting: Gastroenterology

## 2017-11-08 ENCOUNTER — Encounter: Payer: Self-pay | Admitting: Gastroenterology

## 2017-11-08 ENCOUNTER — Other Ambulatory Visit: Payer: Self-pay | Admitting: Orthopedic Surgery

## 2017-11-08 VITALS — BP 125/78 | HR 69 | Temp 96.9°F | Ht 65.0 in | Wt 286.8 lb

## 2017-11-08 DIAGNOSIS — I2699 Other pulmonary embolism without acute cor pulmonale: Secondary | ICD-10-CM

## 2017-11-08 DIAGNOSIS — K219 Gastro-esophageal reflux disease without esophagitis: Secondary | ICD-10-CM

## 2017-11-08 DIAGNOSIS — K5900 Constipation, unspecified: Secondary | ICD-10-CM | POA: Diagnosis not present

## 2017-11-08 MED ORDER — HYDROCODONE-ACETAMINOPHEN 5-325 MG PO TABS: ORAL_TABLET | ORAL | 0 refills | Status: DC

## 2017-11-08 NOTE — Telephone Encounter (Signed)
Pended, reorder of what Dr Luna Glasgow just sent that can not be filled. Was in the original message from [place]. At the bottom of message.

## 2017-11-08 NOTE — Telephone Encounter (Signed)
Going forward to improve rx ordering :  Lets put name of med  dob  In the  note

## 2017-11-08 NOTE — Patient Instructions (Addendum)
Continue Linzess once each morning, 30 minutes before breakfast.   Continue Nexium twice a day, 30 minutes before breakfast.   I am referring you to Hematology due to history of blood clots.   I recommend calling your neurologist due to the numbness/tingling in your lower extremities. You may need a different specialist, but this is a start.   We will see you in 6 months!  It was a pleasure to see you today. I strive to create trusting relationships with patients to provide genuine, compassionate, and quality care. I value your feedback. If you receive a survey regarding your visit,  I greatly appreciate you taking time to fill this out.   Annitta Needs, PhD, ANP-BC [facility]

## 2017-11-08 NOTE — Telephone Encounter (Signed)
Can you look at this with me? All the information is in there, I am not sure why I am seeing it but you are not.

## 2017-11-08 NOTE — Progress Notes (Signed)
Sonya James reporting system for opioids has been reviewed

## 2017-11-08 NOTE — Progress Notes (Signed)
Primary Care Physician:  Rogers Blocker, MD Primary GI: Dr. Oneida Alar   Chief Complaint  Patient presents with  . Constipation    f/u, Linzess helping  . Gastroesophageal Reflux    doing ok    HPI:   Sonya James is a 52 y.o. female presenting today with a history of  constipation, dysphagia, GERD. 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 Colonoscopy with hyperplastic polyps.  Linzess 290 mcg once daily. Drinks coffee behind it and makes it go. Nexium BID. In interim from last visit, was inpatient with PEs. Now on Eliquis.   Notes numbness/tingling in lower extremities that radiate up to her groin. She has a neurologist and advised to follow-up with them. She is worried about why she has blood clots.   Past Medical History:  Diagnosis Date  . Anemia   . Anxiety   . Arthritis   . Asthma   . Autonomic neuropathy   . Constipation   . COPD (chronic obstructive pulmonary disease) ([place])   . CTS (carpal tunnel syndrome)   . Depression   . Diabetes mellitus without complication ([place])   . Endometrial cancer ([place]) 2017  . GERD (gastroesophageal reflux disease)   . Headache(784.0)   . HTN (hypertension)   . Hypercholesterolemia   . Hypothyroidism   . IBS (irritable bowel syndrome)   . Osteoarthritis    osteoarthritis  . Pancreatitis    per patient   . Recurrent boils    buttocks and low back.  . Shortness of breath   . Sleep apnea    CPAP machine  . Uterine cancer ([place]) 2017  . Vitamin D deficiency     Past Surgical History:  Procedure Laterality Date  . ABDOMINAL HYSTERECTOMY     endometrial cancer, surgery done at [facility]   . BIOPSY  01/02/2017   Procedure: BIOPSY;  Surgeon: Danie Binder, MD;  Location[facility];  Service: Endoscopy;;  gastric biopsy  . BREAST REDUCTION SURGERY    . CESAREAN SECTION    . COLONOSCOPY WITH PROPOFOL N/A 01/02/2017   Dr. Oneida Alar: redundant colon, two 2-3  mm polyps in sigmoid colon (hyperplastic)  . ESOPHAGOGASTRODUODENOSCOPY  05/22/2011   Dr. Oneida Alar: H.pylori gastritis   . ESOPHAGOGASTRODUODENOSCOPY (EGD) WITH PROPOFOL N/A 01/02/2017   Dr. 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 FOOT SURGERY Left    tendon repair  . OOPHORECTOMY    . POLYPECTOMY  01/02/2017   Procedure: POLYPECTOMY;  Surgeon: Danie Binder, MD;  Location: [facility];  Service: Endoscopy;;  sigmoid colon polyps times 2  . REDUCTION MAMMAPLASTY    . SAVORY DILATION N/A 01/02/2017   Procedure: SAVORY DILATION;  Surgeon: Danie Binder, MD;  Location: [facility];  Service: Endoscopy;  Laterality: N/A;  . TUBAL LIGATION      Current Outpatient Medications  Medication Sig Dispense Refill  . albuterol (PROVENTIL HFA;VENTOLIN HFA) 108 (90 Base) MCG/ACT inhaler Inhale 2 puffs into the lungs every 6 (six) hours as needed for wheezing or shortness of breath.    [name] albuterol (PROVENTIL) (2.5 MG/3ML) 0.083% nebulizer solution Take 3 mLs (2.5 mg total) by nebulization every 6 (six) hours as needed for wheezing. 75 mL 12  . apixaban (ELIQUIS) 5 MG TABS tablet Take 2 tablets (10 mg total) by mouth 2 (two) times daily. On 08/12/17 @ 9PM, start 1 tab (5  mg) two times daily. 72 tablet 0  . atorvastatin (LIPITOR) 40 MG tablet Take 40 mg by mouth daily.    . cycloSPORINE (RESTASIS) 0.05 % ophthalmic emulsion Place 1 drop into both eyes daily.    [name] esomeprazole (NEXIUM) 40 MG capsule Take 1 capsule (40 mg total) by mouth 2 (two) times daily before a meal. 60 capsule 11  . furosemide (LASIX) 40 MG tablet Take 1 tablet (40 mg total) by mouth daily. 30 tablet 0  . gabapentin (NEURONTIN) 300 MG capsule Take 300 mg by mouth 3 (three) times daily.    . hydrocortisone 1 % ointment Apply 1 application topically 2 (two) times daily. 30 g prn  . insulin NPH-regular Human (NOVOLIN 70/30 RELION) (70-30) 100 UNIT/ML injection Inject 50 units into  skin three times daily (breakfast, supper, bedtime) 10 mL 1  . linaclotide (LINZESS) 290 MCG CAPS capsule Take 1 capsule (290 mcg total) by mouth daily before breakfast. 90 capsule 3  . lisinopril (PRINIVIL,ZESTRIL) 20 MG tablet Take 1 tablet (20 mg total) by mouth daily. 30 tablet 0  . metFORMIN (GLUCOPHAGE) 1000 MG tablet Take 1 tablet (1,000 mg total) by mouth 2 (two) times daily with a meal. 60 tablet 0  . montelukast (SINGULAIR) 10 MG tablet Take 10 mg by mouth at bedtime.     [name] nystatin (MYCOSTATIN) 100000 UNIT/ML suspension Take 5 mLs (500,000 Units total) by mouth 4 (four) times daily. 473 mL 0  . ondansetron (ZOFRAN ODT) 4 MG disintegrating tablet Take 4 mg by mouth every 8 (eight) hours as needed for nausea or vomiting.     . potassium chloride (K-DUR) 10 MEQ tablet Take 10 mEq by mouth every evening.     . sitaGLIPtin (JANUVIA) 100 MG tablet Take 100 mg by mouth daily.    . Vitamin D, Ergocalciferol, (DRISDOL) 50000 units CAPS capsule Take 50,000 Units by mouth every 7 (seven) days.    [name] HYDROcodone-acetaminophen (NORCO/VICODIN) 5-325 MG tablet One tablet every four hours as needed for acute pain.  Limit of five days per [place]. (Patient not taking: Reported on 11/08/2017) 30 tablet 0  . naproxen (NAPROSYN) 500 MG tablet Take 1 tablet (500 mg total) by mouth 2 (two) times daily with a meal. (Patient not taking: Reported on 11/08/2017) 60 tablet 5  . rosuvastatin (CRESTOR) 40 MG tablet Take 1 tablet (40 mg total) by mouth daily. (Patient not taking: Reported on 11/08/2017) 30 tablet 0   No current facility-administered medications for this visit.     Allergies as of 11/08/2017  . (No Known Allergies)    Family History  Problem Relation Age of Onset  . Diabetes Mother   . Hypertension Mother   . COPD Mother   . Asthma Mother   . Hypertension Sister   . Hyperlipidemia Sister   . Diabetes Sister   . Asthma Brother   . Alcohol abuse Brother   . Asthma Son   . Cancer  Maternal Grandmother        lung, throat  . Heart disease Maternal Grandfather   . Hypertension Sister   . Hyperlipidemia Sister   . Colon cancer Neg Hx     Social History   Socioeconomic History  . Marital status: Married    Spouse name: None  . Number of children: 1  . Years of education: None  . Highest education level: None  Social Needs  . Financial resource strain: None  . Food insecurity - worry: None  .  Food insecurity - inability: None  . Transportation needs - medical: None  . Transportation needs - non-medical: None  Occupational History  . None  Tobacco Use  . Smoking status: Current Some Day Smoker    Packs/day: 0.25    Years: 22.00    Pack years: 5.50    Types: Cigarettes  . Smokeless tobacco: Never Used  . Tobacco comment: 2-3 a day  Substance and Sexual Activity  . Alcohol use: No  . Drug use: No  . Sexual activity: Yes    Birth control/protection: Surgical    Comment: hyst  Other Topics Concern  . None  Social History Narrative   Drinks coffee occasionally, soda 2-3 daily     Review of Systems: Gen: Denies fever, chills, anorexia. Denies fatigue, weakness, weight loss.  CV: Denies chest pain, palpitations, syncope, peripheral edema, and claudication. Resp: Denies dyspnea at rest, cough, wheezing, coughing up blood, and pleurisy. GI: see HPI  Derm: Denies rash, itching, dry skin Psych: Denies depression, anxiety, memory loss, confusion. No homicidal or suicidal ideation.  Heme: Denies bruising, bleeding, and enlarged lymph nodes.  Physical Exam: BP 125/78   Pulse 69   Temp (!) 96.9 F (36.1 C) (Oral)   Ht 5\' 5"  (1.651 m)   Wt 286 lb 12.8 oz (130.1 kg)   LMP 09/20/2015 (Exact Date)   BMI 47.73 kg/m  General:   Alert and oriented. No distress noted. Pleasant and cooperative.  Head:  Normocephalic and atraumatic. Eyes:  Conjuctiva clear without scleral icterus. Mouth:  Oral mucosa pink and moist.  Abdomen:  +BS, soft, non-tender and  non-distended. No rebound or guarding. No HSM or masses noted. Msk:  Symmetrical without gross deformities. Normal posture. Extremities:  Without edema. Neurologic:  Alert and  oriented x4 Psych:  Alert and cooperative. Normal mood and affect.  Lab Results  Component Value Date   WBC 9.2 08/05/2017   HGB 13.0 08/05/2017   HCT 41.0 08/05/2017   MCV 94.7 08/05/2017   PLT 251 08/05/2017   Lab Results  Component Value Date   ALT 13 (L) 08/04/2017   AST 17 08/04/2017   ALKPHOS 116 08/04/2017   BILITOT 0.4 08/04/2017   Lab Results  Component Value Date   CREATININE 0.77 08/06/2017   BUN 8 08/06/2017   NA 137 08/06/2017   K 3.7 08/06/2017   CL 102 08/06/2017   CO2 28 08/06/2017

## 2017-11-13 NOTE — Assessment & Plan Note (Signed)
Nov 2018. On Eliquis. I am not sure if she has been evaluated for a clotting disorder. She desires further evaluation. Refer to Hematology.

## 2017-11-13 NOTE — Progress Notes (Signed)
CC'ED TO PCP 

## 2017-11-13 NOTE — Assessment & Plan Note (Signed)
Continue Linzess 290 mcg once daily. Doing well. Return in 6 months.

## 2017-11-13 NOTE — Assessment & Plan Note (Signed)
Continue Nexium BID.

## 2017-11-26 ENCOUNTER — Ambulatory Visit (HOSPITAL_COMMUNITY): Payer: Medicaid Other | Admitting: Hematology

## 2017-11-28 ENCOUNTER — Inpatient Hospital Stay (HOSPITAL_COMMUNITY): Payer: Medicaid Other

## 2017-11-28 ENCOUNTER — Inpatient Hospital Stay (HOSPITAL_COMMUNITY): Payer: Medicaid Other | Attending: Hematology | Admitting: Hematology

## 2017-11-28 ENCOUNTER — Encounter (HOSPITAL_COMMUNITY): Payer: Self-pay | Admitting: Hematology

## 2017-11-28 ENCOUNTER — Other Ambulatory Visit: Payer: Self-pay

## 2017-11-28 VITALS — BP 154/98 | HR 73 | Temp 98.0°F | Resp 20 | Wt 291.3 lb

## 2017-11-28 DIAGNOSIS — E7800 Pure hypercholesterolemia, unspecified: Secondary | ICD-10-CM | POA: Insufficient documentation

## 2017-11-28 DIAGNOSIS — Z794 Long term (current) use of insulin: Secondary | ICD-10-CM | POA: Diagnosis not present

## 2017-11-28 DIAGNOSIS — I2699 Other pulmonary embolism without acute cor pulmonale: Secondary | ICD-10-CM | POA: Insufficient documentation

## 2017-11-28 DIAGNOSIS — F418 Other specified anxiety disorders: Secondary | ICD-10-CM | POA: Diagnosis not present

## 2017-11-28 DIAGNOSIS — Z90710 Acquired absence of both cervix and uterus: Secondary | ICD-10-CM | POA: Diagnosis not present

## 2017-11-28 DIAGNOSIS — I10 Essential (primary) hypertension: Secondary | ICD-10-CM | POA: Insufficient documentation

## 2017-11-28 DIAGNOSIS — Z90722 Acquired absence of ovaries, bilateral: Secondary | ICD-10-CM | POA: Insufficient documentation

## 2017-11-28 DIAGNOSIS — Z8542 Personal history of malignant neoplasm of other parts of uterus: Secondary | ICD-10-CM | POA: Insufficient documentation

## 2017-11-28 DIAGNOSIS — K219 Gastro-esophageal reflux disease without esophagitis: Secondary | ICD-10-CM

## 2017-11-28 DIAGNOSIS — J449 Chronic obstructive pulmonary disease, unspecified: Secondary | ICD-10-CM | POA: Insufficient documentation

## 2017-11-28 DIAGNOSIS — Z79899 Other long term (current) drug therapy: Secondary | ICD-10-CM | POA: Diagnosis not present

## 2017-11-28 DIAGNOSIS — E119 Type 2 diabetes mellitus without complications: Secondary | ICD-10-CM | POA: Insufficient documentation

## 2017-11-28 DIAGNOSIS — M129 Arthropathy, unspecified: Secondary | ICD-10-CM | POA: Diagnosis not present

## 2017-11-28 DIAGNOSIS — M1990 Unspecified osteoarthritis, unspecified site: Secondary | ICD-10-CM | POA: Diagnosis not present

## 2017-11-28 DIAGNOSIS — F17210 Nicotine dependence, cigarettes, uncomplicated: Secondary | ICD-10-CM | POA: Insufficient documentation

## 2017-11-28 DIAGNOSIS — K5900 Constipation, unspecified: Secondary | ICD-10-CM | POA: Diagnosis not present

## 2017-11-28 DIAGNOSIS — E559 Vitamin D deficiency, unspecified: Secondary | ICD-10-CM | POA: Diagnosis not present

## 2017-11-28 DIAGNOSIS — E039 Hypothyroidism, unspecified: Secondary | ICD-10-CM

## 2017-11-28 DIAGNOSIS — G4730 Sleep apnea, unspecified: Secondary | ICD-10-CM | POA: Diagnosis not present

## 2017-11-28 LAB — D-DIMER, QUANTITATIVE: D-Dimer, Quant: 0.31 ug/mL-FEU (ref 0.00–0.50)

## 2017-11-28 LAB — ANTITHROMBIN III: ANTITHROMB III FUNC: 103 % (ref 75–120)

## 2017-11-28 NOTE — Progress Notes (Signed)
CONSULT NOTE  Patient Care Team: Rogers Blocker, MD as PCP - General (Internal Medicine) Danie Binder, MD (Gastroenterology)  CHIEF COMPLAINTS/PURPOSE OF CONSULTATION:  Pulmonary embolism  HISTORY OF PRESENTING ILLNESS:  Sonya James 52 y.o. female is seen today for further workup of bilateral pulmonary embolism.  She went to the emergency room on 08/03/2017 with shortness of breath and was found to have bilateral pulmonary embolism on CT scan.  She denies any immobilization or hospitalization prior to that episode.  She never had blood clots in the past.  She never had any miscarriages.  She denies any connective tissue disorders.  She denies any fevers, night sweats or weight loss.  She was started on Eliquis and is currently taking 5 mg twice a day.  She denies any bleeds from Eliquis.  She had endometrial cancer in 2000 16/2017 for which she underwent TAH and BSO.  She denies any hormone replacement therapy.  MEDICAL HISTORY:  Past Medical History:  Diagnosis Date  . Anemia   . Anxiety   . Arthritis   . Asthma   . Autonomic neuropathy   . Constipation   . COPD (chronic obstructive pulmonary disease) ([place])   . CTS (carpal tunnel syndrome)   . Depression   . Diabetes mellitus without complication ([place])   . Endometrial cancer ([place]) 2017  . GERD (gastroesophageal reflux disease)   . Headache(784.0)   . HTN (hypertension)   . Hypercholesterolemia   . Hypothyroidism   . IBS (irritable bowel syndrome)   . Osteoarthritis    osteoarthritis  . Pancreatitis    per patient   . Recurrent boils    buttocks and low back.  . Shortness of breath   . Sleep apnea    CPAP machine  . Uterine cancer ([place]) 2017  . Vitamin D deficiency     SURGICAL HISTORY: Past Surgical History:  Procedure Laterality Date  . ABDOMINAL HYSTERECTOMY  2018   endometrial cancer, surgery done at [facility]   . BIOPSY  01/02/2017   Procedure: BIOPSY;  Surgeon: Danie Binder, MD;  Location[facility];  Service: Endoscopy;;  gastric biopsy  . CESAREAN SECTION  1989  . COLONOSCOPY WITH PROPOFOL N/A 01/02/2017   Dr. Oneida Alar: redundant colon, two 2-3 mm polyps in sigmoid colon (hyperplastic)  . ESOPHAGOGASTRODUODENOSCOPY  05/22/2011   Dr. Oneida Alar: H.pylori gastritis   . ESOPHAGOGASTRODUODENOSCOPY (EGD) WITH PROPOFOL N/A 01/02/2017   Dr. 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 FOOT SURGERY Left    tendon repair  . OOPHORECTOMY    . POLYPECTOMY  01/02/2017   Procedure: POLYPECTOMY;  Surgeon: Danie Binder, MD;  Location[facility];  Service: Endoscopy;;  sigmoid colon polyps times 2  . REDUCTION MAMMAPLASTY  1996  . SAVORY DILATION N/A 01/02/2017   Procedure: SAVORY DILATION;  Surgeon: Danie Binder, MD;  Location[facility];  Service: Endoscopy;  Laterality: N/A;  . TUBAL LIGATION      SOCIAL HISTORY: Social History   Socioeconomic History  . Marital status: Married    Spouse name: Not on file  . Number of children: 1  . Years of education: Not on file  . Highest education level: Not on file  Social Needs  . Financial resource strain: Not on file  . Food insecurity - worry: Not on file  . Food insecurity - inability: Not on file  . Transportation needs - medical: Not  on file  . Transportation needs - non-medical: Not on file  Occupational History  . Not on file  Tobacco Use  . Smoking status: Current Some Day Smoker    Packs/day: 0.50    Years: 22.00    Pack years: 11.00    Types: Cigarettes  . Smokeless tobacco: Never Used  Substance and Sexual Activity  . Alcohol use: No  . Drug use: No  . Sexual activity: Yes    Birth control/protection: Surgical    Comment: hyst  Other Topics Concern  . Not on file  Social History Narrative   Drinks coffee occasionally, soda 2-3 daily     FAMILY HISTORY: Family History  Problem Relation Age of Onset  . Diabetes Mother   . Hypertension Mother   . COPD  Mother   . Asthma Mother   . Hypercholesterolemia Mother   . Hypertension Sister   . Hyperlipidemia Sister   . Diabetes Sister   . Asthma Brother   . Alcohol abuse Brother   . Asthma Son   . Cancer Maternal Grandmother        lung, throat, breast  . Alzheimer's disease Maternal Grandmother   . Hypertension Maternal Grandmother   . Hypercholesterolemia Maternal Grandmother   . Heart disease Maternal Grandfather   . Stroke Maternal Grandfather   . Clotting disorder Maternal Grandfather        blood clots in legs  . Hypertension Sister   . Hyperlipidemia Sister   . Stroke Maternal Aunt   . Clotting disorder Maternal Aunt        blood clots in legs  . Hypertension Maternal Aunt   . Hypercholesterolemia Maternal Aunt   . Hypertension Maternal Aunt   . Asthma Maternal Aunt   . Clotting disorder Maternal Uncle        blood clot -legs traveled to heart and he died of heart attack  . Colon cancer Neg Hx     ALLERGIES:  has No Known Allergies.  MEDICATIONS:  Current Outpatient Medications  Medication Sig Dispense Refill  . albuterol (PROVENTIL HFA;VENTOLIN HFA) 108 (90 Base) MCG/ACT inhaler Inhale 2 puffs into the lungs every 6 (six) hours as needed for wheezing or shortness of breath.    [name] albuterol (PROVENTIL) (2.5 MG/3ML) 0.083% nebulizer solution Take 3 mLs (2.5 mg total) by nebulization every 6 (six) hours as needed for wheezing. 75 mL 12  . apixaban (ELIQUIS) 5 MG TABS tablet Take 2 tablets (10 mg total) by mouth 2 (two) times daily. On 08/12/17 @ 9PM, start 1 tab (5 mg) two times daily. (Patient taking differently: Take 5 mg by mouth 2 (two) times daily. On 08/12/17 @ 9PM, start 1 tab (5 mg) two times daily.) 72 tablet 0  . atorvastatin (LIPITOR) 40 MG tablet Take 40 mg by mouth daily.    . cycloSPORINE (RESTASIS) 0.05 % ophthalmic emulsion Place 1 drop into both eyes daily.    [name] esomeprazole (NEXIUM) 40 MG capsule Take 1 capsule (40 mg total) by mouth 2 (two) times daily  before a meal. 60 capsule 11  . furosemide (LASIX) 40 MG tablet Take 1 tablet (40 mg total) by mouth daily. 30 tablet 0  . gabapentin (NEURONTIN) 300 MG capsule Take 300 mg by mouth 3 (three) times daily.    . hydrocortisone 1 % ointment Apply 1 application topically 2 (two) times daily. 30 g prn  . insulin NPH-regular Human (NOVOLIN 70/30 RELION) (70-30) 100 UNIT/ML injection Inject 50 units into skin  three times daily (breakfast, supper, bedtime) 10 mL 1  . linaclotide (LINZESS) 290 MCG CAPS capsule Take 1 capsule (290 mcg total) by mouth daily before breakfast. 90 capsule 3  . lisinopril (PRINIVIL,ZESTRIL) 20 MG tablet Take 1 tablet (20 mg total) by mouth daily. 30 tablet 0  . metFORMIN (GLUCOPHAGE) 1000 MG tablet Take 1 tablet (1,000 mg total) by mouth 2 (two) times daily with a meal. 60 tablet 0  . montelukast (SINGULAIR) 10 MG tablet Take 10 mg by mouth at bedtime.     [name] nystatin (MYCOSTATIN) 100000 UNIT/ML suspension Take 5 mLs (500,000 Units total) by mouth 4 (four) times daily. 473 mL 0  . ondansetron (ZOFRAN ODT) 4 MG disintegrating tablet Take 4 mg by mouth every 8 (eight) hours as needed for nausea or vomiting.     . potassium chloride (K-DUR) 10 MEQ tablet Take 10 mEq by mouth every evening.     . sitaGLIPtin (JANUVIA) 100 MG tablet Take 100 mg by mouth daily.    . Vitamin D, Ergocalciferol, (DRISDOL) 50000 units CAPS capsule Take 50,000 Units by mouth every 7 (seven) days.     No current facility-administered medications for this visit.     REVIEW OF SYSTEMS:   Constitutional: Denies fevers, chills or abnormal night sweats Eyes: Denies blurriness of vision, double vision or watery eyes Ears, nose, mouth, throat, and face: Denies mucositis or sore throat Respiratory: She has shortness of breath on exertion. Cardiovascular: Denies palpitation, chest discomfort or lower extremity swelling Gastrointestinal: She has chronic IBS symptoms and diarrhea alternating with constipation.   Skin: Denies abnormal skin rashes Lymphatics: Denies new lymphadenopathy or easy bruising Neurological:Denies numbness, tingling or new weaknesses Behavioral/Psych: Mood is stable, no new changes  All other systems were reviewed with the patient and are negative.  PHYSICAL EXAMINATION: ECOG PERFORMANCE STATUS: 1 - Symptomatic but completely ambulatory  Vitals:   11/28/17 1214  BP: (!) 154/98  Pulse: 73  Resp: 20  Temp: 98 F (36.7 C)  SpO2: 100%   Filed Weights   11/28/17 1214  Weight: 291 lb 4.8 oz (132.1 kg)    GENERAL:alert, no distress and comfortable SKIN: skin color, texture, turgor are normal, no rashes or significant lesions EYES: normal, conjunctiva are pink and non-injected, sclera clear OROPHARYNX:no exudate, no erythema and lips, buccal mucosa, and tongue normal  NECK: supple, thyroid normal size, non-tender, without nodularity LYMPH:  no palpable lymphadenopathy in the cervical, axillary or inguinal LUNGS: clear to auscultation and percussion with normal breathing effort HEART: regular rate & rhythm and no murmurs and no lower extremity edema ABDOMEN:abdomen soft, non-tender and normal bowel sounds Musculoskeletal:no cyanosis of digits and no clubbing  PSYCH: alert & oriented x 3 with fluent speech NEURO: no focal motor/sensory deficits  LABORATORY DATA:  I have reviewed the data as listed Recent Results (from the past 2160 hour(s))  D-dimer, quantitative     Status: None   Collection Time: 11/28/17  1:06 PM  Result Value Ref Range   D-Dimer, Quant 0.31 0.00 - 0.50 ug/mL-FEU    Comment: (NOTE) At the manufacturer cut-off of 0.50 ug/mL FEU, this assay has been documented to exclude PE with a sensitivity and negative predictive value of 97 to 99%.  At this time, this assay has not been approved by the FDA to exclude DVT/VTE. Results should be correlated with clinical presentation. Performed at [facility], [address], [place], [place] [address]      RADIOGRAPHIC STUDIES: I have independently reviewed  the CT scan of the chest PE protocol from November 2018.  ASSESSMENT & PLAN:  Unprovoked pulmonary embolism: She did not have any instigating factors prior to pulmonary embolism like prolonged travel or immobilization.  She did not have any miscarriages or other connective tissue disorders.  I have recommended doing hypercoagulable workup with lupus anticoagulant, anti-beta-2 glycoprotein 1 antibody, anticardiolipin antibody, prothrombin gene mutation, factor V Leiden among others.  As per history she has localized uterine cancer in 2016, I do not have the records to review at this time.  We will obtain those records.  We will also consider doing further scans if there is no clear cause, to rule out any metastatic disease.  She will come back in 2 weeks to review the results.        Derek Jack, MD 11/28/17 5:33 PM

## 2017-11-28 NOTE — Patient Instructions (Signed)
[place] at [facility] Discharge Instructions  You were seen today by Dr. Delton Coombes    Thank you for choosing [place] at [facility] to provide your oncology and hematology care.  To afford each patient quality time with our provider, please arrive at least 15 minutes before your scheduled appointment time.   If you have a lab appointment with the [facility] please come in thru the  Main Entrance and check in at the main information desk  You need to re-schedule your appointment should you arrive 10 or more minutes late.  We strive to give you quality time with our providers, and arriving late affects you and other patients whose appointments are after yours.  Also, if you no show three or more times for appointments you may be dismissed from the clinic at the providers discretion.     Again, thank you for choosing [facility].  Our hope is that these requests will decrease the amount of time that you wait before being seen by our physicians.       _____________________________________________________________  Should you have questions after your visit to [facility], please contact our office at [phone] between the hours of 8:30 a.m. and 4:30 p.m.  Voicemails left after 4:30 p.m. will not be returned until the following business day.  For prescription refill requests, have your pharmacy contact our office.       Resources For Cancer Patients and their Caregivers ? American Cancer Society: Can assist with transportation, wigs, general needs, runs Look Good Feel Better.        435-063-1252 ? Cancer Care: Provides financial assistance, online support groups, medication/co-pay assistance.  1-800-813-HOPE 647-180-4095) ? [place] Assists [place] cancer patients and their families through emotional , educational and financial support.  (540)841-7076 ? [place] DSS Where to  apply for food stamps, Medicaid and utility assistance. 315-687-6640 ? [facility]: Transportation to medical appointments. 402-215-0891 ? Social Security Administration: May apply for disability if have a Stage IV cancer. 5315059783 6037140072 ? [employer], Disability and Transit Services: Assists with nutrition, care and transit needs. [place] Support Programs:   > Cancer Support Group  2nd Tuesday of the month 1pm-2pm, Journey Room   > Creative Journey  3rd Tuesday of the month 1130am-1pm, Journey Room     Steps to Quit Smoking  Smoking tobacco can be bad for your health. It can also affect almost every organ in your body. Smoking puts you and people around you at risk for many serious long-lasting (chronic) diseases. Quitting smoking is hard, but it is one of the best things that you can do for your health. It is never too late to quit. What are the benefits of quitting smoking? When you quit smoking, you lower your risk for getting serious diseases and conditions. They can include:  Lung cancer or lung disease.  Heart disease.  Stroke.  Heart attack.  Not being able to have children (infertility).  Weak bones (osteoporosis) and broken bones (fractures).  If you have coughing, wheezing, and shortness of breath, those symptoms may get better when you quit. You may also get sick less often. If you are pregnant, quitting smoking can help to lower your chances of having a baby of low birth weight. What can I do to help me quit smoking? Talk with your doctor about what can help you quit smoking. Some things  you can do (strategies) include:  Quitting smoking totally, instead of slowly cutting back how much you smoke over a period of time.  Going to in-person counseling. You are more likely to quit if you go to many counseling sessions.  Using resources and support systems, such as: ? Database administrator with a Social worker. ? Phone quitlines. ? Event organiser. ? Support groups or group counseling. ? Text messaging programs. ? Mobile phone apps or applications.  Taking medicines. Some of these medicines may have nicotine in them. If you are pregnant or breastfeeding, do not take any medicines to quit smoking unless your doctor says it is okay. Talk with your doctor about counseling or other things that can help you.  Talk with your doctor about using more than one strategy at the same time, such as taking medicines while you are also going to in-person counseling. This can help make quitting easier. What things can I do to make it easier to quit? Quitting smoking might feel very hard at first, but there is a lot that you can do to make it easier. Take these steps:  Talk to your family and friends. Ask them to support and encourage you.  Call phone quitlines, reach out to support groups, or work with a Social worker.  Ask people who smoke to not smoke around you.  Avoid places that make you want (trigger) to smoke, such as: ? Bars. ? Parties. ? Smoke-break areas at work.  Spend time with people who do not smoke.  Lower the stress in your life. Stress can make you want to smoke. Try these things to help your stress: ? Getting regular exercise. ? Deep-breathing exercises. ? Yoga. ? Meditating. ? Doing a body scan. To do this, close your eyes, focus on one area of your body at a time from head to toe, and notice which parts of your body are tense. Try to relax the muscles in those areas.  Download or buy apps on your mobile phone or tablet that can help you stick to your quit plan. There are many free apps, such as QuitGuide from the [employer] Office manager for Disease Control and Prevention). You can find more support from smokefree.gov and other websites.  This information is not intended to replace advice given to you by your health care provider. Make sure you discuss any questions you have with your health care provider. Document  Released: 06/24/2009 Document Revised: 04/25/2016 Document Reviewed: 01/12/2015 Elsevier Interactive Patient Education  2018 [employer].

## 2017-11-30 LAB — BETA-2-GLYCOPROTEIN I ABS, IGG/M/A
Beta-2 Glyco I IgG: <9 GPI IgG units (ref 0–20)
Beta-2-Glycoprotein I IgM: <9 GPI IgM units (ref 0–32)

## 2017-11-30 LAB — PROTEIN C, TOTAL: Protein C, Total: 111 % (ref 60–150)

## 2017-11-30 LAB — DRVVT MIX: dRVVT Mix: 41.7 s (ref 0.0–47.0)

## 2017-11-30 LAB — LUPUS ANTICOAGULANT PANEL
DRVVT: 49.4 s — ABNORMAL HIGH (ref 0.0–47.0)
PTT LA: 30.6 s (ref 0.0–51.9)

## 2017-11-30 LAB — CARDIOLIPIN ANTIBODIES, IGG, IGM, IGA
Anticardiolipin IgA: <9 APL U/mL (ref 0–11)
Anticardiolipin IgG: <9 GPL U/mL (ref 0–14)
Anticardiolipin IgM: <9 MPL U/mL (ref 0–12)

## 2017-11-30 LAB — PROTEIN S, TOTAL: PROTEIN S AG TOTAL: 91 % (ref 60–150)

## 2017-11-30 LAB — PROTEIN C ACTIVITY: Protein C Activity: 191 % — ABNORMAL HIGH (ref 73–180)

## 2017-11-30 LAB — PROTEIN S ACTIVITY: Protein S Activity: 71 % (ref 63–140)

## 2017-12-03 LAB — PROTHROMBIN GENE MUTATION

## 2017-12-03 LAB — FACTOR 5 LEIDEN

## 2017-12-12 ENCOUNTER — Telehealth: Payer: Self-pay | Admitting: Neurology

## 2017-12-12 ENCOUNTER — Encounter: Payer: Self-pay | Admitting: Neurology

## 2017-12-12 ENCOUNTER — Ambulatory Visit: Payer: Medicaid Other | Admitting: Neurology

## 2017-12-12 VITALS — BP 191/91 | HR 77 | Ht 66.0 in | Wt 292.0 lb

## 2017-12-12 DIAGNOSIS — G4733 Obstructive sleep apnea (adult) (pediatric): Secondary | ICD-10-CM | POA: Diagnosis not present

## 2017-12-12 DIAGNOSIS — Z789 Other specified health status: Secondary | ICD-10-CM

## 2017-12-12 NOTE — Telephone Encounter (Signed)
Referral to dentistry placed per VO from Dr. Rexene Alberts.

## 2017-12-12 NOTE — Patient Instructions (Addendum)
I appreciate you trying CPAP, as your initial treatment option for obstructive sleep apnea. Unfortunately, you have struggled with CPAP. As discussed, an oral appliance may be another treatment option. As discussed, please talk your dentist, if they provide treatment for sleep apnea, we can send our records to them. Otherwise, we can make a referral to another dentist. Let us know.  Please work on weight loss, as losing weight will also help reduce your sleep apnea. I will see you back at this point on an as-needed basis.

## 2017-12-12 NOTE — Progress Notes (Signed)
fSubjective:    Patient ID: Sonya James is a 52 y.o. female.  HPI     Interim history:   Sonya James is a 52 year old right-handed woman with an underlying medical history of diabetes, hyperlipidemia, hypertension, asthma, COPD, smoking, vitamin D deficiency, IBS, recent diagnosis of endometrial cancer, s/p surgery, depression, anxiety, and morbid obesity, who presents for follow-up consultation of her sleep apnea, for her yearly FU. The patient is unaccompanied today. I last saw her on 10/26/2016, at which time she was advised regarding her sleep study results. She was suboptimal with her compliance and advised to follow-up soon for a compliance recheck.   She had a follow-up appointment with Ward Givens, nurse practitioner on 12/12/2017, at which time her CPAP compliance was 77% for more than 4 hours. She was advised to follow-up in one year.   Today, 12/12/2017: I reviewed her CPAP compliance data for the past 4 months and she has not been using her CPAP machine. In the past year she has used her CPAP 42 out of 360 days with percent used days greater than 4 hours at 5% only. She reports that the CPAP was causing frequent sinus infections. Of note, she was recently diagnosed with bilateral pulmonary embolism. She reports that she cannot exercise as she cannot afford a gym membership. She is trying to elevate her legs when sedentary to reduce the leg swelling.   The patient's allergies, current medications, family history, past medical history, past social history, past surgical history and problem list were reviewed and updated as appropriate.   Previously (copied from previous notes for reference):   I first met her on 06/19/2016 at the request of her primary care provider, at which time the patient reported snoring and excessive daytime somnolence. I invited her for sleep study. She had a baseline sleep study, followed by a CPAP titration study. Her baseline sleep study from 07/11/2016  showed a sleep efficiency of 82.4% with a sleep latency of 21.5 minutes, REM latency mildly prolonged at 156 minutes, wake after sleep onset was 62 minutes with mild to moderate sleep fragmentation noted. She had absence of slow-wave sleep and REM sleep was mildly reduced at 15%, stage II increased at 79.7%. She had two restroom breaks. She had mild to moderate snoring, EKG was in keeping with normal sinus rhythm. Total AHI was 9.2 per hour, REM AHI was 47.2 per hour, supine AHI was 9 per hour. Average oxygen saturation was 96%, nadir was 77%, time below 89% saturation was 26 minutes for the night. She had moderate PLMS with an index of 33.5 per hour, without significant arousals. Based on her test results and her sleep-related complaints I invited her for a full night CPAP titration study. She had this on 07/25/2016. Sleep efficiency was 6.8%, sleep latency 7.5 minutes and REM latency was prolonged at 334 minutes. She had a reduced amount of REM sleep at 8.8%, stage III was absent, light stage sleep with stage II was highly elevated at 84%. She had no significant PLMS. She was fitted with a medium nasal pillows interface. CPAP was titrated from 5 cm to 9 cm. AHI was 0 per hour on the final pressure with an O2 nadir of 90%, nonsupine REM sleep achieved. Average oxygen saturation was 95%, nadir was 88% for the night. Based on her test results I prescribed CPAP therapy for home use.   I reviewed her CPAP compliance data from 09/24/2016 through 10/23/2016, which is a total of 30 days, during  which time she used her CPAP 27 days with percent used days greater than 4 hours at 47% only, indicating suboptimal compliance with an average usage of 4 hours and 6 minutes, residual AHI low at 1 per hour, leak acceptable with the 95th percentile at 15.3 L/m on a pressure of 9 cm with EPR of 3.    06/19/2016: She reports snoring and excessive daytime somnolence. I reviewed your office note from 03/13/2016 and 01/13/2016,  which kindly included. She also reports occasional morning headaches. Her Epworth sleepiness score is high at 23 out of 24 today, the fatigue score is high at 55 out of 63. She has been referred to bariatric surgery. She reports tingling in the bottom of her feet, had foot surgery on the L and was noted by the podiatrist to have apneas. Per husband, she makes gasping noises. She has woken up with a sense of panic, palpitations and gasping for air.  She has nocturia twice per night. She endorses RLS symptoms and leg movements at night.  She lives at her mom's house with her husband and her brother stays there too. There is stress in the household. She has one grown son.   She does not work. She used to work in a factory. She smokes about 2-3 cigarettes per day currently. She does not drink alcohol. She drinks quite a bit of caffeine in the form of [facility], 2-3 bottles per day. Bedtime is around 10 PM and wakeup time is around 7 AM.  Her Past Medical History Is Significant For: Past Medical History:  Diagnosis Date  . Anemia   . Anxiety   . Arthritis   . Asthma   . Autonomic neuropathy   . Constipation   . COPD (chronic obstructive pulmonary disease) ([place])   . CTS (carpal tunnel syndrome)   . Depression   . Diabetes mellitus without complication ([place])   . Endometrial cancer ([place]) 2017  . GERD (gastroesophageal reflux disease)   . Headache(784.0)   . HTN (hypertension)   . Hypercholesterolemia   . Hypothyroidism   . IBS (irritable bowel syndrome)   . Osteoarthritis    osteoarthritis  . Pancreatitis    per patient   . Recurrent boils    buttocks and low back.  . Shortness of breath   . Sleep apnea    CPAP machine  . Uterine cancer ([place]) 2017  . Vitamin D deficiency     Her Past Surgical History Is Significant For: Past Surgical History:  Procedure Laterality Date  . ABDOMINAL HYSTERECTOMY  2018   endometrial cancer, surgery done at [facility]   . BIOPSY  01/02/2017    Procedure: BIOPSY;  Surgeon: Danie Binder, MD;  [facility];  Service: Endoscopy;;  gastric biopsy  . CESAREAN SECTION  1989  . COLONOSCOPY WITH PROPOFOL N/A 01/02/2017   Dr. Oneida Alar: redundant colon, two 2-3 mm polyps in sigmoid colon (hyperplastic)  . ESOPHAGOGASTRODUODENOSCOPY  05/22/2011   Dr. Oneida Alar: H.pylori gastritis   . ESOPHAGOGASTRODUODENOSCOPY (EGD) WITH PROPOFOL N/A 01/02/2017   Dr. 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 FOOT SURGERY Left    tendon repair  . OOPHORECTOMY    . POLYPECTOMY  01/02/2017   Procedure: POLYPECTOMY;  Surgeon: Danie Binder, MD;  [facility];  Service: Endoscopy;;  sigmoid colon polyps times 2  . REDUCTION MAMMAPLASTY  1996  . SAVORY DILATION N/A 01/02/2017   Procedure: SAVORY DILATION;  Surgeon: Danie Binder, MD;  [facility];  Service: Endoscopy;  Laterality: N/A;  . TUBAL LIGATION      Her Family History Is Significant For: Family History  Problem Relation Age of Onset  . Diabetes Mother   . Hypertension Mother   . COPD Mother   . Asthma Mother   . Hypercholesterolemia Mother   . Hypertension Sister   . Hyperlipidemia Sister   . Diabetes Sister   . Asthma Brother   . Alcohol abuse Brother   . Asthma Son   . Cancer Maternal Grandmother        lung, throat, breast  . Alzheimer's disease Maternal Grandmother   . Hypertension Maternal Grandmother   . Hypercholesterolemia Maternal Grandmother   . Heart disease Maternal Grandfather   . Stroke Maternal Grandfather   . Clotting disorder Maternal Grandfather        blood clots in legs  . Hypertension Sister   . Hyperlipidemia Sister   . Stroke Maternal Aunt   . Clotting disorder Maternal Aunt        blood clots in legs  . Hypertension Maternal Aunt   . Hypercholesterolemia Maternal Aunt   . Hypertension Maternal Aunt   . Asthma Maternal Aunt   . Clotting disorder Maternal Uncle        blood  clot -legs traveled to heart and he died of heart attack  . Colon cancer Neg Hx     Her Social History Is Significant For: Social History   Socioeconomic History  . Marital status: Married    Spouse name: Not on file  . Number of children: 1  . Years of education: Not on file  . Highest education level: Not on file  Occupational History  . Not on file  Social Needs  . Financial resource strain: Not on file  . Food insecurity:    Worry: Not on file    Inability: Not on file  . Transportation needs:    Medical: Not on file    Non-medical: Not on file  Tobacco Use  . Smoking status: Current Some Day Smoker    Packs/day: 0.50    Years: 22.00    Pack years: 11.00    Types: Cigarettes  . Smokeless tobacco: Never Used  Substance and Sexual Activity  . Alcohol use: No  . Drug use: No  . Sexual activity: Yes    Birth control/protection: Surgical    Comment: hyst  Lifestyle  . Physical activity:    Days per week: Not on file    Minutes per session: Not on file  . Stress: Not on file  Relationships  . Social connections:    Talks on phone: Not on file    Gets together: Not on file    Attends religious service: Not on file    Active member of club or organization: Not on file    Attends meetings of clubs or organizations: Not on file    Relationship status: Not on file  Other Topics Concern  . Not on file  Social History Narrative   Drinks coffee occasionally, soda 2-3 daily     Her Allergies Are:  No Known Allergies:   Her Current Medications Are:  Outpatient Encounter Medications as of 12/12/2017  Medication Sig  . albuterol (PROVENTIL HFA;VENTOLIN HFA) 108 (90 Base) MCG/ACT inhaler Inhale 2 puffs into the lungs every 6 (six) hours as needed for wheezing or shortness of breath.  [name] albuterol (PROVENTIL) (2.5 MG/3ML)  0.083% nebulizer solution Take 3 mLs (2.5 mg total) by nebulization every 6 (six) hours as needed for wheezing.  [name] apixaban (ELIQUIS) 5 MG TABS tablet  Take 2 tablets (10 mg total) by mouth 2 (two) times daily. On 08/12/17 @ 9PM, start 1 tab (5 mg) two times daily. (Patient taking differently: Take 5 mg by mouth 2 (two) times daily. On 08/12/17 @ 9PM, start 1 tab (5 mg) two times daily.)  . atorvastatin (LIPITOR) 40 MG tablet Take 40 mg by mouth daily.  . cycloSPORINE (RESTASIS) 0.05 % ophthalmic emulsion Place 1 drop into both eyes daily.  [name] esomeprazole (NEXIUM) 40 MG capsule Take 1 capsule (40 mg total) by mouth 2 (two) times daily before a meal.  . furosemide (LASIX) 40 MG tablet Take 1 tablet (40 mg total) by mouth daily.  [name] gabapentin (NEURONTIN) 300 MG capsule Take 300 mg by mouth 3 (three) times daily.  . hydrocortisone 1 % ointment Apply 1 application topically 2 (two) times daily.  . insulin NPH-regular Human (NOVOLIN 70/30 RELION) (70-30) 100 UNIT/ML injection Inject 50 units into skin three times daily (breakfast, supper, bedtime)  . linaclotide (LINZESS) 290 MCG CAPS capsule Take 1 capsule (290 mcg total) by mouth daily before breakfast.  . lisinopril (PRINIVIL,ZESTRIL) 20 MG tablet Take 1 tablet (20 mg total) by mouth daily.  . metFORMIN (GLUCOPHAGE) 1000 MG tablet Take 1 tablet (1,000 mg total) by mouth 2 (two) times daily with a meal.  . montelukast (SINGULAIR) 10 MG tablet Take 10 mg by mouth at bedtime.   [name] nystatin (MYCOSTATIN) 100000 UNIT/ML suspension Take 5 mLs (500,000 Units total) by mouth 4 (four) times daily.  . ondansetron (ZOFRAN ODT) 4 MG disintegrating tablet Take 4 mg by mouth every 8 (eight) hours as needed for nausea or vomiting.   . potassium chloride (K-DUR) 10 MEQ tablet Take 10 mEq by mouth every evening.   . sitaGLIPtin (JANUVIA) 100 MG tablet Take 100 mg by mouth daily.  . Vitamin D, Ergocalciferol, (DRISDOL) 50000 units CAPS capsule Take 50,000 Units by mouth every 7 (seven) days.   No facility-administered encounter medications on file as of 12/12/2017.   :  Review of Systems:  Out of a complete 14 point  review of systems, all are reviewed and negative with the exception of these symptoms as listed below:  Review of Systems  Neurological:       Pt presents today to discuss her cpap. Pt says that her cpap is causing her to have frequent sinus infections. Pt reports cleaning her cpap well with dish detergent and soapy water.    Objective:  Neurological Exam  Physical Exam Physical Examination:   Vitals:   12/12/17 1114  BP: (!) 191/91  Pulse: 77    General Examination: The patient is a very pleasant 52 y.o. female in no acute distress. She appears well-developed and well-nourished and adequately groomed.   HEENT: Normocephalic, atraumatic, pupils are equal, round and reactive to light and accommodation. Corrective eyeglasses in place. Face is symmetric with normal facial animation and normal facial sensation. Speech is clear with no dysarthria noted. There is no hypophonia. There is no lip, neck/head, jaw or voice tremor.  Chest: Clear to auscultation without wheezing, rhonchi or crackles noted.  Heart: S1+S2+0, regular and normal without murmurs, rubs or gallops noted.   Abdomen: Soft, non-tender and non-distended with normal bowel sounds appreciated on auscultation.  Extremities: There is edema in the distal lower extremities bilaterally.  Skin: Warm and  dry without trophic changes noted.   Musculoskeletal: exam reveals no obvious joint deformities.   Neurologically:  Mental status: The patient is awake, alert and oriented in all 4 spheres. Her immediate and remote memory, attention, language skills and fund of knowledge are appropriate. There is no evidence of aphasia, agnosia, apraxia or anomia. Speech is clear with normal prosody and enunciation. Cranial nerves II - XII are as described above under HEENT exam.  Motor exam: Normal bulk, strength and tone is noted. There is no tremor.  Cerebellar testing: No dysmetria or intention tremor. There is no truncal or gait  ataxia.  Sensory exam: intact to light touch in the upper and lower extremities.  Gait, station and balance: She walks without difficulty.   Assessment and Plan:  In summary, Sonya James is a very pleasant 52 year old female  with an underlying medical history of diabetes, irritable bowel syndrome, hypertension, hyperlipidemia, asthma, COPD, smoking, vitamin D deficiency, endometrial cancer with status post surgery, history of depression and anxiety as well as morbid obesity, who returns for follow-up consultation of her obstructive sleep apnea. Her baseline sleep study from October 2017 showed overall mild obstructive sleep apnea, significant REM related sleep apnea was noted. She did well on CPAP therapy during her CPAP titration study in November 2017. She was compliant with CPAP and the very first month or 2 but has not been able to tolerate CPAP. She reported issues with recurrent sinus infections and nasal dryness. She is advised regarding alternative treatment options for sleep apnea. She is strongly advised to work hard on weight loss as this may help. Furthermore, she is encouraged to talk to her dentist about potentially getting an oral appliance. If his office does not provide treatment for sleep apnea we would be happy to make a referral to another dentist practice for consideration of an oral appliance. She is encouraged to call us back in that regard. I will see her back on an as-needed basis at this point. She is again advised regarding the symptoms of untreated sleep apnea in the benefits of treating sleep apnea.I answered all her questions today and she was in agreement. I spent 15 minutes in total face-to-face time with the patient, more than 50% of which was spent in counseling and coordination of care, reviewing test results, reviewing medication and discussing or reviewing the diagnosis of OSA, its prognosis and treatment options. Pertinent laboratory and imaging test results that were  available during this visit with the patient were reviewed by me and considered in my medical decision making (see chart for details).

## 2017-12-12 NOTE — Telephone Encounter (Signed)
Pt requesting Dr. Rexene Alberts send a referral to a dentist office regarding her sleep apnea, please call to advise

## 2017-12-13 NOTE — Telephone Encounter (Signed)
Sent referral to Dr. Toy Cookey sleep study attached . Spoke to Patient as well.  Telephone . [phone] - fax [phone].

## 2017-12-17 NOTE — Telephone Encounter (Signed)
I have called patient we will send to another office .

## 2017-12-17 NOTE — Telephone Encounter (Signed)
Sonya James at Dr. Betsey Holiday office called wanting to inform us that they don't accept medicaid, so the pt would need a different referral

## 2017-12-18 NOTE — Telephone Encounter (Signed)
Patient called back and stated Hold off on referral because she is going back on her C- Pap starting tonight and she is turing her machine down to 7 . Patient want's to give her C-Pap a chance.

## 2017-12-26 ENCOUNTER — Ambulatory Visit: Payer: Medicaid Other | Admitting: Obstetrics and Gynecology

## 2017-12-26 ENCOUNTER — Other Ambulatory Visit: Payer: Self-pay

## 2017-12-26 ENCOUNTER — Other Ambulatory Visit (HOSPITAL_COMMUNITY)
Admission: RE | Admit: 2017-12-26 | Discharge: 2017-12-26 | Disposition: A | Discharge status: Home / Self Care | Payer: Medicaid Other | Source: Ambulatory Visit | Attending: Obstetrics and Gynecology | Admitting: Obstetrics and Gynecology

## 2017-12-26 ENCOUNTER — Encounter: Payer: Self-pay | Admitting: Obstetrics and Gynecology

## 2017-12-26 VITALS — BP 146/82 | HR 94 | Ht 66.0 in | Wt 290.0 lb

## 2017-12-26 DIAGNOSIS — L918 Other hypertrophic disorders of the skin: Secondary | ICD-10-CM | POA: Diagnosis not present

## 2017-12-26 DIAGNOSIS — Z8542 Personal history of malignant neoplasm of other parts of uterus: Secondary | ICD-10-CM | POA: Diagnosis not present

## 2017-12-26 DIAGNOSIS — Z0001 Encounter for general adult medical examination with abnormal findings: Secondary | ICD-10-CM

## 2017-12-26 DIAGNOSIS — Z1212 Encounter for screening for malignant neoplasm of rectum: Secondary | ICD-10-CM | POA: Diagnosis not present

## 2017-12-26 DIAGNOSIS — Z1211 Encounter for screening for malignant neoplasm of colon: Secondary | ICD-10-CM

## 2017-12-26 DIAGNOSIS — E669 Obesity, unspecified: Secondary | ICD-10-CM

## 2017-12-26 DIAGNOSIS — Z6841 Body Mass Index (BMI) 40.0 and over, adult: Secondary | ICD-10-CM

## 2017-12-26 LAB — HEMOCCULT GUIAC POC 1CARD (OFFICE): Fecal Occult Blood, POC: NEGATIVE

## 2017-12-26 NOTE — Progress Notes (Signed)
Patient ID: Sonya James, female   DOB: September 02, 1966, 52 y.o.   MRN: [id number]  Assessment:  1. Annual Gyn Exam 2. Obese Plan:  1. pap smear done 2. return annually or prn 3. Annual mammogram advised after age 63 Subjective:  Sonya James is a 52 y.o. female G1P1001 who presents for annual exam. Patient's last menstrual period was 09/20/2015 (exact date). The patient has complaints of multiple skin tags on her face and body. She also has a mole on her vagina that she would like looked at. History of endometrial cancer which was removed when she had an abdominal hysterectomy at [facility]. Otherwise, she is doing well and she denies any other symptoms or complaints at this time.   The following portions of the patient's history were reviewed and updated as appropriate: allergies, current medications, past family history, past medical history, past social history, past surgical history and problem list. Past Medical History:  Diagnosis Date  . Anemia   . Anxiety   . Arthritis   . Asthma   . Autonomic neuropathy   . Constipation   . COPD (chronic obstructive pulmonary disease) ([place])   . CTS (carpal tunnel syndrome)   . Depression   . Diabetes mellitus without complication ([place])   . Endometrial cancer ([place]) 2017  . GERD (gastroesophageal reflux disease)   . Headache(784.0)   . HTN (hypertension)   . Hypercholesterolemia   . Hypothyroidism   . IBS (irritable bowel syndrome)   . Osteoarthritis    osteoarthritis  . Pancreatitis    per patient   . Recurrent boils    buttocks and low back.  . Shortness of breath   . Sleep apnea    CPAP machine  . Uterine cancer ([place]) 2017  . Vitamin D deficiency     Past Surgical History:  Procedure Laterality Date  . ABDOMINAL HYSTERECTOMY  2018   endometrial cancer, surgery done at [facility]   . BIOPSY  01/02/2017   Procedure: BIOPSY;  Surgeon: Danie Binder, MD;  [facility];  Service: Endoscopy;;  gastric biopsy  . CESAREAN  SECTION  1989  . COLONOSCOPY WITH PROPOFOL N/A 01/02/2017   Dr. Oneida Alar: redundant colon, two 2-3 mm polyps in sigmoid colon (hyperplastic)  . ESOPHAGOGASTRODUODENOSCOPY  05/22/2011   Dr. Oneida Alar: H.pylori gastritis   . ESOPHAGOGASTRODUODENOSCOPY (EGD) WITH PROPOFOL N/A 01/02/2017   Dr. 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 FOOT SURGERY Left    tendon repair  . OOPHORECTOMY    . POLYPECTOMY  01/02/2017   Procedure: POLYPECTOMY;  Surgeon: Danie Binder, MD;  [facility];  Service: Endoscopy;;  sigmoid colon polyps times 2  . REDUCTION MAMMAPLASTY  1996  . SAVORY DILATION N/A 01/02/2017   Procedure: SAVORY DILATION;  Surgeon: Danie Binder, MD;  [facility];  Service: Endoscopy;  Laterality: N/A;  . TUBAL LIGATION       Current Outpatient Medications:  .  albuterol (PROVENTIL HFA;VENTOLIN HFA) 108 (90 Base) MCG/ACT inhaler, Inhale 2 puffs into the lungs every 6 (six) hours as needed for wheezing or shortness of breath., Disp: , Rfl:  .  albuterol (PROVENTIL) (2.5 MG/3ML) 0.083% nebulizer solution, Take 3 mLs (2.5 mg total) by nebulization every 6 (six) hours as needed for wheezing., Disp: 75 mL, Rfl: 12 .  apixaban (ELIQUIS) 5 MG TABS tablet, Take 2 tablets (10 mg total) by mouth 2 (two) times daily. On  08/12/17 @ 9PM, start 1 tab (5 mg) two times daily. (Patient taking differently: Take 5 mg by mouth 2 (two) times daily. On 08/12/17 @ 9PM, start 1 tab (5 mg) two times daily.), Disp: 72 tablet, Rfl: 0 .  atorvastatin (LIPITOR) 40 MG tablet, Take 40 mg by mouth daily., Disp: , Rfl:  .  cycloSPORINE (RESTASIS) 0.05 % ophthalmic emulsion, Place 1 drop into both eyes daily., Disp: , Rfl:  .  esomeprazole (NEXIUM) 40 MG capsule, Take 1 capsule (40 mg total) by mouth 2 (two) times daily before a meal., Disp: 60 capsule, Rfl: 11 .  furosemide (LASIX) 40 MG tablet, Take 1 tablet (40 mg total) by mouth daily., Disp: 30  tablet, Rfl: 0 .  gabapentin (NEURONTIN) 300 MG capsule, Take 300 mg by mouth 3 (three) times daily., Disp: , Rfl:  .  hydrocortisone 1 % ointment, Apply 1 application topically 2 (two) times daily., Disp: 30 g, Rfl: prn .  insulin NPH-regular Human (NOVOLIN 70/30 RELION) (70-30) 100 UNIT/ML injection, Inject 50 units into skin three times daily (breakfast, supper, bedtime), Disp: 10 mL, Rfl: 1 .  linaclotide (LINZESS) 290 MCG CAPS capsule, Take 1 capsule (290 mcg total) by mouth daily before breakfast., Disp: 90 capsule, Rfl: 3 .  lisinopril (PRINIVIL,ZESTRIL) 20 MG tablet, Take 1 tablet (20 mg total) by mouth daily., Disp: 30 tablet, Rfl: 0 .  metFORMIN (GLUCOPHAGE) 1000 MG tablet, Take 1 tablet (1,000 mg total) by mouth 2 (two) times daily with a meal., Disp: 60 tablet, Rfl: 0 .  montelukast (SINGULAIR) 10 MG tablet, Take 10 mg by mouth at bedtime. , Disp: , Rfl:  .  nystatin (MYCOSTATIN) 100000 UNIT/ML suspension, Take 5 mLs (500,000 Units total) by mouth 4 (four) times daily., Disp: 473 mL, Rfl: 0 .  ondansetron (ZOFRAN ODT) 4 MG disintegrating tablet, Take 4 mg by mouth every 8 (eight) hours as needed for nausea or vomiting. , Disp: , Rfl:  .  potassium chloride (K-DUR) 10 MEQ tablet, Take 10 mEq by mouth every evening. , Disp: , Rfl:  .  sitaGLIPtin (JANUVIA) 100 MG tablet, Take 100 mg by mouth daily., Disp: , Rfl:  .  Vitamin D, Ergocalciferol, (DRISDOL) 50000 units CAPS capsule, Take 50,000 Units by mouth every 7 (seven) days., Disp: , Rfl:   Review of Systems Constitutional: negative Gastrointestinal: negative Genitourinary: neg   Objective:  BP (!) 146/82 (BP Location: Right Arm, Patient Position: Sitting, Cuff Size: Large)   Pulse 94   Ht 5\' 6"  (1.676 m)   Wt 290 lb (131.5 kg)   LMP 09/20/2015 (Exact Date)   BMI 46.81 kg/m    BMI: Body mass index is 46.81 kg/m.  General Appearance: Alert, appropriate appearance for age. No acute distress HEENT: Grossly normal Neck /  Thyroid:  Cardiovascular: RRR; normal S1, S2, no murmur Lungs: CTA bilaterally Back: No CVAT Breast Exam: deferred Gastrointestinal: Soft, non-tender, no masses or organomegaly Pelvic Exam:  External genitalia: normal general appearance and small sebaceous cyst on the posterior fourchette at 7:00 Vaginal: normal mucosa without prolapse or lesions Cervix: removed surgically Uterus: removed surgically Rectovaginal: not indicated and guaiac negative stool obtained Lymphatic Exam: Non-palpable nodes in neck, clavicular, axillary, or inguinal regions Skin: no rash or abnormalities Neurologic: Normal gait and speech, no tremor  Psychiatric: Alert and oriented, appropriate affect.  Urinalysis:Not done  By signing my name below, I, Margit Banda, attest that this documentation has been prepared under the direction and in the presence of  Jonnie Kind, MD. Electronically Signed: Margit Banda, Medical Scribe. 12/26/17. 2:20 PM.  I personally performed the services described in this documentation, which was SCRIBED in my presence. The recorded information has been reviewed and considered accurate. It has been edited as necessary during review. Jonnie Kind, MD

## 2017-12-27 ENCOUNTER — Inpatient Hospital Stay (HOSPITAL_COMMUNITY): Payer: Medicaid Other | Attending: Hematology | Admitting: Hematology

## 2017-12-27 ENCOUNTER — Other Ambulatory Visit: Payer: Self-pay

## 2017-12-27 ENCOUNTER — Encounter (HOSPITAL_COMMUNITY): Payer: Self-pay | Admitting: Hematology

## 2017-12-27 VITALS — BP 160/92 | HR 81 | Resp 18 | Wt 289.8 lb

## 2017-12-27 DIAGNOSIS — Z794 Long term (current) use of insulin: Secondary | ICD-10-CM | POA: Insufficient documentation

## 2017-12-27 DIAGNOSIS — E559 Vitamin D deficiency, unspecified: Secondary | ICD-10-CM | POA: Insufficient documentation

## 2017-12-27 DIAGNOSIS — E039 Hypothyroidism, unspecified: Secondary | ICD-10-CM

## 2017-12-27 DIAGNOSIS — J45909 Unspecified asthma, uncomplicated: Secondary | ICD-10-CM | POA: Diagnosis not present

## 2017-12-27 DIAGNOSIS — M1990 Unspecified osteoarthritis, unspecified site: Secondary | ICD-10-CM | POA: Insufficient documentation

## 2017-12-27 DIAGNOSIS — M129 Arthropathy, unspecified: Secondary | ICD-10-CM | POA: Insufficient documentation

## 2017-12-27 DIAGNOSIS — E7800 Pure hypercholesterolemia, unspecified: Secondary | ICD-10-CM | POA: Insufficient documentation

## 2017-12-27 DIAGNOSIS — Z79899 Other long term (current) drug therapy: Secondary | ICD-10-CM | POA: Diagnosis not present

## 2017-12-27 DIAGNOSIS — R42 Dizziness and giddiness: Secondary | ICD-10-CM | POA: Diagnosis not present

## 2017-12-27 DIAGNOSIS — Z7901 Long term (current) use of anticoagulants: Secondary | ICD-10-CM | POA: Diagnosis not present

## 2017-12-27 DIAGNOSIS — R5383 Other fatigue: Secondary | ICD-10-CM

## 2017-12-27 DIAGNOSIS — G4730 Sleep apnea, unspecified: Secondary | ICD-10-CM | POA: Diagnosis not present

## 2017-12-27 DIAGNOSIS — E119 Type 2 diabetes mellitus without complications: Secondary | ICD-10-CM | POA: Diagnosis not present

## 2017-12-27 DIAGNOSIS — F418 Other specified anxiety disorders: Secondary | ICD-10-CM

## 2017-12-27 DIAGNOSIS — K5900 Constipation, unspecified: Secondary | ICD-10-CM | POA: Diagnosis not present

## 2017-12-27 DIAGNOSIS — K219 Gastro-esophageal reflux disease without esophagitis: Secondary | ICD-10-CM | POA: Insufficient documentation

## 2017-12-27 DIAGNOSIS — F17210 Nicotine dependence, cigarettes, uncomplicated: Secondary | ICD-10-CM | POA: Insufficient documentation

## 2017-12-27 DIAGNOSIS — I2699 Other pulmonary embolism without acute cor pulmonale: Secondary | ICD-10-CM | POA: Diagnosis present

## 2017-12-27 DIAGNOSIS — I10 Essential (primary) hypertension: Secondary | ICD-10-CM | POA: Diagnosis not present

## 2017-12-27 DIAGNOSIS — J449 Chronic obstructive pulmonary disease, unspecified: Secondary | ICD-10-CM | POA: Insufficient documentation

## 2017-12-27 DIAGNOSIS — L299 Pruritus, unspecified: Secondary | ICD-10-CM | POA: Diagnosis not present

## 2017-12-27 DIAGNOSIS — F419 Anxiety disorder, unspecified: Secondary | ICD-10-CM | POA: Insufficient documentation

## 2017-12-27 DIAGNOSIS — Z8542 Personal history of malignant neoplasm of other parts of uterus: Secondary | ICD-10-CM | POA: Diagnosis not present

## 2017-12-27 DIAGNOSIS — G629 Polyneuropathy, unspecified: Secondary | ICD-10-CM | POA: Diagnosis not present

## 2017-12-27 NOTE — Progress Notes (Signed)
[address] [place], [place] [address]   CLINIC:  Medical Oncology/Hematology  PCP:  Rogers Blocker, MD [place] [address] [phone]   REASON FOR VISIT:  Follow-up for pulmonary embolism.  CURRENT THERAPY: Eliquis 5 mg twice daily.   INTERVAL HISTORY:  [name] 52 y.o. female returns for follow-up of blood work done for pulmonary embolism.  She has an unprovoked pulmonary embolism in November 2018.  Since then she has been taking Eliquis 5 mg twice daily without any major problems.  She also reports having endometrial cancer which was resected, as it was localized.  She did not have any risk factors for development of pulmonary embolism at that time.  She denied any miscarriages.  No relevant family history was noted.  I have ordered some blood work at last visit.   REVIEW OF SYSTEMS:  Review of Systems  Constitutional: Positive for fatigue.  Gastrointestinal: Positive for constipation and nausea.  Skin: Positive for itching.  Neurological: Positive for dizziness.  All other systems reviewed and are negative.    PAST MEDICAL/SURGICAL HISTORY:  Past Medical History:  Diagnosis Date  . Anemia   . Anxiety   . Arthritis   . Asthma   . Autonomic neuropathy   . Constipation   . COPD (chronic obstructive pulmonary disease) ([place])   . CTS (carpal tunnel syndrome)   . Depression   . Diabetes mellitus without complication ([place])   . Endometrial cancer ([place]) 2017  . GERD (gastroesophageal reflux disease)   . Headache(784.0)   . HTN (hypertension)   . Hypercholesterolemia   . Hypothyroidism   . IBS (irritable bowel syndrome)   . Osteoarthritis    osteoarthritis  . Pancreatitis    per patient   . Recurrent boils    buttocks and low back.  . Shortness of breath   . Sleep apnea    CPAP machine  . Uterine cancer ([place]) 2017  . Vitamin D deficiency    Past Surgical History:  Procedure Laterality Date  . ABDOMINAL  HYSTERECTOMY  2018   endometrial cancer, [facility]   . BIOPSY  01/02/2017   Procedure: BIOPSY;  Surgeon: Danie Binder, MD;  [facility];  Service: Endoscopy;;  gastric biopsy  . CESAREAN SECTION  1989  . COLONOSCOPY WITH PROPOFOL N/A 01/02/2017   Dr. Oneida Alar: redundant colon, two 2-3 mm polyps in sigmoid colon (hyperplastic)  . ESOPHAGOGASTRODUODENOSCOPY  05/22/2011   Dr. Oneida Alar: H.pylori gastritis   . ESOPHAGOGASTRODUODENOSCOPY (EGD) WITH PROPOFOL N/A 01/02/2017   Dr. 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 FOOT SURGERY Left    tendon repair  . OOPHORECTOMY    . POLYPECTOMY  01/02/2017   Procedure: POLYPECTOMY;  Surgeon: Danie Binder, MD;  Location: [facility];  Service: Endoscopy;;  sigmoid colon polyps times 2  . REDUCTION MAMMAPLASTY  1996  . SAVORY DILATION N/A 01/02/2017   Procedure: SAVORY DILATION;  Surgeon: Danie Binder, MD;  [facility]: [facility];  Service: Endoscopy;  Laterality: N/A;  . TUBAL LIGATION       SOCIAL HISTORY:  Social History   Socioeconomic History  . Marital status: Married    Spouse name: Not on file  . Number of children: 1  . Years of education: Not on file  . Highest education level: Not on file  Occupational History  . Not on file  Social Needs  . Financial resource strain: Not on file  . Food insecurity:    Worry: Not on file    Inability: Not on file  . Transportation needs:    Medical: Not on file    Non-medical: Not on file  Tobacco Use  . Smoking status: Current Some Day Smoker    Packs/day: 0.50    Years: 22.00    Pack years: 11.00    Types: Cigarettes  . Smokeless tobacco: Never Used  Substance and Sexual Activity  . Alcohol use: No  . Drug use: No  . Sexual activity: Yes    Birth control/protection: Surgical    Comment: hyst  Lifestyle  . Physical activity:    Days per week: Not on file    Minutes per session: Not on file  .  Stress: Not on file  Relationships  . Social connections:    Talks on phone: Not on file    Gets together: Not on file    Attends religious service: Not on file    Active member of club or organization: Not on file    Attends meetings of clubs or organizations: Not on file    Relationship status: Not on file  . Intimate partner violence:    Fear of current or ex partner: Not on file    Emotionally abused: Not on file    Physically abused: Not on file    Forced sexual activity: Not on file  Other Topics Concern  . Not on file  Social History Narrative   Drinks coffee occasionally, soda 2-3 daily     FAMILY HISTORY:  Family History  Problem Relation Age of Onset  . Diabetes Mother   . Hypertension Mother   . COPD Mother   . Asthma Mother   . Hypercholesterolemia Mother   . Hypertension Sister   . Hyperlipidemia Sister   . Diabetes Sister   . Asthma Brother   . Alcohol abuse Brother   . Asthma Son   . Cancer Maternal Grandmother        lung, throat, breast  . Alzheimer's disease Maternal Grandmother   . Hypertension Maternal Grandmother   . Hypercholesterolemia Maternal Grandmother   . Heart disease Maternal Grandfather   . Stroke Maternal Grandfather   . Clotting disorder Maternal Grandfather        blood clots in legs  . Hypertension Sister   . Hyperlipidemia Sister   . Stroke Maternal Aunt   . Clotting disorder Maternal Aunt        blood clots in legs  . Hypertension Maternal Aunt   . Hypercholesterolemia Maternal Aunt   . Hypertension Maternal Aunt   . Asthma Maternal Aunt   . Clotting disorder Maternal Uncle        blood clot -legs traveled to heart and he died of heart attack  . Colon cancer Neg Hx     CURRENT MEDICATIONS:  Outpatient Encounter Medications as of 12/27/2017  Medication Sig  . albuterol (PROVENTIL HFA;VENTOLIN HFA) 108 (90 Base) MCG/ACT inhaler Inhale 2 puffs into the lungs every 6 (six) hours as needed for wheezing or shortness of breath.    [name] albuterol (PROVENTIL) (2.5 MG/3ML) 0.083% nebulizer solution Take 3 mLs (2.5 mg total) by nebulization every 6 (six) hours as needed for wheezing.  [name] apixaban (ELIQUIS) 5 MG TABS tablet Take 2 tablets (10 mg total) by mouth 2 (two) times daily. On 08/12/17 @ 9PM, start 1 tab (5 mg) two times daily. (  Patient taking differently: Take 5 mg by mouth 2 (two) times daily. On 08/12/17 @ 9PM, start 1 tab (5 mg) two times daily.)  . atorvastatin (LIPITOR) 40 MG tablet Take 40 mg by mouth daily.  . cycloSPORINE (RESTASIS) 0.05 % ophthalmic emulsion Place 1 drop into both eyes daily.  [name] esomeprazole (NEXIUM) 40 MG capsule Take 1 capsule (40 mg total) by mouth 2 (two) times daily before a meal.  . furosemide (LASIX) 40 MG tablet Take 1 tablet (40 mg total) by mouth daily.  [name] gabapentin (NEURONTIN) 300 MG capsule Take 300 mg by mouth 3 (three) times daily.  . hydrocortisone 1 % ointment Apply 1 application topically 2 (two) times daily.  . insulin NPH-regular Human (NOVOLIN 70/30 RELION) (70-30) 100 UNIT/ML injection Inject 50 units into skin three times daily (breakfast, supper, bedtime)  . linaclotide (LINZESS) 290 MCG CAPS capsule Take 1 capsule (290 mcg total) by mouth daily before breakfast.  . lisinopril (PRINIVIL,ZESTRIL) 20 MG tablet Take 1 tablet (20 mg total) by mouth daily.  . metFORMIN (GLUCOPHAGE) 1000 MG tablet Take 1 tablet (1,000 mg total) by mouth 2 (two) times daily with a meal.  . montelukast (SINGULAIR) 10 MG tablet Take 10 mg by mouth at bedtime.   [name] nystatin (MYCOSTATIN) 100000 UNIT/ML suspension Take 5 mLs (500,000 Units total) by mouth 4 (four) times daily.  . ondansetron (ZOFRAN ODT) 4 MG disintegrating tablet Take 4 mg by mouth every 8 (eight) hours as needed for nausea or vomiting.   . potassium chloride (K-DUR) 10 MEQ tablet Take 10 mEq by mouth every evening.   . sitaGLIPtin (JANUVIA) 100 MG tablet Take 100 mg by mouth daily.  . Vitamin D, Ergocalciferol, (DRISDOL) 50000 units  CAPS capsule Take 50,000 Units by mouth every 7 (seven) days.  . naproxen (NAPROSYN) 500 MG tablet Take 500 mg by mouth 2 (two) times daily with a meal.    No facility-administered encounter medications on file as of 12/27/2017.     ALLERGIES:  No Known Allergies   PHYSICAL EXAM:  ECOG Performance status: 1  Vitals:   12/27/17 1343  BP: (!) 160/92  Pulse: 81  Resp: 18  SpO2: 100%   Filed Weights   12/27/17 1343  Weight: 289 lb 12.8 oz (131.5 kg)      LABORATORY DATA:  I have reviewed the labs as listed.  CBC    Component Value Date/Time   WBC 9.2 08/05/2017 0812   RBC 4.33 08/05/2017 0812   HGB 13.0 08/05/2017 0812   HCT 41.0 08/05/2017 0812   PLT 251 08/05/2017 0812   MCV 94.7 08/05/2017 0812   MCH 30.0 08/05/2017 0812   MCHC 31.7 08/05/2017 0812   RDW 12.6 08/05/2017 0812   LYMPHSABS 2.6 08/03/2017 1930   MONOABS 0.5 08/03/2017 1930   EOSABS 0.2 08/03/2017 1930   BASOSABS 0.0 08/03/2017 1930   CMP Latest Ref Rng & Units 08/06/2017 08/05/2017 08/04/2017  Glucose 65 - 99 mg/dL 272(H) 236(H) 449(H)  BUN 6 - 20 mg/dL 8 9 8   Creatinine 0.44 - 1.00 mg/dL 0.77 0.67 0.78  Sodium 135 - 145 mmol/L 137 139 134(L)  Potassium 3.5 - 5.1 mmol/L 3.7 3.7 3.8  Chloride 101 - 111 mmol/L 102 106 101  CO2 22 - 32 mmol/L 28 26 25   Calcium 8.9 - 10.3 mg/dL 9.6 9.7 9.7  Total Protein 6.5 - 8.1 g/dL - - 6.7  Total Bilirubin 0.3 - 1.2 mg/dL - - 0.4  Alkaline Phos 38 -  126 U/L - - 116  AST 15 - 41 U/L - - 17  ALT 14 - 54 U/L - - 13(L)      ASSESSMENT & PLAN:   Pulmonary embolism (HCC) 1.  Unprovoked pulmonary embolism: - Diagnosed in November 2018, on Eliquis 5 mg twice daily, without any bleeding complications. -She did not have any risk factors for pulmonary embolism. - Her blood work was negative for lupus anticoagulant, anticardiolipin antibodies, anti-beta-2 glycoprotein 1 antibodies.  It was also negative for other tests like Antithrombin III, factor V [place], [place]  20210A mutation, protein C and protein S deficiencies. -As she had history of endometrial cancer, I have recommended obtaining a CT scan of the chest, abdomen and pelvis to rule out any metastatic disease.  If the CT scans are negative, and after she completes 6-12 months of full dose anticoagulation, I would recommend her decreased dose anticoagulation with Eliquis 2.5 mg twice daily indefinitely.  As she had unprovoked pulmonary embolism, her chance of recurrent DVT is about 8% at ear 1 and about 40% at year 5.  She will come back after the scans to discuss the results.  2.  She is concerned that her weight gain is secondary to ups and downs of her thyroid levels.  She would also like to be on weight loss pills.  I have recommended to talk to Dr. Marlou Sa about it.      Orders placed this encounter:  Orders Placed This Encounter  Procedures  . CT Abdomen Pelvis W Contrast  . CT Chest W Contrast      Derek Jack, MD [place] [phone]

## 2017-12-27 NOTE — Assessment & Plan Note (Signed)
1.  Unprovoked pulmonary embolism: - Diagnosed in November 2018, on Eliquis 5 mg twice daily, without any bleeding complications. -She did not have any risk factors for pulmonary embolism. - Her blood work was negative for lupus anticoagulant, anticardiolipin antibodies, anti-beta-2 glycoprotein 1 antibodies.  It was also negative for other tests like Antithrombin III, factor V [place], [place] 20210A mutation, protein C and protein S deficiencies. -As she had history of endometrial cancer, I have recommended obtaining a CT scan of the chest, abdomen and pelvis to rule out any metastatic disease.  If the CT scans are negative, and after she completes 6-12 months of full dose anticoagulation, I would recommend her decreased dose anticoagulation with Eliquis 2.5 mg twice daily indefinitely.  As she had unprovoked pulmonary embolism, her chance of recurrent DVT is about 8% at ear 1 and about 40% at year 5.  She will come back after the scans to discuss the results.  2.  She is concerned that her weight gain is secondary to ups and downs of her thyroid levels.  She would also like to be on weight loss pills.  I have recommended to talk to Dr. Marlou Sa about it.

## 2017-12-31 LAB — CYTOLOGY - PAP
Diagnosis: NEGATIVE
HPV (WINDOPATH): NOT DETECTED

## 2018-01-23 ENCOUNTER — Encounter: Payer: Self-pay | Admitting: Obstetrics and Gynecology

## 2018-01-23 ENCOUNTER — Ambulatory Visit: Payer: Medicaid Other | Admitting: Obstetrics and Gynecology

## 2018-01-23 VITALS — BP 138/84 | HR 76 | Ht 66.0 in | Wt 285.0 lb

## 2018-01-23 DIAGNOSIS — F17210 Nicotine dependence, cigarettes, uncomplicated: Secondary | ICD-10-CM | POA: Diagnosis not present

## 2018-01-23 DIAGNOSIS — E010 Iodine-deficiency related diffuse (endemic) goiter: Secondary | ICD-10-CM | POA: Diagnosis not present

## 2018-01-23 DIAGNOSIS — Z8544 Personal history of malignant neoplasm of other female genital organs: Secondary | ICD-10-CM

## 2018-01-23 DIAGNOSIS — L918 Other hypertrophic disorders of the skin: Secondary | ICD-10-CM | POA: Diagnosis not present

## 2018-01-23 DIAGNOSIS — C541 Malignant neoplasm of endometrium: Secondary | ICD-10-CM

## 2018-01-23 NOTE — Progress Notes (Signed)
Patient ID: Sonya James, female   DOB: Jan 10, 1966, 52 y.o.   MRN: [id number]   [facility] Visit  @DATE @            Patient name: Sonya James MRN [id number]  Date of birth: Aug 28, 1966  CC & HPI:  [name] is a 52 y.o. female presenting today for a follow-up after her annual gyn exam on 12/26/2017.  #1 she would like to reduce the visits to GYN oncology and follow-up of her endometrial cancer for which Dr. Cindie Laroche did a TAH/BSO at [facility] with good results.  She will meet with him next week and discuss how many of the visits can be done here at [facility]  #2 she would also like to have multiple skin tags removed from her face and neck.  She has approximately 15 rather sizable skin tags on her face and neck and at least 100 small 1 to 2 mm developing skin tags will be followed #3  She reports losing 5 pounds. She experiences hot flashes. [place] walks for exercise. She denies fever, chills or any other symptoms or complaints at this time.   She has an appointment next week with Dr. Clarene Essex. She will discuss with him the possibly of following up with my office to save on travel.   ROS:  ROS +skin tags +weight loss +hot flashes -fever -chills All systems are negative except as noted in the HPI and PMH.   Pertinent History Reviewed:   Reviewed: Significant for endometrial cancer, uterine cancer, abdominal hysterectomy Medical         Past Medical History:  Diagnosis Date  . Anemia   . Anxiety   . Arthritis   . Asthma   . Autonomic neuropathy   . Constipation   . COPD (chronic obstructive pulmonary disease) ([place])   . CTS (carpal tunnel syndrome)   . Depression   . Diabetes mellitus without complication ([place])   . Endometrial cancer ([place]) 2017  . GERD (gastroesophageal reflux disease)   . Headache(784.0)   . HTN (hypertension)   . Hypercholesterolemia   . Hypothyroidism   . IBS (irritable bowel syndrome)   . Osteoarthritis    osteoarthritis  . Pancreatitis     per patient   . Recurrent boils    buttocks and low back.  . Shortness of breath   . Sleep apnea    CPAP machine  . Uterine cancer ([place]) 2017  . Vitamin D deficiency                               Surgical Hx:    Past Surgical History:  Procedure Laterality Date  . ABDOMINAL HYSTERECTOMY  2018   endometrial cancer, [facility] done at [facility]   . BIOPSY  01/02/2017   Procedure: BIOPSY;  Surgeon: Danie Binder, MD;  [facility];  Service: Endoscopy;;  gastric biopsy  . CESAREAN SECTION  1989  . COLONOSCOPY WITH PROPOFOL N/A 01/02/2017   Dr. Oneida Alar: redundant colon, two 2-3 mm polyps in sigmoid colon (hyperplastic)  . ESOPHAGOGASTRODUODENOSCOPY  05/22/2011   Dr. Oneida Alar: H.pylori gastritis   . ESOPHAGOGASTRODUODENOSCOPY (EGD) WITH PROPOFOL N/A 01/02/2017   Dr. 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 FOOT SURGERY Left    tendon repair  . OOPHORECTOMY    . POLYPECTOMY  01/02/2017   Procedure: POLYPECTOMY;  Surgeon: Danie Binder, MD;  [facility];  Service: Endoscopy;;  sigmoid colon polyps times 2  . REDUCTION MAMMAPLASTY  1996  . SAVORY DILATION N/A 01/02/2017   Procedure: SAVORY DILATION;  Surgeon: Danie Binder, MD;  [facility];  Service: Endoscopy;  Laterality: N/A;  . TUBAL LIGATION     Medications: Reviewed & Updated - see associated section                       Current Outpatient Medications:  .  albuterol (PROVENTIL HFA;VENTOLIN HFA) 108 (90 Base) MCG/ACT inhaler, Inhale 2 puffs into the lungs every 6 (six) hours as needed for wheezing or shortness of breath., Disp: , Rfl:  .  albuterol (PROVENTIL) (2.5 MG/3ML) 0.083% nebulizer solution, Take 3 mLs (2.5 mg total) by nebulization every 6 (six) hours as needed for wheezing., Disp: 75 mL, Rfl: 12 .  apixaban (ELIQUIS) 5 MG TABS tablet, Take 2 tablets (10 mg total) by mouth 2 (two) times daily. On 08/12/17 @ 9PM, start 1 tab (5 mg)  two times daily. (Patient taking differently: Take 5 mg by mouth 2 (two) times daily. On 08/12/17 @ 9PM, start 1 tab (5 mg) two times daily.), Disp: 72 tablet, Rfl: 0 .  atorvastatin (LIPITOR) 40 MG tablet, Take 40 mg by mouth daily., Disp: , Rfl:  .  esomeprazole (NEXIUM) 40 MG capsule, Take 1 capsule (40 mg total) by mouth 2 (two) times daily before a meal., Disp: 60 capsule, Rfl: 11 .  furosemide (LASIX) 40 MG tablet, Take 1 tablet (40 mg total) by mouth daily., Disp: 30 tablet, Rfl: 0 .  gabapentin (NEURONTIN) 300 MG capsule, Take 300 mg by mouth 3 (three) times daily., Disp: , Rfl:  .  hydrocortisone 1 % ointment, Apply 1 application topically 2 (two) times daily., Disp: 30 g, Rfl: prn .  insulin NPH-regular Human (NOVOLIN 70/30 RELION) (70-30) 100 UNIT/ML injection, Inject 50 units into skin three times daily (breakfast, supper, bedtime), Disp: 10 mL, Rfl: 1 .  linaclotide (LINZESS) 290 MCG CAPS capsule, Take 1 capsule (290 mcg total) by mouth daily before breakfast., Disp: 90 capsule, Rfl: 3 .  lisinopril (PRINIVIL,ZESTRIL) 20 MG tablet, Take 1 tablet (20 mg total) by mouth daily., Disp: 30 tablet, Rfl: 0 .  metFORMIN (GLUCOPHAGE) 1000 MG tablet, Take 1 tablet (1,000 mg total) by mouth 2 (two) times daily with a meal., Disp: 60 tablet, Rfl: 0 .  montelukast (SINGULAIR) 10 MG tablet, Take 10 mg by mouth at bedtime. , Disp: , Rfl:  .  naproxen (NAPROSYN) 500 MG tablet, Take 500 mg by mouth 2 (two) times daily with a meal. , Disp: , Rfl: 5 .  nystatin (MYCOSTATIN) 100000 UNIT/ML suspension, Take 5 mLs (500,000 Units total) by mouth 4 (four) times daily., Disp: 473 mL, Rfl: 0 .  ondansetron (ZOFRAN ODT) 4 MG disintegrating tablet, Take 4 mg by mouth every 8 (eight) hours as needed for nausea or vomiting. , Disp: , Rfl:  .  potassium chloride (K-DUR) 10 MEQ tablet, Take 10 mEq by mouth every evening. , Disp: , Rfl:  .  sitaGLIPtin (JANUVIA) 100 MG tablet, Take 100 mg by mouth daily., Disp: , Rfl:   .  Vitamin D, Ergocalciferol, (DRISDOL) 50000 units CAPS capsule, Take 50,000 Units by mouth every 7 (seven) days., Disp: , Rfl:  .  cycloSPORINE (RESTASIS) 0.05 % ophthalmic emulsion, Place 1 drop into both eyes daily., Disp: , Rfl:  Social History: Reviewed -  reports that she has been smoking cigarettes.  She has a 11.00 pack-year smoking history. She has never used smokeless tobacco.  Objective Findings:  Vitals: Blood pressure 138/84, pulse 76, height 5\' 6"  (1.676 m), weight 285 lb (129.3 kg), last menstrual period 09/20/2015.  PHYSICAL EXAMINATION General appearance - alert, well appearing, and in no distress, oriented to person, place, and time and overweight Mental status - alert, oriented to person, place, and time, normal mood, behavior, speech, dress, motor activity, and thought processes, affect appropriate to mood  PELVIC DEFERRED  PROCEDURE Multiple (15) skin tags removed from her neck and face. Local in the face and topical freeze therapy on the neck used.   Band-Aids and Steri-Strips applied  Assessment & Plan:   A:  1. Multiple skin tags, face and neck, 15 tags removed 2. Hx of endometrial cancer 3. Morbid Obesity 4. Thyroid goiter  P:  1. Use neosporin prn 2. Weight loss effort to continue discussed importance of calorie reduction and activity increase 3. F/u 3 months regarding weight and to find out the plans regarding GYN cancer follow-up at this office 4. Review of Dr. Clarene Essex is 12/14/2017 note indicates that he is willing to alternate visits with his clinic and my office to reduce travel obligations.  The patient will clarify this at her next visit with Dr.  Clarene Essex   By signing my name below, I, Margit Banda, attest that this documentation has been prepared under the direction and in the presence of Jonnie Kind, MD. Electronically Signed: Margit Banda, Medical Scribe. 01/23/18. 10:51 AM.  I personally performed the services described in this  documentation, which was SCRIBED in my presence. The recorded information has been reviewed and considered accurate. It has been edited as necessary during review. Jonnie Kind, MD

## 2018-01-28 ENCOUNTER — Ambulatory Visit (HOSPITAL_COMMUNITY)
Admission: RE | Admit: 2018-01-28 | Discharge: 2018-01-28 | Disposition: A | Discharge status: Home / Self Care | Payer: Medicaid Other | Source: Ambulatory Visit | Attending: Hematology | Admitting: Hematology

## 2018-01-28 DIAGNOSIS — M160 Bilateral primary osteoarthritis of hip: Secondary | ICD-10-CM | POA: Diagnosis not present

## 2018-01-28 DIAGNOSIS — I2699 Other pulmonary embolism without acute cor pulmonale: Secondary | ICD-10-CM | POA: Insufficient documentation

## 2018-01-28 DIAGNOSIS — K760 Fatty (change of) liver, not elsewhere classified: Secondary | ICD-10-CM | POA: Diagnosis not present

## 2018-01-28 DIAGNOSIS — I700 Atherosclerosis of aorta: Secondary | ICD-10-CM | POA: Insufficient documentation

## 2018-01-28 DIAGNOSIS — I517 Cardiomegaly: Secondary | ICD-10-CM | POA: Insufficient documentation

## 2018-01-28 DIAGNOSIS — M47816 Spondylosis without myelopathy or radiculopathy, lumbar region: Secondary | ICD-10-CM | POA: Diagnosis not present

## 2018-01-28 LAB — POCT I-STAT CREATININE: CREATININE: 0.8 mg/dL (ref 0.44–1.00)

## 2018-01-28 MED ORDER — IOPAMIDOL (ISOVUE-300) INJECTION 61%
100.0000 mL | Freq: Once | INTRAVENOUS | Status: AC | PRN
  Administered 2018-01-28: 100 mL via INTRAVENOUS

## 2018-01-29 ENCOUNTER — Telehealth: Payer: Self-pay | Admitting: Obstetrics and Gynecology

## 2018-01-29 NOTE — Telephone Encounter (Signed)
Entered in error

## 2018-01-30 ENCOUNTER — Inpatient Hospital Stay (HOSPITAL_COMMUNITY): Payer: Medicaid Other | Attending: Hematology | Admitting: Hematology

## 2018-01-30 ENCOUNTER — Encounter (HOSPITAL_COMMUNITY): Payer: Self-pay | Admitting: Hematology

## 2018-01-30 ENCOUNTER — Other Ambulatory Visit: Payer: Self-pay

## 2018-01-30 VITALS — BP 151/82 | HR 82 | Temp 98.3°F | Resp 16 | Wt 282.0 lb

## 2018-01-30 DIAGNOSIS — L299 Pruritus, unspecified: Secondary | ICD-10-CM | POA: Insufficient documentation

## 2018-01-30 DIAGNOSIS — E559 Vitamin D deficiency, unspecified: Secondary | ICD-10-CM

## 2018-01-30 DIAGNOSIS — Z90710 Acquired absence of both cervix and uterus: Secondary | ICD-10-CM | POA: Insufficient documentation

## 2018-01-30 DIAGNOSIS — E7800 Pure hypercholesterolemia, unspecified: Secondary | ICD-10-CM | POA: Diagnosis not present

## 2018-01-30 DIAGNOSIS — E119 Type 2 diabetes mellitus without complications: Secondary | ICD-10-CM | POA: Diagnosis not present

## 2018-01-30 DIAGNOSIS — I2699 Other pulmonary embolism without acute cor pulmonale: Secondary | ICD-10-CM

## 2018-01-30 DIAGNOSIS — Z8542 Personal history of malignant neoplasm of other parts of uterus: Secondary | ICD-10-CM | POA: Insufficient documentation

## 2018-01-30 DIAGNOSIS — E039 Hypothyroidism, unspecified: Secondary | ICD-10-CM | POA: Diagnosis not present

## 2018-01-30 DIAGNOSIS — R1310 Dysphagia, unspecified: Secondary | ICD-10-CM | POA: Diagnosis not present

## 2018-01-30 DIAGNOSIS — R21 Rash and other nonspecific skin eruption: Secondary | ICD-10-CM | POA: Insufficient documentation

## 2018-01-30 DIAGNOSIS — I10 Essential (primary) hypertension: Secondary | ICD-10-CM

## 2018-01-30 DIAGNOSIS — F17210 Nicotine dependence, cigarettes, uncomplicated: Secondary | ICD-10-CM | POA: Diagnosis not present

## 2018-01-30 DIAGNOSIS — K5900 Constipation, unspecified: Secondary | ICD-10-CM | POA: Insufficient documentation

## 2018-01-30 DIAGNOSIS — G4730 Sleep apnea, unspecified: Secondary | ICD-10-CM | POA: Diagnosis not present

## 2018-01-30 DIAGNOSIS — G629 Polyneuropathy, unspecified: Secondary | ICD-10-CM | POA: Diagnosis not present

## 2018-01-30 DIAGNOSIS — Z794 Long term (current) use of insulin: Secondary | ICD-10-CM | POA: Insufficient documentation

## 2018-01-30 DIAGNOSIS — Z79899 Other long term (current) drug therapy: Secondary | ICD-10-CM | POA: Insufficient documentation

## 2018-01-30 DIAGNOSIS — J449 Chronic obstructive pulmonary disease, unspecified: Secondary | ICD-10-CM | POA: Insufficient documentation

## 2018-01-30 DIAGNOSIS — I2782 Chronic pulmonary embolism: Secondary | ICD-10-CM

## 2018-01-30 DIAGNOSIS — M1990 Unspecified osteoarthritis, unspecified site: Secondary | ICD-10-CM

## 2018-01-30 DIAGNOSIS — M129 Arthropathy, unspecified: Secondary | ICD-10-CM | POA: Insufficient documentation

## 2018-01-30 DIAGNOSIS — K589 Irritable bowel syndrome without diarrhea: Secondary | ICD-10-CM | POA: Insufficient documentation

## 2018-01-30 DIAGNOSIS — Z7901 Long term (current) use of anticoagulants: Secondary | ICD-10-CM

## 2018-01-30 DIAGNOSIS — D649 Anemia, unspecified: Secondary | ICD-10-CM | POA: Diagnosis not present

## 2018-01-30 DIAGNOSIS — R319 Hematuria, unspecified: Secondary | ICD-10-CM

## 2018-01-30 DIAGNOSIS — Z90722 Acquired absence of ovaries, bilateral: Secondary | ICD-10-CM | POA: Insufficient documentation

## 2018-01-30 DIAGNOSIS — Z8619 Personal history of other infectious and parasitic diseases: Secondary | ICD-10-CM | POA: Diagnosis not present

## 2018-01-30 DIAGNOSIS — F418 Other specified anxiety disorders: Secondary | ICD-10-CM | POA: Diagnosis not present

## 2018-01-30 DIAGNOSIS — K219 Gastro-esophageal reflux disease without esophagitis: Secondary | ICD-10-CM | POA: Insufficient documentation

## 2018-01-30 LAB — URINALYSIS, ROUTINE W REFLEX MICROSCOPIC
BILIRUBIN URINE: NEGATIVE
Bacteria, UA: NONE SEEN
Ketones, ur: NEGATIVE mg/dL
NITRITE: NEGATIVE
PH: 5 (ref 5.0–8.0)
Protein, ur: NEGATIVE mg/dL
SPECIFIC GRAVITY, URINE: 1.02 (ref 1.005–1.030)

## 2018-01-30 NOTE — Progress Notes (Signed)
Sonya James, [place] [address]   CLINIC:  Medical Oncology/Hematology  PCP:  Rogers Blocker, MD [place] [id number] [phone]   REASON FOR VISIT:  Follow-up for pulmonary embolism.  CURRENT THERAPY: Eliquis 5 mg twice daily.   INTERVAL HISTORY:  Sonya James 52 y.o. female returns for follow-up of PE.   Remains on Eliquis.    She reports hematuria, "all my life."  She states that she has "always peed blood."  Generally occurs every 3 days. States, "When I look in the toilet, I see a spot of blood in the toilet."  She has occasional dysuria, but generally no burning.  States that she has vaginal itching, which she attributes to having to wear an incontinence pad for "leaky bladder."  She does not have a urologist that she is aware of; we will check UA today and if it is positive for blood, then would like to refer her to urologist for further evaluation.    Denies blood in her stools, dark/tarry stools, and nosebleeds.    Recommend lifelong anticoagulation given unprovoked PE. Discussed risks/benefits of continuing anticoagulation.   Recent CT chest/abd/pelvis on 01/28/18 results reviewed. She does have h/o endometrial cancer; scans did not show evidence of recurrence.    REVIEW OF SYSTEMS:  Review of Systems  HENT:   Positive for trouble swallowing.   Respiratory: Positive for shortness of breath.   Gastrointestinal: Positive for constipation.  Skin: Positive for itching and rash.  Neurological: Positive for dizziness and numbness.  All other systems reviewed and are negative.    PAST MEDICAL/SURGICAL HISTORY:  Past Medical History:  Diagnosis Date  . Anemia   . Anxiety   . Arthritis   . Asthma   . Autonomic neuropathy   . Constipation   . COPD (chronic obstructive pulmonary disease) ([place])   . CTS (carpal tunnel syndrome)   . Depression   . Diabetes mellitus without complication ([place])   . Endometrial  cancer ([place]) 2017  . GERD (gastroesophageal reflux disease)   . Headache(784.0)   . HTN (hypertension)   . Hypercholesterolemia   . Hypothyroidism   . IBS (irritable bowel syndrome)   . Osteoarthritis    osteoarthritis  . Pancreatitis    per patient   . Recurrent boils    buttocks and low back.  . Shortness of breath   . Sleep apnea    CPAP machine  . Uterine cancer ([place]) 2017  . Vitamin D deficiency    Past Surgical History:  Procedure Laterality Date  . ABDOMINAL HYSTERECTOMY  2018   endometrial cancer, surgery done at [facility]   . BIOPSY  01/02/2017   Procedure: BIOPSY;  Surgeon: Danie Binder, MD;  Location: [facility];  Service: Endoscopy;;  gastric biopsy  . CESAREAN SECTION  1989  . COLONOSCOPY WITH PROPOFOL N/A 01/02/2017   Dr. Oneida Alar: redundant colon, two 2-3 mm polyps in sigmoid colon (hyperplastic)  . ESOPHAGOGASTRODUODENOSCOPY  05/22/2011   Dr. Oneida Alar: H.pylori gastritis   . ESOPHAGOGASTRODUODENOSCOPY (EGD) WITH PROPOFOL N/A 01/02/2017   Dr. 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 FOOT SURGERY Left    tendon repair  . OOPHORECTOMY    . POLYPECTOMY  01/02/2017   Procedure: POLYPECTOMY;  Surgeon: Danie Binder, MD;  Location: [facility];  Service: Endoscopy;;  sigmoid colon polyps times 2  . REDUCTION MAMMAPLASTY  1996  .  SAVORY DILATION N/A 01/02/2017   Procedure: SAVORY DILATION;  Surgeon: Danie Binder, MD;  Location: [facility];  Service: Endoscopy;  Laterality: N/A;  . TUBAL LIGATION       SOCIAL HISTORY:  Social History   Socioeconomic History  . Marital status: Married    Spouse name: Not on file  . Number of children: 1  . Years of education: Not on file  . Highest education level: Not on file  Occupational History  . Not on file  Social Needs  . Financial resource strain: Not on file  . Food insecurity:    Worry: Not on file    Inability: Not on file  . Transportation  needs:    Medical: Not on file    Non-medical: Not on file  Tobacco Use  . Smoking status: Current Some Day Smoker    Packs/day: 0.50    Years: 22.00    Pack years: 11.00    Types: Cigarettes  . Smokeless tobacco: Never Used  Substance and Sexual Activity  . Alcohol use: No  . Drug use: No  . Sexual activity: Yes    Birth control/protection: Surgical    Comment: hyst  Lifestyle  . Physical activity:    Days per week: Not on file    Minutes per session: Not on file  . Stress: Not on file  Relationships  . Social connections:    Talks on phone: Not on file    Gets together: Not on file    Attends religious service: Not on file    Active member of club or organization: Not on file    Attends meetings of clubs or organizations: Not on file    Relationship status: Not on file  . Intimate partner violence:    Fear of current or ex partner: Not on file    Emotionally abused: Not on file    Physically abused: Not on file    Forced sexual activity: Not on file  Other Topics Concern  . Not on file  Social History Narrative   Drinks coffee occasionally, soda 2-3 daily     FAMILY HISTORY:  Family History  Problem Relation Age of Onset  . Diabetes Mother   . Hypertension Mother   . COPD Mother   . Asthma Mother   . Hypercholesterolemia Mother   . Hypertension Sister   . Hyperlipidemia Sister   . Diabetes Sister   . Asthma Brother   . Alcohol abuse Brother   . Asthma Son   . Cancer Maternal Grandmother        lung, throat, breast  . Alzheimer's disease Maternal Grandmother   . Hypertension Maternal Grandmother   . Hypercholesterolemia Maternal Grandmother   . Heart disease Maternal Grandfather   . Stroke Maternal Grandfather   . Clotting disorder Maternal Grandfather        blood clots in legs  . Hypertension Sister   . Hyperlipidemia Sister   . Stroke Maternal Aunt   . Clotting disorder Maternal Aunt        blood clots in legs  . Hypertension Maternal Aunt   .  Hypercholesterolemia Maternal Aunt   . Hypertension Maternal Aunt   . Asthma Maternal Aunt   . Clotting disorder Maternal Uncle        blood clot -legs traveled to heart and he died of heart attack  . Colon cancer Neg Hx     CURRENT MEDICATIONS:  Outpatient Encounter Medications as of 01/30/2018  Medication Sig  . albuterol (PROVENTIL HFA;VENTOLIN HFA) 108 (90 Base) MCG/ACT inhaler Inhale 2 puffs into the lungs every 6 (six) hours as needed for wheezing or shortness of breath.  [name] albuterol (PROVENTIL) (2.5 MG/3ML) 0.083% nebulizer solution Take 3 mLs (2.5 mg total) by nebulization every 6 (six) hours as needed for wheezing.  [name] apixaban (ELIQUIS) 5 MG TABS tablet Take 2 tablets (10 mg total) by mouth 2 (two) times daily. On 08/12/17 @ 9PM, start 1 tab (5 mg) two times daily. (Patient taking differently: Take 5 mg by mouth 2 (two) times daily. On 08/12/17 @ 9PM, start 1 tab (5 mg) two times daily.)  . atorvastatin (LIPITOR) 40 MG tablet Take 40 mg by mouth daily.  . cycloSPORINE (RESTASIS) 0.05 % ophthalmic emulsion Place 1 drop into both eyes daily.  [name] esomeprazole (NEXIUM) 40 MG capsule Take 1 capsule (40 mg total) by mouth 2 (two) times daily before a meal.  . furosemide (LASIX) 40 MG tablet Take 1 tablet (40 mg total) by mouth daily.  [name] gabapentin (NEURONTIN) 300 MG capsule Take 300 mg by mouth 3 (three) times daily.  . hydrocortisone 1 % ointment Apply 1 application topically 2 (two) times daily.  . insulin NPH-regular Human (NOVOLIN 70/30 RELION) (70-30) 100 UNIT/ML injection Inject 50 units into skin three times daily (breakfast, supper, bedtime)  . linaclotide (LINZESS) 290 MCG CAPS capsule Take 1 capsule (290 mcg total) by mouth daily before breakfast.  . lisinopril (PRINIVIL,ZESTRIL) 20 MG tablet Take 1 tablet (20 mg total) by mouth daily.  . metFORMIN (GLUCOPHAGE) 1000 MG tablet Take 1 tablet (1,000 mg total) by mouth 2 (two) times daily with a meal.  . montelukast (SINGULAIR) 10 MG  tablet Take 10 mg by mouth at bedtime.   . naproxen (NAPROSYN) 500 MG tablet Take 500 mg by mouth 2 (two) times daily with a meal.   . nystatin (MYCOSTATIN) 100000 UNIT/ML suspension Take 5 mLs (500,000 Units total) by mouth 4 (four) times daily.  . ondansetron (ZOFRAN ODT) 4 MG disintegrating tablet Take 4 mg by mouth every 8 (eight) hours as needed for nausea or vomiting.   . potassium chloride (K-DUR) 10 MEQ tablet Take 10 mEq by mouth every evening.   . sitaGLIPtin (JANUVIA) 100 MG tablet Take 100 mg by mouth daily.  . Vitamin D, Ergocalciferol, (DRISDOL) 50000 units CAPS capsule Take 50,000 Units by mouth every 7 (seven) days.   No facility-administered encounter medications on file as of 01/30/2018.     ALLERGIES:  No Known Allergies   PHYSICAL EXAM:  ECOG Performance status: 1  I have reviewed her vitals. Physical examination today was deferred.   LABORATORY DATA:  I have reviewed the labs as listed.  CBC    Component Value Date/Time   WBC 9.2 08/05/2017 0812   RBC 4.33 08/05/2017 0812   HGB 13.0 08/05/2017 0812   HCT 41.0 08/05/2017 0812   PLT 251 08/05/2017 0812   MCV 94.7 08/05/2017 0812   MCH 30.0 08/05/2017 0812   MCHC 31.7 08/05/2017 0812   RDW 12.6 08/05/2017 0812   LYMPHSABS 2.6 08/03/2017 1930   MONOABS 0.5 08/03/2017 1930   EOSABS 0.2 08/03/2017 1930   BASOSABS 0.0 08/03/2017 1930   CMP Latest Ref Rng & Units 01/28/2018 08/06/2017 08/05/2017  Glucose 65 - 99 mg/dL - 272(H) 236(H)  BUN 6 - 20 mg/dL - 8 9  Creatinine 0.44 - 1.00 mg/dL 0.80 0.77 0.67  Sodium 135 - 145 mmol/L - 137  139  Potassium 3.5 - 5.1 mmol/L - 3.7 3.7  Chloride 101 - 111 mmol/L - 102 106  CO2 22 - 32 mmol/L - 28 26  Calcium 8.9 - 10.3 mg/dL - 9.6 9.7  Total Protein 6.5 - 8.1 g/dL - - -  Total Bilirubin 0.3 - 1.2 mg/dL - - -  Alkaline Phos 38 - 126 U/L - - -  AST 15 - 41 U/L - - -  ALT 14 - 54 U/L - - -      ASSESSMENT & PLAN:   Pulmonary embolism (HCC) 1.  Unprovoked  pulmonary embolism: - Diagnosed in November 2018, on Eliquis 5 mg twice daily, without any bleeding complications. -She did not have any risk factors for pulmonary embolism. - Her blood work was negative for lupus anticoagulant, anticardiolipin antibodies, anti-beta-2 glycoprotein 1 antibodies.  It was also negative for other tests like Antithrombin III, factor V [place]iden, [place] 20210A mutation, protein C and protein S deficiencies. -Because of her history of endometrial cancer, I have done a CT scan of the chest, abdomen and pelvis.  This was within normal limits with no evidence of recurrence.  As she had unprovoked pulmonary embolism, her chance of recurrent DVT is about 8% at year 1 and about 40% at year 5.  At this time the benefits of continued anticoagulation outweigh the risks.  Hence I have recommended indefinite anticoagulation.  Patient is agreeable to this option.  We will see her back on a yearly basis to evaluate risk benefit ratio.  2.  She complains of occasional blood in the urine.  We have sent a UA today.  If it is positive we will make a referral to urology.      Orders placed this encounter:  Orders Placed This Encounter  Procedures  . Urine culture  . Urinalysis, Routine w reflex microscopic  . D-dimer, quantitative      Derek Jack, MD [place] [phone]

## 2018-01-30 NOTE — Assessment & Plan Note (Signed)
1.  Unprovoked pulmonary embolism: - Diagnosed in November 2018, on Eliquis 5 mg twice daily, without any bleeding complications. -She did not have any risk factors for pulmonary embolism. - Her blood work was negative for lupus anticoagulant, anticardiolipin antibodies, anti-beta-2 glycoprotein 1 antibodies.  It was also negative for other tests like Antithrombin III, factor V [place]iden, [place] 20210A mutation, protein C and protein S deficiencies. -Because of her history of endometrial cancer, I have done a CT scan of the chest, abdomen and pelvis.  This was within normal limits with no evidence of recurrence.  As she had unprovoked pulmonary embolism, her chance of recurrent DVT is about 8% at year 1 and about 40% at year 5.  At this time the benefits of continued anticoagulation outweigh the risks.  Hence I have recommended indefinite anticoagulation.  Patient is agreeable to this option.  We will see her back on a yearly basis to evaluate risk benefit ratio.  2.  She complains of occasional blood in the urine.  We have sent a UA today.  If it is positive we will make a referral to urology.

## 2018-02-01 LAB — URINE CULTURE

## 2018-02-05 ENCOUNTER — Encounter: Payer: Self-pay | Admitting: Orthopaedic Surgery

## 2018-02-05 ENCOUNTER — Ambulatory Visit: Payer: Medicaid Other | Admitting: Orthopaedic Surgery

## 2018-02-05 VITALS — BP 148/84 | HR 78 | Ht 66.0 in | Wt 281.0 lb

## 2018-02-05 DIAGNOSIS — G8929 Other chronic pain: Secondary | ICD-10-CM | POA: Diagnosis not present

## 2018-02-05 DIAGNOSIS — M25562 Pain in left knee: Secondary | ICD-10-CM | POA: Diagnosis not present

## 2018-02-05 DIAGNOSIS — F17210 Nicotine dependence, cigarettes, uncomplicated: Secondary | ICD-10-CM

## 2018-02-05 NOTE — Patient Instructions (Signed)
Steps to Quit Smoking Smoking tobacco can be bad for your health. It can also affect almost every organ in your body. Smoking puts you and people around you at risk for many serious Amdrew Oboyle-lasting (chronic) diseases. Quitting smoking is hard, but it is one of the best things that you can do for your health. It is never too late to quit. What are the benefits of quitting smoking? When you quit smoking, you lower your risk for getting serious diseases and conditions. They can include:  Lung cancer or lung disease.  Heart disease.  Stroke.  Heart attack.  Not being able to have children (infertility).  Weak bones (osteoporosis) and broken bones (fractures).  If you have coughing, wheezing, and shortness of breath, those symptoms may get better when you quit. You may also get sick less often. If you are pregnant, quitting smoking can help to lower your chances of having a baby of low birth weight. What can I do to help me quit smoking? Talk with your doctor about what can help you quit smoking. Some things you can do (strategies) include:  Quitting smoking totally, instead of slowly cutting back how much you smoke over a period of time.  Going to in-person counseling. You are more likely to quit if you go to many counseling sessions.  Using resources and support systems, such as: ? Online chats with a counselor. ? Phone quitlines. ? Printed self-help materials. ? Support groups or group counseling. ? Text messaging programs. ? Mobile phone apps or applications.  Taking medicines. Some of these medicines may have nicotine in them. If you are pregnant or breastfeeding, do not take any medicines to quit smoking unless your doctor says it is okay. Talk with your doctor about counseling or other things that can help you.  Talk with your doctor about using more than one strategy at the same time, such as taking medicines while you are also going to in-person counseling. This can help make  quitting easier. What things can I do to make it easier to quit? Quitting smoking might feel very hard at first, but there is a lot that you can do to make it easier. Take these steps:  Talk to your family and friends. Ask them to support and encourage you.  Call phone quitlines, reach out to support groups, or work with a counselor.  Ask people who smoke to not smoke around you.  Avoid places that make you want (trigger) to smoke, such as: ? Bars. ? Parties. ? Smoke-break areas at work.  Spend time with people who do not smoke.  Lower the stress in your life. Stress can make you want to smoke. Try these things to help your stress: ? Getting regular exercise. ? Deep-breathing exercises. ? Yoga. ? Meditating. ? Doing a body scan. To do this, close your eyes, focus on one area of your body at a time from head to toe, and notice which parts of your body are tense. Try to relax the muscles in those areas.  Download or buy apps on your mobile phone or tablet that can help you stick to your quit plan. There are many free apps, such as QuitGuide from the CDC (Centers for Disease Control and Prevention). You can find more support from smokefree.gov and other websites.  This information is not intended to replace advice given to you by your health care provider. Make sure you discuss any questions you have with your health care provider. Document Released: 06/24/2009 Document   Revised: 04/25/2016 Document Reviewed: 01/12/2015 Elsevier Interactive Patient Education  2018 Elsevier Inc.  

## 2018-02-05 NOTE — Progress Notes (Signed)
CC:  I have pain of my left knee. I would like an injection.  The patient has chronic pain of the left knee.  There is no recent trauma.  There is no redness.  Injections in the past have helped.  The knee has no redness, has an effusion and crepitus present.  ROM of the left knee is 0-110.  Impression:  Chronic knee pain left  Return: 3 months  PROCEDURE NOTE:  The patient requests injections of the left knee, verbal consent was obtained.  The left knee was prepped appropriately after time out was performed.   Sterile technique was observed and injection of 1 cc of Depo-Medrol 40 mg with several cc's of plain xylocaine. Anesthesia was provided by ethyl chloride and a 20-gauge needle was used to inject the knee area. The injection was tolerated well.  A band aid dressing was applied.  The patient was advised to apply ice later today and tomorrow to the injection sight as needed.  Electronically Signed Sanjuana Kava, MD 5/28/20191:45 PM

## 2018-03-21 ENCOUNTER — Telehealth: Payer: Self-pay | Admitting: Neurology

## 2018-03-21 ENCOUNTER — Other Ambulatory Visit: Payer: Self-pay | Admitting: Neurology

## 2018-03-21 DIAGNOSIS — G4733 Obstructive sleep apnea (adult) (pediatric): Secondary | ICD-10-CM

## 2018-03-21 NOTE — Telephone Encounter (Signed)
Susie with [facility] called requesting the prescription for the full face mask, last office visit notes, and the sleep study from 2017 faxed to [phone]

## 2018-03-21 NOTE — Telephone Encounter (Signed)
I Received a call from [place] from [employer] (?) [place] on behalf of the patient needing information to be sent to them to get the patient set up with CPAP. They didn't leave a fax number for me to forward this information to. I have called Susie [phone] ext [phone] and LVM for her to call back with fax number.   Also wanted to clarify what is needed  Pt's cpap script was ordered 08/09/2016 and sleep study was done in that time frame. Is this acceptable? I will send office notes from 12/12/17 and prior to sleep study as well.

## 2018-04-05 NOTE — Telephone Encounter (Signed)
Information resent today for the pt as they called asking for this information again.

## 2018-04-25 ENCOUNTER — Encounter: Payer: Self-pay | Admitting: Obstetrics and Gynecology

## 2018-04-25 ENCOUNTER — Ambulatory Visit: Payer: Medicaid Other | Admitting: Obstetrics and Gynecology

## 2018-04-25 VITALS — BP 147/92 | HR 70 | Ht 66.0 in | Wt 270.0 lb

## 2018-04-25 DIAGNOSIS — B372 Candidiasis of skin and nail: Secondary | ICD-10-CM | POA: Diagnosis not present

## 2018-04-25 MED ORDER — FLUCONAZOLE 150 MG PO TABS: 150.0000 mg | ORAL_TABLET | Freq: Once | ORAL | 3 refills | Status: AC

## 2018-04-25 MED ORDER — NYSTATIN-TRIAMCINOLONE 100000-0.1 UNIT/GM-% EX OINT: 1.0000 "application " | TOPICAL_OINTMENT | Freq: Two times a day (BID) | CUTANEOUS | 99 refills | Status: DC

## 2018-04-25 NOTE — Progress Notes (Signed)
Patient ID: Sonya James, female   DOB: 01-13-1966, 52 y.o.   MRN: [id number]    [facility] Visit  @DATE @            Patient name: Sonya James MRN [id number]  Date of birth: 1965-09-19  CC & HPI:  Sonya James is a 52 y.o. female presenting today for a F/U regarding her weight. She has lost 15 lbs since her last visit 3 months ago (01/23/2018). Today, she weighs 270 lbs. She has cut out soda and bread and has decreased her portion sizes. She also walks to stay active. She recently fell so she had to stop walking for a few days.  Pt also reports vaginal itching. She has diabetes and a lot of moisture in the area. She denies an odor. She also has bladder leakage that contributes the the moisture. The patient denies fever, chills or any other symptoms or complaints at this time.   ROS:  ROS + vaginal itching + weight loss + bladder leakage - fever - chills All systems are negative except as noted in the HPI and PMH.   Pertinent History Reviewed:   Reviewed: Significant for endometrial cancer, uterine cancer Medical         Past Medical History:  Diagnosis Date  . Anemia   . Anxiety   . Arthritis   . Asthma   . Autonomic neuropathy   . Constipation   . COPD (chronic obstructive pulmonary disease) ([place])   . CTS (carpal tunnel syndrome)   . Depression   . Diabetes mellitus without complication ([place])   . Endometrial cancer ([place]) 2017  . GERD (gastroesophageal reflux disease)   . Headache(784.0)   . HTN (hypertension)   . Hypercholesterolemia   . Hypothyroidism   . IBS (irritable bowel syndrome)   . Osteoarthritis    osteoarthritis  . Pancreatitis    per patient   . Recurrent boils    buttocks and low back.  . Shortness of breath   . Sleep apnea    CPAP machine  . Uterine cancer ([place]) 2017  . Vitamin D deficiency                               Surgical Hx:    Past Surgical History:  Procedure Laterality Date  . ABDOMINAL HYSTERECTOMY  2018   endometrial  cancer, surgery done at [facility]   . BIOPSY  01/02/2017   Procedure: BIOPSY;  Surgeon: Danie Binder, MD;  [facility];  Service: Endoscopy;;  gastric biopsy  . CESAREAN SECTION  1989  . COLONOSCOPY WITH PROPOFOL N/A 01/02/2017   Dr. Oneida Alar: redundant colon, two 2-3 mm polyps in sigmoid colon (hyperplastic)  . ESOPHAGOGASTRODUODENOSCOPY  05/22/2011   Dr. Oneida Alar: H.pylori gastritis   . ESOPHAGOGASTRODUODENOSCOPY (EGD) WITH PROPOFOL N/A 01/02/2017   Dr. 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 FOOT SURGERY Left    tendon repair  . OOPHORECTOMY    . POLYPECTOMY  01/02/2017   Procedure: POLYPECTOMY;  Surgeon: Danie Binder, MD;  [facility];  Service: Endoscopy;;  sigmoid colon polyps times 2  . REDUCTION MAMMAPLASTY  1996  . SAVORY DILATION N/A 01/02/2017   Procedure: SAVORY DILATION;  Surgeon: Danie Binder, MD;  [facility];  Service: Endoscopy;  Laterality: N/A;  . TUBAL LIGATION  Medications: Reviewed & Updated - see associated section                       Current Outpatient Medications:  .  albuterol (PROVENTIL HFA;VENTOLIN HFA) 108 (90 Base) MCG/ACT inhaler, Inhale 2 puffs into the lungs every 6 (six) hours as needed for wheezing or shortness of breath., Disp: , Rfl:  .  albuterol (PROVENTIL) (2.5 MG/3ML) 0.083% nebulizer solution, Take 3 mLs (2.5 mg total) by nebulization every 6 (six) hours as needed for wheezing., Disp: 75 mL, Rfl: 12 .  apixaban (ELIQUIS) 5 MG TABS tablet, Take 2 tablets (10 mg total) by mouth 2 (two) times daily. On 08/12/17 @ 9PM, start 1 tab (5 mg) two times daily. (Patient taking differently: Take 5 mg by mouth 2 (two) times daily. On 08/12/17 @ 9PM, start 1 tab (5 mg) two times daily.), Disp: 72 tablet, Rfl: 0 .  atorvastatin (LIPITOR) 40 MG tablet, Take 40 mg by mouth daily., Disp: , Rfl:  .  cycloSPORINE (RESTASIS) 0.05 % ophthalmic emulsion, Place 1 drop into  both eyes daily., Disp: , Rfl:  .  esomeprazole (NEXIUM) 40 MG capsule, Take 1 capsule (40 mg total) by mouth 2 (two) times daily before a meal., Disp: 60 capsule, Rfl: 11 .  furosemide (LASIX) 40 MG tablet, Take 1 tablet (40 mg total) by mouth daily., Disp: 30 tablet, Rfl: 0 .  gabapentin (NEURONTIN) 300 MG capsule, Take 300 mg by mouth 3 (three) times daily., Disp: , Rfl:  .  hydrocortisone 1 % ointment, Apply 1 application topically 2 (two) times daily., Disp: 30 g, Rfl: prn .  insulin NPH-regular Human (NOVOLIN 70/30 RELION) (70-30) 100 UNIT/ML injection, Inject 50 units into skin three times daily (breakfast, supper, bedtime), Disp: 10 mL, Rfl: 1 .  linaclotide (LINZESS) 290 MCG CAPS capsule, Take 1 capsule (290 mcg total) by mouth daily before breakfast., Disp: 90 capsule, Rfl: 3 .  lisinopril (PRINIVIL,ZESTRIL) 20 MG tablet, Take 1 tablet (20 mg total) by mouth daily., Disp: 30 tablet, Rfl: 0 .  metFORMIN (GLUCOPHAGE) 1000 MG tablet, Take 1 tablet (1,000 mg total) by mouth 2 (two) times daily with a meal., Disp: 60 tablet, Rfl: 0 .  montelukast (SINGULAIR) 10 MG tablet, Take 10 mg by mouth at bedtime. , Disp: , Rfl:  .  naproxen (NAPROSYN) 500 MG tablet, Take 500 mg by mouth 2 (two) times daily with a meal. , Disp: , Rfl: 5 .  nystatin (MYCOSTATIN) 100000 UNIT/ML suspension, Take 5 mLs (500,000 Units total) by mouth 4 (four) times daily., Disp: 473 mL, Rfl: 0 .  ondansetron (ZOFRAN ODT) 4 MG disintegrating tablet, Take 4 mg by mouth every 8 (eight) hours as needed for nausea or vomiting. , Disp: , Rfl:  .  potassium chloride (K-DUR) 10 MEQ tablet, Take 10 mEq by mouth every evening. , Disp: , Rfl:  .  sitaGLIPtin (JANUVIA) 100 MG tablet, Take 100 mg by mouth daily., Disp: , Rfl:  .  Vitamin D, Ergocalciferol, (DRISDOL) 50000 units CAPS capsule, Take 50,000 Units by mouth every 7 (seven) days., Disp: , Rfl:    Social History: Reviewed -  reports that she has been smoking cigarettes. She has a  11.00 pack-year smoking history. She has never used smokeless tobacco.  Objective Findings:  Vitals: Blood pressure (!) 147/92, pulse 70, height 5\' 6"  (1.676 m), weight 270 lb (122.5 kg), last menstrual period 09/20/2015.  PHYSICAL EXAMINATION General appearance - alert, well appearing,  and in no distress, oriented to person, place, and time and overweight Mental status - alert, oriented to person, place, and time, normal mood, behavior, speech, dress, motor activity, and thought processes, affect appropriate to mood  PELVIC External genitalia: Excoriated. Chronic moisture. Injuries due to scratching. Cervix - surgically absent Uterus - surgically absent  Assessment & Plan:   A:  1. Hx of endometrial cancer 2. Obesity Body mass index is 43.58 kg/m. s/p 5%weight loss 3. Chronic Vulvar moisture and inflammation -- likely yeast  P:  1. Rx Mycolog topical BID 2. Rx Diflucan tablets 3. Consider using Aloe with numbing cream prn  By signing my name below, I, De Burrs, attest that this documentation has been prepared under the direction and in the presence of Jonnie Kind, MD. Electronically Signed: De Burrs, Medical Scribe. 04/25/18. 10:38 AM.  I personally performed the services described in this documentation, which was SCRIBED in my presence. The recorded information has been reviewed and considered accurate. It has been edited as necessary during review. Jonnie Kind, MD

## 2018-05-08 ENCOUNTER — Ambulatory Visit: Payer: Medicaid Other | Admitting: Orthopaedic Surgery

## 2018-05-08 ENCOUNTER — Ambulatory Visit: Payer: Medicaid Other | Admitting: Gastroenterology

## 2018-05-08 ENCOUNTER — Encounter: Payer: Self-pay | Admitting: Gastroenterology

## 2018-05-08 VITALS — BP 152/90 | HR 67 | Temp 96.8°F | Ht 65.0 in | Wt 270.0 lb

## 2018-05-08 DIAGNOSIS — K219 Gastro-esophageal reflux disease without esophagitis: Secondary | ICD-10-CM | POA: Diagnosis not present

## 2018-05-08 DIAGNOSIS — K5900 Constipation, unspecified: Secondary | ICD-10-CM | POA: Diagnosis not present

## 2018-05-08 NOTE — Assessment & Plan Note (Signed)
Continue Linzess 290 mcg once daily. Return in 1 year.

## 2018-05-08 NOTE — Assessment & Plan Note (Signed)
Continue Nexium BID. Return in 1 year. No alarm features.

## 2018-05-08 NOTE — Patient Instructions (Signed)
Continue Nexium twice a day, 30 minutes before breakfast and dinner.  Continue Linzess one capsule each morning.  It is always great to see you!  It was a pleasure to see you today. I strive to create trusting relationships with patients to provide genuine, compassionate, and quality care. I value your feedback. If you receive a survey regarding your visit,  I greatly appreciate you taking time to fill this out.   Annitta Needs, PhD, ANP-BC [facility]

## 2018-05-08 NOTE — Progress Notes (Signed)
Primary Care Physician:  Previously Dr. Kevan Ny, now in process of obtaining one at same practice location Primary GI: Dr. Oneida Alar   Chief Complaint  Patient presents with  . Gastroesophageal Reflux    f/u, doing ok    HPI:   Sonya James is a 52 y.o. female presenting today with a history of constipation, dysphagia, GERD. 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 Colonoscopy with hyperplastic polyps. Linzess 290 mcg has done well for constipation,. Nexium BID for GERD. 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 She has seen Hematology for an extensive evaluation. CT chest/abd/pelvis was done without obvious malignancy. She does have a history of endometrial cancer in past.   Occasional burping with soda. Tossed salad will sometimes cause worsening reflux. Constipation still doing well with Linzess. Overall no GI issues.     Past Medical History:  Diagnosis Date  . Anemia   . Anxiety   . Arthritis   . Asthma   . Autonomic neuropathy   . Constipation   . COPD (chronic obstructive pulmonary disease) ([place])   . CTS (carpal tunnel syndrome)   . Depression   . Diabetes mellitus without complication (Sonya James)   . Endometrial cancer (Sonya James) 2017  . GERD (gastroesophageal reflux disease)   . Headache(784.0)   . HTN (hypertension)   . Hypercholesterolemia   . Hypothyroidism   . IBS (irritable bowel syndrome)   . Osteoarthritis    osteoarthritis  . Pancreatitis    per patient   . Recurrent boils    buttocks and low back.  . Shortness of breath   . Sleep apnea    CPAP machine  . Uterine cancer (Sonya James) 2017  . Vitamin D deficiency     Past Surgical History:  Procedure Laterality Date  . ABDOMINAL HYSTERECTOMY  2018   endometrial cancer, [facility] done at [facility]   . BIOPSY  01/02/2017   Procedure: BIOPSY;  Surgeon: Sonya Binder, MD;  [facility];  Service: Endoscopy;;  gastric  biopsy  . CESAREAN SECTION  1989  . COLONOSCOPY WITH PROPOFOL N/A 01/02/2017   Dr. Oneida Alar: redundant colon, two 2-3 mm polyps in sigmoid colon (hyperplastic)  . ESOPHAGOGASTRODUODENOSCOPY  05/22/2011   Dr. Oneida Alar: H.pylori gastritis   . ESOPHAGOGASTRODUODENOSCOPY (EGD) WITH PROPOFOL N/A 01/02/2017   Dr. 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 FOOT SURGERY Left    tendon repair  . OOPHORECTOMY    . POLYPECTOMY  01/02/2017   Procedure: POLYPECTOMY;  Surgeon: Sonya Binder, MD;  [facility];  Service: Endoscopy;;  sigmoid colon polyps times 2  . REDUCTION MAMMAPLASTY  1996  . SAVORY DILATION N/A 01/02/2017   Procedure: SAVORY DILATION;  Surgeon: Sonya Binder, MD;  [facility];  Service: Endoscopy;  Laterality: N/A;  . TUBAL LIGATION      Current Outpatient Medications  Medication Sig Dispense Refill  . albuterol (PROVENTIL HFA;VENTOLIN HFA) 108 (90 Base) MCG/ACT inhaler Inhale 2 puffs into the lungs every 6 (six) hours as needed for wheezing or shortness of breath.    [name] albuterol (PROVENTIL) (2.5 MG/3ML) 0.083% nebulizer solution Take 3 mLs (2.5 mg total) by nebulization every 6 (six) hours as needed for wheezing. 75 mL 12  . apixaban (ELIQUIS) 5 MG TABS tablet Take 2 tablets (10 mg total) by mouth 2 (two) times daily. On 08/12/17 @  9PM, start 1 tab (5 mg) two times daily. (Patient taking differently: Take 5 mg by mouth 2 (two) times daily. On 08/12/17 @ 9PM, start 1 tab (5 mg) two times daily.) 72 tablet 0  . atorvastatin (LIPITOR) 40 MG tablet Take 40 mg by mouth daily.    . cycloSPORINE (RESTASIS) 0.05 % ophthalmic emulsion Place 1 drop into both eyes daily.    [name] esomeprazole (NEXIUM) 40 MG capsule Take 1 capsule (40 mg total) by mouth 2 (two) times daily before a meal. 60 capsule 11  . furosemide (LASIX) 40 MG tablet Take 1 tablet (40 mg total) by mouth daily. 30 tablet 0  . gabapentin (NEURONTIN) 300 MG  capsule Take 300 mg by mouth 3 (three) times daily.    . hydrocortisone 1 % ointment Apply 1 application topically 2 (two) times daily. 30 g prn  . insulin NPH-regular Human (NOVOLIN 70/30 RELION) (70-30) 100 UNIT/ML injection Inject 50 units into skin three times daily (breakfast, supper, bedtime) 10 mL 1  . linaclotide (LINZESS) 290 MCG CAPS capsule Take 1 capsule (290 mcg total) by mouth daily before breakfast. 90 capsule 3  . lisinopril (PRINIVIL,ZESTRIL) 20 MG tablet Take 1 tablet (20 mg total) by mouth daily. 30 tablet 0  . metFORMIN (GLUCOPHAGE) 1000 MG tablet Take 1 tablet (1,000 mg total) by mouth 2 (two) times daily with a meal. 60 tablet 0  . montelukast (SINGULAIR) 10 MG tablet Take 10 mg by mouth at bedtime.     . naproxen (NAPROSYN) 500 MG tablet Take 500 mg by mouth 2 (two) times daily with a meal.   5  . nystatin (MYCOSTATIN) 100000 UNIT/ML suspension Take 5 mLs (500,000 Units total) by mouth 4 (four) times daily. 473 mL 0  . nystatin-triamcinolone ointment (MYCOLOG) Apply 1 application topically 2 (two) times daily. To affected area.,use after drying tissue. 30 g prn  . ondansetron (ZOFRAN ODT) 4 MG disintegrating tablet Take 4 mg by mouth every 8 (eight) hours as needed for nausea or vomiting.     . potassium chloride (K-DUR) 10 MEQ tablet Take 10 mEq by mouth every evening.     . sitaGLIPtin (JANUVIA) 100 MG tablet Take 100 mg by mouth daily.    . Vitamin D, Ergocalciferol, (DRISDOL) 50000 units CAPS capsule Take 50,000 Units by mouth every 7 (seven) days.     No current facility-administered medications for this visit.     Allergies as of 05/08/2018  . (No Known Allergies)    Family History  Problem Relation Age of Onset  . Diabetes Mother   . Hypertension Mother   . COPD Mother   . Asthma Mother   . Hypercholesterolemia Mother   . Hypertension Sister   . Hyperlipidemia Sister   . Diabetes Sister   . Asthma Brother   . Alcohol abuse Brother   . Asthma Son   .  Cancer Maternal Grandmother        lung, throat, breast  . Alzheimer's disease Maternal Grandmother   . Hypertension Maternal Grandmother   . Hypercholesterolemia Maternal Grandmother   . Heart disease Maternal Grandfather   . Stroke Maternal Grandfather   . Clotting disorder Maternal Grandfather        blood clots in legs  . Hypertension Sister   . Hyperlipidemia Sister   . Stroke Maternal Aunt   . Clotting disorder Maternal Aunt        blood clots in legs  . Hypertension Maternal Aunt   . Hypercholesterolemia  Maternal Aunt   . Hypertension Maternal Aunt   . Asthma Maternal Aunt   . Clotting disorder Maternal Uncle        blood clot -legs traveled to heart and he died of heart attack  . Colon cancer Neg Hx     Social History   Socioeconomic History  . Marital status: Married    Spouse name: Not on file  . Number of children: 1  . Years of education: Not on file  . Highest education level: Not on file  Occupational History  . Not on file  Social Needs  . Financial resource strain: Not on file  . Food insecurity:    Worry: Not on file    Inability: Not on file  . Transportation needs:    Medical: Not on file    Non-medical: Not on file  Tobacco Use  . Smoking status: Current Some Day Smoker    Packs/day: 0.50    Years: 22.00    Pack years: 11.00    Types: Cigarettes  . Smokeless tobacco: Never Used  Substance and Sexual Activity  . Alcohol use: No  . Drug use: No  . Sexual activity: Yes    Birth control/protection: Surgical    Comment: hyst  Lifestyle  . Physical activity:    Days per week: Not on file    Minutes per session: Not on file  . Stress: Not on file  Relationships  . Social connections:    Talks on phone: Not on file    Gets together: Not on file    Attends religious service: Not on file    Active member of club or organization: Not on file    Attends meetings of clubs or organizations: Not on file    Relationship status: Not on file  Other  Topics Concern  . Not on file  Social History Narrative   Drinks coffee occasionally, soda 2-3 daily     Review of Systems: Gen: Denies fever, chills, anorexia. Denies fatigue, weakness, weight loss.  CV: Denies chest pain, palpitations, syncope, peripheral edema, and claudication. Resp: Denies dyspnea at rest, cough, wheezing, coughing up blood, and pleurisy. GI: see HPI  Derm: Denies rash, itching, dry skin Psych: Denies depression, anxiety, memory loss, confusion. No homicidal or suicidal ideation.  Heme: Denies bruising, bleeding, and enlarged lymph nodes.  Physical Exam: BP (!) 152/90   Pulse 67   Temp (!) 96.8 F (36 C) (Oral)   Ht 5\' 5"  (1.651 m)   Wt 270 lb (122.5 kg)   LMP 09/20/2015 (Exact Date)   BMI 44.93 kg/m  General:   Alert and oriented. No distress noted. Pleasant and cooperative.  Head:  Normocephalic and atraumatic. Eyes:  Conjuctiva clear without scleral icterus. Mouth:  Oral mucosa pink and moist.  Abdomen:  +BS, soft, non-tender and non-distended. No rebound or guarding. No HSM or masses noted. Msk:  Symmetrical without gross deformities. Normal posture. Extremities:  Without edema. Neurologic:  Alert and  oriented x4 Psych:  Alert and cooperative. Normal mood and affect.

## 2018-05-08 NOTE — Progress Notes (Signed)
CC'ED TO PCP 

## 2018-05-09 ENCOUNTER — Ambulatory Visit: Payer: Medicaid Other | Admitting: Orthopaedic Surgery

## 2018-05-09 ENCOUNTER — Encounter: Payer: Self-pay | Admitting: Orthopaedic Surgery

## 2018-05-09 VITALS — BP 122/75 | HR 71 | Ht 65.0 in | Wt 270.0 lb

## 2018-05-09 DIAGNOSIS — F17210 Nicotine dependence, cigarettes, uncomplicated: Secondary | ICD-10-CM

## 2018-05-09 DIAGNOSIS — G8929 Other chronic pain: Secondary | ICD-10-CM

## 2018-05-09 DIAGNOSIS — M25562 Pain in left knee: Secondary | ICD-10-CM

## 2018-05-09 NOTE — Progress Notes (Signed)
CC:  I have pain of my left knee. I would like an injection.  The patient has chronic pain of the left knee.  There is no recent trauma.  There is no redness.  Injections in the past have helped.  The knee has no redness, has an effusion and crepitus present.  ROM of the left knee is 0-105.  Impression:  Chronic knee pain left  Return: 3 months  PROCEDURE NOTE:  The patient requests injections of the left knee, verbal consent was obtained.  The left knee was prepped appropriately after time out was performed.   Sterile technique was observed and injection of 1 cc of Depo-Medrol 40 mg with several cc's of plain xylocaine. Anesthesia was provided by ethyl chloride and a 20-gauge needle was used to inject the knee area. The injection was tolerated well.  A band aid dressing was applied.  The patient was advised to apply ice later today and tomorrow to the injection sight as needed.  Electronically Signed Sanjuana Kava, MD 8/29/20199:11 AM

## 2018-06-24 ENCOUNTER — Ambulatory Visit: Payer: Medicaid Other | Admitting: Obstetrics and Gynecology

## 2018-07-02 ENCOUNTER — Telehealth: Payer: Self-pay | Admitting: Obstetrics and Gynecology

## 2018-07-02 ENCOUNTER — Ambulatory Visit: Payer: Medicaid Other | Admitting: Obstetrics and Gynecology

## 2018-07-02 NOTE — Telephone Encounter (Signed)
Pt said she needs to speak to Dr Glo Herring, he was in the room with a patient and I advised her I would have someone give her a call. She had appt today but canceled due to the rcats not coming to pick her up

## 2018-07-02 NOTE — Telephone Encounter (Signed)
Spoke with pt. Pt has a boil that has popped. 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 Pt is using Neosporin. Had an appt today but was unable to make it. Appt scheduled for tomorrow at 1:45 pm. Pt voiced understanding. [place]

## 2018-07-03 ENCOUNTER — Encounter: Payer: Self-pay | Admitting: Obstetrics and Gynecology

## 2018-07-03 ENCOUNTER — Ambulatory Visit: Payer: Medicaid Other | Admitting: Obstetrics and Gynecology

## 2018-07-03 VITALS — BP 142/80 | HR 73 | Wt 268.8 lb

## 2018-07-03 DIAGNOSIS — L739 Follicular disorder, unspecified: Secondary | ICD-10-CM | POA: Diagnosis not present

## 2018-07-03 MED ORDER — DOXYCYCLINE HYCLATE 100 MG PO CAPS: 100.0000 mg | ORAL_CAPSULE | Freq: Two times a day (BID) | ORAL | 1 refills | Status: DC

## 2018-07-03 MED ORDER — SILVER SULFADIAZINE 1 % EX CREA: 1.0000 "application " | TOPICAL_CREAM | Freq: Every day | CUTANEOUS | 0 refills | Status: DC

## 2018-07-03 NOTE — Progress Notes (Signed)
Patient ID: Sonya James, female   DOB: 10/29/1965, 52 y.o.   MRN: [id number]    [facility] Visit  @DATE @            Patient name: Sonya James MRN [id number]  Date of birth: 1965/10/16  CC & HPI:  Sonya James is a 52 y.o. female presenting today for skin boil that burst this past Monday and is tender the touch. She says her "fat rolls giving her problems"  No fever, chills.  ROS:  ROS +skin infection -fever -chills All systems are negative except as noted in the HPI and PMH.   Pertinent History Reviewed:   Reviewed: Medical         Past Medical History:  Diagnosis Date  . Anemia   . Anxiety   . Arthritis   . Asthma   . Autonomic neuropathy   . Constipation   . COPD (chronic obstructive pulmonary disease) ([place])   . CTS (carpal tunnel syndrome)   . Depression   . Diabetes mellitus without complication ([place])   . Endometrial cancer ([place]) 2017  . GERD (gastroesophageal reflux disease)   . Headache(784.0)   . HTN (hypertension)   . Hypercholesterolemia   . Hypothyroidism   . IBS (irritable bowel syndrome)   . Osteoarthritis    osteoarthritis  . Pancreatitis    per patient   . Recurrent boils    buttocks and low back.  . Shortness of breath   . Sleep apnea    CPAP machine  . Uterine cancer ([place]) 2017  . Vitamin D deficiency                               Surgical Hx:    Past Surgical History:  Procedure Laterality Date  . ABDOMINAL HYSTERECTOMY  2018   endometrial cancer, surgery done at [facility]   . BIOPSY  01/02/2017   Procedure: BIOPSY;  Surgeon: Danie Binder, MD;  [facility];  Service: Endoscopy;;  gastric biopsy  . CESAREAN SECTION  1989  . COLONOSCOPY WITH PROPOFOL N/A 01/02/2017   Dr. Oneida Alar: redundant colon, two 2-3 mm polyps in sigmoid colon (hyperplastic)  . ESOPHAGOGASTRODUODENOSCOPY  05/22/2011   Dr. Oneida Alar: H.pylori gastritis   . ESOPHAGOGASTRODUODENOSCOPY (EGD) WITH PROPOFOL N/A 01/02/2017   Dr. 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 FOOT SURGERY Left    tendon repair  . OOPHORECTOMY    . POLYPECTOMY  01/02/2017   Procedure: POLYPECTOMY;  Surgeon: Danie Binder, MD;  [facility];  Service: Endoscopy;;  sigmoid colon polyps times 2  . REDUCTION MAMMAPLASTY  1996  . SAVORY DILATION N/A 01/02/2017   Procedure: SAVORY DILATION;  Surgeon: Danie Binder, MD;  [facility];  Service: Endoscopy;  Laterality: N/A;  . TUBAL LIGATION     Medications: Reviewed & Updated - see associated section                       Current Outpatient Medications:  .  apixaban (ELIQUIS) 5 MG TABS tablet, Take 2 tablets (10 mg total) by mouth 2 (two) times daily. On 08/12/17 @ 9PM, start 1 tab (5 mg) two times daily. (Patient taking differently: Take 5 mg by mouth 2 (two) times daily. On 08/12/17 @ 9PM, start 1 tab (5 mg) two times daily.), Disp: 72  tablet, Rfl: 0 .  atorvastatin (LIPITOR) 40 MG tablet, Take 40 mg by mouth daily., Disp: , Rfl:  .  cycloSPORINE (RESTASIS) 0.05 % ophthalmic emulsion, Place 1 drop into both eyes daily., Disp: , Rfl:  .  esomeprazole (NEXIUM) 40 MG capsule, Take 1 capsule (40 mg total) by mouth 2 (two) times daily before a meal., Disp: 60 capsule, Rfl: 11 .  furosemide (LASIX) 40 MG tablet, Take 1 tablet (40 mg total) by mouth daily., Disp: 30 tablet, Rfl: 0 .  gabapentin (NEURONTIN) 300 MG capsule, Take 300 mg by mouth 3 (three) times daily., Disp: , Rfl:  .  hydrocortisone 1 % ointment, Apply 1 application topically 2 (two) times daily., Disp: 30 g, Rfl: prn .  insulin NPH-regular Human (NOVOLIN 70/30 RELION) (70-30) 100 UNIT/ML injection, Inject 50 units into skin three times daily (breakfast, supper, bedtime), Disp: 10 mL, Rfl: 1 .  linaclotide (LINZESS) 290 MCG CAPS capsule, Take 1 capsule (290 mcg total) by mouth daily before breakfast., Disp: 90 capsule, Rfl: 3 .  lisinopril (PRINIVIL,ZESTRIL) 20 MG tablet, Take 1  tablet (20 mg total) by mouth daily., Disp: 30 tablet, Rfl: 0 .  metFORMIN (GLUCOPHAGE) 1000 MG tablet, Take 1 tablet (1,000 mg total) by mouth 2 (two) times daily with a meal., Disp: 60 tablet, Rfl: 0 .  montelukast (SINGULAIR) 10 MG tablet, Take 10 mg by mouth at bedtime. , Disp: , Rfl:  .  naproxen (NAPROSYN) 500 MG tablet, Take 500 mg by mouth 2 (two) times daily with a meal. , Disp: , Rfl: 5 .  nystatin (MYCOSTATIN) 100000 UNIT/ML suspension, Take 5 mLs (500,000 Units total) by mouth 4 (four) times daily., Disp: 473 mL, Rfl: 0 .  nystatin-triamcinolone ointment (MYCOLOG), Apply 1 application topically 2 (two) times daily. To affected area.,use after drying tissue., Disp: 30 g, Rfl: prn .  potassium chloride (K-DUR) 10 MEQ tablet, Take 10 mEq by mouth every evening. , Disp: , Rfl:  .  sitaGLIPtin (JANUVIA) 100 MG tablet, Take 100 mg by mouth daily., Disp: , Rfl:  .  Vitamin D, Ergocalciferol, (DRISDOL) 50000 units CAPS capsule, Take 50,000 Units by mouth every 7 (seven) days., Disp: , Rfl:  .  albuterol (PROVENTIL HFA;VENTOLIN HFA) 108 (90 Base) MCG/ACT inhaler, Inhale 2 puffs into the lungs every 6 (six) hours as needed for wheezing or shortness of breath., Disp: , Rfl:  .  albuterol (PROVENTIL) (2.5 MG/3ML) 0.083% nebulizer solution, Take 3 mLs (2.5 mg total) by nebulization every 6 (six) hours as needed for wheezing. (Patient not taking: Reported on 07/03/2018), Disp: 75 mL, Rfl: 12 .  ondansetron (ZOFRAN ODT) 4 MG disintegrating tablet, Take 4 mg by mouth every 8 (eight) hours as needed for nausea or vomiting. , Disp: , Rfl:    Social History: Reviewed -  reports that she has been smoking cigarettes. She has a 11.00 pack-year smoking history. She has never used smokeless tobacco.  Objective Findings:  Vitals: Blood pressure (!) 142/80, pulse 73, weight 268 lb 12.8 oz (121.9 kg), last menstrual period 09/20/2015.  PHYSICAL EXAMINATION General appearance - alert, well appearing, and in no  distress, oriented to person, place, and time and overweight Mental status - alert, oriented to person, place, and time, normal mood, behavior, speech, dress, motor activity, and thought processes, affect appropriate to mood Skin- 1.5 cm area of draining superficial skin infection  PELVIC NOT DONE   Assessment & Plan:   A:  1.  1.5 cm area superficial skin  infection  P:  1. 2 oz of Silvadene 2 doxycycline 100 bid     By signing my name below, I, Samul Dada, attest that this documentation has been prepared under the direction and in the presence of Jonnie Kind, MD. Electronically Signed: [place]. 07/03/18. 1:58 PM.  I personally performed the services described in this documentation, which was SCRIBED in my presence. The recorded information has been reviewed and considered accurate. It has been edited as necessary during review. Jonnie Kind, MD

## 2018-07-04 ENCOUNTER — Other Ambulatory Visit: Payer: Self-pay | Admitting: Obstetrics and Gynecology

## 2018-07-04 ENCOUNTER — Telehealth: Payer: Self-pay | Admitting: *Deleted

## 2018-07-04 MED ORDER — FLUCONAZOLE 150 MG PO TABS: 150.0000 mg | ORAL_TABLET | ORAL | 1 refills | Status: DC

## 2018-07-04 NOTE — Telephone Encounter (Signed)
Left message advising pt can check with pharmacy within 24 hours. Thanks!!

## 2018-07-04 NOTE — Progress Notes (Signed)
Diflucan for yeast escribed.

## 2018-07-04 NOTE — Telephone Encounter (Signed)
Diflucan e.scribed .

## 2018-07-05 ENCOUNTER — Other Ambulatory Visit (HOSPITAL_COMMUNITY): Payer: Self-pay | Admitting: Internal Medicine

## 2018-07-05 ENCOUNTER — Other Ambulatory Visit (HOSPITAL_COMMUNITY): Payer: Self-pay | Admitting: Family Medicine

## 2018-07-05 DIAGNOSIS — Z1231 Encounter for screening mammogram for malignant neoplasm of breast: Secondary | ICD-10-CM

## 2018-07-17 ENCOUNTER — Ambulatory Visit: Payer: Medicaid Other | Admitting: Obstetrics and Gynecology

## 2018-07-31 ENCOUNTER — Ambulatory Visit (INDEPENDENT_AMBULATORY_CARE_PROVIDER_SITE_OTHER): Payer: Medicaid Other

## 2018-07-31 ENCOUNTER — Encounter: Payer: Self-pay | Admitting: Orthopaedic Surgery

## 2018-07-31 ENCOUNTER — Ambulatory Visit: Payer: Medicaid Other | Admitting: Orthopaedic Surgery

## 2018-07-31 VITALS — BP 152/93 | HR 74 | Ht 65.0 in | Wt 258.0 lb

## 2018-07-31 DIAGNOSIS — M25562 Pain in left knee: Secondary | ICD-10-CM | POA: Diagnosis not present

## 2018-07-31 DIAGNOSIS — Z6841 Body Mass Index (BMI) 40.0 and over, adult: Secondary | ICD-10-CM

## 2018-07-31 DIAGNOSIS — G8929 Other chronic pain: Secondary | ICD-10-CM

## 2018-07-31 DIAGNOSIS — M25512 Pain in left shoulder: Secondary | ICD-10-CM | POA: Diagnosis not present

## 2018-07-31 DIAGNOSIS — F17210 Nicotine dependence, cigarettes, uncomplicated: Secondary | ICD-10-CM | POA: Diagnosis not present

## 2018-07-31 DIAGNOSIS — M25561 Pain in right knee: Secondary | ICD-10-CM

## 2018-07-31 MED ORDER — HYDROCODONE-ACETAMINOPHEN 5-325 MG PO TABS: ORAL_TABLET | ORAL | 0 refills | Status: DC

## 2018-07-31 MED ORDER — PREDNISONE 5 MG (21) PO TBPK: ORAL_TABLET | ORAL | 0 refills | Status: DC

## 2018-07-31 NOTE — Progress Notes (Signed)
Patient Sonya James, female DOB:1966/01/08, 52 y.o. JSH:[id number]  Chief Complaint  Patient presents with  . Knee Pain  . Shoulder Pain    HPI  Sonya James is a 52 y.o. female who has bilateral knee pain and now has developed pain of the left shoulder.  She has some swelling anteriorly. She has no redness.  She has pain with overhead use of the shoulder.  Her knees are tender and pop and have swelling.  She has no trauma.  The left knee is more tender than the right.   Body mass index is 42.93 kg/m.  The patient meets the AMA guidelines for Morbid (severe) obesity with a BMI > 40.0 and I have recommended weight loss.   ROS  Review of Systems  HENT: Negative for congestion.   Respiratory: Positive for cough and shortness of breath.   Cardiovascular: Positive for leg swelling. Negative for chest pain.  Endocrine: Positive for cold intolerance.  Musculoskeletal: Positive for arthralgias, gait problem and joint swelling.  Allergic/Immunologic: Positive for environmental allergies.    All other systems reviewed and are negative.  The following is a summary of the past history medically, past history surgically, known current medicines, social history and family history.  This information is gathered electronically by the computer from prior information and documentation.  I review this each visit and have found including this information at this point in the chart is beneficial and informative.    Past Medical History:  Diagnosis Date  . Anemia   . Anxiety   . Arthritis   . Asthma   . Autonomic neuropathy   . Constipation   . COPD (chronic obstructive pulmonary disease) ([place])   . CTS (carpal tunnel syndrome)   . Depression   . Diabetes mellitus without complication ([place])   . Endometrial cancer ([place]) 2017  . GERD (gastroesophageal reflux disease)   . Headache(784.0)   . HTN (hypertension)   . Hypercholesterolemia   . Hypothyroidism   . IBS (irritable bowel syndrome)    . Osteoarthritis    osteoarthritis  . Pancreatitis    per patient   . Recurrent boils    buttocks and low back.  . Shortness of breath   . Sleep apnea    CPAP machine  . Uterine cancer ([place]) 2017  . Vitamin D deficiency     Past Surgical History:  Procedure Laterality Date  . ABDOMINAL HYSTERECTOMY  2018   endometrial cancer, [facility] done at [facility]   . BIOPSY  01/02/2017   Procedure: BIOPSY;  Surgeon: Danie Binder, MD;  [facility];  Service: Endoscopy;;  gastric biopsy  . CESAREAN SECTION  1989  . COLONOSCOPY WITH PROPOFOL N/A 01/02/2017   Dr. Oneida Alar: redundant colon, two 2-3 mm polyps in sigmoid colon (hyperplastic)  . ESOPHAGOGASTRODUODENOSCOPY  05/22/2011   Dr. Oneida Alar: H.pylori gastritis   . ESOPHAGOGASTRODUODENOSCOPY (EGD) WITH PROPOFOL N/A 01/02/2017   Dr. 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 FOOT SURGERY Left    tendon repair  . OOPHORECTOMY    . POLYPECTOMY  01/02/2017   Procedure: POLYPECTOMY;  Surgeon: Danie Binder, MD;  [facility];  Service: Endoscopy;;  sigmoid colon polyps times 2  . REDUCTION MAMMAPLASTY  1996  . SAVORY DILATION N/A 01/02/2017   Procedure: SAVORY DILATION;  Surgeon: Danie Binder, MD;  [facility];  Service: Endoscopy;  Laterality: N/A;  . TUBAL LIGATION  Family History  Problem Relation Age of Onset  . Diabetes Mother   . Hypertension Mother   . COPD Mother   . Asthma Mother   . Hypercholesterolemia Mother   . Hypertension Sister   . Hyperlipidemia Sister   . Diabetes Sister   . Asthma Brother   . Alcohol abuse Brother   . Asthma Son   . Cancer Maternal Grandmother        lung, throat, breast  . Alzheimer's disease Maternal Grandmother   . Hypertension Maternal Grandmother   . Hypercholesterolemia Maternal Grandmother   . Heart disease Maternal Grandfather   . Stroke Maternal Grandfather   . Clotting disorder Maternal  Grandfather        blood clots in legs  . Hypertension Sister   . Hyperlipidemia Sister   . Stroke Maternal Aunt   . Clotting disorder Maternal Aunt        blood clots in legs  . Hypertension Maternal Aunt   . Hypercholesterolemia Maternal Aunt   . Hypertension Maternal Aunt   . Asthma Maternal Aunt   . Clotting disorder Maternal Uncle        blood clot -legs traveled to heart and he died of heart attack  . Colon cancer Neg Hx     Social History Social History   Tobacco Use  . Smoking status: Current Some Day Smoker    Packs/day: 0.50    Years: 22.00    Pack years: 11.00    Types: Cigarettes  . Smokeless tobacco: Never Used  Substance Use Topics  . Alcohol use: No  . Drug use: No    No Known Allergies  Current Outpatient Medications  Medication Sig Dispense Refill  . albuterol (PROVENTIL HFA;VENTOLIN HFA) 108 (90 Base) MCG/ACT inhaler Inhale 2 puffs into the lungs every 6 (six) hours as needed for wheezing or shortness of breath.    [name] albuterol (PROVENTIL) (2.5 MG/3ML) 0.083% nebulizer solution Take 3 mLs (2.5 mg total) by nebulization every 6 (six) hours as needed for wheezing. (Patient not taking: Reported on 07/03/2018) 75 mL 12  . apixaban (ELIQUIS) 5 MG TABS tablet Take 2 tablets (10 mg total) by mouth 2 (two) times daily. On 08/12/17 @ 9PM, start 1 tab (5 mg) two times daily. (Patient taking differently: Take 5 mg by mouth 2 (two) times daily. On 08/12/17 @ 9PM, start 1 tab (5 mg) two times daily.) 72 tablet 0  . atorvastatin (LIPITOR) 40 MG tablet Take 40 mg by mouth daily.    . cycloSPORINE (RESTASIS) 0.05 % ophthalmic emulsion Place 1 drop into both eyes daily.    [name] doxycycline (VIBRAMYCIN) 100 MG capsule Take 1 capsule (100 mg total) by mouth 2 (two) times daily. 14 capsule 1  . esomeprazole (NEXIUM) 40 MG capsule Take 1 capsule (40 mg total) by mouth 2 (two) times daily before a meal. 60 capsule 11  . fluconazole (DIFLUCAN) 150 MG tablet Take 1 tablet (150 mg  total) by mouth every 3 (three) days. 2 tablet 1  . furosemide (LASIX) 40 MG tablet Take 1 tablet (40 mg total) by mouth daily. 30 tablet 0  . gabapentin (NEURONTIN) 300 MG capsule Take 300 mg by mouth 3 (three) times daily.    [name] HYDROcodone-acetaminophen (NORCO/VICODIN) 5-325 MG tablet One tablet every four hours as needed for acute pain.  Limit of five days per  statue. 30 tablet 0  . hydrocortisone 1 % ointment Apply 1 application topically 2 (two) times daily. [place]  g prn  . insulin NPH-regular Human (NOVOLIN 70/30 RELION) (70-30) 100 UNIT/ML injection Inject 50 units into skin three times daily (breakfast, supper, bedtime) 10 mL 1  . linaclotide (LINZESS) 290 MCG CAPS capsule Take 1 capsule (290 mcg total) by mouth daily before breakfast. 90 capsule 3  . lisinopril (PRINIVIL,ZESTRIL) 20 MG tablet Take 1 tablet (20 mg total) by mouth daily. 30 tablet 0  . metFORMIN (GLUCOPHAGE) 1000 MG tablet Take 1 tablet (1,000 mg total) by mouth 2 (two) times daily with a meal. 60 tablet 0  . montelukast (SINGULAIR) 10 MG tablet Take 10 mg by mouth at bedtime.     . naproxen (NAPROSYN) 500 MG tablet Take 500 mg by mouth 2 (two) times daily with a meal.   5  . nystatin (MYCOSTATIN) 100000 UNIT/ML suspension Take 5 mLs (500,000 Units total) by mouth 4 (four) times daily. 473 mL 0  . nystatin-triamcinolone ointment (MYCOLOG) Apply 1 application topically 2 (two) times daily. To affected area.,use after drying tissue. 30 g prn  . ondansetron (ZOFRAN ODT) 4 MG disintegrating tablet Take 4 mg by mouth every 8 (eight) hours as needed for nausea or vomiting.     . potassium chloride (K-DUR) 10 MEQ tablet Take 10 mEq by mouth every evening.     . predniSONE (STERAPRED UNI-PAK 21 TAB) 5 MG (21) TBPK tablet Take 6 pills first day; 5 pills second day; 4 pills third day; 3 pills fourth day; 2 pills next day and 1 pill last day. 21 tablet 0  . silver sulfADIAZINE (SILVADENE) 1 % cream Apply 1 application topically  daily. 50 g 0  . sitaGLIPtin (JANUVIA) 100 MG tablet Take 100 mg by mouth daily.    . Vitamin D, Ergocalciferol, (DRISDOL) 50000 units CAPS capsule Take 50,000 Units by mouth every 7 (seven) days.     No current facility-administered medications for this visit.      Physical Exam  Blood pressure (!) 152/93, pulse 74, height 5\' 5"  (1.651 m), weight 258 lb (117 kg), last menstrual period 09/20/2015.  Constitutional: overall normal hygiene, normal nutrition, well developed, normal grooming, normal body habitus. Assistive device:none  Musculoskeletal: gait and station Limp left, muscle tone and strength are normal, no tremors or atrophy is present.  .  Neurological: coordination overall normal.  Deep tendon reflex/nerve stretch intact.  Sensation normal.  Cranial nerves II-XII intact.   Skin:   Normal overall no scars, lesions, ulcers or rashes. No psoriasis.  Psychiatric: Alert and oriented x 3.  Recent memory intact, remote memory unclear.  Normal mood and affect. Well groomed.  Good eye contact.  Cardiovascular: overall no swelling, no varicosities, no edema bilaterally, normal temperatures of the legs and arms, no clubbing, cyanosis and good capillary refill.  Lymphatic: palpation is normal.  The bilateral lower extremity is examined:  Inspection:  Thigh:  Non-tender and no defects  Knee has swelling 1+ effusion.                        Joint tenderness is present                        Patient is tender over the medial joint line  Lower Leg:  Has normal appearance and no tenderness or defects  Ankle:  Non-tender and no defects  Foot:  Non-tender and no defects Range of Motion:  Knee:  Range of motion is: 0-105 left, 0 to 110 right  Crepitus is  present  Ankle:  Range of motion is normal. Strength and Tone:  The bilateral lower extremity has normal strength and tone. Stability:  Knee:  The knee is stable.  Ankle:  The ankle is stable.   Examination  of left Upper Extremity is done.  Inspection:   Overall:  Elbow non-tender without crepitus or defects, forearm non-tender without crepitus or defects, wrist non-tender without crepitus or defects, hand non-tender.    Shoulder: with glenohumeral joint tenderness, without effusion.   Upper arm: without swelling and tenderness   Range of motion:   Overall:  Full range of motion of the elbow, full range of motion of wrist and full range of motion in fingers.   Shoulder:  left  165 degrees forward flexion; 150 degrees abduction; 35 degrees internal rotation, 35 degrees external rotation, 15 degrees extension, 40 degrees adduction.   Stability:   Overall:  Shoulder, elbow and wrist stable   Strength and Tone:   Overall full shoulder muscles strength, full upper arm strength and normal upper arm bulk and tone.  All other systems reviewed and are negative   The patient has been educated about the nature of the problem(s) and counseled on treatment options.  The patient appeared to understand what I have discussed and is in agreement with it.  Encounter Diagnoses  Name Primary?  . Pain in joint of left shoulder Yes  . Chronic pain of left knee   . Cigarette nicotine dependence without complication   . Chronic pain of right knee    X-rays were done of the left shoulder, reported separately.  PLAN Call if any problems.  Precautions discussed.  Begin Prednisone dose pack.  Pain medicine also given.   Return to clinic 2 weeks   She may need MRI of the left shoulder.   I have reviewed the [place] web site prior to prescribing narcotic medicine for this patient.   Electronically Signed Sanjuana Kava, MD 11/20/201910:05 AM

## 2018-08-07 ENCOUNTER — Ambulatory Visit: Payer: Medicaid Other | Admitting: Orthopaedic Surgery

## 2018-08-14 ENCOUNTER — Ambulatory Visit: Payer: Medicaid Other | Admitting: Orthopaedic Surgery

## 2018-08-14 ENCOUNTER — Encounter: Payer: Self-pay | Admitting: Orthopaedic Surgery

## 2018-08-14 VITALS — BP 129/73 | HR 71 | Ht 65.0 in | Wt 263.0 lb

## 2018-08-14 DIAGNOSIS — M25562 Pain in left knee: Secondary | ICD-10-CM | POA: Diagnosis not present

## 2018-08-14 DIAGNOSIS — M25512 Pain in left shoulder: Secondary | ICD-10-CM | POA: Diagnosis not present

## 2018-08-14 DIAGNOSIS — G8929 Other chronic pain: Secondary | ICD-10-CM | POA: Diagnosis not present

## 2018-08-14 DIAGNOSIS — Z6841 Body Mass Index (BMI) 40.0 and over, adult: Secondary | ICD-10-CM

## 2018-08-14 MED ORDER — HYDROCODONE-ACETAMINOPHEN 5-325 MG PO TABS: ORAL_TABLET | ORAL | 0 refills | Status: DC

## 2018-08-14 NOTE — Progress Notes (Signed)
PROCEDURE NOTE:  The patient requests injections of the left knee , verbal consent was obtained.  The left knee was prepped appropriately after time out was performed.   Sterile technique was observed and injection of 1 cc of Depo-Medrol 40 mg with several cc's of plain xylocaine. Anesthesia was provided by ethyl chloride and a 20-gauge needle was used to inject the knee area. The injection was tolerated well.  A band aid dressing was applied.  The patient was advised to apply ice later today and tomorrow to the injection sight as needed.  PROCEDURE NOTE:  The patient request injection, verbal consent was obtained.  The left shoulder was prepped appropriately after time out was performed.   Sterile technique was observed and injection of 1 cc of Depo-Medrol 40 mg with several cc's of plain xylocaine. Anesthesia was provided by ethyl chloride and a 20-gauge needle was used to inject the shoulder area. A posterior approach was used.  The injection was tolerated well.  A band aid dressing was applied.  The patient was advised to apply ice later today and tomorrow to the injection sight as needed.  I have reviewed the [place] web site prior to prescribing narcotic medicine for this patient.   Electronically Signed Sanjuana Kava, MD 12/4/20199:13 AM

## 2018-08-19 ENCOUNTER — Ambulatory Visit (HOSPITAL_COMMUNITY): Payer: Medicaid Other

## 2018-08-21 ENCOUNTER — Ambulatory Visit (HOSPITAL_COMMUNITY)
Admission: RE | Admit: 2018-08-21 | Discharge: 2018-08-21 | Disposition: A | Discharge status: Home / Self Care | Payer: Medicaid Other | Source: Ambulatory Visit | Attending: Family Medicine | Admitting: Family Medicine

## 2018-08-21 DIAGNOSIS — Z1231 Encounter for screening mammogram for malignant neoplasm of breast: Secondary | ICD-10-CM | POA: Diagnosis not present

## 2018-09-25 ENCOUNTER — Ambulatory Visit: Payer: Medicaid Other | Admitting: Orthopaedic Surgery

## 2018-09-25 ENCOUNTER — Encounter: Payer: Self-pay | Admitting: Orthopaedic Surgery

## 2018-09-25 VITALS — Ht 65.0 in | Wt 260.0 lb

## 2018-09-25 DIAGNOSIS — G8929 Other chronic pain: Secondary | ICD-10-CM

## 2018-09-25 DIAGNOSIS — Z6841 Body Mass Index (BMI) 40.0 and over, adult: Secondary | ICD-10-CM

## 2018-09-25 DIAGNOSIS — M25561 Pain in right knee: Secondary | ICD-10-CM | POA: Diagnosis not present

## 2018-09-25 DIAGNOSIS — M25562 Pain in left knee: Secondary | ICD-10-CM | POA: Diagnosis not present

## 2018-09-25 MED ORDER — HYDROCODONE-ACETAMINOPHEN 5-325 MG PO TABS: ORAL_TABLET | ORAL | 0 refills | Status: DC

## 2018-09-25 NOTE — Progress Notes (Signed)
CC: Both of my knees are hurting. I would like an injection in both knees.  The patient has had chronic pain and tenderness of both knees for some time.  Injections help.  There is no locking or giving way of the knee.  There is no new trauma. There is no redness or signs of infections.  The knees have a mild effusion and some crepitus.  There is no redness or signs of recent trauma.  Right knee ROM is 0-110 and left knee ROM is 0-105.  Impression:  Chronic pain of the both knees  Return:  1 month  PROCEDURE NOTE:  The patient requests injections of both knees, verbal consent was obtained.  The left and right knee were individually prepped appropriately after time out was performed.   Sterile technique was observed and injection of 1 cc of Depo-Medrol 40 mg with several cc's of plain xylocaine. Anesthesia was provided by ethyl chloride and a 20-gauge needle was used to inject each knee area. The injections were tolerated well.  A band aid dressing was applied.  The patient was advised to apply ice later today and tomorrow to the injection sight as needed.   Encounter Diagnoses  Name Primary?  . Chronic pain of left knee Yes  . Chronic pain of right knee   . Body mass index 40.0-44.9, adult ([place])   . Morbid obesity ([place])    I have reviewed the [place] web site prior to prescribing narcotic medicine for this patient.   Electronically Signed Sanjuana Kava, MD 1/15/20208:49 AM

## 2018-10-23 ENCOUNTER — Ambulatory Visit: Payer: Medicaid Other | Admitting: Orthopaedic Surgery

## 2018-10-23 ENCOUNTER — Encounter: Payer: Self-pay | Admitting: Orthopaedic Surgery

## 2018-10-23 ENCOUNTER — Ambulatory Visit (INDEPENDENT_AMBULATORY_CARE_PROVIDER_SITE_OTHER): Payer: Medicaid Other

## 2018-10-23 VITALS — BP 100/70 | HR 72 | Ht 65.0 in | Wt 251.0 lb

## 2018-10-23 DIAGNOSIS — M25561 Pain in right knee: Secondary | ICD-10-CM | POA: Diagnosis not present

## 2018-10-23 DIAGNOSIS — G8929 Other chronic pain: Secondary | ICD-10-CM | POA: Diagnosis not present

## 2018-10-23 DIAGNOSIS — Z6841 Body Mass Index (BMI) 40.0 and over, adult: Secondary | ICD-10-CM | POA: Diagnosis not present

## 2018-10-23 DIAGNOSIS — M25562 Pain in left knee: Secondary | ICD-10-CM

## 2018-10-23 DIAGNOSIS — F17210 Nicotine dependence, cigarettes, uncomplicated: Secondary | ICD-10-CM

## 2018-10-23 DIAGNOSIS — Z7689 Persons encountering health services in other specified circumstances: Secondary | ICD-10-CM | POA: Diagnosis not present

## 2018-10-23 MED ORDER — HYDROCODONE-ACETAMINOPHEN 5-325 MG PO TABS: ORAL_TABLET | ORAL | 0 refills | Status: DC

## 2018-10-23 NOTE — Progress Notes (Signed)
Patient Sonya James, female DOB:1966-01-31, 53 y.o. ZDG:[id number]  Chief Complaint  Patient presents with  . Knee Pain    HPI  Sonya James is a 53 y.o. female who has bilateral knee pain.  She fell a few days ago and hurt her right knee. She has anterior swelling and patella pain plus bruising.  She has pain with ambulation.  I will get X-rays.  She wants injections in both knees.   Body mass index is 41.77 kg/m.  The patient meets the AMA guidelines for Morbid (severe) obesity with a BMI > 40.0 and I have recommended weight loss.   ROS  Review of Systems  HENT: Negative for congestion.   Respiratory: Positive for cough and shortness of breath.   Cardiovascular: Positive for leg swelling. Negative for chest pain.  Endocrine: Positive for cold intolerance.  Musculoskeletal: Positive for arthralgias, gait problem and joint swelling.  Allergic/Immunologic: Positive for environmental allergies.  Neurological: Positive for headaches.  Psychiatric/Behavioral: The patient is nervous/anxious.     All other systems reviewed and are negative.  The following is a summary of the past history medically, past history surgically, known current medicines, social history and family history.  This information is gathered electronically by the computer from prior information and documentation.  I review this each visit and have found including this information at this point in the chart is beneficial and informative.    Past Medical History:  Diagnosis Date  . Anemia   . Anxiety   . Arthritis   . Asthma   . Autonomic neuropathy   . Constipation   . COPD (chronic obstructive pulmonary disease) ([place])   . CTS (carpal tunnel syndrome)   . Depression   . Diabetes mellitus without complication ([place])   . Endometrial cancer ([place]) 2017  . GERD (gastroesophageal reflux disease)   . Headache(784.0)   . HTN (hypertension)   . Hypercholesterolemia   . Hypothyroidism   . IBS (irritable  bowel syndrome)   . Osteoarthritis    osteoarthritis  . Pancreatitis    per patient   . Recurrent boils    buttocks and low back.  . Shortness of breath   . Sleep apnea    CPAP machine  . Uterine cancer ([place]) 2017  . Vitamin D deficiency     Past Surgical History:  Procedure Laterality Date  . ABDOMINAL HYSTERECTOMY  2018   endometrial cancer, [facility] at [facility]   . BIOPSY  01/02/2017   Procedure: BIOPSY;  Surgeon: Danie Binder, MD;  [facility];  Service: Endoscopy;;  gastric biopsy  . CESAREAN SECTION  1989  . COLONOSCOPY WITH PROPOFOL N/A 01/02/2017   Dr. Oneida Alar: redundant colon, two 2-3 mm polyps in sigmoid colon (hyperplastic)  . ESOPHAGOGASTRODUODENOSCOPY  05/22/2011   Dr. Oneida Alar: H.pylori gastritis   . ESOPHAGOGASTRODUODENOSCOPY (EGD) WITH PROPOFOL N/A 01/02/2017   Dr. 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 FOOT SURGERY Left    tendon repair  . OOPHORECTOMY    . POLYPECTOMY  01/02/2017   Procedure: POLYPECTOMY;  Surgeon: Danie Binder, MD;  [facility];  Service: Endoscopy;;  sigmoid colon polyps times 2  . REDUCTION MAMMAPLASTY  1996  . SAVORY DILATION N/A 01/02/2017   Procedure: SAVORY DILATION;  Surgeon: Danie Binder, MD;  [facility];  Service: Endoscopy;  Laterality: N/A;  . TUBAL LIGATION      Family History  Problem Relation Age of Onset  . Diabetes Mother   . Hypertension Mother   . COPD Mother   . Asthma Mother   . Hypercholesterolemia Mother   . Hypertension Sister   . Hyperlipidemia Sister   . Diabetes Sister   . Asthma Brother   . Alcohol abuse Brother   . Asthma Son   . Cancer Maternal Grandmother        lung, throat, breast  . Alzheimer's disease Maternal Grandmother   . Hypertension Maternal Grandmother   . Hypercholesterolemia Maternal Grandmother   . Heart disease Maternal Grandfather   . Stroke Maternal Grandfather   . Clotting  disorder Maternal Grandfather        blood clots in legs  . Hypertension Sister   . Hyperlipidemia Sister   . Stroke Maternal Aunt   . Clotting disorder Maternal Aunt        blood clots in legs  . Hypertension Maternal Aunt   . Hypercholesterolemia Maternal Aunt   . Hypertension Maternal Aunt   . Asthma Maternal Aunt   . Clotting disorder Maternal Uncle        blood clot -legs traveled to heart and he died of heart attack  . Colon cancer Neg Hx     Social History Social History   Tobacco Use  . Smoking status: Current Some Day Smoker    Packs/day: 0.50    Years: 22.00    Pack years: 11.00    Types: Cigarettes  . Smokeless tobacco: Never Used  Substance Use Topics  . Alcohol use: No  . Drug use: No    No Known Allergies  Current Outpatient Medications  Medication Sig Dispense Refill  . albuterol (PROVENTIL HFA;VENTOLIN HFA) 108 (90 Base) MCG/ACT inhaler Inhale 2 puffs into the lungs every 6 (six) hours as needed for wheezing or shortness of breath.    [name] albuterol (PROVENTIL) (2.5 MG/3ML) 0.083% nebulizer solution Take 3 mLs (2.5 mg total) by nebulization every 6 (six) hours as needed for wheezing. (Patient not taking: Reported on 07/03/2018) 75 mL 12  . apixaban (ELIQUIS) 5 MG TABS tablet Take 2 tablets (10 mg total) by mouth 2 (two) times daily. On 08/12/17 @ 9PM, start 1 tab (5 mg) two times daily. (Patient taking differently: Take 5 mg by mouth 2 (two) times daily. On 08/12/17 @ 9PM, start 1 tab (5 mg) two times daily.) 72 tablet 0  . atorvastatin (LIPITOR) 40 MG tablet Take 40 mg by mouth daily.    . cycloSPORINE (RESTASIS) 0.05 % ophthalmic emulsion Place 1 drop into both eyes daily.    [name] doxycycline (VIBRAMYCIN) 100 MG capsule Take 1 capsule (100 mg total) by mouth 2 (two) times daily. 14 capsule 1  . esomeprazole (NEXIUM) 40 MG capsule Take 1 capsule (40 mg total) by mouth 2 (two) times daily before a meal. 60 capsule 11  . fluconazole (DIFLUCAN) 150 MG tablet Take 1  tablet (150 mg total) by mouth every 3 (three) days. 2 tablet 1  . furosemide (LASIX) 40 MG tablet Take 1 tablet (40 mg total) by mouth daily. 30 tablet 0  . gabapentin (NEURONTIN) 300 MG capsule Take 300 mg by mouth 3 (three) times daily.    [name] HYDROcodone-acetaminophen (NORCO/VICODIN) 5-325 MG tablet One tablet every four hours as needed for acute pain.  Limit of five days per [place]. 30 tablet 0  . hydrocortisone 1 % ointment Apply 1 application topically 2 (two) times daily. 30 g prn  .  insulin NPH-regular Human (NOVOLIN 70/30 RELION) (70-30) 100 UNIT/ML injection Inject 50 units into skin three times daily (breakfast, supper, bedtime) 10 mL 1  . linaclotide (LINZESS) 290 MCG CAPS capsule Take 1 capsule (290 mcg total) by mouth daily before breakfast. 90 capsule 3  . lisinopril (PRINIVIL,ZESTRIL) 20 MG tablet Take 1 tablet (20 mg total) by mouth daily. 30 tablet 0  . metFORMIN (GLUCOPHAGE) 1000 MG tablet Take 1 tablet (1,000 mg total) by mouth 2 (two) times daily with a meal. 60 tablet 0  . montelukast (SINGULAIR) 10 MG tablet Take 10 mg by mouth at bedtime.     . naproxen (NAPROSYN) 500 MG tablet Take 500 mg by mouth 2 (two) times daily with a meal.   5  . nystatin (MYCOSTATIN) 100000 UNIT/ML suspension Take 5 mLs (500,000 Units total) by mouth 4 (four) times daily. 473 mL 0  . nystatin-triamcinolone ointment (MYCOLOG) Apply 1 application topically 2 (two) times daily. To affected area.,use after drying tissue. 30 g prn  . ondansetron (ZOFRAN ODT) 4 MG disintegrating tablet Take 4 mg by mouth every 8 (eight) hours as needed for nausea or vomiting.     . potassium chloride (K-DUR) 10 MEQ tablet Take 10 mEq by mouth every evening.     . predniSONE (STERAPRED UNI-PAK 21 TAB) 5 MG (21) TBPK tablet Take 6 pills first day; 5 pills second day; 4 pills third day; 3 pills fourth day; 2 pills next day and 1 pill last day. 21 tablet 0  . silver sulfADIAZINE (SILVADENE) 1 % cream Apply 1 application  topically daily. 50 g 0  . sitaGLIPtin (JANUVIA) 100 MG tablet Take 100 mg by mouth daily.    . Vitamin D, Ergocalciferol, (DRISDOL) 50000 units CAPS capsule Take 50,000 Units by mouth every 7 (seven) days.     No current facility-administered medications for this visit.      Physical Exam  Blood pressure 100/70, pulse 72, height 5\' 5"  (1.651 m), weight 251 lb (113.9 kg), last menstrual period 09/20/2015.  Constitutional: overall normal hygiene, normal nutrition, well developed, normal grooming, normal body habitus. Assistive device:none  Musculoskeletal: gait and station Limp right, muscle tone and strength are normal, no tremors or atrophy is present.  .  Neurological: coordination overall normal.  Deep tendon reflex/nerve stretch intact.  Sensation normal.  Cranial nerves II-XII intact.   Skin:   Normal overall no scars, lesions, ulcers or rashes. No psoriasis.  Psychiatric: Alert and oriented x 3.  Recent memory intact, remote memory unclear.  Normal mood and affect. Well groomed.  Good eye contact.  Cardiovascular: overall no swelling, no varicosities, no edema bilaterally, normal temperatures of the legs and arms, no clubbing, cyanosis and good capillary refill.  Lymphatic: palpation is normal.  Right knee has effusion, pain over the patella more laterally, ROM 0 to 100 with pain and crepitus.  Limp to the right.  Left knee has effusion, ROM 0 to 110, crepitus, NV intact.  All other systems reviewed and are negative   The patient has been educated about the nature of the problem(s) and counseled on treatment options.  The patient appeared to understand what I have discussed and is in agreement with it.  Encounter Diagnoses  Name Primary?  . Chronic pain of right knee Yes  . Chronic pain of left knee   . Body mass index 40.0-44.9, adult ([place])   . Morbid obesity ([place])   . Cigarette nicotine dependence without complication     The  patient was counseled about smoking and  smoking cessation.  I spent about four  minutes in doing this.  I, of course, determined the patient does smoke and frequency.  The patient smokes  4 to 6 cigarettes a day down from two packs a day.  I have advised the patient of problems medically that can occur with continued smoking including poor wound and bone healing as well as the obvious respiratory problems.  I have assessed the patient's willingness to cut back and quit smoking.  The patient has stated she is willing to stop but says it will be hard.  It was hard for her to cut back where she is now.  I have told the patient to discuss with their family doctor also about smoking cessation.  I am willing to assist my patient in arranging this and to get additional help.I have suggested the patient have a set date to try to begin cutting back.  I have printed out a smoking cessation form about how to begin cutting back and suggestions.  I have recommended starting an immediate plan to cut back.  I will discuss this further when I see the patient in the future.  I have stressed that although this will be very difficult to accomplish, the benefits are very substantial and will give long term health benefits from now on.  PROCEDURE NOTE:  The patient requests injections of the left knee , verbal consent was obtained.  The left knee was prepped appropriately after time out was performed.   Sterile technique was observed and injection of 1 cc of Depo-Medrol 40 mg with several cc's of plain xylocaine. Anesthesia was provided by ethyl chloride and a 20-gauge needle was used to inject the knee area. The injection was tolerated well.  A band aid dressing was applied.  The patient was advised to apply ice later today and tomorrow to the injection sight as needed.  PROCEDURE NOTE:  The patient requests injections of the right knee , verbal consent was obtained.  The right knee was prepped appropriately after time out was performed.   Sterile  technique was observed and injection of 1 cc of Depo-Medrol 40 mg with several cc's of plain xylocaine. Anesthesia was provided by ethyl chloride and a 20-gauge needle was used to inject the knee area. The injection was tolerated well.  A band aid dressing was applied.  The patient was advised to apply ice later today and tomorrow to the injection sight as needed.  PLAN Call if any problems.  Precautions discussed.  Continue current medications.   Return to clinic 1 month   Electronically Signed Sanjuana Kava, MD 2/12/20209:26 AM

## 2018-10-29 DIAGNOSIS — F331 Major depressive disorder, recurrent, moderate: Secondary | ICD-10-CM | POA: Diagnosis not present

## 2018-11-13 NOTE — Telephone Encounter (Signed)
Note sent to nurse. 

## 2018-11-20 ENCOUNTER — Encounter: Payer: Self-pay | Admitting: Orthopaedic Surgery

## 2018-11-20 ENCOUNTER — Other Ambulatory Visit: Payer: Self-pay

## 2018-11-20 ENCOUNTER — Ambulatory Visit: Payer: Medicaid Other | Admitting: Orthopaedic Surgery

## 2018-11-20 ENCOUNTER — Ambulatory Visit (INDEPENDENT_AMBULATORY_CARE_PROVIDER_SITE_OTHER): Payer: Medicaid Other

## 2018-11-20 VITALS — BP 143/85 | HR 61 | Ht 65.0 in | Wt 256.0 lb

## 2018-11-20 DIAGNOSIS — M25562 Pain in left knee: Secondary | ICD-10-CM

## 2018-11-20 DIAGNOSIS — M79645 Pain in left finger(s): Secondary | ICD-10-CM

## 2018-11-20 DIAGNOSIS — M25561 Pain in right knee: Secondary | ICD-10-CM

## 2018-11-20 DIAGNOSIS — S62631A Displaced fracture of distal phalanx of left index finger, initial encounter for closed fracture: Secondary | ICD-10-CM

## 2018-11-20 DIAGNOSIS — Z6841 Body Mass Index (BMI) 40.0 and over, adult: Secondary | ICD-10-CM | POA: Diagnosis not present

## 2018-11-20 DIAGNOSIS — G8929 Other chronic pain: Secondary | ICD-10-CM

## 2018-11-20 DIAGNOSIS — Z7689 Persons encountering health services in other specified circumstances: Secondary | ICD-10-CM | POA: Diagnosis not present

## 2018-11-20 DIAGNOSIS — F17210 Nicotine dependence, cigarettes, uncomplicated: Secondary | ICD-10-CM

## 2018-11-20 MED ORDER — HYDROCODONE-ACETAMINOPHEN 5-325 MG PO TABS: ORAL_TABLET | ORAL | 0 refills | Status: DC

## 2018-11-20 NOTE — Progress Notes (Signed)
Patient Sonya James, female DOB:[birth year], 53 y.o. PXT:[id number]  Chief Complaint  Patient presents with  . Knee Pain    and left index finger pain    HPI  Sonya James is a 53 y.o. female who has knee pain bilaterally.  They are better but still hurt with weather changes and activity.  She has a cane.  She has swelling and popping.  She fell in bathtub and hurt her left nondominant index finger. She has swelling and pain and decreased motion. She has soaked it and taken tylenol with no help.   Body mass index is 42.6 kg/m.  The patient meets the AMA guidelines for Morbid (severe) obesity with a BMI > 40.0 and I have recommended weight loss.   ROS  Review of Systems  HENT: Negative for congestion.   Respiratory: Positive for cough and shortness of breath.   Cardiovascular: Positive for leg swelling. Negative for chest pain.  Endocrine: Positive for cold intolerance.  Musculoskeletal: Positive for arthralgias, gait problem and joint swelling.  Allergic/Immunologic: Positive for environmental allergies.  Neurological: Positive for headaches.  Psychiatric/Behavioral: The patient is nervous/anxious.     All other systems reviewed and are negative.  The following is a summary of the past history medically, past history surgically, known current medicines, social history and family history.  This information is gathered electronically by the computer from prior information and documentation.  I review this each visit and have found including this information at this point in the chart is beneficial and informative.    Past Medical History:  Diagnosis Date  . Anemia   . Anxiety   . Arthritis   . Asthma   . Autonomic neuropathy   . Constipation   . COPD (chronic obstructive pulmonary disease) ([place])   . CTS (carpal tunnel syndrome)   . Depression   . Diabetes mellitus without complication ([place])   . Endometrial cancer ([place]) 2017  . GERD (gastroesophageal reflux disease)    . Headache(784.0)   . HTN (hypertension)   . Hypercholesterolemia   . Hypothyroidism   . IBS (irritable bowel syndrome)   . Osteoarthritis    osteoarthritis  . Pancreatitis    per patient   . Recurrent boils    buttocks and low back.  . Shortness of breath   . Sleep apnea    CPAP machine  . Uterine cancer ([place]) 2017  . Vitamin D deficiency     Past Surgical History:  Procedure Laterality Date  . ABDOMINAL HYSTERECTOMY  2018   endometrial cancer, surgery done at [facility]   . BIOPSY  01/02/2017   Procedure: BIOPSY;  Surgeon: Danie Binder, MD;  [facility];  Service: Endoscopy;;  gastric biopsy  . CESAREAN SECTION  1989  . COLONOSCOPY WITH PROPOFOL N/A 01/02/2017   Dr. Oneida Alar: redundant colon, two 2-3 mm polyps in sigmoid colon (hyperplastic)  . ESOPHAGOGASTRODUODENOSCOPY  05/22/2011   Dr. Oneida Alar: H.pylori gastritis   . ESOPHAGOGASTRODUODENOSCOPY (EGD) WITH PROPOFOL N/A 01/02/2017   Dr. 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 FOOT SURGERY Left    tendon repair  . OOPHORECTOMY    . POLYPECTOMY  01/02/2017   Procedure: POLYPECTOMY;  Surgeon: Danie Binder, MD;  [facility];  Service: Endoscopy;;  sigmoid colon polyps times 2  . REDUCTION MAMMAPLASTY  1996  . SAVORY DILATION N/A 01/02/2017   Procedure: SAVORY DILATION;  Surgeon: Danie Binder, MD;  [facility];  Service: Endoscopy;  Laterality: N/A;  . TUBAL LIGATION      Family History  Problem Relation Age of Onset  . Diabetes Mother   . Hypertension Mother   . COPD Mother   . Asthma Mother   . Hypercholesterolemia Mother   . Hypertension Sister   . Hyperlipidemia Sister   . Diabetes Sister   . Asthma Brother   . Alcohol abuse Brother   . Asthma Son   . Cancer Maternal Grandmother        lung, throat, breast  . Alzheimer's disease Maternal Grandmother   . Hypertension Maternal Grandmother   . Hypercholesterolemia  Maternal Grandmother   . Heart disease Maternal Grandfather   . Stroke Maternal Grandfather   . Clotting disorder Maternal Grandfather        blood clots in legs  . Hypertension Sister   . Hyperlipidemia Sister   . Stroke Maternal Aunt   . Clotting disorder Maternal Aunt        blood clots in legs  . Hypertension Maternal Aunt   . Hypercholesterolemia Maternal Aunt   . Hypertension Maternal Aunt   . Asthma Maternal Aunt   . Clotting disorder Maternal Uncle        blood clot -legs traveled to heart and he died of heart attack  . Colon cancer Neg Hx     Social History Social History   Tobacco Use  . Smoking status: Current Some Day Smoker    Packs/day: 0.50    Years: 22.00    Pack years: 11.00    Types: Cigarettes  . Smokeless tobacco: Never Used  Substance Use Topics  . Alcohol use: No  . Drug use: No    No Known Allergies  Current Outpatient Medications  Medication Sig Dispense Refill  . albuterol (PROVENTIL HFA;VENTOLIN HFA) 108 (90 Base) MCG/ACT inhaler Inhale 2 puffs into the lungs every 6 (six) hours as needed for wheezing or shortness of breath.    [name] albuterol (PROVENTIL) (2.5 MG/3ML) 0.083% nebulizer solution Take 3 mLs (2.5 mg total) by nebulization every 6 (six) hours as needed for wheezing. (Patient not taking: Reported on 07/03/2018) 75 mL 12  . apixaban (ELIQUIS) 5 MG TABS tablet Take 2 tablets (10 mg total) by mouth 2 (two) times daily. On 08/12/17 @ 9PM, start 1 tab (5 mg) two times daily. (Patient taking differently: Take 5 mg by mouth 2 (two) times daily. On 08/12/17 @ 9PM, start 1 tab (5 mg) two times daily.) 72 tablet 0  . atorvastatin (LIPITOR) 40 MG tablet Take 40 mg by mouth daily.    . cycloSPORINE (RESTASIS) 0.05 % ophthalmic emulsion Place 1 drop into both eyes daily.    [name] doxycycline (VIBRAMYCIN) 100 MG capsule Take 1 capsule (100 mg total) by mouth 2 (two) times daily. 14 capsule 1  . esomeprazole (NEXIUM) 40 MG capsule Take 1 capsule (40 mg total)  by mouth 2 (two) times daily before a meal. 60 capsule 11  . fluconazole (DIFLUCAN) 150 MG tablet Take 1 tablet (150 mg total) by mouth every 3 (three) days. 2 tablet 1  . furosemide (LASIX) 40 MG tablet Take 1 tablet (40 mg total) by mouth daily. 30 tablet 0  . gabapentin (NEURONTIN) 300 MG capsule Take 300 mg by mouth 3 (three) times daily.    [name] HYDROcodone-acetaminophen (NORCO/VICODIN) 5-325 MG tablet One tablet every six hours for pain.  Limit 7 days. 28 tablet 0  . hydrocortisone 1 %  ointment Apply 1 application topically 2 (two) times daily. 30 g prn  . insulin NPH-regular Human (NOVOLIN 70/30 RELION) (70-30) 100 UNIT/ML injection Inject 50 units into skin three times daily (breakfast, supper, bedtime) 10 mL 1  . linaclotide (LINZESS) 290 MCG CAPS capsule Take 1 capsule (290 mcg total) by mouth daily before breakfast. 90 capsule 3  . lisinopril (PRINIVIL,ZESTRIL) 20 MG tablet Take 1 tablet (20 mg total) by mouth daily. 30 tablet 0  . metFORMIN (GLUCOPHAGE) 1000 MG tablet Take 1 tablet (1,000 mg total) by mouth 2 (two) times daily with a meal. 60 tablet 0  . montelukast (SINGULAIR) 10 MG tablet Take 10 mg by mouth at bedtime.     . naproxen (NAPROSYN) 500 MG tablet Take 500 mg by mouth 2 (two) times daily with a meal.   5  . nystatin (MYCOSTATIN) 100000 UNIT/ML suspension Take 5 mLs (500,000 Units total) by mouth 4 (four) times daily. 473 mL 0  . nystatin-triamcinolone ointment (MYCOLOG) Apply 1 application topically 2 (two) times daily. To affected area.,use after drying tissue. 30 g prn  . ondansetron (ZOFRAN ODT) 4 MG disintegrating tablet Take 4 mg by mouth every 8 (eight) hours as needed for nausea or vomiting.     . potassium chloride (K-DUR) 10 MEQ tablet Take 10 mEq by mouth every evening.     . predniSONE (STERAPRED UNI-PAK 21 TAB) 5 MG (21) TBPK tablet Take 6 pills first day; 5 pills second day; 4 pills third day; 3 pills fourth day; 2 pills next day and 1 pill last day. 21 tablet 0   . silver sulfADIAZINE (SILVADENE) 1 % cream Apply 1 application topically daily. 50 g 0  . sitaGLIPtin (JANUVIA) 100 MG tablet Take 100 mg by mouth daily.    . Vitamin D, Ergocalciferol, (DRISDOL) 50000 units CAPS capsule Take 50,000 Units by mouth every 7 (seven) days.     No current facility-administered medications for this visit.      Physical Exam  Blood pressure (!) 143/85, pulse 61, height 5\' 5"  (1.651 m), weight 256 lb (116.1 kg), last menstrual period 09/20/2015.  Constitutional: overall normal hygiene, normal nutrition, well developed, normal grooming, normal body habitus. Assistive device:cane  Musculoskeletal: gait and station Limp right, muscle tone and strength are normal, no tremors or atrophy is present.  .  Neurological: coordination overall normal.  Deep tendon reflex/nerve stretch intact.  Sensation normal.  Cranial nerves II-XII intact.   Skin:   Normal overall no scars, lesions, ulcers or rashes. No psoriasis.  Psychiatric: Alert and oriented x 3.  Recent memory intact, remote memory unclear.  Normal mood and affect. Well groomed.  Good eye contact.  Cardiovascular: overall no swelling, no varicosities, no edema bilaterally, normal temperatures of the legs and arms, no clubbing, cyanosis and good capillary refill.  Lymphatic: palpation is normal.  Both knees have effusion, crepitus, and ROM of right is 0 to 100, left 0 to 110, NV intact.  Limp right.  The left index finger DIP joint is swollen, tender has decreased ROM and pain.  NV intact.  All other systems reviewed and are negative   The patient has been educated about the nature of the problem(s) and counseled on treatment options.  The patient appeared to understand what I have discussed and is in agreement with it.  Encounter Diagnoses  Name Primary?  . Finger pain, left Yes  . Closed displaced fracture of distal phalanx of left index finger, initial encounter   .  Body mass index 40.0-44.9, adult  ([place])   . Morbid obesity ([place])   . Cigarette nicotine dependence without complication   . Chronic pain of right knee   . Chronic pain of left knee    X-rays of the index finger left was done, reported separately. Avulsion fracture present dorsally.  A splint was made for her index finger.  I gave extra tape and told her how to use the splint.  PLAN Call if any problems.  Precautions discussed.  Continue current medications.   Return to clinic 2 weeks   I have reviewed the [place] web site prior to prescribing narcotic medicine for this patient.   Electronically Signed Sanjuana Kava, MD 3/11/202010:20 AM

## 2018-11-21 DIAGNOSIS — M25562 Pain in left knee: Secondary | ICD-10-CM | POA: Diagnosis not present

## 2018-11-21 DIAGNOSIS — M25561 Pain in right knee: Secondary | ICD-10-CM | POA: Diagnosis not present

## 2018-12-03 ENCOUNTER — Other Ambulatory Visit: Payer: Self-pay | Admitting: Radiology

## 2018-12-03 MED ORDER — HYDROCODONE-ACETAMINOPHEN 5-325 MG PO TABS: ORAL_TABLET | ORAL | 0 refills | Status: DC

## 2018-12-03 NOTE — Telephone Encounter (Signed)
I called back to patient; scheduled appointment for 01/21/19 for bilateral knee pain. Per note by Acquanetta Belling, Refill request for:  HYDROcodone-acetaminophen (NORCO/VICODIN) 5-325 MG tablet 28 tablet   - [place]

## 2018-12-03 NOTE — Telephone Encounter (Signed)
I called patient and she is fine, is willing to RS appointment until after COVID 19 risk has passed. She states her finger feels fine  She requests refill on Hydrocodone

## 2018-12-04 ENCOUNTER — Ambulatory Visit: Payer: Medicaid Other | Admitting: Orthopaedic Surgery

## 2018-12-27 DIAGNOSIS — M25676 Stiffness of unspecified foot, not elsewhere classified: Secondary | ICD-10-CM | POA: Diagnosis not present

## 2018-12-27 DIAGNOSIS — L0292 Furuncle, unspecified: Secondary | ICD-10-CM | POA: Diagnosis not present

## 2018-12-27 DIAGNOSIS — Z86718 Personal history of other venous thrombosis and embolism: Secondary | ICD-10-CM | POA: Diagnosis not present

## 2019-01-07 ENCOUNTER — Other Ambulatory Visit: Payer: Self-pay | Admitting: Radiology

## 2019-01-07 NOTE — Telephone Encounter (Signed)
Patient called, left message asking for Dr Luna Glasgow to send her a Rx for her knee pain to [facility] in [place].

## 2019-01-08 MED ORDER — HYDROCODONE-ACETAMINOPHEN 5-325 MG PO TABS: ORAL_TABLET | ORAL | 0 refills | Status: DC

## 2019-01-08 NOTE — Addendum Note (Signed)
Addended by: Elizabeth Sauer on: 01/08/2019 08:06 AM   Modules accepted: Orders

## 2019-01-08 NOTE — Addendum Note (Signed)
Addended by: Willette Pa on: 01/08/2019 08:10 AM   Modules accepted: Orders

## 2019-01-21 ENCOUNTER — Ambulatory Visit: Payer: Medicaid Other | Admitting: Orthopaedic Surgery

## 2019-01-21 ENCOUNTER — Other Ambulatory Visit: Payer: Self-pay

## 2019-01-21 ENCOUNTER — Encounter: Payer: Self-pay | Admitting: Orthopaedic Surgery

## 2019-01-21 VITALS — Temp 96.9°F | Ht 65.0 in | Wt 256.0 lb

## 2019-01-21 DIAGNOSIS — Z7689 Persons encountering health services in other specified circumstances: Secondary | ICD-10-CM | POA: Diagnosis not present

## 2019-01-21 DIAGNOSIS — G8929 Other chronic pain: Secondary | ICD-10-CM

## 2019-01-21 DIAGNOSIS — M25562 Pain in left knee: Secondary | ICD-10-CM | POA: Diagnosis not present

## 2019-01-21 DIAGNOSIS — M25561 Pain in right knee: Secondary | ICD-10-CM | POA: Diagnosis not present

## 2019-01-21 DIAGNOSIS — Z6841 Body Mass Index (BMI) 40.0 and over, adult: Secondary | ICD-10-CM

## 2019-01-21 NOTE — Progress Notes (Signed)
CC: Both of my knees are hurting. I would like an injection in both knees.  The patient has had chronic pain and tenderness of both knees for some time.  Injections help.  There is no locking or giving way of the knee.  There is no new trauma. There is no redness or signs of infections.  The knees have a mild effusion and some crepitus.  There is no redness or signs of recent trauma.  Right knee ROM is 0-105 and left knee ROM is 0-110.  Impression:  Chronic pain of the both knees  Return:  as needed  PROCEDURE NOTE:  The patient requests injections of both knees, verbal consent was obtained.  The left and right knee were individually prepped appropriately after time out was performed.   Sterile technique was observed and injection of 1 cc of Depo-Medrol 40 mg with several cc's of plain xylocaine. Anesthesia was provided by ethyl chloride and a 20-gauge needle was used to inject each knee area. The injections were tolerated well.  A band aid dressing was applied.  The patient was advised to apply ice later today and tomorrow to the injection sight as needed.   Electronically Signed Sanjuana Kava, MD 5/12/20208:31 AM

## 2019-01-27 DIAGNOSIS — E1165 Type 2 diabetes mellitus with hyperglycemia: Secondary | ICD-10-CM | POA: Diagnosis not present

## 2019-01-27 DIAGNOSIS — Z79899 Other long term (current) drug therapy: Secondary | ICD-10-CM | POA: Diagnosis not present

## 2019-01-27 DIAGNOSIS — Z794 Long term (current) use of insulin: Secondary | ICD-10-CM | POA: Diagnosis not present

## 2019-01-27 DIAGNOSIS — Z7689 Persons encountering health services in other specified circumstances: Secondary | ICD-10-CM | POA: Diagnosis not present

## 2019-01-31 ENCOUNTER — Inpatient Hospital Stay (HOSPITAL_COMMUNITY): Payer: Medicaid Other | Attending: Hematology

## 2019-01-31 ENCOUNTER — Other Ambulatory Visit: Payer: Self-pay

## 2019-01-31 DIAGNOSIS — Z79899 Other long term (current) drug therapy: Secondary | ICD-10-CM | POA: Insufficient documentation

## 2019-01-31 DIAGNOSIS — I10 Essential (primary) hypertension: Secondary | ICD-10-CM | POA: Insufficient documentation

## 2019-01-31 DIAGNOSIS — Z86711 Personal history of pulmonary embolism: Secondary | ICD-10-CM | POA: Diagnosis not present

## 2019-01-31 DIAGNOSIS — G4730 Sleep apnea, unspecified: Secondary | ICD-10-CM | POA: Diagnosis not present

## 2019-01-31 DIAGNOSIS — K219 Gastro-esophageal reflux disease without esophagitis: Secondary | ICD-10-CM | POA: Diagnosis not present

## 2019-01-31 DIAGNOSIS — E039 Hypothyroidism, unspecified: Secondary | ICD-10-CM | POA: Diagnosis not present

## 2019-01-31 DIAGNOSIS — E559 Vitamin D deficiency, unspecified: Secondary | ICD-10-CM | POA: Diagnosis not present

## 2019-01-31 DIAGNOSIS — E7800 Pure hypercholesterolemia, unspecified: Secondary | ICD-10-CM | POA: Diagnosis not present

## 2019-01-31 DIAGNOSIS — M1990 Unspecified osteoarthritis, unspecified site: Secondary | ICD-10-CM | POA: Diagnosis not present

## 2019-01-31 DIAGNOSIS — Z8541 Personal history of malignant neoplasm of cervix uteri: Secondary | ICD-10-CM | POA: Insufficient documentation

## 2019-01-31 DIAGNOSIS — Z7901 Long term (current) use of anticoagulants: Secondary | ICD-10-CM | POA: Diagnosis not present

## 2019-01-31 DIAGNOSIS — J449 Chronic obstructive pulmonary disease, unspecified: Secondary | ICD-10-CM | POA: Insufficient documentation

## 2019-01-31 DIAGNOSIS — F329 Major depressive disorder, single episode, unspecified: Secondary | ICD-10-CM | POA: Insufficient documentation

## 2019-01-31 DIAGNOSIS — E119 Type 2 diabetes mellitus without complications: Secondary | ICD-10-CM | POA: Insufficient documentation

## 2019-01-31 DIAGNOSIS — I2699 Other pulmonary embolism without acute cor pulmonale: Secondary | ICD-10-CM

## 2019-01-31 DIAGNOSIS — Z7689 Persons encountering health services in other specified circumstances: Secondary | ICD-10-CM | POA: Diagnosis not present

## 2019-01-31 DIAGNOSIS — F17210 Nicotine dependence, cigarettes, uncomplicated: Secondary | ICD-10-CM | POA: Insufficient documentation

## 2019-01-31 DIAGNOSIS — M129 Arthropathy, unspecified: Secondary | ICD-10-CM | POA: Insufficient documentation

## 2019-01-31 LAB — D-DIMER, QUANTITATIVE: D-Dimer, Quant: <0.27 ug{FEU}/mL (ref 0.00–0.50)

## 2019-02-06 DIAGNOSIS — E119 Type 2 diabetes mellitus without complications: Secondary | ICD-10-CM | POA: Diagnosis not present

## 2019-02-07 ENCOUNTER — Other Ambulatory Visit: Payer: Self-pay

## 2019-02-07 ENCOUNTER — Encounter (HOSPITAL_COMMUNITY): Payer: Self-pay | Admitting: Hematology

## 2019-02-07 ENCOUNTER — Inpatient Hospital Stay (HOSPITAL_BASED_OUTPATIENT_CLINIC_OR_DEPARTMENT_OTHER): Payer: Medicaid Other | Admitting: Hematology

## 2019-02-07 VITALS — BP 123/73 | HR 80 | Temp 98.1°F | Resp 14 | Wt 245.8 lb

## 2019-02-07 DIAGNOSIS — M1990 Unspecified osteoarthritis, unspecified site: Secondary | ICD-10-CM

## 2019-02-07 DIAGNOSIS — Z8541 Personal history of malignant neoplasm of cervix uteri: Secondary | ICD-10-CM

## 2019-02-07 DIAGNOSIS — E7800 Pure hypercholesterolemia, unspecified: Secondary | ICD-10-CM

## 2019-02-07 DIAGNOSIS — G4730 Sleep apnea, unspecified: Secondary | ICD-10-CM | POA: Diagnosis not present

## 2019-02-07 DIAGNOSIS — E119 Type 2 diabetes mellitus without complications: Secondary | ICD-10-CM

## 2019-02-07 DIAGNOSIS — Z79899 Other long term (current) drug therapy: Secondary | ICD-10-CM

## 2019-02-07 DIAGNOSIS — F329 Major depressive disorder, single episode, unspecified: Secondary | ICD-10-CM | POA: Diagnosis not present

## 2019-02-07 DIAGNOSIS — F17210 Nicotine dependence, cigarettes, uncomplicated: Secondary | ICD-10-CM

## 2019-02-07 DIAGNOSIS — E559 Vitamin D deficiency, unspecified: Secondary | ICD-10-CM | POA: Diagnosis not present

## 2019-02-07 DIAGNOSIS — Z86711 Personal history of pulmonary embolism: Secondary | ICD-10-CM | POA: Diagnosis not present

## 2019-02-07 DIAGNOSIS — M129 Arthropathy, unspecified: Secondary | ICD-10-CM

## 2019-02-07 DIAGNOSIS — I2782 Chronic pulmonary embolism: Secondary | ICD-10-CM

## 2019-02-07 DIAGNOSIS — I10 Essential (primary) hypertension: Secondary | ICD-10-CM | POA: Diagnosis not present

## 2019-02-07 DIAGNOSIS — K219 Gastro-esophageal reflux disease without esophagitis: Secondary | ICD-10-CM

## 2019-02-07 DIAGNOSIS — J449 Chronic obstructive pulmonary disease, unspecified: Secondary | ICD-10-CM

## 2019-02-07 DIAGNOSIS — I2699 Other pulmonary embolism without acute cor pulmonale: Secondary | ICD-10-CM

## 2019-02-07 DIAGNOSIS — Z7689 Persons encountering health services in other specified circumstances: Secondary | ICD-10-CM | POA: Diagnosis not present

## 2019-02-07 DIAGNOSIS — E039 Hypothyroidism, unspecified: Secondary | ICD-10-CM | POA: Diagnosis not present

## 2019-02-07 DIAGNOSIS — Z7901 Long term (current) use of anticoagulants: Secondary | ICD-10-CM | POA: Diagnosis not present

## 2019-02-07 NOTE — Progress Notes (Signed)
[place], [place] [address]   CLINIC:  Medical Oncology/Hematology  PCP:  Eston Esters, [place] [address] [phone]   REASON FOR VISIT:  Follow-up for Hx of PE  CURRENT THERAPY: Eliquis     INTERVAL HISTORY:  Ms. Severin 53 y.o. female presents today for follow up. Reports overall doing well. Denies any significant fatigue. She is currently on Eliquis for history of PE. She is tolerating Eliquis well. She denies any history of thrombus over the past year, since her last follow up. Denies any episodes of bleeding. Denies any CP, SOB, lightheadedness or dizziness.  Denies any extremity edema. She is here for repeat labs and office visit.    REVIEW OF SYSTEMS:  Review of Systems  Constitutional: Negative.   HENT:  Negative.   Eyes: Negative.   Respiratory: Negative.   Cardiovascular: Negative.   Gastrointestinal: Negative.   Endocrine: Negative.   Genitourinary: Negative.    Musculoskeletal: Negative.   Skin: Negative.   Neurological: Negative.   Hematological: Negative.   Psychiatric/Behavioral: Negative.      PAST MEDICAL/SURGICAL HISTORY:  Past Medical History:  Diagnosis Date  . Anemia   . Anxiety   . Arthritis   . Asthma   . Autonomic neuropathy   . Constipation   . COPD (chronic obstructive pulmonary disease) ([place])   . CTS (carpal tunnel syndrome)   . Depression   . Diabetes mellitus without complication ([place])   . Endometrial cancer ([place]) 2017  . GERD (gastroesophageal reflux disease)   . Headache(784.0)   . HTN (hypertension)   . Hypercholesterolemia   . Hypothyroidism   . IBS (irritable bowel syndrome)   . Osteoarthritis    osteoarthritis  . Pancreatitis    per patient   . Recurrent boils    buttocks and low back.  . Shortness of breath   . Sleep apnea    CPAP machine  . Uterine cancer ([place]) 2017  . Vitamin D deficiency    Past Surgical History:  Procedure  Laterality Date  . ABDOMINAL HYSTERECTOMY  2018   endometrial cancer, surgery done at [facility]   . BIOPSY  01/02/2017   Procedure: BIOPSY;  Surgeon: Danie Binder, MD;  Location: [facility];  Service: Endoscopy;;  gastric biopsy  . CESAREAN SECTION  1989  . COLONOSCOPY WITH PROPOFOL N/A 01/02/2017   Dr. Oneida Alar: redundant colon, two 2-3 mm polyps in sigmoid colon (hyperplastic)  . ESOPHAGOGASTRODUODENOSCOPY  05/22/2011   Dr. Oneida Alar: H.pylori gastritis   . ESOPHAGOGASTRODUODENOSCOPY (EGD) WITH PROPOFOL N/A 01/02/2017   Dr. 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 FOOT SURGERY Left    tendon repair  . OOPHORECTOMY    . POLYPECTOMY  01/02/2017   Procedure: POLYPECTOMY;  Surgeon: Danie Binder, MD;  Location: [facility];  Service: Endoscopy;;  sigmoid colon polyps times 2  . REDUCTION MAMMAPLASTY  1996  . SAVORY DILATION N/A 01/02/2017   Procedure: SAVORY DILATION;  Surgeon: Danie Binder, MD;  Location: [facility];  Service: Endoscopy;  Laterality: N/A;  . TUBAL LIGATION       SOCIAL HISTORY:  Social History   Socioeconomic History  . Marital status: Married    Spouse name: Not on file  . Number of children: 1  . Years of education: Not on file  . Highest education level: Not on file  Occupational History  .  Not on file  Social Needs  . Financial resource strain: Not on file  . Food insecurity:    Worry: Not on file    Inability: Not on file  . Transportation needs:    Medical: Not on file    Non-medical: Not on file  Tobacco Use  . Smoking status: Current Some Day Smoker    Packs/day: 0.50    Years: 22.00    Pack years: 11.00    Types: Cigarettes  . Smokeless tobacco: Never Used  Substance and Sexual Activity  . Alcohol use: No  . Drug use: No  . Sexual activity: Yes    Birth control/protection: Surgical    Comment: hyst  Lifestyle  . Physical activity:    Days per week: Not on file    Minutes  per session: Not on file  . Stress: Not on file  Relationships  . Social connections:    Talks on phone: Not on file    Gets together: Not on file    Attends religious service: Not on file    Active member of club or organization: Not on file    Attends meetings of clubs or organizations: Not on file    Relationship status: Not on file  . Intimate partner violence:    Fear of current or ex partner: Not on file    Emotionally abused: Not on file    Physically abused: Not on file    Forced sexual activity: Not on file  Other Topics Concern  . Not on file  Social History Narrative   Drinks coffee occasionally, soda 2-3 daily     FAMILY HISTORY:  Family History  Problem Relation Age of Onset  . Diabetes Mother   . Hypertension Mother   . COPD Mother   . Asthma Mother   . Hypercholesterolemia Mother   . Hypertension Sister   . Hyperlipidemia Sister   . Diabetes Sister   . Asthma Brother   . Alcohol abuse Brother   . Asthma Son   . Cancer Maternal Grandmother        lung, throat, breast  . Alzheimer's disease Maternal Grandmother   . Hypertension Maternal Grandmother   . Hypercholesterolemia Maternal Grandmother   . Heart disease Maternal Grandfather   . Stroke Maternal Grandfather   . Clotting disorder Maternal Grandfather        blood clots in legs  . Hypertension Sister   . Hyperlipidemia Sister   . Stroke Maternal Aunt   . Clotting disorder Maternal Aunt        blood clots in legs  . Hypertension Maternal Aunt   . Hypercholesterolemia Maternal Aunt   . Hypertension Maternal Aunt   . Asthma Maternal Aunt   . Clotting disorder Maternal Uncle        blood clot -legs traveled to heart and he died of heart attack  . Colon cancer Neg Hx     CURRENT MEDICATIONS:  Outpatient Encounter Medications as of 02/07/2019  Medication Sig  . albuterol (PROVENTIL HFA;VENTOLIN HFA) 108 (90 Base) MCG/ACT inhaler Inhale 2 puffs into the lungs every 6 (six) hours as needed for  wheezing or shortness of breath.  [name] albuterol (PROVENTIL) (2.5 MG/3ML) 0.083% nebulizer solution Take 3 mLs (2.5 mg total) by nebulization every 6 (six) hours as needed for wheezing.  [name] apixaban (ELIQUIS) 5 MG TABS tablet Take 2 tablets (10 mg total) by mouth 2 (two) times daily. On 08/12/17 @ 9PM, start 1 tab (5 mg)  two times daily. (Patient taking differently: Take 5 mg by mouth 2 (two) times daily. On 08/12/17 @ 9PM, start 1 tab (5 mg) two times daily.)  . atorvastatin (LIPITOR) 40 MG tablet Take 40 mg by mouth daily.  . cycloSPORINE (RESTASIS) 0.05 % ophthalmic emulsion Place 1 drop into both eyes daily.  [name] esomeprazole (NEXIUM) 40 MG capsule Take 1 capsule (40 mg total) by mouth 2 (two) times daily before a meal.  . furosemide (LASIX) 40 MG tablet Take 1 tablet (40 mg total) by mouth daily.  [name] gabapentin (NEURONTIN) 300 MG capsule Take 300 mg by mouth 3 (three) times daily.  [name] HYDROcodone-acetaminophen (NORCO/VICODIN) 5-325 MG tablet One tablet every six hours for pain.  Limit 7 days. (Patient not taking: Reported on 02/07/2019)  . hydrocortisone 1 % ointment Apply 1 application topically 2 (two) times daily.  . insulin NPH-regular Human (NOVOLIN 70/30 RELION) (70-30) 100 UNIT/ML injection Inject 50 units into skin three times daily (breakfast, supper, bedtime)  . JARDIANCE 10 MG TABS tablet Take 10 mg by mouth daily.  [name] linaclotide (LINZESS) 290 MCG CAPS capsule Take 1 capsule (290 mcg total) by mouth daily before breakfast.  . lisinopril (PRINIVIL,ZESTRIL) 20 MG tablet Take 1 tablet (20 mg total) by mouth daily.  . metFORMIN (GLUCOPHAGE) 1000 MG tablet Take 1 tablet (1,000 mg total) by mouth 2 (two) times daily with a meal.  . montelukast (SINGULAIR) 10 MG tablet Take 10 mg by mouth at bedtime.   . naproxen (NAPROSYN) 500 MG tablet Take 500 mg by mouth 2 (two) times daily with a meal.   . nystatin-triamcinolone ointment (MYCOLOG) Apply 1 application topically 2 (two) times daily. To affected  area.,use after drying tissue.  . ondansetron (ZOFRAN ODT) 4 MG disintegrating tablet Take 4 mg by mouth every 8 (eight) hours as needed for nausea or vomiting.   . potassium chloride (K-DUR) 10 MEQ tablet Take 10 mEq by mouth every evening.   . silver sulfADIAZINE (SILVADENE) 1 % cream Apply 1 application topically daily. (Patient taking differently: Apply 1 application topically as needed. )  . Vitamin D, Ergocalciferol, (DRISDOL) 50000 units CAPS capsule Take 50,000 Units by mouth every 7 (seven) days.  . [DISCONTINUED] predniSONE (STERAPRED UNI-PAK 21 TAB) 5 MG (21) TBPK tablet Take 6 pills first day; 5 pills second day; 4 pills third day; 3 pills fourth day; 2 pills next day and 1 pill last day. (Patient not taking: Reported on 02/07/2019)  . [DISCONTINUED] sitaGLIPtin (JANUVIA) 100 MG tablet Take 100 mg by mouth daily.   No facility-administered encounter medications on file as of 02/07/2019.     ALLERGIES:  No Known Allergies   PHYSICAL EXAM:  ECOG Performance status: 1  Vitals:   02/07/19 1139  BP: 123/73  Pulse: 80  Resp: 14  Temp: 98.1 F (36.7 C)  SpO2: 99%   Filed Weights   02/07/19 1139  Weight: 245 lb 12.8 oz (111.5 kg)    Physical Exam Constitutional:      Appearance: Normal appearance. She is obese.  HENT:     Head: Normocephalic.     Nose: Nose normal.     Mouth/Throat:     Mouth: Mucous membranes are moist.     Pharynx: Oropharynx is clear.  Eyes:     Conjunctiva/sclera: Conjunctivae normal.     Pupils: Pupils are equal, round, and reactive to light.  Neck:     Musculoskeletal: Normal range of motion.  Cardiovascular:     Rate  and Rhythm: Normal rate and regular rhythm.     Pulses: Normal pulses.  Pulmonary:     Effort: Pulmonary effort is normal.  Abdominal:     General: Abdomen is flat. Bowel sounds are normal.  Musculoskeletal: Normal range of motion.  Skin:    General: Skin is warm.  Neurological:     General: No focal deficit present.      Mental Status: She is alert. Mental status is at baseline.  Psychiatric:        Mood and Affect: Mood normal.        Behavior: Behavior normal.        Thought Content: Thought content normal.        Judgment: Judgment normal.      LABORATORY DATA:  I have reviewed the labs as listed.  CBC    Component Value Date/Time   WBC 9.2 08/05/2017 0812   RBC 4.33 08/05/2017 0812   HGB 13.0 08/05/2017 0812   HCT 41.0 08/05/2017 0812   PLT 251 08/05/2017 0812   MCV 94.7 08/05/2017 0812   MCH 30.0 08/05/2017 0812   MCHC 31.7 08/05/2017 0812   RDW 12.6 08/05/2017 0812   LYMPHSABS 2.6 08/03/2017 1930   MONOABS 0.5 08/03/2017 1930   EOSABS 0.2 08/03/2017 1930   BASOSABS 0.0 08/03/2017 1930   CMP Latest Ref Rng & Units 01/28/2018 08/06/2017 08/05/2017  Glucose 65 - 99 mg/dL - 272(H) 236(H)  BUN 6 - 20 mg/dL - 8 9  Creatinine 0.44 - 1.00 mg/dL 0.80 0.77 0.67  Sodium 135 - 145 mmol/L - 137 139  Potassium 3.5 - 5.1 mmol/L - 3.7 3.7  Chloride 101 - 111 mmol/L - 102 106  CO2 22 - 32 mmol/L - 28 26  Calcium 8.9 - 10.3 mg/dL - 9.6 9.7  Total Protein 6.5 - 8.1 g/dL - - -  Total Bilirubin 0.3 - 1.2 mg/dL - - -  Alkaline Phos 38 - 126 U/L - - -  AST 15 - 41 U/L - - -  ALT 14 - 54 U/L - - -       DIAGNOSTIC IMAGING:  I have independently reviewed the scans and discussed with the patient.   I have reviewed Carver Fila, NP's note and agree with the documentation.  I personally performed a face-to-face visit, made revisions and my assessment and plan is as follows.    ASSESSMENT & PLAN:   Pulmonary embolism ([place]) 1.  Unprovoked pulmonary embolism: - Diagnosed in November 2018, on Eliquis 5 mg twice daily, without any bleeding complications. -She did not have any risk factors for pulmonary embolism. - Her blood work was negative for lupus anticoagulant, anticardiolipin antibodies, anti-beta-2 glycoprotein 1 antibodies.  It was also negative for other tests like Antithrombin III, factor V  [place], [place] 20210A mutation, protein C and protein S deficiencies. -CT CAP in May 2019 was negative for any metastatic disease.  This was done because of her history of endometrial cancer.  As she had unprovoked pulmonary embolism, her chance of recurrent DVT is about 8% at year 1 and about 40% at year 5.  At this time the benefits of continued anticoagulation outweigh the risks.  Hence I have recommended indefinite anticoagulation.  Patient is agreeable to this option.   - D-Dimer from 01/31/19 was negative. She is tolerating Eliquis well. We will continue with anticoagulation therapy. She can return to clinic in 1 year or sooner if needed.  Orders placed this encounter:  Orders Placed This Encounter  Procedures  . CBC with Differential  . Comprehensive metabolic panel      Derek Jack, MD [place] [phone]

## 2019-02-07 NOTE — Assessment & Plan Note (Addendum)
1.  Unprovoked pulmonary embolism: - Diagnosed in November 2018, on Eliquis 5 mg twice daily, without any bleeding complications. -She did not have any risk factors for pulmonary embolism. - Her blood work was negative for lupus anticoagulant, anticardiolipin antibodies, anti-beta-2 glycoprotein 1 antibodies.  It was also negative for other tests like Antithrombin III, factor V [place], [place] 20210A mutation, protein C and protein S deficiencies. -CT CAP in May 2019 was negative for any metastatic disease.  This was done because of her history of endometrial cancer.  As she had unprovoked pulmonary embolism, her chance of recurrent DVT is about 8% at year 1 and about 40% at year 5.  At this time the benefits of continued anticoagulation outweigh the risks.  Hence I have recommended indefinite anticoagulation.  Patient is agreeable to this option.   - D-Dimer from 01/31/19 was negative. She is tolerating Eliquis well. We will continue with anticoagulation therapy. She can return to clinic in 1 year or sooner if needed.

## 2019-02-07 NOTE — Patient Instructions (Signed)
[facility]  Discharge Instructions:  You saw Dr. Katragadda today. _______________________________________________________________  Thank you for choosing [facility] to provide your oncology and hematology care.  To afford each patient quality time with our providers, please arrive at least 15 minutes before your scheduled appointment.  You need to re-schedule your appointment if you arrive 10 or more minutes late.  We strive to give you quality time with our providers, and arriving late affects you and other patients whose appointments are after yours.  Also, if you no show three or more times for appointments you may be dismissed from the clinic.  Again, thank you for choosing [facility]. Our hope is that these requests will allow you access to exceptional care and in a timely manner. _______________________________________________________________  If you have questions after your visit, please contact our office at [phone] between the hours of 8:30 a.m. and 5:00 p.m. Voicemails left after 4:30 p.m. will not be returned until the following business day. _______________________________________________________________  For prescription refill requests, have your pharmacy contact our office. _______________________________________________________________  Recommendations made by the consultant and any test results will be sent to your referring physician. _______________________________________________________________ 

## 2019-02-17 DIAGNOSIS — E1165 Type 2 diabetes mellitus with hyperglycemia: Secondary | ICD-10-CM | POA: Diagnosis not present

## 2019-02-17 DIAGNOSIS — J45909 Unspecified asthma, uncomplicated: Secondary | ICD-10-CM | POA: Diagnosis not present

## 2019-02-17 DIAGNOSIS — K219 Gastro-esophageal reflux disease without esophagitis: Secondary | ICD-10-CM | POA: Diagnosis not present

## 2019-02-17 DIAGNOSIS — I10 Essential (primary) hypertension: Secondary | ICD-10-CM | POA: Diagnosis not present

## 2019-02-19 ENCOUNTER — Telehealth: Payer: Self-pay | Admitting: Orthopaedic Surgery

## 2019-02-19 MED ORDER — HYDROCODONE-ACETAMINOPHEN 5-325 MG PO TABS: ORAL_TABLET | ORAL | 0 refills | Status: DC

## 2019-02-19 NOTE — Telephone Encounter (Signed)
Patient requests refill on Hydrocodone/Acetaminophen 5-325  Mgs.   Qty  25  Sig: One tablet every six hours for pain. Limit 7 days.  Patient states she uses [facility] in [place]

## 2019-02-25 ENCOUNTER — Other Ambulatory Visit: Payer: Self-pay

## 2019-02-25 ENCOUNTER — Ambulatory Visit: Payer: Medicaid Other | Admitting: Orthopaedic Surgery

## 2019-02-25 ENCOUNTER — Encounter: Payer: Self-pay | Admitting: Orthopaedic Surgery

## 2019-02-25 VITALS — Temp 97.3°F | Ht 65.0 in | Wt 253.0 lb

## 2019-02-25 DIAGNOSIS — M25562 Pain in left knee: Secondary | ICD-10-CM

## 2019-02-25 DIAGNOSIS — G8929 Other chronic pain: Secondary | ICD-10-CM | POA: Diagnosis not present

## 2019-02-25 DIAGNOSIS — M25561 Pain in right knee: Secondary | ICD-10-CM

## 2019-02-25 DIAGNOSIS — Z6841 Body Mass Index (BMI) 40.0 and over, adult: Secondary | ICD-10-CM

## 2019-02-25 DIAGNOSIS — Z7689 Persons encountering health services in other specified circumstances: Secondary | ICD-10-CM | POA: Diagnosis not present

## 2019-02-25 MED ORDER — HYDROCODONE-ACETAMINOPHEN 5-325 MG PO TABS: ORAL_TABLET | ORAL | 0 refills | Status: DC

## 2019-02-25 NOTE — Progress Notes (Signed)
CC: Both of my knees are hurting. I would like an injection in both knees.  The patient has had chronic pain and tenderness of both knees for some time.  Injections help.  There is no locking or giving way of the knee.  There is no new trauma. There is no redness or signs of infections.  The knees have a mild effusion and some crepitus.  There is no redness or signs of recent trauma.  Right knee ROM is 0-100 and left knee ROM is 0-105.  Impression:  Chronic pain of the both knees  Return:  as needed  PROCEDURE NOTE:  The patient requests injections of both knees, verbal consent was obtained.  The left and right knee were individually prepped appropriately after time out was performed.   Sterile technique was observed and injection of 1 cc of Depo-Medrol 40 mg with several cc's of plain xylocaine. Anesthesia was provided by ethyl chloride and a 20-gauge needle was used to inject each knee area. The injections were tolerated well.  A band aid dressing was applied.  The patient was advised to apply ice later today and tomorrow to the injection sight as needed.   I have reviewed the [place] web site prior to prescribing narcotic medicine for this patient.   Electronically Signed Sanjuana Kava, MD 6/16/20209:04 AM

## 2019-03-13 DIAGNOSIS — Z0271 Encounter for disability determination: Secondary | ICD-10-CM

## 2019-03-17 ENCOUNTER — Other Ambulatory Visit: Payer: Self-pay

## 2019-03-17 ENCOUNTER — Encounter (HOSPITAL_COMMUNITY): Payer: Self-pay

## 2019-03-17 ENCOUNTER — Emergency Department (HOSPITAL_COMMUNITY)
Admission: EM | Admit: 2019-03-17 | Discharge: 2019-03-17 | Disposition: A | Discharge status: Home / Self Care | Payer: Medicaid Other | Attending: Emergency Medicine | Admitting: Emergency Medicine

## 2019-03-17 DIAGNOSIS — Z7901 Long term (current) use of anticoagulants: Secondary | ICD-10-CM | POA: Diagnosis not present

## 2019-03-17 DIAGNOSIS — E119 Type 2 diabetes mellitus without complications: Secondary | ICD-10-CM | POA: Insufficient documentation

## 2019-03-17 DIAGNOSIS — J449 Chronic obstructive pulmonary disease, unspecified: Secondary | ICD-10-CM | POA: Diagnosis not present

## 2019-03-17 DIAGNOSIS — Z7984 Long term (current) use of oral hypoglycemic drugs: Secondary | ICD-10-CM | POA: Insufficient documentation

## 2019-03-17 DIAGNOSIS — J45909 Unspecified asthma, uncomplicated: Secondary | ICD-10-CM | POA: Insufficient documentation

## 2019-03-17 DIAGNOSIS — L02416 Cutaneous abscess of left lower limb: Secondary | ICD-10-CM | POA: Insufficient documentation

## 2019-03-17 DIAGNOSIS — L02419 Cutaneous abscess of limb, unspecified: Secondary | ICD-10-CM

## 2019-03-17 DIAGNOSIS — F17210 Nicotine dependence, cigarettes, uncomplicated: Secondary | ICD-10-CM | POA: Diagnosis not present

## 2019-03-17 DIAGNOSIS — E039 Hypothyroidism, unspecified: Secondary | ICD-10-CM | POA: Diagnosis not present

## 2019-03-17 LAB — CBG MONITORING, ED: Glucose-Capillary: 252 mg/dL — ABNORMAL HIGH (ref 70–99)

## 2019-03-17 MED ORDER — FLUCONAZOLE 200 MG PO TABS: 200.0000 mg | ORAL_TABLET | Freq: Every day | ORAL | 0 refills | Status: AC

## 2019-03-17 MED ORDER — LIDOCAINE HCL (PF) 2 % IJ SOLN: 5.0000 mL | Freq: Once | INTRAMUSCULAR | Status: DC

## 2019-03-17 MED ORDER — POVIDONE-IODINE 10 % EX SOLN
CUTANEOUS | Status: DC | PRN
  Administered 2019-03-17: 12:00:00 via TOPICAL
  Filled 2019-03-17 (×2): qty 15

## 2019-03-17 MED ORDER — LIDOCAINE-EPINEPHRINE (PF) 2 %-1:200000 IJ SOLN
INTRAMUSCULAR | Status: AC
  Filled 2019-03-17: qty 20

## 2019-03-17 MED ORDER — DOXYCYCLINE HYCLATE 100 MG PO CAPS: 100.0000 mg | ORAL_CAPSULE | Freq: Two times a day (BID) | ORAL | 0 refills | Status: DC

## 2019-03-17 MED ORDER — HYDROCODONE-ACETAMINOPHEN 5-325 MG PO TABS: ORAL_TABLET | ORAL | 0 refills | Status: DC

## 2019-03-17 MED ORDER — OXYCODONE-ACETAMINOPHEN 5-325 MG PO TABS
1.0000 | ORAL_TABLET | Freq: Once | ORAL | Status: AC
  Administered 2019-03-17: 1 via ORAL
  Filled 2019-03-17: qty 1

## 2019-03-17 NOTE — ED Notes (Signed)
Non stick telfa dressing applied.

## 2019-03-17 NOTE — Discharge Instructions (Addendum)
Warm water compresses or soaks 2-3 times a day.  Take the antibiotic as directed until its finished.  The packing will need to be removed in 2 days.  Return to ER for any worsening symptoms

## 2019-03-17 NOTE — ED Triage Notes (Signed)
Pt presents to ED with c/o abscess to the back of her left thigh x 2 weeks. Pt denies fever

## 2019-03-17 NOTE — ED Provider Notes (Signed)
[facility] EMERGENCY DEPARTMENT Provider Note   CSN: [id number] Arrival date & time: 03/17/19  1049     History   Chief Complaint Chief Complaint  Patient presents with  . Abscess    HPI Sonya James is a 53 y.o. female.     HPI  Sonya James is a 53 y.o. female who presents to the Emergency Department complaining of pain, swelling and intermittent drainage of a localized area to the posterior left thigh.  Symptoms have been present for 2 days.  She reports recurrent abscesses and current symptoms feel similar.  She notes having yellow to bloody drainage for two episodes, but continues to have pain to the area.  She denies fever, chills, abdominal pain and pain radiating down her leg.    Past Medical History:  Diagnosis Date  . Anemia   . Anxiety   . Arthritis   . Asthma   . Autonomic neuropathy   . Constipation   . COPD (chronic obstructive pulmonary disease) ([place])   . CTS (carpal tunnel syndrome)   . Depression   . Diabetes mellitus without complication ([place])   . Endometrial cancer ([place]) 2017  . GERD (gastroesophageal reflux disease)   . Headache(784.0)   . HTN (hypertension)   . Hypercholesterolemia   . Hypothyroidism   . IBS (irritable bowel syndrome)   . Osteoarthritis    osteoarthritis  . Pancreatitis    per patient   . Recurrent boils    buttocks and low back.  . Shortness of breath   . Sleep apnea    CPAP machine  . Uterine cancer ([place]) 2017  . Vitamin D deficiency     Patient Active Problem List   Diagnosis Date Noted  . Multiple pulmonary emboli ([place])   . Uncontrolled type 2 diabetes mellitus with hyperglycemia, with long-term current use of insulin ([place]) 08/04/2017  . Uncontrolled type 2 diabetes mellitus with diabetic polyneuropathy, with long-term current use of insulin ([place]) 08/04/2017  . Hypercalcemia 08/04/2017  . Obesity, Class III, BMI 40-49.9 (morbid obesity) ([place]) 08/04/2017  . Hypokalemia 08/04/2017  . Pulmonary embolism ([place])  08/03/2017  . Sleep apnea 08/03/2017  . Encounter for screening colonoscopy   . Gastritis without bleeding   . Dysphagia 12/14/2016  . S/P laparoscopic hysterectomy 11/04/2015  . Endometrial cancer ([place]) 10/25/2015  . Diabetes mellitus with neuropathy ([place]) 10/25/2015  . Cancer of endometrium ([place]) 10/18/2015  . Heavy menses 10/11/2015  . COPD exacerbation ([place]) 07/04/2014  . Acute bronchitis 05/25/2013  . Fever 05/25/2013  . Asthma 05/25/2013  . COPD (chronic obstructive pulmonary disease) ([place]) 05/25/2013  . Tobacco abuse 05/25/2013  . Obesity 05/25/2013  . HTN (hypertension), benign 05/25/2013  . Hypercholesteremia 05/25/2013  . Pedal edema 05/25/2013  . Thyromegaly 05/25/2013  . Abdominal pain 04/11/2011  . Constipation 04/11/2011  . GERD (gastroesophageal reflux disease) 04/11/2011    Past Surgical History:  Procedure Laterality Date  . ABDOMINAL HYSTERECTOMY  2018   endometrial cancer, surgery done at [facility]   . BIOPSY  01/02/2017   Procedure: BIOPSY;  Surgeon: Danie Binder, MD;  Location: [facility] END[facility];  Service: Endoscopy;;  gastric biopsy  . CESAREAN SECTION  1989  . COLONOSCOPY WITH PROPOFOL N/A 01/02/2017   Dr. Oneida Alar: redundant colon, two 2-3 mm polyps in sigmoid colon (hyperplastic)  . ESOPHAGOGASTRODUODENOSCOPY  05/22/2011   Dr. Oneida Alar: H.pylori gastritis   . ESOPHAGOGASTRODUODENOSCOPY (EGD) WITH PROPOFOL N/A 01/02/2017   Dr. 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  s/p dilation, moderate gastritis (negative H.pylori)  . FOOT SURGERY Left    tendon repair  . OOPHORECTOMY    . POLYPECTOMY  01/02/2017   Procedure: POLYPECTOMY;  Surgeon: Danie Binder, MD;  Location: [facility] ENDO [facility];  Service: Endoscopy;;  sigmoid colon polyps times 2  . REDUCTION MAMMAPLASTY  1996  . SAVORY DILATION N/A 01/02/2017   Procedure: SAVORY DILATION;  Surgeon: Danie Binder, MD;  Location: [facility] ENDO [facility];  Service: Endoscopy;  Laterality: N/A;  . TUBAL LIGATION       OB  History    Gravida  1   Para  1   Term  1   Preterm      AB      Living  1     SAB      TAB      Ectopic      Multiple      Live Births  1            Home Medications    Prior to Admission medications   Medication Sig Start Date End Date Taking? Authorizing Provider  albuterol (PROVENTIL HFA;VENTOLIN HFA) 108 (90 Base) MCG/ACT inhaler Inhale 2 puffs into the lungs every 6 (six) hours as needed for wheezing or shortness of breath.   Yes [provider]  albuterol (PROVENTIL) (2.5 MG/3ML) 0.083% nebulizer solution Take 3 mLs (2.5 mg total) by nebulization every 6 (six) hours as needed for wheezing. 07/05/14  Yes Kathie Dike, MD  apixaban (ELIQUIS) 5 MG TABS tablet Take 2 tablets (10 mg total) by mouth 2 (two) times daily. On 08/12/17 @ 9PM, start 1 tab (5 mg) two times daily. Patient taking differently: Take 5 mg by mouth 2 (two) times daily. On 08/12/17 @ 9PM, start 1 tab (5 mg) two times daily. 08/06/17  Yes Tat, Shanon Brow, MD  atorvastatin (LIPITOR) 40 MG tablet Take 40 mg by mouth daily.   Yes [provider]  esomeprazole (NEXIUM) 40 MG capsule Take 1 capsule (40 mg total) by mouth 2 (two) times daily before a meal. 01/02/17  Yes Fields, [name] L, MD  furosemide (LASIX) 40 MG tablet Take 1 tablet (40 mg total) by mouth daily. 05/27/13  Yes Kathie Dike, MD  gabapentin (NEURONTIN) 300 MG capsule Take 300 mg by mouth 3 (three) times daily.   Yes [provider]  HYDROcodone-acetaminophen (NORCO/VICODIN) 5-325 MG tablet One tablet every six hours for pain.  Limit 7 days. 02/25/19  Yes Sanjuana Kava, MD  insulin NPH-regular Human (NOVOLIN 70/30 RELION) (70-30) 100 UNIT/ML injection Inject 50 units into skin three times daily (breakfast, supper, bedtime) 08/06/17  Yes Tat, David, MD  JARDIANCE 10 MG TABS tablet Take 10 mg by mouth daily. 01/29/19  Yes [provider]  linaclotide Rolan Lipa) 290 MCG CAPS capsule Take 1 capsule (290 mcg  total) by mouth daily before breakfast. 05/09/17  Yes Annitta Needs, NP  lisinopril (PRINIVIL,ZESTRIL) 20 MG tablet Take 1 tablet (20 mg total) by mouth daily. 07/05/14  Yes Kathie Dike, MD  metFORMIN (GLUCOPHAGE) 1000 MG tablet Take 1 tablet (1,000 mg total) by mouth 2 (two) times daily with a meal. 07/05/14  Yes Memon, Jolaine Artist, MD  montelukast (SINGULAIR) 10 MG tablet Take 10 mg by mouth at bedtime.    Yes [provider]  naproxen (NAPROSYN) 500 MG tablet Take 500 mg by mouth 2 (two) times daily with a meal.  12/11/17  Yes [provider]  ondansetron (ZOFRAN ODT) 4 MG  disintegrating tablet Take 4 mg by mouth every 8 (eight) hours as needed for nausea or vomiting.  10/11/15  Yes [provider]  potassium chloride (K-DUR) 10 MEQ tablet Take 10 mEq by mouth every evening.  07/05/14  Yes [provider]  silver sulfADIAZINE (SILVADENE) 1 % cream Apply 1 application topically daily. Patient taking differently: Apply 1 application topically as needed.  07/03/18  Yes Jonnie Kind, MD  Vitamin D, Ergocalciferol, (DRISDOL) 50000 units CAPS capsule Take 50,000 Units by mouth every 7 (seven) days.   Yes [provider]  cycloSPORINE (RESTASIS) 0.05 % ophthalmic emulsion Place 1 drop into both eyes daily.    [provider]  hydrocortisone 1 % ointment Apply 1 application topically 2 (two) times daily. Patient not taking: Reported on 03/17/2019 12/13/16   Jonnie Kind, MD  nystatin-triamcinolone ointment [facility]) Apply 1 application topically 2 (two) times daily. To affected area.,use after drying tissue. Patient not taking: Reported on 03/17/2019 04/25/18   Jonnie Kind, MD    Family History Family History  Problem Relation Age of Onset  . Diabetes Mother   . Hypertension Mother   . COPD Mother   . Asthma Mother   . Hypercholesterolemia Mother   . Hypertension Sister   . Hyperlipidemia Sister   . Diabetes Sister   . Asthma Brother    . Alcohol abuse Brother   . Asthma Son   . Cancer Maternal Grandmother        lung, throat, breast  . Alzheimer's disease Maternal Grandmother   . Hypertension Maternal Grandmother   . Hypercholesterolemia Maternal Grandmother   . Heart disease Maternal Grandfather   . Stroke Maternal Grandfather   . Clotting disorder Maternal Grandfather        blood clots in legs  . Hypertension Sister   . Hyperlipidemia Sister   . Stroke Maternal Aunt   . Clotting disorder Maternal Aunt        blood clots in legs  . Hypertension Maternal Aunt   . Hypercholesterolemia Maternal Aunt   . Hypertension Maternal Aunt   . Asthma Maternal Aunt   . Clotting disorder Maternal Uncle        blood clot -legs traveled to heart and he died of heart attack  . Colon cancer Neg Hx     Social History Social History   Tobacco Use  . Smoking status: Current Some Day Smoker    Packs/day: 0.50    Years: 22.00    Pack years: 11.00    Types: Cigarettes  . Smokeless tobacco: Never Used  Substance Use Topics  . Alcohol use: No  . Drug use: No     Allergies   Patient has no known allergies.   Review of Systems Review of Systems  Constitutional: Negative for chills and fever.  Respiratory: Negative for shortness of breath.   Cardiovascular: Negative for chest pain.  Gastrointestinal: Negative for abdominal pain, nausea and vomiting.  Musculoskeletal: Negative for arthralgias and joint swelling.  Skin: Positive for color change.       Redness, pain to left posterior thigh  Neurological: Negative for weakness and numbness.  Hematological: Negative for adenopathy.     Physical Exam Updated Vital Signs BP 134/83 (BP Location: Left Arm)   Pulse 96   Temp 99.6 F (37.6 C) (Oral)   Resp 18   Ht 5\' 5"  (1.651 m)   Wt 111.1 kg   LMP 09/20/2015 (Exact Date)   SpO2 98%  BMI 40.77 kg/m   Physical Exam Vitals signs and nursing note reviewed.  Constitutional:      General: She is not in acute  distress.    Appearance: Normal appearance. She is well-developed. She is not ill-appearing.  HENT:     Mouth/Throat:     Mouth: Mucous membranes are moist.  Cardiovascular:     Rate and Rhythm: Normal rate and regular rhythm.     Heart sounds: No murmur.  Pulmonary:     Effort: Pulmonary effort is normal. No respiratory distress.     Breath sounds: Normal breath sounds.  Skin:    General: Skin is warm.     Findings: Erythema present.     Comments: Localized, 5 cm area of erythema and induration to the upper posterior left thigh.  There is a small, central area of fluctuance.  No active drainage or lymphangitis.   Neurological:     General: No focal deficit present.     Mental Status: She is alert.     Sensory: No sensory deficit.     Motor: No weakness or abnormal muscle tone.      ED Treatments / Results  Labs (all labs ordered are listed, but only abnormal results are displayed) Labs Reviewed  CBG MONITORING, ED - Abnormal; Notable for the following components:      Result Value   Glucose-Capillary 252 (*)    All other components within normal limits    EKG None  Radiology No results found.  Procedures Procedures (including critical care time)  INCISION AND DRAINAGE Performed by: Peniel Hass Consent: Verbal consent obtained. Risks and benefits: risks, benefits and alternatives were discussed Type: abscess  Body area: left upper thigh  Anesthesia: local infiltration  Incision was made with a #11 scalpel.  Local anesthetic: lidocaine 2 % w/ epinephrine  Anesthetic total: 3 ml  Complexity: complex Blunt dissection to break up loculations  Drainage: purulent  Drainage amount: moderate  Packing material: 1/4 in iodoform gauze  Patient tolerance: Patient tolerated the procedure well with no immediate complications.     Medications Ordered in ED Medications  povidone-iodine (BETADINE) 10 % external solution ( Topical Given 03/17/19 1200)   lidocaine (XYLOCAINE) 2 % injection 5 mL (has no administration in time range)  lidocaine-EPINEPHrine (XYLOCAINE W/EPI) 2 %-1:200000 (PF) injection (has no administration in time range)  oxyCODONE-acetaminophen (PERCOCET/ROXICET) 5-325 MG per tablet 1 tablet (1 tablet Oral Given 03/17/19 1200)     Initial Impression / Assessment and Plan / ED Course  I have reviewed the triage vital signs and the nursing notes.  Pertinent labs & imaging results that were available during my care of the patient were reviewed by me and considered in my medical decision making (see chart for details).        CBG 252   Patient with recurrent abscesses.  She is well-appearing and nontoxic.  47-week old abscess that has been intermittently draining.  Successful I&D today.  Patient agrees to care instructions, antibiotics, warm soaks and close follow-up PCP.  Return precautions were discussed.  Final Clinical Impressions(s) / ED Diagnoses   Final diagnoses:  Abscess of thigh    ED Discharge Orders    None       Kem Parkinson, PA-C 03/18/19 2055    Francine Graven, DO 03/20/19 [phone]

## 2019-03-24 ENCOUNTER — Telehealth: Payer: Self-pay

## 2019-03-24 NOTE — Telephone Encounter (Signed)
Hydrocodone-Acetaminophen  5/325 mg  Qty 25 Tablets   PATIENT USES [facility]

## 2019-03-25 NOTE — Telephone Encounter (Signed)
She got pain medicine last week in ER.  She will have to wait a week.

## 2019-03-25 NOTE — Telephone Encounter (Signed)
Spoke with patient and relayed that she would have to wait a week for her pain medication. She stated that was ok.

## 2019-04-08 ENCOUNTER — Telehealth: Payer: Self-pay

## 2019-04-08 MED ORDER — HYDROCODONE-ACETAMINOPHEN 5-325 MG PO TABS: 1.0000 | ORAL_TABLET | Freq: Four times a day (QID) | ORAL | 0 refills | Status: AC | PRN

## 2019-04-08 NOTE — Telephone Encounter (Signed)
Hydrocodone-Acetaminophen 5/325mg   Qty 25 Tablets  PATIENT USES [facility]

## 2019-04-22 ENCOUNTER — Other Ambulatory Visit: Payer: Self-pay

## 2019-04-22 ENCOUNTER — Ambulatory Visit: Payer: Medicaid Other | Admitting: Orthopaedic Surgery

## 2019-04-22 ENCOUNTER — Encounter: Payer: Self-pay | Admitting: Orthopaedic Surgery

## 2019-04-22 VITALS — Temp 98.6°F | Ht 63.0 in | Wt 252.0 lb

## 2019-04-22 DIAGNOSIS — G8929 Other chronic pain: Secondary | ICD-10-CM

## 2019-04-22 DIAGNOSIS — M25561 Pain in right knee: Secondary | ICD-10-CM | POA: Diagnosis not present

## 2019-04-22 DIAGNOSIS — M25562 Pain in left knee: Secondary | ICD-10-CM

## 2019-04-22 DIAGNOSIS — Z6841 Body Mass Index (BMI) 40.0 and over, adult: Secondary | ICD-10-CM

## 2019-04-22 DIAGNOSIS — F17210 Nicotine dependence, cigarettes, uncomplicated: Secondary | ICD-10-CM

## 2019-04-22 DIAGNOSIS — Z7689 Persons encountering health services in other specified circumstances: Secondary | ICD-10-CM | POA: Diagnosis not present

## 2019-04-22 MED ORDER — HYDROCODONE-ACETAMINOPHEN 5-325 MG PO TABS: ORAL_TABLET | ORAL | 0 refills | Status: DC

## 2019-04-22 NOTE — Progress Notes (Signed)
CC: Both of my knees are hurting. I would like an injection in both knees.  The patient has had chronic pain and tenderness of both knees for some time.  Injections help.  There is no locking or giving way of the knee.  There is no new trauma. There is no redness or signs of infections.  The knees have a mild effusion and some crepitus.  There is no redness or signs of recent trauma.  Right knee ROM is 0-110 and left knee ROM is 0-105.  Impression:  Chronic pain of the both knees  Return:  2 months  PROCEDURE NOTE:  The patient requests injections of both knees, verbal consent was obtained.  The left and right knee were individually prepped appropriately after time out was performed.   Sterile technique was observed and injection of 1 cc of Depo-Medrol 40 mg with several cc's of plain xylocaine. Anesthesia was provided by ethyl chloride and a 20-gauge needle was used to inject each knee area. The injections were tolerated well.  A band aid dressing was applied.  The patient was advised to apply ice later today and tomorrow to the injection sight as needed.   I have reviewed the [place] web site prior to prescribing narcotic medicine for this patient.   Electronically Signed Sanjuana Kava, MD 8/11/202010:27 AM

## 2019-04-24 DIAGNOSIS — Z7689 Persons encountering health services in other specified circumstances: Secondary | ICD-10-CM | POA: Diagnosis not present

## 2019-04-24 DIAGNOSIS — Z79899 Other long term (current) drug therapy: Secondary | ICD-10-CM | POA: Diagnosis not present

## 2019-04-24 DIAGNOSIS — Z794 Long term (current) use of insulin: Secondary | ICD-10-CM | POA: Diagnosis not present

## 2019-04-24 DIAGNOSIS — E1165 Type 2 diabetes mellitus with hyperglycemia: Secondary | ICD-10-CM | POA: Diagnosis not present

## 2019-04-24 DIAGNOSIS — Z6841 Body Mass Index (BMI) 40.0 and over, adult: Secondary | ICD-10-CM | POA: Diagnosis not present

## 2019-05-02 ENCOUNTER — Encounter (HOSPITAL_COMMUNITY): Payer: Self-pay | Admitting: Emergency Medicine

## 2019-05-02 ENCOUNTER — Other Ambulatory Visit: Payer: Self-pay

## 2019-05-02 ENCOUNTER — Emergency Department (HOSPITAL_COMMUNITY)
Admission: EM | Admit: 2019-05-02 | Discharge: 2019-05-02 | Disposition: A | Discharge status: Home / Self Care | Payer: Medicaid Other | Attending: Emergency Medicine | Admitting: Emergency Medicine

## 2019-05-02 DIAGNOSIS — E119 Type 2 diabetes mellitus without complications: Secondary | ICD-10-CM | POA: Diagnosis not present

## 2019-05-02 DIAGNOSIS — Z7901 Long term (current) use of anticoagulants: Secondary | ICD-10-CM | POA: Insufficient documentation

## 2019-05-02 DIAGNOSIS — E039 Hypothyroidism, unspecified: Secondary | ICD-10-CM | POA: Insufficient documentation

## 2019-05-02 DIAGNOSIS — Z794 Long term (current) use of insulin: Secondary | ICD-10-CM | POA: Insufficient documentation

## 2019-05-02 DIAGNOSIS — R2241 Localized swelling, mass and lump, right lower limb: Secondary | ICD-10-CM | POA: Diagnosis present

## 2019-05-02 DIAGNOSIS — I10 Essential (primary) hypertension: Secondary | ICD-10-CM | POA: Diagnosis not present

## 2019-05-02 DIAGNOSIS — J449 Chronic obstructive pulmonary disease, unspecified: Secondary | ICD-10-CM | POA: Diagnosis not present

## 2019-05-02 DIAGNOSIS — F17210 Nicotine dependence, cigarettes, uncomplicated: Secondary | ICD-10-CM | POA: Diagnosis not present

## 2019-05-02 DIAGNOSIS — L02415 Cutaneous abscess of right lower limb: Secondary | ICD-10-CM | POA: Insufficient documentation

## 2019-05-02 DIAGNOSIS — Z79899 Other long term (current) drug therapy: Secondary | ICD-10-CM | POA: Diagnosis not present

## 2019-05-02 MED ORDER — DOXYCYCLINE HYCLATE 100 MG PO TABS
100.0000 mg | ORAL_TABLET | Freq: Once | ORAL | Status: AC
  Administered 2019-05-02: 100 mg via ORAL
  Filled 2019-05-02: qty 1

## 2019-05-02 MED ORDER — OXYCODONE-ACETAMINOPHEN 5-325 MG PO TABS
1.0000 | ORAL_TABLET | Freq: Once | ORAL | Status: AC
  Administered 2019-05-02: 1 via ORAL
  Filled 2019-05-02: qty 1

## 2019-05-02 MED ORDER — OXYCODONE-ACETAMINOPHEN 5-325 MG PO TABS: 1.0000 | ORAL_TABLET | Freq: Four times a day (QID) | ORAL | 0 refills | Status: DC | PRN

## 2019-05-02 MED ORDER — BUPIVACAINE HCL (PF) 0.25 % IJ SOLN
20.0000 mL | Freq: Once | INTRAMUSCULAR | Status: DC
  Filled 2019-05-02: qty 30

## 2019-05-02 MED ORDER — DOXYCYCLINE HYCLATE 100 MG PO CAPS: 100.0000 mg | ORAL_CAPSULE | Freq: Two times a day (BID) | ORAL | 0 refills | Status: DC

## 2019-05-02 MED ORDER — PROMETHAZINE HCL 12.5 MG PO TABS
12.5000 mg | ORAL_TABLET | Freq: Once | ORAL | Status: AC
  Administered 2019-05-02: 12.5 mg via ORAL
  Filled 2019-05-02: qty 1

## 2019-05-02 NOTE — Discharge Instructions (Signed)
Please soak in a warm Epson salt bath for 10 to 15 minutes daily until the wound heals from the inside out.  Please change dressing daily.  Please use doxycycline 2 times daily with food.  Use Tylenol every 4 hours for mild pain.  Use Percocet for more severe pain.  See Ms. Chana Bode, nurse practitioner, or return to the emergency department if any worsening of symptoms, changes in your condition, problems, or concerns.

## 2019-05-02 NOTE — ED Triage Notes (Signed)
Pt c/o abscess to posterior RT leg x 1 week.

## 2019-05-02 NOTE — ED Provider Notes (Signed)
[facility] EMERGENCY DEPARTMENT Provider Note   CSN: [id number] Arrival date & time: 05/02/19  1032     History   Chief Complaint Chief Complaint  Patient presents with  . Abscess    HPI Sonya James is a 53 y.o. female.     Patient is a 53 year old female who presents to the emergency department with a complaint of an abscess to the right leg.  The patient states that approximately a week ago this started as a very small bump.  She says that she is tried conservative measures including warm tub soaks.  But now it has gotten quite large and is extremely painful.  She says is hard for her to sit because of the pain of the right thigh.  This abscess area is on the back of her thigh.  She says she had a similar area on her left thigh about a month ago.  She has not had fever or chills.  No nausea vomiting.  The history is provided by the patient.  Abscess Associated symptoms: no nausea and no vomiting     Past Medical History:  Diagnosis Date  . Anemia   . Anxiety   . Arthritis   . Asthma   . Autonomic neuropathy   . Constipation   . COPD (chronic obstructive pulmonary disease) ([place])   . CTS (carpal tunnel syndrome)   . Depression   . Diabetes mellitus without complication ([place])   . Endometrial cancer ([place]) 2017  . GERD (gastroesophageal reflux disease)   . Headache(784.0)   . HTN (hypertension)   . Hypercholesterolemia   . Hypothyroidism   . IBS (irritable bowel syndrome)   . Osteoarthritis    osteoarthritis  . Pancreatitis    per patient   . Recurrent boils    buttocks and low back.  . Shortness of breath   . Sleep apnea    CPAP machine  . Uterine cancer ([place]) 2017  . Vitamin D deficiency     Patient Active Problem List   Diagnosis Date Noted  . Multiple pulmonary emboli ([place])   . Uncontrolled type 2 diabetes mellitus with hyperglycemia, with long-term current use of insulin ([place]) 08/04/2017  . Uncontrolled type 2 diabetes mellitus with diabetic  polyneuropathy, with long-term current use of insulin ([place]) 08/04/2017  . Hypercalcemia 08/04/2017  . Obesity, Class III, BMI 40-49.9 (morbid obesity) ([place]) 08/04/2017  . Hypokalemia 08/04/2017  . Pulmonary embolism ([place]) 08/03/2017  . Sleep apnea 08/03/2017  . Encounter for screening colonoscopy   . Gastritis without bleeding   . Dysphagia 12/14/2016  . S/P laparoscopic hysterectomy 11/04/2015  . Endometrial cancer ([place]) 10/25/2015  . Diabetes mellitus with neuropathy ([place]) 10/25/2015  . Cancer of endometrium ([place]) 10/18/2015  . Heavy menses 10/11/2015  . COPD exacerbation ([place]) 07/04/2014  . Acute bronchitis 05/25/2013  . Fever 05/25/2013  . Asthma 05/25/2013  . COPD (chronic obstructive pulmonary disease) ([place]) 05/25/2013  . Tobacco abuse 05/25/2013  . Obesity 05/25/2013  . HTN (hypertension), benign 05/25/2013  . Hypercholesteremia 05/25/2013  . Pedal edema 05/25/2013  . Thyromegaly 05/25/2013  . Abdominal pain 04/11/2011  . Constipation 04/11/2011  . GERD (gastroesophageal reflux disease) 04/11/2011    Past Surgical History:  Procedure Laterality Date  . ABDOMINAL HYSTERECTOMY  2018   endometrial cancer, surgery done at [facility]   . BIOPSY  01/02/2017   Procedure: BIOPSY;  Surgeon: Danie Binder, MD;  Location: [facility];  Service: Endoscopy;;  gastric biopsy  .  CESAREAN SECTION  1989  . COLONOSCOPY WITH PROPOFOL N/A 01/02/2017   Dr. Oneida Alar: redundant colon, two 2-3 mm polyps in sigmoid colon (hyperplastic)  . ESOPHAGOGASTRODUODENOSCOPY  05/22/2011   Dr. Oneida Alar: H.pylori gastritis   . ESOPHAGOGASTRODUODENOSCOPY (EGD) WITH PROPOFOL N/A 01/02/2017   Dr. 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 FOOT SURGERY Left    tendon repair  . OOPHORECTOMY    . POLYPECTOMY  01/02/2017   Procedure: POLYPECTOMY;  Surgeon: Danie Binder, MD;  Location: [facility];  Service: Endoscopy;;  sigmoid colon polyps times 2  .  REDUCTION MAMMAPLASTY  1996  . SAVORY DILATION N/A 01/02/2017   Procedure: SAVORY DILATION;  Surgeon: Danie Binder, MD;  Location: [facility];  Service: Endoscopy;  Laterality: N/A;  . TUBAL LIGATION       OB History    Gravida  1   Para  1   Term  1   Preterm      AB      Living  1     SAB      TAB      Ectopic      Multiple      Live Births  1            Home Medications    Prior to Admission medications   Medication Sig Start Date End Date Taking? Authorizing Provider  albuterol (PROVENTIL HFA;VENTOLIN HFA) 108 (90 Base) MCG/ACT inhaler Inhale 2 puffs into the lungs every 6 (six) hours as needed for wheezing or shortness of breath.    [provider]  albuterol (PROVENTIL) (2.5 MG/3ML) 0.083% nebulizer solution Take 3 mLs (2.5 mg total) by nebulization every 6 (six) hours as needed for wheezing. 07/05/14   Kathie Dike, MD  apixaban (ELIQUIS) 5 MG TABS tablet Take 2 tablets (10 mg total) by mouth 2 (two) times daily. On 08/12/17 @ 9PM, start 1 tab (5 mg) two times daily. Patient taking differently: Take 5 mg by mouth 2 (two) times daily. On 08/12/17 @ 9PM, start 1 tab (5 mg) two times daily. 08/06/17   Orson Eva, MD  atorvastatin (LIPITOR) 40 MG tablet Take 40 mg by mouth daily.    [provider]  cycloSPORINE (RESTASIS) 0.05 % ophthalmic emulsion Place 1 drop into both eyes daily.    [provider]  doxycycline (VIBRAMYCIN) 100 MG capsule Take 1 capsule (100 mg total) by mouth 2 (two) times daily. 03/17/19   Triplett, Tammy, PA-C  esomeprazole (NEXIUM) 40 MG capsule Take 1 capsule (40 mg total) by mouth 2 (two) times daily before a meal. 01/02/17   Fields, Marga Melnick, MD  furosemide (LASIX) 40 MG tablet Take 1 tablet (40 mg total) by mouth daily. 05/27/13   Kathie Dike, MD  gabapentin (NEURONTIN) 300 MG capsule Take 300 mg by mouth 3 (three) times daily.    [provider]  HYDROcodone-acetaminophen (NORCO/VICODIN) 5-325  MG tablet One tablet every six hours for pain.  Limit 7 days. 04/22/19   Sanjuana Kava, MD  hydrocortisone 1 % ointment Apply 1 application topically 2 (two) times daily. Patient not taking: Reported on 03/17/2019 12/13/16   Jonnie Kind, MD  insulin NPH-regular Human (NOVOLIN 70/30 RELION) (70-30) 100 UNIT/ML injection Inject 50 units into skin three times daily (breakfast, supper, bedtime) 08/06/17   Tat, Shanon Brow, MD  JARDIANCE 10 MG TABS tablet Take 10 mg by mouth daily. 01/29/19   [provider]  linaclotide (LINZESS) 290 MCG CAPS capsule Take 1 capsule (290 mcg total) by mouth daily before breakfast. 05/09/17   Annitta Needs, NP  lisinopril (PRINIVIL,ZESTRIL) 20 MG tablet Take 1 tablet (20 mg total) by mouth daily. 07/05/14   Kathie Dike, MD  metFORMIN (GLUCOPHAGE) 1000 MG tablet Take 1 tablet (1,000 mg total) by mouth 2 (two) times daily with a meal. 07/05/14   Kathie Dike, MD  montelukast (SINGULAIR) 10 MG tablet Take 10 mg by mouth at bedtime.     [provider]  naproxen (NAPROSYN) 500 MG tablet Take 500 mg by mouth 2 (two) times daily with a meal.  12/11/17   [provider]  nystatin-triamcinolone ointment (MYCOLOG) Apply 1 application topically 2 (two) times daily. To affected area.,use after drying tissue. Patient not taking: Reported on 03/17/2019 04/25/18   Jonnie Kind, MD  ondansetron (ZOFRAN ODT) 4 MG disintegrating tablet Take 4 mg by mouth every 8 (eight) hours as needed for nausea or vomiting.  10/11/15   [provider]  potassium chloride (K-DUR) 10 MEQ tablet Take 10 mEq by mouth every evening.  07/05/14   [provider]  silver sulfADIAZINE (SILVADENE) 1 % cream Apply 1 application topically daily. Patient taking differently: Apply 1 application topically as needed.  07/03/18   Jonnie Kind, MD  Vitamin D, Ergocalciferol, (DRISDOL) 50000 units CAPS capsule Take 50,000 Units by mouth every 7 (seven) days.    [provider]    Family History Family History  Problem Relation Age of Onset  . Diabetes Mother   . Hypertension Mother   . COPD Mother   . Asthma Mother   . Hypercholesterolemia Mother   . Hypertension Sister   . Hyperlipidemia Sister   . Diabetes Sister   . Asthma Brother   . Alcohol abuse Brother   . Asthma Son   . Cancer Maternal Grandmother        lung, throat, breast  . Alzheimer's disease Maternal Grandmother   . Hypertension Maternal Grandmother   . Hypercholesterolemia Maternal Grandmother   . Heart disease Maternal Grandfather   . Stroke Maternal Grandfather   . Clotting disorder Maternal Grandfather        blood clots in legs  . Hypertension Sister   . Hyperlipidemia Sister   . Stroke Maternal Aunt   . Clotting disorder Maternal Aunt        blood clots in legs  . Hypertension Maternal Aunt   . Hypercholesterolemia Maternal Aunt   . Hypertension Maternal Aunt   . Asthma Maternal Aunt   . Clotting disorder Maternal Uncle        blood clot -legs traveled to heart and he died of heart attack  . Colon cancer Neg Hx     Social History Social History   Tobacco Use  . Smoking status: Current Some Day Smoker    Packs/day: 0.50    Years: 22.00    Pack years: 11.00    Types: Cigarettes  . Smokeless tobacco: Never Used  Substance Use Topics  . Alcohol use: No  . Drug use: No     Allergies   Patient has no known allergies.   Review of Systems Review of Systems  Constitutional: Negative for activity change and appetite change.  HENT: Negative for congestion, ear discharge, ear pain, facial swelling, nosebleeds, rhinorrhea, sneezing and tinnitus.   Eyes: Negative for photophobia, pain and discharge.  Respiratory: Negative for cough, choking, shortness of breath and wheezing.  Cardiovascular: Negative for chest pain, palpitations and leg swelling.  Gastrointestinal: Negative for abdominal pain, blood in stool, constipation, diarrhea, nausea and  vomiting.  Genitourinary: Negative for difficulty urinating, dysuria, flank pain, frequency and hematuria.  Musculoskeletal: Negative for back pain, gait problem, myalgias and neck pain.  Skin: Negative for color change, rash and wound.       Abscess  Neurological: Negative for dizziness, seizures, syncope, facial asymmetry, speech difficulty, weakness and numbness.  Hematological: Negative for adenopathy. Does not bruise/bleed easily.  Psychiatric/Behavioral: Negative for agitation, confusion, hallucinations, self-injury and suicidal ideas. The patient is not nervous/anxious.      Physical Exam Updated Vital Signs BP (!) 149/87 (BP Location: Right Wrist)   Pulse 94   Temp 98.7 F (37.1 C) (Oral)   Resp 17   Ht 5\' 5"  (1.651 m)   Wt 108.9 kg   LMP 09/20/2015 (Exact Date)   SpO2 97%   BMI 39.94 kg/m   Physical Exam Vitals signs and nursing note reviewed.  Constitutional:      Appearance: She is well-developed. She is not toxic-appearing.  HENT:     Head: Normocephalic.     Right Ear: Tympanic membrane and external ear normal.     Left Ear: Tympanic membrane and external ear normal.  Eyes:     General: Lids are normal.     Pupils: Pupils are equal, round, and reactive to light.  Neck:     Musculoskeletal: Normal range of motion and neck supple.     Vascular: No carotid bruit.  Cardiovascular:     Rate and Rhythm: Normal rate and regular rhythm.     Pulses: Normal pulses.     Heart sounds: Normal heart sounds.  Pulmonary:     Effort: No respiratory distress.     Breath sounds: Normal breath sounds.  Abdominal:     General: Bowel sounds are normal.     Palpations: Abdomen is soft.     Tenderness: There is no abdominal tenderness. There is no guarding.  Musculoskeletal: Normal range of motion.       Legs:     Comments: Patient has an abscess that measures 5.4 cm of the medial posterior right thigh.  There is increased redness, warmth, and tenderness this area.  No red  streaks appreciated.  No drainage at this time.  Lymphadenopathy:     Head:     Right side of head: No submandibular adenopathy.     Left side of head: No submandibular adenopathy.     Cervical: No cervical adenopathy.  Skin:    General: Skin is warm and dry.  Neurological:     Mental Status: She is alert and oriented to person, place, and time.     Cranial Nerves: No cranial nerve deficit.     Sensory: No sensory deficit.  Psychiatric:        Speech: Speech normal.      ED Treatments / Results  Labs (all labs ordered are listed, but only abnormal results are displayed) Labs Reviewed  AEROBIC CULTURE (SUPERFICIAL SPECIMEN)    EKG None  Radiology No results found.  Procedures .Marland KitchenIncision and Drainage  Date/Time: 05/02/2019 2:40 PM Performed by: Lily Kocher, PA-C Authorized by: Lily Kocher, PA-C   Consent:    Consent obtained:  Verbal   Consent given by:  Patient   Risks discussed:  Incomplete drainage, infection and pain Universal protocol:    Procedure explained and questions answered to patient or proxy's satisfaction: yes  Immediately prior to procedure a time out was called: yes     Patient identity confirmed:  Arm band Location:    Type:  Abscess   Location:  Lower extremity   Lower extremity location:  Leg   Leg location:  R upper leg Pre-procedure details:    Skin preparation:  Betadine Anesthesia (see MAR for exact dosages):    Anesthesia method:  Local infiltration   Local anesthetic:  Bupivacaine 0.25% w/o epi Procedure type:    Complexity:  Simple Procedure details:    Incision types:  Single straight   Incision depth:  Subcutaneous   Scalpel blade:  11   Wound management:  Probed and deloculated and irrigated with saline   Drainage:  Bloody   Drainage amount:  Scant   Wound treatment:  Wound left open Post-procedure details:    Patient tolerance of procedure:  Tolerated well, no immediate complications   (including critical care  time)  Medications Ordered in ED Medications  bupivacaine (MARCAINE) 0.25 % (with pres) injection 20 mL (has no administration in time range)     Initial Impression / Assessment and Plan / ED Course  I have reviewed the triage vital signs and the nursing notes.  Pertinent labs & imaging results that were available during my care of the patient were reviewed by me and considered in my medical decision making (see chart for details).         Final Clinical Impressions(s) / ED Diagnoses MDM  Patient is a 53 year old diabetic female with abscess to the right leg that is been going on for a little over a week.  Incision and drainage was carried out.  Culture was sent to the lab.  Patient tolerated the procedure without problem.  There is no high fever, there is no changes in blood pressure, no signs of systemic infection.  Patient will be treated with doxycycline.  Patient will use Tylenol extra strength for mild pain.  Patient given 12 Percocet tablets for more severe pain.  Patient is advised to follow-up with her primary physician and return to the emergency department if any changes in her condition, problems, or concerns.   Final diagnoses:  Abscess of right thigh    ED Discharge Orders         Ordered    oxyCODONE-acetaminophen (PERCOCET/ROXICET) 5-325 MG tablet  Every 6 hours PRN     05/02/19 1447    doxycycline (VIBRAMYCIN) 100 MG capsule  2 times daily     05/02/19 1447           Lily Kocher, PA-C 05/03/19 1108    Milton Ferguson, MD 05/05/19 1650

## 2019-05-06 LAB — AEROBIC CULTURE W GRAM STAIN (SUPERFICIAL SPECIMEN)
Gram Stain: NONE SEEN
Special Requests: NORMAL

## 2019-05-07 ENCOUNTER — Telehealth: Payer: Self-pay | Admitting: Emergency Medicine

## 2019-05-07 NOTE — Telephone Encounter (Signed)
Post ED Visit - Positive Culture Follow-up  Culture report reviewed by antimicrobial stewardship pharmacist: [place] Team []  Elenor Quinones, Pharm.D. []  Heide Guile, Pharm.D., BCPS AQ-ID []  Parks Neptune, Pharm.D., BCPS []  Alycia Rossetti, Pharm.D., BCPS []  [place], [place].D., BCPS, AAHIVP []  Legrand Como, Pharm.D., BCPS, AAHIVP []  Salome Arnt, PharmD, BCPS []  Johnnette Gourd, PharmD, BCPS []  Hughes Better, PharmD, BCPS []  Leeroy Cha, PharmD []  Laqueta Linden, PharmD, BCPS []  Albertina Parr, PharmD Nicoletta Dress PharmD  [place] Team []  Leodis Sias, PharmD []  Lindell Spar, PharmD []  Royetta Asal, PharmD []  Graylin Shiver, Rph []  Rema Fendt) Glennon Mac, PharmD []  Arlyn Dunning, PharmD []  Netta Cedars, PharmD []  Dia Sitter, PharmD []  Leone Haven, PharmD []  Gretta Arab, PharmD []  Theodis Shove, PharmD []  Peggyann Juba, PharmD []  Reuel Boom, PharmD   Positive wound culture Treated with doxycycline, organism sensitive to the same and no further patient follow-up is required at this time.  Hazle Nordmann 05/07/2019, 10:13 AM

## 2019-05-08 ENCOUNTER — Other Ambulatory Visit: Payer: Self-pay

## 2019-05-08 ENCOUNTER — Encounter: Payer: Self-pay | Admitting: Family Medicine

## 2019-05-08 ENCOUNTER — Ambulatory Visit (INDEPENDENT_AMBULATORY_CARE_PROVIDER_SITE_OTHER): Payer: Medicaid Other | Admitting: Family Medicine

## 2019-05-08 VITALS — BP 91/55 | HR 71 | Temp 97.8°F | Ht 65.0 in | Wt 245.2 lb

## 2019-05-08 DIAGNOSIS — E0840 Diabetes mellitus due to underlying condition with diabetic neuropathy, unspecified: Secondary | ICD-10-CM

## 2019-05-08 DIAGNOSIS — I2699 Other pulmonary embolism without acute cor pulmonale: Secondary | ICD-10-CM

## 2019-05-08 DIAGNOSIS — J449 Chronic obstructive pulmonary disease, unspecified: Secondary | ICD-10-CM

## 2019-05-08 DIAGNOSIS — A4901 Methicillin susceptible Staphylococcus aureus infection, unspecified site: Secondary | ICD-10-CM

## 2019-05-08 DIAGNOSIS — Z794 Long term (current) use of insulin: Secondary | ICD-10-CM | POA: Diagnosis not present

## 2019-05-08 DIAGNOSIS — R310 Gross hematuria: Secondary | ICD-10-CM | POA: Diagnosis not present

## 2019-05-08 DIAGNOSIS — B379 Candidiasis, unspecified: Secondary | ICD-10-CM

## 2019-05-08 DIAGNOSIS — E1165 Type 2 diabetes mellitus with hyperglycemia: Secondary | ICD-10-CM | POA: Diagnosis not present

## 2019-05-08 DIAGNOSIS — Z720 Tobacco use: Secondary | ICD-10-CM

## 2019-05-08 LAB — POCT URINALYSIS DIP (CLINITEK)
Bilirubin, UA: NEGATIVE
Glucose, UA: 500 mg/dL — AB
Ketones, POC UA: NEGATIVE mg/dL
Leukocytes, UA: NEGATIVE
Nitrite, UA: NEGATIVE
POC PROTEIN,UA: NEGATIVE
Spec Grav, UA: 1.01 (ref 1.010–1.025)
Urobilinogen, UA: 0.2 E.U./dL
pH, UA: 6 (ref 5.0–8.0)

## 2019-05-08 LAB — POCT GLYCOSYLATED HEMOGLOBIN (HGB A1C): Hemoglobin A1C: 11.4 % — AB (ref 4.0–5.6)

## 2019-05-08 MED ORDER — CHANTIX STARTING MONTH PAK 0.5 MG X 11 & 1 MG X 42 PO TABS: ORAL_TABLET | ORAL | 0 refills | Status: DC

## 2019-05-08 MED ORDER — MUPIROCIN 2 % EX OINT: 1.0000 "application " | TOPICAL_OINTMENT | Freq: Every day | CUTANEOUS | 0 refills | Status: DC

## 2019-05-08 MED ORDER — FLUCONAZOLE 150 MG PO TABS: ORAL_TABLET | ORAL | 0 refills | Status: DC

## 2019-05-08 MED ORDER — SULFAMETHOXAZOLE-TRIMETHOPRIM 400-80 MG PO TABS: 1.0000 | ORAL_TABLET | Freq: Two times a day (BID) | ORAL | 0 refills | Status: DC

## 2019-05-08 MED ORDER — CHLORHEXIDINE GLUCONATE 4 % EX LIQD: Freq: Every day | CUTANEOUS | 0 refills | Status: DC | PRN

## 2019-05-08 NOTE — Patient Instructions (Addendum)
A1c 11.4(goal 7.0) Diflucan ever 3rd day Bactroban in the nostrils nightly-2 weeks Hibiclens on areas of abscess Dr. Levada Dy office-endocrinology-[place]-last labwork Chantix

## 2019-05-08 NOTE — Progress Notes (Signed)
New Patient Office Visit  Subjective:  Patient ID: VALLEY GIOVE, female    DOB: July 10, 1966  Age: 53 y.o. MRN: [id number]  CC:  Chief Complaint  Patient presents with  . New Patient (Initial Visit)     . Vaginitis    HPI Sonya James presents for yeast infection and chantix rx   DM-A1c-Jardiance , NPH regular, 70/30-not well controlled-sees endo  Vaginal irritation with yeast infection-pt has not check glucose readings recently.  05-02-19 ER visit Patient has an abscess that measures 5.4 cm of the medial posterior right thigh.  There is increased redness, warmth, and tenderness this area.  No red streaks appreciated.  No drainage at this time. I and D- Gram Stain NO WBC SEEN  ABUNDANT GRAM POSITIVE COCCI IN PAIRS IN CLUSTERS  Performed at [facility], [place] [address], [place], [place] [address]   Culture MODERATE STAPHYLOCOCCUS AUREUS   Report Status 05/06/2019 FINAL   Organism ID, Bacteria STAPHYLOCOCCUS AUREUS    TETRACYCLINE <=1 SENSITIVE  Sensitive  Pt given doxycycline   Dr. Wayne Keeling-04-22-19-sees ortho for injections and pain management for chronic knee pain The patient requests injections of both knees, verbal consent was obtained-The left and right knee were individually prepped appropriately after time out was performed.  Depo-Medrol 40 mg with several cc's of plain xylocaine. Anesthesia was provided by ethyl chloride   Pulmonary embolism (HCC)-[facility] -Dr. Delton Coombes 1.  Unprovoked pulmonary embolism: - Diagnosed in November 2018, on Eliquis 5 mg twice daily, without any bleeding complications. -She did not have any risk factors for pulmonary embolism. - Her blood work was negative for lupus anticoagulant, anticardiolipin antibodies, anti-beta-2 glycoprotein 1 antibodies.  It was also negative for other tests like Antithrombin III, factor V [place], [place] 20210A mutation, protein C and protein S deficiencies. -CT CAP in May 2019 was negative for any  metastatic disease.  This was done because of her history of endometrial cancer.  As she had unprovoked pulmonary embolism, her chance of recurrent DVT is about 8% at year 1 and about 40% at year 5.  At this time the benefits of continued anticoagulation outweigh the risks.  Hence I have recommended indefinite anticoagulation.  Patient is agreeable to this option.   - D-Dimer from 01/31/19 was negative. She is tolerating Eliquis well. We will continue with anticoagulation therapy. She can return to clinic in 1 year or sooner if needed.    [facility]-history of constipation, dysphagia, GERD. 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 Colonoscopy with hyperplastic polyps. Linzess 290 mcg has done well for constipation,. Nexium BID for GERD.  GYN note-[facility]  Examination without evidence of recurrent cancer > 2years. We would recommend follow-up examinations at 6 month intervals. Recent studies indicate no utility for routine pap smears to follow patients treated for endometrial cancer with hysterectomy.  Return to GYN oncology as needed  Diagnosis:.Endometrial cancer -Malignant neoplasm of corpus uteri, except isthmus   [facility]-CPAP-2019 last visit  Controlled History copied for reference Fill Date ID   Written Drug Qty Days Prescriber Rx # Pharmacy Refill   Daily Dose* Pymt Type PMP    05/02/2019  1   05/02/2019  Oxycodone-Acetaminophen 5-325  10.00 3 Ho Bry   [id number]   [facility] ([id number])   0  25.00 MME  Medicaid   [place]  04/22/2019  1   04/22/2019  Hydrocodone-Acetamin 5-325 MG  28.00 7 J Kee  [id number] ([id number])   0  20.00 MME  Medicaid   [place]  04/09/2019  1   04/08/2019  Hydrocodone-Acetamin 5-325 MG  25.00 7 J Kee   [id number]   Wal ([id number])   0  17.86 MME  Medicaid   [place]  03/17/2019  1   03/17/2019  Hydrocodone-Acetamin 5-325 MG  8.00 2 Ta Tri   [id number]   Wal ([id number])   0  20.00 MME  Medicaid   [place]  02/26/2019   1   02/25/2019  Hydrocodone-Acetamin 5-325 MG  25.00 7 J Kee   [id number]   Wal ([id number])   0  17.86 MME  Medicaid   [place]  02/19/2019  1   02/19/2019  Hydrocodone-Acetamin 5-325 MG  25.00 7 J Kee   [id number]   Wal ([id number])   0  17.86 MME  Medicaid   [place]  01/08/2019  1   01/08/2019  Hydrocodone-Acetamin 5-325 MG  25.00 7 J Kee   [id number]   Wal ([id number])   0  17.86 MME  Medicaid   [place]  12/03/2018  1   12/03/2018  Hydrocodone-Acetamin 5-325 MG  25.00 7 J Kee   [id number]   Wal ([id number])   0  17.86 MME  Medicaid   [place]  11/20/2018  1   11/20/2018  Hydrocodone-Acetamin 5-325 MG  28.00 7 J Kee   [id number]   Wal ([id number])   0  20.00 MME  Medicaid   [place]  10/23/2018  1   10/23/2018  Hydrocodone-Acetamin 5-325 MG  28.00 7 J Kee   [id number]   Wal ([id number])   0  20.00 MME  Medicaid   [place]  09/25/2018  1   09/25/2018  Hydrocodone-Acetamin 5-325 MG  30.00 5 J Kee   [id number]   Wal ([id number])   0  30.00 MME  Medicaid   [place]  08/14/2018  1   08/14/2018  Hydrocodone-Acetamin 5-325 MG  30.00 5 J Kee   [id number]   Wal ([id number])   0  30.00 MME  Medicaid   [place]  07/31/2018  1   07/31/2018  Hydrocodone-Acetamin 5-325 MG  30.00 5 J Kee   [id number]         Past Medical History:  Diagnosis Date  . Anemia   . Anxiety   . Arthritis   . Asthma   . Autonomic neuropathy   . Constipation   . COPD (chronic obstructive pulmonary disease) ([place])   . CTS (carpal tunnel syndrome)   . Depression   . Diabetes mellitus without complication ([place])   . Endometrial cancer ([place]) 2017  . GERD (gastroesophageal reflux disease)   . Headache(784.0)   . HTN (hypertension)   . Hypercholesterolemia   . Hypothyroidism   . IBS (irritable bowel syndrome)   . Osteoarthritis    osteoarthritis  . Pancreatitis    per patient   . Recurrent boils    buttocks and low back.  . Shortness of breath   . Sleep apnea    CPAP machine  . Uterine cancer ([place]) 2017  . Vitamin D deficiency     Past Surgical History:  Procedure Laterality Date  . ABDOMINAL HYSTERECTOMY  2018   endometrial cancer,  surgery done at [facility]   . BIOPSY  01/02/2017   Procedure: BIOPSY;  Surgeon: Danie Binder, MD;  Location: [facility];  Service: Endoscopy;;  gastric biopsy  . CESAREAN SECTION  1989  . COLONOSCOPY WITH PROPOFOL N/A 01/02/2017  Dr. Oneida Alar: redundant colon, two 2-3 mm polyps in sigmoid colon (hyperplastic)  . ESOPHAGOGASTRODUODENOSCOPY  05/22/2011   Dr. Oneida Alar: H.pylori gastritis   . ESOPHAGOGASTRODUODENOSCOPY (EGD) WITH PROPOFOL N/A 01/02/2017   Dr. 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 FOOT SURGERY Left    tendon repair  . OOPHORECTOMY    . POLYPECTOMY  01/02/2017   Procedure: POLYPECTOMY;  Surgeon: Danie Binder, MD;  Location: [facility];  Service: Endoscopy;;  sigmoid colon polyps times 2  . REDUCTION MAMMAPLASTY  1996  . SAVORY DILATION N/A 01/02/2017   Procedure: SAVORY DILATION;  Surgeon: Danie Binder, MD;  Location: [facility];  Service: Endoscopy;  Laterality: N/A;  . TUBAL LIGATION      Family History  Problem Relation Age of Onset  . Diabetes Mother   . Hypertension Mother   . COPD Mother   . Asthma Mother   . Hypercholesterolemia Mother   . Hypertension Sister   . Hyperlipidemia Sister   . Diabetes Sister   . Asthma Brother   . Alcohol abuse Brother   . Asthma Son   . Cancer Maternal Grandmother        lung, throat, breast  . Alzheimer's disease Maternal Grandmother   . Hypertension Maternal Grandmother   . Hypercholesterolemia Maternal Grandmother   . Heart disease Maternal Grandfather   . Stroke Maternal Grandfather   . Clotting disorder Maternal Grandfather        blood clots in legs  . Hypertension Sister   . Hyperlipidemia Sister   . Stroke Maternal Aunt   . Clotting disorder Maternal Aunt        blood clots in legs  . Hypertension Maternal Aunt   . Hypercholesterolemia Maternal Aunt   . Hypertension Maternal Aunt   . Asthma Maternal Aunt   . Clotting disorder Maternal Uncle         blood clot -legs traveled to heart and he died of heart attack  . Colon cancer Neg Hx     Social History   Socioeconomic History  . Marital status: Married    Spouse name: Not on file  . Number of children: 1  . Years of education: Not on file  . Highest education level: Not on file  Occupational History  . Not on file  Social Needs  . Financial resource strain: Not on file  . Food insecurity    Worry: Not on file    Inability: Not on file  . Transportation needs    Medical: Not on file    Non-medical: Not on file  Tobacco Use  . Smoking status: Current Some Day Smoker    Packs/day: 0.50    Years: 22.00    Pack years: 11.00    Types: Cigarettes  . Smokeless tobacco: Never Used  Substance and Sexual Activity  . Alcohol use: No  . Drug use: No  . Sexual activity: Yes    Birth control/protection: Surgical    Comment: hyst  Lifestyle  . Physical activity    Days per week: Not on file    Minutes per session: Not on file  . Stress: Not on file  Relationships  . Social Herbalist on phone: Not on file    Gets together: Not on file    Attends religious service: Not on file    Active member of club or organization: Not on file    Attends meetings of  clubs or organizations: Not on file    Relationship status: Not on file  . Intimate partner violence    Fear of current or ex partner: Not on file    Emotionally abused: Not on file    Physically abused: Not on file    Forced sexual activity: Not on file  Other Topics Concern  . Not on file  Social History Narrative   Drinks coffee occasionally, soda 2-3 daily     ROS Review of Systems  Eyes: Positive for photophobia.  Respiratory: Positive for chest tightness, shortness of breath and wheezing.   Gastrointestinal: Positive for constipation.  Endocrine: Positive for heat intolerance and polyuria.  Genitourinary: Positive for hematuria.  Musculoskeletal: Positive for arthralgias, back pain, joint  swelling, myalgias, neck pain and neck stiffness.  Skin:       abscesses  Allergic/Immunologic: Positive for environmental allergies.  Neurological: Positive for light-headedness and headaches.  Hematological: Bruises/bleeds easily.  Psychiatric/Behavioral:       Sad mood    Objective:   Today's Vitals: BP (!) 91/55 (BP Location: Left Arm, Patient Position: Sitting, Cuff Size: Large)   Pulse 71   Temp 97.8 F (36.6 C) (Oral)   Ht 5\' 5"  (1.651 m)   Wt 245 lb 3.2 oz (111.2 kg)   LMP 09/20/2015 (Exact Date)   SpO2 95%   BMI 40.80 kg/m   Physical Exam Constitutional:      Appearance: She is obese.  Cardiovascular:     Rate and Rhythm: Normal rate and regular rhythm.     Pulses: Normal pulses.     Heart sounds: Normal heart sounds.  Pulmonary:     Effort: Pulmonary effort is normal.     Breath sounds: Normal breath sounds.  Psychiatric:        Mood and Affect: Mood normal.        Behavior: Behavior normal.     Assessment & Plan:    Outpatient Encounter Medications as of 05/08/2019  Medication Sig  . albuterol (PROVENTIL HFA;VENTOLIN HFA) 108 (90 Base) MCG/ACT inhaler Inhale 2 puffs into the lungs every 6 (six) hours as needed for wheezing or shortness of breath.  [name] albuterol (PROVENTIL) (2.5 MG/3ML) 0.083% nebulizer solution Take 3 mLs (2.5 mg total) by nebulization every 6 (six) hours as needed for wheezing.  [name] apixaban (ELIQUIS) 5 MG TABS tablet Take 2 tablets (10 mg total) by mouth 2 (two) times daily. On 08/12/17 @ 9PM, start 1 tab (5 mg) two times daily. (Patient taking differently: Take 5 mg by mouth 2 (two) times daily. On 08/12/17 @ 9PM, start 1 tab (5 mg) two times daily.)  . atorvastatin (LIPITOR) 40 MG tablet Take 40 mg by mouth daily.  . cycloSPORINE (RESTASIS) 0.05 % ophthalmic emulsion Place 1 drop into both eyes daily.  [name] doxycycline (VIBRAMYCIN) 100 MG capsule Take 1 capsule (100 mg total) by mouth 2 (two) times daily.  [name] esomeprazole (NEXIUM) 40 MG capsule  Take 1 capsule (40 mg total) by mouth 2 (two) times daily before a meal.  . furosemide (LASIX) 40 MG tablet Take 1 tablet (40 mg total) by mouth daily.  [name] gabapentin (NEURONTIN) 300 MG capsule Take 300 mg by mouth 3 (three) times daily.  [name] HYDROcodone-acetaminophen (NORCO/VICODIN) 5-325 MG tablet One tablet every six hours for pain.  Limit 7 days.  . hydrocortisone 1 % ointment Apply 1 application topically 2 (two) times daily. (Patient not taking: Reported on 03/17/2019)  . insulin NPH-regular Human (NOVOLIN 70/30  RELION) (70-30) 100 UNIT/ML injection Inject 50 units into skin three times daily (breakfast, supper, bedtime)  . JARDIANCE 10 MG TABS tablet Take 10 mg by mouth daily.  [name] linaclotide (LINZESS) 290 MCG CAPS capsule Take 1 capsule (290 mcg total) by mouth daily before breakfast.  . lisinopril (PRINIVIL,ZESTRIL) 20 MG tablet Take 1 tablet (20 mg total) by mouth daily.  . metFORMIN (GLUCOPHAGE) 1000 MG tablet Take 1 tablet (1,000 mg total) by mouth 2 (two) times daily with a meal.  . montelukast (SINGULAIR) 10 MG tablet Take 10 mg by mouth at bedtime.   . naproxen (NAPROSYN) 500 MG tablet Take 500 mg by mouth 2 (two) times daily with a meal.   . nystatin-triamcinolone ointment (MYCOLOG) Apply 1 application topically 2 (two) times daily. To affected area.,use after drying tissue. (Patient not taking: Reported on 03/17/2019)  . ondansetron (ZOFRAN ODT) 4 MG disintegrating tablet Take 4 mg by mouth every 8 (eight) hours as needed for nausea or vomiting.   [name] oxyCODONE-acetaminophen (PERCOCET/ROXICET) 5-325 MG tablet Take 1 tablet by mouth every 6 (six) hours as needed.  . potassium chloride (K-DUR) 10 MEQ tablet Take 10 mEq by mouth every evening.   . silver sulfADIAZINE (SILVADENE) 1 % cream Apply 1 application topically daily. (Patient taking differently: Apply 1 application topically as needed. )  . Vitamin D, Ergocalciferol, (DRISDOL) 50000 units CAPS capsule Take 50,000 Units by mouth every 7  (seven) days.   No facility-administered encounter medications on file as of 05/08/2019.     1. Diabetes mellitus due to underlying condition with diabetic neuropathy, unspecified whether long term insulin use (HCC) SEES ENDO in [place] - POCT HgB A1C  2. Uncontrolled type 2 diabetes mellitus with hyperglycemia, with long-term current use of insulin (HCC) Insulin +Jardiance-concern for recurrent infection-urine normal, d/w pt using regular insulin on sliding scale. A1c currently 11. 4 - POCT URINALYSIS DIP (CLINITEK)  3. Gross hematuria Negative ua - Urinalysis w microscopic + reflex cultur  4. Candidiasis Diflucan-needs improved diabetic control  5. Chronic obstructive pulmonary disease, unspecified COPD type (HCC) albuterol  6. Staph aureus infection Bactrim-rx Recurrent infections-hibiclen-rx bactroban-nares-rx  TOB ABUSE-REQUESTED CHANTIX-risk/benefit/side effects d/w pt Edel Rivero Hannah Beat, MD

## 2019-05-09 LAB — URINALYSIS W MICROSCOPIC + REFLEX CULTURE
Bacteria, UA: NONE SEEN /HPF
Bilirubin Urine: NEGATIVE
Hgb urine dipstick: NEGATIVE
Hyaline Cast: NONE SEEN /LPF
Ketones, ur: NEGATIVE
Leukocyte Esterase: NEGATIVE
Nitrites, Initial: NEGATIVE
Protein, ur: NEGATIVE
RBC / HPF: NONE SEEN /HPF (ref 0–2)
Specific Gravity, Urine: 1.043 — ABNORMAL HIGH (ref 1.001–1.03)
Squamous Epithelial / HPF: NONE SEEN /HPF (ref <=5)
WBC, UA: NONE SEEN /HPF (ref 0–5)
pH: 5.5 (ref 5.0–8.0)

## 2019-05-09 LAB — NO CULTURE INDICATED

## 2019-05-11 DIAGNOSIS — B379 Candidiasis, unspecified: Secondary | ICD-10-CM | POA: Insufficient documentation

## 2019-05-11 DIAGNOSIS — R310 Gross hematuria: Secondary | ICD-10-CM

## 2019-05-11 DIAGNOSIS — A4901 Methicillin susceptible Staphylococcus aureus infection, unspecified site: Secondary | ICD-10-CM | POA: Insufficient documentation

## 2019-05-11 DIAGNOSIS — B370 Candidal stomatitis: Secondary | ICD-10-CM | POA: Insufficient documentation

## 2019-05-11 HISTORY — DX: Gross hematuria: R31.0

## 2019-05-16 ENCOUNTER — Ambulatory Visit (INDEPENDENT_AMBULATORY_CARE_PROVIDER_SITE_OTHER): Payer: Medicaid Other | Admitting: Gastroenterology

## 2019-05-16 ENCOUNTER — Other Ambulatory Visit: Payer: Self-pay

## 2019-05-16 ENCOUNTER — Encounter: Payer: Self-pay | Admitting: Gastroenterology

## 2019-05-16 VITALS — BP 157/90 | HR 60 | Temp 98.4°F | Ht 65.0 in | Wt 250.4 lb

## 2019-05-16 DIAGNOSIS — K5900 Constipation, unspecified: Secondary | ICD-10-CM | POA: Diagnosis not present

## 2019-05-16 DIAGNOSIS — K219 Gastro-esophageal reflux disease without esophagitis: Secondary | ICD-10-CM | POA: Diagnosis not present

## 2019-05-16 DIAGNOSIS — Z7689 Persons encountering health services in other specified circumstances: Secondary | ICD-10-CM | POA: Diagnosis not present

## 2019-05-16 NOTE — Assessment & Plan Note (Signed)
Doing well with Linzess 290 mcg daily. Return in 1 year.

## 2019-05-16 NOTE — Progress Notes (Signed)
Primary Care Physician:  Eston Esters, NP Primary GI: Dr. Oneida Alar   Chief Complaint  Patient presents with   Follow-up    Gerd,IBS,Pancreatitis    HPI:   Sonya James is a 53 y.o. female presenting today with a history of constipation, dysphagia, GERD. Remote history of pancreatitis in 2013 (CT with possible findings, lipase mildly elevated 111). CT with contrast May 2019 without any abnormalities. Did note mild hepatic steatosis. She has had no further episodes of this.   Notes Linzess 290 mcg works well for constipation. No vomiting. Occasional nausea. GERD controlled with Nexium.   Gets tender in upper abdomen. When stool is hard, then epigastric area starts aching. No vomiting. +nausea. Pills help. Linzess 290 mcg daily. No more pain in epigastric region over the last 2 weeks.   Keeps getting abscess/boils. PCP managing.   Past Medical History:  Diagnosis Date   Anemia    Anxiety    Arthritis    Asthma    Autonomic neuropathy    Constipation    COPD (chronic obstructive pulmonary disease) ([place])    CTS (carpal tunnel syndrome)    Depression    Diabetes mellitus without complication ([place])    Endometrial cancer ([place]) 2017   GERD (gastroesophageal reflux disease)    Headache(784.0)    HTN (hypertension)    Hypercholesterolemia    Hypothyroidism    IBS (irritable bowel syndrome)    Osteoarthritis    osteoarthritis   Pancreatitis    per patient    Recurrent boils    buttocks and low back.   Shortness of breath    Sleep apnea    CPAP machine   Uterine cancer ([place]) 2017   Vitamin D deficiency     Past Surgical History:  Procedure Laterality Date   ABDOMINAL HYSTERECTOMY  2018   endometrial cancer, surgery done at [place]  01/02/2017   Procedure: BIOPSY;  Surgeon: Danie Binder, MD;  [facility];  Service: Endoscopy;;  gastric biopsy   CESAREAN SECTION  1989   COLONOSCOPY WITH PROPOFOL N/A  01/02/2017   Dr. Oneida Alar: redundant colon, two 2-3 mm polyps in sigmoid colon (hyperplastic)   ESOPHAGOGASTRODUODENOSCOPY  05/22/2011   Dr. Oneida Alar: H.pylori gastritis    ESOPHAGOGASTRODUODENOSCOPY (EGD) WITH PROPOFOL N/A 01/02/2017   Dr. 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 s/p dilation, moderate gastritis (negative H.pylori)   FOOT SURGERY Left    tendon repair   OOPHORECTOMY     POLYPECTOMY  01/02/2017   Procedure: POLYPECTOMY;  Surgeon: Danie Binder, MD;  [facility];  Service: Endoscopy;;  sigmoid colon polyps times 2   REDUCTION MAMMAPLASTY  1996   SAVORY DILATION N/A 01/02/2017   Procedure: SAVORY DILATION;  Surgeon: Danie Binder, MD;  [facility];  Service: Endoscopy;  Laterality: N/A;   TUBAL LIGATION      Current Outpatient Medications  Medication Sig Dispense Refill   albuterol (PROVENTIL HFA;VENTOLIN HFA) 108 (90 Base) MCG/ACT inhaler Inhale 2 puffs into the lungs every 6 (six) hours as needed for wheezing or shortness of breath.     albuterol (PROVENTIL) (2.5 MG/3ML) 0.083% nebulizer solution Take 3 mLs (2.5 mg total) by nebulization every 6 (six) hours as needed for wheezing. 75 mL 12   apixaban (ELIQUIS) 5 MG TABS tablet Take 2 tablets (10 mg total) by mouth 2 (two) times daily. On 08/12/17 @ 9PM, start 1 tab (5 mg) two times daily. (  Patient taking differently: Take 5 mg by mouth 2 (two) times daily. On 08/12/17 @ 9PM, start 1 tab (5 mg) two times daily.) 72 tablet 0   atorvastatin (LIPITOR) 40 MG tablet Take 40 mg by mouth daily.     chlorhexidine (HIBICLENS) 4 % external liquid Apply topically daily as needed. Wash area of abscess 473 mL 0   cycloSPORINE (RESTASIS) 0.05 % ophthalmic emulsion Place 1 drop into both eyes daily.     doxycycline (VIBRAMYCIN) 100 MG capsule Take 1 capsule (100 mg total) by mouth 2 (two) times daily. 14 capsule 0   esomeprazole (NEXIUM) 40 MG capsule Take 1 capsule (40 mg total) by mouth 2 (two) times  daily before a meal. 60 capsule 11   Ferrous Gluconate-C-Folic Acid (IRON-C PO) Take by mouth.     fluconazole (DIFLUCAN) 150 MG tablet Take one po every 3rd day 3 tablet 0   furosemide (LASIX) 40 MG tablet Take 1 tablet (40 mg total) by mouth daily. 30 tablet 0   gabapentin (NEURONTIN) 300 MG capsule Take 300 mg by mouth 3 (three) times daily.     hydrocortisone 1 % ointment Apply 1 application topically 2 (two) times daily. 30 g prn   insulin NPH-regular Human (NOVOLIN 70/30 RELION) (70-30) 100 UNIT/ML injection Inject 50 units into skin three times daily (breakfast, supper, bedtime) 10 mL 1   JARDIANCE 10 MG TABS tablet Take 10 mg by mouth daily.     linaclotide (LINZESS) 290 MCG CAPS capsule Take 1 capsule (290 mcg total) by mouth daily before breakfast. 90 capsule 3   lisinopril (PRINIVIL,ZESTRIL) 20 MG tablet Take 1 tablet (20 mg total) by mouth daily. 30 tablet 0   metFORMIN (GLUCOPHAGE) 1000 MG tablet Take 1 tablet (1,000 mg total) by mouth 2 (two) times daily with a meal. 60 tablet 0   montelukast (SINGULAIR) 10 MG tablet Take 10 mg by mouth at bedtime.      mupirocin ointment (BACTROBAN) 2 % Place 1 application into the nose daily. 22 g 0   naproxen (NAPROSYN) 500 MG tablet Take 500 mg by mouth as needed.   5   ondansetron (ZOFRAN ODT) 4 MG disintegrating tablet Take 4 mg by mouth as needed for nausea or vomiting.      potassium chloride (K-DUR) 10 MEQ tablet Take 10 mEq by mouth every evening.      silver sulfADIAZINE (SILVADENE) 1 % cream Apply 1 application topically daily. (Patient taking differently: Apply 1 application topically as needed. ) 50 g 0   sulfamethoxazole-trimethoprim (BACTRIM) 400-80 MG tablet Take 1 tablet by mouth 2 (two) times daily. 20 tablet 0   varenicline (CHANTIX STARTING MONTH PAK) 0.5 MG X 11 & 1 MG X 42 tablet Take one 0.5 mg tablet by mouth once daily for 3 days, then increase to one 0.5 mg tablet twice daily for 4 days, then increase to  one 1 mg tablet twice daily. 53 tablet 0   Vitamin D, Ergocalciferol, (DRISDOL) 50000 units CAPS capsule Take 50,000 Units by mouth every 7 (seven) days.     No current facility-administered medications for this visit.     Allergies as of 05/16/2019   (No Known Allergies)    Family History  Problem Relation Age of Onset   Diabetes Mother    Hypertension Mother    COPD Mother    Asthma Mother    Hypercholesterolemia Mother    Hypertension Sister    Hyperlipidemia Sister    Diabetes Sister  Asthma Brother    Alcohol abuse Brother    Asthma Son    Cancer Maternal Grandmother        lung, throat, breast   Alzheimer's disease Maternal Grandmother    Hypertension Maternal Grandmother    Hypercholesterolemia Maternal Grandmother    Heart disease Maternal Grandfather    Stroke Maternal Grandfather    Clotting disorder Maternal Grandfather        blood clots in legs   Hypertension Sister    Hyperlipidemia Sister    Stroke Maternal Aunt    Clotting disorder Maternal Aunt        blood clots in legs   Hypertension Maternal Aunt    Hypercholesterolemia Maternal Aunt    Hypertension Maternal Aunt    Asthma Maternal Aunt    Clotting disorder Maternal Uncle        blood clot -legs traveled to heart and he died of heart attack   Colon cancer Neg Hx     Social History   Socioeconomic History   Marital status: Married    Spouse name: Not on file   Number of children: 1   Years of education: Not on file   Highest education level: Not on file  Occupational History   Not on file  Social Needs   Financial resource strain: Not on file   Food insecurity    Worry: Not on file    Inability: Not on file   Transportation needs    Medical: Not on file    Non-medical: Not on file  Tobacco Use   Smoking status: Current Some Day Smoker    Packs/day: 0.50    Years: 22.00    Pack years: 11.00    Types: Cigarettes   Smokeless tobacco: Never  Used  Substance and Sexual Activity   Alcohol use: No   Drug use: No   Sexual activity: Yes    Birth control/protection: Surgical    Comment: hyst  Lifestyle   Physical activity    Days per week: Not on file    Minutes per session: Not on file   Stress: Not on file  Relationships   Social connections    Talks on phone: Not on file    Gets together: Not on file    Attends religious service: Not on file    Active member of club or organization: Not on file    Attends meetings of clubs or organizations: Not on file    Relationship status: Not on file  Other Topics Concern   Not on file  Social History Narrative   Drinks coffee occasionally, soda 2-3 daily     Review of Systems: Gen: Denies fever, chills, anorexia. Denies fatigue, weakness, weight loss.  CV: Denies chest pain, palpitations, syncope, peripheral edema, and claudication. Resp: Denies dyspnea at rest, cough, wheezing, coughing up blood, and pleurisy. GI: see HPI Derm: Denies rash, itching, dry skin Psych: Denies depression, anxiety, memory loss, confusion. No homicidal or suicidal ideation.  Heme: Denies bruising, bleeding, and enlarged lymph nodes.  Physical Exam: BP (!) 157/90    Pulse 60    Temp 98.4 F (36.9 C)    Ht 5\' 5"  (1.651 m)    Wt 250 lb 6.4 oz (113.6 kg)    LMP 09/20/2015 (Exact Date)    BMI 41.67 kg/m  General:   Alert and oriented. No distress noted. Pleasant and cooperative.  Head:  Normocephalic and atraumatic. Eyes:  Conjuctiva clear without scleral icterus. Abdomen:  +BS,  soft, non-tender and non-distended. No rebound or guarding. No HSM or masses noted. Msk:  Symmetrical without gross deformities. Normal posture. Extremities:  Without edema. Neurologic:  Alert and  oriented x4 Psych:  Alert and cooperative. Normal mood and affect.

## 2019-05-16 NOTE — Patient Instructions (Signed)
I am glad you are doing well!  Continue Nexium and Linzess, 30 minutes before breakfast daily.  Have fun coloring your hair!  Will see you in 1 year  I enjoyed seeing you again today! As you know, I value our relationship and want to provide genuine, compassionate, and quality care. I welcome your feedback. If you receive a survey regarding your visit,  I greatly appreciate you taking time to fill this out. See you next time!  Annitta Needs, PhD, ANP-BC [facility]

## 2019-05-16 NOTE — Assessment & Plan Note (Signed)
Doing well with Nexium daily. No alarm signs/symptoms. Return in 1 year.

## 2019-05-22 ENCOUNTER — Other Ambulatory Visit: Payer: Self-pay | Admitting: Family Medicine

## 2019-05-22 ENCOUNTER — Ambulatory Visit (INDEPENDENT_AMBULATORY_CARE_PROVIDER_SITE_OTHER): Payer: Medicaid Other | Admitting: Family Medicine

## 2019-05-22 ENCOUNTER — Ambulatory Visit (HOSPITAL_COMMUNITY)
Admission: RE | Admit: 2019-05-22 | Discharge: 2019-05-22 | Disposition: A | Discharge status: Home / Self Care | Payer: Medicaid Other | Source: Ambulatory Visit | Attending: Family Medicine | Admitting: Family Medicine

## 2019-05-22 ENCOUNTER — Other Ambulatory Visit: Payer: Self-pay

## 2019-05-22 VITALS — BP 121/63 | HR 73 | Temp 98.3°F | Ht 65.0 in | Wt 246.0 lb

## 2019-05-22 DIAGNOSIS — Z23 Encounter for immunization: Secondary | ICD-10-CM | POA: Diagnosis not present

## 2019-05-22 DIAGNOSIS — M47812 Spondylosis without myelopathy or radiculopathy, cervical region: Secondary | ICD-10-CM | POA: Diagnosis not present

## 2019-05-22 DIAGNOSIS — M542 Cervicalgia: Secondary | ICD-10-CM

## 2019-05-22 DIAGNOSIS — Z7689 Persons encountering health services in other specified circumstances: Secondary | ICD-10-CM | POA: Diagnosis not present

## 2019-05-22 DIAGNOSIS — M4802 Spinal stenosis, cervical region: Secondary | ICD-10-CM | POA: Diagnosis not present

## 2019-05-22 DIAGNOSIS — M5030 Other cervical disc degeneration, unspecified cervical region: Secondary | ICD-10-CM

## 2019-05-22 NOTE — Patient Instructions (Signed)
Cervical spine film-[place]  Heating pad  Physical Therapy referral

## 2019-05-22 NOTE — Progress Notes (Signed)
Acute Office Visit  Subjective:    Patient ID: Sonya James, female    DOB: November 02, 1965, 53 y.o.   MRN: [id number]  Chief Complaint  Patient presents with  . Follow-up    2 wk  . Back Pain    tingling/burning  . Neck Pain    tingling/burning    HPI Patient is in today for tingling and burning with stiffness and difficulty turning neck.  NO MVA.  DDD in the past on xray.     No SOB. Pt states no acute symptoms CP, cough, nasal drainage. Past Medical History:  Diagnosis Date  . Anemia   . Anxiety   . Arthritis   . Asthma   . Autonomic neuropathy   . Constipation   . COPD (chronic obstructive pulmonary disease) ([place])   . CTS (carpal tunnel syndrome)   . Depression   . Diabetes mellitus without complication ([place])   . Endometrial cancer ([place]) 2017  . GERD (gastroesophageal reflux disease)   . Headache(784.0)   . HTN (hypertension)   . Hypercholesterolemia   . Hypothyroidism   . IBS (irritable bowel syndrome)   . Osteoarthritis    osteoarthritis  . Pancreatitis    per patient   . Recurrent boils    buttocks and low back.  . Shortness of breath   . Sleep apnea    CPAP machine  . Uterine cancer ([place]) 2017  . Vitamin D deficiency     Past Surgical History:  Procedure Laterality Date  . ABDOMINAL HYSTERECTOMY  2018   endometrial cancer, surgery done at [facility]   . BIOPSY  01/02/2017   Procedure: BIOPSY;  Surgeon: Danie Binder, MD;  [facility];  Service: Endoscopy;;  gastric biopsy  . CESAREAN SECTION  1989  . COLONOSCOPY WITH PROPOFOL N/A 01/02/2017   Dr. Oneida Alar: redundant colon, two 2-3 mm polyps in sigmoid colon (hyperplastic)  . ESOPHAGOGASTRODUODENOSCOPY  05/22/2011   Dr. Oneida Alar: H.pylori gastritis   . ESOPHAGOGASTRODUODENOSCOPY (EGD) WITH PROPOFOL N/A 01/02/2017   Dr. 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 FOOT SURGERY Left    tendon repair  . OOPHORECTOMY    . POLYPECTOMY   01/02/2017   Procedure: POLYPECTOMY;  Surgeon: Danie Binder, MD;  [facility];  Service: Endoscopy;;  sigmoid colon polyps times 2  . REDUCTION MAMMAPLASTY  1996  . SAVORY DILATION N/A 01/02/2017   Procedure: SAVORY DILATION;  Surgeon: Danie Binder, MD;  [facility];  Service: Endoscopy;  Laterality: N/A;  . TUBAL LIGATION      Family History  Problem Relation Age of Onset  . Diabetes Mother   . Hypertension Mother   . COPD Mother   . Asthma Mother   . Hypercholesterolemia Mother   . Hypertension Sister   . Hyperlipidemia Sister   . Diabetes Sister   . Asthma Brother   . Alcohol abuse Brother   . Asthma Son   . Cancer Maternal Grandmother        lung, throat, breast  . Alzheimer's disease Maternal Grandmother   . Hypertension Maternal Grandmother   . Hypercholesterolemia Maternal Grandmother   . Heart disease Maternal Grandfather   . Stroke Maternal Grandfather   . Clotting disorder Maternal Grandfather        blood clots in legs  . Hypertension Sister   . Hyperlipidemia Sister   . Stroke Maternal Aunt   . Clotting disorder  Maternal Aunt        blood clots in legs  . Hypertension Maternal Aunt   . Hypercholesterolemia Maternal Aunt   . Hypertension Maternal Aunt   . Asthma Maternal Aunt   . Clotting disorder Maternal Uncle        blood clot -legs traveled to heart and he died of heart attack  . Colon cancer Neg Hx     Social History   Socioeconomic History  . Marital status: Married    Spouse name: Not on file  . Number of children: 1  . Years of education: Not on file  . Highest education level: Not on file  Occupational History  . Not on file  Social Needs  . Financial resource strain: Not on file  . Food insecurity    Worry: Not on file    Inability: Not on file  . Transportation needs    Medical: Not on file    Non-medical: Not on file  Tobacco Use  . Smoking status: Current Some Day Smoker    Packs/day: 0.50    Years:  22.00    Pack years: 11.00    Types: Cigarettes  . Smokeless tobacco: Never Used  Substance and Sexual Activity  . Alcohol use: No  . Drug use: No  . Sexual activity: Yes    Birth control/protection: Surgical    Comment: hyst  Lifestyle  . Physical activity    Days per week: Not on file    Minutes per session: Not on file  . Stress: Not on file  Relationships  . Social Herbalist on phone: Not on file    Gets together: Not on file    Attends religious service: Not on file    Active member of club or organization: Not on file    Attends meetings of clubs or organizations: Not on file    Relationship status: Not on file  . Intimate partner violence    Fear of current or ex partner: Not on file    Emotionally abused: Not on file    Physically abused: Not on file    Forced sexual activity: Not on file  Other Topics Concern  . Not on file  Social History Narrative   Drinks coffee occasionally, soda 2-3 daily     Outpatient Medications Prior to Visit  Medication Sig Dispense Refill  . albuterol (PROVENTIL HFA;VENTOLIN HFA) 108 (90 Base) MCG/ACT inhaler Inhale 2 puffs into the lungs every 6 (six) hours as needed for wheezing or shortness of breath.    [name] albuterol (PROVENTIL) (2.5 MG/3ML) 0.083% nebulizer solution Take 3 mLs (2.5 mg total) by nebulization every 6 (six) hours as needed for wheezing. 75 mL 12  . apixaban (ELIQUIS) 5 MG TABS tablet Take 2 tablets (10 mg total) by mouth 2 (two) times daily. On 08/12/17 @ 9PM, start 1 tab (5 mg) two times daily. (Patient taking differently: Take 5 mg by mouth 2 (two) times daily. On 08/12/17 @ 9PM, start 1 tab (5 mg) two times daily.) 72 tablet 0  . atorvastatin (LIPITOR) 40 MG tablet Take 40 mg by mouth daily.    . chlorhexidine (HIBICLENS) 4 % external liquid Apply topically daily as needed. Wash area of abscess 473 mL 0  . cycloSPORINE (RESTASIS) 0.05 % ophthalmic emulsion Place 1 drop into both eyes daily.    [name] doxycycline  (VIBRAMYCIN) 100 MG capsule Take 1 capsule (100 mg total) by mouth 2 (two) times daily. [place]  capsule 0  . esomeprazole (NEXIUM) 40 MG capsule Take 1 capsule (40 mg total) by mouth 2 (two) times daily before a meal. 60 capsule 11  . Ferrous Gluconate-C-Folic Acid (IRON-C PO) Take by mouth.    . fluconazole (DIFLUCAN) 150 MG tablet Take one po every 3rd day 3 tablet 0  . furosemide (LASIX) 40 MG tablet Take 1 tablet (40 mg total) by mouth daily. 30 tablet 0  . gabapentin (NEURONTIN) 300 MG capsule Take 300 mg by mouth 3 (three) times daily.    . hydrocortisone 1 % ointment Apply 1 application topically 2 (two) times daily. 30 g prn  . insulin NPH-regular Human (NOVOLIN 70/30 RELION) (70-30) 100 UNIT/ML injection Inject 50 units into skin three times daily (breakfast, supper, bedtime) 10 mL 1  . JARDIANCE 10 MG TABS tablet Take 10 mg by mouth daily.    [name] linaclotide (LINZESS) 290 MCG CAPS capsule Take 1 capsule (290 mcg total) by mouth daily before breakfast. 90 capsule 3  . lisinopril (PRINIVIL,ZESTRIL) 20 MG tablet Take 1 tablet (20 mg total) by mouth daily. 30 tablet 0  . metFORMIN (GLUCOPHAGE) 1000 MG tablet Take 1 tablet (1,000 mg total) by mouth 2 (two) times daily with a meal. 60 tablet 0  . montelukast (SINGULAIR) 10 MG tablet Take 10 mg by mouth at bedtime.     . mupirocin ointment (BACTROBAN) 2 % Place 1 application into the nose daily. 22 g 0  . naproxen (NAPROSYN) 500 MG tablet Take 500 mg by mouth as needed.   5  . ondansetron (ZOFRAN ODT) 4 MG disintegrating tablet Take 4 mg by mouth as needed for nausea or vomiting.     . potassium chloride (K-DUR) 10 MEQ tablet Take 10 mEq by mouth every evening.     . silver sulfADIAZINE (SILVADENE) 1 % cream Apply 1 application topically daily. (Patient taking differently: Apply 1 application topically as needed. ) 50 g 0  . sulfamethoxazole-trimethoprim (BACTRIM) 400-80 MG tablet Take 1 tablet by mouth 2 (two) times daily. 20 tablet 0  . varenicline  (CHANTIX STARTING MONTH PAK) 0.5 MG X 11 & 1 MG X 42 tablet Take one 0.5 mg tablet by mouth once daily for 3 days, then increase to one 0.5 mg tablet twice daily for 4 days, then increase to one 1 mg tablet twice daily. 53 tablet 0  . Vitamin D, Ergocalciferol, (DRISDOL) 50000 units CAPS capsule Take 50,000 Units by mouth every 7 (seven) days.     No facility-administered medications prior to visit.     No Known Allergies  Review of Systems  Respiratory: Negative for cough and shortness of breath.   Cardiovascular: Negative for chest pain.  Musculoskeletal: Positive for neck pain.       Objective:    Physical Exam  Constitutional: She is oriented to person, place, and time. She appears well-developed and well-nourished.  HENT:  Head: Normocephalic and atraumatic.  Eyes: Conjunctivae are normal.  Neck: No JVD present. No tracheal deviation present.  Tenderness trapezius to palpation FROM neck-flex/extend/rotate  Cardiovascular: Normal rate, regular rhythm, normal heart sounds and intact distal pulses.  Pulmonary/Chest: Effort normal and breath sounds normal.  Abdominal: Soft. Bowel sounds are normal.  Musculoskeletal:     Comments: FROM shoulders Normal strength UE bilat  Neurological: She is alert and oriented to person, place, and time.    BP 121/63 (BP Location: Left Arm, Patient Position: Sitting, Cuff Size: Large)   Pulse 73   Temp 98.3  F (36.8 C) (Oral)   Ht 5\' 5"  (1.651 m)   Wt 246 lb (111.6 kg)   LMP 09/20/2015 (Exact Date)   SpO2 96%   BMI 40.94 kg/m  Wt Readings from Last 3 Encounters:  05/22/19 246 lb (111.6 kg)  05/16/19 250 lb 6.4 oz (113.6 kg)  05/08/19 245 lb 3.2 oz (111.2 kg)    Health Maintenance Due  Topic Date Due  . FOOT EXAM  10/14/1975  . OPHTHALMOLOGY EXAM  10/14/1975  . TETANUS/TDAP  10/13/1984  . INFLUENZA VACCINE  04/12/2019    Lab Results  Component Value Date   TSH 0.641 05/26/2013   Lab Results  Component Value Date   WBC  9.2 08/05/2017   HGB 13.0 08/05/2017   HCT 41.0 08/05/2017   MCV 94.7 08/05/2017   PLT 251 08/05/2017   Lab Results  Component Value Date   NA 137 08/06/2017   K 3.7 08/06/2017   CO2 28 08/06/2017   GLUCOSE 272 (H) 08/06/2017   BUN 8 08/06/2017   CREATININE 0.80 01/28/2018   BILITOT 0.4 08/04/2017   ALKPHOS 116 08/04/2017   AST 17 08/04/2017   ALT 13 (L) 08/04/2017   PROT 6.7 08/04/2017   ALBUMIN 3.3 (L) 08/04/2017   CALCIUM 9.6 08/06/2017   ANIONGAP 7 08/06/2017   No results found for: CHOL No results found for: HDL No results found for: LDLCALC No results found for: TRIG No results found for: [facility] Lab Results  Component Value Date   HGBA1C 11.4 (A) 05/08/2019       Assessment & Plan:  1. Neck pain  - DG Cervical Spine Complete; Future - Ambulatory referral to Physical Therapy Heat, flexeril-risk/benefit/side effects d/w, PT 2. Need for immunization against influenza  - Flu Vaccine QUAD 36+ mos IM Jovannie Ulibarri Hannah Beat, MD

## 2019-05-22 NOTE — Progress Notes (Unsigned)
Spine

## 2019-06-05 ENCOUNTER — Ambulatory Visit (HOSPITAL_COMMUNITY): Payer: Medicaid Other | Admitting: Physical Therapy

## 2019-06-05 ENCOUNTER — Telehealth (HOSPITAL_COMMUNITY): Payer: Self-pay | Admitting: Physical Therapy

## 2019-06-05 NOTE — Telephone Encounter (Signed)
pt came into the office to cancel today's appt due to she said she has another appt.

## 2019-06-06 ENCOUNTER — Other Ambulatory Visit: Payer: Self-pay

## 2019-06-06 ENCOUNTER — Ambulatory Visit (HOSPITAL_COMMUNITY): Payer: Medicaid Other | Attending: Family Medicine | Admitting: Physical Therapy

## 2019-06-06 ENCOUNTER — Encounter (HOSPITAL_COMMUNITY): Payer: Self-pay | Admitting: Physical Therapy

## 2019-06-06 DIAGNOSIS — M6281 Muscle weakness (generalized): Secondary | ICD-10-CM | POA: Diagnosis not present

## 2019-06-06 DIAGNOSIS — M542 Cervicalgia: Secondary | ICD-10-CM

## 2019-06-06 DIAGNOSIS — R293 Abnormal posture: Secondary | ICD-10-CM | POA: Insufficient documentation

## 2019-06-06 NOTE — Therapy (Signed)
[place], [place], [address] Phone: [phone]   Fax:  [phone]  Physical Therapy Evaluation  Patient Details  Name: Sonya James MRN: [id number] Date of Birth: 05-09-1966 Referring Provider (PT): Benny Lennert    Encounter Date: 06/06/2019  PT End of Session - 06/06/19 1321    Visit Number  1    Number of Visits  12    Date for PT Re-Evaluation  07/17/19    Authorization Type  meidicaid 3 visits requested    PT Start Time  1013    PT Stop Time  1045    PT Time Calculation (min)  32 min    Activity Tolerance  Patient tolerated treatment well    Behavior During Therapy  [facility] for tasks assessed/performed       Past Medical History:  Diagnosis Date  . Anemia   . Anxiety   . Arthritis   . Asthma   . Autonomic neuropathy   . Constipation   . COPD (chronic obstructive pulmonary disease) ([place])   . CTS (carpal tunnel syndrome)   . Depression   . Diabetes mellitus without complication ([place])   . Endometrial cancer ([place]) 2017  . GERD (gastroesophageal reflux disease)   . Headache(784.0)   . HTN (hypertension)   . Hypercholesterolemia   . Hypothyroidism   . IBS (irritable bowel syndrome)   . Osteoarthritis    osteoarthritis  . Pancreatitis    per patient   . Recurrent boils    buttocks and low back.  . Shortness of breath   . Sleep apnea    CPAP machine  . Uterine cancer ([place]) 2017  . Vitamin D deficiency     Past Surgical History:  Procedure Laterality Date  . ABDOMINAL HYSTERECTOMY  2018   endometrial cancer, surgery done at [facility]   . BIOPSY  01/02/2017   Procedure: BIOPSY;  Surgeon: Danie Binder, MD;  [facility];  Service: Endoscopy;;  gastric biopsy  . CESAREAN SECTION  1989  . COLONOSCOPY WITH PROPOFOL N/A 01/02/2017   Dr. Oneida Alar: redundant colon, two 2-3 mm polyps in sigmoid colon (hyperplastic)  . ESOPHAGOGASTRODUODENOSCOPY  05/22/2011   Dr. Oneida Alar: H.pylori gastritis    . ESOPHAGOGASTRODUODENOSCOPY (EGD) WITH PROPOFOL N/A 01/02/2017   Dr. 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 FOOT SURGERY Left    tendon repair  . OOPHORECTOMY    . POLYPECTOMY  01/02/2017   Procedure: POLYPECTOMY;  Surgeon: Danie Binder, MD;  [facility];  Service: Endoscopy;;  sigmoid colon polyps times 2  . REDUCTION MAMMAPLASTY  1996  . SAVORY DILATION N/A 01/02/2017   Procedure: SAVORY DILATION;  Surgeon: Danie Binder, MD;  [facility];  Service: Endoscopy;  Laterality: N/A;  . TUBAL LIGATION      There were no vitals filed for this visit.   Subjective Assessment - 06/06/19 1020    Subjective  Pt states that she has had an insidious onset of neck pain a year and and a half ago.  She states that it has caused her back to stiffen up as well, she has headaches and her shoulders ache.  She feels better with hot showers.    Pertinent History  OA, DM, HTN, COPD, asthma    How long can you sit comfortably?  not long about 15 minutes    How long can you stand comfortably?  Gets stiff after 15 minutes.    How long can you walk comfortably?  Pt states that she normally works with a cane.  She walks for about 15 minutes and then she needs to sit down more from knees and low back    Patient Stated Goals  I want may back and neck to stop hurting    Currently in Pain?  Yes    Pain Score  10-Worst pain ever    Pain Location  Teeth    Pain Orientation  Right;Left    Pain Descriptors / Indicators  Aching;Tightness    Pain Type  Chronic pain    Pain Radiating Towards  Goes into her shoulders    Pain Onset  More than a month ago    Pain Frequency  Constant    Aggravating Factors   activitiy    Pain Relieving Factors  hot showeers and BC's    Effect of Pain on Daily Activities  limits         [facility] PT Assessment - 06/06/19 0001      Assessment   Medical Diagnosis  cervical pain     Referring Provider  (PT)  Benny Lennert     Onset Date/Surgical Date  11/10/[birth year]    Hand Dominance  Right    Next MD Visit  10/10.2020    Prior Therapy  none       Precautions   Precautions  None      Restrictions   Weight Bearing Restrictions  No      Balance Screen   Has the patient fallen in the past 6 months  Yes    How many times?  1   fell in shower 4 months ago.    Has the patient had a decrease in activity level because of a fear of falling?   Yes    Is the patient reluctant to leave their home because of a fear of falling?   Yes      Home Environment   Living Environment  Other (Comment)      Prior Function   Level of Independence  Independent with basic ADLs      Cognition   Overall Cognitive Status  Within Functional Limits for tasks assessed      Observation/Other Assessments   Other Surveys   Oswestry Disability Index    Oswestry Disability Index   least pain 8/10; mostg 10, noww 10    average 9.5     Posture/Postural Control   Posture/Postural Control  Postural limitations    Postural Limitations  Rounded Shoulders;Forward head;Decreased lumbar lordosis      ROM / Strength   AROM / PROM / Strength  AROM;Strength      AROM   AROM Assessment Site  Cervical    Cervical Extension  45    Cervical - Right Side Bend  45    Cervical - Left Side Bend  45    Cervical - Right Rotation  40    Cervical - Left Rotation  60      Strength   Strength Assessment Site  Cervical    Cervical Extension  2-/5    Cervical - Right Side Bend  2-/5    Cervical - Left Side Bend  2-/5                Objective measurements completed on examination: See above findings.      [facility] Adult PT Treatment/Exercise - 06/06/19 0001  Exercises   Exercises  Neck      Neck Exercises: Seated   Lateral Flexion  Both;10 reps    Shoulder Rolls  Backwards;5 reps    Other Seated Exercise  scapular retraction x 10              PT Education - 06/06/19 1320    Education Details  The  importance of good posture/ ROM exercises    Person(s) Educated  Patient    Methods  Explanation;Verbal cues;Handout    Comprehension  Verbalized understanding;Returned demonstration       PT Short Term Goals - 06/06/19 1330      PT SHORT TERM GOAL #1   Title  PT cervical pain to decrease to no greater than a 6/10 to allow pt to sleep for 3 hours at a time.    Time  3    Period  Weeks    Status  New    Target Date  07/03/19      PT SHORT TERM GOAL #2   Title  PT to impreve her cervical rotation by 15 degrees to allow her to have a conversation with a person sitting beside her.    Time  3    Period  Weeks      PT SHORT TERM GOAL #3   Title  PT cervical strength to improve by 1 grade to allow headaches to decrease by 50%        PT Long Term Goals - 06/06/19 1332      PT LONG TERM GOAL #1   Title  Pt cervical pain to be no greater than a 4/10 to allow pt to be able to complete her normal ADL's without increased pain.    Time  6    Period  Weeks    Status  New    Target Date  07/24/19      PT LONG TERM GOAL #2   Title  PT cervical rotation to improve 20 degrees to allow pt to look behind her with ease.    Time  6    Period  Weeks    Status  New      PT LONG TERM GOAL #3   Title  Pt cervical strengh to increase  to at least a 4/5 to allow pt headaches to decrease by 75%.    Time  6    Period  Weeks    Status  New      PT LONG TERM GOAL #4   Title  PT to be displaying increased awareness of proper posture to decrease cervical, thoracic and lumbar pain.    Period  Weeks    Status  New             Plan - 06/06/19 1324    Clinical Impression Statement  Ms. Buracker is a 53 yo female who has had chronic neck pain for the past 18 months.  She usually uses BC's for her pain but her pain has exacerbated to the point where the medication is not helping her therefore she is being referred to skilled PT    Personal Factors and Comorbidities  Behavior Pattern;Comorbidity  3+;Finances;Fitness;Time since onset of injury/illness/exacerbation;Transportation    Comorbidities  OA, chronic knee, back and shoulder pain, HTN, COPD, smoker.    Examination-Activity Limitations  Bathing;Carry;Lift;Reach Overhead;Sleep    Examination-Participation Restrictions  Cleaning;Driving;Laundry;Meal Prep    Stability/Clinical Decision Making  Evolving/Moderate complexity    Clinical Decision Making  Moderate  Rehab Potential  Fair    PT Frequency  2x / week    PT Duration  6 weeks    PT Treatment/Interventions  ADLs/Self Care Home Management;Therapeutic activities;Therapeutic exercise;Patient/family education;Manual techniques;Dry needling    PT Next Visit Plan  begin cervical, thoracic excursions, cervical retraction, chest stretch, progress to isometrics, tband exercise both for postrue and for decompression.    PT Home Exercise Plan  sit tall, scapular retraction, cervical side bend and extension.    Consulted and Agree with Plan of Care  Patient       Patient will benefit from skilled therapeutic intervention in order to improve the following deficits and impairments:  Decreased activity tolerance, Decreased range of motion, Decreased strength, Impaired perceived functional ability, Impaired UE functional use, Postural dysfunction, Pain, Improper body mechanics  Visit Diagnosis: Cervicalgia - Plan: PT plan of care cert/re-cert  Abnormal posture - Plan: PT plan of care cert/re-cert  Muscle weakness (generalized) - Plan: PT plan of care cert/re-cert     Problem List Patient Active Problem List   Diagnosis Date Noted  . Neck pain 05/22/2019  . Gross hematuria 05/11/2019  . Candidiasis 05/11/2019  . Staph aureus infection 05/11/2019  . Pulmonary embolism without acute cor pulmonale ([place])   . Uncontrolled type 2 diabetes mellitus with hyperglycemia, with long-term current use of insulin ([place]) 08/04/2017  . Uncontrolled type 2 diabetes mellitus with diabetic  polyneuropathy, with long-term current use of insulin ([place]) 08/04/2017  . Hypercalcemia 08/04/2017  . Obesity, Class III, BMI 40-49.9 (morbid obesity) ([place]) 08/04/2017  . Hypokalemia 08/04/2017  . Pulmonary embolism ([place]) 08/03/2017  . Sleep apnea 08/03/2017  . Encounter for screening colonoscopy   . Gastritis without bleeding   . Dysphagia 12/14/2016  . S/P laparoscopic hysterectomy 11/04/2015  . Endometrial cancer ([place]) 10/25/2015  . Diabetes mellitus with neuropathy ([place]) 10/25/2015  . Cancer of endometrium ([place]) 10/18/2015  . Heavy menses 10/11/2015  . COPD exacerbation ([place]) 07/04/2014  . Acute bronchitis 05/25/2013  . Fever 05/25/2013  . Asthma 05/25/2013  . Chronic obstructive pulmonary disease ([place]) 05/25/2013  . Tobacco abuse 05/25/2013  . Obesity 05/25/2013  . HTN (hypertension), benign 05/25/2013  . Hypercholesteremia 05/25/2013  . Pedal edema 05/25/2013  . Thyromegaly 05/25/2013  . Abdominal pain 04/11/2011  . Constipation 04/11/2011  . GERD (gastroesophageal reflux disease) 04/11/2011    Rayetta Humphrey, PT CLT [phone] 06/06/2019, 1:40 PM  [place] [address] [place], [place], [address] Phone: [phone]   Fax:  [phone]  Name: ALEKSA ARAMBUL MRN: [id number] Date of Birth: 1965/11/16

## 2019-06-06 NOTE — Patient Instructions (Addendum)
AROM: Neck Extension    Bend head backward. Hold _3___ seconds. Repeat __10__ times per set. Do __1__ sets per session. Do _2___ sessions per day.  http://orth.exer.us/300   Copyright  VHI. All rights reserved.  AROM: Lateral Neck Flexion    Slowly tilt head toward one shoulder, then the other. Hold each position _3___ seconds. Repeat 10__ times per set. Do ___1_ sets per session. Do _2__ sessions per day.  http://orth.exer.us/296   Copyright  VHI. All rights reserved.  Scapular Retraction (Standing)   May do sitting With arms at sides, pinch shoulder blades together. Repeat __10__ times per set. Do __1__ sets per session. Do _2___ sessions per day.  http://orth.exer.us/944   Copyright  VHI. All rights reserved.   While sitting pull yourself up as tall as you can.  Hold for 5 seconds relax.  Repeat 10 x

## 2019-06-11 DIAGNOSIS — M542 Cervicalgia: Secondary | ICD-10-CM | POA: Diagnosis not present

## 2019-06-11 DIAGNOSIS — M546 Pain in thoracic spine: Secondary | ICD-10-CM | POA: Diagnosis not present

## 2019-06-11 DIAGNOSIS — G8929 Other chronic pain: Secondary | ICD-10-CM | POA: Diagnosis not present

## 2019-06-12 ENCOUNTER — Encounter (HOSPITAL_COMMUNITY): Payer: Self-pay

## 2019-06-12 ENCOUNTER — Ambulatory Visit (HOSPITAL_COMMUNITY): Payer: Medicaid Other | Attending: Family Medicine

## 2019-06-12 ENCOUNTER — Other Ambulatory Visit: Payer: Self-pay

## 2019-06-12 DIAGNOSIS — M6281 Muscle weakness (generalized): Secondary | ICD-10-CM

## 2019-06-12 DIAGNOSIS — R293 Abnormal posture: Secondary | ICD-10-CM | POA: Insufficient documentation

## 2019-06-12 DIAGNOSIS — M542 Cervicalgia: Secondary | ICD-10-CM | POA: Diagnosis not present

## 2019-06-12 NOTE — Therapy (Signed)
[place], [place], [address] Phone: [phone]   Fax:  [phone]  Physical Therapy Treatment  Patient Details  Name: Sonya James MRN: [id number] Date of Birth: 1966-06-21 Referring Provider (PT): Benny Lennert    Encounter Date: 06/12/2019  PT End of Session - 06/12/19 0954    Visit Number  2    Number of Visits  12    Date for PT Re-Evaluation  07/17/19    Authorization Type  Medicaid authorization approved 3 visits from 10/01-->06/25/19    Authorization Time Period  9/25-->07/24/19    Authorization - Visit Number  1    Authorization - Number of Visits  3    PT Start Time  0934    PT Stop Time  1012    PT Time Calculation (min)  38 min    Activity Tolerance  Patient tolerated treatment well    Behavior During Therapy  [facility] for tasks assessed/performed       Past Medical History:  Diagnosis Date  . Anemia   . Anxiety   . Arthritis   . Asthma   . Autonomic neuropathy   . Constipation   . COPD (chronic obstructive pulmonary disease) ([place])   . CTS (carpal tunnel syndrome)   . Depression   . Diabetes mellitus without complication ([place])   . Endometrial cancer ([place]) 2017  . GERD (gastroesophageal reflux disease)   . Headache(784.0)   . HTN (hypertension)   . Hypercholesterolemia   . Hypothyroidism   . IBS (irritable bowel syndrome)   . Osteoarthritis    osteoarthritis  . Pancreatitis    per patient   . Recurrent boils    buttocks and low back.  . Shortness of breath   . Sleep apnea    CPAP machine  . Uterine cancer ([place]) 2017  . Vitamin D deficiency     Past Surgical History:  Procedure Laterality Date  . ABDOMINAL HYSTERECTOMY  2018   endometrial cancer, surgery done at [facility]   . BIOPSY  01/02/2017   Procedure: BIOPSY;  Surgeon: Danie Binder, MD;  [facility];  Service: Endoscopy;;  gastric biopsy  . CESAREAN SECTION  1989  . COLONOSCOPY WITH PROPOFOL N/A 01/02/2017    Dr. Oneida Alar: redundant colon, two 2-3 mm polyps in sigmoid colon (hyperplastic)  . ESOPHAGOGASTRODUODENOSCOPY  05/22/2011   Dr. Oneida Alar: H.pylori gastritis   . ESOPHAGOGASTRODUODENOSCOPY (EGD) WITH PROPOFOL N/A 01/02/2017   Dr. 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 FOOT SURGERY Left    tendon repair  . OOPHORECTOMY    . POLYPECTOMY  01/02/2017   Procedure: POLYPECTOMY;  Surgeon: Danie Binder, MD;  [facility];  Service: Endoscopy;;  sigmoid colon polyps times 2  . REDUCTION MAMMAPLASTY  1996  . SAVORY DILATION N/A 01/02/2017   Procedure: SAVORY DILATION;  Surgeon: Danie Binder, MD;  [facility];  Service: Endoscopy;  Laterality: N/A;  . TUBAL LIGATION      Vitals:   06/12/19 0936  SpO2: 97%    Subjective Assessment - 06/12/19 0936    Subjective  Pt went to neurologist yesterday concerning her back pain, plans on a MRI for mid back and up.  She has headaches and shoulder pain, took BC this morning for a headache.  Current pain scale 8/10 sharp pain that comes and goes, BC helps and works fast.    Pertinent  History  OA, DM, HTN, COPD, asthma    Patient Stated Goals  I want may back and neck to stop hurting    Currently in Pain?  Yes    Pain Score  8     Pain Location  Back    Pain Orientation  Upper;Mid    Pain Descriptors / Indicators  Sharp    Pain Type  Chronic pain    Pain Radiating Towards  Goes into her shoulders    Pain Onset  More than a month ago    Pain Frequency  Constant    Aggravating Factors   activity    Pain Relieving Factors  hot showers and BC's    Effect of Pain on Daily Activities  limits                       [facility] Adult PT Treatment/Exercise - 06/12/19 0001      Posture/Postural Control   Posture/Postural Control  Postural limitations    Postural Limitations  Rounded Shoulders;Forward head;Decreased lumbar lordosis      Exercises   Exercises  Neck       Neck Exercises: Seated   Neck Retraction  10 reps;3 secs    Neck Retraction Limitations  verbal/tactile cueing    Cervical Rotation  10 reps;Both    Cervical Rotation Limitations  3D cervical excursion    Lateral Flexion  Both;10 reps    Lateral Flexion Limitations  3D cervical excursion    Shoulder Rolls  Backwards;10 reps    Other Seated Exercise  scapular retraction x 10     Other Seated Exercise  3D thoracic excursion      Neck Exercises: Stretches   Corner Stretch  3 reps;30 seconds             PT Education - 06/12/19 0950    Education Details  Reviewed goals, compliance with HEP, importance of good posture and breathing thorugh exercises    Person(s) Educated  Patient    Methods  Explanation;Tactile cues;Verbal cues    Comprehension  Verbalized understanding;Returned demonstration;Verbal cues required;Tactile cues required       PT Short Term Goals - 06/06/19 1330      PT SHORT TERM GOAL #1   Title  PT cervical pain to decrease to no greater than a 6/10 to allow pt to sleep for 3 hours at a time.    Time  3    Period  Weeks    Status  New    Target Date  07/03/19      PT SHORT TERM GOAL #2   Title  PT to impreve her cervical rotation by 15 degrees to allow her to have a conversation with a person sitting beside her.    Time  3    Period  Weeks      PT SHORT TERM GOAL #3   Title  PT cervical strength to improve by 1 grade to allow headaches to decrease by 50%        PT Long Term Goals - 06/06/19 1332      PT LONG TERM GOAL #1   Title  Pt cervical pain to be no greater than a 4/10 to allow pt to be able to complete her normal ADL's without increased pain.    Time  6    Period  Weeks    Status  New    Target Date  07/24/19      PT LONG  TERM GOAL #2   Title  PT cervical rotation to improve 20 degrees to allow pt to look behind her with ease.    Time  6    Period  Weeks    Status  New      PT LONG TERM GOAL #3   Title  Pt cervical strengh to increase   to at least a 4/5 to allow pt headaches to decrease by 75%.    Time  6    Period  Weeks    Status  New      PT LONG TERM GOAL #4   Title  PT to be displaying increased awareness of proper posture to decrease cervical, thoracic and lumbar pain.    Period  Weeks    Status  New            Plan - 06/12/19 1023    Clinical Impression Statement  Reviewed goals and assured compliance with HEP.  Pt able to recall 1/3 exercises, reviewed exercises with verbalized understand and demonstrates appropriate mechanics.  Session focus on cervical/thoracic mobility and posture strengthening.  Pt required some cueing to improve awareness of posture thorugh sessin as well as cueing to reduce valsalva manuever, did c/o SOB with mask of face.  Vitals WNL.  No reports of increased pain through session.    Personal Factors and Comorbidities  Behavior Pattern;Comorbidity 3+;Finances;Fitness;Time since onset of injury/illness/exacerbation;Transportation    Comorbidities  OA, chronic knee, back and shoulder pain, HTN, COPD, smoker.    Examination-Activity Limitations  Bathing;Carry;Lift;Reach Overhead;Sleep    Examination-Participation Restrictions  Cleaning;Driving;Laundry;Meal Prep    Stability/Clinical Decision Making  Evolving/Moderate complexity    Clinical Decision Making  Moderate    Rehab Potential  Fair    PT Frequency  2x / week    PT Duration  6 weeks    PT Treatment/Interventions  ADLs/Self Care Home Management;Therapeutic activities;Therapeutic exercise;Patient/family education;Manual techniques;Dry needling    PT Next Visit Plan  Add decompression exercises next session and begin isometrics.  Continues cervical/thoracic excursions and postural strengthening.  Progress to theraband exercises for posture.    PT Home Exercise Plan  sit tall, scapular retraction, cervical side bend and extension.       Patient will benefit from skilled therapeutic intervention in order to improve the following  deficits and impairments:  Decreased activity tolerance, Decreased range of motion, Decreased strength, Impaired perceived functional ability, Impaired UE functional use, Postural dysfunction, Pain, Improper body mechanics  Visit Diagnosis: Cervicalgia  Abnormal posture  Muscle weakness (generalized)     Problem List Patient Active Problem List   Diagnosis Date Noted  . Neck pain 05/22/2019  . Gross hematuria 05/11/2019  . Candidiasis 05/11/2019  . Staph aureus infection 05/11/2019  . Pulmonary embolism without acute cor pulmonale ([place])   . Uncontrolled type 2 diabetes mellitus with hyperglycemia, with long-term current use of insulin ([place]) 08/04/2017  . Uncontrolled type 2 diabetes mellitus with diabetic polyneuropathy, with long-term current use of insulin ([place]) 08/04/2017  . Hypercalcemia 08/04/2017  . Obesity, Class III, BMI 40-49.9 (morbid obesity) ([place]) 08/04/2017  . Hypokalemia 08/04/2017  . Pulmonary embolism ([place]) 08/03/2017  . Sleep apnea 08/03/2017  . Encounter for screening colonoscopy   . Gastritis without bleeding   . Dysphagia 12/14/2016  . S/P laparoscopic hysterectomy 11/04/2015  . Endometrial cancer ([place]) 10/25/2015  . Diabetes mellitus with neuropathy ([place]) 10/25/2015  . Cancer of endometrium ([place]) 10/18/2015  . Heavy menses 10/11/2015  . COPD exacerbation ([place]) 07/04/2014  .  Acute bronchitis 05/25/2013  . Fever 05/25/2013  . Asthma 05/25/2013  . Chronic obstructive pulmonary disease ([place]) 05/25/2013  . Tobacco abuse 05/25/2013  . Obesity 05/25/2013  . HTN (hypertension), benign 05/25/2013  . Hypercholesteremia 05/25/2013  . Pedal edema 05/25/2013  . Thyromegaly 05/25/2013  . Abdominal pain 04/11/2011  . Constipation 04/11/2011  . GERD (gastroesophageal reflux disease) 04/11/2011   Ihor Austin, LPTA; CBIS [phone]  Aldona Lento 06/12/2019, 10:30 AM  [place], [place], [address] Phone: [phone]   Fax:  [phone]  Name: REBIE LAVOY MRN: [id number] Date of Birth: February 17, 1966

## 2019-06-17 ENCOUNTER — Other Ambulatory Visit: Payer: Self-pay

## 2019-06-17 ENCOUNTER — Ambulatory Visit (HOSPITAL_COMMUNITY): Payer: Medicaid Other | Admitting: Physical Therapy

## 2019-06-17 DIAGNOSIS — M542 Cervicalgia: Secondary | ICD-10-CM | POA: Diagnosis not present

## 2019-06-17 DIAGNOSIS — R293 Abnormal posture: Secondary | ICD-10-CM | POA: Diagnosis not present

## 2019-06-17 DIAGNOSIS — M6281 Muscle weakness (generalized): Secondary | ICD-10-CM | POA: Diagnosis not present

## 2019-06-17 DIAGNOSIS — Z7689 Persons encountering health services in other specified circumstances: Secondary | ICD-10-CM | POA: Diagnosis not present

## 2019-06-17 NOTE — Therapy (Signed)
[place], [place], [address] Phone: [phone]   Fax:  [phone]  Physical Therapy Treatment  Patient Details  Name: Sonya James MRN: [id number] Date of Birth: 07-14-1966 Referring Provider (PT): Benny Lennert    Encounter Date: 06/17/2019  PT End of Session - 06/17/19 1041    Visit Number  3    Number of Visits  12    Date for PT Re-Evaluation  07/17/19    Authorization Type  Medicaid authorization approved 3 visits from 10/01-->06/25/19    Authorization Time Period  9/25-->07/24/19    Authorization - Visit Number  2    Authorization - Number of Visits  3    PT Start Time  1005    PT Stop Time  1045    PT Time Calculation (min)  40 min    Activity Tolerance  Patient tolerated treatment well    Behavior During Therapy  [facility] for tasks assessed/performed       Past Medical History:  Diagnosis Date  . Anemia   . Anxiety   . Arthritis   . Asthma   . Autonomic neuropathy   . Constipation   . COPD (chronic obstructive pulmonary disease) ([place])   . CTS (carpal tunnel syndrome)   . Depression   . Diabetes mellitus without complication ([place])   . Endometrial cancer ([place]) 2017  . GERD (gastroesophageal reflux disease)   . Headache(784.0)   . HTN (hypertension)   . Hypercholesterolemia   . Hypothyroidism   . IBS (irritable bowel syndrome)   . Osteoarthritis    osteoarthritis  . Pancreatitis    per patient   . Recurrent boils    buttocks and low back.  . Shortness of breath   . Sleep apnea    CPAP machine  . Uterine cancer ([place]) 2017  . Vitamin D deficiency     Past Surgical History:  Procedure Laterality Date  . ABDOMINAL HYSTERECTOMY  2018   endometrial cancer, surgery done at [facility]   . BIOPSY  01/02/2017   Procedure: BIOPSY;  Surgeon: Danie Binder, MD;  [facility];  Service: Endoscopy;;  gastric biopsy  . CESAREAN SECTION  1989  . COLONOSCOPY WITH PROPOFOL N/A 01/02/2017   Dr. Oneida Alar: redundant colon, two 2-3 mm polyps in sigmoid colon (hyperplastic)  . ESOPHAGOGASTRODUODENOSCOPY  05/22/2011   Dr. Oneida Alar: H.pylori gastritis   . ESOPHAGOGASTRODUODENOSCOPY (EGD) WITH PROPOFOL N/A 01/02/2017   Dr. 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 FOOT SURGERY Left    tendon repair  . OOPHORECTOMY    . POLYPECTOMY  01/02/2017   Procedure: POLYPECTOMY;  Surgeon: Danie Binder, MD;  [facility];  Service: Endoscopy;;  sigmoid colon polyps times 2  . REDUCTION MAMMAPLASTY  1996  . SAVORY DILATION N/A 01/02/2017   Procedure: SAVORY DILATION;  Surgeon: Danie Binder, MD;  [facility];  Service: Endoscopy;  Laterality: N/A;  . TUBAL LIGATION      There were no vitals filed for this visit.  Subjective Assessment - 06/17/19 1011    Subjective  pt states she cannot tell a differnece.  Her pain is the same and she just "deals with it". Reports compliance with HEP.    Currently in Pain?  Yes    Pain Score  8     Pain Location  Back    Pain Orientation  Upper;Mid;Lower  Pain Descriptors / Indicators  Sharp    Pain Type  Chronic pain                       [facility] Adult PT Treatment/Exercise - 06/17/19 0001      Posture/Postural Control   Posture/Postural Control  Postural limitations    Postural Limitations  Rounded Shoulders;Forward head;Decreased lumbar lordosis      Exercises   Exercises  Neck      Neck Exercises: Seated   Neck Retraction  10 reps;3 secs    Neck Retraction Limitations  verbal/tactile cueing    Cervical Rotation  10 reps;Both    Cervical Rotation Limitations  3D cervical excursion    Lateral Flexion  Both;10 reps    Lateral Flexion Limitations  3D cervical excursion    Shoulder Rolls  Backwards;10 reps    Other Seated Exercise  scapular retraction x 10     Other Seated Exercise  3D thoracic excursion with UE movements      Neck Exercises: Supine    Other Supine Exercise  decompression 1-5 in supine 5X each      Neck Exercises: Stretches   Corner Stretch  3 reps;30 seconds             PT Education - 06/17/19 1040    Education Details  instructed with new decompression therex with rationale.  Educated to only go to comfort and not to try to push through pain.    Person(s) Educated  Patient    Methods  Explanation;Demonstration;Tactile cues;Verbal cues    Comprehension  Verbalized understanding;Returned demonstration;Verbal cues required;Tactile cues required       PT Short Term Goals - 06/06/19 1330      PT SHORT TERM GOAL #1   Title  PT cervical pain to decrease to no greater than a 6/10 to allow pt to sleep for 3 hours at a time.    Time  3    Period  Weeks    Status  New    Target Date  07/03/19      PT SHORT TERM GOAL #2   Title  PT to impreve her cervical rotation by 15 degrees to allow her to have a conversation with a person sitting beside her.    Time  3    Period  Weeks      PT SHORT TERM GOAL #3   Title  PT cervical strength to improve by 1 grade to allow headaches to decrease by 50%        PT Long Term Goals - 06/06/19 1332      PT LONG TERM GOAL #1   Title  Pt cervical pain to be no greater than a 4/10 to allow pt to be able to complete her normal ADL's without increased pain.    Time  6    Period  Weeks    Status  New    Target Date  07/24/19      PT LONG TERM GOAL #2   Title  PT cervical rotation to improve 20 degrees to allow pt to look behind her with ease.    Time  6    Period  Weeks    Status  New      PT LONG TERM GOAL #3   Title  Pt cervical strengh to increase  to at least a 4/5 to allow pt headaches to decrease by 75%.    Time  6  Period  Weeks    Status  New      PT LONG TERM GOAL #4   Title  PT to be displaying increased awareness of proper posture to decrease cervical, thoracic and lumbar pain.    Period  Weeks    Status  New            Plan - 06/17/19 1042     Clinical Impression Statement  continued with exercise instruction and progression.  Pt remains stiff with very guarded movement in her neck.  Cues to move head and follow hand with thoracic excursions as UE added into therex to encourage more UE movmeent.  Pt became tearful with "soreness across chest" while completing thoracic exursions with explanation needed to just go to slight discomfort not pain.  Pt also reluctant to attmept exercise in supine but able to complete added decompression with cues.  PT reported no change in pain at end of session.    Personal Factors and Comorbidities  Behavior Pattern;Comorbidity 3+;Finances;Fitness;Time since onset of injury/illness/exacerbation;Transportation    Comorbidities  OA, chronic knee, back and shoulder pain, HTN, COPD, smoker.    Examination-Activity Limitations  Bathing;Carry;Lift;Reach Overhead;Sleep    Examination-Participation Restrictions  Cleaning;Driving;Laundry;Meal Prep    Stability/Clinical Decision Making  Evolving/Moderate complexity    Rehab Potential  Fair    PT Frequency  2x / week    PT Duration  6 weeks    PT Treatment/Interventions  ADLs/Self Care Home Management;Therapeutic activities;Therapeutic exercise;Patient/family education;Manual techniques;Dry needling    PT Next Visit Plan  Next session begin postural strengthening.  Encourage increased movement and consider adding nustep.    PT Home Exercise Plan  sit tall, scapular retraction, cervical side bend and extension.  06/17/19: decompression 1-5       Patient will benefit from skilled therapeutic intervention in order to improve the following deficits and impairments:  Decreased activity tolerance, Decreased range of motion, Decreased strength, Impaired perceived functional ability, Impaired UE functional use, Postural dysfunction, Pain, Improper body mechanics  Visit Diagnosis: Abnormal posture  Muscle weakness (generalized)  Cervicalgia     Problem List Patient  Active Problem List   Diagnosis Date Noted  . Neck pain 05/22/2019  . Gross hematuria 05/11/2019  . Candidiasis 05/11/2019  . Staph aureus infection 05/11/2019  . Pulmonary embolism without acute cor pulmonale ([place])   . Uncontrolled type 2 diabetes mellitus with hyperglycemia, with long-term current use of insulin ([place]) 08/04/2017  . Uncontrolled type 2 diabetes mellitus with diabetic polyneuropathy, with long-term current use of insulin ([place]) 08/04/2017  . Hypercalcemia 08/04/2017  . Obesity, Class III, BMI 40-49.9 (morbid obesity) ([place]) 08/04/2017  . Hypokalemia 08/04/2017  . Pulmonary embolism ([place]) 08/03/2017  . Sleep apnea 08/03/2017  . Encounter for screening colonoscopy   . Gastritis without bleeding   . Dysphagia 12/14/2016  . S/P laparoscopic hysterectomy 11/04/2015  . Endometrial cancer ([place]) 10/25/2015  . Diabetes mellitus with neuropathy ([place]) 10/25/2015  . Cancer of endometrium ([place]) 10/18/2015  . Heavy menses 10/11/2015  . COPD exacerbation ([place]) 07/04/2014  . Acute bronchitis 05/25/2013  . Fever 05/25/2013  . Asthma 05/25/2013  . Chronic obstructive pulmonary disease ([place]) 05/25/2013  . Tobacco abuse 05/25/2013  . Obesity 05/25/2013  . HTN (hypertension), benign 05/25/2013  . Hypercholesteremia 05/25/2013  . Pedal edema 05/25/2013  . Thyromegaly 05/25/2013  . Abdominal pain 04/11/2011  . Constipation 04/11/2011  . GERD (gastroesophageal reflux disease) 04/11/2011   Teena Irani, PTA/CLT [place], [place] 06/17/2019, 10:48  [place] [address] [place], [place], [address] Phone: [phone]   Fax:  [phone]  Name: Sonya James MRN: [id number] Date of Birth: 02/26/1966

## 2019-06-18 ENCOUNTER — Telehealth (HOSPITAL_COMMUNITY): Payer: Self-pay | Admitting: Physical Therapy

## 2019-06-18 NOTE — Telephone Encounter (Signed)
L/m per Amy to cx the 8:30 and offer 1pm or 2:30 with Otho Ket on Friday. Pt called back to cx due to lack of transporation.

## 2019-06-20 ENCOUNTER — Ambulatory Visit (HOSPITAL_COMMUNITY): Payer: Medicaid Other | Admitting: Physical Therapy

## 2019-06-23 ENCOUNTER — Other Ambulatory Visit: Payer: Self-pay

## 2019-06-23 ENCOUNTER — Encounter (HOSPITAL_COMMUNITY): Payer: Self-pay | Admitting: Physical Therapy

## 2019-06-23 ENCOUNTER — Ambulatory Visit (HOSPITAL_COMMUNITY): Payer: Medicaid Other | Admitting: Physical Therapy

## 2019-06-23 DIAGNOSIS — M542 Cervicalgia: Secondary | ICD-10-CM | POA: Diagnosis not present

## 2019-06-23 DIAGNOSIS — M6281 Muscle weakness (generalized): Secondary | ICD-10-CM

## 2019-06-23 DIAGNOSIS — R293 Abnormal posture: Secondary | ICD-10-CM | POA: Diagnosis not present

## 2019-06-23 DIAGNOSIS — Z7689 Persons encountering health services in other specified circumstances: Secondary | ICD-10-CM | POA: Diagnosis not present

## 2019-06-23 NOTE — Patient Instructions (Addendum)
Strengthening: Extension - Isometric (in Neutral)    Using light pressure from fingertips at back of head, resist bending head backward. Hold ____ seconds. Repeat ____ times per set. Do ____ sets per session. Do ____ sessions per day.  http://orth.exer.us/308   Copyright  VHI. All rights reserved.  Strengthening: Lateral Bend - Isometric (in Neutral)    Using light pressure from, press palm into right temple. Resist bending head sideways. Hold __3-5__ seconds.  Repeat to left Repeat _5-10___ times per set. Do __1__ sets per session. Do _2___ sessions per day.  http://orth.exer.us/302   Copyright  VHI. All rights reserved.

## 2019-06-23 NOTE — Therapy (Signed)
[place], [place], [address] Phone: [phone]   Fax:  [phone]  Physical Therapy Treatment  Patient Details  Name: Sonya James MRN: [id number] Date of Birth: Sep 30, 1965 Referring Provider (PT): Benny Lennert    Encounter Date: 06/23/2019  PT End of Session - 06/23/19 0857    Visit Number  4    Number of Visits  12    Date for PT Re-Evaluation  07/17/19    Authorization Type  Medicaid authorization approved 3 visits from 10/01-->06/25/19    Authorization Time Period  9/25-->07/24/19    Authorization - Visit Number  3    Authorization - Number of Visits  3    PT Start Time  0820    PT Stop Time  0900    PT Time Calculation (min)  40 min    Activity Tolerance  Patient tolerated treatment well    Behavior During Therapy  [facility] for tasks assessed/performed       Past Medical History:  Diagnosis Date  . Anemia   . Anxiety   . Arthritis   . Asthma   . Autonomic neuropathy   . Constipation   . COPD (chronic obstructive pulmonary disease) ([place])   . CTS (carpal tunnel syndrome)   . Depression   . Diabetes mellitus without complication ([place])   . Endometrial cancer ([place]) 2017  . GERD (gastroesophageal reflux disease)   . Headache(784.0)   . HTN (hypertension)   . Hypercholesterolemia   . Hypothyroidism   . IBS (irritable bowel syndrome)   . Osteoarthritis    osteoarthritis  . Pancreatitis    per patient   . Recurrent boils    buttocks and low back.  . Shortness of breath   . Sleep apnea    CPAP machine  . Uterine cancer ([place]) 2017  . Vitamin D deficiency     Past Surgical History:  Procedure Laterality Date  . ABDOMINAL HYSTERECTOMY  2018   endometrial cancer, [facility] done at [facility]   . BIOPSY  01/02/2017   Procedure: BIOPSY;  Surgeon: Danie Binder, MD;  [facility];  Service: Endoscopy;;  gastric biopsy  . CESAREAN SECTION  1989  . COLONOSCOPY WITH PROPOFOL N/A 01/02/2017   Dr. Oneida Alar: redundant colon, two 2-3 mm polyps in sigmoid colon (hyperplastic)  . ESOPHAGOGASTRODUODENOSCOPY  05/22/2011   Dr. Oneida Alar: H.pylori gastritis   . ESOPHAGOGASTRODUODENOSCOPY (EGD) WITH PROPOFOL N/A 01/02/2017   Dr. 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 FOOT SURGERY Left    tendon repair  . OOPHORECTOMY    . POLYPECTOMY  01/02/2017   Procedure: POLYPECTOMY;  Surgeon: Danie Binder, MD;  [facility];  Service: Endoscopy;;  sigmoid colon polyps times 2  . REDUCTION MAMMAPLASTY  1996  . SAVORY DILATION N/A 01/02/2017   Procedure: SAVORY DILATION;  Surgeon: Danie Binder, MD;  [facility];  Service: Endoscopy;  Laterality: N/A;  . TUBAL LIGATION      There were no vitals filed for this visit.  Subjective Assessment - 06/23/19 0822    Subjective  PT states that she is achey today due to the weather.    Pertinent History  OA, DM, HTN, COPD, asthma    How long can you sit comfortably?  not long about 15 minutes    How long can you stand comfortably?  Gets stiff after 15 minutes.  How long can you walk comfortably?  Pt states that she normally works with a cane.  She walks for about 15 minutes and then she needs to sit down more from knees and low back    Patient Stated Goals  I want may back and neck to stop hurting    Currently in Pain?  Yes    Pain Score  8     Pain Location  Neck    Pain Orientation  Lower;Mid    Pain Descriptors / Indicators  Aching    Pain Type  Chronic pain    Pain Radiating Towards  shoulder blades    Pain Onset  More than a month ago    Pain Frequency  Constant    Aggravating Factors   activity    Pain Relieving Factors  BC's         [facility] PT Assessment - 06/23/19 0001      Assessment   Medical Diagnosis  cervical pain     Referring Provider (PT)  Benny Lennert     Onset Date/Surgical Date  11/10/26    Hand Dominance  Right    Next MD Visit  10/10.2020    Prior  Therapy  none       Precautions   Precautions  None      Restrictions   Weight Bearing Restrictions  No      Home Environment   Living Environment  Other (Comment)      Prior Function   Level of Independence  Independent with basic ADLs      Cognition   Overall Cognitive Status  Within Functional Limits for tasks assessed      Observation/Other Assessments   Other Surveys   Oswestry Disability Index    Oswestry Disability Index   least pain 8/10; mostg 10, noww 10    Now 8/10; low 8/10/ high 8/10  average 8/10 ; average 9.5     Posture/Postural Control   Posture/Postural Control  Postural limitations    Postural Limitations  Rounded Shoulders;Forward head;Decreased lumbar lordosis      AROM   Cervical Extension  45   was 45   Cervical - Right Side Bend  45   was 45   Cervical - Left Side Bend  45   was 45   Cervical - Right Rotation  55   was 40    Cervical - Left Rotation  65   was 60     Strength   Cervical Extension  2/5   was 2-/5    Cervical - Right Side Bend  2/5   was 2-   Cervical - Left Side Bend  2/5   was 2-                  [facility] Adult PT Treatment/Exercise - 06/23/19 0001      Exercises   Exercises  Neck      Neck Exercises: Seated   Cervical Isometrics  Extension;Right lateral flexion;Left lateral flexion;3 secs;5 reps    Neck Retraction  10 reps;3 secs    Neck Retraction Limitations  verbal/tactile cueing    Cervical Rotation  10 reps;Both    Lateral Flexion  Both;10 reps    X to V  5 reps    W Back  10 reps    Shoulder Rolls  Backwards;10 reps    Other Seated Exercise  scapular retraction x 10     Other Seated Exercise  3D thoracic excursion  with UE movements      Neck Exercises: Supine   Other Supine Exercise  decompression 1-5 in supine 5X each      Manual Therapy   Manual Therapy  Soft tissue mobilization    Manual therapy comments  done seperate from all other aspects of treatmeant     Soft tissue mobilization   moderate spasm but pt unable to tolerate manual       Neck Exercises: Stretches   Corner Stretch  3 reps;30 seconds               PT Short Term Goals - 06/23/19 0857      PT SHORT TERM GOAL #1   Title  PT cervical pain to decrease to no greater than a 6/10 to allow pt to sleep for 3 hours at a time.    Time  3    Period  Weeks    Status  On-going    Target Date  07/03/19      PT SHORT TERM GOAL #2   Title  PT to impreve her cervical rotation by 15 degrees to allow her to have a conversation with a person sitting beside her.    Time  3    Period  Weeks    Status  On-going      PT SHORT TERM GOAL #3   Title  PT cervical strength to improve by 1 grade to allow headaches to decrease by 50%    Status  On-going        PT Long Term Goals - 06/23/19 0858      PT LONG TERM GOAL #1   Title  Pt cervical pain to be no greater than a 4/10 to allow pt to be able to complete her normal ADL's without increased pain.    Time  6    Period  Weeks    Status  On-going      PT LONG TERM GOAL #2   Title  PT cervical rotation to improve 20 degrees to allow pt to look behind her with ease.    Time  6    Period  Weeks    Status  On-going      PT LONG TERM GOAL #3   Title  Pt cervical strengh to increase  to at least a 4/5 to allow pt headaches to decrease by 75%.    Time  6    Period  Weeks    Status  On-going      PT LONG TERM GOAL #4   Title  PT to be displaying increased awareness of proper posture to decrease cervical, thoracic and lumbar pain.    Period  Weeks    Status  On-going              Patient will benefit from skilled therapeutic intervention in order to improve the following deficits and impairments:     Visit Diagnosis: Abnormal posture  Muscle weakness (generalized)  Cervicalgia     Problem List Patient Active Problem List   Diagnosis Date Noted  . Neck pain 05/22/2019  . Gross hematuria 05/11/2019  . Candidiasis 05/11/2019  . Staph  aureus infection 05/11/2019  . Pulmonary embolism without acute cor pulmonale ([place])   . Uncontrolled type 2 diabetes mellitus with hyperglycemia, with long-term current use of insulin ([place]) 08/04/2017  . Uncontrolled type 2 diabetes mellitus with diabetic polyneuropathy, with long-term current use of insulin ([place]) 08/04/2017  . Hypercalcemia 08/04/2017  . Obesity,  Class III, BMI 40-49.9 (morbid obesity) ([place]) 08/04/2017  . Hypokalemia 08/04/2017  . Pulmonary embolism ([place]) 08/03/2017  . Sleep apnea 08/03/2017  . Encounter for screening colonoscopy   . Gastritis without bleeding   . Dysphagia 12/14/2016  . S/P laparoscopic hysterectomy 11/04/2015  . Endometrial cancer ([place]) 10/25/2015  . Diabetes mellitus with neuropathy ([place]) 10/25/2015  . Cancer of endometrium ([place]) 10/18/2015  . Heavy menses 10/11/2015  . COPD exacerbation ([place]) 07/04/2014  . Acute bronchitis 05/25/2013  . Fever 05/25/2013  . Asthma 05/25/2013  . Chronic obstructive pulmonary disease ([place]) 05/25/2013  . Tobacco abuse 05/25/2013  . Obesity 05/25/2013  . HTN (hypertension), benign 05/25/2013  . Hypercholesteremia 05/25/2013  . Pedal edema 05/25/2013  . Thyromegaly 05/25/2013  . Abdominal pain 04/11/2011  . Constipation 04/11/2011  . GERD (gastroesophageal reflux disease) 04/11/2011    Rayetta Humphrey, PT CLT [phone] 06/23/2019, 9:04 AM  [facility] [place] [address] [place], [place], [address] Phone: [phone]   Fax:  [phone]  Name: Sonya James MRN: [id number] Date of Birth: November 14, 1965

## 2019-06-24 ENCOUNTER — Ambulatory Visit: Payer: Medicaid Other | Admitting: Orthopaedic Surgery

## 2019-06-24 ENCOUNTER — Encounter: Payer: Self-pay | Admitting: Orthopaedic Surgery

## 2019-06-24 DIAGNOSIS — M25562 Pain in left knee: Secondary | ICD-10-CM | POA: Diagnosis not present

## 2019-06-24 DIAGNOSIS — Z7689 Persons encountering health services in other specified circumstances: Secondary | ICD-10-CM | POA: Diagnosis not present

## 2019-06-24 DIAGNOSIS — F17210 Nicotine dependence, cigarettes, uncomplicated: Secondary | ICD-10-CM

## 2019-06-24 DIAGNOSIS — M25561 Pain in right knee: Secondary | ICD-10-CM

## 2019-06-24 DIAGNOSIS — G8929 Other chronic pain: Secondary | ICD-10-CM | POA: Diagnosis not present

## 2019-06-24 MED ORDER — HYDROCODONE-ACETAMINOPHEN 5-325 MG PO TABS: ORAL_TABLET | ORAL | 0 refills | Status: DC

## 2019-06-24 NOTE — Progress Notes (Signed)
CC: Both of my knees are hurting. I would like an injection in both knees.  The patient has had chronic pain and tenderness of both knees for some time.  Injections help.  There is no locking or giving way of the knee.  There is no new trauma. There is no redness or signs of infections.  The knees have a mild effusion and some crepitus.  There is no redness or signs of recent trauma.  Right knee ROM is 0-100 and left knee ROM is 0-105.  Impression:  Chronic pain of the both knees  Return:  2 months  PROCEDURE NOTE:  The patient requests injections of both knees, verbal consent was obtained.  The left and right knee were individually prepped appropriately after time out was performed.   Sterile technique was observed and injection of 1 cc of Depo-Medrol 40 mg with several cc's of plain xylocaine. Anesthesia was provided by ethyl chloride and a 20-gauge needle was used to inject each knee area. The injections were tolerated well.  A band aid dressing was applied.  The patient was advised to apply ice later today and tomorrow to the injection sight as needed.   I have reviewed the [facility] web site prior to prescribing narcotic medicine for this patient.   Electronically Signed Sanjuana Kava, MD 10/13/202010:36 AM

## 2019-06-25 ENCOUNTER — Ambulatory Visit (HOSPITAL_COMMUNITY): Payer: Medicaid Other | Admitting: Physical Therapy

## 2019-06-25 ENCOUNTER — Telehealth (HOSPITAL_COMMUNITY): Payer: Self-pay | Admitting: Physical Therapy

## 2019-06-25 NOTE — Telephone Encounter (Signed)
Called pt and cancelled appointment secondary to insurance not authorizing at this time.   Rayetta Humphrey, [place] CLT [phone]

## 2019-07-01 ENCOUNTER — Other Ambulatory Visit: Payer: Self-pay

## 2019-07-01 ENCOUNTER — Encounter (HOSPITAL_COMMUNITY): Payer: Self-pay | Admitting: Physical Therapy

## 2019-07-01 ENCOUNTER — Ambulatory Visit (HOSPITAL_COMMUNITY): Payer: Medicaid Other | Admitting: Physical Therapy

## 2019-07-01 DIAGNOSIS — M542 Cervicalgia: Secondary | ICD-10-CM

## 2019-07-01 DIAGNOSIS — R293 Abnormal posture: Secondary | ICD-10-CM | POA: Diagnosis not present

## 2019-07-01 DIAGNOSIS — Z7689 Persons encountering health services in other specified circumstances: Secondary | ICD-10-CM | POA: Diagnosis not present

## 2019-07-01 DIAGNOSIS — M6281 Muscle weakness (generalized): Secondary | ICD-10-CM | POA: Diagnosis not present

## 2019-07-01 NOTE — Therapy (Signed)
[place], [place], [address] Phone: [phone]   Fax:  [phone]  Physical Therapy Treatment  Patient Details  Name: Sonya James MRN: [id number] Date of Birth: 09-Jan-1966 Referring Provider (PT): Benny Lennert    Encounter Date: 07/01/2019  PT End of Session - 07/01/19 1042    Visit Number  5    Number of Visits  12    Date for PT Re-Evaluation  07/17/19    Authorization Type  Medicaid authorization approved 3 visits from 10/01-->06/25/19; 12 visits approved 10/19 to 08/10/19    Authorization Time Period  9/25-->07/24/19    Authorization - Visit Number  1    Authorization - Number of Visits  12    PT Start Time  0950    PT Stop Time  1030    PT Time Calculation (min)  40 min    Activity Tolerance  Patient tolerated treatment well    Behavior During Therapy  [facility] for tasks assessed/performed       Past Medical History:  Diagnosis Date  . Anemia   . Anxiety   . Arthritis   . Asthma   . Autonomic neuropathy   . Constipation   . COPD (chronic obstructive pulmonary disease) ([place])   . CTS (carpal tunnel syndrome)   . Depression   . Diabetes mellitus without complication ([place])   . Endometrial cancer ([place]) 2017  . GERD (gastroesophageal reflux disease)   . Headache(784.0)   . HTN (hypertension)   . Hypercholesterolemia   . Hypothyroidism   . IBS (irritable bowel syndrome)   . Osteoarthritis    osteoarthritis  . Pancreatitis    per patient   . Recurrent boils    buttocks and low back.  . Shortness of breath   . Sleep apnea    CPAP machine  . Uterine cancer ([place]) 2017  . Vitamin D deficiency     Past Surgical History:  Procedure Laterality Date  . ABDOMINAL HYSTERECTOMY  2018   endometrial cancer, surgery done at [facility]   . BIOPSY  01/02/2017   Procedure: BIOPSY;  Surgeon: Danie Binder, MD;  [facility];  Service: Endoscopy;;  gastric biopsy  . CESAREAN SECTION  1989  .  COLONOSCOPY WITH PROPOFOL N/A 01/02/2017   Dr. Oneida Alar: redundant colon, two 2-3 mm polyps in sigmoid colon (hyperplastic)  . ESOPHAGOGASTRODUODENOSCOPY  05/22/2011   Dr. Oneida Alar: H.pylori gastritis   . ESOPHAGOGASTRODUODENOSCOPY (EGD) WITH PROPOFOL N/A 01/02/2017   Dr. 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 FOOT SURGERY Left    tendon repair  . OOPHORECTOMY    . POLYPECTOMY  01/02/2017   Procedure: POLYPECTOMY;  Surgeon: Danie Binder, MD;  [facility];  Service: Endoscopy;;  sigmoid colon polyps times 2  . REDUCTION MAMMAPLASTY  1996  . SAVORY DILATION N/A 01/02/2017   Procedure: SAVORY DILATION;  Surgeon: Danie Binder, MD;  [facility];  Service: Endoscopy;  Laterality: N/A;  . TUBAL LIGATION      There were no vitals filed for this visit.  Subjective Assessment - 07/01/19 0954    Subjective  Patient says she has been feeling good. Patient says she did well with exercise from last visit. Patient says she was a little sore from soft tissue work    Pertinent History  OA, DM, HTN, COPD, asthma    How long can you sit comfortably?  not long about 15 minutes    How long can you stand comfortably?  Gets stiff after 15 minutes.    How long can you walk comfortably?  Pt states that she normally works with a cane.  She walks for about 15 minutes and then she needs to sit down more from knees and low back    Patient Stated Goals  I want may back and neck to stop hurting    Currently in Pain?  Yes    Pain Score  8     Pain Location  Neck    Pain Orientation  Posterior    Pain Descriptors / Indicators  Aching;Sore    Pain Onset  More than a month ago                       [facility] Adult PT Treatment/Exercise - 07/01/19 0001      Neck Exercises: Seated   Cervical Isometrics  Extension;Right lateral flexion;Left lateral flexion;3 secs;5 reps    Neck Retraction  10 reps;3 secs    Neck Retraction  Limitations  verbal cueing    Cervical Rotation  10 reps;Both    Lateral Flexion  Both;10 reps    X to V  10 reps    W Back  10 reps    Shoulder Rolls  Backwards;20 reps    Other Seated Exercise  scapular retraction x 10     Other Seated Exercise  3D thoracic excursion with UE movements      Manual Therapy   Manual Therapy  Soft tissue mobilization;Manual Traction    Manual therapy comments  done seperate from all other aspects of treatmeant     Soft tissue mobilization  STM to bilateral cervical paraspinals and upper traps, patient in supine    Manual Traction  gentle manual traction to cervical spine, patient in supine               PT Short Term Goals - 06/23/19 0857      PT SHORT TERM GOAL #1   Title  PT cervical pain to decrease to no greater than a 6/10 to allow pt to sleep for 3 hours at a time.    Time  3    Period  Weeks    Status  On-going    Target Date  07/03/19      PT SHORT TERM GOAL #2   Title  PT to impreve her cervical rotation by 15 degrees to allow her to have a conversation with a person sitting beside her.    Time  3    Period  Weeks    Status  On-going      PT SHORT TERM GOAL #3   Title  PT cervical strength to improve by 1 grade to allow headaches to decrease by 50%    Status  On-going        PT Long Term Goals - 06/23/19 0858      PT LONG TERM GOAL #1   Title  Pt cervical pain to be no greater than a 4/10 to allow pt to be able to complete her normal ADL's without increased pain.    Time  6    Period  Weeks    Status  On-going      PT LONG TERM GOAL #2   Title  PT cervical rotation to improve 20 degrees to allow pt to look behind her with ease.    Time  6    Period  Weeks    Status  On-going      PT LONG TERM GOAL #3   Title  Pt cervical strengh to increase  to at least a 4/5 to allow pt headaches to decrease by 75%.    Time  6    Period  Weeks    Status  On-going      PT LONG TERM GOAL #4   Title  PT to be displaying  increased awareness of proper posture to decrease cervical, thoracic and lumbar pain.    Period  Weeks    Status  On-going            Plan - 07/01/19 1043    Clinical Impression Statement  Patient tolerated treatment well today. Initiated session with gentle manual traction and STM to cervical and scapular musculature. Patient tolerated this well, but with noted tenderness about LT cervical paraspinals. Patient showed good carry over of previous ther ex, and stated she has been doing these exercises regularly with HEP as instructed. Patient did require verbal cues for limiting range to pain free with cervical retractions and cervical ROM. Patient reported decreased pain to 7/10 and noted improved cervical mobility post session.    Personal Factors and Comorbidities  Behavior Pattern;Comorbidity 3+;Finances;Fitness;Time since onset of injury/illness/exacerbation;Transportation    Comorbidities  OA, chronic knee, back and shoulder pain, HTN, COPD, smoker.    Examination-Activity Limitations  Bathing;Carry;Lift;Reach Overhead;Sleep    Examination-Participation Restrictions  Cleaning;Driving;Laundry;Meal Prep    Stability/Clinical Decision Making  Evolving/Moderate complexity    Rehab Potential  Fair    PT Frequency  2x / week    PT Duration  6 weeks    PT Treatment/Interventions  ADLs/Self Care Home Management;Therapeutic activities;Therapeutic exercise;Patient/family education;Manual techniques;Dry needling    PT Next Visit Plan  Progress scapular strengthening as tolerated. Continue manual therapy to address mobility restrictions.    PT Home Exercise Plan  sit tall, scapular retraction, cervical side bend and extension.  06/17/19: decompression 1-5       Patient will benefit from skilled therapeutic intervention in order to improve the following deficits and impairments:  Decreased activity tolerance, Decreased range of motion, Decreased strength, Impaired perceived functional ability,  Impaired UE functional use, Postural dysfunction, Pain, Improper body mechanics  Visit Diagnosis: Abnormal posture  Muscle weakness (generalized)  Cervicalgia     Problem List Patient Active Problem List   Diagnosis Date Noted  . Neck pain 05/22/2019  . Gross hematuria 05/11/2019  . Candidiasis 05/11/2019  . Staph aureus infection 05/11/2019  . Pulmonary embolism without acute cor pulmonale ([place])   . Uncontrolled type 2 diabetes mellitus with hyperglycemia, with long-term current use of insulin ([place]) 08/04/2017  . Uncontrolled type 2 diabetes mellitus with diabetic polyneuropathy, with long-term current use of insulin ([place]) 08/04/2017  . Hypercalcemia 08/04/2017  . Obesity, Class III, BMI 40-49.9 (morbid obesity) ([place]) 08/04/2017  . Hypokalemia 08/04/2017  . Pulmonary embolism ([place]) 08/03/2017  . Sleep apnea 08/03/2017  . Encounter for screening colonoscopy   . Gastritis without bleeding   . Dysphagia 12/14/2016  . S/P laparoscopic hysterectomy 11/04/2015  . Endometrial cancer ([place]) 10/25/2015  . Diabetes mellitus with neuropathy ([place]) 10/25/2015  . Cancer of endometrium ([place]) 10/18/2015  . Heavy menses 10/11/2015  . COPD exacerbation ([place]) 07/04/2014  . Acute bronchitis 05/25/2013  . Fever 05/25/2013  . Asthma 05/25/2013  . Chronic obstructive pulmonary disease ([place]) 05/25/2013  . Tobacco abuse 05/25/2013  . Obesity 05/25/2013  .  HTN (hypertension), benign 05/25/2013  . Hypercholesteremia 05/25/2013  . Pedal edema 05/25/2013  . Thyromegaly 05/25/2013  . Abdominal pain 04/11/2011  . Constipation 04/11/2011  . GERD (gastroesophageal reflux disease) 04/11/2011    Elizbeth Squires PT DPT 07/01/2019, 10:49 AM  [place], [place], [address] Phone: [phone]   Fax:  [phone]  Name: MEHREEN GUAMAN MRN: [id number] Date of Birth: [birth year]

## 2019-07-03 ENCOUNTER — Ambulatory Visit (HOSPITAL_COMMUNITY): Payer: Medicaid Other | Admitting: Physical Therapy

## 2019-07-03 ENCOUNTER — Telehealth (HOSPITAL_COMMUNITY): Payer: Self-pay | Admitting: Physical Therapy

## 2019-07-03 NOTE — Telephone Encounter (Signed)
Pt did not show for appointment.  Message left with reminder for next appointment and to cancel if she could not make it.   Teena Irani, PTA/CLT [phone]

## 2019-07-07 ENCOUNTER — Ambulatory Visit (HOSPITAL_COMMUNITY): Payer: Medicaid Other | Admitting: Physical Therapy

## 2019-07-07 ENCOUNTER — Other Ambulatory Visit: Payer: Self-pay

## 2019-07-07 DIAGNOSIS — R293 Abnormal posture: Secondary | ICD-10-CM | POA: Diagnosis not present

## 2019-07-07 DIAGNOSIS — M6281 Muscle weakness (generalized): Secondary | ICD-10-CM | POA: Diagnosis not present

## 2019-07-07 DIAGNOSIS — M542 Cervicalgia: Secondary | ICD-10-CM | POA: Diagnosis not present

## 2019-07-07 DIAGNOSIS — Z7689 Persons encountering health services in other specified circumstances: Secondary | ICD-10-CM | POA: Diagnosis not present

## 2019-07-07 NOTE — Therapy (Signed)
[place], [place], [address] Phone: [phone]   Fax:  [phone]  Physical Therapy Treatment  Patient Details  Name: Sonya James MRN: [id number] Date of Birth: 1965/11/05 Referring Provider (PT): Benny Lennert    Encounter Date: 07/07/2019  PT End of Session - 07/07/19 0913    Visit Number  6    Number of Visits  12    Date for PT Re-Evaluation  07/17/19    Authorization Type  Medicaid authorization approved 3 visits from 10/01-->06/25/19; 12 visits approved 10/19 to 08/10/19    Authorization Time Period  9/25-->07/24/19    Authorization - Visit Number  2    Authorization - Number of Visits  12    PT Start Time  0830    PT Stop Time  0910    PT Time Calculation (min)  40 min    Activity Tolerance  Patient tolerated treatment well    Behavior During Therapy  [facility] for tasks assessed/performed       Past Medical History:  Diagnosis Date  . Anemia   . Anxiety   . Arthritis   . Asthma   . Autonomic neuropathy   . Constipation   . COPD (chronic obstructive pulmonary disease) ([place])   . CTS (carpal tunnel syndrome)   . Depression   . Diabetes mellitus without complication ([place])   . Endometrial cancer ([place]) 2017  . GERD (gastroesophageal reflux disease)   . Headache(784.0)   . HTN (hypertension)   . Hypercholesterolemia   . Hypothyroidism   . IBS (irritable bowel syndrome)   . Osteoarthritis    osteoarthritis  . Pancreatitis    per patient   . Recurrent boils    buttocks and low back.  . Shortness of breath   . Sleep apnea    CPAP machine  . Uterine cancer ([place]) 2017  . Vitamin D deficiency     Past Surgical History:  Procedure Laterality Date  . ABDOMINAL HYSTERECTOMY  2018   endometrial cancer, surgery done at [facility]   . BIOPSY  01/02/2017   Procedure: BIOPSY;  Surgeon: Danie Binder, MD;  [facility];  Service: Endoscopy;;  gastric biopsy  . CESAREAN SECTION  1989  .  COLONOSCOPY WITH PROPOFOL N/A 01/02/2017   Dr. Oneida Alar: redundant colon, two 2-3 mm polyps in sigmoid colon (hyperplastic)  . ESOPHAGOGASTRODUODENOSCOPY  05/22/2011   Dr. Oneida Alar: H.pylori gastritis   . ESOPHAGOGASTRODUODENOSCOPY (EGD) WITH PROPOFOL N/A 01/02/2017   Dr. 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 FOOT SURGERY Left    tendon repair  . OOPHORECTOMY    . POLYPECTOMY  01/02/2017   Procedure: POLYPECTOMY;  Surgeon: Danie Binder, MD;  [facility];  Service: Endoscopy;;  sigmoid colon polyps times 2  . REDUCTION MAMMAPLASTY  1996  . SAVORY DILATION N/A 01/02/2017   Procedure: SAVORY DILATION;  Surgeon: Danie Binder, MD;  [facility];  Service: Endoscopy;  Laterality: N/A;  . TUBAL LIGATION      There were no vitals filed for this visit.  Subjective Assessment - 07/07/19 0835    Subjective  pt states her neck felt better after it "popped" in therapy last session.  States the transportation Champ did not show up last session and that is why she did not show.  STates her neck feels better but still hurting at 7/10 today.    Currently  in Pain?  Yes    Pain Score  7     Pain Location  Neck    Pain Orientation  Mid;Posterior    Pain Descriptors / Indicators  Aching;Burning;Tightness    Pain Radiating Towards  burning into her shoulder blades                       [facility] Adult PT Treatment/Exercise - 07/07/19 0001      Neck Exercises: Standing   Upper Extremity Flexion with Stabilization  10 reps;Limitations    UE Flexion with Stabilization Limitations  against wall      Neck Exercises: Seated   Neck Retraction  10 reps;3 secs    W Back  15 reps    Shoulder Rolls  Backwards;20 reps    Other Seated Exercise  3D thoracic excursion with UE movements      Neck Exercises: Supine   Other Supine Exercise  decompression 1-5 in supine 5X each      Manual Therapy   Manual Therapy  Soft  tissue mobilization;Manual Traction    Manual therapy comments  done seperate from all other aspects of treatmeant     Soft tissue mobilization  STM to bilateral cervical paraspinals and upper traps, patient in supine    Manual Traction  gentle manual traction to cervical spine, patient in supine      Neck Exercises: Stretches   Corner Stretch  3 reps;30 seconds               PT Short Term Goals - 06/23/19 0857      PT SHORT TERM GOAL #1   Title  PT cervical pain to decrease to no greater than a 6/10 to allow pt to sleep for 3 hours at a time.    Time  3    Period  Weeks    Status  On-going    Target Date  07/03/19      PT SHORT TERM GOAL #2   Title  PT to impreve her cervical rotation by 15 degrees to allow her to have a conversation with a person sitting beside her.    Time  3    Period  Weeks    Status  On-going      PT SHORT TERM GOAL #3   Title  PT cervical strength to improve by 1 grade to allow headaches to decrease by 50%    Status  On-going        PT Long Term Goals - 06/23/19 0858      PT LONG TERM GOAL #1   Title  Pt cervical pain to be no greater than a 4/10 to allow pt to be able to complete her normal ADL's without increased pain.    Time  6    Period  Weeks    Status  On-going      PT LONG TERM GOAL #2   Title  PT cervical rotation to improve 20 degrees to allow pt to look behind her with ease.    Time  6    Period  Weeks    Status  On-going      PT LONG TERM GOAL #3   Title  Pt cervical strengh to increase  to at least a 4/5 to allow pt headaches to decrease by 75%.    Time  6    Period  Weeks    Status  On-going      PT LONG TERM  GOAL #4   Title  PT to be displaying increased awareness of proper posture to decrease cervical, thoracic and lumbar pain.    Period  Weeks    Status  On-going            Plan - 07/07/19 [id number]    Clinical Impression Statement  pt returns today with noted relief from last session.  Continued with therex  with added UE flexion against wall working on postural stabiltiy and UE stretch/ROM.  Pt requires min cues with therex for form and no difficulties/complaints while completing therex.  Pt with difficulty fully relaxing with manual requiring cues and minimal tightness noted in upper traps this session.    Personal Factors and Comorbidities  Behavior Pattern;Comorbidity 3+;Finances;Fitness;Time since onset of injury/illness/exacerbation;Transportation    Comorbidities  OA, chronic knee, back and shoulder pain, HTN, COPD, smoker.    Examination-Activity Limitations  Bathing;Carry;Lift;Reach Overhead;Sleep    Examination-Participation Restrictions  Cleaning;Driving;Laundry;Meal Prep    Stability/Clinical Decision Making  Evolving/Moderate complexity    Rehab Potential  Fair    PT Frequency  2x / week    PT Duration  6 weeks    PT Treatment/Interventions  ADLs/Self Care Home Management;Therapeutic activities;Therapeutic exercise;Patient/family education;Manual techniques;Dry needling    PT Next Visit Plan  Progress scapular strengthening as tolerated. Continue manual therapy to address mobility restrictions. Add theraband postural sterngthening next session.    PT Home Exercise Plan  sit tall, scapular retraction, cervical side bend and extension.  06/17/19: decompression 1-5       Patient will benefit from skilled therapeutic intervention in order to improve the following deficits and impairments:  Decreased activity tolerance, Decreased range of motion, Decreased strength, Impaired perceived functional ability, Impaired UE functional use, Postural dysfunction, Pain, Improper body mechanics  Visit Diagnosis: Abnormal posture  Muscle weakness (generalized)  Cervicalgia     Problem List Patient Active Problem List   Diagnosis Date Noted  . Neck pain 05/22/2019  . Gross hematuria 05/11/2019  . Candidiasis 05/11/2019  . Staph aureus infection 05/11/2019  . Pulmonary embolism without acute  cor pulmonale ([place])   . Uncontrolled type 2 diabetes mellitus with hyperglycemia, with long-term current use of insulin ([place]) 08/04/2017  . Uncontrolled type 2 diabetes mellitus with diabetic polyneuropathy, with long-term current use of insulin ([place]) 08/04/2017  . Hypercalcemia 08/04/2017  . Obesity, Class III, BMI 40-49.9 (morbid obesity) ([place]) 08/04/2017  . Hypokalemia 08/04/2017  . Pulmonary embolism ([place]) 08/03/2017  . Sleep apnea 08/03/2017  . Encounter for screening colonoscopy   . Gastritis without bleeding   . Dysphagia 12/14/2016  . S/P laparoscopic hysterectomy 11/04/2015  . Endometrial cancer ([place]) 10/25/2015  . Diabetes mellitus with neuropathy ([place]) 10/25/2015  . Cancer of endometrium ([place]) 10/18/2015  . Heavy menses 10/11/2015  . COPD exacerbation ([place]) 07/04/2014  . Acute bronchitis 05/25/2013  . Fever 05/25/2013  . Asthma 05/25/2013  . Chronic obstructive pulmonary disease ([place]) 05/25/2013  . Tobacco abuse 05/25/2013  . Obesity 05/25/2013  . HTN (hypertension), benign 05/25/2013  . Hypercholesteremia 05/25/2013  . Pedal edema 05/25/2013  . Thyromegaly 05/25/2013  . Abdominal pain 04/11/2011  . Constipation 04/11/2011  . GERD (gastroesophageal reflux disease) 04/11/2011   Teena Irani, PTA/CLT [phone]  Teena Irani 07/07/2019, 9:24 AM  [place], [place], [address] Phone: [phone]   Fax:  [phone]  Name: Sonya James MRN: [id number] Date of Birth: 1966-03-18

## 2019-07-10 ENCOUNTER — Encounter (HOSPITAL_COMMUNITY): Payer: Self-pay | Admitting: Physical Therapy

## 2019-07-10 ENCOUNTER — Other Ambulatory Visit: Payer: Self-pay

## 2019-07-10 ENCOUNTER — Ambulatory Visit (HOSPITAL_COMMUNITY): Payer: Medicaid Other | Admitting: Physical Therapy

## 2019-07-10 DIAGNOSIS — M542 Cervicalgia: Secondary | ICD-10-CM

## 2019-07-10 DIAGNOSIS — R293 Abnormal posture: Secondary | ICD-10-CM | POA: Diagnosis not present

## 2019-07-10 DIAGNOSIS — M6281 Muscle weakness (generalized): Secondary | ICD-10-CM | POA: Diagnosis not present

## 2019-07-10 DIAGNOSIS — Z7689 Persons encountering health services in other specified circumstances: Secondary | ICD-10-CM | POA: Diagnosis not present

## 2019-07-10 NOTE — Therapy (Signed)
[place], [place], [address] Phone: [phone]   Fax:  [phone]  Physical Therapy Treatment  Patient Details  Name: Sonya James MRN: [id number] Date of Birth: 09/29/1965 Referring Provider (PT): Benny Lennert    Encounter Date: 07/10/2019  PT End of Session - 07/10/19 1028    Visit Number  7    Number of Visits  12    Date for PT Re-Evaluation  07/17/19    Authorization Type  Medicaid authorization approved 3 visits from 10/01-->06/25/19; 12 visits approved 10/19 to 08/10/19    Authorization Time Period  9/25-->07/24/19    Authorization - Visit Number  7    Authorization - Number of Visits  12    PT Start Time  1005    PT Stop Time  1045    PT Time Calculation (min)  40 min    Activity Tolerance  Patient tolerated treatment well    Behavior During Therapy  [facility] for tasks assessed/performed       Past Medical History:  Diagnosis Date  . Anemia   . Anxiety   . Arthritis   . Asthma   . Autonomic neuropathy   . Constipation   . COPD (chronic obstructive pulmonary disease) ([place])   . CTS (carpal tunnel syndrome)   . Depression   . Diabetes mellitus without complication ([place])   . Endometrial cancer ([place]) 2017  . GERD (gastroesophageal reflux disease)   . Headache(784.0)   . HTN (hypertension)   . Hypercholesterolemia   . Hypothyroidism   . IBS (irritable bowel syndrome)   . Osteoarthritis    osteoarthritis  . Pancreatitis    per patient   . Recurrent boils    buttocks and low back.  . Shortness of breath   . Sleep apnea    CPAP machine  . Uterine cancer ([place]) 2017  . Vitamin D deficiency     Past Surgical History:  Procedure Laterality Date  . ABDOMINAL HYSTERECTOMY  2018   endometrial cancer, surgery done at [facility]   . BIOPSY  01/02/2017   Procedure: BIOPSY;  Surgeon: Danie Binder, MD;  [facility];  Service: Endoscopy;;  gastric biopsy  . CESAREAN SECTION  1989  .  COLONOSCOPY WITH PROPOFOL N/A 01/02/2017   Dr. Oneida Alar: redundant colon, two 2-3 mm polyps in sigmoid colon (hyperplastic)  . ESOPHAGOGASTRODUODENOSCOPY  05/22/2011   Dr. Oneida Alar: H.pylori gastritis   . ESOPHAGOGASTRODUODENOSCOPY (EGD) WITH PROPOFOL N/A 01/02/2017   Dr. 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 FOOT SURGERY Left    tendon repair  . OOPHORECTOMY    . POLYPECTOMY  01/02/2017   Procedure: POLYPECTOMY;  Surgeon: Danie Binder, MD;  Location: [facility];  Service: Endoscopy;;  sigmoid colon polyps times 2  . REDUCTION MAMMAPLASTY  1996  . SAVORY DILATION N/A 01/02/2017   Procedure: SAVORY DILATION;  Surgeon: Danie Binder, MD;  Location: [facility] END[facility];  Service: Endoscopy;  Laterality: N/A;  . TUBAL LIGATION      There were no vitals filed for this visit.  Subjective Assessment - 07/10/19 1012    Subjective  Pt is completing her exercise everyday; her back is not as tight and her neck is not bothering her as much.    Pertinent History  OA, DM, HTN, COPD, asthma    How long can you sit comfortably?  not long about 15 minutes  How long can you stand comfortably?  Gets stiff after 15 minutes.    How long can you walk comfortably?  Pt states that she normally works with a cane.  She walks for about 15 minutes and then she needs to sit down more from knees and low back    Patient Stated Goals  I want may back and neck to stop hurting    Currently in Pain?  No/denies    Pain Onset  More than a month ago                       [facility] Adult PT Treatment/Exercise - 07/10/19 0001      Exercises   Exercises  Neck      Neck Exercises: Theraband   Scapula Retraction  10 reps;Green    Shoulder Extension  10 reps;Green    Rows  10 reps;Green      Neck Exercises: Standing   Neck Retraction  10 reps    Wall Push Ups  5 reps    Upper Extremity Flexion with Stabilization  10 reps;Limitations    UE Flexion with  Stabilization Limitations  against wall      Neck Exercises: Seated   W Back  10 reps;Weight    W Back Weights (lbs)  2    Money  5 reps    Other Seated Exercise  3D thoracic excursion with UE movements      Manual Therapy   Manual Therapy  Soft tissue mobilization;Manual Traction    Manual therapy comments  done seperate from all other aspects of treatmeant     Soft tissue mobilization  STM to bilateral cervical paraspinals and upper traps, patient in supine    Manual Traction  gentle manual traction to cervical spine, patient in supine               PT Short Term Goals - 06/23/19 0857      PT SHORT TERM GOAL #1   Title  PT cervical pain to decrease to no greater than a 6/10 to allow pt to sleep for 3 hours at a time.    Time  3    Period  Weeks    Status  On-going    [place] Date  07/03/19      PT SHORT TERM GOAL #2   Title  PT to impreve her cervical rotation by 15 degrees to allow her to have a conversation with a person sitting beside her.    Time  3    Period  Weeks    Status  On-going      PT SHORT TERM GOAL #3   Title  PT cervical strength to improve by 1 grade to allow headaches to decrease by 50%    Status  On-going        PT Long Term Goals - 06/23/19 0858      PT LONG TERM GOAL #1   Title  Pt cervical pain to be no greater than a 4/10 to allow pt to be able to complete her normal ADL's without increased pain.    Time  6    Period  Weeks    Status  On-going      PT LONG TERM GOAL #2   Title  PT cervical rotation to improve 20 degrees to allow pt to look behind her with ease.    Time  6    Period  Weeks    Status  On-going      PT LONG TERM GOAL #3   Title  Pt cervical strengh to increase  to at least a 4/5 to allow pt headaches to decrease by 75%.    Time  6    Period  Weeks    Status  On-going      PT LONG TERM GOAL #4   Title  PT to be displaying increased awareness of proper posture to decrease cervical, thoracic and lumbar pain.     Period  Weeks    Status  On-going            Plan - 07/10/19 1028    Clinical Impression Statement  PT demonstrates improved motion in cervical area, Pain improving. Added wall push up as well as money.  Pf continues to need verbal and tactile cuing to complete exercise correctly    Personal Factors and Comorbidities  Behavior Pattern;Comorbidity 3+;Finances;Fitness;Time since onset of injury/illness/exacerbation;Transportation    Comorbidities  OA, chronic knee, back and shoulder pain, HTN, COPD, smoker.    Examination-Activity Limitations  Bathing;Carry;Lift;Reach Overhead;Sleep    Examination-Participation Restrictions  Cleaning;Driving;Laundry;Meal Prep    Stability/Clinical Decision Making  Evolving/Moderate complexity    Rehab Potential  Fair    PT Frequency  2x / week    PT Duration  6 weeks    PT Treatment/Interventions  ADLs/Self Care Home Management;Therapeutic activities;Therapeutic exercise;Patient/family education;Manual techniques;Dry needling    PT Next Visit Plan  Progress scapular strengthening as tolerated. Continue manual therapy to address mobility restrictions.    PT Home Exercise Plan  sit tall, scapular retraction, cervical side bend and extension.  06/17/19: decompression 1-5       Patient will benefit from skilled therapeutic intervention in order to improve the following deficits and impairments:  Decreased activity tolerance, Decreased range of motion, Decreased strength, Impaired perceived functional ability, Impaired UE functional use, Postural dysfunction, Pain, Improper body mechanics  Visit Diagnosis: Abnormal posture  Muscle weakness (generalized)  Cervicalgia     Problem List Patient Active Problem List   Diagnosis Date Noted  . Neck pain 05/22/2019  . Gross hematuria 05/11/2019  . Candidiasis 05/11/2019  . Staph aureus infection 05/11/2019  . Pulmonary embolism without acute cor pulmonale ([place])   . Uncontrolled type 2 diabetes mellitus  with hyperglycemia, with long-term current use of insulin ([place]) 08/04/2017  . Uncontrolled type 2 diabetes mellitus with diabetic polyneuropathy, with long-term current use of insulin ([place]) 08/04/2017  . Hypercalcemia 08/04/2017  . Obesity, Class III, BMI 40-49.9 (morbid obesity) ([place]) 08/04/2017  . Hypokalemia 08/04/2017  . Pulmonary embolism ([place]) 08/03/2017  . Sleep apnea 08/03/2017  . Encounter for screening colonoscopy   . Gastritis without bleeding   . Dysphagia 12/14/2016  . S/P laparoscopic hysterectomy 11/04/2015  . Endometrial cancer ([place]) 10/25/2015  . Diabetes mellitus with neuropathy ([place]) 10/25/2015  . Cancer of endometrium ([place]) 10/18/2015  . Heavy menses 10/11/2015  . COPD exacerbation ([place]) 07/04/2014  . Acute bronchitis 05/25/2013  . Fever 05/25/2013  . Asthma 05/25/2013  . Chronic obstructive pulmonary disease ([place]) 05/25/2013  . Tobacco abuse 05/25/2013  . Obesity 05/25/2013  . HTN (hypertension), benign 05/25/2013  . Hypercholesteremia 05/25/2013  . Pedal edema 05/25/2013  . Thyromegaly 05/25/2013  . Abdominal pain 04/11/2011  . Constipation 04/11/2011  . GERD (gastroesophageal reflux disease) 04/11/2011    Rayetta Humphrey, PT CLT [phone] 07/10/2019, 10:47 AM  [place], [place], [address] Phone: [phone]  Fax:  [phone]  Name: Sonya James MRN: [id number] Date of Birth: April 17, 1966

## 2019-07-15 ENCOUNTER — Encounter (HOSPITAL_COMMUNITY): Payer: Medicaid Other

## 2019-07-15 ENCOUNTER — Telehealth (HOSPITAL_COMMUNITY): Payer: Self-pay

## 2019-07-15 NOTE — Telephone Encounter (Signed)
pt called to cancel her appts due to she was not on the list for the scat bus pickup this week

## 2019-07-17 ENCOUNTER — Encounter (HOSPITAL_COMMUNITY): Payer: Medicaid Other

## 2019-08-20 ENCOUNTER — Other Ambulatory Visit: Payer: Self-pay

## 2019-08-20 ENCOUNTER — Emergency Department (HOSPITAL_COMMUNITY)
Admission: EM | Admit: 2019-08-20 | Discharge: 2019-08-20 | Disposition: A | Discharge status: Home / Self Care | Payer: Medicaid Other | Attending: Emergency Medicine | Admitting: Emergency Medicine

## 2019-08-20 ENCOUNTER — Emergency Department (HOSPITAL_COMMUNITY): Payer: Medicaid Other

## 2019-08-20 ENCOUNTER — Encounter (HOSPITAL_COMMUNITY): Payer: Self-pay | Admitting: *Deleted

## 2019-08-20 DIAGNOSIS — Z79899 Other long term (current) drug therapy: Secondary | ICD-10-CM | POA: Insufficient documentation

## 2019-08-20 DIAGNOSIS — R2241 Localized swelling, mass and lump, right lower limb: Secondary | ICD-10-CM | POA: Diagnosis present

## 2019-08-20 DIAGNOSIS — Z7901 Long term (current) use of anticoagulants: Secondary | ICD-10-CM | POA: Diagnosis not present

## 2019-08-20 DIAGNOSIS — R600 Localized edema: Secondary | ICD-10-CM | POA: Diagnosis not present

## 2019-08-20 DIAGNOSIS — I10 Essential (primary) hypertension: Secondary | ICD-10-CM | POA: Diagnosis not present

## 2019-08-20 DIAGNOSIS — J449 Chronic obstructive pulmonary disease, unspecified: Secondary | ICD-10-CM | POA: Insufficient documentation

## 2019-08-20 DIAGNOSIS — E039 Hypothyroidism, unspecified: Secondary | ICD-10-CM | POA: Diagnosis not present

## 2019-08-20 DIAGNOSIS — Z8541 Personal history of malignant neoplasm of cervix uteri: Secondary | ICD-10-CM | POA: Diagnosis not present

## 2019-08-20 DIAGNOSIS — F17210 Nicotine dependence, cigarettes, uncomplicated: Secondary | ICD-10-CM | POA: Diagnosis not present

## 2019-08-20 DIAGNOSIS — R609 Edema, unspecified: Secondary | ICD-10-CM

## 2019-08-20 DIAGNOSIS — M545 Low back pain: Secondary | ICD-10-CM | POA: Diagnosis not present

## 2019-08-20 DIAGNOSIS — M62830 Muscle spasm of back: Secondary | ICD-10-CM | POA: Diagnosis not present

## 2019-08-20 DIAGNOSIS — M79661 Pain in right lower leg: Secondary | ICD-10-CM | POA: Diagnosis not present

## 2019-08-20 DIAGNOSIS — E1140 Type 2 diabetes mellitus with diabetic neuropathy, unspecified: Secondary | ICD-10-CM | POA: Insufficient documentation

## 2019-08-20 DIAGNOSIS — Z794 Long term (current) use of insulin: Secondary | ICD-10-CM | POA: Diagnosis not present

## 2019-08-20 MED ORDER — METHOCARBAMOL 500 MG PO TABS
750.0000 mg | ORAL_TABLET | Freq: Once | ORAL | Status: AC
  Administered 2019-08-20: 750 mg via ORAL
  Filled 2019-08-20: qty 2

## 2019-08-20 MED ORDER — METHOCARBAMOL 500 MG PO TABS: 500.0000 mg | ORAL_TABLET | Freq: Two times a day (BID) | ORAL | 0 refills | Status: DC

## 2019-08-20 NOTE — ED Provider Notes (Signed)
[facility] EMERGENCY DEPARTMENT Provider Note   CSN: [id number] Arrival date & time: 08/20/19  0930     History   Chief Complaint Chief Complaint  Patient presents with  . Numbness    bilateral legs x weeks    HPI Sonya VECCHIARELLI is a 53 y.o. female.     HPI  53 year old female, with a PMH of COPD, asthma, HTN, HLD, diabetes, morbidly obese presents with complaints of right lower extremity swelling.  Patient states she has had tingling of bilateral lower extremities for 6 or more months.  She states that over the last month she has had swelling of the right lower extremity.  She also notes that she has back pain of the right lower side of her back.  When asked what prompted her to come into the ER today she states "I need to get a job and no one will hire me with these problems."  She denies any acute change in her symptoms today.  She denies any bowel or bladder incontinence, saddle anesthesias.  She denies any injury or trauma, difficulty ambulating.  She denies any fevers, chills, chest pain, shortness of breath.   Past Medical History:  Diagnosis Date  . Anemia   . Anxiety   . Arthritis   . Asthma   . Autonomic neuropathy   . Constipation   . COPD (chronic obstructive pulmonary disease) ([place])   . CTS (carpal tunnel syndrome)   . Depression   . Diabetes mellitus without complication ([place])   . Endometrial cancer ([place]) 2017  . GERD (gastroesophageal reflux disease)   . Headache(784.0)   . HTN (hypertension)   . Hypercholesterolemia   . Hypothyroidism   . IBS (irritable bowel syndrome)   . Osteoarthritis    osteoarthritis  . Pancreatitis    per patient   . Recurrent boils    buttocks and low back.  . Shortness of breath   . Sleep apnea    CPAP machine  . Uterine cancer ([place]) 2017  . Vitamin D deficiency     Patient Active Problem List   Diagnosis Date Noted  . Neck pain 05/22/2019  . Gross hematuria 05/11/2019  . Candidiasis 05/11/2019  . Staph aureus  infection 05/11/2019  . Pulmonary embolism without acute cor pulmonale ([place])   . Uncontrolled type 2 diabetes mellitus with hyperglycemia, with long-term current use of insulin ([place]) 08/04/2017  . Uncontrolled type 2 diabetes mellitus with diabetic polyneuropathy, with long-term current use of insulin ([place]) 08/04/2017  . Hypercalcemia 08/04/2017  . Obesity, Class III, BMI 40-49.9 (morbid obesity) ([place]) 08/04/2017  . Hypokalemia 08/04/2017  . Pulmonary embolism ([place]) 08/03/2017  . Sleep apnea 08/03/2017  . Encounter for screening colonoscopy   . Gastritis without bleeding   . Dysphagia 12/14/2016  . S/P laparoscopic hysterectomy 11/04/2015  . Endometrial cancer ([place]) 10/25/2015  . Diabetes mellitus with neuropathy ([place]) 10/25/2015  . Cancer of endometrium ([place]) 10/18/2015  . Heavy menses 10/11/2015  . COPD exacerbation ([place]) 07/04/2014  . Acute bronchitis 05/25/2013  . Fever 05/25/2013  . Asthma 05/25/2013  . Chronic obstructive pulmonary disease ([place]) 05/25/2013  . Tobacco abuse 05/25/2013  . Obesity 05/25/2013  . HTN (hypertension), benign 05/25/2013  . Hypercholesteremia 05/25/2013  . Pedal edema 05/25/2013  . Thyromegaly 05/25/2013  . Abdominal pain 04/11/2011  . Constipation 04/11/2011  . GERD (gastroesophageal reflux disease) 04/11/2011    Past Surgical History:  Procedure Laterality Date  . ABDOMINAL HYSTERECTOMY  2018   endometrial cancer,  surgery done at [facility]   . BIOPSY  01/02/2017   Procedure: BIOPSY;  Surgeon: Danie Binder, MD;  Location: [facility];  Service: Endoscopy;;  gastric biopsy  . CESAREAN SECTION  1989  . COLONOSCOPY WITH PROPOFOL N/A 01/02/2017   Dr. Oneida Alar: redundant colon, two 2-3 mm polyps in sigmoid colon (hyperplastic)  . ESOPHAGOGASTRODUODENOSCOPY  05/22/2011   Dr. Oneida Alar: H.pylori gastritis   . ESOPHAGOGASTRODUODENOSCOPY (EGD) WITH PROPOFOL N/A 01/02/2017   Dr. 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 s/p dilation, moderate gastritis  (negative H.pylori)  . FOOT SURGERY Left    tendon repair  . OOPHORECTOMY    . POLYPECTOMY  01/02/2017   Procedure: POLYPECTOMY;  Surgeon: Danie Binder, MD;  Location[facility];  Service: Endoscopy;;  sigmoid colon polyps times 2  . REDUCTION MAMMAPLASTY  1996  . SAVORY DILATION N/A 01/02/2017   Procedure: SAVORY DILATION;  Surgeon: Danie Binder, MD;  Location: [facility];  Service: Endoscopy;  Laterality: N/A;  . TUBAL LIGATION       OB History    Gravida  1   Para  1   Term  1   Preterm      AB      Living  1     SAB      TAB      Ectopic      Multiple      Live Births  1            Home Medications    Prior to Admission medications   Medication Sig Start Date End Date Taking? Authorizing Provider  albuterol (PROVENTIL HFA;VENTOLIN HFA) 108 (90 Base) MCG/ACT inhaler Inhale 2 puffs into the lungs every 6 (six) hours as needed for wheezing or shortness of breath.    [provider]  albuterol (PROVENTIL) (2.5 MG/3ML) 0.083% nebulizer solution Take 3 mLs (2.5 mg total) by nebulization every 6 (six) hours as needed for wheezing. 07/05/14   Kathie Dike, MD  apixaban (ELIQUIS) 5 MG TABS tablet Take 2 tablets (10 mg total) by mouth 2 (two) times daily. On 08/12/17 @ 9PM, start 1 tab (5 mg) two times daily. Patient taking differently: Take 5 mg by mouth 2 (two) times daily. On 08/12/17 @ 9PM, start 1 tab (5 mg) two times daily. 08/06/17   Orson Eva, MD  atorvastatin (LIPITOR) 40 MG tablet Take 40 mg by mouth daily.    [provider]  chlorhexidine (HIBICLENS) 4 % external liquid Apply topically daily as needed. Wash area of abscess 05/08/19   Corum, Rex Kras, MD  cycloSPORINE (RESTASIS) 0.05 % ophthalmic emulsion Place 1 drop into both eyes daily.    [provider]  doxycycline (VIBRAMYCIN) 100 MG capsule Take 1 capsule (100 mg total) by mouth 2 (two) times daily. 05/02/19   Lily Kocher, PA-C  esomeprazole (NEXIUM) 40 MG  capsule Take 1 capsule (40 mg total) by mouth 2 (two) times daily before a meal. 01/02/17   Fields, Marga Melnick, MD  Ferrous Gluconate-C-Folic Acid (IRON-C PO) Take by mouth.    [provider]  fluconazole (DIFLUCAN) 150 MG tablet Take one po every 3rd day 05/08/19   Maryruth Hancock, MD  furosemide (LASIX) 40 MG tablet Take 1 tablet (40 mg total) by mouth daily. 05/27/13   Kathie Dike, MD  gabapentin (NEURONTIN) 300 MG capsule Take 300 mg by mouth 3 (three) times daily.    [provider]  HYDROcodone-acetaminophen (NORCO/VICODIN) 5-325  MG tablet One tablet every six hours for pain.  Limit 7 days. 06/24/19   Sanjuana Kava, MD  hydrocortisone 1 % ointment Apply 1 application topically 2 (two) times daily. 12/13/16   Jonnie Kind, MD  insulin NPH-regular Human (NOVOLIN 70/30 RELION) (70-30) 100 UNIT/ML injection Inject 50 units into skin three times daily (breakfast, supper, bedtime) 08/06/17   Tat, Shanon Brow, MD  JARDIANCE 10 MG TABS tablet Take 10 mg by mouth daily. 01/29/19   [provider]  linaclotide Rolan Lipa) 290 MCG CAPS capsule Take 1 capsule (290 mcg total) by mouth daily before breakfast. 05/09/17   Annitta Needs, NP  lisinopril (PRINIVIL,ZESTRIL) 20 MG tablet Take 1 tablet (20 mg total) by mouth daily. 07/05/14   Kathie Dike, MD  metFORMIN (GLUCOPHAGE) 1000 MG tablet Take 1 tablet (1,000 mg total) by mouth 2 (two) times daily with a meal. 07/05/14   Kathie Dike, MD  montelukast (SINGULAIR) 10 MG tablet Take 10 mg by mouth at bedtime.     [provider]  mupirocin ointment (BACTROBAN) 2 % Place 1 application into the nose daily. 05/08/19   Corum, Rex Kras, MD  naproxen (NAPROSYN) 500 MG tablet Take 500 mg by mouth as needed.  12/11/17   [provider]  ondansetron (ZOFRAN ODT) 4 MG disintegrating tablet Take 4 mg by mouth as needed for nausea or vomiting.  10/11/15   [provider]  potassium chloride (K-DUR) 10 MEQ tablet Take 10 mEq by  mouth every evening.  07/05/14   [provider]  silver sulfADIAZINE (SILVADENE) 1 % cream Apply 1 application topically daily. Patient taking differently: Apply 1 application topically as needed.  07/03/18   Jonnie Kind, MD  sulfamethoxazole-trimethoprim (BACTRIM) 400-80 MG tablet Take 1 tablet by mouth 2 (two) times daily. 05/08/19   Corum, Rex Kras, MD  varenicline (CHANTIX STARTING MONTH PAK) 0.5 MG X 11 & 1 MG X 42 tablet Take one 0.5 mg tablet by mouth once daily for 3 days, then increase to one 0.5 mg tablet twice daily for 4 days, then increase to one 1 mg tablet twice daily. 05/08/19   Corum, Rex Kras, MD  Vitamin D, Ergocalciferol, (DRISDOL) 50000 units CAPS capsule Take 50,000 Units by mouth every 7 (seven) days.    [provider]    Family History Family History  Problem Relation Age of Onset  . Diabetes Mother   . Hypertension Mother   . COPD Mother   . Asthma Mother   . Hypercholesterolemia Mother   . Hypertension Sister   . Hyperlipidemia Sister   . Diabetes Sister   . Asthma Brother   . Alcohol abuse Brother   . Asthma Son   . Cancer Maternal Grandmother        lung, throat, breast  . Alzheimer's disease Maternal Grandmother   . Hypertension Maternal Grandmother   . Hypercholesterolemia Maternal Grandmother   . Heart disease Maternal Grandfather   . Stroke Maternal Grandfather   . Clotting disorder Maternal Grandfather        blood clots in legs  . Hypertension Sister   . Hyperlipidemia Sister   . Stroke Maternal Aunt   . Clotting disorder Maternal Aunt        blood clots in legs  . Hypertension Maternal Aunt   . Hypercholesterolemia Maternal Aunt   . Hypertension Maternal Aunt   . Asthma Maternal Aunt   . Clotting disorder Maternal Uncle  blood clot -legs traveled to heart and he died of heart attack  . Colon cancer Neg Hx     Social History Social History   Tobacco Use  . Smoking status: Current Some Day Smoker    Packs/day:  0.50    Years: 22.00    Pack years: 11.00    Types: Cigarettes  . Smokeless tobacco: Never Used  Substance Use Topics  . Alcohol use: No  . Drug use: No     Allergies   Patient has no known allergies.   Review of Systems Review of Systems  Constitutional: Negative for chills and fever.  HENT: Negative for rhinorrhea and sore throat.   Eyes: Negative for visual disturbance.  Respiratory: Negative for cough and shortness of breath.   Cardiovascular: Positive for leg swelling. Negative for chest pain.  Gastrointestinal: Negative for abdominal pain, diarrhea, nausea and vomiting.  Genitourinary: Negative for dysuria, frequency and urgency.  Musculoskeletal: Negative for gait problem, joint swelling and neck pain.  Skin: Negative for rash and wound.  Neurological: Negative for syncope and numbness.  All other systems reviewed and are negative.    Physical Exam Updated Vital Signs BP (!) 149/84 (BP Location: Right Arm)   Pulse 62   Temp 98.2 F (36.8 C) (Oral)   Resp 18   Ht 5\' 5"  (1.651 m)   Wt 111.6 kg   LMP 09/20/2015 (Exact Date)   SpO2 96%   BMI 40.94 kg/m   Physical Exam Vitals signs and nursing note reviewed.  Constitutional:      Appearance: She is well-developed.  HENT:     Head: Normocephalic and atraumatic.  Eyes:     Conjunctiva/sclera: Conjunctivae normal.  Neck:     Musculoskeletal: Neck supple.  Cardiovascular:     Rate and Rhythm: Normal rate and regular rhythm.     Pulses: Normal pulses.     Heart sounds: Normal heart sounds. No murmur.  Pulmonary:     Effort: Pulmonary effort is normal. No respiratory distress.     Breath sounds: Normal breath sounds. No wheezing or rales.  Abdominal:     General: Bowel sounds are normal. There is no distension.     Palpations: Abdomen is soft.     Tenderness: There is no abdominal tenderness.  Musculoskeletal: Normal range of motion.        General: No tenderness or deformity.       Arms:     Right  lower leg: 1+ Edema present.     Left lower leg: No edema.  Skin:    General: Skin is warm and dry.     Findings: No erythema or rash.  Neurological:     Mental Status: She is alert and oriented to person, place, and time.  Psychiatric:        Behavior: Behavior normal.      ED Treatments / Results  Labs (all labs ordered are listed, but only abnormal results are displayed) Labs Reviewed - No data to display  EKG None  Radiology No results found.  Procedures Procedures (including critical care time)  Medications Ordered in ED Medications  methocarbamol (ROBAXIN) tablet 750 mg (has no administration in time range)     Initial Impression / Assessment and Plan / ED Course  I have reviewed the triage vital signs and the nursing notes.  Pertinent labs & imaging results that were available during my care of the patient were reviewed by me and considered in my medical decision making (  see chart for details).        Presents with right lower extremity swelling.  She does have trace edema noted in the right lower extremity.  There is no evidence of cellulitis.  She is neurovascularly intact distal bilateral lower extremities.  She denies any saddle anesthesias, bowel or bladder incontinence.  History and physical not consistent with cauda equina.  She has equal strength in bilateral lower extremities.  She had a DVT study which shows no evidence of DVT in the right lower extremity.  He was given Robaxin for her chronic muscle spasms of her back.  She is feeling much better.  I encouraged her to follow-up with her PCP and she said while she was waiting in the ER she has made appointment for virtual visit with her PCP.  I will write for prescription of Robaxin.  She was given return precautions.  She is ready and stable for discharge.   At this time there does not appear to be any evidence of an acute emergency medical condition and the patient appears stable for discharge with  appropriate outpatient follow up.Diagnosis was discussed with patient who verbalizes understanding and is agreeable to discharge.   Final Clinical Impressions(s) / ED Diagnoses   Final diagnoses:  None    ED Discharge Orders    None       Rachel Moulds 08/20/19 1628    Elnora Morrison, MD 08/24/19 [phone]

## 2019-08-20 NOTE — Discharge Instructions (Signed)
Keep your follow-up appointment with your primary care provider as you have scheduled.  Take Robaxin as needed for muscle spasms.  Elevate legs and reduce salt intake.  Return to the ED immediately for new or worsening symptoms or concerns, such as pain, difficulty walking, weakness or any concerns at all.

## 2019-08-20 NOTE — ED Triage Notes (Signed)
Pt comes in from home with c/o bilateral leg numbness that starts from her feet and goes up her legs into a twisting/cramping feeling in her lower back. Pt reports this has been going on for months, but has gotten worse in the last few weeks.

## 2019-08-21 ENCOUNTER — Telehealth (INDEPENDENT_AMBULATORY_CARE_PROVIDER_SITE_OTHER): Payer: Medicaid Other | Admitting: Family Medicine

## 2019-08-21 DIAGNOSIS — I10 Essential (primary) hypertension: Secondary | ICD-10-CM | POA: Diagnosis not present

## 2019-08-21 DIAGNOSIS — E0840 Diabetes mellitus due to underlying condition with diabetic neuropathy, unspecified: Secondary | ICD-10-CM | POA: Diagnosis not present

## 2019-08-21 DIAGNOSIS — E876 Hypokalemia: Secondary | ICD-10-CM

## 2019-08-21 DIAGNOSIS — I2699 Other pulmonary embolism without acute cor pulmonale: Secondary | ICD-10-CM | POA: Diagnosis not present

## 2019-08-21 DIAGNOSIS — M542 Cervicalgia: Secondary | ICD-10-CM | POA: Diagnosis not present

## 2019-08-21 DIAGNOSIS — E010 Iodine-deficiency related diffuse (endemic) goiter: Secondary | ICD-10-CM

## 2019-08-21 DIAGNOSIS — J449 Chronic obstructive pulmonary disease, unspecified: Secondary | ICD-10-CM

## 2019-08-21 DIAGNOSIS — Z720 Tobacco use: Secondary | ICD-10-CM

## 2019-08-21 MED ORDER — VARENICLINE TARTRATE 1 MG PO TABS: 1.0000 mg | ORAL_TABLET | Freq: Two times a day (BID) | ORAL | 2 refills | Status: DC

## 2019-08-21 MED ORDER — ESOMEPRAZOLE MAGNESIUM 40 MG PO CPDR: 40.0000 mg | DELAYED_RELEASE_CAPSULE | Freq: Two times a day (BID) | ORAL | 11 refills | Status: DC

## 2019-08-21 MED ORDER — APIXABAN 5 MG PO TABS: 5.0000 mg | ORAL_TABLET | Freq: Two times a day (BID) | ORAL | 5 refills | Status: DC

## 2019-08-21 MED ORDER — MONTELUKAST SODIUM 10 MG PO TABS: 10.0000 mg | ORAL_TABLET | Freq: Every day | ORAL | 5 refills | Status: DC

## 2019-08-21 MED ORDER — FUROSEMIDE 40 MG PO TABS: 40.0000 mg | ORAL_TABLET | Freq: Every day | ORAL | 5 refills | Status: DC

## 2019-08-21 MED ORDER — ATORVASTATIN CALCIUM 40 MG PO TABS: 40.0000 mg | ORAL_TABLET | Freq: Every day | ORAL | 5 refills | Status: DC

## 2019-08-21 MED ORDER — ALBUTEROL SULFATE (2.5 MG/3ML) 0.083% IN NEBU: 2.5000 mg | INHALATION_SOLUTION | Freq: Four times a day (QID) | RESPIRATORY_TRACT | 12 refills | Status: DC | PRN

## 2019-08-21 MED ORDER — ALBUTEROL SULFATE HFA 108 (90 BASE) MCG/ACT IN AERS: 2.0000 | INHALATION_SPRAY | Freq: Four times a day (QID) | RESPIRATORY_TRACT | 5 refills | Status: DC | PRN

## 2019-08-21 MED ORDER — LISINOPRIL 20 MG PO TABS: 20.0000 mg | ORAL_TABLET | Freq: Every day | ORAL | 5 refills | Status: DC

## 2019-08-21 MED ORDER — POTASSIUM CHLORIDE ER 10 MEQ PO TBCR: 10.0000 meq | EXTENDED_RELEASE_TABLET | Freq: Every evening | ORAL | 5 refills | Status: DC

## 2019-08-21 NOTE — Progress Notes (Signed)
Virtual Visit via Telephone Note  I connected with AKELA SCHILLING on 08/21/19 at  9:40 AM EST by telephone and verified that I am speaking with the correct person using two identifiers.  Location: Patient: mom 's house Provider: clinic   I discussed the limitations, risks, security and privacy concerns of performing an evaluation and management service by telephone and the availability of in person appointments. I also discussed with the patient that there may be a patient responsible charge related to this service. The patient expressed understanding and agreed to proceed.   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 Pt sees hematology yearly for evaluation-work up completed for underlying concern  COPD-breathing improving-no tob use in 6 weeks-using albuterol TID either nebs or MDI and singulair. NO steroids needed currently-pt has not smoked in 6 weeks  DM-pt has not seen her endo-does not know glucose readings-using insulin and metformin  HTN-taking lasix, lisinopril daily-no bp readings at home-no headaches or visual change  Hypokalemia-takes potassium daily  GERD-using nexium and linzess-stable-sees GI  Neck pain-seeing rehab with improvement-using naprosyn and robaxin-no longer working    Observations/Objective: No blood pressure or glucose readings  Assessment and Plan:  1. Diabetes mellitus due to underlying condition with diabetic neuropathy, unspecified whether long term insulin use ([place]) Followed by endo-no recent office visit-needs A1c for evaluation. Pt will contact endocrinologist for appt-missed prior appt  - CBC with Differential - COMPLETE METABOLIC PANEL WITH GFR - Lipid panel - TSH - Hemoglobin A1c  2. HTN (hypertension), benign Lisinpril/lasix-taking daily-stable-needs blood work for renal function - COMPLETE METABOLIC PANEL WITH GFR - Lipid panel - TSH -  Microalbumin, urine  3. Hypokalemia Taking Lasix-recheck level-takes daily  4. Thyromegaly - TSH  5. Chronic obstructive pulmonary disease, unspecified COPD type (HCC) Albuterol nebs/MDI/singulair-continue-stable-no current trigger-better off tobacco  6. Tobacco abuse Continue chantix-6 weeks quit from use-rx 7. Neck pain-robaxin, naprosyn-episodic pain 8. PE-cbc, eliquis-rx-hematology following Follow Up Instructions: 3 months-phone visit-blood work fasting asap for assessment    I discussed the assessment and treatment plan with the patient. The patient was provided an opportunity to ask questions and all were answered. The patient agreed with the plan and demonstrated an understanding of the instructions.   The patient was advised to call back or seek an in-person evaluation if the symptoms worsen or if the conditions fails to improve as anticipated.  I provided 20 minutes of non-face-to-face time during this encounter.   Jerris Keltz Hannah Beat, MD

## 2019-08-26 ENCOUNTER — Ambulatory Visit: Payer: Medicaid Other | Admitting: Orthopaedic Surgery

## 2019-08-26 DIAGNOSIS — Z7689 Persons encountering health services in other specified circumstances: Secondary | ICD-10-CM | POA: Diagnosis not present

## 2019-08-27 ENCOUNTER — Other Ambulatory Visit (HOSPITAL_COMMUNITY)
Admission: RE | Admit: 2019-08-27 | Discharge: 2019-08-27 | Disposition: A | Discharge status: Home / Self Care | Payer: Medicaid Other | Source: Ambulatory Visit | Attending: Family Medicine | Admitting: Family Medicine

## 2019-08-27 DIAGNOSIS — E010 Iodine-deficiency related diffuse (endemic) goiter: Secondary | ICD-10-CM | POA: Diagnosis not present

## 2019-08-27 DIAGNOSIS — E0840 Diabetes mellitus due to underlying condition with diabetic neuropathy, unspecified: Secondary | ICD-10-CM | POA: Insufficient documentation

## 2019-08-27 DIAGNOSIS — I10 Essential (primary) hypertension: Secondary | ICD-10-CM | POA: Diagnosis not present

## 2019-08-27 LAB — COMPREHENSIVE METABOLIC PANEL
ALT: 13 U/L (ref 0–44)
AST: 14 U/L — ABNORMAL LOW (ref 15–41)
Albumin: 4 g/dL (ref 3.5–5.0)
Alkaline Phosphatase: 92 U/L (ref 38–126)
Anion gap: 7 (ref 5–15)
BUN: 9 mg/dL (ref 6–20)
CO2: 30 mmol/L (ref 22–32)
Calcium: 10.1 mg/dL (ref 8.9–10.3)
Chloride: 105 mmol/L (ref 98–111)
Creatinine, Ser: 0.89 mg/dL (ref 0.44–1.00)
GFR calc Af Amer: >60 mL/min (ref >60)
GFR calc non Af Amer: >60 mL/min (ref >60)
Glucose, Bld: 146 mg/dL — ABNORMAL HIGH (ref 70–99)
Potassium: 4 mmol/L (ref 3.5–5.1)
Sodium: 142 mmol/L (ref 135–145)
Total Bilirubin: 0.6 mg/dL (ref 0.3–1.2)
Total Protein: 7.4 g/dL (ref 6.5–8.1)

## 2019-08-27 LAB — CBC WITH DIFFERENTIAL/PLATELET
Abs Immature Granulocytes: 0.04 10*3/uL (ref 0.00–0.07)
Basophils Absolute: 0.1 10*3/uL (ref 0.0–0.1)
Basophils Relative: 1 %
Eosinophils Absolute: 0.3 10*3/uL (ref 0.0–0.5)
Eosinophils Relative: 4 %
HCT: 48.7 % — ABNORMAL HIGH (ref 36.0–46.0)
Hemoglobin: 15.3 g/dL — ABNORMAL HIGH (ref 12.0–15.0)
Immature Granulocytes: 1 %
Lymphocytes Relative: 30 %
Lymphs Abs: 2.3 10*3/uL (ref 0.7–4.0)
MCH: 30.6 pg (ref 26.0–34.0)
MCHC: 31.4 g/dL (ref 30.0–36.0)
MCV: 97.4 fL (ref 80.0–100.0)
Monocytes Absolute: 0.5 10*3/uL (ref 0.1–1.0)
Monocytes Relative: 7 %
Neutro Abs: 4.5 10*3/uL (ref 1.7–7.7)
Neutrophils Relative %: 57 %
Platelets: 356 10*3/uL (ref 150–400)
RBC: 5 MIL/uL (ref 3.87–5.11)
RDW: 12.6 % (ref 11.5–15.5)
WBC: 7.7 10*3/uL (ref 4.0–10.5)
nRBC: 0 % (ref 0.0–0.2)

## 2019-08-27 LAB — LIPID PANEL
Cholesterol: 234 mg/dL — ABNORMAL HIGH (ref 0–200)
HDL: 35 mg/dL — ABNORMAL LOW (ref >40)
LDL Cholesterol: 151 mg/dL — ABNORMAL HIGH (ref 0–99)
Total CHOL/HDL Ratio: 6.7 RATIO
Triglycerides: 238 mg/dL — ABNORMAL HIGH (ref <150)
VLDL: 48 mg/dL — ABNORMAL HIGH (ref 0–40)

## 2019-08-27 LAB — HEMOGLOBIN A1C
Hgb A1c MFr Bld: 9.7 % — ABNORMAL HIGH (ref 4.8–5.6)
Mean Plasma Glucose: 231.69 mg/dL

## 2019-08-27 LAB — TSH: TSH: 2.507 u[IU]/mL (ref 0.350–4.500)

## 2019-08-28 LAB — MICROALBUMIN, URINE: Microalb, Ur: 4.1 ug/mL — ABNORMAL HIGH

## 2019-09-01 ENCOUNTER — Telehealth: Payer: Self-pay

## 2019-09-01 NOTE — Telephone Encounter (Signed)
Sonya James is stating that she is still having a lot of numbness and pain in her legs at this time, please advise?

## 2019-09-01 NOTE — Telephone Encounter (Signed)
Happy to see pt for appointment to discuss

## 2019-09-01 NOTE — Telephone Encounter (Signed)
Patient scheduled an appointment on 09/10/19

## 2019-09-02 ENCOUNTER — Encounter: Payer: Self-pay | Admitting: Orthopaedic Surgery

## 2019-09-02 ENCOUNTER — Ambulatory Visit: Payer: Medicaid Other | Admitting: Orthopaedic Surgery

## 2019-09-02 ENCOUNTER — Other Ambulatory Visit: Payer: Self-pay

## 2019-09-02 ENCOUNTER — Telehealth: Payer: Self-pay | Admitting: Orthopaedic Surgery

## 2019-09-02 VITALS — BP 121/68 | HR 76 | Temp 97.3°F | Ht 65.0 in | Wt 233.0 lb

## 2019-09-02 DIAGNOSIS — M25562 Pain in left knee: Secondary | ICD-10-CM | POA: Diagnosis not present

## 2019-09-02 DIAGNOSIS — M25561 Pain in right knee: Secondary | ICD-10-CM | POA: Diagnosis not present

## 2019-09-02 DIAGNOSIS — G8929 Other chronic pain: Secondary | ICD-10-CM

## 2019-09-02 MED ORDER — HYDROCODONE-ACETAMINOPHEN 5-325 MG PO TABS: ORAL_TABLET | ORAL | 0 refills | Status: DC

## 2019-09-02 NOTE — Telephone Encounter (Signed)
Patient states when she saw you today she was under the impression that you were going to send in medication for her.  She asks for refill on   Hydrocodone/Acetaminophen 5-325  Mgs.  Qty 28  Sig: One tablet every six hours for pain. Limit 7 days.  She uses [facility] in [place]

## 2019-09-02 NOTE — Progress Notes (Signed)
CC: Both of my knees are hurting. I would like an injection in both knees.  The patient has had chronic pain and tenderness of both knees for some time.  Injections help.  There is no locking or giving way of the knee.  There is no new trauma. There is no redness or signs of infections.  The knees have a mild effusion and some crepitus.  There is no redness or signs of recent trauma.  Right knee ROM is 0-100 and left knee ROM is 0-105.  Impression:  Chronic pain of the both knees  Return:  2 months  PROCEDURE NOTE:  The patient requests injections of both knees, verbal consent was obtained.  The left and right knee were individually prepped appropriately after time out was performed.   Sterile technique was observed and injection of 1 cc of Depo-Medrol 40 mg with several cc's of plain xylocaine. Anesthesia was provided by ethyl chloride and a 20-gauge needle was used to inject each knee area. The injections were tolerated well.  A band aid dressing was applied.  The patient was advised to apply ice later today and tomorrow to the injection sight as needed.   Electronically Signed Sanjuana Kava, MD 12/22/20209:19 AM

## 2019-09-09 DIAGNOSIS — E1165 Type 2 diabetes mellitus with hyperglycemia: Secondary | ICD-10-CM | POA: Diagnosis not present

## 2019-09-09 DIAGNOSIS — Z7689 Persons encountering health services in other specified circumstances: Secondary | ICD-10-CM | POA: Diagnosis not present

## 2019-09-09 DIAGNOSIS — Z794 Long term (current) use of insulin: Secondary | ICD-10-CM | POA: Diagnosis not present

## 2019-09-09 DIAGNOSIS — Z5181 Encounter for therapeutic drug level monitoring: Secondary | ICD-10-CM | POA: Diagnosis not present

## 2019-09-10 ENCOUNTER — Ambulatory Visit (INDEPENDENT_AMBULATORY_CARE_PROVIDER_SITE_OTHER): Payer: Medicaid Other | Admitting: Family Medicine

## 2019-09-10 ENCOUNTER — Other Ambulatory Visit: Payer: Self-pay

## 2019-09-10 ENCOUNTER — Encounter: Payer: Self-pay | Admitting: Family Medicine

## 2019-09-10 VITALS — BP 120/65 | HR 66 | Temp 97.9°F | Ht 65.0 in | Wt 234.8 lb

## 2019-09-10 DIAGNOSIS — M5030 Other cervical disc degeneration, unspecified cervical region: Secondary | ICD-10-CM | POA: Diagnosis not present

## 2019-09-10 NOTE — Progress Notes (Signed)
Established Patient Office Visit  Subjective:  Patient ID: Sonya James, female    DOB: [birth year]  Age: 53 y.o. MRN: [id number]  CC:  Chief Complaint  Patient presents with  . Leg Pain    leg weakness & burning and tingling & numbness  . Foot Pain    feet burn   . Spasms    back    HPI Sonya James presents concerns about pain-h/o carpal tunnel, arthritis-neurontin and narcotics in the past for pain management-pt request referral. [facility] week for evaluation of her feet. Concern for worsening peripheral neuropathy. Pt with difficulty functionally-husband assisting. Pt lost home and landlord locked apartment and all furniture and elements to assist pt lost.  Pt and husband now living in a hotel until additional housing can be found.    Past Medical History:  Diagnosis Date  . Anemia   . Anxiety   . Arthritis   . Asthma   . Autonomic neuropathy   . Constipation   . COPD (chronic obstructive pulmonary disease) ([place])   . CTS (carpal tunnel syndrome)   . Depression   . Diabetes mellitus without complication ([place])   . Endometrial cancer ([place]) 2017  . GERD (gastroesophageal reflux disease)   . Headache(784.0)   . HTN (hypertension)   . Hypercholesterolemia   . Hypothyroidism   . IBS (irritable bowel syndrome)   . Osteoarthritis    osteoarthritis  . Pancreatitis    per patient   . Recurrent boils    buttocks and low back.  . Shortness of breath   . Sleep apnea    CPAP machine  . Uterine cancer ([place]) 2017  . Vitamin D deficiency     Past Surgical History:  Procedure Laterality Date  . ABDOMINAL HYSTERECTOMY  2018   endometrial cancer, surgery done at [facility]   . BIOPSY  01/02/2017   Procedure: BIOPSY;  Surgeon: Danie Binder, MD;  Location[facility];  Service: Endoscopy;;  gastric biopsy  . CESAREAN SECTION  1989  . COLONOSCOPY WITH PROPOFOL N/A 01/02/2017   Dr. Oneida Alar: redundant colon, two 2-3 mm polyps in sigmoid colon  (hyperplastic)  . ESOPHAGOGASTRODUODENOSCOPY  05/22/2011   Dr. Oneida Alar: H.pylori gastritis   . ESOPHAGOGASTRODUODENOSCOPY (EGD) WITH PROPOFOL N/A 01/02/2017   Dr. 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 FOOT SURGERY Left    tendon repair  . OOPHORECTOMY    . POLYPECTOMY  01/02/2017   Procedure: POLYPECTOMY;  Surgeon: Danie Binder, MD;  Location[facility];  Service: Endoscopy;;  sigmoid colon polyps times 2  . REDUCTION MAMMAPLASTY  1996  . SAVORY DILATION N/A 01/02/2017   Procedure: SAVORY DILATION;  Surgeon: Danie Binder, MD;  [facility];  Service: Endoscopy;  Laterality: N/A;  . TUBAL LIGATION      Family History  Problem Relation Age of Onset  . Diabetes Mother   . Hypertension Mother   . COPD Mother   . Asthma Mother   . Hypercholesterolemia Mother   . Hypertension Sister   . Hyperlipidemia Sister   . Diabetes Sister   . Asthma Brother   . Alcohol abuse Brother   . Asthma Son   . Cancer Maternal Grandmother        lung, throat, breast  . Alzheimer's disease Maternal Grandmother   . Hypertension Maternal Grandmother   . Hypercholesterolemia Maternal Grandmother   . Heart disease  Maternal Grandfather   . Stroke Maternal Grandfather   . Clotting disorder Maternal Grandfather        blood clots in legs  . Hypertension Sister   . Hyperlipidemia Sister   . Stroke Maternal Aunt   . Clotting disorder Maternal Aunt        blood clots in legs  . Hypertension Maternal Aunt   . Hypercholesterolemia Maternal Aunt   . Hypertension Maternal Aunt   . Asthma Maternal Aunt   . Clotting disorder Maternal Uncle        blood clot -legs traveled to heart and he died of heart attack  . Colon cancer Neg Hx     Social History   Socioeconomic History  . Marital status: Married    Spouse name: Not on file  . Number of children: 1  . Years of education: Not on file  . Highest education level: Not on file   Occupational History  . Not on file  Tobacco Use  . Smoking status: Current Some Day Smoker    Packs/day: 0.50    Years: 22.00    Pack years: 11.00    Types: Cigarettes  . Smokeless tobacco: Never Used  Substance and Sexual Activity  . Alcohol use: No  . Drug use: No  . Sexual activity: Yes    Birth control/protection: Surgical    Comment: hyst  Other Topics Concern  . Not on file  Social History Narrative   Drinks coffee occasionally, soda 2-3 daily    Social Determinants of Health   Financial Resource Strain:   . Difficulty of Paying Living Expenses: Not on file  Food Insecurity:   . Worried About Charity fundraiser in the Last Year: Not on file  . Ran Out of Food in the Last Year: Not on file  Transportation Needs:   . Lack of Transportation (Medical): Not on file  . Lack of Transportation (Non-Medical): Not on file  Physical Activity:   . Days of Exercise per Week: Not on file  . Minutes of Exercise per Session: Not on file  Stress:   . Feeling of Stress : Not on file  Social Connections:   . Frequency of Communication with Friends and Family: Not on file  . Frequency of Social Gatherings with Friends and Family: Not on file  . Attends Religious Services: Not on file  . Active Member of Clubs or Organizations: Not on file  . Attends Archivist Meetings: Not on file  . Marital Status: Not on file  Intimate Partner Violence:   . Fear of Current or Ex-Partner: Not on file  . Emotionally Abused: Not on file  . Physically Abused: Not on file  . Sexually Abused: Not on file    Outpatient Medications Prior to Visit  Medication Sig Dispense Refill  . albuterol (PROVENTIL) (2.5 MG/3ML) 0.083% nebulizer solution Take 3 mLs (2.5 mg total) by nebulization every 6 (six) hours as needed for wheezing. 75 mL 12  . albuterol (VENTOLIN HFA) 108 (90 Base) MCG/ACT inhaler Inhale 2 puffs into the lungs every 6 (six) hours as needed for wheezing or shortness of breath.  18 g 5  . apixaban (ELIQUIS) 5 MG TABS tablet Take 1 tablet (5 mg total) by mouth 2 (two) times daily. On 08/12/17 @ 9PM, start 1 tab (5 mg) two times daily. 60 tablet 5  . atorvastatin (LIPITOR) 40 MG tablet Take 1 tablet (40 mg total) by mouth daily. 30 tablet 5  .  chlorhexidine (HIBICLENS) 4 % external liquid Apply topically daily as needed. Wash area of abscess (Patient not taking: Reported on 08/20/2019) 473 mL 0  . cycloSPORINE (RESTASIS) 0.05 % ophthalmic emulsion Place 1 drop into both eyes daily.    [name] doxycycline (VIBRAMYCIN) 100 MG capsule Take 1 capsule (100 mg total) by mouth 2 (two) times daily. (Patient not taking: Reported on 08/20/2019) 14 capsule 0  . esomeprazole (NEXIUM) 40 MG capsule Take 1 capsule (40 mg total) by mouth 2 (two) times daily before a meal. 60 capsule 11  . Ferrous Gluconate-C-Folic Acid (IRON-C PO) Take by mouth.    . fluconazole (DIFLUCAN) 150 MG tablet Take one po every 3rd day (Patient not taking: Reported on 08/20/2019) 3 tablet 0  . furosemide (LASIX) 40 MG tablet Take 1 tablet (40 mg total) by mouth daily. 30 tablet 5  . gabapentin (NEURONTIN) 300 MG capsule Take 300 mg by mouth 3 (three) times daily.    [name] HYDROcodone-acetaminophen (NORCO/VICODIN) 5-325 MG tablet One tablet every six hours for pain.  Limit 7 days. 28 tablet 0  . hydrocortisone 1 % ointment Apply 1 application topically 2 (two) times daily. (Patient not taking: Reported on 08/20/2019) 30 g prn  . insulin NPH-regular Human (NOVOLIN 70/30 RELION) (70-30) 100 UNIT/ML injection Inject 50 units into skin three times daily (breakfast, supper, bedtime) 10 mL 1  . JARDIANCE 25 MG TABS tablet Take 25 mg by mouth daily.    [name] linaclotide (LINZESS) 290 MCG CAPS capsule Take 1 capsule (290 mcg total) by mouth daily before breakfast. 90 capsule 3  . lisinopril (ZESTRIL) 20 MG tablet Take 1 tablet (20 mg total) by mouth daily. 30 tablet 5  . metFORMIN (GLUCOPHAGE) 1000 MG tablet Take 1 tablet (1,000 mg total)  by mouth 2 (two) times daily with a meal. 60 tablet 0  . methocarbamol (ROBAXIN) 500 MG tablet Take 1 tablet (500 mg total) by mouth 2 (two) times daily. 20 tablet 0  . montelukast (SINGULAIR) 10 MG tablet Take 1 tablet (10 mg total) by mouth at bedtime. 30 tablet 5  . mupirocin ointment (BACTROBAN) 2 % Place 1 application into the nose daily. 22 g 0  . naproxen (NAPROSYN) 500 MG tablet Take 500 mg by mouth as needed.   5  . ondansetron (ZOFRAN ODT) 4 MG disintegrating tablet Take 4 mg by mouth as needed for nausea or vomiting.     . potassium chloride (KLOR-CON) 10 MEQ tablet Take 1 tablet (10 mEq total) by mouth every evening. 30 tablet 5  . silver sulfADIAZINE (SILVADENE) 1 % cream Apply 1 application topically daily. (Patient taking differently: Apply 1 application topically as needed. ) 50 g 0  . sulfamethoxazole-trimethoprim (BACTRIM) 400-80 MG tablet Take 1 tablet by mouth 2 (two) times daily. (Patient not taking: Reported on 08/20/2019) 20 tablet 0  . varenicline (CHANTIX CONTINUING MONTH PAK) 1 MG tablet Take 1 tablet (1 mg total) by mouth 2 (two) times daily. 60 tablet 2  . Vitamin D, Ergocalciferol, (DRISDOL) 50000 units CAPS capsule Take 50,000 Units by mouth every 7 (seven) days.     No facility-administered medications prior to visit.    No Known Allergies  ROS Review of Systems  Constitutional: Negative.   HENT: Negative.   Respiratory: Negative.   Musculoskeletal: Positive for back pain, joint swelling and myalgias.      Objective:    Physical Exam  Constitutional: She is oriented to person, place, and time. She appears well-developed and  well-nourished.  HENT:  Head: Normocephalic and atraumatic.  Eyes: Conjunctivae are normal.  Cardiovascular: Normal rate.  Pulmonary/Chest: Effort normal and breath sounds normal.  Musculoskeletal:        General: Tenderness present. Normal range of motion.  Neurological: She is oriented to person, place, and time.    BP 120/65  (BP Location: Left Arm, Patient Position: Sitting, Cuff Size: Normal)   Pulse 66   Temp 97.9 F (36.6 C) (Oral)   Ht 5\' 5"  (1.651 m)   Wt 234 lb 12.8 oz (106.5 kg)   LMP 09/20/2015 (Exact Date)   SpO2 96%   BMI 39.07 kg/m  Wt Readings from Last 3 Encounters:  09/10/19 234 lb 12.8 oz (106.5 kg)  09/02/19 233 lb (105.7 kg)  08/20/19 246 lb 0.5 oz (111.6 kg)     Health Maintenance Due  Topic Date Due  . FOOT EXAM  10/14/1975  . OPHTHALMOLOGY EXAM  10/14/1975  . TETANUS/TDAP  10/13/1984    There are no preventive care reminders to display for this patient.  Lab Results  Component Value Date   TSH 2.507 08/27/2019   Lab Results  Component Value Date   WBC 7.7 08/27/2019   HGB 15.3 (H) 08/27/2019   HCT 48.7 (H) 08/27/2019   MCV 97.4 08/27/2019   PLT 356 08/27/2019   Lab Results  Component Value Date   NA 142 08/27/2019   K 4.0 08/27/2019   CO2 30 08/27/2019   GLUCOSE 146 (H) 08/27/2019   BUN 9 08/27/2019   CREATININE 0.89 08/27/2019   BILITOT 0.6 08/27/2019   ALKPHOS 92 08/27/2019   AST 14 (L) 08/27/2019   ALT 13 08/27/2019   PROT 7.4 08/27/2019   ALBUMIN 4.0 08/27/2019   CALCIUM 10.1 08/27/2019   ANIONGAP 7 08/27/2019   Lab Results  Component Value Date   CHOL 234 (H) 08/27/2019   Lab Results  Component Value Date   HDL 35 (L) 08/27/2019   Lab Results  Component Value Date   LDLCALC 151 (H) 08/27/2019   Lab Results  Component Value Date   TRIG 238 (H) 08/27/2019   Lab Results  Component Value Date   CHOLHDL 6.7 08/27/2019   Lab Results  Component Value Date   HGBA1C 9.7 (H) 08/27/2019      Assessment & Plan:  1. DDD (degenerative disc disease), cervical - Ambulatory referral to Physical Medicine Rehab-d/w pt at length functional concerns-pt with ongoing difficulty with mobility.  - Ambulatory referral to Pain Clinic Follow-up: PMand R   Lena Fieldhouse Hannah Beat, MD

## 2019-09-23 DIAGNOSIS — M7752 Other enthesopathy of left foot: Secondary | ICD-10-CM | POA: Diagnosis not present

## 2019-09-23 DIAGNOSIS — M7989 Other specified soft tissue disorders: Secondary | ICD-10-CM | POA: Diagnosis not present

## 2019-09-23 DIAGNOSIS — L600 Ingrowing nail: Secondary | ICD-10-CM | POA: Diagnosis not present

## 2019-09-23 DIAGNOSIS — Z7689 Persons encountering health services in other specified circumstances: Secondary | ICD-10-CM | POA: Diagnosis not present

## 2019-09-23 DIAGNOSIS — M7751 Other enthesopathy of right foot: Secondary | ICD-10-CM | POA: Diagnosis not present

## 2019-09-24 ENCOUNTER — Ambulatory Visit (INDEPENDENT_AMBULATORY_CARE_PROVIDER_SITE_OTHER): Payer: Medicaid Other | Admitting: Physical Medicine and Rehabilitation

## 2019-09-24 ENCOUNTER — Encounter: Payer: Self-pay | Admitting: Physical Medicine and Rehabilitation

## 2019-09-24 ENCOUNTER — Ambulatory Visit (INDEPENDENT_AMBULATORY_CARE_PROVIDER_SITE_OTHER): Payer: Medicaid Other

## 2019-09-24 ENCOUNTER — Other Ambulatory Visit: Payer: Self-pay

## 2019-09-24 VITALS — BP 114/80 | HR 70 | Ht 65.0 in | Wt 230.0 lb

## 2019-09-24 DIAGNOSIS — Z7689 Persons encountering health services in other specified circumstances: Secondary | ICD-10-CM | POA: Diagnosis not present

## 2019-09-24 DIAGNOSIS — M5441 Lumbago with sciatica, right side: Secondary | ICD-10-CM

## 2019-09-24 DIAGNOSIS — M4726 Other spondylosis with radiculopathy, lumbar region: Secondary | ICD-10-CM | POA: Diagnosis not present

## 2019-09-24 DIAGNOSIS — Z794 Long term (current) use of insulin: Secondary | ICD-10-CM | POA: Diagnosis not present

## 2019-09-24 DIAGNOSIS — E0840 Diabetes mellitus due to underlying condition with diabetic neuropathy, unspecified: Secondary | ICD-10-CM | POA: Diagnosis not present

## 2019-09-24 DIAGNOSIS — M5442 Lumbago with sciatica, left side: Secondary | ICD-10-CM

## 2019-09-24 DIAGNOSIS — G8929 Other chronic pain: Secondary | ICD-10-CM

## 2019-09-24 DIAGNOSIS — M5416 Radiculopathy, lumbar region: Secondary | ICD-10-CM

## 2019-09-24 NOTE — Progress Notes (Signed)
 .  Numeric Pain Rating Scale and Functional Assessment Average Pain 8 Pain Right Now 8 My pain is constant, burning, stabbing and aching Pain is worse with: walking and some activites Pain improves with: nothing   In the last MONTH (on 0-10 scale) has pain interfered with the following?  1. General activity like being  able to carry out your everyday physical activities such as walking, climbing stairs, carrying groceries, or moving a chair?  Rating(8)  2. Relation with others like being able to carry out your usual social activities and roles such as  activities at home, at work and in your community. Rating(8)  3. Enjoyment of life such that you have  been bothered by emotional problems such as feeling anxious, depressed or irritable?  Rating(6)

## 2019-09-29 ENCOUNTER — Other Ambulatory Visit: Payer: Self-pay | Admitting: Family Medicine

## 2019-09-29 DIAGNOSIS — B379 Candidiasis, unspecified: Secondary | ICD-10-CM

## 2019-10-03 ENCOUNTER — Telehealth: Payer: Self-pay

## 2019-10-03 DIAGNOSIS — E0840 Diabetes mellitus due to underlying condition with diabetic neuropathy, unspecified: Secondary | ICD-10-CM

## 2019-10-03 MED ORDER — METFORMIN HCL 1000 MG PO TABS: 1000.0000 mg | ORAL_TABLET | Freq: Two times a day (BID) | ORAL | 0 refills | Status: DC

## 2019-10-03 NOTE — Telephone Encounter (Signed)
LeighAnn Orlanda Frankum, CMA  

## 2019-10-07 DIAGNOSIS — Z7689 Persons encountering health services in other specified circumstances: Secondary | ICD-10-CM | POA: Diagnosis not present

## 2019-10-07 DIAGNOSIS — L600 Ingrowing nail: Secondary | ICD-10-CM | POA: Diagnosis not present

## 2019-10-09 ENCOUNTER — Encounter: Payer: Self-pay | Admitting: Physical Medicine and Rehabilitation

## 2019-10-09 DIAGNOSIS — M4726 Other spondylosis with radiculopathy, lumbar region: Secondary | ICD-10-CM | POA: Insufficient documentation

## 2019-10-09 DIAGNOSIS — G8929 Other chronic pain: Secondary | ICD-10-CM | POA: Insufficient documentation

## 2019-10-09 DIAGNOSIS — M545 Low back pain, unspecified: Secondary | ICD-10-CM | POA: Insufficient documentation

## 2019-10-09 NOTE — Progress Notes (Addendum)
SHYIA CORSER - 54 y.o. female MRN [id number]  Date of birth: [birth year]  Office Visit Note: Visit Date: 09/24/2019 PCP: Maryruth Hancock, MD Referred by: Maryruth Hancock, MD  Subjective: Chief Complaint  Patient presents with  . Lower Back - Pain  . Right Thigh - Numbness  . Left Thigh - Numbness   HPI: Sonya James is a 54 y.o. female who comes in today For evaluation and management of lower back pain with chronic worsening pain in the thighs with some paresthesia.  She comes in at the request of Dr. Benny Lennert.  She also sees Dr[facility] Sanjuana Kava at [facility] in [place].  This is mainly for her knees.  She has a complicated medical background particularly with insulin-dependent diabetes is not well controlled.  She reports today 8 out of 10 constant burning stabbing and aching pain particularly in the lower back and both thighs.  She reports this is a chronic problem that started over a year ago and is just not gotten any better.  She reports a history of polyneuropathy in the legs.  She is seeing a podiatrist for her feet.  She is unsure if she is seeing a neurologist.  It appears reviewing the chart which I reviewed many pages of notes from Dr. Holly Bodily that she was also having significant issues with neck pain several months ago and went through physical therapy.  She also had a referral in place for Dr. Erline Levine for her neck but I am not sure if she saw him.  Cervical spine x-rays have been performed at that time but no cervical MRI.  In terms of her low back she has not had any recent imaging of the lumbar spine whether it be plain film x-ray or MRI.  I also do not see any electrodiagnostic study of the lower limbs.  There was a CT of the abdomen and pelvis performed in 2019 in brief review of that today looking at the pictures did not show any high-grade bony stenosis or any worrisome problems there.  She does endorse generalized weakness but no focal weakness.  She has had some physical  therapy in the past and has tried activity modification.  Her case is complicated by obesity and again insulin-dependent diabetes.  It appears that her request was also made of Dr. Merlene Laughter in terms of pain management.  We spoke at length today the differences between pain management and nonsurgical spine and pain care which is what we perform.  Review of Systems  Constitutional: Negative for chills, fever, malaise/fatigue and weight loss.  HENT: Negative for hearing loss and sinus pain.   Eyes: Negative for blurred vision, double vision and photophobia.  Respiratory: Negative for cough and shortness of breath.   Cardiovascular: Negative for chest pain, palpitations and leg swelling.  Gastrointestinal: Negative for abdominal pain, nausea and vomiting.  Genitourinary: Negative for flank pain.  Musculoskeletal: Positive for back pain and joint pain. Negative for myalgias.  Skin: Negative for itching and rash.  Neurological: Positive for tingling and weakness. Negative for tremors and focal weakness.  Endo/Heme/Allergies: Negative.   Psychiatric/Behavioral: Negative for depression.  All other systems reviewed and are negative.  Otherwise per HPI.  Assessment & Plan: Visit Diagnoses:  1. Chronic bilateral low back pain with bilateral sciatica   2. Radiculopathy, lumbar region   3. Other spondylosis with radiculopathy, lumbar region   4. Diabetes mellitus due to underlying condition with diabetic neuropathy, with long-term current use  of insulin ([place])   5. Class 3 severe obesity due to excess calories without serious comorbidity in adult, unspecified BMI (HCC)     Plan: Findings:  Chronic worsening severe 8 out of 10 and low back pain with some referral into the thighs consistent seemingly with the potential for her lumbar stenosis.  X-ray imaging is at least hopeful that there is not a great deal of spine pathology overall as she has good anatomic alignment with good disc spacing but there  could still be some level of multifactorial stenosis.  Sometimes we do see with insulin-dependent diabetics with obesity some level of epidural lipomatosis which could cause similar complaints.  I think the first step before we can really treat this is to get an MRI of the lumbar spine.  She does have some potential again for stenosis and I think we need to look at that before treatment begins.  Alternatively could regroup with physical therapy.  May benefit from medication management and I think she does have a referral in for Dr. Merlene Laughter.  I would be happy to look at adjunct of treatment if needed from a medicine standpoint.  She will continue to follow-up with her primary care physician and they may want to look at referral for neuropathy.  Dr. Merlene Laughter is a neurologist and they may wish to perform electrodiagnostic studies of those have not been performed.  Again her case really is complicated by morbid obesity and insulin-dependent diabetes and she is on anticoagulation.    Meds & Orders: No orders of the defined types were placed in this encounter.   Orders Placed This Encounter  Procedures  . XR Lumbar Spine Complete  . MR LUMBAR SPINE WO CONTRAST    Follow-up: Return for MRI review after completion.   Procedures: No procedures performed  No notes on file   Clinical History: No specialty comments available.   She reports that she has been smoking cigarettes. She has a 11.00 pack-year smoking history. She has never used smokeless tobacco.  Recent Labs    05/08/19 1530 08/27/19 1559  HGBA1C 11.4* 9.7*    Objective:  VS:  HT:5\' 5"  (165.1 cm)   WT:230 lb (104.3 kg)  BMI:38.27    BP:114/80  HR:70bpm  TEMP: ( )  RESP:  Physical Exam Vitals and nursing note reviewed.  Constitutional:      General: She is not in acute distress.    Appearance: Normal appearance. She is well-developed. She is obese.  HENT:     Head: Normocephalic and atraumatic.     Nose: Nose normal.      Mouth/Throat:     Mouth: Mucous membranes are moist.     Pharynx: Oropharynx is clear.  Eyes:     Conjunctiva/sclera: Conjunctivae normal.     Pupils: Pupils are equal, round, and reactive to light.  Cardiovascular:     Rate and Rhythm: Regular rhythm.  Pulmonary:     Effort: Pulmonary effort is normal. No respiratory distress.  Abdominal:     General: There is no distension.     Palpations: Abdomen is soft.     Tenderness: There is no guarding.  Musculoskeletal:     Cervical back: Normal range of motion and neck supple.     Right lower leg: No edema.     Left lower leg: No edema.     Comments: Patient ambulates without aid she has pain with going from sit to stand in full extension with facet loading.  She  has tenderness across the lower spine and greater trochanters but not exquisite.  No pain with hip rotation she has good distal strength.  She has some level of impaired sensation bilaterally more of a stocking distribution.  She has no clonus.  Skin:    General: Skin is warm and dry.     Findings: No erythema or rash.  Neurological:     General: No focal deficit present.     Mental Status: She is alert and oriented to person, place, and time.     Motor: No abnormal muscle tone.     Coordination: Coordination normal.     Gait: Gait normal.  Psychiatric:        Mood and Affect: Mood normal.        Behavior: Behavior normal.        Thought Content: Thought content normal.     Ortho Exam Imaging: Plain film x-rays: 4 view lumbar spine films show normal anatomic alignment without listhesis and good disc height throughout.  There is bilateral facet arthropathy L4-5 L5-S1 moderate.  Hips look aligned without fracture or dislocation right hip has mild degenerative change compared to left.  There is no spondylolysis noted.  Past Medical/Family/Surgical/Social History: Medications & Allergies reviewed per EMR, new medications updated. Patient Active Problem List   Diagnosis Date  Noted  . Chronic bilateral low back pain with bilateral sciatica 10/09/2019  . Other spondylosis with radiculopathy, lumbar region 10/09/2019  . Neck pain 05/22/2019  . Gross hematuria 05/11/2019  . Candidiasis 05/11/2019  . Staph aureus infection 05/11/2019  . Pulmonary embolism without acute cor pulmonale ([place])   . Uncontrolled type 2 diabetes mellitus with hyperglycemia, with long-term current use of insulin ([place]) 08/04/2017  . Uncontrolled type 2 diabetes mellitus with diabetic polyneuropathy, with long-term current use of insulin ([place]) 08/04/2017  . Hypercalcemia 08/04/2017  . Obesity, Class III, BMI 40-49.9 (morbid obesity) ([place]) 08/04/2017  . Hypokalemia 08/04/2017  . Pulmonary embolism ([place]) 08/03/2017  . Sleep apnea 08/03/2017  . Encounter for screening colonoscopy   . Gastritis without bleeding   . Dysphagia 12/14/2016  . S/P laparoscopic hysterectomy 11/04/2015  . Endometrial cancer ([place]) 10/25/2015  . Diabetes mellitus with neuropathy ([place]) 10/25/2015  . Cancer of endometrium ([place]) 10/18/2015  . Heavy menses 10/11/2015  . COPD exacerbation ([place]) 07/04/2014  . Acute bronchitis 05/25/2013  . Fever 05/25/2013  . Asthma 05/25/2013  . Chronic obstructive pulmonary disease ([place]) 05/25/2013  . Tobacco abuse 05/25/2013  . Obesity 05/25/2013  . HTN (hypertension), benign 05/25/2013  . Hypercholesteremia 05/25/2013  . Pedal edema 05/25/2013  . Thyromegaly 05/25/2013  . Abdominal pain 04/11/2011  . Constipation 04/11/2011  . GERD (gastroesophageal reflux disease) 04/11/2011   Past Medical History:  Diagnosis Date  . Anemia   . Anxiety   . Arthritis   . Asthma   . Autonomic neuropathy   . Constipation   . COPD (chronic obstructive pulmonary disease) ([place])   . CTS (carpal tunnel syndrome)   . Depression   . Diabetes mellitus without complication ([place])   . Endometrial cancer ([place]) 2017  . GERD (gastroesophageal reflux disease)   . Headache(784.0)   . HTN  (hypertension)   . Hypercholesterolemia   . Hypothyroidism   . IBS (irritable bowel syndrome)   . Osteoarthritis    osteoarthritis  . Pancreatitis    per patient   . Recurrent boils    buttocks and low back.  . Shortness of breath   . Sleep apnea  CPAP machine  . Uterine cancer ([place]) 2017  . Vitamin D deficiency    Family History  Problem Relation Age of Onset  . Diabetes Mother   . Hypertension Mother   . COPD Mother   . Asthma Mother   . Hypercholesterolemia Mother   . Hypertension Sister   . Hyperlipidemia Sister   . Diabetes Sister   . Asthma Brother   . Alcohol abuse Brother   . Asthma Son   . Cancer Maternal Grandmother        lung, throat, breast  . Alzheimer's disease Maternal Grandmother   . Hypertension Maternal Grandmother   . Hypercholesterolemia Maternal Grandmother   . Heart disease Maternal Grandfather   . Stroke Maternal Grandfather   . Clotting disorder Maternal Grandfather        blood clots in legs  . Hypertension Sister   . Hyperlipidemia Sister   . Stroke Maternal Aunt   . Clotting disorder Maternal Aunt        blood clots in legs  . Hypertension Maternal Aunt   . Hypercholesterolemia Maternal Aunt   . Hypertension Maternal Aunt   . Asthma Maternal Aunt   . Clotting disorder Maternal Uncle        blood clot -legs traveled to heart and he died of heart attack  . Colon cancer Neg Hx    Past Surgical History:  Procedure Laterality Date  . ABDOMINAL HYSTERECTOMY  2018   endometrial cancer, surgery done at [facility]   . BIOPSY  01/02/2017   Procedure: BIOPSY;  Surgeon: Danie Binder, MD;  [facility];  Service: Endoscopy;;  gastric biopsy  . CESAREAN SECTION  1989  . COLONOSCOPY WITH PROPOFOL N/A 01/02/2017   Dr. Oneida Alar: redundant colon, two 2-3 mm polyps in sigmoid colon (hyperplastic)  . ESOPHAGOGASTRODUODENOSCOPY  05/22/2011   Dr. Oneida Alar: H.pylori gastritis   . ESOPHAGOGASTRODUODENOSCOPY (EGD) WITH PROPOFOL N/A 01/02/2017     Dr. 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 FOOT SURGERY Left    tendon repair  . OOPHORECTOMY    . POLYPECTOMY  01/02/2017   Procedure: POLYPECTOMY;  Surgeon: Danie Binder, MD;  [facility];  Service: Endoscopy;;  sigmoid colon polyps times 2  . REDUCTION MAMMAPLASTY  1996  . SAVORY DILATION N/A 01/02/2017   Procedure: SAVORY DILATION;  Surgeon: Danie Binder, MD;  [facility];  Service: Endoscopy;  Laterality: N/A;  . TUBAL LIGATION     Social History   Occupational History  . Not on file  Tobacco Use  . Smoking status: Current Some Day Smoker    Packs/day: 0.50    Years: 22.00    Pack years: 11.00    Types: Cigarettes  . Smokeless tobacco: Never Used  Substance and Sexual Activity  . Alcohol use: No  . Drug use: No  . Sexual activity: Yes    Birth control/protection: Surgical    Comment: hyst

## 2019-10-14 DIAGNOSIS — L600 Ingrowing nail: Secondary | ICD-10-CM | POA: Diagnosis not present

## 2019-10-14 DIAGNOSIS — Z7689 Persons encountering health services in other specified circumstances: Secondary | ICD-10-CM | POA: Diagnosis not present

## 2019-10-15 ENCOUNTER — Other Ambulatory Visit (HOSPITAL_COMMUNITY): Payer: Self-pay | Admitting: Family Medicine

## 2019-10-15 DIAGNOSIS — Z1231 Encounter for screening mammogram for malignant neoplasm of breast: Secondary | ICD-10-CM

## 2019-10-22 ENCOUNTER — Ambulatory Visit (HOSPITAL_COMMUNITY): Payer: Medicaid Other

## 2019-10-22 ENCOUNTER — Ambulatory Visit (HOSPITAL_COMMUNITY)
Admission: RE | Admit: 2019-10-22 | Discharge: 2019-10-22 | Disposition: A | Discharge status: Home / Self Care | Payer: Medicaid Other | Source: Ambulatory Visit | Attending: Family Medicine | Admitting: Family Medicine

## 2019-10-22 ENCOUNTER — Other Ambulatory Visit: Payer: Self-pay

## 2019-10-22 DIAGNOSIS — Z1231 Encounter for screening mammogram for malignant neoplasm of breast: Secondary | ICD-10-CM | POA: Diagnosis not present

## 2019-10-22 DIAGNOSIS — Z7689 Persons encountering health services in other specified circumstances: Secondary | ICD-10-CM | POA: Diagnosis not present

## 2019-10-28 DIAGNOSIS — Z7689 Persons encountering health services in other specified circumstances: Secondary | ICD-10-CM | POA: Diagnosis not present

## 2019-10-28 DIAGNOSIS — B351 Tinea unguium: Secondary | ICD-10-CM | POA: Diagnosis not present

## 2019-10-28 DIAGNOSIS — L600 Ingrowing nail: Secondary | ICD-10-CM | POA: Diagnosis not present

## 2019-10-29 ENCOUNTER — Ambulatory Visit (INDEPENDENT_AMBULATORY_CARE_PROVIDER_SITE_OTHER): Payer: Medicaid Other | Admitting: Physical Medicine and Rehabilitation

## 2019-10-29 ENCOUNTER — Ambulatory Visit: Payer: Self-pay

## 2019-10-29 ENCOUNTER — Other Ambulatory Visit: Payer: Self-pay

## 2019-10-29 ENCOUNTER — Encounter: Payer: Self-pay | Admitting: Physical Medicine and Rehabilitation

## 2019-10-29 VITALS — BP 171/86 | HR 74

## 2019-10-29 DIAGNOSIS — M47816 Spondylosis without myelopathy or radiculopathy, lumbar region: Secondary | ICD-10-CM | POA: Diagnosis not present

## 2019-10-29 DIAGNOSIS — Z7689 Persons encountering health services in other specified circumstances: Secondary | ICD-10-CM | POA: Diagnosis not present

## 2019-10-29 MED ORDER — METHYLPREDNISOLONE ACETATE 80 MG/ML IJ SUSP
40.0000 mg | Freq: Once | INTRAMUSCULAR | Status: AC
  Administered 2019-10-29: 40 mg

## 2019-10-29 NOTE — Progress Notes (Signed)
 .  Numeric Pain Rating Scale and Functional Assessment Average Pain 8   In the last MONTH (on 0-10 scale) has pain interfered with the following?  1. General activity like being  able to carry out your everyday physical activities such as walking, climbing stairs, carrying groceries, or moving a chair?  Rating(6)   +Driver, -BT, -Dye Allergies.  

## 2019-10-29 NOTE — Progress Notes (Signed)
Patient notified of the results, verbalized understanding.

## 2019-11-03 DIAGNOSIS — F411 Generalized anxiety disorder: Secondary | ICD-10-CM | POA: Diagnosis not present

## 2019-11-03 DIAGNOSIS — Z7689 Persons encountering health services in other specified circumstances: Secondary | ICD-10-CM | POA: Diagnosis not present

## 2019-11-04 ENCOUNTER — Ambulatory Visit: Payer: Medicaid Other | Admitting: Orthopaedic Surgery

## 2019-11-04 ENCOUNTER — Encounter: Payer: Self-pay | Admitting: Orthopaedic Surgery

## 2019-11-04 ENCOUNTER — Other Ambulatory Visit: Payer: Self-pay

## 2019-11-04 DIAGNOSIS — M25561 Pain in right knee: Secondary | ICD-10-CM

## 2019-11-04 DIAGNOSIS — G8929 Other chronic pain: Secondary | ICD-10-CM | POA: Diagnosis not present

## 2019-11-04 DIAGNOSIS — M25562 Pain in left knee: Secondary | ICD-10-CM | POA: Diagnosis not present

## 2019-11-04 DIAGNOSIS — Z7689 Persons encountering health services in other specified circumstances: Secondary | ICD-10-CM | POA: Diagnosis not present

## 2019-11-04 DIAGNOSIS — F17210 Nicotine dependence, cigarettes, uncomplicated: Secondary | ICD-10-CM

## 2019-11-04 DIAGNOSIS — Z6841 Body Mass Index (BMI) 40.0 and over, adult: Secondary | ICD-10-CM

## 2019-11-04 MED ORDER — HYDROCODONE-ACETAMINOPHEN 5-325 MG PO TABS: ORAL_TABLET | ORAL | 0 refills | Status: DC

## 2019-11-04 NOTE — Progress Notes (Signed)
PROCEDURE NOTE:  The patient requests injections of the left knee , verbal consent was obtained.  The left knee was prepped appropriately after time out was performed.   Sterile technique was observed and injection of 1 cc of Depo-Medrol 40 mg with several cc's of plain xylocaine. Anesthesia was provided by ethyl chloride and a 20-gauge needle was used to inject the knee area. The injection was tolerated well.  A band aid dressing was applied.  The patient was advised to apply ice later today and tomorrow to the injection sight as needed.  PROCEDURE NOTE:  The patient requests injections of the right knee , verbal consent was obtained.  The right knee was prepped appropriately after time out was performed.   Sterile technique was observed and injection of 1 cc of Depo-Medrol 40 mg with several cc's of plain xylocaine. Anesthesia was provided by ethyl chloride and a 20-gauge needle was used to inject the knee area. The injection was tolerated well.  A band aid dressing was applied.  The patient was advised to apply ice later today and tomorrow to the injection sight as needed.  Encounter Diagnoses  Name Primary?  . Chronic pain of left knee Yes  . Chronic pain of right knee   . Cigarette nicotine dependence without complication   . Body mass index 40.0-44.9, adult ([place])   . Morbid obesity ([place])    I have reviewed the [place] web site prior to prescribing narcotic medicine for this patient.   Call if any problem.  Precautions discussed.   Return in two months.  Electronically Signed Sanjuana Kava, MD 2/23/20218:47 AM

## 2019-11-04 NOTE — Patient Instructions (Signed)
Steps to Quit Smoking Smoking tobacco is the leading cause of preventable death. It can affect almost every organ in the body. Smoking puts you and people around you at risk for many serious, long-lasting (chronic) diseases. Quitting smoking can be hard, but it is one of the best things that you can do for your health. It is never too late to quit. How do I get ready to quit? When you decide to quit smoking, make a plan to help you succeed. Before you quit:  Pick a date to quit. Set a date within the next 2 weeks to give you time to prepare.  Write down the reasons why you are quitting. Keep this list in places where you will see it often.  Tell your family, friends, and co-workers that you are quitting. Their support is important.  Talk with your doctor about the choices that may help you quit.  Find out if your health insurance will pay for these treatments.  Know the people, places, things, and activities that make you want to smoke (triggers). Avoid them. What first steps can I take to quit smoking?  Throw away all cigarettes at home, at work, and in your car.  Throw away the things that you use when you smoke, such as ashtrays and lighters.  Clean your car. Make sure to empty the ashtray.  Clean your home, including curtains and carpets. What can I do to help me quit smoking? Talk with your doctor about taking medicines and seeing a counselor at the same time. You are more likely to succeed when you do both.  If you are pregnant or breastfeeding, talk with your doctor about counseling or other ways to quit smoking. Do not take medicine to help you quit smoking unless your doctor tells you to do so. To quit smoking: Quit right away  Quit smoking totally, instead of slowly cutting back on how much you smoke over a period of time.  Go to counseling. You are more likely to quit if you go to counseling sessions regularly. Take medicine You may take medicines to help you quit. Some  medicines need a prescription, and some you can buy over-the-counter. Some medicines may contain a drug called nicotine to replace the nicotine in cigarettes. Medicines may:  Help you to stop having the desire to smoke (cravings).  Help to stop the problems that come when you stop smoking (withdrawal symptoms). Your doctor may ask you to use:  Nicotine patches, gum, or lozenges.  Nicotine inhalers or sprays.  Non-nicotine medicine that is taken by mouth. Find resources Find resources and other ways to help you quit smoking and remain smoke-free after you quit. These resources are most helpful when you use them often. They include:  Online chats with a counselor.  Phone quitlines.  Printed self-help materials.  Support groups or group counseling.  Text messaging programs.  Mobile phone apps. Use apps on your mobile phone or tablet that can help you stick to your quit plan. There are many free apps for mobile phones and tablets as well as websites. Examples include Quit Guide from the CDC and smokefree.gov  What things can I do to make it easier to quit?   Talk to your family and friends. Ask them to support and encourage you.  Call a phone quitline (1-800-QUIT-NOW), reach out to support groups, or work with a counselor.  Ask people who smoke to not smoke around you.  Avoid places that make you want to smoke,   such as: ? Bars. ? Parties. ? Smoke-break areas at work.  Spend time with people who do not smoke.  Lower the stress in your life. Stress can make you want to smoke. Try these things to help your stress: ? Getting regular exercise. ? Doing deep-breathing exercises. ? Doing yoga. ? Meditating. ? Doing a body scan. To do this, close your eyes, focus on one area of your body at a time from head to toe. Notice which parts of your body are tense. Try to relax the muscles in those areas. How will I feel when I quit smoking? Day 1 to 3 weeks Within the first 24 hours,  you may start to have some problems that come from quitting tobacco. These problems are very bad 2-3 days after you quit, but they do not often last for more than 2-3 weeks. You may get these symptoms:  Mood swings.  Feeling restless, nervous, angry, or annoyed.  Trouble concentrating.  Dizziness.  Strong desire for high-sugar foods and nicotine.  Weight gain.  Trouble pooping (constipation).  Feeling like you may vomit (nausea).  Coughing or a sore throat.  Changes in how the medicines that you take for other issues work in your body.  Depression.  Trouble sleeping (insomnia). Week 3 and afterward After the first 2-3 weeks of quitting, you may start to notice more positive results, such as:  Better sense of smell and taste.  Less coughing and sore throat.  Slower heart rate.  Lower blood pressure.  Clearer skin.  Better breathing.  Fewer sick days. Quitting smoking can be hard. Do not give up if you fail the first time. Some people need to try a few times before they succeed. Do your best to stick to your quit plan, and talk with your doctor if you have any questions or concerns. Summary  Smoking tobacco is the leading cause of preventable death. Quitting smoking can be hard, but it is one of the best things that you can do for your health.  When you decide to quit smoking, make a plan to help you succeed.  Quit smoking right away, not slowly over a period of time.  When you start quitting, seek help from your doctor, family, or friends. This information is not intended to replace advice given to you by your health care provider. Make sure you discuss any questions you have with your health care provider. Document Revised: 05/23/2019 Document Reviewed: 11/16/2018 Elsevier Patient Education  2020 Elsevier Inc.  

## 2019-11-05 DIAGNOSIS — F411 Generalized anxiety disorder: Secondary | ICD-10-CM | POA: Diagnosis not present

## 2019-11-05 DIAGNOSIS — F321 Major depressive disorder, single episode, moderate: Secondary | ICD-10-CM | POA: Diagnosis not present

## 2019-11-05 DIAGNOSIS — Z79899 Other long term (current) drug therapy: Secondary | ICD-10-CM | POA: Diagnosis not present

## 2019-11-05 DIAGNOSIS — G4700 Insomnia, unspecified: Secondary | ICD-10-CM | POA: Diagnosis not present

## 2019-11-05 DIAGNOSIS — Z0189 Encounter for other specified special examinations: Secondary | ICD-10-CM | POA: Diagnosis not present

## 2019-11-05 DIAGNOSIS — Z7689 Persons encountering health services in other specified circumstances: Secondary | ICD-10-CM | POA: Diagnosis not present

## 2019-11-12 DIAGNOSIS — E039 Hypothyroidism, unspecified: Secondary | ICD-10-CM | POA: Diagnosis not present

## 2019-11-12 DIAGNOSIS — Z7689 Persons encountering health services in other specified circumstances: Secondary | ICD-10-CM | POA: Diagnosis not present

## 2019-11-12 DIAGNOSIS — G2581 Restless legs syndrome: Secondary | ICD-10-CM | POA: Diagnosis not present

## 2019-11-12 DIAGNOSIS — G609 Hereditary and idiopathic neuropathy, unspecified: Secondary | ICD-10-CM | POA: Diagnosis not present

## 2019-11-12 DIAGNOSIS — E559 Vitamin D deficiency, unspecified: Secondary | ICD-10-CM | POA: Diagnosis not present

## 2019-11-12 DIAGNOSIS — M25569 Pain in unspecified knee: Secondary | ICD-10-CM | POA: Diagnosis not present

## 2019-11-12 DIAGNOSIS — M545 Low back pain: Secondary | ICD-10-CM | POA: Diagnosis not present

## 2019-11-12 DIAGNOSIS — Z79899 Other long term (current) drug therapy: Secondary | ICD-10-CM | POA: Diagnosis not present

## 2019-11-12 DIAGNOSIS — G894 Chronic pain syndrome: Secondary | ICD-10-CM | POA: Diagnosis not present

## 2019-11-12 DIAGNOSIS — M542 Cervicalgia: Secondary | ICD-10-CM | POA: Diagnosis not present

## 2019-11-13 DIAGNOSIS — Z79899 Other long term (current) drug therapy: Secondary | ICD-10-CM | POA: Diagnosis not present

## 2019-12-04 DIAGNOSIS — Z23 Encounter for immunization: Secondary | ICD-10-CM | POA: Diagnosis not present

## 2019-12-08 DIAGNOSIS — F411 Generalized anxiety disorder: Secondary | ICD-10-CM | POA: Diagnosis not present

## 2019-12-08 DIAGNOSIS — F321 Major depressive disorder, single episode, moderate: Secondary | ICD-10-CM | POA: Diagnosis not present

## 2019-12-08 DIAGNOSIS — Z5181 Encounter for therapeutic drug level monitoring: Secondary | ICD-10-CM | POA: Diagnosis not present

## 2019-12-08 DIAGNOSIS — Z79899 Other long term (current) drug therapy: Secondary | ICD-10-CM | POA: Diagnosis not present

## 2019-12-08 DIAGNOSIS — Z7689 Persons encountering health services in other specified circumstances: Secondary | ICD-10-CM | POA: Diagnosis not present

## 2019-12-09 ENCOUNTER — Other Ambulatory Visit: Payer: Self-pay

## 2019-12-09 ENCOUNTER — Encounter: Payer: Self-pay | Admitting: Family Medicine

## 2019-12-09 ENCOUNTER — Ambulatory Visit (INDEPENDENT_AMBULATORY_CARE_PROVIDER_SITE_OTHER): Payer: Medicaid Other | Admitting: Family Medicine

## 2019-12-09 VITALS — BP 175/98 | HR 64 | Temp 97.6°F | Ht 65.0 in | Wt 248.6 lb

## 2019-12-09 DIAGNOSIS — I10 Essential (primary) hypertension: Secondary | ICD-10-CM | POA: Diagnosis not present

## 2019-12-09 DIAGNOSIS — E559 Vitamin D deficiency, unspecified: Secondary | ICD-10-CM | POA: Diagnosis not present

## 2019-12-09 DIAGNOSIS — I2699 Other pulmonary embolism without acute cor pulmonale: Secondary | ICD-10-CM

## 2019-12-09 DIAGNOSIS — E7800 Pure hypercholesterolemia, unspecified: Secondary | ICD-10-CM | POA: Diagnosis not present

## 2019-12-09 DIAGNOSIS — Z720 Tobacco use: Secondary | ICD-10-CM

## 2019-12-09 DIAGNOSIS — E876 Hypokalemia: Secondary | ICD-10-CM

## 2019-12-09 NOTE — Patient Instructions (Addendum)
   Increase zestril to 40mg  , continue to take Lasix daily, Klor-Con daily  If you have lab work done today you will be contacted with your lab results within the next 2 weeks.  If you have not heard from [place] then please contact us. The fastest way to get your results is to register for My Chart.   IF you received an x-ray today, you will receive an invoice from [facility]. Please contact [facility] at [phone] with questions or concerns regarding your invoice.   IF you received labwork today, you will receive an invoice from [facility]. Please contact [facility] at [phone] with questions or concerns regarding your invoice.   Our billing staff will not be able to assist you with questions regarding bills from these companies.  You will be contacted with the lab results as soon as they are available. The fastest way to get your results is to activate your My Chart account. Instructions are located on the last page of this paperwork. If you have not heard from [place] regarding the results in 2 weeks, please contact this office.

## 2019-12-09 NOTE — Progress Notes (Signed)
Established Patient Office Visit  Subjective:  Patient ID: Sonya James, female    DOB: 1965-09-19  Age: 54 y.o. MRN: [id number] Prescriptions Total Prescriptions: 16   Total Private Pay: 0   Fill Date ID   Written Drug Qty Days Prescriber Rx # Pharmacy Refill   Daily Dose* Pymt Type PMP    11/04/2019  1   11/04/2019  Hydrocodone-Acetamin 5-325 MG  28.00  7 J Kee   [id number]   [id number]   0  20.00 MME  Medicaid   [place]  09/02/2019  1   09/02/2019  Hydrocodone-Acetamin 5-325 MG  28.00  7 J Kee   [id number]   [id number]   0  20.00 MME  Medicaid   [place]  06/24/2019  1   06/24/2019  Hydrocodone-Acetamin 5-325 MG  28.00  7 J Kee   [id number]   [id number]   0  20.00 MME  Medicaid   [place]  05/02/2019  1   05/02/2019  Oxycodone-Acetaminophen 5-325  10.00  3 Ho Bry   [id number]   [id number]   0  25.00 MME  Medicaid   [place]  04/22/2019  1   04/22/2019  Hydrocodone-Acetamin 5-325 MG  28.00  7 J Kee   [id number]   [id number]   0  20.00 MME  Medicaid   [place]  04/09/2019  1   04/08/2019  Hydrocodone-Acetamin 5-325 MG  25.00  7 J Kee   [id number]   [id number]   0  17.86 MME  Medicaid   [place]  03/17/2019  1   03/17/2019  Hydrocodone-Acetamin 5-325 MG  8.00  2 Ta Tri   [id number]   [id number]   0  20.00 MME  Medicaid   [place]  02/26/2019  1   02/25/2019  Hydrocodone-Acetamin 5-325 MG  25.00  7 J Kee   [id number]   [id number]   0  17.86 MME  Medicaid   [place]  02/19/2019  1   02/19/2019  Hydrocodone-Acetamin 5-325 MG  25.00  7 J Kee   [id number]   [id number]   0  17.86 MME  Medicaid   [place]  01/08/2019  1   01/08/2019  Hydrocodone-Acetamin 5-325 MG  25.00  7 J Kee   [id number]   [facility]   0  17.86 MME  Medicaid   [place]  12/03/2018  1   12/03/2018  Hydrocodone-Acetamin 5-325 MG  25.00  7 J Kee   [id number]   [id number]   0  17.86 MME  Medicaid   [place]  11/20/2018  1   11/20/2018  Hydrocodone-Acetamin 5-325 MG  28.00  7 J Kee   [id number]   [id number]   0  20.00 MME  Medicaid   [place]    CC:  Chief Complaint  Patient presents with  . Follow-up    3 month f/u on DDD   endo-diabetes f/u-no appt due to change in insurance  HPI Sonya James presents for :HTN-needs bp meds refilled  bilat knee pain-injections Dr. Keeling-chronic knee pain-surgery in the future-oral pain medications  DDD-Dr. Newton-injections in lumbar disc-no surgery recommended  HTN-zestril 20mg , lasix 40mg  + K-pt does not check bp at home  tob use-taking Chantix 2-3/day-cig  Hyperlipidemia-lipitor daily-recently started after labwork completed in Dec 2020  Fibro/neuropathy-neurology treating with Neurontin and ? Other medication  GERD-Nexium daily-gastritis-egd 2018, IBS-Linzess -Dr. Oneida Alar following  Pap smear -2019-normal  Colonoscopy-polyps, hemorrhoids-2018  Vit D -def-pt not  currently taking extra vitamins-took weekly in the past-needs level  PE-no known source, Eliquis  Dry eyes-Restasis, [facility]-no glaucoma-cataracts- appt-May 2021  Asthma-ventolin  MDI BID, no use of nebulizer, singulair daily-well controlled currently  Pt received first COVID vaccine  Past Medical History:  Diagnosis Date  . Anemia   . Anxiety   . Arthritis   . Asthma   . Autonomic neuropathy   . Constipation   . COPD (chronic obstructive pulmonary disease) ([place])   . CTS (carpal tunnel syndrome)   . Depression   . Diabetes mellitus without complication ([place])   . Endometrial cancer ([place]) 2017  . GERD (gastroesophageal reflux disease)   . Headache(784.0)   . HTN (hypertension)   . Hypercholesterolemia   . Hypothyroidism   . IBS (irritable bowel syndrome)   . Osteoarthritis    osteoarthritis  . Pancreatitis    per patient   . Recurrent boils    buttocks and low back.  . Shortness of breath   . Sleep apnea    CPAP machine  . Uterine cancer ([place]) 2017  . Vitamin D deficiency     Past Surgical History:  Procedure Laterality Date  . ABDOMINAL HYSTERECTOMY  2018   endometrial cancer, surgery done at [facility]   . BIOPSY  01/02/2017   Procedure: BIOPSY;  Surgeon: Danie Binder, MD;  [facility];  Service: Endoscopy;;  gastric biopsy  . CESAREAN SECTION  1989  . COLONOSCOPY WITH PROPOFOL N/A 01/02/2017   Dr. Oneida Alar: redundant colon, two 2-3 mm polyps in sigmoid colon (hyperplastic)  . ESOPHAGOGASTRODUODENOSCOPY  05/22/2011   Dr. Oneida Alar: H.pylori gastritis   . ESOPHAGOGASTRODUODENOSCOPY (EGD) WITH PROPOFOL N/A 01/02/2017   Dr. 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 FOOT SURGERY Left    tendon repair  . OOPHORECTOMY    . POLYPECTOMY  01/02/2017   Procedure: POLYPECTOMY;  Surgeon: Danie Binder, MD;  Location: [facility];  Service: Endoscopy;;  sigmoid colon polyps times 2  . REDUCTION MAMMAPLASTY  1996  . SAVORY DILATION N/A 01/02/2017   Procedure: SAVORY DILATION;  Surgeon: Danie Binder, MD;  Location: [facility];  Service: Endoscopy;  Laterality: N/A;  . TUBAL LIGATION      Family History  Problem Relation Age of Onset  . Diabetes Mother   . Hypertension Mother   . COPD Mother   . Asthma Mother   . Hypercholesterolemia Mother   . Hypertension Sister   . Hyperlipidemia Sister   . Diabetes Sister   . Asthma Brother   . Alcohol abuse Brother   . Asthma Son   . Cancer Maternal Grandmother        lung, throat, breast  . Alzheimer's disease Maternal Grandmother   . Hypertension Maternal Grandmother   . Hypercholesterolemia Maternal Grandmother   . Heart disease Maternal Grandfather   . Stroke Maternal Grandfather   . Clotting disorder Maternal Grandfather        blood clots in legs  . Hypertension Sister   . Hyperlipidemia Sister   . Stroke Maternal Aunt   . Clotting disorder Maternal Aunt        blood clots in legs  . Hypertension Maternal Aunt   . Hypercholesterolemia Maternal Aunt   . Hypertension Maternal Aunt   . Asthma Maternal Aunt   . Clotting disorder Maternal Uncle        blood clot -legs traveled to heart and  he died of heart attack  . Colon cancer  Neg Hx     Social History   Socioeconomic History  . Marital status: Married    Spouse name: Not on file  . Number of children: 1  . Years of education: Not on file  . Highest education level: Not on file  Occupational History  . Not on file  Tobacco Use  . Smoking status: Current Some Day Smoker    Packs/day: 0.50    Years: 22.00    Pack years: 11.00    Types: Cigarettes  . Smokeless tobacco: Never Used  Substance and Sexual Activity  . Alcohol use: No  . Drug use: No  . Sexual activity: Yes    Birth control/protection: Surgical    Comment: hyst  Other Topics Concern  . Not on file  Social History Narrative   Drinks coffee occasionally, soda 2-3 daily    Social Determinants of Health   Financial Resource Strain:   . Difficulty of Paying Living Expenses:   Food Insecurity:   . Worried About Charity fundraiser in the Last Year:   . Arboriculturist in the Last Year:   Transportation Needs:   . Film/video editor (Medical):   [name] Lack of Transportation (Non-Medical):   Physical Activity:   . Days of Exercise per Week:   . Minutes of Exercise per Session:   Stress:   . Feeling of Stress :   Social Connections:   . Frequency of Communication with Friends and Family:   . Frequency of Social Gatherings with Friends and Family:   . Attends Religious Services:   . Active Member of Clubs or Organizations:   . Attends Archivist Meetings:   [name] Marital Status:   Intimate Partner Violence:   . Fear of Current or Ex-Partner:   . Emotionally Abused:   [name] Physically Abused:   . Sexually Abused:     Outpatient Medications Prior to Visit  Medication Sig Dispense Refill  . albuterol (PROVENTIL) (2.5 MG/3ML) 0.083% nebulizer solution Take 3 mLs (2.5 mg total) by nebulization every 6 (six) hours as needed for wheezing. 75 mL 12  . albuterol (VENTOLIN HFA) 108 (90 Base) MCG/ACT inhaler Inhale 2 puffs into the lungs every 6 (six) hours as needed for wheezing or  shortness of breath. 18 g 5  . apixaban (ELIQUIS) 5 MG TABS tablet Take 1 tablet (5 mg total) by mouth 2 (two) times daily. On 08/12/17 @ 9PM, start 1 tab (5 mg) two times daily. 60 tablet 5  . atorvastatin (LIPITOR) 40 MG tablet Take 1 tablet (40 mg total) by mouth daily. 30 tablet 5  . chlorhexidine (HIBICLENS) 4 % external liquid Apply topically daily as needed. Wash area of abscess 473 mL 0  . cycloSPORINE (RESTASIS) 0.05 % ophthalmic emulsion Place 1 drop into both eyes daily.    [name] doxycycline (VIBRAMYCIN) 100 MG capsule Take 1 capsule (100 mg total) by mouth 2 (two) times daily. 14 capsule 0  . esomeprazole (NEXIUM) 40 MG capsule Take 1 capsule (40 mg total) by mouth 2 (two) times daily before a meal. 60 capsule 11  . Ferrous Gluconate-C-Folic Acid (IRON-C PO) Take by mouth.    . furosemide (LASIX) 40 MG tablet Take 1 tablet (40 mg total) by mouth daily. 30 tablet 5  . gabapentin (NEURONTIN) 300 MG capsule Take 300 mg by mouth 3 (three) times daily.    [name] HYDROcodone-acetaminophen (NORCO/VICODIN) 5-325 MG tablet  One tablet every six hours for pain.  Limit 7 days. 28 tablet 0  . hydrocortisone 1 % ointment Apply 1 application topically 2 (two) times daily. 30 g prn  . insulin NPH-regular Human (NOVOLIN 70/30 RELION) (70-30) 100 UNIT/ML injection Inject 50 units into skin three times daily (breakfast, supper, bedtime) 10 mL 1  . JARDIANCE 25 MG TABS tablet Take 25 mg by mouth daily.    [name] linaclotide (LINZESS) 290 MCG CAPS capsule Take 1 capsule (290 mcg total) by mouth daily before breakfast. 90 capsule 3  . lisinopril (ZESTRIL) 20 MG tablet Take 1 tablet (20 mg total) by mouth daily. 30 tablet 5  . metFORMIN (GLUCOPHAGE) 1000 MG tablet Take 1 tablet (1,000 mg total) by mouth 2 (two) times daily with a meal. 60 tablet 0  . methocarbamol (ROBAXIN) 500 MG tablet Take 1 tablet (500 mg total) by mouth 2 (two) times daily. 20 tablet 0  . montelukast (SINGULAIR) 10 MG tablet Take 1 tablet (10 mg  total) by mouth at bedtime. 30 tablet 5  . mupirocin ointment (BACTROBAN) 2 % Place 1 application into the nose daily. 22 g 0  . naproxen (NAPROSYN) 500 MG tablet Take 500 mg by mouth as needed.   5  . ondansetron (ZOFRAN ODT) 4 MG disintegrating tablet Take 4 mg by mouth as needed for nausea or vomiting.     . potassium chloride (KLOR-CON) 10 MEQ tablet Take 1 tablet (10 mEq total) by mouth every evening. 30 tablet 5  . silver sulfADIAZINE (SILVADENE) 1 % cream Apply 1 application topically daily. (Patient taking differently: Apply 1 application topically as needed. ) 50 g 0  . varenicline (CHANTIX CONTINUING MONTH PAK) 1 MG tablet Take 1 tablet (1 mg total) by mouth 2 (two) times daily. 60 tablet 2  . Vitamin D, Ergocalciferol, (DRISDOL) 50000 units CAPS capsule Take 50,000 Units by mouth every 7 (seven) days.    . fluconazole (DIFLUCAN) 150 MG tablet TAKE ONE TABLET BY MOUTH EVERY 3RD DAY 3 tablet 0  . sulfamethoxazole-trimethoprim (BACTRIM) 400-80 MG tablet Take 1 tablet by mouth 2 (two) times daily. 20 tablet 0   No facility-administered medications prior to visit.    No Known Allergies  ROS Review of Systems  Constitutional: Negative.   HENT: Negative.   Eyes: Positive for itching.       MY EYE DOCTOR  Respiratory:       Asthma-no recent PFT  Cardiovascular: Negative.   Gastrointestinal:       IBS, GERD  Endocrine:       DM  Genitourinary: Negative.   Musculoskeletal: Positive for arthralgias and neck pain.  Skin:       Abscesses in the past  Neurological:       Fibro-sees neuro  Hematological: Negative.   Psychiatric/Behavioral: Negative.       Objective:    Physical Exam  Constitutional: She is oriented to person, place, and time. She appears well-developed and well-nourished.  HENT:  Head: Normocephalic and atraumatic.  Cardiovascular: Normal rate, regular rhythm, normal heart sounds and intact distal pulses.  Pulmonary/Chest: Effort normal and breath sounds  normal.  Neurological: She is oriented to person, place, and time.  Psychiatric: She has a normal mood and affect. Her behavior is normal.    BP (!) 175/98 (BP Location: Left Arm, Patient Position: Sitting, Cuff Size: Large)   Pulse 64   Temp 97.6 F (36.4 C) (Temporal)   Ht 5\' 5"  (1.651 m)  Wt 248 lb 9.6 oz (112.8 kg)   LMP 09/20/2015 (Exact Date)   SpO2 97%   BMI 41.37 kg/m  Wt Readings from Last 3 Encounters:  12/09/19 248 lb 9.6 oz (112.8 kg)  09/24/19 230 lb (104.3 kg)  09/10/19 234 lb 12.8 oz (106.5 kg)     Health Maintenance Due  Topic Date Due  . FOOT EXAM  Never done  . OPHTHALMOLOGY EXAM  Never done  . TETANUS/TDAP  Never done    Lab Results  Component Value Date   TSH 2.507 08/27/2019   Lab Results  Component Value Date   WBC 7.7 08/27/2019   HGB 15.3 (H) 08/27/2019   HCT 48.7 (H) 08/27/2019   MCV 97.4 08/27/2019   PLT 356 08/27/2019   Lab Results  Component Value Date   NA 142 08/27/2019   K 4.0 08/27/2019   CO2 30 08/27/2019   GLUCOSE 146 (H) 08/27/2019   BUN 9 08/27/2019   CREATININE 0.89 08/27/2019   BILITOT 0.6 08/27/2019   ALKPHOS 92 08/27/2019   AST 14 (L) 08/27/2019   ALT 13 08/27/2019   PROT 7.4 08/27/2019   ALBUMIN 4.0 08/27/2019   CALCIUM 10.1 08/27/2019   ANIONGAP 7 08/27/2019   Lab Results  Component Value Date   CHOL 234 (H) 08/27/2019   Lab Results  Component Value Date   HDL 35 (L) 08/27/2019   Lab Results  Component Value Date   LDLCALC 151 (H) 08/27/2019   Lab Results  Component Value Date   TRIG 238 (H) 08/27/2019   Lab Results  Component Value Date   CHOLHDL 6.7 08/27/2019   Lab Results  Component Value Date   HGBA1C 9.7 (H) 08/27/2019      Assessment & Plan:  1. Hypokalemia CMP 2. Tobacco abuse Chantix-encouraged to quit-smoking 2-3/day 3. HTN (hypertension), benign Increase zestril to 40mg -pt to recheck blood pressure at home-needs new primary care physician Continue lasix and [place]-Con-CMP 4.  Hypercholesteremia Lipid panel 5. Other pulmonary embolism without acute cor pulmonale, unspecified chronicity (HCC) eliquis 6. Vitamin D deficiency - VITAMIN D 25 Hydroxy (Vit-D Deficiency, Fractures) Follow-up: find new Primary care physician Elizebath Wever Hannah Beat, MD

## 2019-12-10 ENCOUNTER — Other Ambulatory Visit: Payer: Self-pay | Admitting: Family Medicine

## 2019-12-10 DIAGNOSIS — M545 Low back pain: Secondary | ICD-10-CM | POA: Diagnosis not present

## 2019-12-10 DIAGNOSIS — Z7689 Persons encountering health services in other specified circumstances: Secondary | ICD-10-CM | POA: Diagnosis not present

## 2019-12-10 DIAGNOSIS — E1140 Type 2 diabetes mellitus with diabetic neuropathy, unspecified: Secondary | ICD-10-CM | POA: Diagnosis not present

## 2019-12-10 DIAGNOSIS — M25569 Pain in unspecified knee: Secondary | ICD-10-CM | POA: Diagnosis not present

## 2019-12-10 DIAGNOSIS — M542 Cervicalgia: Secondary | ICD-10-CM | POA: Diagnosis not present

## 2019-12-10 DIAGNOSIS — E0840 Diabetes mellitus due to underlying condition with diabetic neuropathy, unspecified: Secondary | ICD-10-CM

## 2019-12-10 DIAGNOSIS — M79606 Pain in leg, unspecified: Secondary | ICD-10-CM | POA: Diagnosis not present

## 2019-12-11 DIAGNOSIS — G609 Hereditary and idiopathic neuropathy, unspecified: Secondary | ICD-10-CM | POA: Insufficient documentation

## 2019-12-11 DIAGNOSIS — G2581 Restless legs syndrome: Secondary | ICD-10-CM | POA: Insufficient documentation

## 2019-12-11 DIAGNOSIS — E559 Vitamin D deficiency, unspecified: Secondary | ICD-10-CM | POA: Insufficient documentation

## 2019-12-16 ENCOUNTER — Telehealth: Payer: Self-pay | Admitting: Family Medicine

## 2019-12-16 ENCOUNTER — Other Ambulatory Visit: Payer: Self-pay | Admitting: Family Medicine

## 2019-12-16 ENCOUNTER — Other Ambulatory Visit: Payer: Self-pay | Admitting: Emergency Medicine

## 2019-12-16 DIAGNOSIS — E0840 Diabetes mellitus due to underlying condition with diabetic neuropathy, unspecified: Secondary | ICD-10-CM

## 2019-12-16 MED ORDER — LISINOPRIL 40 MG PO TABS: 40.0000 mg | ORAL_TABLET | Freq: Every day | ORAL | 1 refills | Status: DC

## 2019-12-16 MED ORDER — METFORMIN HCL 1000 MG PO TABS: 1000.0000 mg | ORAL_TABLET | Freq: Two times a day (BID) | ORAL | 0 refills | Status: DC

## 2019-12-16 NOTE — Telephone Encounter (Signed)
Patient want a refill on med and she stated you increase the dose on her lisinopril but I did not see where you talked about the increase. Is it ok to increase this med

## 2019-12-16 NOTE — Telephone Encounter (Signed)
Patient was seen in the office on 3/30. She is requesting a refill on the following medications.   metFORMIN (GLUCOPHAGE) 1000 MG tablet   lisinopril (ZESTRIL) 20 MG tablet  Pt states when she was seen on the 30th that the 20mg  was increased to 40mg .   Please advise.   [place], [place] - [id number] [place] [address] [place] Phone:  [phone]  Fax:  [phone]

## 2019-12-17 DIAGNOSIS — E876 Hypokalemia: Secondary | ICD-10-CM | POA: Diagnosis not present

## 2019-12-17 DIAGNOSIS — E7800 Pure hypercholesterolemia, unspecified: Secondary | ICD-10-CM | POA: Diagnosis not present

## 2019-12-17 DIAGNOSIS — I10 Essential (primary) hypertension: Secondary | ICD-10-CM | POA: Diagnosis not present

## 2019-12-17 DIAGNOSIS — E559 Vitamin D deficiency, unspecified: Secondary | ICD-10-CM | POA: Diagnosis not present

## 2019-12-18 ENCOUNTER — Other Ambulatory Visit: Payer: Self-pay | Admitting: Family Medicine

## 2019-12-18 DIAGNOSIS — E0840 Diabetes mellitus due to underlying condition with diabetic neuropathy, unspecified: Secondary | ICD-10-CM

## 2019-12-18 LAB — LIPID PANEL
Cholesterol: 236 mg/dL — ABNORMAL HIGH (ref <200)
HDL: 36 mg/dL — ABNORMAL LOW (ref >=50)
LDL Cholesterol (Calc): 171 mg/dL (calc) — ABNORMAL HIGH
Non-HDL Cholesterol (Calc): 200 mg/dL (calc) — ABNORMAL HIGH (ref <130)
Total CHOL/HDL Ratio: 6.6 (calc) — ABNORMAL HIGH (ref <5.0)
Triglycerides: 148 mg/dL (ref <150)

## 2019-12-18 LAB — COMPLETE METABOLIC PANEL WITH GFR
AG Ratio: 1.4 (calc) (ref 1.0–2.5)
ALT: 11 U/L (ref 6–29)
AST: 7 U/L — ABNORMAL LOW (ref 10–35)
Albumin: 4.2 g/dL (ref 3.6–5.1)
Alkaline phosphatase (APISO): 110 U/L (ref 37–153)
BUN: 7 mg/dL (ref 7–25)
CO2: 29 mmol/L (ref 20–32)
Calcium: 10.1 mg/dL (ref 8.6–10.4)
Chloride: 106 mmol/L (ref 98–110)
Creat: 0.83 mg/dL (ref 0.50–1.05)
GFR, Est African American: 93 mL/min/{1.73_m2} (ref >=60)
GFR, Est Non African American: 80 mL/min/{1.73_m2} (ref >=60)
Globulin: 3 g/dL (calc) (ref 1.9–3.7)
Glucose, Bld: 171 mg/dL — ABNORMAL HIGH (ref 65–99)
Potassium: 4.2 mmol/L (ref 3.5–5.3)
Sodium: 141 mmol/L (ref 135–146)
Total Bilirubin: 0.5 mg/dL (ref 0.2–1.2)
Total Protein: 7.2 g/dL (ref 6.1–8.1)

## 2019-12-18 LAB — VITAMIN D 25 HYDROXY (VIT D DEFICIENCY, FRACTURES): Vit D, 25-Hydroxy: 24 ng/mL — ABNORMAL LOW (ref 30–100)

## 2019-12-18 MED ORDER — ATORVASTATIN CALCIUM 80 MG PO TABS: 80.0000 mg | ORAL_TABLET | Freq: Every day | ORAL | 0 refills | Status: DC

## 2019-12-18 MED ORDER — VITAMIN D (ERGOCALCIFEROL) 1.25 MG (50000 UNIT) PO CAPS: 50000.0000 [IU] | ORAL_CAPSULE | ORAL | 2 refills | Status: DC

## 2019-12-22 ENCOUNTER — Encounter: Payer: Self-pay | Admitting: Emergency Medicine

## 2019-12-27 DIAGNOSIS — Z23 Encounter for immunization: Secondary | ICD-10-CM | POA: Diagnosis not present

## 2019-12-29 NOTE — Progress Notes (Signed)
Sonya James - 54 y.o. female MRN [id number]  Date of birth: February 18, 1966  Office Visit Note: Visit Date: 10/29/2019 PCP: Maryruth Hancock, MD Referred by: Maryruth Hancock, MD  Subjective: Chief Complaint  Patient presents with  . Lower Back - Pain   HPI:  Sonya James is a 54 y.o. female who comes in today For planned right L4-5 and L5-S1 diagnostic facet joint blocks.  This injection will be diagnostic and hopefully could be therapeutic.  We did want MRI of the lumbar spine performed but this was denied by her insurance.  She does have Medicaid.  She has no red flag complaints and I feel like injection would be warranted just to see how she does.  If she does not do well with that that I think regrouping with a short course of physical therapy and medication management and then seeing if we needed to get MRI at that time.  Her history is well reviewed on the last note.  ROS Otherwise per HPI.  Assessment & Plan: Visit Diagnoses:  1. Spondylosis without myelopathy or radiculopathy, lumbar region     Plan: No additional findings.   Meds & Orders:  Meds ordered this encounter  Medications  . methylPREDNISolone acetate (DEPO-MEDROL) injection 40 mg    Orders Placed This Encounter  Procedures  . Facet Injection  . XR C-ARM NO REPORT    Follow-up: Return if symptoms worsen or fail to improve.   Procedures: No procedures performed  Lumbar Facet Joint Intra-Articular Injection(s) with Fluoroscopic Guidance  Patient: Sonya James      Date of Birth: Nov 03, 1965 MRN: [id number] PCP: Maryruth Hancock, MD      Visit Date: 10/29/2019   Universal Protocol:    Date/Time: 10/29/2019  Consent Given By: the patient  Position: PRONE   Additional Comments: Vital signs were monitored before and after the procedure. Patient was prepped and draped in the usual sterile fashion. The correct patient, procedure, and site was verified.   Injection Procedure Details:  Procedure Site One Meds  Administered:  Meds ordered this encounter  Medications  . methylPREDNISolone acetate (DEPO-MEDROL) injection 40 mg     Laterality: Right  Location/Site:  L4-L5 L5-S1  Needle size: 22 guage  Needle type: Spinal  Needle Placement: Articular  Findings:  -Comments: Excellent flow of contrast producing a partial arthrogram.  Procedure Details: The fluoroscope beam is vertically oriented in AP, and the inferior recess is visualized beneath the lower pole of the inferior apophyseal process, which represents the target point for needle insertion. When direct visualization is difficult the target point is located at the medial projection of the vertebral pedicle. The region overlying each aforementioned target is locally anesthetized with a 1 to 2 ml. volume of 1% Lidocaine without Epinephrine.   The spinal needle was inserted into each of the above mentioned facet joints using biplanar fluoroscopic guidance. A 0.25 to 0.5 ml. volume of Isovue-250 was injected and a partial facet joint arthrogram was obtained. A single spot film was obtained of the resulting arthrogram.    One to 1.25 ml of the steroid/anesthetic solution was then injected into each of the facet joints noted above.   Additional Comments:  The patient tolerated the procedure well Dressing: 2 x 2 sterile gauze and Band-Aid    Post-procedure details: Patient was observed during the procedure. Post-procedure instructions were reviewed.  Patient left the clinic in stable condition.     Clinical History: No specialty comments  available.     Objective:  VS:  HT:    WT:   BMI:     BP:(!) 171/86  HR:74bpm  TEMP: ( )  RESP:  Physical Exam  Ortho Exam Imaging: No results found.

## 2019-12-29 NOTE — Procedures (Signed)
Lumbar Facet Joint Intra-Articular Injection(s) with Fluoroscopic Guidance  Patient: Sonya James      Date of Birth: 1966/01/15 MRN: [id number] PCP: Maryruth Hancock, MD      Visit Date: 10/29/2019   Universal Protocol:    Date/Time: 10/29/2019  Consent Given By: the patient  Position: PRONE   Additional Comments: Vital signs were monitored before and after the procedure. Patient was prepped and draped in the usual sterile fashion. The correct patient, procedure, and site was verified.   Injection Procedure Details:  Procedure Site One Meds Administered:  Meds ordered this encounter  Medications  . methylPREDNISolone acetate (DEPO-MEDROL) injection 40 mg     Laterality: Right  Location/Site:  L4-L5 L5-S1  Needle size: 22 guage  Needle type: Spinal  Needle Placement: Articular  Findings:  -Comments: Excellent flow of contrast producing a partial arthrogram.  Procedure Details: The fluoroscope beam is vertically oriented in AP, and the inferior recess is visualized beneath the lower pole of the inferior apophyseal process, which represents the target point for needle insertion. When direct visualization is difficult the target point is located at the medial projection of the vertebral pedicle. The region overlying each aforementioned target is locally anesthetized with a 1 to 2 ml. volume of 1% Lidocaine without Epinephrine.   The spinal needle was inserted into each of the above mentioned facet joints using biplanar fluoroscopic guidance. A 0.25 to 0.5 ml. volume of Isovue-250 was injected and a partial facet joint arthrogram was obtained. A single spot film was obtained of the resulting arthrogram.    One to 1.25 ml of the steroid/anesthetic solution was then injected into each of the facet joints noted above.   Additional Comments:  The patient tolerated the procedure well Dressing: 2 x 2 sterile gauze and Band-Aid    Post-procedure details: Patient was observed  during the procedure. Post-procedure instructions were reviewed.  Patient left the clinic in stable condition.

## 2020-01-01 ENCOUNTER — Ambulatory Visit (INDEPENDENT_AMBULATORY_CARE_PROVIDER_SITE_OTHER): Payer: Medicaid Other | Admitting: Orthopaedic Surgery

## 2020-01-01 ENCOUNTER — Other Ambulatory Visit: Payer: Self-pay

## 2020-01-01 ENCOUNTER — Encounter: Payer: Self-pay | Admitting: Orthopaedic Surgery

## 2020-01-01 DIAGNOSIS — G8929 Other chronic pain: Secondary | ICD-10-CM

## 2020-01-01 DIAGNOSIS — F17210 Nicotine dependence, cigarettes, uncomplicated: Secondary | ICD-10-CM

## 2020-01-01 DIAGNOSIS — M25561 Pain in right knee: Secondary | ICD-10-CM

## 2020-01-01 DIAGNOSIS — M25512 Pain in left shoulder: Secondary | ICD-10-CM | POA: Diagnosis not present

## 2020-01-01 MED ORDER — HYDROCODONE-ACETAMINOPHEN 5-325 MG PO TABS: ORAL_TABLET | ORAL | 0 refills | Status: DC

## 2020-01-01 NOTE — Progress Notes (Signed)
PROCEDURE NOTE:  The patient requests injections of the right knee , verbal consent was obtained.  The right knee was prepped appropriately after time out was performed.   Sterile technique was observed and injection of 1 cc of Depo-Medrol 40 mg with several cc's of plain xylocaine. Anesthesia was provided by ethyl chloride and a 20-gauge needle was used to inject the knee area. The injection was tolerated well.  A band aid dressing was applied.  The patient was advised to apply ice later today and tomorrow to the injection sight as needed.  PROCEDURE NOTE:  The patient request injection, verbal consent was obtained.  The left shoulder was prepped appropriately after time out was performed.   Sterile technique was observed and injection of 1 cc of Depo-Medrol 40 mg with several cc's of plain xylocaine. Anesthesia was provided by ethyl chloride and a 20-gauge needle was used to inject the shoulder area. A posterior approach was used.  The injection was tolerated well.  A band aid dressing was applied.  The patient was advised to apply ice later today and tomorrow to the injection sight as needed.  I have reviewed the [place] web site prior to prescribing narcotic medicine for this patient.   I will see in two months.  Electronically Signed Sanjuana Kava, MD 4/22/20218:18 AM

## 2020-01-13 DIAGNOSIS — B351 Tinea unguium: Secondary | ICD-10-CM | POA: Diagnosis not present

## 2020-01-13 DIAGNOSIS — L600 Ingrowing nail: Secondary | ICD-10-CM | POA: Diagnosis not present

## 2020-01-13 DIAGNOSIS — Z7689 Persons encountering health services in other specified circumstances: Secondary | ICD-10-CM | POA: Diagnosis not present

## 2020-01-19 ENCOUNTER — Telehealth: Payer: Self-pay | Admitting: *Deleted

## 2020-01-19 NOTE — Telephone Encounter (Signed)
Sonya James, you are scheduled for a virtual visit with your provider today.  Just as we do with appointments in the office, we must obtain your consent to participate.  Your consent will be active for this visit and any virtual visit you may have with one of our providers in the next 365 days.  If you have a MyChart account, I can also send a copy of this consent to you electronically.  All virtual visits are billed to your insurance company just like a traditional visit in the office.  As this is a virtual visit, video technology does not allow for your provider to perform a traditional examination.  This may limit your provider's ability to fully assess your condition.  If your provider identifies any concerns that need to be evaluated in person or the need to arrange testing such as labs, EKG, etc, we will make arrangements to do so.  Although advances in technology are sophisticated, we cannot ensure that it will always work on either your end or our end.  If the connection with a video visit is poor, we may have to switch to a telephone visit.  With either a video or telephone visit, we are not always able to ensure that we have a secure connection.   I need to obtain your verbal consent now.   Are you willing to proceed with your visit today?

## 2020-01-19 NOTE — Telephone Encounter (Signed)
Pt consented to a virtual visit. 

## 2020-01-20 ENCOUNTER — Other Ambulatory Visit: Payer: Self-pay

## 2020-01-20 ENCOUNTER — Encounter: Payer: Self-pay | Admitting: "Endocrinology

## 2020-01-20 ENCOUNTER — Ambulatory Visit (INDEPENDENT_AMBULATORY_CARE_PROVIDER_SITE_OTHER): Payer: Medicaid Other | Admitting: "Endocrinology

## 2020-01-20 VITALS — BP 168/90 | HR 74 | Ht 65.0 in | Wt 236.0 lb

## 2020-01-20 DIAGNOSIS — E1169 Type 2 diabetes mellitus with other specified complication: Secondary | ICD-10-CM

## 2020-01-20 DIAGNOSIS — E782 Mixed hyperlipidemia: Secondary | ICD-10-CM

## 2020-01-20 DIAGNOSIS — Z794 Long term (current) use of insulin: Secondary | ICD-10-CM | POA: Diagnosis not present

## 2020-01-20 DIAGNOSIS — I10 Essential (primary) hypertension: Secondary | ICD-10-CM

## 2020-01-20 DIAGNOSIS — Z7689 Persons encountering health services in other specified circumstances: Secondary | ICD-10-CM | POA: Diagnosis not present

## 2020-01-20 LAB — POCT GLYCOSYLATED HEMOGLOBIN (HGB A1C): Hemoglobin A1C: 8.9 % — AB (ref 4.0–5.6)

## 2020-01-20 MED ORDER — GLIPIZIDE ER 5 MG PO TB24: 5.0000 mg | ORAL_TABLET | Freq: Every day | ORAL | 3 refills | Status: DC

## 2020-01-20 MED ORDER — NOVOLIN 70/30 RELION (70-30) 100 UNIT/ML ~~LOC~~ SUSP: 60.0000 [IU] | Freq: Two times a day (BID) | SUBCUTANEOUS | 2 refills | Status: DC

## 2020-01-20 NOTE — Patient Instructions (Signed)

## 2020-01-20 NOTE — Progress Notes (Signed)
Endocrinology Consult Note       01/20/2020, 9:48 AM   Subjective:    Patient ID: Sonya James, female    DOB: October 25, 1969.  Sonya James is being seen in consultation for management of currently uncontrolled symptomatic diabetes requested by  Sonya Hancock, MD.   Past Medical History:  Diagnosis Date  . Anemia   . Anxiety   . Arthritis   . Asthma   . Autonomic neuropathy   . Constipation   . COPD (chronic obstructive pulmonary disease) ([place])   . CTS (carpal tunnel syndrome)   . Depression   . Diabetes mellitus without complication ([place])   . Endometrial cancer ([place]) 2017  . GERD (gastroesophageal reflux disease)   . Headache(784.0)   . HTN (hypertension)   . Hypercholesterolemia   . Hypothyroidism   . IBS (irritable bowel syndrome)   . Osteoarthritis    osteoarthritis  . Pancreatitis    per patient   . Recurrent boils    buttocks and low back.  . Shortness of breath   . Sleep apnea    CPAP machine  . Uterine cancer ([place]) 2017  . Vitamin D deficiency     Past Surgical History:  Procedure Laterality Date  . ABDOMINAL HYSTERECTOMY  2018   endometrial cancer, surgery done at [facility]   . BIOPSY  01/02/2017   Procedure: BIOPSY;  Surgeon: Danie Binder, MD;  Location: [facility];  Service: Endoscopy;;  gastric biopsy  . CESAREAN SECTION  1989  . COLONOSCOPY WITH PROPOFOL N/A 01/02/2017   Dr. Oneida Alar: redundant colon, two 2-3 mm polyps in sigmoid colon (hyperplastic)  . ESOPHAGOGASTRODUODENOSCOPY  05/22/2011   Dr. Oneida Alar: H.pylori gastritis   . ESOPHAGOGASTRODUODENOSCOPY (EGD) WITH PROPOFOL N/A 01/02/2017   Dr. 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 FOOT SURGERY Left    tendon repair  . OOPHORECTOMY    . POLYPECTOMY  01/02/2017   Procedure: POLYPECTOMY;  Surgeon: Danie Binder, MD;  Location: [facility];  Service:  Endoscopy;;  sigmoid colon polyps times 2  . REDUCTION MAMMAPLASTY  1996  . SAVORY DILATION N/A 01/02/2017   Procedure: SAVORY DILATION;  Surgeon: Danie Binder, MD;  Location: [facility];  Service: Endoscopy;  Laterality: N/A;  . TUBAL LIGATION      Social History   Socioeconomic History  . Marital status: Married    Spouse name: Not on file  . Number of children: 1  . Years of education: Not on file  . Highest education level: Not on file  Occupational History  . Not on file  Tobacco Use  . Smoking status: Current Some Day Smoker    Packs/day: 0.50    Years: 22.00    Pack years: 11.00    Types: Cigarettes  . Smokeless tobacco: Never Used  Substance and Sexual Activity  . Alcohol use: No  . Drug use: No  . Sexual activity: Yes    Birth control/protection: Surgical    Comment: hyst  Other Topics Concern  . Not on file  Social History Narrative  Drinks coffee occasionally, soda 2-3 daily    Social Determinants of Health   Financial Resource Strain:   . Difficulty of Paying Living Expenses:   Food Insecurity:   . Worried About Charity fundraiser in the Last Year:   . Arboriculturist in the Last Year:   Transportation Needs:   . Film/video editor (Medical):   [name] Lack of Transportation (Non-Medical):   Physical Activity:   . Days of Exercise per Week:   . Minutes of Exercise per Session:   Stress:   . Feeling of Stress :   Social Connections:   . Frequency of Communication with Friends and Family:   . Frequency of Social Gatherings with Friends and Family:   . Attends Religious Services:   . Active Member of Clubs or Organizations:   . Attends Archivist Meetings:   [name] Marital Status:     Family History  Problem Relation Age of Onset  . Diabetes Mother   . Hypertension Mother   . COPD Mother   . Asthma Mother   . Hypercholesterolemia Mother   . Hypertension Sister   . Hyperlipidemia Sister   . Diabetes Sister   . Asthma Brother   .  Alcohol abuse Brother   . Asthma Son   . Cancer Maternal Grandmother        lung, throat, breast  . Alzheimer's disease Maternal Grandmother   . Hypertension Maternal Grandmother   . Hypercholesterolemia Maternal Grandmother   . Heart disease Maternal Grandfather   . Stroke Maternal Grandfather   . Clotting disorder Maternal Grandfather        blood clots in legs  . Hypertension Sister   . Hyperlipidemia Sister   . Stroke Maternal Aunt   . Clotting disorder Maternal Aunt        blood clots in legs  . Hypertension Maternal Aunt   . Hypercholesterolemia Maternal Aunt   . Hypertension Maternal Aunt   . Asthma Maternal Aunt   . Clotting disorder Maternal Uncle        blood clot -legs traveled to heart and he died of heart attack  . Colon cancer Neg Hx     Outpatient Encounter Medications as of 01/20/2020  Medication Sig  . albuterol (PROVENTIL) (2.5 MG/3ML) 0.083% nebulizer solution Take 3 mLs (2.5 mg total) by nebulization every 6 (six) hours as needed for wheezing.  [name] albuterol (VENTOLIN HFA) 108 (90 Base) MCG/ACT inhaler Inhale 2 puffs into the lungs every 6 (six) hours as needed for wheezing or shortness of breath.  [name] apixaban (ELIQUIS) 5 MG TABS tablet Take 1 tablet (5 mg total) by mouth 2 (two) times daily. On 08/12/17 @ 9PM, start 1 tab (5 mg) two times daily.  [name] atorvastatin (LIPITOR) 80 MG tablet Take 1 tablet (80 mg total) by mouth daily.  . chlorhexidine (HIBICLENS) 4 % external liquid Apply topically daily as needed. Wash area of abscess  . cycloSPORINE (RESTASIS) 0.05 % ophthalmic emulsion Place 1 drop into both eyes daily.  [name] doxycycline (VIBRAMYCIN) 100 MG capsule Take 1 capsule (100 mg total) by mouth 2 (two) times daily.  [name] esomeprazole (NEXIUM) 40 MG capsule Take 1 capsule (40 mg total) by mouth 2 (two) times daily before a meal.  . Ferrous Gluconate-C-Folic Acid (IRON-C PO) Take by mouth.  . furosemide (LASIX) 40 MG tablet Take 1 tablet (40 mg total) by mouth daily.   [name] gabapentin (NEURONTIN) 300 MG capsule Take 300 mg  by mouth 3 (three) times daily.  [name] glipiZIDE (GLUCOTROL XL) 5 MG 24 hr tablet Take 1 tablet (5 mg total) by mouth daily with breakfast.  . HYDROcodone-acetaminophen (NORCO/VICODIN) 5-325 MG tablet One tablet every six hours for pain.  Limit 7 days.  . hydrocortisone 1 % ointment Apply 1 application topically 2 (two) times daily.  . insulin NPH-regular Human (NOVOLIN 70/30 RELION) (70-30) 100 UNIT/ML injection Inject 60 Units into the skin 2 (two) times daily with a meal. Inject 50 units into skin three times daily (breakfast, supper, bedtime)  . JARDIANCE 25 MG TABS tablet Take 25 mg by mouth daily.  [name] linaclotide (LINZESS) 290 MCG CAPS capsule Take 1 capsule (290 mcg total) by mouth daily before breakfast.  . lisinopril (ZESTRIL) 40 MG tablet Take 1 tablet (40 mg total) by mouth daily.  . metFORMIN (GLUCOPHAGE) 1000 MG tablet Take 1 tablet (1,000 mg total) by mouth 2 (two) times daily with a meal.  . methocarbamol (ROBAXIN) 500 MG tablet Take 1 tablet (500 mg total) by mouth 2 (two) times daily.  . montelukast (SINGULAIR) 10 MG tablet Take 1 tablet (10 mg total) by mouth at bedtime.  . mupirocin ointment (BACTROBAN) 2 % Place 1 application into the nose daily.  . naproxen (NAPROSYN) 500 MG tablet Take 500 mg by mouth as needed.   . ondansetron (ZOFRAN ODT) 4 MG disintegrating tablet Take 4 mg by mouth as needed for nausea or vomiting.   . potassium chloride (KLOR-CON) 10 MEQ tablet Take 1 tablet (10 mEq total) by mouth every evening.  . silver sulfADIAZINE (SILVADENE) 1 % cream Apply 1 application topically daily. (Patient taking differently: Apply 1 application topically as needed. )  . varenicline (CHANTIX CONTINUING MONTH PAK) 1 MG tablet Take 1 tablet (1 mg total) by mouth 2 (two) times daily.  . Vitamin D, Ergocalciferol, (DRISDOL) 1.25 MG (50000 UNIT) CAPS capsule Take 1 capsule (50,000 Units total) by mouth every 7 (seven) days.  .  [DISCONTINUED] insulin NPH-regular Human (NOVOLIN 70/30 RELION) (70-30) 100 UNIT/ML injection Inject 50 units into skin three times daily (breakfast, supper, bedtime)   No facility-administered encounter medications on file as of 01/20/2020.    ALLERGIES: No Known Allergies  VACCINATION STATUS: Immunization History  Administered Date(s) Administered  . Influenza,inj,Quad PF,6+ Mos 07/04/2014, 05/22/2019  . Moderna SARS-COVID-2 Vaccination 12/04/2019  . Pneumococcal Polysaccharide-23 07/04/2014    Diabetes She presents for her initial diabetic visit. She has type 2 diabetes mellitus. Onset time: She was diagnosed at approximate age of 72 years. There are no hypoglycemic associated symptoms. Pertinent negatives for hypoglycemia include no confusion, headaches, pallor or seizures. Associated symptoms include fatigue, polydipsia and polyuria. Pertinent negatives for diabetes include no chest pain and no polyphagia. There are no hypoglycemic complications. Symptoms are worsening. Diabetic complications include peripheral neuropathy and PVD. Risk factors for coronary artery disease include diabetes mellitus, dyslipidemia, family history, obesity, hypertension, tobacco exposure, sedentary lifestyle and post-menopausal. Current diabetic treatment includes insulin injections and oral agent (monotherapy) (She is currently on Novolin 70/30 100 units 3 times a day, Jardiance 25 mg daily, Metformin 1000 mg p.o. twice daily.). Her weight is decreasing steadily. She is following a generally unhealthy diet. When asked about meal planning, she reported none. She has not had a previous visit with a dietitian. She never participates in exercise. (She did not bring any logs nor meter to review with her today.  She reports her readings are always high when she checks them.  Her  point-of-care A1c is 8.9%, improving from 9.7%.) An ACE inhibitor/angiotensin II receptor blocker is being taken.  Hyperlipidemia This is a  chronic problem. The current episode started more than 1 year ago. The problem is uncontrolled. Exacerbating diseases include diabetes and obesity. Pertinent negatives include no chest pain, myalgias or shortness of breath. Current antihyperlipidemic treatment includes statins. Risk factors for coronary artery disease include diabetes mellitus, dyslipidemia, hypertension, family history, obesity, a sedentary lifestyle and post-menopausal.  Hypertension This is a chronic problem. The current episode started more than 1 year ago. The problem is uncontrolled. Pertinent negatives include no chest pain, headaches, palpitations or shortness of breath. Risk factors for coronary artery disease include diabetes mellitus, dyslipidemia, family history, obesity, post-menopausal state, sedentary lifestyle and smoking/tobacco exposure. Hypertensive end-organ damage includes PVD.     Review of Systems  Constitutional: Positive for fatigue. Negative for chills, fever and unexpected weight change.  HENT: Negative for trouble swallowing and voice change.   Eyes: Negative for visual disturbance.  Respiratory: Negative for cough, shortness of breath and wheezing.   Cardiovascular: Negative for chest pain, palpitations and leg swelling.  Gastrointestinal: Negative for diarrhea, nausea and vomiting.  Endocrine: Positive for polydipsia and polyuria. Negative for cold intolerance, heat intolerance and polyphagia.  Musculoskeletal: Negative for arthralgias and myalgias.  Skin: Negative for color change, pallor, rash and wound.  Neurological: Negative for seizures and headaches.  Psychiatric/Behavioral: Negative for confusion and suicidal ideas.    Objective:    Vitals with BMI 01/20/2020 12/09/2019 10/29/2019  Height 5\' 5"  5\' 5"  -  Weight 236 lbs 248 lbs 10 oz -  BMI [id number] -  Systolic [id number]  Diastolic 90 98 86  Pulse 74 64 74    BP (!) 168/90   Pulse 74   Ht 5\' 5"  (1.651 m)   Wt 236 lb (107 kg)    LMP 09/20/2015 (Exact Date)   BMI 39.27 kg/m   Wt Readings from Last 3 Encounters:  01/20/20 236 lb (107 kg)  12/09/19 248 lb 9.6 oz (112.8 kg)  09/24/19 230 lb (104.3 kg)     Physical Exam Constitutional:      Appearance: She is well-developed.  HENT:     Head: Normocephalic and atraumatic.  Neck:     Thyroid: No thyromegaly.     Trachea: No tracheal deviation.  Cardiovascular:     Rate and Rhythm: Normal rate and regular rhythm.     Comments: Diminished pulses on dorsalis pedis and posterior tibial arteries. Pulmonary:     Effort: Pulmonary effort is normal.  Abdominal:     Tenderness: There is no abdominal tenderness. There is no guarding.  Musculoskeletal:        General: Normal range of motion.     Cervical back: Normal range of motion and neck supple.  Skin:    General: Skin is warm and dry.     Coloration: Skin is not pale.     Findings: No erythema or rash.     Comments: Diffuse tattoos.  Neurological:     Mental Status: She is alert and oriented to person, place, and time.     Cranial Nerves: No cranial nerve deficit.     Coordination: Coordination normal.     Deep Tendon Reflexes: Reflexes are normal and symmetric.     Comments: Diminished monofilament testing on bilateral lower extremities.  Psychiatric:        Judgment: Judgment normal.      CMP ( most recent)  CMP     Component Value Date/Time   NA 141 12/17/2019 0946   K 4.2 12/17/2019 0946   CL 106 12/17/2019 0946   CO2 29 12/17/2019 0946   GLUCOSE 171 (H) 12/17/2019 0946   BUN 7 12/17/2019 0946   CREATININE 0.83 12/17/2019 0946   CALCIUM 10.1 12/17/2019 0946   PROT 7.2 12/17/2019 0946   ALBUMIN 4.0 08/27/2019 1558   AST 7 (L) 12/17/2019 0946   ALT 11 12/17/2019 0946   ALKPHOS 92 08/27/2019 1558   BILITOT 0.5 12/17/2019 0946   GFRNONAA 80 12/17/2019 0946   GFRAA 93 12/17/2019 0946     Diabetic Labs (most recent): Lab Results  Component Value Date   HGBA1C 8.9 (A) 01/20/2020   HGBA1C  9.7 (H) 08/27/2019   HGBA1C 11.4 (A) 05/08/2019     Lipid Panel ( most recent) Lipid Panel     Component Value Date/Time   CHOL 236 (H) 12/17/2019 0946   TRIG 148 12/17/2019 0946   HDL 36 (L) 12/17/2019 0946   CHOLHDL 6.6 (H) 12/17/2019 0946   VLDL 48 (H) 08/27/2019 1559   LDLCALC 171 (H) 12/17/2019 0946      Lab Results  Component Value Date   TSH 2.507 08/27/2019   TSH 0.641 05/26/2013   FREET4 0.99 05/26/2013       Assessment & Plan:   1. Type 2 diabetes mellitus with other specified complication, with long-term current use of insulin ([place])   - Sonya James has currently uncontrolled symptomatic type 2 DM since  54 years of age. She did not bring any logs nor meter to review with her today.  She reports her readings are always high when she checks them.  Her point-of-care A1c is 8.9%, improving from 9.7%.  Her recent labs were discussed and reviewed with her. - I had a long discussion with her about the progressive nature of diabetes and the pathology behind its complications. -her diabetes is complicated by peripheral neuropathy, peripheral arterial disease, chronic heavy smoking, obesity/sedentary life and she remains at a high risk for more acute and chronic complications which include CAD, CVA, CKD, retinopathy, and neuropathy. These are all discussed in detail with her.  - I have counseled her on diet  and weight management  by adopting a carbohydrate restricted/protein rich diet. Patient is encouraged to switch to  unprocessed or minimally processed     complex starch and increased protein intake (animal or plant source), fruits, and vegetables. -  she is advised to stick to a routine mealtimes to eat 3 meals  a day and avoid unnecessary snacks ( to snack only to correct hypoglycemia).   - she admits that there is a room for improvement in her food and drink choices. - Suggestion is made for her to avoid simple carbohydrates  from her diet including Cakes, Sweet  Desserts, Ice Cream, Soda (diet and regular), Sweet Tea, Candies, Chips, Cookies, Store Bought Juices, Alcohol in Excess of  1-2 drinks a day, Artificial Sweeteners,  Coffee Creamer, and "Sugar-free" Products. This will help patient to have more stable blood glucose profile and potentially avoid unintended weight gain.  - she will be scheduled with Jearld Fenton, RDN, CDE for diabetes education.  - I have approached her with the following individualized plan to manage  her diabetes and patient agrees:   - she will benefit from staying on her premixed insulin for simplicity reasons.  -She is approached to start strict monitoring of blood glucose 4  times a day-daily before meals and at bedtime.  -Since she has not been monitoring blood glucose, it is unsafe to leave her on her current dose of insulin.    She is advised to lower her Novolin 70/30 to 60 units with breakfast and 60 units with supper only when her Premeal readings are greater than 90 mg per DL. - she is warned not to take insulin without proper monitoring per orders.  - she is encouraged to call clinic for blood glucose levels less than 70 or above 300 mg /dl. - she is advised to continue Metformin 1000 mg p.o. twice daily, therapeutically suitable for patient . - her Vania Rea will be discontinued, risk outweighs benefit for this patient. -She is too high risk to consider incretin therapy due to her severe dyslipidemia and heavy chronic smoking-high risk for pancreatitis.  - Specific targets for  A1c;  LDL, HDL,  and Triglycerides were discussed with the patient.  2) Blood Pressure /Hypertension:  her blood pressure is not controlled to target.   she is advised to continue her current medications including lisinopril 40 mg p.o. daily with breakfast . 3) Lipids/Hyperlipidemia:   Review of her recent lipid panel showed uncontrolled  LDL at 171 .  Reportedly, her Lipitor was recently increased to 80 mg.  She is advised to continue  atorvastatin 80 mg p.o. nightly. Side effects and precautions discussed with her.  4)  Weight/Diet:  Body mass index is 39.27 kg/m.  -   clearly complicating her diabetes care.   she is  a candidate for weight loss. I discussed with her the fact that loss of 5 - 10% of her  current body weight will have the most impact on her diabetes management.  Exercise, and detailed carbohydrates information provided  -  detailed on discharge instructions.  5) Chronic Care/Health Maintenance:  -she  is on ACEI/ARB and Statin medications and  is encouraged to initiate and continue to follow up with Ophthalmology, Dentist,  Podiatrist at least yearly or according to recommendations, and advised to  quit smoking. I have recommended yearly flu vaccine and pneumonia vaccine at least every 5 years; moderate intensity exercise for up to 150 minutes weekly; and  sleep for at least 7 hours a day.  Her foot exam today is significant for bilateral peripheral neuropathy, bilateral peripheral arterial disease.  The patient was counseled on the dangers of tobacco use, and was advised to quit.  Reviewed strategies to maximize success, including removing cigarettes and smoking materials from environment.   - she is  advised to maintain close follow up with [place], Rex Kras, MD for primary care needs, as well as her other providers for optimal and coordinated care.   - Time spent in this patient care: 60 min, of which > 50% was spent in  counseling  her about her currently uncontrolled, complicated type 2 diabetes; hyperlipidemia; hypertension and the rest reviewing her blood glucose logs , discussing her hypoglycemia and hyperglycemia episodes, reviewing her current and  previous labs / studies  ( including abstraction from other facilities) and medications  doses and developing a  long term treatment plan based on the latest standards of care/ guidelines; and documenting her care.    Please refer to Patient Instructions for  Blood Glucose Monitoring and Insulin/Medications Dosing Guide"  in media tab for additional information. Please  also refer to " Patient Self Inventory" in the Media  tab for reviewed elements of pertinent patient history.  Otila Kluver  A Silveria participated in the discussions, expressed understanding, and voiced agreement with the above plans.  All questions were answered to her satisfaction. she is encouraged to contact clinic should she have any questions or concerns prior to her return visit.   Follow up plan: - Return in about 2 weeks (around 02/03/2020) for F/U with Meter and Logs Only - no Labs.  Glade Lloyd, MD [facility] [address] [place], [place] [address] Phone: [phone]  Fax: [phone]    01/20/2020, 9:48 AM  This note was partially dictated with voice recognition software. Similar sounding words can be transcribed inadequately or may not  be corrected upon review.

## 2020-01-21 ENCOUNTER — Encounter: Payer: Self-pay | Admitting: Gastroenterology

## 2020-01-21 ENCOUNTER — Telehealth (INDEPENDENT_AMBULATORY_CARE_PROVIDER_SITE_OTHER): Payer: Medicaid Other | Admitting: Gastroenterology

## 2020-01-21 DIAGNOSIS — K219 Gastro-esophageal reflux disease without esophagitis: Secondary | ICD-10-CM

## 2020-01-21 DIAGNOSIS — K5900 Constipation, unspecified: Secondary | ICD-10-CM | POA: Diagnosis not present

## 2020-01-21 MED ORDER — LINACLOTIDE 290 MCG PO CAPS: 290.0000 ug | ORAL_CAPSULE | Freq: Every day | ORAL | 3 refills | Status: DC

## 2020-01-21 MED ORDER — HYDROCORTISONE (PERIANAL) 2.5 % EX CREA: 1.0000 "application " | TOPICAL_CREAM | Freq: Two times a day (BID) | CUTANEOUS | 1 refills | Status: DC

## 2020-01-21 NOTE — Progress Notes (Signed)
CC'ED TO PCP 

## 2020-01-21 NOTE — Progress Notes (Signed)
Primary Care Physician:  Maryruth Hancock, MD  Primary GI: Dr. Oneida Alar   Patient Location: Home   Provider Location: [facility] office   Reason for Visit: Follow-up    Persons present on the virtual encounter, with roles: Patient and NP   Total time (minutes) spent on medical discussion: 10 minutes   Due to COVID-19, visit was conducted using virtual method.  Visit was requested by patient.  Virtual Visit via Telephone Note Due to COVID-19, visit is conducted virtually and was requested by patient.   I connected with Sonya James on 01/21/20 at  2:30 PM EDT by telephone and verified that I am speaking with the correct person using two identifiers.   I discussed the limitations, risks, security and privacy concerns of performing an evaluation and management service by telephone and the availability of in person appointments. I also discussed with the patient that there may be a patient responsible charge related to this service. The patient expressed understanding and agreed to proceed.  Chief Complaint  Patient presents with  . Constipation    blood in stools from straining, needs Linzess refill, boil on buttocks, watery filled sacs bust and get irritated near her vagina and on folds/creases of skin     History of Present Illness: Sonya James is a 54 year old female presenting with history of constipation, dysphagia, and GERD. Remote history of pancreatitis in 2013 (CT with possible findings, lipase mildly elevated 111). CT with contrast May 2019 without any abnormalities. Did note mild hepatic steatosis. Last colonoscopy in 2018 with hyperplastic polyps.   Constipation: Linzess has worked well for her. BM every morning. Hasn't had for awhile. Scant amount of blood if straining. Some itching at times. Discomfort with straining. States she gets boils in creases of legs, thighs. This is a chronic problem.   GERD: Nexium BID. No dysphagia.    Past Medical History:  Diagnosis Date  .  Anemia   . Anxiety   . Arthritis   . Asthma   . Autonomic neuropathy   . Constipation   . COPD (chronic obstructive pulmonary disease) ([place])   . CTS (carpal tunnel syndrome)   . Depression   . Diabetes mellitus without complication ([place])   . Endometrial cancer ([place]) 2017  . GERD (gastroesophageal reflux disease)   . Headache(784.0)   . HTN (hypertension)   . Hypercholesterolemia   . Hypothyroidism   . IBS (irritable bowel syndrome)   . Osteoarthritis    osteoarthritis  . Pancreatitis    per patient   . Recurrent boils    buttocks and low back.  . Shortness of breath   . Sleep apnea    CPAP machine  . Uterine cancer ([place]) 2017  . Vitamin D deficiency      Past Surgical History:  Procedure Laterality Date  . ABDOMINAL HYSTERECTOMY  2018   endometrial cancer, surgery done at [facility]   . BIOPSY  01/02/2017   Procedure: BIOPSY;  Surgeon: Danie Binder, MD;  Location: [facility];  Service: Endoscopy;;  gastric biopsy  . CESAREAN SECTION  1989  . COLONOSCOPY WITH PROPOFOL N/A 01/02/2017   Dr. Oneida Alar: redundant colon, two 2-3 mm polyps in sigmoid colon (hyperplastic)  . ESOPHAGOGASTRODUODENOSCOPY  05/22/2011   Dr. Oneida Alar: H.pylori gastritis   . ESOPHAGOGASTRODUODENOSCOPY (EGD) WITH PROPOFOL N/A 01/02/2017   Dr. 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 FOOT SURGERY Left    tendon repair  .  OOPHORECTOMY    . POLYPECTOMY  01/02/2017   Procedure: POLYPECTOMY;  Surgeon: Danie Binder, MD;  Location: [facility];  Service: Endoscopy;;  sigmoid colon polyps times 2  . REDUCTION MAMMAPLASTY  1996  . SAVORY DILATION N/A 01/02/2017   Procedure: SAVORY DILATION;  Surgeon: Danie Binder, MD;  Location: [facility];  Service: Endoscopy;  Laterality: N/A;  . TUBAL LIGATION       Current Meds  Medication Sig  . albuterol (PROVENTIL) (2.5 MG/3ML) 0.083% nebulizer solution Take 3 mLs (2.5 mg total) by nebulization  every 6 (six) hours as needed for wheezing.  [name] albuterol (VENTOLIN HFA) 108 (90 Base) MCG/ACT inhaler Inhale 2 puffs into the lungs every 6 (six) hours as needed for wheezing or shortness of breath.  [name] apixaban (ELIQUIS) 5 MG TABS tablet Take 1 tablet (5 mg total) by mouth 2 (two) times daily. On 08/12/17 @ 9PM, start 1 tab (5 mg) two times daily.  [name] atorvastatin (LIPITOR) 80 MG tablet Take 1 tablet (80 mg total) by mouth daily.  [name] doxycycline (VIBRAMYCIN) 100 MG capsule Take 1 capsule (100 mg total) by mouth 2 (two) times daily.  [name] esomeprazole (NEXIUM) 40 MG capsule Take 1 capsule (40 mg total) by mouth 2 (two) times daily before a meal.  . Ferrous Gluconate-C-Folic Acid (IRON-C PO) Take by mouth daily.   . furosemide (LASIX) 40 MG tablet Take 1 tablet (40 mg total) by mouth daily.  [name] glipiZIDE (GLUCOTROL XL) 5 MG 24 hr tablet Take 1 tablet (5 mg total) by mouth daily with breakfast.  . HYDROcodone-acetaminophen (NORCO/VICODIN) 5-325 MG tablet One tablet every six hours for pain.  Limit 7 days.  . insulin NPH-regular Human (NOVOLIN 70/30 RELION) (70-30) 100 UNIT/ML injection Inject 60 Units into the skin 2 (two) times daily with a meal. Inject 50 units into skin three times daily (breakfast, supper, bedtime)  . lisinopril (ZESTRIL) 40 MG tablet Take 1 tablet (40 mg total) by mouth daily.  . metFORMIN (GLUCOPHAGE) 1000 MG tablet Take 1 tablet (1,000 mg total) by mouth 2 (two) times daily with a meal.  . methocarbamol (ROBAXIN) 500 MG tablet Take 1 tablet (500 mg total) by mouth 2 (two) times daily.  . montelukast (SINGULAIR) 10 MG tablet Take 1 tablet (10 mg total) by mouth at bedtime.  . naproxen (NAPROSYN) 500 MG tablet Take 500 mg by mouth as needed.   . ondansetron (ZOFRAN ODT) 4 MG disintegrating tablet Take 4 mg by mouth as needed for nausea or vomiting.   . potassium chloride (KLOR-CON) 10 MEQ tablet Take 1 tablet (10 mEq total) by mouth every evening.  . silver sulfADIAZINE (SILVADENE) 1 %  cream Apply 1 application topically daily. (Patient taking differently: Apply 1 application topically as needed. )  . varenicline (CHANTIX CONTINUING MONTH PAK) 1 MG tablet Take 1 tablet (1 mg total) by mouth 2 (two) times daily.  . Vitamin D, Ergocalciferol, (DRISDOL) 1.25 MG (50000 UNIT) CAPS capsule Take 1 capsule (50,000 Units total) by mouth every 7 (seven) days.     Family History  Problem Relation Age of Onset  . Diabetes Mother   . Hypertension Mother   . COPD Mother   . Asthma Mother   . Hypercholesterolemia Mother   . Hypertension Sister   . Hyperlipidemia Sister   . Diabetes Sister   . Asthma Brother   . Alcohol abuse Brother   . Asthma Son   . Cancer Maternal Grandmother  lung, throat, breast  . Alzheimer's disease Maternal Grandmother   . Hypertension Maternal Grandmother   . Hypercholesterolemia Maternal Grandmother   . Heart disease Maternal Grandfather   . Stroke Maternal Grandfather   . Clotting disorder Maternal Grandfather        blood clots in legs  . Hypertension Sister   . Hyperlipidemia Sister   . Stroke Maternal Aunt   . Clotting disorder Maternal Aunt        blood clots in legs  . Hypertension Maternal Aunt   . Hypercholesterolemia Maternal Aunt   . Hypertension Maternal Aunt   . Asthma Maternal Aunt   . Clotting disorder Maternal Uncle        blood clot -legs traveled to heart and he died of heart attack  . Colon cancer Neg Hx     Social History   Socioeconomic History  . Marital status: Married    Spouse name: Not on file  . Number of children: 1  . Years of education: Not on file  . Highest education level: Not on file  Occupational History  . Not on file  Tobacco Use  . Smoking status: Current Some Day Smoker    Packs/day: 0.50    Years: 22.00    Pack years: 11.00    Types: Cigarettes  . Smokeless tobacco: Never Used  Substance and Sexual Activity  . Alcohol use: No  . Drug use: No  . Sexual activity: Yes    Birth  control/protection: Surgical    Comment: hyst  Other Topics Concern  . Not on file  Social History Narrative   Drinks coffee occasionally, soda 2-3 daily    Social Determinants of Health   Financial Resource Strain:   . Difficulty of Paying Living Expenses:   Food Insecurity:   . Worried About Charity fundraiser in the Last Year:   . Arboriculturist in the Last Year:   Transportation Needs:   . Film/video editor (Medical):   [name] Lack of Transportation (Non-Medical):   Physical Activity:   . Days of Exercise per Week:   . Minutes of Exercise per Session:   Stress:   . Feeling of Stress :   Social Connections:   . Frequency of Communication with Friends and Family:   . Frequency of Social Gatherings with Friends and Family:   . Attends Religious Services:   . Active Member of Clubs or Organizations:   . Attends Archivist Meetings:   [name] Marital Status:        Review of Systems: Gen: Denies fever, chills, anorexia. Denies fatigue, weakness, weight loss.  CV: Denies chest pain, palpitations, syncope, peripheral edema, and claudication. Resp: Denies dyspnea at rest, cough, wheezing, coughing up blood, and pleurisy. GI: see HPI Derm: Denies rash, itching, dry skin Psych: Denies depression, anxiety, memory loss, confusion. No homicidal or suicidal ideation.  Heme: Denies bruising, bleeding, and enlarged lymph nodes.  Observations/Objective: No distress. Unable to perform physical exam due to telephone encounter. No video available.   Assessment and Plan: 54 year old female with history of GERD, constipation, dysphagia, presenting in follow-up via telephone.  GERD: doing well with Nexium BID. No dysphagia.  Constipation: ran out of Linzess, resulting in return to constipation. She does well when taking this daily. I have refilled this for her. Scant amount of blood with straining and symptomatically likely hemorrhoids. Avoid straining, sent in Anusol cream BID,  and she is to call if no improvement. Colonoscopy  fairly recently on file.   Return in 4-6 months or sooner if needed.  Follow Up Instructions: See AVS   I discussed the assessment and treatment plan with the patient. The patient was provided an opportunity to ask questions and all were answered. The patient agreed with the plan and demonstrated an understanding of the instructions.   The patient was advised to call back or seek an in-person evaluation if the symptoms worsen or if the condition fails to improve as anticipated.  I provided 10 minutes of non-face-to-face time during this encounter.  Annitta Needs, PhD, ANP-BC [facility]

## 2020-01-21 NOTE — Patient Instructions (Addendum)
I have refilled Linzess for you.   You may use the Anusol cream per rectum for any discomfort, itching. Please call if persistent issues, and we will need to see you sooner.   We will see you in 4-6 months!  I enjoyed talking with you today! As you know, I value our relationship and want to provide genuine, compassionate, and quality care. I welcome your feedback. If you receive a survey regarding your visit,  I greatly appreciate you taking time to fill this out. See you next time!  Annitta Needs, PhD, ANP-BC [facility]

## 2020-01-28 DIAGNOSIS — F411 Generalized anxiety disorder: Secondary | ICD-10-CM | POA: Diagnosis not present

## 2020-01-28 DIAGNOSIS — Z5181 Encounter for therapeutic drug level monitoring: Secondary | ICD-10-CM | POA: Diagnosis not present

## 2020-01-28 DIAGNOSIS — Z7689 Persons encountering health services in other specified circumstances: Secondary | ICD-10-CM | POA: Diagnosis not present

## 2020-01-29 ENCOUNTER — Emergency Department (HOSPITAL_COMMUNITY)
Admission: EM | Admit: 2020-01-29 | Discharge: 2020-01-29 | Disposition: A | Discharge status: Home / Self Care | Payer: Medicaid Other | Attending: Emergency Medicine | Admitting: Emergency Medicine

## 2020-01-29 ENCOUNTER — Other Ambulatory Visit: Payer: Self-pay

## 2020-01-29 ENCOUNTER — Encounter (HOSPITAL_COMMUNITY): Payer: Self-pay | Admitting: *Deleted

## 2020-01-29 DIAGNOSIS — W57XXXA Bitten or stung by nonvenomous insect and other nonvenomous arthropods, initial encounter: Secondary | ICD-10-CM | POA: Diagnosis not present

## 2020-01-29 DIAGNOSIS — J449 Chronic obstructive pulmonary disease, unspecified: Secondary | ICD-10-CM | POA: Insufficient documentation

## 2020-01-29 DIAGNOSIS — S80862A Insect bite (nonvenomous), left lower leg, initial encounter: Secondary | ICD-10-CM | POA: Diagnosis not present

## 2020-01-29 DIAGNOSIS — F17210 Nicotine dependence, cigarettes, uncomplicated: Secondary | ICD-10-CM | POA: Insufficient documentation

## 2020-01-29 DIAGNOSIS — L03116 Cellulitis of left lower limb: Secondary | ICD-10-CM | POA: Diagnosis not present

## 2020-01-29 DIAGNOSIS — Z79899 Other long term (current) drug therapy: Secondary | ICD-10-CM | POA: Diagnosis not present

## 2020-01-29 DIAGNOSIS — Y939 Activity, unspecified: Secondary | ICD-10-CM | POA: Insufficient documentation

## 2020-01-29 DIAGNOSIS — E039 Hypothyroidism, unspecified: Secondary | ICD-10-CM | POA: Diagnosis not present

## 2020-01-29 DIAGNOSIS — Z23 Encounter for immunization: Secondary | ICD-10-CM | POA: Insufficient documentation

## 2020-01-29 DIAGNOSIS — Y9259 Other trade areas as the place of occurrence of the external cause: Secondary | ICD-10-CM | POA: Diagnosis not present

## 2020-01-29 DIAGNOSIS — L0291 Cutaneous abscess, unspecified: Secondary | ICD-10-CM

## 2020-01-29 DIAGNOSIS — Y999 Unspecified external cause status: Secondary | ICD-10-CM | POA: Diagnosis not present

## 2020-01-29 DIAGNOSIS — Z794 Long term (current) use of insulin: Secondary | ICD-10-CM | POA: Insufficient documentation

## 2020-01-29 DIAGNOSIS — E1142 Type 2 diabetes mellitus with diabetic polyneuropathy: Secondary | ICD-10-CM | POA: Diagnosis not present

## 2020-01-29 DIAGNOSIS — L02416 Cutaneous abscess of left lower limb: Secondary | ICD-10-CM | POA: Diagnosis not present

## 2020-01-29 DIAGNOSIS — E1165 Type 2 diabetes mellitus with hyperglycemia: Secondary | ICD-10-CM | POA: Diagnosis not present

## 2020-01-29 DIAGNOSIS — I10 Essential (primary) hypertension: Secondary | ICD-10-CM | POA: Diagnosis not present

## 2020-01-29 MED ORDER — TETANUS-DIPHTH-ACELL PERTUSSIS 5-2.5-18.5 LF-MCG/0.5 IM SUSP
0.5000 mL | Freq: Once | INTRAMUSCULAR | Status: AC
  Administered 2020-01-29: 0.5 mL via INTRAMUSCULAR
  Filled 2020-01-29: qty 0.5

## 2020-01-29 MED ORDER — DOXYCYCLINE HYCLATE 100 MG PO CAPS: 100.0000 mg | ORAL_CAPSULE | Freq: Two times a day (BID) | ORAL | 0 refills | Status: AC

## 2020-01-29 MED ORDER — DOXYCYCLINE HYCLATE 100 MG PO TABS
100.0000 mg | ORAL_TABLET | Freq: Once | ORAL | Status: AC
  Administered 2020-01-29: 100 mg via ORAL
  Filled 2020-01-29: qty 1

## 2020-01-29 MED ORDER — OXYCODONE-ACETAMINOPHEN 5-325 MG PO TABS
1.0000 | ORAL_TABLET | Freq: Once | ORAL | Status: AC
  Administered 2020-01-29: 1 via ORAL
  Filled 2020-01-29: qty 1

## 2020-01-29 MED ORDER — NAPROXEN 500 MG PO TABS: 500.0000 mg | ORAL_TABLET | Freq: Two times a day (BID) | ORAL | 0 refills | Status: DC

## 2020-01-29 MED ORDER — FLUCONAZOLE 200 MG PO TABS: ORAL_TABLET | ORAL | 0 refills | Status: DC

## 2020-01-29 MED ORDER — PENTAFLUOROPROP-TETRAFLUOROETH EX AERO
INHALATION_SPRAY | CUTANEOUS | Status: DC | PRN
  Filled 2020-01-29: qty 116

## 2020-01-29 NOTE — Discharge Instructions (Signed)
Take antibiotics as directed. Please take all of your antibiotics until finished.  Take Naproxin as directed.   Apply warm compresses to the area or soak the area in warm water to help continue express drainage.   Keep the wound clean and dry. Gently wash the wound with soap and water and make sure to pat it dry.   Return to the Emergency Department if you experienced any worsening/spreading redness or swelling, fever, worsening pain, or any other worsening or concerning symptoms.

## 2020-01-29 NOTE — ED Triage Notes (Signed)
Pt with insect bite to left lower leg for past 3 days with minimal drainage.  Site red and painful.

## 2020-01-29 NOTE — ED Provider Notes (Signed)
[facility] EMERGENCY DEPARTMENT Provider Note   CSN: [id number] Arrival date & time: 01/29/20  1221     History Chief Complaint  Patient presents with  . Insect Bite    Sonya James is a 54 y.o. female past medical history of diabetes, COPD, hypertension who presents for evaluation of pain, redness, swelling noted to left lower leg x3 days.  She reports that she is currently living at a hotel.  She states she woke up and noticed a small wound noted to her left lower leg.  Unsure if something bit her.  She states that it has been achy and sore that over the last 3 days, is gotten progressively more swollen, more red and more painful.  She has not noted any fevers.  She has been taking BC powder for the pain.  She has still been able to ambulate.  The history is provided by the patient.       Past Medical History:  Diagnosis Date  . Anemia   . Anxiety   . Arthritis   . Asthma   . Autonomic neuropathy   . Constipation   . COPD (chronic obstructive pulmonary disease) ([place])   . CTS (carpal tunnel syndrome)   . Depression   . Diabetes mellitus without complication ([place])   . Endometrial cancer ([place]) 2017  . GERD (gastroesophageal reflux disease)   . Headache(784.0)   . HTN (hypertension)   . Hypercholesterolemia   . Hypothyroidism   . IBS (irritable bowel syndrome)   . Osteoarthritis    osteoarthritis  . Pancreatitis    per patient   . Recurrent boils    buttocks and low back.  . Shortness of breath   . Sleep apnea    CPAP machine  . Uterine cancer ([place]) 2017  . Vitamin D deficiency     Patient Active Problem List   Diagnosis Date Noted  . Mixed hyperlipidemia 01/20/2020  . Chronic bilateral low back pain with bilateral sciatica 10/09/2019  . Other spondylosis with radiculopathy, lumbar region 10/09/2019  . Neck pain 05/22/2019  . Gross hematuria 05/11/2019  . Candidiasis 05/11/2019  . Staph aureus infection 05/11/2019  . Pulmonary embolism without acute cor  pulmonale ([place])   . Uncontrolled type 2 diabetes mellitus with hyperglycemia, with long-term current use of insulin ([place]) 08/04/2017  . Uncontrolled type 2 diabetes mellitus with diabetic polyneuropathy, with long-term current use of insulin ([place]) 08/04/2017  . Hypercalcemia 08/04/2017  . Obesity, Class III, BMI 40-49.9 (morbid obesity) ([place]) 08/04/2017  . Hypokalemia 08/04/2017  . Pulmonary embolism ([place]) 08/03/2017  . Sleep apnea 08/03/2017  . Encounter for screening colonoscopy   . Gastritis without bleeding   . Dysphagia 12/14/2016  . S/P laparoscopic hysterectomy 11/04/2015  . Endometrial cancer ([place]) 10/25/2015  . Diabetes mellitus ([place]) 10/25/2015  . Cancer of endometrium ([place]) 10/18/2015  . Heavy menses 10/11/2015  . COPD exacerbation ([place]) 07/04/2014  . Acute bronchitis 05/25/2013  . Fever 05/25/2013  . Asthma 05/25/2013  . Chronic obstructive pulmonary disease ([place]) 05/25/2013  . Tobacco abuse 05/25/2013  . Obesity 05/25/2013  . Essential hypertension, benign 05/25/2013  . Hypercholesteremia 05/25/2013  . Pedal edema 05/25/2013  . Thyromegaly 05/25/2013  . Abdominal pain 04/11/2011  . Constipation 04/11/2011  . GERD (gastroesophageal reflux disease) 04/11/2011    Past Surgical History:  Procedure Laterality Date  . ABDOMINAL HYSTERECTOMY  2018   endometrial cancer, surgery done at [facility]   . BIOPSY  01/02/2017   Procedure: BIOPSY;  Surgeon: Danie Binder, MD;  Location: [facility] ENDO SUITE;  Service: Endoscopy;;  gastric biopsy  . CESAREAN SECTION  1989  . COLONOSCOPY WITH PROPOFOL N/A 01/02/2017   Dr. Oneida Alar: redundant colon, two 2-3 mm polyps in sigmoid colon (hyperplastic)  . ESOPHAGOGASTRODUODENOSCOPY  05/22/2011   Dr. Oneida Alar: H.pylori gastritis   . ESOPHAGOGASTRODUODENOSCOPY (EGD) WITH PROPOFOL N/A 01/02/2017   Dr. 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 FOOT SURGERY Left    tendon repair  .  OOPHORECTOMY    . POLYPECTOMY  01/02/2017   Procedure: POLYPECTOMY;  Surgeon: Danie Binder, MD;  Location: [facility] ENDO SUITE;  Service: Endoscopy;;  sigmoid colon polyps times 2  . REDUCTION MAMMAPLASTY  1996  . SAVORY DILATION N/A 01/02/2017   Procedure: SAVORY DILATION;  Surgeon: Danie Binder, MD;  Location: [facility] ENDO SUITE;  Service: Endoscopy;  Laterality: N/A;  . TUBAL LIGATION       OB History    Gravida  1   Para  1   Term  1   Preterm      AB      Living  1     SAB      TAB      Ectopic      Multiple      Live Births  1           Family History  Problem Relation Age of Onset  . Diabetes Mother   . Hypertension Mother   . COPD Mother   . Asthma Mother   . Hypercholesterolemia Mother   . Hypertension Sister   . Hyperlipidemia Sister   . Diabetes Sister   . Asthma Brother   . Alcohol abuse Brother   . Asthma Son   . Cancer Maternal Grandmother        lung, throat, breast  . Alzheimer's disease Maternal Grandmother   . Hypertension Maternal Grandmother   . Hypercholesterolemia Maternal Grandmother   . Heart disease Maternal Grandfather   . Stroke Maternal Grandfather   . Clotting disorder Maternal Grandfather        blood clots in legs  . Hypertension Sister   . Hyperlipidemia Sister   . Stroke Maternal Aunt   . Clotting disorder Maternal Aunt        blood clots in legs  . Hypertension Maternal Aunt   . Hypercholesterolemia Maternal Aunt   . Hypertension Maternal Aunt   . Asthma Maternal Aunt   . Clotting disorder Maternal Uncle        blood clot -legs traveled to heart and he died of heart attack  . Colon cancer Neg Hx     Social History   Tobacco Use  . Smoking status: Current Some Day Smoker    Packs/day: 0.50    Years: 22.00    Pack years: 11.00    Types: Cigarettes  . Smokeless tobacco: Never Used  Substance Use Topics  . Alcohol use: No  . Drug use: No    Home Medications Prior to Admission medications   Medication Sig  Start Date End Date Taking? Authorizing Provider  albuterol (PROVENTIL) (2.5 MG/3ML) 0.083% nebulizer solution Take 3 mLs (2.5 mg total) by nebulization every 6 (six) hours as needed for wheezing. 08/21/19   Corum, Rex Kras, MD  albuterol (VENTOLIN HFA) 108 (90 Base) MCG/ACT inhaler Inhale 2 puffs into the lungs every 6 (six) hours as needed for wheezing or shortness of breath. 08/21/19  Corum, Rex Kras, MD  apixaban (ELIQUIS) 5 MG TABS tablet Take 1 tablet (5 mg total) by mouth 2 (two) times daily. On 08/12/17 @ 9PM, start 1 tab (5 mg) two times daily. 08/21/19   Maryruth Hancock, MD  atorvastatin (LIPITOR) 80 MG tablet Take 1 tablet (80 mg total) by mouth daily. 12/18/19   Corum, Rex Kras, MD  doxycycline (VIBRAMYCIN) 100 MG capsule Take 1 capsule (100 mg total) by mouth 2 (two) times daily for 7 days. 01/29/20 02/05/20  Volanda Napoleon, PA-C  esomeprazole (NEXIUM) 40 MG capsule Take 1 capsule (40 mg total) by mouth 2 (two) times daily before a meal. 08/21/19   Corum, Rex Kras, MD  Ferrous Gluconate-C-Folic Acid (IRON-C PO) Take by mouth daily.     [provider]  fluconazole (DIFLUCAN) 200 MG tablet Take 1 tablet by mouth after you finish antibiotics. 01/29/20   Volanda Napoleon, PA-C  furosemide (LASIX) 40 MG tablet Take 1 tablet (40 mg total) by mouth daily. 08/21/19   Corum, Rex Kras, MD  glipiZIDE (GLUCOTROL XL) 5 MG 24 hr tablet Take 1 tablet (5 mg total) by mouth daily with breakfast. 01/20/20   Nida, Marella Chimes, MD  HYDROcodone-acetaminophen (NORCO/VICODIN) 5-325 MG tablet One tablet every six hours for pain.  Limit 7 days. 01/01/20   Sanjuana Kava, MD  hydrocortisone (ANUSOL-HC) 2.5 % rectal cream Place 1 application rectally 2 (two) times daily. 01/21/20   Annitta Needs, NP  insulin NPH-regular Human (NOVOLIN 70/30 RELION) (70-30) 100 UNIT/ML injection Inject 60 Units into the skin 2 (two) times daily with a meal. Inject 50 units into skin three times daily (breakfast, supper, bedtime)  01/20/20   Nida, Marella Chimes, MD  linaclotide [facility]) 290 MCG CAPS capsule Take 1 capsule (290 mcg total) by mouth daily before breakfast. 01/21/20   Annitta Needs, NP  lisinopril (ZESTRIL) 40 MG tablet Take 1 tablet (40 mg total) by mouth daily. 12/16/19   Corum, Rex Kras, MD  metFORMIN (GLUCOPHAGE) 1000 MG tablet Take 1 tablet (1,000 mg total) by mouth 2 (two) times daily with a meal. 12/16/19   Corum, Rex Kras, MD  methocarbamol (ROBAXIN) 500 MG tablet Take 1 tablet (500 mg total) by mouth 2 (two) times daily. 08/20/19   Kendrick, Caitlyn S, PA-C  montelukast (SINGULAIR) 10 MG tablet Take 1 tablet (10 mg total) by mouth at bedtime. 08/21/19   Corum, Rex Kras, MD  naproxen (NAPROSYN) 500 MG tablet Take 1 tablet (500 mg total) by mouth 2 (two) times daily. 01/29/20   Volanda Napoleon, PA-C  ondansetron (ZOFRAN ODT) 4 MG disintegrating tablet Take 4 mg by mouth as needed for nausea or vomiting.  10/11/15   [provider]  potassium chloride (KLOR-CON) 10 MEQ tablet Take 1 tablet (10 mEq total) by mouth every evening. 08/21/19   Corum, Rex Kras, MD  silver sulfADIAZINE (SILVADENE) 1 % cream Apply 1 application topically daily. Patient taking differently: Apply 1 application topically as needed.  07/03/18   Jonnie Kind, MD  varenicline (CHANTIX CONTINUING MONTH PAK) 1 MG tablet Take 1 tablet (1 mg total) by mouth 2 (two) times daily. 08/21/19   Corum, Rex Kras, MD  Vitamin D, Ergocalciferol, (DRISDOL) 1.25 MG (50000 UNIT) CAPS capsule Take 1 capsule (50,000 Units total) by mouth every 7 (seven) days. 12/18/19   Corum, Rex Kras, MD    Allergies    Patient has no known allergies.  Review of Systems   Review  of Systems  Constitutional: Negative for fever.  Skin: Positive for color change and wound.  All other systems reviewed and are negative.   Physical Exam Updated Vital Signs BP (!) 143/77 (BP Location: Right Arm)   Pulse 83   Temp 98.5 F (36.9 C) (Oral)   Resp 18   Ht 5\' 5"  (1.651 m)    Wt 104.3 kg   LMP 09/20/2015 (Exact Date)   SpO2 98%   BMI 38.27 kg/m   Physical Exam Vitals and nursing note reviewed.  Constitutional:      Appearance: She is well-developed.  HENT:     Head: Normocephalic and atraumatic.  Eyes:     General: No scleral icterus.       Right eye: No discharge.        Left eye: No discharge.     Conjunctiva/sclera: Conjunctivae normal.  Cardiovascular:     Pulses:          Dorsalis pedis pulses are 2+ on the right side and 2+ on the left side.  Pulmonary:     Effort: Pulmonary effort is normal.  Skin:    General: Skin is warm and dry.     Capillary Refill: Capillary refill takes less than 2 seconds.     Comments: Area of warmth, erythema, tenderness noted to the anterior and lateral tib-fib on the left lower extremity that is about 7 cm in diameter.  There is a central area that appears to be an insect bite.  No active drainage.  Neurological:     Mental Status: She is alert.  Psychiatric:        Speech: Speech normal.        Behavior: Behavior normal.        ED Results / Procedures / Treatments   Labs (all labs ordered are listed, but only abnormal results are displayed) Labs Reviewed - No data to display  EKG None  Radiology No results found.  Procedures .Marland KitchenIncision and Drainage  Date/Time: 01/29/2020 2:27 PM Performed by: Volanda Napoleon, PA-C Authorized by: Volanda Napoleon, PA-C   Consent:    Consent obtained:  Verbal   Consent given by:  Patient   Risks discussed:  Bleeding, incomplete drainage, pain and damage to other organs   Alternatives discussed:  No treatment Universal protocol:    Procedure explained and questions answered to patient or proxy's satisfaction: yes     Relevant documents present and verified: yes     Test results available and properly labeled: yes     Imaging studies available: yes     Required blood products, implants, devices, and special equipment available: yes     Site/side marked:  yes     Immediately prior to procedure a time out was called: yes     Patient identity confirmed:  Verbally with patient Location:    Type:  Abscess   Location:  Lower extremity   Lower extremity location:  Leg   Leg location:  L lower leg Pre-procedure details:    Skin preparation:  Betadine and antiseptic wash Anesthesia (see MAR for exact dosages):    Anesthesia method:  Topical application Procedure type:    Complexity:  Complex Procedure details:    Incision types:  Single straight and stab incision   Incision depth:  Subcutaneous   Scalpel blade:  11   Wound management:  Probed and deloculated, irrigated with saline and extensive cleaning   Drainage:  Purulent   Drainage amount:  Scant  Wound treatment:  Wound left open Post-procedure details:    Patient tolerance of procedure:  Tolerated well, no immediate complications   (including critical care time)  Medications Ordered in ED Medications  pentafluoroprop-tetrafluoroeth (GEBAUERS) aerosol (has no administration in time range)  Tdap (BOOSTRIX) injection 0.5 mL (0.5 mLs Intramuscular Given 01/29/20 1325)  doxycycline (VIBRA-TABS) tablet 100 mg (100 mg Oral Given 01/29/20 1325)  oxyCODONE-acetaminophen (PERCOCET/ROXICET) 5-325 MG per tablet 1 tablet (1 tablet Oral Given 01/29/20 1324)    ED Course  I have reviewed the triage vital signs and the nursing notes.  Pertinent labs & imaging results that were available during my care of the patient were reviewed by me and considered in my medical decision making (see chart for details).    MDM Rules/Calculators/A&P                      54 year old female who presents for evaluation of pain, redness, swelling to left lower leg x3 days.  Living at a hotel currently and states she woke up 3 days ago noticed an area that appeared to be an insect bite on her leg.  Since then, has gotten progressively worsened, or painful.  No fevers.  On initial arrival, she is afebrile,  nontoxic-appearing.  Vital signs are stable.  On exam, she has an area of warmth, erythema, tenderness noted to the left lower extremity with central insect bite.  Bedside ultrasound applied.  No deep abscess.  She does have a small superficial area of fluid measuring about 0.5 cm noted to the top part of the skin.  Discussed results with patient.  Patient would like to go ahead and opened up so it is draining.  I&D performed as documented above.  Patient tolerated procedure well.  Given worsening redness, warmth, erythema, will plan to start patient on antibiotics.  Patient with no known drug allergies.  Encouraged patient on at home supportive care measures. At this time, patient exhibits no emergent life-threatening condition that require further evaluation in ED. Patient had ample opportunity for questions and discussion. All patient's questions were answered with full understanding. Strict return precautions discussed. Patient expresses understanding and agreement to plan.   Portions of this note were generated with Lobbyist. Dictation errors may occur despite best attempts at proofreading.   Final Clinical Impression(s) / ED Diagnoses Final diagnoses:  Insect bite of left lower leg, initial encounter  Cellulitis of left lower extremity  Abscess    Rx / DC Orders ED Discharge Orders         Ordered    doxycycline (VIBRAMYCIN) 100 MG capsule  2 times daily     01/29/20 1433    naproxen (NAPROSYN) 500 MG tablet  2 times daily     01/29/20 1433    fluconazole (DIFLUCAN) 200 MG tablet     01/29/20 1433           Volanda Napoleon, PA-C 01/29/20 1833    Fredia Sorrow, MD 03/02/20 1735

## 2020-02-03 ENCOUNTER — Ambulatory Visit: Payer: Medicaid Other | Admitting: Orthopaedic Surgery

## 2020-02-03 ENCOUNTER — Other Ambulatory Visit (HOSPITAL_COMMUNITY): Payer: Self-pay | Admitting: *Deleted

## 2020-02-03 DIAGNOSIS — I2782 Chronic pulmonary embolism: Secondary | ICD-10-CM

## 2020-02-03 DIAGNOSIS — I2699 Other pulmonary embolism without acute cor pulmonale: Secondary | ICD-10-CM

## 2020-02-04 ENCOUNTER — Other Ambulatory Visit: Payer: Self-pay

## 2020-02-04 ENCOUNTER — Inpatient Hospital Stay (HOSPITAL_COMMUNITY): Payer: Medicaid Other | Attending: Hematology

## 2020-02-04 DIAGNOSIS — F17210 Nicotine dependence, cigarettes, uncomplicated: Secondary | ICD-10-CM | POA: Diagnosis not present

## 2020-02-04 DIAGNOSIS — Z7901 Long term (current) use of anticoagulants: Secondary | ICD-10-CM | POA: Insufficient documentation

## 2020-02-04 DIAGNOSIS — Z86711 Personal history of pulmonary embolism: Secondary | ICD-10-CM | POA: Diagnosis present

## 2020-02-04 DIAGNOSIS — I2782 Chronic pulmonary embolism: Secondary | ICD-10-CM

## 2020-02-04 DIAGNOSIS — I2699 Other pulmonary embolism without acute cor pulmonale: Secondary | ICD-10-CM

## 2020-02-04 LAB — CBC WITH DIFFERENTIAL/PLATELET
Abs Immature Granulocytes: 0.08 10*3/uL — ABNORMAL HIGH (ref 0.00–0.07)
Basophils Absolute: 0.1 10*3/uL (ref 0.0–0.1)
Basophils Relative: 1 %
Eosinophils Absolute: 0.4 10*3/uL (ref 0.0–0.5)
Eosinophils Relative: 5 %
HCT: 45.5 % (ref 36.0–46.0)
Hemoglobin: 14.2 g/dL (ref 12.0–15.0)
Immature Granulocytes: 1 %
Lymphocytes Relative: 24 %
Lymphs Abs: 1.8 10*3/uL (ref 0.7–4.0)
MCH: 30.3 pg (ref 26.0–34.0)
MCHC: 31.2 g/dL (ref 30.0–36.0)
MCV: 97 fL (ref 80.0–100.0)
Monocytes Absolute: 0.4 10*3/uL (ref 0.1–1.0)
Monocytes Relative: 6 %
Neutro Abs: 4.7 10*3/uL (ref 1.7–7.7)
Neutrophils Relative %: 63 %
Platelets: 319 10*3/uL (ref 150–400)
RBC: 4.69 MIL/uL (ref 3.87–5.11)
RDW: 12.4 % (ref 11.5–15.5)
WBC: 7.4 10*3/uL (ref 4.0–10.5)
nRBC: 0 % (ref 0.0–0.2)

## 2020-02-04 LAB — D-DIMER, QUANTITATIVE: D-Dimer, Quant: <0.27 ug/mL-FEU (ref 0.00–0.50)

## 2020-02-05 DIAGNOSIS — Z5181 Encounter for therapeutic drug level monitoring: Secondary | ICD-10-CM | POA: Diagnosis not present

## 2020-02-06 ENCOUNTER — Ambulatory Visit: Payer: Medicaid Other | Admitting: "Endocrinology

## 2020-02-10 DIAGNOSIS — H524 Presbyopia: Secondary | ICD-10-CM | POA: Diagnosis not present

## 2020-02-10 DIAGNOSIS — H52223 Regular astigmatism, bilateral: Secondary | ICD-10-CM | POA: Diagnosis not present

## 2020-02-10 DIAGNOSIS — H5213 Myopia, bilateral: Secondary | ICD-10-CM | POA: Diagnosis not present

## 2020-02-10 LAB — HM DIABETES EYE EXAM

## 2020-02-11 ENCOUNTER — Inpatient Hospital Stay (HOSPITAL_COMMUNITY): Payer: Medicaid Other | Admitting: Nurse Practitioner

## 2020-02-11 ENCOUNTER — Other Ambulatory Visit: Payer: Self-pay

## 2020-02-11 DIAGNOSIS — Z801 Family history of malignant neoplasm of trachea, bronchus and lung: Secondary | ICD-10-CM | POA: Diagnosis not present

## 2020-02-11 DIAGNOSIS — K589 Irritable bowel syndrome without diarrhea: Secondary | ICD-10-CM | POA: Diagnosis not present

## 2020-02-11 DIAGNOSIS — Z90721 Acquired absence of ovaries, unilateral: Secondary | ICD-10-CM | POA: Diagnosis not present

## 2020-02-11 DIAGNOSIS — J441 Chronic obstructive pulmonary disease with (acute) exacerbation: Secondary | ICD-10-CM | POA: Diagnosis not present

## 2020-02-11 DIAGNOSIS — Z90710 Acquired absence of both cervix and uterus: Secondary | ICD-10-CM | POA: Diagnosis not present

## 2020-02-11 DIAGNOSIS — Z86711 Personal history of pulmonary embolism: Secondary | ICD-10-CM | POA: Insufficient documentation

## 2020-02-11 DIAGNOSIS — Z7901 Long term (current) use of anticoagulants: Secondary | ICD-10-CM | POA: Insufficient documentation

## 2020-02-11 DIAGNOSIS — E782 Mixed hyperlipidemia: Secondary | ICD-10-CM | POA: Insufficient documentation

## 2020-02-11 DIAGNOSIS — Z794 Long term (current) use of insulin: Secondary | ICD-10-CM | POA: Insufficient documentation

## 2020-02-11 DIAGNOSIS — F17210 Nicotine dependence, cigarettes, uncomplicated: Secondary | ICD-10-CM | POA: Insufficient documentation

## 2020-02-11 DIAGNOSIS — I10 Essential (primary) hypertension: Secondary | ICD-10-CM | POA: Insufficient documentation

## 2020-02-11 DIAGNOSIS — E039 Hypothyroidism, unspecified: Secondary | ICD-10-CM | POA: Insufficient documentation

## 2020-02-11 DIAGNOSIS — Z8249 Family history of ischemic heart disease and other diseases of the circulatory system: Secondary | ICD-10-CM | POA: Diagnosis not present

## 2020-02-11 DIAGNOSIS — Z803 Family history of malignant neoplasm of breast: Secondary | ICD-10-CM | POA: Diagnosis not present

## 2020-02-11 DIAGNOSIS — E876 Hypokalemia: Secondary | ICD-10-CM | POA: Insufficient documentation

## 2020-02-11 DIAGNOSIS — E1142 Type 2 diabetes mellitus with diabetic polyneuropathy: Secondary | ICD-10-CM | POA: Insufficient documentation

## 2020-02-11 DIAGNOSIS — Z8542 Personal history of malignant neoplasm of other parts of uterus: Secondary | ICD-10-CM | POA: Diagnosis not present

## 2020-02-11 DIAGNOSIS — G4730 Sleep apnea, unspecified: Secondary | ICD-10-CM | POA: Diagnosis not present

## 2020-02-11 DIAGNOSIS — Z8349 Family history of other endocrine, nutritional and metabolic diseases: Secondary | ICD-10-CM | POA: Insufficient documentation

## 2020-02-11 DIAGNOSIS — Z833 Family history of diabetes mellitus: Secondary | ICD-10-CM | POA: Diagnosis not present

## 2020-02-11 LAB — CBC WITH DIFFERENTIAL/PLATELET
Abs Immature Granulocytes: 0.06 10*3/uL (ref 0.00–0.07)
Basophils Absolute: 0.1 10*3/uL (ref 0.0–0.1)
Basophils Relative: 1 %
Eosinophils Absolute: 0.4 10*3/uL (ref 0.0–0.5)
Eosinophils Relative: 5 %
HCT: 42.4 % (ref 36.0–46.0)
Hemoglobin: 13.8 g/dL (ref 12.0–15.0)
Immature Granulocytes: 1 %
Lymphocytes Relative: 30 %
Lymphs Abs: 2.4 10*3/uL (ref 0.7–4.0)
MCH: 31.2 pg (ref 26.0–34.0)
MCHC: 32.5 g/dL (ref 30.0–36.0)
MCV: 95.7 fL (ref 80.0–100.0)
Monocytes Absolute: 0.5 10*3/uL (ref 0.1–1.0)
Monocytes Relative: 6 %
Neutro Abs: 4.5 10*3/uL (ref 1.7–7.7)
Neutrophils Relative %: 57 %
Platelets: 308 10*3/uL (ref 150–400)
RBC: 4.43 MIL/uL (ref 3.87–5.11)
RDW: 12.7 % (ref 11.5–15.5)
WBC: 7.8 10*3/uL (ref 4.0–10.5)
nRBC: 0 % (ref 0.0–0.2)

## 2020-02-11 LAB — D-DIMER, QUANTITATIVE: D-Dimer, Quant: 0.31 ug/mL-FEU (ref 0.00–0.50)

## 2020-02-11 NOTE — Assessment & Plan Note (Signed)
1.  Unprovoked pulmonary embolism: -Diagnosed in November 2018, on Eliquis 5 mg twice daily, without any bleeding complications. -She did not have any risk factors for pulmonary embolism. -Her blood work was negative for lupus anticoagulant, anticardiolipin antibodies, antibeta-2 glycoprotein 1 antibodies.  It was also negative for other tests like antithrombin III, [place] [address] a mutation, protein C and protein S deficiencies. -CT CAP in May 2019 was negative for any metastatic disease.  This was done because of her history of endometrial cancer.  As she had unprovoked pulmonary embolism, her chance of recurrent DVT is about 8% at year 1 and about 40% at year 5.  At this time the benefits of continued anticoagulation outweigh the risk.  Hence I have recommended indefinite anticoagulation.  Patient is agreeable to this option. -D-dimer from 01/31/2019 was negative. -She is tolerating Eliquis well.  We will continue with anticoagulation therapy. -She will return to the clinic in 1 year or sooner if needed.

## 2020-02-11 NOTE — Progress Notes (Unsigned)
Sonya James, [place] [address]   CLINIC:  Medical Oncology/Hematology  PCP:  Maryruth Hancock, [place] [id number] [phone]   REASON FOR VISIT: Follow-up for ***   CURRENT THERAPY: ***   INTERVAL HISTORY:  Sonya James 54 y.o. female returns for routine follow-up  Denies any nausea, vomiting, or diarrhea. Denies any new pains. Had not noticed any recent bleeding such as epistaxis, hematuria or hematochezia. Denies recent chest pain on exertion, shortness of breath on minimal exertion, pre-syncopal episodes, or palpitations. Denies any numbness or tingling in hands or feet. Denies any recent fevers, infections, or recent hospitalizations. Patient reports appetite at ***% and energy level at ***%.   REVIEW OF SYSTEMS:  Review of Systems - Oncology   PAST MEDICAL/SURGICAL HISTORY:  Past Medical History:  Diagnosis Date   Anemia    Anxiety    Arthritis    Asthma    Autonomic neuropathy    Constipation    COPD (chronic obstructive pulmonary disease) (HCC)    CTS (carpal tunnel syndrome)    Depression    Diabetes mellitus without complication ([place])    Endometrial cancer ([place]) 2017   GERD (gastroesophageal reflux disease)    Headache(784.0)    HTN (hypertension)    Hypercholesterolemia    Hypothyroidism    IBS (irritable bowel syndrome)    Osteoarthritis    osteoarthritis   Pancreatitis    per patient    Recurrent boils    buttocks and low back.   Shortness of breath    Sleep apnea    CPAP machine   Uterine cancer ([place]) 2017   Vitamin D deficiency    Past Surgical History:  Procedure Laterality Date   ABDOMINAL HYSTERECTOMY  2018   endometrial cancer, surgery done at [facility]  01/02/2017   Procedure: BIOPSY;  Surgeon: Danie Binder, MD;  [facility];  Service: Endoscopy;;  gastric biopsy   CESAREAN SECTION  1989   COLONOSCOPY WITH PROPOFOL N/A  01/02/2017   Dr. Oneida Alar: redundant colon, two 2-3 mm polyps in sigmoid colon (hyperplastic)   ESOPHAGOGASTRODUODENOSCOPY  05/22/2011   Dr. Oneida Alar: H.pylori gastritis    ESOPHAGOGASTRODUODENOSCOPY (EGD) WITH PROPOFOL N/A 01/02/2017   Dr. 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 s/p dilation, moderate gastritis (negative H.pylori)   FOOT SURGERY Left    tendon repair   OOPHORECTOMY     POLYPECTOMY  01/02/2017   Procedure: POLYPECTOMY;  Surgeon: Danie Binder, MD;  [facility];  Service: Endoscopy;;  sigmoid colon polyps times 2   REDUCTION MAMMAPLASTY  1996   SAVORY DILATION N/A 01/02/2017   Procedure: SAVORY DILATION;  Surgeon: Danie Binder, MD;  [facility];  Service: Endoscopy;  Laterality: N/A;   TUBAL LIGATION       SOCIAL HISTORY:  Social History   Socioeconomic History   Marital status: Married    Spouse name: Not on file   Number of children: 1   Years of education: Not on file   Highest education level: Not on file  Occupational History   Not on file  Tobacco Use   Smoking status: Current Some Day Smoker    Packs/day: 0.50    Years: 22.00    Pack years: 11.00    Types: Cigarettes   Smokeless tobacco: Never Used  Substance and Sexual Activity   Alcohol use: No   Drug use: No  Sexual activity: Yes    Birth control/protection: Surgical    Comment: hyst  Other Topics Concern   Not on file  Social History Narrative   Drinks coffee occasionally, soda 2-3 daily    Social Determinants of Health   Financial Resource Strain:    Difficulty of Paying Living Expenses:   Food Insecurity:    Worried About Charity fundraiser in the Last Year:    Arboriculturist in the Last Year:   Transportation Needs:    Film/video editor (Medical):    Lack of Transportation (Non-Medical):   Physical Activity:    Days of Exercise per Week:    Minutes of Exercise per Session:   Stress:    Feeling of Stress :   Social  Connections:    Frequency of Communication with Friends and Family:    Frequency of Social Gatherings with Friends and Family:    Attends Religious Services:    Active Member of Clubs or Organizations:    Attends Music therapist:    Marital Status:   Intimate Partner Violence:    Fear of Current or Ex-Partner:    Emotionally Abused:    Physically Abused:    Sexually Abused:     FAMILY HISTORY:  Family History  Problem Relation Age of Onset   Diabetes Mother    Hypertension Mother    COPD Mother    Asthma Mother    Hypercholesterolemia Mother    Hypertension Sister    Hyperlipidemia Sister    Diabetes Sister    Asthma Brother    Alcohol abuse Brother    Asthma Son    Cancer Maternal Grandmother        lung, throat, breast   Alzheimer's disease Maternal Grandmother    Hypertension Maternal Grandmother    Hypercholesterolemia Maternal Grandmother    Heart disease Maternal Grandfather    Stroke Maternal Grandfather    Clotting disorder Maternal Grandfather        blood clots in legs   Hypertension Sister    Hyperlipidemia Sister    Stroke Maternal Aunt    Clotting disorder Maternal Aunt        blood clots in legs   Hypertension Maternal Aunt    Hypercholesterolemia Maternal Aunt    Hypertension Maternal Aunt    Asthma Maternal Aunt    Clotting disorder Maternal Uncle        blood clot -legs traveled to heart and he died of heart attack   Colon cancer Neg Hx     CURRENT MEDICATIONS:  Outpatient Encounter Medications as of 02/11/2020  Medication Sig   albuterol (PROVENTIL) (2.5 MG/3ML) 0.083% nebulizer solution Take 3 mLs (2.5 mg total) by nebulization every 6 (six) hours as needed for wheezing.   albuterol (VENTOLIN HFA) 108 (90 Base) MCG/ACT inhaler Inhale 2 puffs into the lungs every 6 (six) hours as needed for wheezing or shortness of breath.   apixaban (ELIQUIS) 5 MG TABS tablet Take 1 tablet (5 mg total) by  mouth 2 (two) times daily. On 08/12/17 @ 9PM, start 1 tab (5 mg) two times daily.   atorvastatin (LIPITOR) 80 MG tablet Take 1 tablet (80 mg total) by mouth daily.   esomeprazole (NEXIUM) 40 MG capsule Take 1 capsule (40 mg total) by mouth 2 (two) times daily before a meal.   Ferrous Gluconate-C-Folic Acid (IRON-C PO) Take by mouth daily.    fluconazole (DIFLUCAN) 200 MG tablet Take 1 tablet by mouth  after you finish antibiotics.   furosemide (LASIX) 40 MG tablet Take 1 tablet (40 mg total) by mouth daily.   glipiZIDE (GLUCOTROL XL) 5 MG 24 hr tablet Take 1 tablet (5 mg total) by mouth daily with breakfast.   HYDROcodone-acetaminophen (NORCO/VICODIN) 5-325 MG tablet One tablet every six hours for pain.  Limit 7 days.   hydrocortisone (ANUSOL-HC) 2.5 % rectal cream Place 1 application rectally 2 (two) times daily.   insulin NPH-regular Human (NOVOLIN 70/30 RELION) (70-30) 100 UNIT/ML injection Inject 60 Units into the skin 2 (two) times daily with a meal. Inject 50 units into skin three times daily (breakfast, supper, bedtime)   linaclotide (LINZESS) 290 MCG CAPS capsule Take 1 capsule (290 mcg total) by mouth daily before breakfast.   lisinopril (ZESTRIL) 40 MG tablet Take 1 tablet (40 mg total) by mouth daily.   metFORMIN (GLUCOPHAGE) 1000 MG tablet Take 1 tablet (1,000 mg total) by mouth 2 (two) times daily with a meal.   methocarbamol (ROBAXIN) 500 MG tablet Take 1 tablet (500 mg total) by mouth 2 (two) times daily.   montelukast (SINGULAIR) 10 MG tablet Take 1 tablet (10 mg total) by mouth at bedtime.   naproxen (NAPROSYN) 500 MG tablet Take 1 tablet (500 mg total) by mouth 2 (two) times daily.   ondansetron (ZOFRAN ODT) 4 MG disintegrating tablet Take 4 mg by mouth as needed for nausea or vomiting.    potassium chloride (KLOR-CON) 10 MEQ tablet Take 1 tablet (10 mEq total) by mouth every evening.   silver sulfADIAZINE (SILVADENE) 1 % cream Apply 1 application topically daily.  (Patient taking differently: Apply 1 application topically as needed. )   varenicline (CHANTIX CONTINUING MONTH PAK) 1 MG tablet Take 1 tablet (1 mg total) by mouth 2 (two) times daily.   Vitamin D, Ergocalciferol, (DRISDOL) 1.25 MG (50000 UNIT) CAPS capsule Take 1 capsule (50,000 Units total) by mouth every 7 (seven) days.   No facility-administered encounter medications on file as of 02/11/2020.    ALLERGIES:  No Known Allergies   PHYSICAL EXAM:  ECOG Performance status: ***  There were no vitals filed for this visit. There were no vitals filed for this visit. Physical Exam   LABORATORY DATA:  I have reviewed the labs as listed.  CBC    Component Value Date/Time   WBC 7.4 02/04/2020 0952   RBC 4.69 02/04/2020 0952   HGB 14.2 02/04/2020 0952   HCT 45.5 02/04/2020 0952   PLT 319 02/04/2020 0952   MCV 97.0 02/04/2020 0952   MCH 30.3 02/04/2020 0952   MCHC 31.2 02/04/2020 0952   RDW 12.4 02/04/2020 0952   LYMPHSABS 1.8 02/04/2020 0952   MONOABS 0.4 02/04/2020 0952   EOSABS 0.4 02/04/2020 0952   BASOSABS 0.1 02/04/2020 0952   CMP Latest Ref Rng & Units 12/17/2019 08/27/2019 01/28/2018  Glucose 65 - 99 mg/dL 171(H) 146(H) -  BUN 7 - 25 mg/dL 7 9 -  Creatinine 0.50 - 1.05 mg/dL 0.83 0.89 0.80  Sodium 135 - 146 mmol/L 141 142 -  Potassium 3.5 - 5.3 mmol/L 4.2 4.0 -  Chloride 98 - 110 mmol/L 106 105 -  CO2 20 - 32 mmol/L 29 30 -  Calcium 8.6 - 10.4 mg/dL 10.1 10.1 -  Total Protein 6.1 - 8.1 g/dL 7.2 7.4 -  Total Bilirubin 0.2 - 1.2 mg/dL 0.5 0.6 -  Alkaline Phos 38 - 126 U/L - 92 -  AST 10 - 35 U/L 7(L) 14(L) -  ALT 6 - 29 U/L 11 13 -    DIAGNOSTIC IMAGING:  I have independently reviewed the scans and discussed with the patient.  ASSESSMENT & PLAN:  Pulmonary embolism ([place]) 1.  Unprovoked pulmonary embolism: -Diagnosed in November 2018, on Eliquis 5 mg twice daily, without any bleeding complications. -She did not have any risk factors for pulmonary embolism. -Her  blood work was negative for lupus anticoagulant, anticardiolipin antibodies, antibeta-2 glycoprotein 1 antibodies.  It was also negative for other tests like antithrombin III[place] [address] a mutation, protein C and protein S deficiencies. -CT CAP in May 2019 was negative for any metastatic disease.  This was done because of her history of endometrial cancer.  As she had unprovoked pulmonary embolism, her chance of recurrent DVT is about 8% at year 1 and about 40% at year 5.  At this time the benefits of continued anticoagulation outweigh the risk.  Hence I have recommended indefinite anticoagulation.  Patient is agreeable to this option. -D-dimer from 01/31/2019 was negative. -She is tolerating Eliquis well.  We will continue with anticoagulation therapy. -She will return to the clinic in 1 year or sooner if needed.     Orders placed this encounter:  No orders of the defined types were placed in this encounter.     Derek Jack, MD [place] [phone]

## 2020-02-18 ENCOUNTER — Inpatient Hospital Stay (HOSPITAL_COMMUNITY): Payer: Medicaid Other | Attending: Nurse Practitioner | Admitting: Nurse Practitioner

## 2020-02-18 DIAGNOSIS — I2782 Chronic pulmonary embolism: Secondary | ICD-10-CM | POA: Diagnosis not present

## 2020-02-18 DIAGNOSIS — H5213 Myopia, bilateral: Secondary | ICD-10-CM | POA: Diagnosis not present

## 2020-02-18 NOTE — Assessment & Plan Note (Signed)
1.  Unprovoked pulmonary embolism: -Diagnosed in November 2018, on Eliquis 5 mg twice daily, without any bleeding complications. -She did not have any risk factors for pulmonary embolism. -Her blood work was negative for lupus anticoagulant, anticardiolipin antibodies, antibeta-2 glycoprotein 1 antibodies.  It was also negative for other tests like antithrombin III[place] [address] a mutation, protein C and protein S deficiencies. -CT CAP in May 2019 was negative for any metastatic disease.  This was done because of her history of endometrial cancer.  As she had unprovoked pulmonary embolism, her chance of recurrent DVT is about 8% at year 1 and about 40% at year 5.  At this time the benefits of continued anticoagulation outweigh the risk.  Hence I have recommended indefinite anticoagulation.  Patient is agreeable to this option. -D-dimer from 02/11/2020 was negative.  CBC from 02/11/2020 was also WNL. -She is tolerating Eliquis well.  We will continue with anticoagulation therapy. -She will return to the clinic in 1 year or sooner if needed.

## 2020-02-18 NOTE — Progress Notes (Signed)
[facility] Follow up:    Maryruth Hancock, MD [place] [address]   DIAGNOSIS: Pulmonary embolism  CURRENT THERAPY: Eliquis  INTERVAL HISTORY: Sonya James 54 y.o. female was called for a telephone visit to follow-up for her pulmonary embolism.  Patient reports she is taking her Eliquis as prescribed.  She has no unwanted side effects.  She denies any bright red bleeding per rectum or melena.  She denies any easy bruising or bleeding.  She has no problems getting her medication. Denies any nausea, vomiting, or diarrhea. Denies any new pains. Had not noticed any recent bleeding such as epistaxis, hematuria or hematochezia. Denies recent chest pain on exertion, shortness of breath on minimal exertion, pre-syncopal episodes, or palpitations. Denies any numbness or tingling in hands or feet. Denies any recent fevers, infections, or recent hospitalizations. Patient reports appetite at 100% and energy level at 100%.  She is eating well maintain her weight at this time.    Patient Active Problem List   Diagnosis Date Noted  . Mixed hyperlipidemia 01/20/2020  . Chronic bilateral low back pain with bilateral sciatica 10/09/2019  . Other spondylosis with radiculopathy, lumbar region 10/09/2019  . Neck pain 05/22/2019  . Gross hematuria 05/11/2019  . Candidiasis 05/11/2019  . Staph aureus infection 05/11/2019  . Pulmonary embolism without acute cor pulmonale ([place])   . Uncontrolled type 2 diabetes mellitus with hyperglycemia, with long-term current use of insulin ([place]) 08/04/2017  . Uncontrolled type 2 diabetes mellitus with diabetic polyneuropathy, with long-term current use of insulin ([place]) 08/04/2017  . Hypercalcemia 08/04/2017  . Obesity, Class III, BMI 40-49.9 (morbid obesity) ([place]) 08/04/2017  . Hypokalemia 08/04/2017  . Pulmonary embolism ([place]) 08/03/2017  . Sleep apnea 08/03/2017  . Encounter for screening colonoscopy   . Gastritis without bleeding    . Dysphagia 12/14/2016  . S/P laparoscopic hysterectomy 11/04/2015  . Endometrial cancer ([place]) 10/25/2015  . Diabetes mellitus ([place]) 10/25/2015  . Cancer of endometrium ([place]) 10/18/2015  . Heavy menses 10/11/2015  . COPD exacerbation ([facility]) 07/04/2014  . Acute bronchitis 05/25/2013  . Fever 05/25/2013  . Asthma 05/25/2013  . Chronic obstructive pulmonary disease ([place]) 05/25/2013  . Tobacco abuse 05/25/2013  . Obesity 05/25/2013  . Essential hypertension, benign 05/25/2013  . Hypercholesteremia 05/25/2013  . Pedal edema 05/25/2013  . Thyromegaly 05/25/2013  . Abdominal pain 04/11/2011  . Constipation 04/11/2011  . GERD (gastroesophageal reflux disease) 04/11/2011    has No Known Allergies.  MEDICAL HISTORY: Past Medical History:  Diagnosis Date  . Anemia   . Anxiety   . Arthritis   . Asthma   . Autonomic neuropathy   . Constipation   . COPD (chronic obstructive pulmonary disease) ([place])   . CTS (carpal tunnel syndrome)   . Depression   . Diabetes mellitus without complication ([place])   . Endometrial cancer ([facility]) 2017  . GERD (gastroesophageal reflux disease)   . Headache(784.0)   . HTN (hypertension)   . Hypercholesterolemia   . Hypothyroidism   . IBS (irritable bowel syndrome)   . Osteoarthritis    osteoarthritis  . Pancreatitis    per patient   . Recurrent boils    buttocks and low back.  . Shortness of breath   . Sleep apnea    CPAP machine  . Uterine cancer ([facility]) 2017  . Vitamin D deficiency     SURGICAL HISTORY: Past Surgical History:  Procedure Laterality Date  . ABDOMINAL HYSTERECTOMY  2018  endometrial cancer, surgery done at [facility]   . BIOPSY  01/02/2017   Procedure: BIOPSY;  Surgeon: Danie Binder, MD;  [facility];  Service: Endoscopy;;  gastric biopsy  . CESAREAN SECTION  1989  . COLONOSCOPY WITH PROPOFOL N/A 01/02/2017   Dr. Oneida Alar: redundant colon, two 2-3 mm polyps in sigmoid colon (hyperplastic)  .  ESOPHAGOGASTRODUODENOSCOPY  05/22/2011   Dr. Oneida Alar: H.pylori gastritis   . ESOPHAGOGASTRODUODENOSCOPY (EGD) WITH PROPOFOL N/A 01/02/2017   Dr. 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 FOOT SURGERY Left    tendon repair  . OOPHORECTOMY    . POLYPECTOMY  01/02/2017   Procedure: POLYPECTOMY;  Surgeon: Danie Binder, MD;  [facility];  Service: Endoscopy;;  sigmoid colon polyps times 2  . REDUCTION MAMMAPLASTY  1996  . SAVORY DILATION N/A 01/02/2017   Procedure: SAVORY DILATION;  Surgeon: Danie Binder, MD;  [facility];  Service: Endoscopy;  Laterality: N/A;  . TUBAL LIGATION      SOCIAL HISTORY: Social History   Socioeconomic History  . Marital status: Married    Spouse name: Not on file  . Number of children: 1  . Years of education: Not on file  . Highest education level: Not on file  Occupational History  . Not on file  Tobacco Use  . Smoking status: Current Some Day Smoker    Packs/day: 0.50    Years: 22.00    Pack years: 11.00    Types: Cigarettes  . Smokeless tobacco: Never Used  Substance and Sexual Activity  . Alcohol use: No  . Drug use: No  . Sexual activity: Yes    Birth control/protection: Surgical    Comment: hyst  Other Topics Concern  . Not on file  Social History Narrative   Drinks coffee occasionally, soda 2-3 daily    Social Determinants of Health   Financial Resource Strain:   . Difficulty of Paying Living Expenses:   Food Insecurity:   . Worried About Charity fundraiser in the Last Year:   . Arboriculturist in the Last Year:   Transportation Needs:   . Film/video editor (Medical):   [name] Lack of Transportation (Non-Medical):   Physical Activity:   . Days of Exercise per Week:   . Minutes of Exercise per Session:   Stress:   . Feeling of Stress :   Social Connections:   . Frequency of Communication with Friends and Family:   . Frequency of Social Gatherings  with Friends and Family:   . Attends Religious Services:   . Active Member of Clubs or Organizations:   . Attends Archivist Meetings:   [name] Marital Status:   Intimate Partner Violence:   . Fear of Current or Ex-Partner:   . Emotionally Abused:   [name] Physically Abused:   . Sexually Abused:     FAMILY HISTORY: Family History  Problem Relation Age of Onset  . Diabetes Mother   . Hypertension Mother   . COPD Mother   . Asthma Mother   . Hypercholesterolemia Mother   . Hypertension Sister   . Hyperlipidemia Sister   . Diabetes Sister   . Asthma Brother   . Alcohol abuse Brother   . Asthma Son   . Cancer Maternal Grandmother        lung, throat, breast  . Alzheimer's disease Maternal Grandmother   . Hypertension Maternal Grandmother   .  Hypercholesterolemia Maternal Grandmother   . Heart disease Maternal Grandfather   . Stroke Maternal Grandfather   . Clotting disorder Maternal Grandfather        blood clots in legs  . Hypertension Sister   . Hyperlipidemia Sister   . Stroke Maternal Aunt   . Clotting disorder Maternal Aunt        blood clots in legs  . Hypertension Maternal Aunt   . Hypercholesterolemia Maternal Aunt   . Hypertension Maternal Aunt   . Asthma Maternal Aunt   . Clotting disorder Maternal Uncle        blood clot -legs traveled to heart and he died of heart attack  . Colon cancer Neg Hx     Review of Systems  All other systems reviewed and are negative.   Vital signs: -Deferred due to telephone visit  Physical Exam -Deferred due to telephone visit -Patient was alert and oriented over the phone and in no acute distress.   LABORATORY DATA:  CBC    Component Value Date/Time   WBC 7.8 02/11/2020 1232   RBC 4.43 02/11/2020 1232   HGB 13.8 02/11/2020 1232   HCT 42.4 02/11/2020 1232   PLT 308 02/11/2020 1232   MCV 95.7 02/11/2020 1232   MCH 31.2 02/11/2020 1232   MCHC 32.5 02/11/2020 1232   RDW 12.7 02/11/2020 1232   LYMPHSABS 2.4  02/11/2020 1232   MONOABS 0.5 02/11/2020 1232   EOSABS 0.4 02/11/2020 1232   BASOSABS 0.1 02/11/2020 1232    CMP     Component Value Date/Time   NA 141 12/17/2019 0946   K 4.2 12/17/2019 0946   CL 106 12/17/2019 0946   CO2 29 12/17/2019 0946   GLUCOSE 171 (H) 12/17/2019 0946   BUN 7 12/17/2019 0946   CREATININE 0.83 12/17/2019 0946   CALCIUM 10.1 12/17/2019 0946   PROT 7.2 12/17/2019 0946   ALBUMIN 4.0 08/27/2019 1558   AST 7 (L) 12/17/2019 0946   ALT 11 12/17/2019 0946   ALKPHOS 92 08/27/2019 1558   BILITOT 0.5 12/17/2019 0946   GFRNONAA 80 12/17/2019 0946   GFRAA 93 12/17/2019 0946    All questions were answered to patient's stated satisfaction. Encouraged patient to call with any new concerns or questions before his next visit to the cancer center and we can certain see him sooner, if needed.     ASSESSMENT and THERAPY PLAN:   Pulmonary embolism ([place]) 1.  Unprovoked pulmonary embolism: -Diagnosed in November 2018, on Eliquis 5 mg twice daily, without any bleeding complications. -She did not have any risk factors for pulmonary embolism. -Her blood work was negative for lupus anticoagulant, anticardiolipin antibodies, antibeta-2 glycoprotein 1 antibodies.  It was also negative for other tests like antithrombin III[place] [address] a mutation, protein C and protein S deficiencies. -CT CAP in May 2019 was negative for any metastatic disease.  This was done because of her history of endometrial cancer.  As she had unprovoked pulmonary embolism, her chance of recurrent DVT is about 8% at year 1 and about 40% at year 5.  At this time the benefits of continued anticoagulation outweigh the risk.  Hence I have recommended indefinite anticoagulation.  Patient is agreeable to this option. -D-dimer from 02/11/2020 was negative.  CBC from 02/11/2020 was also WNL. -She is tolerating Eliquis well.  We will continue with anticoagulation therapy. -She will return to the clinic in 1  year or sooner if needed.   Orders Placed This  Encounter  Procedures  . Lactate dehydrogenase    Standing Status:   Future    Standing Expiration Date:   02/17/2021  . CBC with Differential/Platelet    Standing Status:   Future    Standing Expiration Date:   02/17/2021  . Comprehensive metabolic panel    Standing Status:   Future    Standing Expiration Date:   02/17/2021  . D-dimer, quantitative    Standing Status:   Future    Standing Expiration Date:   02/17/2021    All questions were answered. The patient knows to call the clinic with any problems, questions or concerns. We can certainly see the patient much sooner if necessary. This note was electronically signed.  I provided 29 minutes of non face-to-face telephone visit time during this encounter, and > 50% was spent counseling as documented under my assessment & plan.   Glennie Isle, NP-C 02/18/2020

## 2020-02-27 ENCOUNTER — Telehealth: Payer: Self-pay | Admitting: Physical Medicine and Rehabilitation

## 2020-02-27 NOTE — Telephone Encounter (Signed)
If injection helped could repeat, ask if she saw Dr/ Dionisio Paschal for pain manaagment consult. Remind that we do not do medication management for chronic opioid. IF injection did not help and wants to see me then OV and would still like to see if we can MRI at some point.

## 2020-02-27 NOTE — Telephone Encounter (Signed)
Right L4-5 and L5-S1 facets 10/29/19. MRI ordered in January was denied by insurance. Please advise.

## 2020-02-27 NOTE — Telephone Encounter (Signed)
Called patient and she does see Dr. Dionisio Paschal. She will contact their office to see if he is ok with her having injections here.

## 2020-02-27 NOTE — Telephone Encounter (Signed)
Patient called. She would like an appointment with Dr. Ernestina Patches. Her call back number is [phone]

## 2020-03-04 ENCOUNTER — Other Ambulatory Visit: Payer: Self-pay

## 2020-03-04 ENCOUNTER — Ambulatory Visit: Payer: Medicaid Other | Admitting: Orthopaedic Surgery

## 2020-03-04 ENCOUNTER — Encounter: Payer: Self-pay | Admitting: Orthopaedic Surgery

## 2020-03-04 ENCOUNTER — Ambulatory Visit: Payer: Medicaid Other

## 2020-03-04 DIAGNOSIS — Z7689 Persons encountering health services in other specified circumstances: Secondary | ICD-10-CM | POA: Diagnosis not present

## 2020-03-04 DIAGNOSIS — M25561 Pain in right knee: Secondary | ICD-10-CM

## 2020-03-04 DIAGNOSIS — G8929 Other chronic pain: Secondary | ICD-10-CM | POA: Diagnosis not present

## 2020-03-04 DIAGNOSIS — M25512 Pain in left shoulder: Secondary | ICD-10-CM

## 2020-03-04 DIAGNOSIS — F17210 Nicotine dependence, cigarettes, uncomplicated: Secondary | ICD-10-CM

## 2020-03-04 MED ORDER — HYDROCODONE-ACETAMINOPHEN 5-325 MG PO TABS: ORAL_TABLET | ORAL | 0 refills | Status: DC

## 2020-03-04 NOTE — Progress Notes (Signed)
PROCEDURE NOTE:  The patient requests injections of the right knee , verbal consent was obtained.  The right knee was prepped appropriately after time out was performed.   Sterile technique was observed and injection of 1 cc of Depo-Medrol 40 mg with several cc's of plain xylocaine. Anesthesia was provided by ethyl chloride and a 20-gauge needle was used to inject the knee area. The injection was tolerated well.  A band aid dressing was applied.  The patient was advised to apply ice later today and tomorrow to the injection sight as needed.  PROCEDURE NOTE:  The patient request injection, verbal consent was obtained.  The left shoulder was prepped appropriately after time out was performed.   Sterile technique was observed and injection of 1 cc of Depo-Medrol 40 mg with several cc's of plain xylocaine. Anesthesia was provided by ethyl chloride and a 20-gauge needle was used to inject the shoulder area. A posterior approach was used.  The injection was tolerated well.  A band aid dressing was applied.  The patient was advised to apply ice later today and tomorrow to the injection sight as needed.  X-rays were done of the left shoulder, reported separately.  Consider MRI of shoulder if pain continues.  I have reviewed the [facility] web site prior to prescribing narcotic medicine for this patient.   Return in two months.  Electronically Signed Sanjuana Kava, MD 6/24/20219:03 AM

## 2020-03-10 DIAGNOSIS — M79606 Pain in leg, unspecified: Secondary | ICD-10-CM | POA: Diagnosis not present

## 2020-03-10 DIAGNOSIS — M25569 Pain in unspecified knee: Secondary | ICD-10-CM | POA: Diagnosis not present

## 2020-03-10 DIAGNOSIS — M545 Low back pain: Secondary | ICD-10-CM | POA: Diagnosis not present

## 2020-03-10 DIAGNOSIS — E1140 Type 2 diabetes mellitus with diabetic neuropathy, unspecified: Secondary | ICD-10-CM | POA: Diagnosis not present

## 2020-03-10 DIAGNOSIS — M542 Cervicalgia: Secondary | ICD-10-CM | POA: Diagnosis not present

## 2020-03-10 DIAGNOSIS — Z7689 Persons encountering health services in other specified circumstances: Secondary | ICD-10-CM | POA: Diagnosis not present

## 2020-03-19 DIAGNOSIS — H524 Presbyopia: Secondary | ICD-10-CM | POA: Diagnosis not present

## 2020-03-19 DIAGNOSIS — H52223 Regular astigmatism, bilateral: Secondary | ICD-10-CM | POA: Diagnosis not present

## 2020-03-25 ENCOUNTER — Encounter: Payer: Self-pay | Admitting: Family Medicine

## 2020-03-25 ENCOUNTER — Other Ambulatory Visit: Payer: Self-pay

## 2020-03-25 ENCOUNTER — Ambulatory Visit (INDEPENDENT_AMBULATORY_CARE_PROVIDER_SITE_OTHER): Payer: Medicaid Other | Admitting: Family Medicine

## 2020-03-25 VITALS — BP 146/82 | HR 69 | Temp 97.3°F | Resp 18 | Ht 65.0 in | Wt 238.1 lb

## 2020-03-25 DIAGNOSIS — Z794 Long term (current) use of insulin: Secondary | ICD-10-CM

## 2020-03-25 DIAGNOSIS — E0865 Diabetes mellitus due to underlying condition with hyperglycemia: Secondary | ICD-10-CM

## 2020-03-25 DIAGNOSIS — E782 Mixed hyperlipidemia: Secondary | ICD-10-CM

## 2020-03-25 DIAGNOSIS — Z86711 Personal history of pulmonary embolism: Secondary | ICD-10-CM | POA: Diagnosis not present

## 2020-03-25 DIAGNOSIS — E66813 Obesity, class 3: Secondary | ICD-10-CM

## 2020-03-25 DIAGNOSIS — Z748 Other problems related to care provider dependency: Secondary | ICD-10-CM

## 2020-03-25 DIAGNOSIS — I10 Essential (primary) hypertension: Secondary | ICD-10-CM

## 2020-03-25 DIAGNOSIS — Z598 Other problems related to housing and economic circumstances: Secondary | ICD-10-CM

## 2020-03-25 DIAGNOSIS — IMO0001 Reserved for inherently not codable concepts without codable children: Secondary | ICD-10-CM

## 2020-03-25 DIAGNOSIS — K219 Gastro-esophageal reflux disease without esophagitis: Secondary | ICD-10-CM

## 2020-03-25 NOTE — Patient Instructions (Addendum)
I appreciate the opportunity to provide you with care for your health and wellness. Today we discussed: established care  Follow up: 3 months   Blood sugar check today in office ____ No labs or referrals today  Nice to meet you today!  SKATYehuda Mao: [phone]   Or Hester Mates [phone] Please try to see if this company can help you with transportation  Aging, Disability and Transit Services: [phone]  Ask what services you might be able to get  Please continue to practice social distancing to keep you, your family, and our community safe.  If you must go out, please wear a mask and practice good handwashing.  It was a pleasure to see you and I look forward to continuing to work together on your health and well-being. Please do not hesitate to call the office if you need care or have questions about your care.  Have a wonderful day and week. With Gratitude, Cherly Beach, DNP, AGNP-BC

## 2020-03-25 NOTE — Progress Notes (Signed)
Subjective:  Patient ID: Sonya James, female    DOB: 1965-12-01  Age: 54 y.o. MRN: [id number]  CC:  Chief Complaint  Patient presents with  . New Patient (Initial Visit)    New pt former Dr Holly Bodily pt swollen left ear draining fluid about 2 weeks       HPI  HPI [name] is a 54 year old female who establishes care here at [facility] today.  She was previously seen by Dr. Holly Bodily earlier in the spring.  Today she reports that she does have some swelling in her left ear tragus site.  But reports that she recently had a piercing 2 weeks ago.  She denies having any fevers chills or pus.  But she does report some slight leakage of fluid near the site of the piercing.  She has a history that includes but is not limited to type 2 diabetes, obesity, vitamin D deficiency, hyperlipidemia, hypertension.  She reports taking all of her current medications as directed.  She does not check her blood sugars at home. She is up-to-date on her vaccines.  She reports having eye exam noted to have cataracts.  Reports she does not believe that she has retinopathy from diabetes at this time.  She reports overall she can sleep okay.  She is little bit stressed right now as she is having difficulties with living situation and transportation.  She does not have much access to the Internet so she is unable to apply for certain items.  She reports that her teeth and chewing are okay her appetite is good.  She denies having any changes in her bowel or bladder habits.  But reports that she has IBS and takes Linzess.  She denies having any blood in her urine or stool.  Last colonoscopy was in 2018.  She denies having any memory difficulties.  Denies having any falls or injuries.  Denies having any changes with her vision.  She does wear glasses.  Last mammogram was in 2021.  Today patient denies signs and symptoms of COVID 19 infection including fever, chills, cough, shortness of breath, and headache. Past  Medical, Surgical, Social History, Allergies, and Medications have been Reviewed.   Past Medical History:  Diagnosis Date  . Anemia   . Anxiety   . Arthritis   . Asthma   . Asthma 05/25/2013  . Autonomic neuropathy   . Cancer of endometrium ([place]) 10/18/2015  . Constipation   . COPD (chronic obstructive pulmonary disease) ([place])   . CTS (carpal tunnel syndrome)   . Depression   . Diabetes mellitus without complication ([place])   . Dysphagia 12/14/2016  . Encounter for screening colonoscopy   . Endometrial cancer ([place]) 2017  . Gastritis without bleeding   . GERD (gastroesophageal reflux disease)   . Gross hematuria 05/11/2019  . Headache(784.0)   . Heavy menses 10/11/2015  . HTN (hypertension)   . Hypercholesterolemia   . Hypothyroidism   . IBS (irritable bowel syndrome)   . Osteoarthritis    osteoarthritis  . Pancreatitis    per patient   . Recurrent boils    buttocks and low back.  . S/P laparoscopic hysterectomy 11/04/2015  . Shortness of breath   . Sleep apnea    CPAP machine  . Uncontrolled type 2 diabetes mellitus with diabetic polyneuropathy, with long-term current use of insulin ([place]) 08/04/2017  . Uterine cancer ([place]) 2017  . Vitamin D deficiency     Current Meds  Medication Sig  .  albuterol (PROVENTIL) (2.5 MG/3ML) 0.083% nebulizer solution Take 3 mLs (2.5 mg total) by nebulization every 6 (six) hours as needed for wheezing.  [name] albuterol (VENTOLIN HFA) 108 (90 Base) MCG/ACT inhaler Inhale 2 puffs into the lungs every 6 (six) hours as needed for wheezing or shortness of breath.  [name] apixaban (ELIQUIS) 5 MG TABS tablet Take 1 tablet (5 mg total) by mouth 2 (two) times daily. On 08/12/17 @ 9PM, start 1 tab (5 mg) two times daily.  [name] atorvastatin (LIPITOR) 80 MG tablet Take 1 tablet (80 mg total) by mouth daily.  [name] esomeprazole (NEXIUM) 40 MG capsule Take 1 capsule (40 mg total) by mouth 2 (two) times daily before a meal.  . Ferrous Gluconate-C-Folic Acid (IRON-C PO) Take by  mouth daily.   . fluconazole (DIFLUCAN) 200 MG tablet Take 1 tablet by mouth after you finish antibiotics.  . furosemide (LASIX) 40 MG tablet Take 1 tablet (40 mg total) by mouth daily.  [name] glipiZIDE (GLUCOTROL XL) 5 MG 24 hr tablet Take 1 tablet (5 mg total) by mouth daily with breakfast.  . HYDROcodone-acetaminophen (NORCO/VICODIN) 5-325 MG tablet One tablet every six hours for pain.  Limit 7 days.  . hydrocortisone (ANUSOL-HC) 2.5 % rectal cream Place 1 application rectally 2 (two) times daily.  . insulin NPH-regular Human (NOVOLIN 70/30 RELION) (70-30) 100 UNIT/ML injection Inject 60 Units into the skin 2 (two) times daily with a meal. Inject 50 units into skin three times daily (breakfast, supper, bedtime)  . linaclotide (LINZESS) 290 MCG CAPS capsule Take 1 capsule (290 mcg total) by mouth daily before breakfast.  . lisinopril (ZESTRIL) 40 MG tablet Take 1 tablet (40 mg total) by mouth daily.  . metFORMIN (GLUCOPHAGE) 1000 MG tablet Take 1 tablet (1,000 mg total) by mouth 2 (two) times daily with a meal.  . methocarbamol (ROBAXIN) 500 MG tablet Take 1 tablet (500 mg total) by mouth 2 (two) times daily.  . montelukast (SINGULAIR) 10 MG tablet Take 1 tablet (10 mg total) by mouth at bedtime.  . naproxen (NAPROSYN) 500 MG tablet Take 1 tablet (500 mg total) by mouth 2 (two) times daily.  . ondansetron (ZOFRAN ODT) 4 MG disintegrating tablet Take 4 mg by mouth as needed for nausea or vomiting.   . potassium chloride (KLOR-CON) 10 MEQ tablet Take 1 tablet (10 mEq total) by mouth every evening.  . silver sulfADIAZINE (SILVADENE) 1 % cream Apply 1 application topically daily. (Patient taking differently: Apply 1 application topically as needed. )  . varenicline (CHANTIX CONTINUING MONTH PAK) 1 MG tablet Take 1 tablet (1 mg total) by mouth 2 (two) times daily.  . Vitamin D, Ergocalciferol, (DRISDOL) 1.25 MG (50000 UNIT) CAPS capsule Take 1 capsule (50,000 Units total) by mouth every 7 (seven) days.     ROS:  Review of Systems  Constitutional: Negative.   HENT: Negative.   Eyes: Negative.   Respiratory: Negative.   Cardiovascular: Negative.   Gastrointestinal: Negative.   Genitourinary: Negative.   Musculoskeletal: Negative.   Skin: Negative.        Sore on ear  Neurological: Negative.   Endo/Heme/Allergies: Negative.   Psychiatric/Behavioral: Negative.        Stress  All other systems reviewed and are negative.    Objective:   Today's Vitals: BP (!) 146/82 (BP Location: Right Arm, Patient Position: Sitting, Cuff Size: Normal)   Pulse 69   Temp (!) 97.3 F (36.3 C) (Temporal)   Resp 18   Ht 5'  5" (1.651 m)   Wt 238 lb 1.9 oz (108 kg)   LMP 09/20/2015 (Exact Date)   SpO2 97%   BMI 39.63 kg/m  Vitals with BMI 03/25/2020 01/29/2020 01/29/2020  Height 5\' 5"  - 5\' 5"   Weight 238 lbs 2 oz - 230 lbs  BMI 15.40 - 08.67  Systolic 619 509 -  Diastolic 82 77 -  Pulse 69 83 -     Physical Exam Vitals and nursing note reviewed.  Constitutional:      Appearance: Normal appearance. She is well-developed and well-groomed. She is morbidly obese.  HENT:     Head: Normocephalic and atraumatic.     Right Ear: External ear normal.     Left Ear: External ear normal.     Ears:     Comments: There is some swelling of tragus, and antitragus, no warmth or redness    Mouth/Throat:     Comments: Mask in place Eyes:     General:        Right eye: No discharge.        Left eye: No discharge.     Conjunctiva/sclera: Conjunctivae normal.     Comments: glasses  Cardiovascular:     Rate and Rhythm: Normal rate and regular rhythm.     Pulses: Normal pulses.     Heart sounds: Normal heart sounds.  Pulmonary:     Effort: Pulmonary effort is normal.     Breath sounds: Normal breath sounds.  Musculoskeletal:        General: Normal range of motion.     Cervical back: Normal range of motion and neck supple.  Skin:    General: Skin is warm.  Neurological:     General: No focal  deficit present.     Mental Status: She is alert and oriented to person, place, and time.  Psychiatric:        Attention and Perception: Attention normal.        Mood and Affect: Mood normal.        Speech: Speech normal.        Behavior: Behavior normal. Behavior is cooperative.        Thought Content: Thought content normal.        Cognition and Memory: Cognition normal.        Judgment: Judgment normal.     Assessment   1. Obesity, Class III, BMI 40-49.9 (morbid obesity) ([place])   2. Diabetes mellitus due to underlying condition with hyperglycemia, with long-term current use of insulin ([place])   3. History of pulmonary embolism   4. Essential hypertension, benign   5. Gastroesophageal reflux disease, unspecified whether esophagitis present   6. Mixed hyperlipidemia   7. Assistance needed with transportation   8. Lives in hotel     Tests ordered No orders of the defined types were placed in this encounter.    Plan: Please see assessment and plan per problem list above.   No orders of the defined types were placed in this encounter.   Patient to follow-up in 06/29/2020 .  Perlie Mayo, NP

## 2020-03-28 ENCOUNTER — Encounter: Payer: Self-pay | Admitting: Family Medicine

## 2020-03-28 NOTE — Assessment & Plan Note (Signed)
Inability to currently find an area to living at this time.

## 2020-03-28 NOTE — Assessment & Plan Note (Signed)
Provided with information on [facility] in addition to the aging/disability/transit services of [facility]

## 2020-03-28 NOTE — Assessment & Plan Note (Signed)
Currently followed by Dr Dorris Fetch, will continue treatment plans as per him. I have encouraged weight loss and a well balanced diet and exercise.

## 2020-03-28 NOTE — Assessment & Plan Note (Signed)
On statin, needs diet control.

## 2020-03-28 NOTE — Assessment & Plan Note (Signed)
Obesity is linked to HTN, HLD, DM    Deteriorated  Sonya James is educated about the importance of exercise daily to help with weight management. A minumum of 30 minutes daily is recommended. Additionally, importance of healthy food choices  with portion control discussed.  Wt Readings from Last 3 Encounters:  03/25/20 238 lb 1.9 oz (108 kg)  01/29/20 230 lb (104.3 kg)  01/20/20 236 lb (107 kg)

## 2020-03-28 NOTE — Assessment & Plan Note (Addendum)
On Eliquis and tolerating it well. Denies S&S of bleeding.

## 2020-03-28 NOTE — Assessment & Plan Note (Signed)
Controlled, continue current treatments. Followed by GI

## 2020-03-28 NOTE — Assessment & Plan Note (Signed)
Sonya James is encouraged to maintain a well balanced diet that is low in salt. Elevated today, continue current medication regimen.  Will monitor at future visits and get updated labs.  Additionally, she is also reminded that exercise is beneficial for heart health and control of  Blood pressure. 30-60 minutes daily is recommended-walking was suggested.

## 2020-03-29 ENCOUNTER — Ambulatory Visit: Payer: Medicaid Other | Admitting: Nutrition

## 2020-03-30 ENCOUNTER — Other Ambulatory Visit: Payer: Self-pay | Admitting: *Deleted

## 2020-03-30 ENCOUNTER — Other Ambulatory Visit: Payer: Self-pay | Admitting: "Endocrinology

## 2020-03-30 MED ORDER — GLIPIZIDE ER 5 MG PO TB24: 5.0000 mg | ORAL_TABLET | Freq: Every day | ORAL | 3 refills | Status: DC

## 2020-03-30 MED ORDER — LISINOPRIL 40 MG PO TABS: 40.0000 mg | ORAL_TABLET | Freq: Every day | ORAL | 1 refills | Status: DC

## 2020-03-30 MED ORDER — ATORVASTATIN CALCIUM 80 MG PO TABS: 80.0000 mg | ORAL_TABLET | Freq: Every day | ORAL | 0 refills | Status: DC

## 2020-04-01 ENCOUNTER — Other Ambulatory Visit: Payer: Self-pay | Admitting: "Endocrinology

## 2020-04-08 DIAGNOSIS — Z79891 Long term (current) use of opiate analgesic: Secondary | ICD-10-CM | POA: Insufficient documentation

## 2020-04-12 ENCOUNTER — Other Ambulatory Visit: Payer: Self-pay

## 2020-04-12 ENCOUNTER — Ambulatory Visit (INDEPENDENT_AMBULATORY_CARE_PROVIDER_SITE_OTHER): Payer: Medicaid Other | Admitting: "Endocrinology

## 2020-04-12 ENCOUNTER — Encounter: Payer: Self-pay | Admitting: "Endocrinology

## 2020-04-12 VITALS — BP 146/88 | HR 64 | Ht 65.0 in | Wt 242.0 lb

## 2020-04-12 DIAGNOSIS — E559 Vitamin D deficiency, unspecified: Secondary | ICD-10-CM

## 2020-04-12 DIAGNOSIS — E1169 Type 2 diabetes mellitus with other specified complication: Secondary | ICD-10-CM

## 2020-04-12 DIAGNOSIS — E782 Mixed hyperlipidemia: Secondary | ICD-10-CM | POA: Diagnosis not present

## 2020-04-12 DIAGNOSIS — I10 Essential (primary) hypertension: Secondary | ICD-10-CM | POA: Diagnosis not present

## 2020-04-12 DIAGNOSIS — Z9119 Patient's noncompliance with other medical treatment and regimen: Secondary | ICD-10-CM

## 2020-04-12 DIAGNOSIS — Z794 Long term (current) use of insulin: Secondary | ICD-10-CM

## 2020-04-12 DIAGNOSIS — Z91199 Patient's noncompliance with other medical treatment and regimen due to unspecified reason: Secondary | ICD-10-CM

## 2020-04-12 MED ORDER — METFORMIN HCL 1000 MG PO TABS: 1000.0000 mg | ORAL_TABLET | Freq: Two times a day (BID) | ORAL | 0 refills | Status: DC

## 2020-04-12 MED ORDER — NOVOLIN 70/30 RELION (70-30) 100 UNIT/ML ~~LOC~~ SUSP: 50.0000 [IU] | Freq: Two times a day (BID) | SUBCUTANEOUS | 2 refills | Status: DC

## 2020-04-12 MED ORDER — ACCU-CHEK GUIDE VI STRP: ORAL_STRIP | 2 refills | Status: DC

## 2020-04-12 MED ORDER — ACCU-CHEK GUIDE ME W/DEVICE KIT: 1.0000 | PACK | 0 refills | Status: DC

## 2020-04-12 NOTE — Progress Notes (Signed)
04/12/2020, 1:08 PM  Endocrinology follow-up note   Subjective:    Patient ID: Sonya James, female    DOB: 01-23-1966.  Sonya James is being seen in follow-up after she was seen in consultation for management of currently uncontrolled symptomatic diabetes requested by  Perlie Mayo, NP.   Past Medical History:  Diagnosis Date  . Anemia   . Anxiety   . Arthritis   . Asthma   . Asthma 05/25/2013  . Autonomic neuropathy   . Cancer of endometrium ([place]) 10/18/2015  . Constipation   . COPD (chronic obstructive pulmonary disease) ([place])   . CTS (carpal tunnel syndrome)   . Depression   . Diabetes mellitus without complication ([place])   . Dysphagia 12/14/2016  . Encounter for screening colonoscopy   . Endometrial cancer ([place]) 2017  . Gastritis without bleeding   . GERD (gastroesophageal reflux disease)   . Gross hematuria 05/11/2019  . Headache(784.0)   . Heavy menses 10/11/2015  . HTN (hypertension)   . Hypercholesterolemia   . Hypothyroidism   . IBS (irritable bowel syndrome)   . Osteoarthritis    osteoarthritis  . Pancreatitis    per patient   . Recurrent boils    buttocks and low back.  . S/P laparoscopic hysterectomy 11/04/2015  . Shortness of breath   . Sleep apnea    CPAP machine  . Uncontrolled type 2 diabetes mellitus with diabetic polyneuropathy, with long-term current use of insulin ([place]) 08/04/2017  . Uterine cancer ([place]) 2017  . Vitamin D deficiency     Past Surgical History:  Procedure Laterality Date  . ABDOMINAL HYSTERECTOMY  2018   endometrial cancer, surgery done at [facility]   . BIOPSY  01/02/2017   Procedure: BIOPSY;  Surgeon: Danie Binder, MD;  Location: [facility];  Service: Endoscopy;;  gastric biopsy  . CESAREAN SECTION  1989  . COLONOSCOPY WITH PROPOFOL N/A 01/02/2017   Dr. Oneida Alar: redundant colon, two 2-3 mm polyps in sigmoid colon (hyperplastic)  .  ESOPHAGOGASTRODUODENOSCOPY  05/22/2011   Dr. Oneida Alar: H.pylori gastritis   . ESOPHAGOGASTRODUODENOSCOPY (EGD) WITH PROPOFOL N/A 01/02/2017   Dr. 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 FOOT SURGERY Left    tendon repair  . OOPHORECTOMY    . POLYPECTOMY  01/02/2017   Procedure: POLYPECTOMY;  Surgeon: Danie Binder, MD;  Location: [facility];  Service: Endoscopy;;  sigmoid colon polyps times 2  . REDUCTION MAMMAPLASTY  1996  . SAVORY DILATION N/A 01/02/2017   Procedure: SAVORY DILATION;  Surgeon: Danie Binder, MD;  Location: [facility];  Service: Endoscopy;  Laterality: N/A;  . TUBAL LIGATION      Social History   Socioeconomic History  . Marital status: Married    Spouse name: Not on file  . Number of children: 1  . Years of education: Not on file  . Highest education level: Not on file  Occupational History  . Not on file  Tobacco Use  . Smoking status: Current Some Day Smoker    Packs/day: 0.50    Years:  22.00    Pack years: 11.00    Types: Cigarettes  . Smokeless tobacco: Never Used  Vaping Use  . Vaping Use: Never used  Substance and Sexual Activity  . Alcohol use: No  . Drug use: No  . Sexual activity: Yes    Birth control/protection: Surgical    Comment: hyst  Other Topics Concern  . Not on file  Social History Narrative   Lives with husband and she has been married to for 9 years.  Recently got evicted and has been living in a motel since last July.   She has 1 son that lives in [place].  Reports one grandbaby      Enjoys watching TV mostly [place].      Diet: Eats all food groups   Caffeine: Soda not diet, green tea, coffee about 3 cups of caffeine per day.   Water: Drinks 5-6 8 ounce bottles throughout the day.      Wears a seatbelt.  Currently does not have a vehicle.  Smoke detectors in the facility she is living in.  Does not have any weapons.   Social Determinants of Health    Financial Resource Strain: Low Risk   . Difficulty of Paying Living Expenses: Not hard at all  Food Insecurity: No Food Insecurity  . Worried About Running Out of Food in the Last Year: Never true  . Ran Out of Food in the Last Year: Never true  Transportation Needs: Unmet Transportation Needs  . Lack of Transportation (Medical): Yes  . Lack of Transportation (Non-Medical): Yes  Physical Activity: Insufficiently Active  . Days of Exercise per Week: 3 days  . Minutes of Exercise per Session: 20 min  Stress: No Stress Concern Present  . Feeling of Stress : Not at all  Social Connections: Moderately Isolated  . Frequency of Communication with Friends and Family: More than three times a week  . Frequency of Social Gatherings with Friends and Family: More than three times a week  . Attends Religious Services: 1 to 4 times per year  . Active Member of Clubs or Organizations: No  . Attends Club or Organization Meetings: Never  . Marital Status: Separated    Family History  Problem Relation Age of Onset  . Diabetes Mother   . Hypertension Mother   . COPD Mother   . Asthma Mother   . Hypercholesterolemia Mother   . Hypertension Sister   . Hyperlipidemia Sister   . Diabetes Sister   . Asthma Brother   . Alcohol abuse Brother   . Asthma Son   . Cancer Maternal Grandmother        lung, throat, breast  . Alzheimer's disease Maternal Grandmother   . Hypertension Maternal Grandmother   . Hypercholesterolemia Maternal Grandmother   . Heart disease Maternal Grandfather   . Stroke Maternal Grandfather   . Clotting disorder Maternal Grandfather        blood clots in legs  . Hypertension Sister   . Hyperlipidemia Sister   . Stroke Maternal Aunt   . Clotting disorder Maternal Aunt        blood clots in legs  . Hypertension Maternal Aunt   . Hypercholesterolemia Maternal Aunt   . Hypertension Maternal Aunt   . Asthma Maternal Aunt   . Clotting disorder Maternal Uncle        blood  clot -legs traveled to heart and he died of heart attack  . Colon cancer Neg Hx       Outpatient Encounter Medications as of 04/12/2020  Medication Sig  . albuterol (PROVENTIL) (2.5 MG/3ML) 0.083% nebulizer solution Take 3 mLs (2.5 mg total) by nebulization every 6 (six) hours as needed for wheezing.  [name] albuterol (VENTOLIN HFA) 108 (90 Base) MCG/ACT inhaler Inhale 2 puffs into the lungs every 6 (six) hours as needed for wheezing or shortness of breath.  [name] apixaban (ELIQUIS) 5 MG TABS tablet Take 1 tablet (5 mg total) by mouth 2 (two) times daily. On 08/12/17 @ 9PM, start 1 tab (5 mg) two times daily.  [name] atorvastatin (LIPITOR) 80 MG tablet Take 1 tablet (80 mg total) by mouth daily.  . Blood Glucose Monitoring Suppl (ACCU-CHEK GUIDE ME) w/Device KIT 1 Piece by Does not apply route as directed.  [name] esomeprazole (NEXIUM) 40 MG capsule Take 1 capsule (40 mg total) by mouth 2 (two) times daily before a meal.  . Ferrous Gluconate-C-Folic Acid (IRON-C PO) Take by mouth daily.   . fluconazole (DIFLUCAN) 200 MG tablet Take 1 tablet by mouth after you finish antibiotics.  . furosemide (LASIX) 40 MG tablet Take 1 tablet (40 mg total) by mouth daily.  [name] glipiZIDE (GLUCOTROL XL) 5 MG 24 hr tablet Take 1 tablet (5 mg total) by mouth daily with breakfast.  . glucose blood (ACCU-CHEK GUIDE) test strip Use as instructed  . HYDROcodone-acetaminophen (NORCO/VICODIN) 5-325 MG tablet One tablet every six hours for pain.  Limit 7 days.  . hydrocortisone (ANUSOL-HC) 2.5 % rectal cream Place 1 application rectally 2 (two) times daily.  . insulin NPH-regular Human (NOVOLIN 70/30 RELION) (70-30) 100 UNIT/ML injection Inject 50 Units into the skin 2 (two) times daily with a meal.  . linaclotide (LINZESS) 290 MCG CAPS capsule Take 1 capsule (290 mcg total) by mouth daily before breakfast.  . lisinopril (ZESTRIL) 40 MG tablet Take 1 tablet (40 mg total) by mouth daily.  . metFORMIN (GLUCOPHAGE) 1000 MG tablet Take 1 tablet  (1,000 mg total) by mouth 2 (two) times daily with a meal.  . methocarbamol (ROBAXIN) 500 MG tablet Take 1 tablet (500 mg total) by mouth 2 (two) times daily.  . montelukast (SINGULAIR) 10 MG tablet Take 1 tablet (10 mg total) by mouth at bedtime.  . naproxen (NAPROSYN) 500 MG tablet Take 1 tablet (500 mg total) by mouth 2 (two) times daily.  . ondansetron (ZOFRAN ODT) 4 MG disintegrating tablet Take 4 mg by mouth as needed for nausea or vomiting.   . potassium chloride (KLOR-CON) 10 MEQ tablet Take 1 tablet (10 mEq total) by mouth every evening.  . silver sulfADIAZINE (SILVADENE) 1 % cream Apply 1 application topically daily. (Patient taking differently: Apply 1 application topically as needed. )  . varenicline (CHANTIX CONTINUING MONTH PAK) 1 MG tablet Take 1 tablet (1 mg total) by mouth 2 (two) times daily.  . Vitamin D, Ergocalciferol, (DRISDOL) 1.25 MG (50000 UNIT) CAPS capsule Take 1 capsule (50,000 Units total) by mouth every 7 (seven) days.  . [DISCONTINUED] glipiZIDE (GLUCOTROL XL) 5 MG 24 hr tablet Take 1 tablet by mouth once daily with breakfast  . [DISCONTINUED] insulin NPH-regular Human (NOVOLIN 70/30 RELION) (70-30) 100 UNIT/ML injection Inject 60 Units into the skin 2 (two) times daily with a meal. Inject 50 units into skin three times daily (breakfast, supper, bedtime)  . [DISCONTINUED] metFORMIN (GLUCOPHAGE) 1000 MG tablet Take 1 tablet (1,000 mg total) by mouth 2 (two) times daily with a meal.   No facility-administered encounter medications on file as of 04/12/2020.  ALLERGIES: No Known Allergies  VACCINATION STATUS: Immunization History  Administered Date(s) Administered  . Influenza,inj,Quad PF,6+ Mos 07/04/2014, 05/22/2019  . Moderna SARS-COVID-2 Vaccination 12/04/2019, 12/27/2019  . Pneumococcal Polysaccharide-23 07/04/2014  . Tdap 01/29/2020    Diabetes She presents for her follow-up diabetic visit. She has type 2 diabetes mellitus. Onset time: She was diagnosed  at approximate age of 39 years. Her disease course has been worsening. There are no hypoglycemic associated symptoms. Pertinent negatives for hypoglycemia include no confusion, headaches, pallor or seizures. Associated symptoms include fatigue, polydipsia and polyuria. Pertinent negatives for diabetes include no chest pain and no polyphagia. There are no hypoglycemic complications. Symptoms are worsening. Diabetic complications include peripheral neuropathy and PVD. Risk factors for coronary artery disease include diabetes mellitus, dyslipidemia, family history, obesity, hypertension, tobacco exposure, sedentary lifestyle and post-menopausal. Current diabetic treatment includes insulin injections and oral agent (monotherapy) (She is currently on Novolin 70/30 100 units 3 times a day, Jardiance 25 mg daily, Metformin 1000 mg p.o. twice daily.). Her weight is increasing steadily. She is following a generally unhealthy diet. When asked about meal planning, she reported none. She has not had a previous visit with a dietitian. She never participates in exercise. (She did not bring any logs nor meter to review with her today.  Her recent point-of-care A1c was 8.9%.  She denies hypoglycemia. ) An ACE inhibitor/angiotensin II receptor blocker is being taken.  Hyperlipidemia This is a chronic problem. The current episode started more than 1 year ago. The problem is uncontrolled. Exacerbating diseases include diabetes and obesity. Pertinent negatives include no chest pain, myalgias or shortness of breath. Current antihyperlipidemic treatment includes statins. Risk factors for coronary artery disease include diabetes mellitus, dyslipidemia, hypertension, family history, obesity, a sedentary lifestyle and post-menopausal.  Hypertension This is a chronic problem. The current episode started more than 1 year ago. The problem is uncontrolled. Pertinent negatives include no chest pain, headaches, palpitations or shortness of  breath. Risk factors for coronary artery disease include diabetes mellitus, dyslipidemia, family history, obesity, post-menopausal state, sedentary lifestyle and smoking/tobacco exposure. Hypertensive end-organ damage includes PVD.     Review of Systems  Constitutional: Positive for fatigue. Negative for chills, fever and unexpected weight change.  HENT: Negative for trouble swallowing and voice change.   Eyes: Negative for visual disturbance.  Respiratory: Negative for cough, shortness of breath and wheezing.   Cardiovascular: Negative for chest pain, palpitations and leg swelling.  Gastrointestinal: Negative for diarrhea, nausea and vomiting.  Endocrine: Positive for polydipsia and polyuria. Negative for cold intolerance, heat intolerance and polyphagia.  Musculoskeletal: Negative for arthralgias and myalgias.  Skin: Negative for color change, pallor, rash and wound.  Neurological: Negative for seizures and headaches.  Psychiatric/Behavioral: Negative for confusion and suicidal ideas.    Objective:    Vitals with BMI 04/12/2020 03/25/2020 01/29/2020  Height 5\' 5"  5\' 5"  -  Weight 242 lbs 238 lbs 2 oz -  BMI 82.42 35.36 -  Systolic 144 315 400  Diastolic 88 82 77  Pulse 64 69 83    BP (!) 146/88   Pulse 64   Ht 5\' 5"  (1.651 m)   Wt (!) 242 lb (109.8 kg)   LMP 09/20/2015 (Exact Date)   BMI 40.27 kg/m   Wt Readings from Last 3 Encounters:  04/12/20 (!) 242 lb (109.8 kg)  03/25/20 238 lb 1.9 oz (108 kg)  01/29/20 230 lb (104.3 kg)     Physical Exam Constitutional:      Appearance:  She is well-developed.  HENT:     Head: Normocephalic and atraumatic.  Neck:     Thyroid: No thyromegaly.     Trachea: No tracheal deviation.  Cardiovascular:     Rate and Rhythm: Normal rate and regular rhythm.     Comments: Diminished pulses on dorsalis pedis and posterior tibial arteries. Pulmonary:     Effort: Pulmonary effort is normal.  Abdominal:     Tenderness: There is no abdominal  tenderness. There is no guarding.  Musculoskeletal:        General: Normal range of motion.     Cervical back: Normal range of motion and neck supple.  Skin:    General: Skin is warm and dry.     Coloration: Skin is not pale.     Findings: No erythema or rash.     Comments: Diffuse tattoos.  Neurological:     Mental Status: She is alert and oriented to person, place, and time.     Cranial Nerves: No cranial nerve deficit.     Coordination: Coordination normal.     Deep Tendon Reflexes: Reflexes are normal and symmetric.     Comments: Diminished monofilament testing on bilateral lower extremities.  Psychiatric:        Judgment: Judgment normal.      CMP ( most recent) CMP     Component Value Date/Time   NA 141 12/17/2019 0946   K 4.2 12/17/2019 0946   CL 106 12/17/2019 0946   CO2 29 12/17/2019 0946   GLUCOSE 171 (H) 12/17/2019 0946   BUN 7 12/17/2019 0946   CREATININE 0.83 12/17/2019 0946   CALCIUM 10.1 12/17/2019 0946   PROT 7.2 12/17/2019 0946   ALBUMIN 4.0 08/27/2019 1558   AST 7 (L) 12/17/2019 0946   ALT 11 12/17/2019 0946   ALKPHOS 92 08/27/2019 1558   BILITOT 0.5 12/17/2019 0946   GFRNONAA 80 12/17/2019 0946   GFRAA 93 12/17/2019 0946     Diabetic Labs (most recent): Lab Results  Component Value Date   HGBA1C 8.9 (A) 01/20/2020   HGBA1C 9.7 (H) 08/27/2019   HGBA1C 11.4 (A) 05/08/2019     Lipid Panel ( most recent) Lipid Panel     Component Value Date/Time   CHOL 236 (H) 12/17/2019 0946   TRIG 148 12/17/2019 0946   HDL 36 (L) 12/17/2019 0946   CHOLHDL 6.6 (H) 12/17/2019 0946   VLDL 48 (H) 08/27/2019 1559   LDLCALC 171 (H) 12/17/2019 0946      Lab Results  Component Value Date   TSH 2.507 08/27/2019   TSH 0.641 05/26/2013   FREET4 0.99 05/26/2013      Assessment & Plan:   1. Type 2 diabetes mellitus with other specified complication, with long-term current use of insulin (HCC)   - Sonya James has currently uncontrolled symptomatic  type 2 DM since  54 years of age. She did not bring any logs nor meter to review with her today.  Her recent point-of-care A1c was 8.9%.  She denies hypoglycemia.  Her recent labs were discussed and reviewed with her. - I had a long discussion with her about the progressive nature of diabetes and the pathology behind its complications. -Patient is alarmingly noncompliant for monitoring and documenting care. -her diabetes is complicated by peripheral neuropathy, peripheral arterial disease, chronic heavy smoking, obesity/sedentary life and she remains at a high risk for more acute and chronic complications which include CAD, CVA, CKD, retinopathy, and neuropathy. These are all discussed  in detail with her.  - I have counseled her on diet  and weight management  by adopting a carbohydrate restricted/protein rich diet. Patient is encouraged to switch to  unprocessed or minimally processed     complex starch and increased protein intake (animal or plant source), fruits, and vegetables. -  she is advised to stick to a routine mealtimes to eat 3 meals  a day and avoid unnecessary snacks ( to snack only to correct hypoglycemia).   - she  admits there is a room for improvement in her diet and drink choices. -  Suggestion is made for her to avoid simple carbohydrates  from her diet including Cakes, Sweet Desserts / Pastries, Ice Cream, Soda (diet and regular), Sweet Tea, Candies, Chips, Cookies, Sweet Pastries,  Store Bought Juices, Alcohol in Excess of  1-2 drinks a day, Artificial Sweeteners, Coffee Creamer, and "Sugar-free" Products. This will help patient to have stable blood glucose profile and potentially avoid unintended weight gain.   - she will be scheduled with Jearld Fenton, RDN, CDE for diabetes education.  - I have approached her with the following individualized plan to manage  her diabetes and patient agrees:   - she will benefit from staying on her premixed insulin for simplicity reasons.   -She is approached to start strict monitoring of blood glucose 4 times a day-daily before meals and at bedtime.  -Since she has not been monitoring blood glucose, it is impossible to give her a safe adjustment in her dose of insulin.   -She is advised to lower her Novolin 70/30 to 50 units with breakfast and 50 units with supper  only when her Premeal readings are greater than 90 mg per DL. - she is warned not to take insulin without proper monitoring per orders.  - she is encouraged to call clinic for blood glucose levels less than 70 or above 300 mg /dl. - she is advised to continue Metformin 1000 mg p.o. twice daily, therapeutically suitable for patient . -I discussed and added glipizide 5 mg XL p.o. daily at breakfast.  -She is too high risk to consider incretin therapy due to her severe dyslipidemia and heavy chronic smoking-high risk for pancreatitis.  - Specific targets for  A1c;  LDL, HDL,  and Triglycerides were discussed with the patient.  2) Blood Pressure /Hypertension:  Her blood pressure is not  controlled to target.  She is advised to be consistent with her medications including lisinopril 40 mg p.o. daily.  She also has Lasix as needed. 3) Lipids/Hyperlipidemia:   Review of her recent lipid panel showed uncontrolled  LDL at 171 .  She is advised to continue Lipitor 80 mg p.o. nightly.   Side effects and precautions discussed with her.  4)  Weight/Diet:  Body mass index is 40.27 kg/m.  -   clearly complicating her diabetes care.   she is  a candidate for weight loss. I discussed with her the fact that loss of 5 - 10% of her  current body weight will have the most impact on her diabetes management.  Exercise, and detailed carbohydrates information provided  -  detailed on discharge instructions.  5) Chronic Care/Health Maintenance:  -she  is on ACEI/ARB and Statin medications and  is encouraged to initiate and continue to follow up with Ophthalmology, Dentist,  Podiatrist at  least yearly or according to recommendations, and advised to  quit smoking. I have recommended yearly flu vaccine and pneumonia vaccine at least every 5  years; moderate intensity exercise for up to 150 minutes weekly; and  sleep for at least 7 hours a day.  Her foot exam today is significant for bilateral peripheral neuropathy, bilateral peripheral arterial disease.  The patient was counseled on the dangers of tobacco use, and was advised to quit.  Reviewed strategies to maximize success, including removing cigarettes and smoking materials from environment.   - she is  advised to maintain close follow up with Perlie Mayo, NP for primary care needs, as well as her other providers for optimal and coordinated care.   - Time spent on this patient care encounter:  45 min, of which > 50% was spent in  counseling and the rest reviewing her blood glucose logs , discussing her hypoglycemia and hyperglycemia episodes, reviewing her current and  previous labs / studies  ( including abstraction from other facilities) and medications  doses and developing a  long term treatment plan and documenting her care.   Please refer to Patient Instructions for Blood Glucose Monitoring and Insulin/Medications Dosing Guide"  in media tab for additional information. Please  also refer to " Patient Self Inventory" in the Media  tab for reviewed elements of pertinent patient history.  Lenore Cordia participated in the discussions, expressed understanding, and voiced agreement with the above plans.  All questions were answered to her satisfaction. she is encouraged to contact clinic should she have any questions or concerns prior to her return visit.   Follow up plan: - Return in about 9 weeks (around 06/14/2020) for F/U with Pre-visit Labs, Meter, Logs, A1c here.Glade Lloyd, MD [facility] [address] [place], [place] [address] Phone: [phone]  Fax:  [phone]    04/12/2020, 1:08 PM  This note was partially dictated with voice recognition software. Similar sounding words can be transcribed inadequately or may not  be corrected upon review.

## 2020-04-12 NOTE — Patient Instructions (Signed)

## 2020-04-19 ENCOUNTER — Other Ambulatory Visit (HOSPITAL_COMMUNITY)
Admission: RE | Admit: 2020-04-19 | Discharge: 2020-04-19 | Disposition: A | Discharge status: Home / Self Care | Payer: Medicaid Other | Source: Ambulatory Visit | Attending: "Endocrinology | Admitting: "Endocrinology

## 2020-04-19 DIAGNOSIS — E1169 Type 2 diabetes mellitus with other specified complication: Secondary | ICD-10-CM | POA: Diagnosis not present

## 2020-04-19 LAB — COMPREHENSIVE METABOLIC PANEL
ALT: 19 U/L (ref 0–44)
AST: 21 U/L (ref 15–41)
Albumin: 4.4 g/dL (ref 3.5–5.0)
Alkaline Phosphatase: 118 U/L (ref 38–126)
Anion gap: 9 (ref 5–15)
BUN: 9 mg/dL (ref 6–20)
CO2: 24 mmol/L (ref 22–32)
Calcium: 10.4 mg/dL — ABNORMAL HIGH (ref 8.9–10.3)
Chloride: 106 mmol/L (ref 98–111)
Creatinine, Ser: 0.86 mg/dL (ref 0.44–1.00)
GFR calc Af Amer: >60 mL/min (ref >60)
GFR calc non Af Amer: >60 mL/min (ref >60)
Glucose, Bld: 180 mg/dL — ABNORMAL HIGH (ref 70–99)
Potassium: 3.9 mmol/L (ref 3.5–5.1)
Sodium: 139 mmol/L (ref 135–145)
Total Bilirubin: 0.8 mg/dL (ref 0.3–1.2)
Total Protein: 8.5 g/dL — ABNORMAL HIGH (ref 6.5–8.1)

## 2020-04-19 LAB — LIPID PANEL
Cholesterol: 210 mg/dL — ABNORMAL HIGH (ref 0–200)
HDL: 40 mg/dL — ABNORMAL LOW (ref >40)
LDL Cholesterol: 151 mg/dL — ABNORMAL HIGH (ref 0–99)
Total CHOL/HDL Ratio: 5.3 RATIO
Triglycerides: 96 mg/dL (ref <150)
VLDL: 19 mg/dL (ref 0–40)

## 2020-04-19 LAB — T4, FREE: Free T4: 0.87 ng/dL (ref 0.61–1.12)

## 2020-04-19 LAB — TSH: TSH: 1.426 u[IU]/mL (ref 0.350–4.500)

## 2020-04-24 ENCOUNTER — Other Ambulatory Visit: Payer: Self-pay | Admitting: Family Medicine

## 2020-04-24 ENCOUNTER — Other Ambulatory Visit: Payer: Self-pay | Admitting: "Endocrinology

## 2020-04-24 DIAGNOSIS — Z9119 Patient's noncompliance with other medical treatment and regimen: Secondary | ICD-10-CM

## 2020-04-24 DIAGNOSIS — Z91199 Patient's noncompliance with other medical treatment and regimen due to unspecified reason: Secondary | ICD-10-CM

## 2020-05-04 ENCOUNTER — Ambulatory Visit: Payer: Medicaid Other | Admitting: Orthopaedic Surgery

## 2020-05-04 ENCOUNTER — Ambulatory Visit: Payer: Self-pay | Admitting: Nutrition

## 2020-05-11 ENCOUNTER — Other Ambulatory Visit: Payer: Self-pay

## 2020-05-11 ENCOUNTER — Ambulatory Visit (INDEPENDENT_AMBULATORY_CARE_PROVIDER_SITE_OTHER): Payer: Medicaid Other | Admitting: Orthopaedic Surgery

## 2020-05-11 ENCOUNTER — Encounter: Payer: Self-pay | Admitting: Orthopaedic Surgery

## 2020-05-11 VITALS — BP 139/88 | HR 86 | Ht 65.0 in | Wt 242.0 lb

## 2020-05-11 DIAGNOSIS — M25561 Pain in right knee: Secondary | ICD-10-CM

## 2020-05-11 DIAGNOSIS — G8929 Other chronic pain: Secondary | ICD-10-CM

## 2020-05-11 DIAGNOSIS — M25512 Pain in left shoulder: Secondary | ICD-10-CM

## 2020-05-11 DIAGNOSIS — F17210 Nicotine dependence, cigarettes, uncomplicated: Secondary | ICD-10-CM

## 2020-05-11 NOTE — Progress Notes (Signed)
MRI

## 2020-05-11 NOTE — Progress Notes (Signed)
PROCEDURE NOTE:  The patient request injection, verbal consent was obtained.  The left shoulder was prepped appropriately after time out was performed.   Sterile technique was observed and injection of 1 cc of Depo-Medrol 40 mg with several cc's of plain xylocaine. Anesthesia was provided by ethyl chloride and a 20-gauge needle was used to inject the shoulder area. A posterior approach was used.  The injection was tolerated well.  A band aid dressing was applied.  The patient was advised to apply ice later today and tomorrow to the injection sight as needed.  PROCEDURE NOTE:  The patient requests injections of the right knee , verbal consent was obtained.  The right knee was prepped appropriately after time out was performed.   Sterile technique was observed and injection of 1 cc of Depo-Medrol 40 mg with several cc's of plain xylocaine. Anesthesia was provided by ethyl chloride and a 20-gauge needle was used to inject the knee area. The injection was tolerated well.  A band aid dressing was applied.  The patient was advised to apply ice later today and tomorrow to the injection sight as needed.  Her shoulder pain is worse.  I will get MRI.  Return in one month.   Electronically Signed Sanjuana Kava, MD 8/31/20219:13 AM

## 2020-05-26 ENCOUNTER — Other Ambulatory Visit: Payer: Self-pay

## 2020-05-26 ENCOUNTER — Ambulatory Visit (HOSPITAL_COMMUNITY)
Admission: RE | Admit: 2020-05-26 | Discharge: 2020-05-26 | Disposition: A | Discharge status: Home / Self Care | Payer: Medicaid Other | Source: Ambulatory Visit | Attending: Orthopaedic Surgery | Admitting: Orthopaedic Surgery

## 2020-05-26 DIAGNOSIS — M25512 Pain in left shoulder: Secondary | ICD-10-CM | POA: Diagnosis present

## 2020-05-27 ENCOUNTER — Ambulatory Visit (INDEPENDENT_AMBULATORY_CARE_PROVIDER_SITE_OTHER): Payer: Medicaid Other | Admitting: Gastroenterology

## 2020-05-27 ENCOUNTER — Encounter: Payer: Self-pay | Admitting: Gastroenterology

## 2020-05-27 VITALS — BP 151/87 | HR 58 | Temp 97.1°F | Ht 65.0 in | Wt 238.8 lb

## 2020-05-27 DIAGNOSIS — K5900 Constipation, unspecified: Secondary | ICD-10-CM | POA: Diagnosis not present

## 2020-05-27 DIAGNOSIS — K219 Gastro-esophageal reflux disease without esophagitis: Secondary | ICD-10-CM

## 2020-05-27 MED ORDER — LINACLOTIDE 290 MCG PO CAPS: 290.0000 ug | ORAL_CAPSULE | Freq: Every day | ORAL | 3 refills | Status: DC

## 2020-05-27 MED ORDER — HYDROCORTISONE (PERIANAL) 2.5 % EX CREA: 1.0000 "application " | TOPICAL_CREAM | Freq: Two times a day (BID) | CUTANEOUS | 1 refills | Status: DC

## 2020-05-27 MED ORDER — ESOMEPRAZOLE MAGNESIUM 40 MG PO CPDR: 40.0000 mg | DELAYED_RELEASE_CAPSULE | Freq: Two times a day (BID) | ORAL | 3 refills | Status: DC

## 2020-05-27 NOTE — Progress Notes (Signed)
Referring Provider: Maryruth Hancock, MD Primary Care Physician:  Fayrene Helper, MD Primary GI: Dr. Abbey Chatters  Chief Complaint  Patient presents with  . Follow-up    Gerd,Needs Linzess RX and rectal cream    HPI:   Sonya James is a 54 y.o. female presenting today with a history of constipation, dysphagia, and GERD. Remote history of pancreatitis in 2013 (CT with possible findings, lipase mildly elevated 111). CT with contrast May 2019 without any abnormalities. Did note mild hepatic steatosis. Last colonoscopy in 2018 with hyperplastic polyps.   Constipation: doing well on Linzess 290 mcg daily. Needs refill. Some rectal itching. No overt GI bleeding. GERD well-controlled on Nexium BID. Reports her ear had a place that "popped" and now draining.   Past Medical History:  Diagnosis Date  . Anemia   . Anxiety   . Arthritis   . Asthma   . Asthma 05/25/2013  . Autonomic neuropathy   . Cancer of endometrium ([place]) 10/18/2015  . Constipation   . COPD (chronic obstructive pulmonary disease) ([place])   . CTS (carpal tunnel syndrome)   . Depression   . Diabetes mellitus without complication ([place])   . Dysphagia 12/14/2016  . Encounter for screening colonoscopy   . Endometrial cancer ([place]) 2017  . Gastritis without bleeding   . GERD (gastroesophageal reflux disease)   . Gross hematuria 05/11/2019  . Headache(784.0)   . Heavy menses 10/11/2015  . HTN (hypertension)   . Hypercholesterolemia   . Hypothyroidism   . IBS (irritable bowel syndrome)   . Osteoarthritis    osteoarthritis  . Pancreatitis    per patient   . Recurrent boils    buttocks and low back.  . S/P laparoscopic hysterectomy 11/04/2015  . Shortness of breath   . Sleep apnea    CPAP machine  . Uncontrolled type 2 diabetes mellitus with diabetic polyneuropathy, with long-term current use of insulin ([place]) 08/04/2017  . Uterine cancer ([place]) 2017  . Vitamin D deficiency     Past Surgical History:  Procedure Laterality  Date  . ABDOMINAL HYSTERECTOMY  2018   endometrial cancer, surgery done at [facility]   . BIOPSY  01/02/2017   Procedure: BIOPSY;  Surgeon: Danie Binder, MD;  [facility];  Service: Endoscopy;;  gastric biopsy  . CESAREAN SECTION  1989  . COLONOSCOPY WITH PROPOFOL N/A 01/02/2017   Dr. Oneida Alar: redundant colon, two 2-3 mm polyps in sigmoid colon (hyperplastic)  . ESOPHAGOGASTRODUODENOSCOPY  05/22/2011   Dr. Oneida Alar: H.pylori gastritis   . ESOPHAGOGASTRODUODENOSCOPY (EGD) WITH PROPOFOL N/A 01/02/2017   Dr. 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 FOOT SURGERY Left    tendon repair  . OOPHORECTOMY    . POLYPECTOMY  01/02/2017   Procedure: POLYPECTOMY;  Surgeon: Danie Binder, MD;  [facility];  Service: Endoscopy;;  sigmoid colon polyps times 2  . REDUCTION MAMMAPLASTY  1996  . SAVORY DILATION N/A 01/02/2017   Procedure: SAVORY DILATION;  Surgeon: Danie Binder, MD;  Location: [facility];  Service: Endoscopy;  Laterality: N/A;  . TUBAL LIGATION      Current Outpatient Medications  Medication Sig Dispense Refill  . albuterol (PROVENTIL) (2.5 MG/3ML) 0.083% nebulizer solution Take 3 mLs (2.5 mg total) by nebulization every 6 (six) hours as needed for wheezing. 75 mL 12  . albuterol (VENTOLIN HFA) 108 (90 Base) MCG/ACT inhaler Inhale 2 puffs into the lungs  every 6 (six) hours as needed for wheezing or shortness of breath. 18 g 5  . apixaban (ELIQUIS) 5 MG TABS tablet Take 1 tablet (5 mg total) by mouth 2 (two) times daily. On 08/12/17 @ 9PM, start 1 tab (5 mg) two times daily. 60 tablet 5  . atorvastatin (LIPITOR) 80 MG tablet Take 1 tablet (80 mg total) by mouth daily. 90 tablet 0  . Blood Glucose Monitoring Suppl (ACCU-CHEK GUIDE ME) w/Device KIT 1 Piece by Does not apply route as directed. 1 kit 0  . esomeprazole (NEXIUM) 40 MG capsule Take 1 capsule (40 mg total) by mouth 2 (two) times daily before a meal.  180 capsule 3  . glipiZIDE (GLUCOTROL XL) 5 MG 24 hr tablet Take 1 tablet by mouth once daily with breakfast 90 tablet 0  . glucose blood (ACCU-CHEK GUIDE) test strip Use as instructed 150 each 2  . hydrocortisone (ANUSOL-HC) 2.5 % rectal cream Place 1 application rectally 2 (two) times daily. As needed for itching 30 g 1  . insulin NPH-regular Human (NOVOLIN 70/30 RELION) (70-30) 100 UNIT/ML injection Inject 50 Units into the skin 2 (two) times daily with a meal. 30 mL 2  . linaclotide (LINZESS) 290 MCG CAPS capsule Take 1 capsule (290 mcg total) by mouth daily before breakfast. 90 capsule 3  . lisinopril (ZESTRIL) 40 MG tablet Take 1 tablet (40 mg total) by mouth daily. 30 tablet 1  . methocarbamol (ROBAXIN) 500 MG tablet Take 1 tablet (500 mg total) by mouth 2 (two) times daily. 20 tablet 0  . montelukast (SINGULAIR) 10 MG tablet Take 1 tablet (10 mg total) by mouth at bedtime. 30 tablet 5  . ondansetron (ZOFRAN ODT) 4 MG disintegrating tablet Take 4 mg by mouth as needed for nausea or vomiting.     . potassium chloride (KLOR-CON) 10 MEQ tablet Take 1 tablet (10 mEq total) by mouth every evening. 30 tablet 5  . silver sulfADIAZINE (SILVADENE) 1 % cream Apply 1 application topically daily. (Patient taking differently: Apply 1 application topically as needed. ) 50 g 0  . Vitamin D, Ergocalciferol, (DRISDOL) 1.25 MG (50000 UNIT) CAPS capsule Take 1 capsule (50,000 Units total) by mouth every 7 (seven) days. 4 capsule 2   No current facility-administered medications for this visit.    Allergies as of 05/27/2020  . (No Known Allergies)    Family History  Problem Relation Age of Onset  . Diabetes Mother   . Hypertension Mother   . COPD Mother   . Asthma Mother   . Hypercholesterolemia Mother   . Hypertension Sister   . Hyperlipidemia Sister   . Diabetes Sister   . Asthma Brother   . Alcohol abuse Brother   . Asthma Son   . Cancer Maternal Grandmother        lung, throat, breast  .  Alzheimer's disease Maternal Grandmother   . Hypertension Maternal Grandmother   . Hypercholesterolemia Maternal Grandmother   . Heart disease Maternal Grandfather   . Stroke Maternal Grandfather   . Clotting disorder Maternal Grandfather        blood clots in legs  . Hypertension Sister   . Hyperlipidemia Sister   . Stroke Maternal Aunt   . Clotting disorder Maternal Aunt        blood clots in legs  . Hypertension Maternal Aunt   . Hypercholesterolemia Maternal Aunt   . Hypertension Maternal Aunt   . Asthma Maternal Aunt   . Clotting disorder Maternal  Uncle        blood clot -legs traveled to heart and he died of heart attack  . Colon cancer Neg Hx     Social History   Socioeconomic History  . Marital status: Married    Spouse name: Not on file  . Number of children: 1  . Years of education: Not on file  . Highest education level: Not on file  Occupational History  . Not on file  Tobacco Use  . Smoking status: Current Some Day Smoker    Packs/day: 0.50    Years: 22.00    Pack years: 11.00    Types: Cigarettes  . Smokeless tobacco: Never Used  Vaping Use  . Vaping Use: Never used  Substance and Sexual Activity  . Alcohol use: No  . Drug use: No  . Sexual activity: Yes    Birth control/protection: Surgical    Comment: hyst  Other Topics Concern  . Not on file  Social History Narrative   Lives with husband and she has been married to for 9 years.  Recently got evicted and has been living in a motel since last July.   She has 1 son that lives in [place].  Reports one grandbaby      Enjoys watching TV mostly animal [place].      Diet: Eats all food groups   Caffeine: Soda not diet, green tea, coffee about 3 cups of caffeine per day.   Water: Drinks 5-6 8 ounce bottles throughout the day.      Wears a seatbelt.  Currently does not have a vehicle.  Smoke detectors in the facility she is living in.  Does not have any weapons.   Social Determinants of Health    Financial Resource Strain: Low Risk   . Difficulty of Paying Living Expenses: Not hard at all  Food Insecurity: No Food Insecurity  . Worried About Charity fundraiser in the Last Year: Never true  . Ran Out of Food in the Last Year: Never true  Transportation Needs: Unmet Transportation Needs  . Lack of Transportation (Medical): Yes  . Lack of Transportation (Non-Medical): Yes  Physical Activity: Insufficiently Active  . Days of Exercise per Week: 3 days  . Minutes of Exercise per Session: 20 min  Stress: No Stress Concern Present  . Feeling of Stress : Not at all  Social Connections: Moderately Isolated  . Frequency of Communication with Friends and Family: More than three times a week  . Frequency of Social Gatherings with Friends and Family: More than three times a week  . Attends Religious Services: 1 to 4 times per year  . Active Member of Clubs or Organizations: No  . Attends Archivist Meetings: Never  . Marital Status: Separated    Review of Systems: Gen: Denies fever, chills, anorexia. Denies fatigue, weakness, weight loss.  CV: Denies chest pain, palpitations, syncope, peripheral edema, and claudication. Resp: Denies dyspnea at rest, cough, wheezing, coughing up blood, and pleurisy. GI: see HPI Derm: Denies rash, itching, dry skin Psych: Denies depression, anxiety, memory loss, confusion. No homicidal or suicidal ideation.  Heme: Denies bruising, bleeding, and enlarged lymph nodes.  Physical Exam: BP (!) 151/87   Pulse (!) 58   Temp (!) 97.1 F (36.2 C) (Oral)   Ht _0  (1.651 m)   Wt 238 lb 12.8 oz (108.3 kg)   LMP 09/20/2015 (Exact Date)   BMI 39.74 kg/m  General:   Alert and oriented. No  distress noted. Pleasant and cooperative.  Head:  Normocephalic and atraumatic. Eyes:  Conjuctiva clear without scleral icterus. Ears: left ear with small raised area just behind the tragus without erythema, small purulent drainage expressed. No bleeding.   Mouth:  Mask in place Abdomen:  +BS, soft, non-tender and non-distended. No rebound or guarding. No HSM or masses noted. Msk:  Symmetrical without gross deformities. Normal posture. Extremities:  Without edema. Neurologic:  Alert and  oriented x4 Psych:  Alert and cooperative. Normal mood and affect.  ASSESSMENT/PLAN: Sonya James is a 54 y.o. female presenting today with history of chronic constipation, GERD, fatty liver, for routine follow-up.  Linzess 290 mcg continues to work well for constipation. GERD well-managed with Nexium  BID.   Small area left area behind tragus that appears to be similar to a whitehead that was expressed by patient just before me entering. No alarm features. Apply warm compresses and monitor for worsening.   Will have her return in 6 months. Refills provided.   Annitta Needs, PhD, ANP-BC [facility]

## 2020-05-27 NOTE — Patient Instructions (Addendum)
I have refilled the rectal cream, Linzess, and Nexium.    Please call if any rectal bleeding!  We will see you in 6 months!  I enjoyed seeing you again today! As you know, I value our relationship and want to provide genuine, compassionate, and quality care. I welcome your feedback. If you receive a survey regarding your visit,  I greatly appreciate you taking time to fill this out. See you next time!  Annitta Needs, PhD, ANP-BC [facility]

## 2020-05-27 NOTE — Progress Notes (Signed)
CC'ED TO PCP 

## 2020-05-28 ENCOUNTER — Other Ambulatory Visit: Payer: Self-pay | Admitting: Family Medicine

## 2020-06-04 ENCOUNTER — Other Ambulatory Visit: Payer: Self-pay

## 2020-06-04 ENCOUNTER — Other Ambulatory Visit: Payer: Self-pay | Admitting: "Endocrinology

## 2020-06-04 DIAGNOSIS — E1169 Type 2 diabetes mellitus with other specified complication: Secondary | ICD-10-CM

## 2020-06-04 MED ORDER — METFORMIN HCL 1000 MG PO TABS: 1000.0000 mg | ORAL_TABLET | Freq: Two times a day (BID) | ORAL | 0 refills | Status: DC

## 2020-06-04 MED ORDER — LISINOPRIL 40 MG PO TABS
40.0000 mg | ORAL_TABLET | Freq: Every day | ORAL | 1 refills | Status: DC
Start: 2020-06-04 — End: 2020-08-30

## 2020-06-04 NOTE — Telephone Encounter (Signed)
Pt states she has been trying to get her metformin 1,000 mg refilled. Please advise  [place], [place] - [place] [address] [place] Phone:  [phone]  Fax:  [phone]

## 2020-06-04 NOTE — Telephone Encounter (Signed)
Rx sent in

## 2020-06-07 ENCOUNTER — Encounter (HOSPITAL_COMMUNITY): Payer: Self-pay | Admitting: Physical Therapy

## 2020-06-07 NOTE — Therapy (Signed)
[place], [place], [address] Phone: [phone]   Fax:  [phone]  Patient Details  Name: Sonya James MRN: [id number] Date of Birth: 07/28/1966 Referring Provider:  No ref. provider found  Encounter Date: 06/07/2020   PHYSICAL THERAPY DISCHARGE SUMMARY  Visits from Start of Care: 7  Current functional level related to goals / functional outcomes: Unknown last visit pt states back and neck were not as tight.    Remaining deficits: Unknown    Education / Equipment: HEP Plan: Patient agrees to discharge.  Patient goals were partially met. Patient is being discharged due to not returning since the last visit.  ?????      Rayetta Humphrey, PT CLT [phone] 06/07/2020, 4:04 PM  [place] [address] [place], [place], [address] Phone: [phone]   Fax:  [phone]

## 2020-06-08 ENCOUNTER — Other Ambulatory Visit: Payer: Self-pay

## 2020-06-08 ENCOUNTER — Other Ambulatory Visit: Payer: Self-pay | Admitting: *Deleted

## 2020-06-08 ENCOUNTER — Ambulatory Visit (INDEPENDENT_AMBULATORY_CARE_PROVIDER_SITE_OTHER): Payer: Medicaid Other | Admitting: Internal Medicine

## 2020-06-08 ENCOUNTER — Encounter: Payer: Self-pay | Admitting: Internal Medicine

## 2020-06-08 VITALS — BP 181/93 | HR 69 | Temp 97.6°F | Resp 18 | Ht 65.0 in | Wt 240.1 lb

## 2020-06-08 DIAGNOSIS — H60502 Unspecified acute noninfective otitis externa, left ear: Secondary | ICD-10-CM | POA: Diagnosis not present

## 2020-06-08 DIAGNOSIS — Z23 Encounter for immunization: Secondary | ICD-10-CM | POA: Diagnosis not present

## 2020-06-08 MED ORDER — MONTELUKAST SODIUM 10 MG PO TABS
10.0000 mg | ORAL_TABLET | Freq: Every day | ORAL | 5 refills | Status: DC
Start: 2020-06-08 — End: 2021-01-05

## 2020-06-08 MED ORDER — OFLOXACIN 0.3 % OT SOLN: 5.0000 [drp] | Freq: Two times a day (BID) | OTIC | 0 refills | Status: DC

## 2020-06-08 MED ORDER — FUROSEMIDE 40 MG PO TABS
40.0000 mg | ORAL_TABLET | Freq: Every day | ORAL | 5 refills | Status: DC
Start: 2020-06-08 — End: 2020-11-22

## 2020-06-08 MED ORDER — VITAMIN D (ERGOCALCIFEROL) 1.25 MG (50000 UNIT) PO CAPS
50000.0000 [IU] | ORAL_CAPSULE | ORAL | 2 refills | Status: DC
Start: 2020-06-08 — End: 2020-11-03

## 2020-06-08 MED ORDER — POTASSIUM CHLORIDE ER 10 MEQ PO TBCR: 10.0000 meq | EXTENDED_RELEASE_TABLET | Freq: Every evening | ORAL | 5 refills | Status: DC

## 2020-06-08 MED ORDER — ATORVASTATIN CALCIUM 80 MG PO TABS
80.0000 mg | ORAL_TABLET | Freq: Every day | ORAL | 0 refills | Status: DC
Start: 2020-06-08 — End: 2020-08-30

## 2020-06-08 NOTE — Patient Instructions (Addendum)
Please apply Antibiotic ear drops as instructed. Please get immediate medical attention if you have fever, bloody discharge or pain in the back of the head.  Please contact us if there is no improvement in symptoms in next 5 days or symptoms worsen.  You are advised not to pick on the ears, use qtips or insert foreign objects in the ears.  Otitis Externa  Otitis externa is an infection of the outer ear canal. The outer ear canal is the area between the outside of the ear and the eardrum. Otitis externa is sometimes called swimmer's ear. What are the causes? Common causes of this condition include:  Swimming in dirty water.  Moisture in the ear.  An injury to the inside of the ear.  An object stuck in the ear.  A cut or scrape on the outside of the ear. How is this treated? This condition may be treated with:  Antibiotic ear drops. These are often given for 10-14 days.  Medicines to reduce itching and swelling. Follow these instructions at home:  If you were given antibiotic ear drops, use them as told by your doctor. Do not stop using them even if your condition gets better.  Take over-the-counter and prescription medicines only as told by your doctor.  Avoid getting water in your ears as told by your doctor. You may be told to avoid swimming or water sports for a few days.  Keep all follow-up visits as told by your doctor. This is important. How is this prevented?  Keep your ears dry. Use the corner of a towel to dry your ears after you swim or bathe.  Try not to scratch or put things in your ear. Doing these things makes it easier for germs to grow in your ear.  Avoid swimming in lakes, dirty water, or pools that may not have the right amount of a chemical called chlorine. Contact a doctor if:  You have a fever.  Your ear is still red, swollen, or painful after 3 days.  You still have pus coming from your ear after 3 days.  Your redness, swelling, or pain gets  worse.  You have a really bad headache.  You have redness, swelling, pain, or tenderness behind your ear. Summary  Otitis externa is an infection of the outer ear canal.  Symptoms include pain, redness, and swelling of the ear.  If you were given antibiotic ear drops, use them as told by your doctor. Do not stop using them even if your condition gets better.  Try not to scratch or put things in your ear. This information is not intended to replace advice given to you by your health care provider. Make sure you discuss any questions you have with your health care provider. Document Revised: 02/01/2018 Document Reviewed: 02/01/2018 Elsevier Patient Education  [place].

## 2020-06-08 NOTE — Progress Notes (Addendum)
Established Patient Office Visit  Subjective:  Patient ID: Sonya James, female    DOB: 11-Jul-1966  Age: 54 y.o. MRN: [id number]  CC:  Chief Complaint  Patient presents with  . Ear Pain    pt has ear pain and swelling also drainage. Pt had blackheads in her ear so she popped them and the drainage and swelling has been getting worse for 3 weeks     HPI Sonya James is a 54 year old female with past medical history of uncontrolled hypertension, diabetes, PE, COPD and obesity presents for evaluation for left ear pain, swelling and discharge.  She states that she had a blackhead in her left ear canal, which she tried to burst.  Patient mentions that she started having discharge after it.  For the last week, she has been having progressive left ear swelling and the discharge has been foul-smelling.  She has been applying Neosporin cream and has to keep cotton plug in the ear to stop the discharge to drain through her ears.  She has been having mild hearing difficulty, mentioning the voice.  She denies fever, chills, runny nose, sinus pressure, throat pain, occipital headache, nausea, vomiting.  Past Medical History:  Diagnosis Date  . Anemia   . Anxiety   . Arthritis   . Asthma   . Asthma 05/25/2013  . Autonomic neuropathy   . Cancer of endometrium ([place]) 10/18/2015  . Constipation   . COPD (chronic obstructive pulmonary disease) ([place])   . CTS (carpal tunnel syndrome)   . Depression   . Diabetes mellitus without complication ([place])   . Dysphagia 12/14/2016  . Encounter for screening colonoscopy   . Endometrial cancer ([place]) 2017  . Gastritis without bleeding   . GERD (gastroesophageal reflux disease)   . Gross hematuria 05/11/2019  . Headache(784.0)   . Heavy menses 10/11/2015  . HTN (hypertension)   . Hypercholesterolemia   . Hypothyroidism   . IBS (irritable bowel syndrome)   . Osteoarthritis    osteoarthritis  . Pancreatitis    per patient   . Recurrent boils    buttocks and low  back.  . S/P laparoscopic hysterectomy 11/04/2015  . Shortness of breath   . Sleep apnea    CPAP machine  . Uncontrolled type 2 diabetes mellitus with diabetic polyneuropathy, with long-term current use of insulin ([place]) 08/04/2017  . Uterine cancer ([place]) 2017  . Vitamin D deficiency     Past Surgical History:  Procedure Laterality Date  . ABDOMINAL HYSTERECTOMY  2018   endometrial cancer, surgery done at [facility]   . BIOPSY  01/02/2017   Procedure: BIOPSY;  Surgeon: Danie Binder, MD;  [facility];  Service: Endoscopy;;  gastric biopsy  . CESAREAN SECTION  1989  . COLONOSCOPY WITH PROPOFOL N/A 01/02/2017   Dr. Oneida Alar: redundant colon, two 2-3 mm polyps in sigmoid colon (hyperplastic)  . ESOPHAGOGASTRODUODENOSCOPY  05/22/2011   Dr. Oneida Alar: H.pylori gastritis   . ESOPHAGOGASTRODUODENOSCOPY (EGD) WITH PROPOFOL N/A 01/02/2017   Dr. 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 FOOT SURGERY Left    tendon repair  . OOPHORECTOMY    . POLYPECTOMY  01/02/2017   Procedure: POLYPECTOMY;  Surgeon: Danie Binder, MD;  [facility];  Service: Endoscopy;;  sigmoid colon polyps times 2  . REDUCTION MAMMAPLASTY  1996  . SAVORY DILATION N/A 01/02/2017   Procedure: SAVORY DILATION;  Surgeon: Danie Binder, MD;  [facility]  [facility];  Service: Endoscopy;  Laterality: N/A;  . TUBAL LIGATION      Family History  Problem Relation Age of Onset  . Diabetes Mother   . Hypertension Mother   . COPD Mother   . Asthma Mother   . Hypercholesterolemia Mother   . Hypertension Sister   . Hyperlipidemia Sister   . Diabetes Sister   . Asthma Brother   . Alcohol abuse Brother   . Asthma Son   . Cancer Maternal Grandmother        lung, throat, breast  . Alzheimer's disease Maternal Grandmother   . Hypertension Maternal Grandmother   . Hypercholesterolemia Maternal Grandmother   . Heart disease Maternal Grandfather   .  Stroke Maternal Grandfather   . Clotting disorder Maternal Grandfather        blood clots in legs  . Hypertension Sister   . Hyperlipidemia Sister   . Stroke Maternal Aunt   . Clotting disorder Maternal Aunt        blood clots in legs  . Hypertension Maternal Aunt   . Hypercholesterolemia Maternal Aunt   . Hypertension Maternal Aunt   . Asthma Maternal Aunt   . Clotting disorder Maternal Uncle        blood clot -legs traveled to heart and he died of heart attack  . Colon cancer Neg Hx     Social History   Socioeconomic History  . Marital status: Married    Spouse name: Not on file  . Number of children: 1  . Years of education: Not on file  . Highest education level: Not on file  Occupational History  . Not on file  Tobacco Use  . Smoking status: Current Some Day Smoker    Packs/day: 0.50    Years: 22.00    Pack years: 11.00    Types: Cigarettes  . Smokeless tobacco: Never Used  Vaping Use  . Vaping Use: Never used  Substance and Sexual Activity  . Alcohol use: No  . Drug use: No  . Sexual activity: Yes    Birth control/protection: Surgical    Comment: hyst  Other Topics Concern  . Not on file  Social History Narrative   Lives with husband and she has been married to for 9 years.  Recently got evicted and has been living in a motel since last July.   She has 1 son that lives in [place].  Reports one grandbaby      Enjoys watching TV mostly animal [place].      Diet: Eats all food groups   Caffeine: Soda not diet, green tea, coffee about 3 cups of caffeine per day.   Water: Drinks 5-6 8 ounce bottles throughout the day.      Wears a seatbelt.  Currently does not have a vehicle.  Smoke detectors in the facility she is living in.  Does not have any weapons.   Social Determinants of Health   Financial Resource Strain: Low Risk   . Difficulty of Paying Living Expenses: Not hard at all  Food Insecurity: No Food Insecurity  . Worried About Charity fundraiser  in the Last Year: Never true  . Ran Out of Food in the Last Year: Never true  Transportation Needs: Unmet Transportation Needs  . Lack of Transportation (Medical): Yes  . Lack of Transportation (Non-Medical): Yes  Physical Activity: Insufficiently Active  . Days of Exercise per Week: 3 days  . Minutes of Exercise per Session: 20  min  Stress: No Stress Concern Present  . Feeling of Stress : Not at all  Social Connections: Moderately Isolated  . Frequency of Communication with Friends and Family: More than three times a week  . Frequency of Social Gatherings with Friends and Family: More than three times a week  . Attends Religious Services: 1 to 4 times per year  . Active Member of Clubs or Organizations: No  . Attends Archivist Meetings: Never  . Marital Status: Separated  Intimate Partner Violence: Not At Risk  . Fear of Current or Ex-Partner: No  . Emotionally Abused: No  . Physically Abused: No  . Sexually Abused: No    Outpatient Medications Prior to Visit  Medication Sig Dispense Refill  . albuterol (PROVENTIL) (2.5 MG/3ML) 0.083% nebulizer solution Take 3 mLs (2.5 mg total) by nebulization every 6 (six) hours as needed for wheezing. 75 mL 12  . albuterol (VENTOLIN HFA) 108 (90 Base) MCG/ACT inhaler Inhale 2 puffs into the lungs every 6 (six) hours as needed for wheezing or shortness of breath. 18 g 5  . apixaban (ELIQUIS) 5 MG TABS tablet Take 1 tablet (5 mg total) by mouth 2 (two) times daily. On 08/12/17 @ 9PM, start 1 tab (5 mg) two times daily. 60 tablet 5  . Blood Glucose Monitoring Suppl (ACCU-CHEK GUIDE ME) w/Device KIT 1 Piece by Does not apply route as directed. 1 kit 0  . esomeprazole (NEXIUM) 40 MG capsule Take 1 capsule (40 mg total) by mouth 2 (two) times daily before a meal. 180 capsule 3  . glipiZIDE (GLUCOTROL XL) 5 MG 24 hr tablet Take 1 tablet by mouth once daily with breakfast 90 tablet 0  . glucose blood (ACCU-CHEK GUIDE) test strip Use as  instructed 150 each 2  . hydrocortisone (ANUSOL-HC) 2.5 % rectal cream Place 1 application rectally 2 (two) times daily. As needed for itching 30 g 1  . insulin NPH-regular Human (NOVOLIN 70/30 RELION) (70-30) 100 UNIT/ML injection Inject 50 Units into the skin 2 (two) times daily with a meal. 30 mL 2  . linaclotide (LINZESS) 290 MCG CAPS capsule Take 1 capsule (290 mcg total) by mouth daily before breakfast. 90 capsule 3  . lisinopril (ZESTRIL) 40 MG tablet Take 1 tablet (40 mg total) by mouth daily. 30 tablet 1  . metFORMIN (GLUCOPHAGE) 1000 MG tablet Take 1 tablet (1,000 mg total) by mouth 2 (two) times daily with a meal. 180 tablet 0  . methocarbamol (ROBAXIN) 500 MG tablet Take 1 tablet (500 mg total) by mouth 2 (two) times daily. 20 tablet 0  . ondansetron (ZOFRAN ODT) 4 MG disintegrating tablet Take 4 mg by mouth as needed for nausea or vomiting.     . silver sulfADIAZINE (SILVADENE) 1 % cream Apply 1 application topically daily. (Patient taking differently: Apply 1 application topically as needed. ) 50 g 0  . atorvastatin (LIPITOR) 80 MG tablet Take 1 tablet (80 mg total) by mouth daily. 90 tablet 0  . montelukast (SINGULAIR) 10 MG tablet Take 1 tablet (10 mg total) by mouth at bedtime. 30 tablet 5  . potassium chloride (KLOR-CON) 10 MEQ tablet Take 1 tablet (10 mEq total) by mouth every evening. 30 tablet 5  . Vitamin D, Ergocalciferol, (DRISDOL) 1.25 MG (50000 UNIT) CAPS capsule Take 1 capsule (50,000 Units total) by mouth every 7 (seven) days. 4 capsule 2   No facility-administered medications prior to visit.    No Known Allergies  ROS Review of  Systems  Constitutional: Negative for chills and fever.  HENT: Positive for ear discharge and ear pain. Negative for congestion, sinus pressure, sinus pain, sore throat, tinnitus, trouble swallowing and voice change.   Eyes: Negative for pain and discharge.  Respiratory: Negative for cough and shortness of breath.   Cardiovascular:  Negative for chest pain and palpitations.  Gastrointestinal: Negative for abdominal pain, constipation, diarrhea, nausea and vomiting.  Endocrine: Negative for polydipsia and polyuria.  Genitourinary: Negative for dysuria and hematuria.  Musculoskeletal: Negative for neck pain and neck stiffness.  Skin: Negative for rash.  Neurological: Negative for dizziness and weakness.  Psychiatric/Behavioral: Negative for agitation and behavioral problems.      Objective:    Physical Exam Vitals reviewed.  Constitutional:      General: She is not in acute distress.    Appearance: She is not diaphoretic.  HENT:     Head: Normocephalic and atraumatic.     Right Ear: External ear normal.     Ears:     Comments: Left tragal tenderness, with foul-smelling discharge noted Mild swelling noted behind the ear No mastoid tenderness    Nose: Nose normal. No congestion.     Mouth/Throat:     Mouth: Mucous membranes are moist.     Pharynx: No posterior oropharyngeal erythema.  Eyes:     General: No scleral icterus.    Extraocular Movements: Extraocular movements intact.     Pupils: Pupils are equal, round, and reactive to light.  Cardiovascular:     Rate and Rhythm: Normal rate and regular rhythm.     Heart sounds: No murmur heard.   Pulmonary:     Breath sounds: Normal breath sounds. No wheezing or rales.  Abdominal:     Palpations: Abdomen is soft.     Tenderness: There is no abdominal tenderness.  Musculoskeletal:     Cervical back: Neck supple. No tenderness.     Right lower leg: No edema.     Left lower leg: No edema.  Skin:    General: Skin is warm.     Findings: No rash.  Neurological:     General: No focal deficit present.     Mental Status: She is alert and oriented to person, place, and time.  Psychiatric:        Mood and Affect: Mood normal.        Behavior: Behavior normal.     BP (!) 181/93 (BP Location: Right Arm, Patient Position: Sitting, Cuff Size: Normal)   Pulse  69   Temp 97.6 F (36.4 C) (Temporal)   Resp 18   Ht _0  (1.651 m)   Wt 240 lb 1.9 oz (108.9 kg)   LMP 09/20/2015 (Exact Date)   SpO2 97%   BMI 39.96 kg/m  Wt Readings from Last 3 Encounters:  06/08/20 240 lb 1.9 oz (108.9 kg)  05/27/20 238 lb 12.8 oz (108.3 kg)  05/11/20 242 lb (109.8 kg)     Health Maintenance Due  Topic Date Due  . Hepatitis C Screening  Never done    There are no preventive care reminders to display for this patient.  Lab Results  Component Value Date   TSH 1.426 04/19/2020   Lab Results  Component Value Date   WBC 7.8 02/11/2020   HGB 13.8 02/11/2020   HCT 42.4 02/11/2020   MCV 95.7 02/11/2020   PLT 308 02/11/2020   Lab Results  Component Value Date   NA 139 04/19/2020   K 3.9  04/19/2020   CO2 24 04/19/2020   GLUCOSE 180 (H) 04/19/2020   BUN 9 04/19/2020   CREATININE 0.86 04/19/2020   BILITOT 0.8 04/19/2020   ALKPHOS 118 04/19/2020   AST 21 04/19/2020   ALT 19 04/19/2020   PROT 8.5 (H) 04/19/2020   ALBUMIN 4.4 04/19/2020   CALCIUM 10.4 (H) 04/19/2020   ANIONGAP 9 04/19/2020   Lab Results  Component Value Date   CHOL 210 (H) 04/19/2020   Lab Results  Component Value Date   HDL 40 (L) 04/19/2020   Lab Results  Component Value Date   LDLCALC 151 (H) 04/19/2020   Lab Results  Component Value Date   TRIG 96 04/19/2020   Lab Results  Component Value Date   CHOLHDL 5.3 04/19/2020   Lab Results  Component Value Date   HGBA1C 8.9 (A) 01/20/2020      Assessment & Plan:    Acute Otitis externa Started Ofloxacin drop Patient at high risk for bacterial (gram-negative) infection due to h/o DM Advised to avoid using qtips or picking on it Explained alarming signs of ear infection and advised to get immediate medical attention in that case Advised to follow up if symptoms don't improve in next 5 days or worsen, might need oral antibiotics in that case  Uncontrolled HTN Advised compliance to Lisinopril and Lasix Advised  to check BP at home and bring the log in the next visit Patient mentions anxiety as a contributing factor, is staying at hotel currently, sees a therapist for anxiety Might need another agent if persistent elevated BP Will check BMP in next visit  DM On Insulin (70/30) 50 U BID Glipizide Follows up with Dr Dorris Fetch Checks blood sugars at home regularly, advised to bring records in the next visit HbA1C, Urine microalbumin and diabetic foot exam in next visit Advised to get diabetic eye exam soon   Flu shot administered in today's visit.  Problem List Items Addressed This Visit    None    Visit Diagnoses    Acute otitis externa of left ear, unspecified type    -  Primary   Relevant Medications   ofloxacin (FLOXIN) 0.3 % OTIC solution   Need for immunization against influenza       Relevant Orders   Flu Vaccine QUAD 36+ mos IM (Completed)      Meds ordered this encounter  Medications  . ofloxacin (FLOXIN) 0.3 % OTIC solution    Sig: Place 5 drops into the left ear 2 (two) times daily.    Dispense:  5 mL    Refill:  0    Follow-up: Return if symptoms worsen or fail to improve.    Lindell Spar, MD

## 2020-06-10 ENCOUNTER — Ambulatory Visit: Payer: Medicaid Other | Admitting: Orthopaedic Surgery

## 2020-06-14 ENCOUNTER — Ambulatory Visit: Payer: Medicaid Other | Admitting: "Endocrinology

## 2020-06-17 ENCOUNTER — Encounter: Payer: Self-pay | Admitting: Orthopaedic Surgery

## 2020-06-17 ENCOUNTER — Other Ambulatory Visit: Payer: Self-pay

## 2020-06-17 ENCOUNTER — Ambulatory Visit (INDEPENDENT_AMBULATORY_CARE_PROVIDER_SITE_OTHER): Payer: Medicaid Other | Admitting: Orthopaedic Surgery

## 2020-06-17 VITALS — Ht 65.0 in | Wt 235.0 lb

## 2020-06-17 DIAGNOSIS — F17210 Nicotine dependence, cigarettes, uncomplicated: Secondary | ICD-10-CM | POA: Diagnosis not present

## 2020-06-17 DIAGNOSIS — M25512 Pain in left shoulder: Secondary | ICD-10-CM | POA: Diagnosis not present

## 2020-06-17 NOTE — Progress Notes (Signed)
Patient Sonya James, female DOB:[birth year], 54 y.o. BTD:[id number]  Chief Complaint  Patient presents with  . Shoulder Pain    Lt shoulder    HPI  GENESEE James is a 54 y.o. female who has continued pain of the left shoulder.  She had MRI done which showed: IMPRESSION: 1. Limited study.  No definite rotator cuff tear. 2. Moderate supraspinatus tendinosis.  I have explained the findings to her.  She does not need surgery.  I will begin OT.  She is to stay on current medicine.    Body mass index is 39.11 kg/m.  ROS  Review of Systems  HENT: Negative for congestion.   Respiratory: Positive for cough and shortness of breath.   Cardiovascular: Positive for leg swelling. Negative for chest pain.  Endocrine: Positive for cold intolerance.  Musculoskeletal: Positive for arthralgias, gait problem and joint swelling.  Allergic/Immunologic: Positive for environmental allergies.  Neurological: Positive for headaches.  Psychiatric/Behavioral: The patient is nervous/anxious.     All other systems reviewed and are negative.  The following is a summary of the past history medically, past history surgically, known current medicines, social history and family history.  This information is gathered electronically by the computer from prior information and documentation.  I review this each visit and have found including this information at this point in the chart is beneficial and informative.    Past Medical History:  Diagnosis Date  . Anemia   . Anxiety   . Arthritis   . Asthma   . Asthma 05/25/2013  . Autonomic neuropathy   . Cancer of endometrium ([place]) 10/18/2015  . Constipation   . COPD (chronic obstructive pulmonary disease) ([place])   . CTS (carpal tunnel syndrome)   . Depression   . Diabetes mellitus without complication ([place])   . Dysphagia 12/14/2016  . Encounter for screening colonoscopy   . Endometrial cancer ([place]) 2017  . Gastritis without bleeding   . GERD  (gastroesophageal reflux disease)   . Gross hematuria 05/11/2019  . Headache(784.0)   . Heavy menses 10/11/2015  . HTN (hypertension)   . Hypercholesterolemia   . Hypothyroidism   . IBS (irritable bowel syndrome)   . Osteoarthritis    osteoarthritis  . Pancreatitis    per patient   . Recurrent boils    buttocks and low back.  . S/P laparoscopic hysterectomy 11/04/2015  . Shortness of breath   . Sleep apnea    CPAP machine  . Uncontrolled type 2 diabetes mellitus with diabetic polyneuropathy, with long-term current use of insulin ([place]) 08/04/2017  . Uterine cancer ([place]) 2017  . Vitamin D deficiency     Past Surgical History:  Procedure Laterality Date  . ABDOMINAL HYSTERECTOMY  2018   endometrial cancer, surgery done at [facility]   . BIOPSY  01/02/2017   Procedure: BIOPSY;  Surgeon: Danie Binder, MD;  [facility];  Service: Endoscopy;;  gastric biopsy  . CESAREAN SECTION  1989  . COLONOSCOPY WITH PROPOFOL N/A 01/02/2017   Dr. Oneida Alar: redundant colon, two 2-3 mm polyps in sigmoid colon (hyperplastic)  . ESOPHAGOGASTRODUODENOSCOPY  05/22/2011   Dr. Oneida Alar: H.pylori gastritis   . ESOPHAGOGASTRODUODENOSCOPY (EGD) WITH PROPOFOL N/A 01/02/2017   Dr. 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 FOOT SURGERY Left    tendon repair  . OOPHORECTOMY    . POLYPECTOMY  01/02/2017   Procedure: POLYPECTOMY;  Surgeon: Danie Binder, MD;  [facility];  Service: Endoscopy;;  sigmoid colon polyps times 2  . REDUCTION MAMMAPLASTY  1996  . SAVORY DILATION N/A 01/02/2017   Procedure: SAVORY DILATION;  Surgeon: Danie Binder, MD;  [facility];  Service: Endoscopy;  Laterality: N/A;  . TUBAL LIGATION      Family History  Problem Relation Age of Onset  . Diabetes Mother   . Hypertension Mother   . COPD Mother   . Asthma Mother   . Hypercholesterolemia Mother   . Hypertension Sister   . Hyperlipidemia  Sister   . Diabetes Sister   . Asthma Brother   . Alcohol abuse Brother   . Asthma Son   . Cancer Maternal Grandmother        lung, throat, breast  . Alzheimer's disease Maternal Grandmother   . Hypertension Maternal Grandmother   . Hypercholesterolemia Maternal Grandmother   . Heart disease Maternal Grandfather   . Stroke Maternal Grandfather   . Clotting disorder Maternal Grandfather        blood clots in legs  . Hypertension Sister   . Hyperlipidemia Sister   . Stroke Maternal Aunt   . Clotting disorder Maternal Aunt        blood clots in legs  . Hypertension Maternal Aunt   . Hypercholesterolemia Maternal Aunt   . Hypertension Maternal Aunt   . Asthma Maternal Aunt   . Clotting disorder Maternal Uncle        blood clot -legs traveled to heart and he died of heart attack  . Colon cancer Neg Hx     Social History Social History   Tobacco Use  . Smoking status: Current Some Day Smoker    Packs/day: 0.50    Years: 22.00    Pack years: 11.00    Types: Cigarettes  . Smokeless tobacco: Never Used  Vaping Use  . Vaping Use: Never used  Substance Use Topics  . Alcohol use: No  . Drug use: No    No Known Allergies  Current Outpatient Medications  Medication Sig Dispense Refill  . albuterol (PROVENTIL) (2.5 MG/3ML) 0.083% nebulizer solution Take 3 mLs (2.5 mg total) by nebulization every 6 (six) hours as needed for wheezing. 75 mL 12  . albuterol (VENTOLIN HFA) 108 (90 Base) MCG/ACT inhaler Inhale 2 puffs into the lungs every 6 (six) hours as needed for wheezing or shortness of breath. 18 g 5  . apixaban (ELIQUIS) 5 MG TABS tablet Take 1 tablet (5 mg total) by mouth 2 (two) times daily. On 08/12/17 @ 9PM, start 1 tab (5 mg) two times daily. 60 tablet 5  . atorvastatin (LIPITOR) 80 MG tablet Take 1 tablet (80 mg total) by mouth daily. 90 tablet 0  . Blood Glucose Monitoring Suppl (ACCU-CHEK GUIDE ME) w/Device KIT 1 Piece by Does not apply route as directed. 1 kit 0  .  esomeprazole (NEXIUM) 40 MG capsule Take 1 capsule (40 mg total) by mouth 2 (two) times daily before a meal. 180 capsule 3  . furosemide (LASIX) 40 MG tablet Take 1 tablet (40 mg total) by mouth daily. 30 tablet 5  . glipiZIDE (GLUCOTROL XL) 5 MG 24 hr tablet Take 1 tablet by mouth once daily with breakfast 90 tablet 0  . glucose blood (ACCU-CHEK GUIDE) test strip Use as instructed 150 each 2  . hydrocortisone (ANUSOL-HC) 2.5 % rectal cream Place 1 application rectally 2 (two) times daily. As needed for itching 30 g 1  . insulin NPH-regular  Human (NOVOLIN 70/30 RELION) (70-30) 100 UNIT/ML injection Inject 50 Units into the skin 2 (two) times daily with a meal. 30 mL 2  . linaclotide (LINZESS) 290 MCG CAPS capsule Take 1 capsule (290 mcg total) by mouth daily before breakfast. 90 capsule 3  . lisinopril (ZESTRIL) 40 MG tablet Take 1 tablet (40 mg total) by mouth daily. 30 tablet 1  . metFORMIN (GLUCOPHAGE) 1000 MG tablet Take 1 tablet (1,000 mg total) by mouth 2 (two) times daily with a meal. 180 tablet 0  . methocarbamol (ROBAXIN) 500 MG tablet Take 1 tablet (500 mg total) by mouth 2 (two) times daily. 20 tablet 0  . montelukast (SINGULAIR) 10 MG tablet Take 1 tablet (10 mg total) by mouth at bedtime. 30 tablet 5  . ofloxacin (FLOXIN) 0.3 % OTIC solution Place 5 drops into the left ear 2 (two) times daily. 5 mL 0  . ondansetron (ZOFRAN ODT) 4 MG disintegrating tablet Take 4 mg by mouth as needed for nausea or vomiting.     . potassium chloride (KLOR-CON) 10 MEQ tablet Take 1 tablet (10 mEq total) by mouth every evening. 30 tablet 5  . silver sulfADIAZINE (SILVADENE) 1 % cream Apply 1 application topically daily. (Patient taking differently: Apply 1 application topically as needed. ) 50 g 0  . Vitamin D, Ergocalciferol, (DRISDOL) 1.25 MG (50000 UNIT) CAPS capsule Take 1 capsule (50,000 Units total) by mouth every 7 (seven) days. 4 capsule 2   No current facility-administered medications for this  visit.     Physical Exam  Height $Remov'5\' 5"'boGSqr$  (1.651 m), weight 235 lb (106.6 kg), last menstrual period 09/20/2015.  Constitutional: overall normal hygiene, normal nutrition, well developed, normal grooming, normal body habitus. Assistive device:none  Musculoskeletal: gait and station Limp none, muscle tone and strength are normal, no tremors or atrophy is present.  .  Neurological: coordination overall normal.  Deep tendon reflex/nerve stretch intact.  Sensation normal.  Cranial nerves II-XII intact.   Skin:   Normal overall no scars, lesions, ulcers or rashes. No psoriasis.  Psychiatric: Alert and oriented x 3.  Recent memory intact, remote memory unclear.  Normal mood and affect. Well groomed.  Good eye contact.  Cardiovascular: overall no swelling, no varicosities, no edema bilaterally, normal temperatures of the legs and arms, no clubbing, cyanosis and good capillary refill.  Lymphatic: palpation is normal.  Left shoulder pain with some pain, more in the extremes.  All other systems reviewed and are negative   The patient has been educated about the nature of the problem(s) and counseled on treatment options.  The patient appeared to understand what I have discussed and is in agreement with it.  Encounter Diagnoses  Name Primary?  . Pain in joint of left shoulder Yes  . Cigarette nicotine dependence without complication     PLAN Call if any problems.  Precautions discussed.  Continue current medications.   Return to clinic 3 weeks   Begin OT.  Electronically Signed Sanjuana Kava, MD 10/7/202110:20 AM

## 2020-06-17 NOTE — Progress Notes (Signed)
Use Aspercreme, Biofreeze or Voltaren gel over the counter 2-3 times daily make sure you rub it in well each time you use it.

## 2020-06-29 ENCOUNTER — Ambulatory Visit: Payer: Medicaid Other | Admitting: Family Medicine

## 2020-07-02 ENCOUNTER — Ambulatory Visit (HOSPITAL_COMMUNITY): Payer: Medicaid Other | Admitting: Occupational Therapy

## 2020-07-06 ENCOUNTER — Ambulatory Visit: Payer: Medicaid Other | Admitting: Family Medicine

## 2020-07-08 ENCOUNTER — Other Ambulatory Visit: Payer: Self-pay

## 2020-07-08 ENCOUNTER — Encounter: Payer: Self-pay | Admitting: Orthopaedic Surgery

## 2020-07-08 ENCOUNTER — Ambulatory Visit (INDEPENDENT_AMBULATORY_CARE_PROVIDER_SITE_OTHER): Payer: Medicaid Other | Admitting: Orthopaedic Surgery

## 2020-07-08 ENCOUNTER — Ambulatory Visit (INDEPENDENT_AMBULATORY_CARE_PROVIDER_SITE_OTHER): Payer: Medicaid Other | Admitting: Family Medicine

## 2020-07-08 ENCOUNTER — Encounter: Payer: Self-pay | Admitting: Family Medicine

## 2020-07-08 VITALS — BP 136/76 | HR 72 | Temp 97.0°F | Ht 65.0 in | Wt 246.0 lb

## 2020-07-08 VITALS — BP 192/88 | HR 77 | Ht 65.0 in | Wt 239.0 lb

## 2020-07-08 DIAGNOSIS — I10 Essential (primary) hypertension: Secondary | ICD-10-CM

## 2020-07-08 DIAGNOSIS — M25512 Pain in left shoulder: Secondary | ICD-10-CM | POA: Diagnosis not present

## 2020-07-08 DIAGNOSIS — H60502 Unspecified acute noninfective otitis externa, left ear: Secondary | ICD-10-CM | POA: Diagnosis not present

## 2020-07-08 DIAGNOSIS — F17210 Nicotine dependence, cigarettes, uncomplicated: Secondary | ICD-10-CM

## 2020-07-08 DIAGNOSIS — Z6841 Body Mass Index (BMI) 40.0 and over, adult: Secondary | ICD-10-CM

## 2020-07-08 NOTE — Patient Instructions (Signed)
  HAPPY FALL!  I appreciate the opportunity to provide you with care for your health and wellness. Today we discussed: ear infection  Follow up: 3-43months   No labs  Referrals today: ear dr  Please continue to practice social distancing to keep you, your family, and our community safe.  If you must go out, please wear a mask and practice good handwashing.  It was a pleasure to see you and I look forward to continuing to work together on your health and well-being. Please do not hesitate to call the office if you need care or have questions about your care.  Have a wonderful day and week. With Gratitude, Cherly Beach, DNP, AGNP-BC

## 2020-07-08 NOTE — Assessment & Plan Note (Signed)
Controlled today, continue medication at this time

## 2020-07-08 NOTE — Progress Notes (Signed)
Subjective:  Patient ID: Sonya James, female    DOB: 04/26/1966  Age: 54 y.o. MRN: [id number]  CC:  Chief Complaint  Patient presents with  . Follow-up    ear pain x3 months      HPI  HPI Sonya James is a 54 year old female with past medical history of uncontrolled hypertension, diabetes, PE, COPD and obesity presents for evaluation for left ear pain, swelling and discharge.    She ws seen on 9/28 by Dr Posey Pronto and Dx with Acute Otitis Externa. Was started on Ofloxacin drops. She comes in today stating she never got better and is having pain in the ear. She never followed up within the 5 days of using the drops. She does have transportation troubles.   Background: She states that she had a blackhead in her left ear canal, which she tried to pop.  She states she had discharge after it.  Towards the end of Sept before her appt with Dr Posey Pronto she had been having progressive left ear swelling and the discharge has been foul-smelling.  She tried applying Neosporin cream and was keeping cotton plug in the ear to stop the discharge. She was having mild hearing difficulty And continues to do so.  She denies fever, chills, runny nose, sinus pressure, throat pain, occipital headache, nausea, vomiting.  Today patient denies signs and symptoms of COVID 19 infection including fever, chills, cough, shortness of breath, and headache. Past Medical, Surgical, Social History, Allergies, and Medications have been Reviewed.   Past Medical History:  Diagnosis Date  . Anemia   . Anxiety   . Arthritis   . Asthma   . Asthma 05/25/2013  . Autonomic neuropathy   . Cancer of endometrium ([place]) 10/18/2015  . Constipation   . COPD (chronic obstructive pulmonary disease) ([place])   . CTS (carpal tunnel syndrome)   . Depression   . Diabetes mellitus without complication ([place])   . Dysphagia 12/14/2016  . Encounter for screening colonoscopy   . Endometrial cancer ([place]) 2017  . Gastritis without bleeding   .  GERD (gastroesophageal reflux disease)   . Gross hematuria 05/11/2019  . Headache(784.0)   . Heavy menses 10/11/2015  . HTN (hypertension)   . Hypercholesterolemia   . Hypothyroidism   . IBS (irritable bowel syndrome)   . Osteoarthritis    osteoarthritis  . Pancreatitis    per patient   . Recurrent boils    buttocks and low back.  . S/P laparoscopic hysterectomy 11/04/2015  . Shortness of breath   . Sleep apnea    CPAP machine  . Uncontrolled type 2 diabetes mellitus with diabetic polyneuropathy, with long-term current use of insulin ([place]) 08/04/2017  . Uterine cancer ([place]) 2017  . Vitamin D deficiency     Current Meds  Medication Sig  . albuterol (PROVENTIL) (2.5 MG/3ML) 0.083% nebulizer solution Take 3 mLs (2.5 mg total) by nebulization every 6 (six) hours as needed for wheezing.  [name] albuterol (VENTOLIN HFA) 108 (90 Base) MCG/ACT inhaler Inhale 2 puffs into the lungs every 6 (six) hours as needed for wheezing or shortness of breath.  [name] apixaban (ELIQUIS) 5 MG TABS tablet Take 1 tablet (5 mg total) by mouth 2 (two) times daily. On 08/12/17 @ 9PM, start 1 tab (5 mg) two times daily.  [name] atorvastatin (LIPITOR) 80 MG tablet Take 1 tablet (80 mg total) by mouth daily.  . Blood Glucose Monitoring Suppl (ACCU-CHEK GUIDE ME) w/Device KIT 1 Piece by  Does not apply route as directed.  [name] esomeprazole (NEXIUM) 40 MG capsule Take 1 capsule (40 mg total) by mouth 2 (two) times daily before a meal.  . furosemide (LASIX) 40 MG tablet Take 1 tablet (40 mg total) by mouth daily.  [name] glipiZIDE (GLUCOTROL XL) 5 MG 24 hr tablet Take 1 tablet by mouth once daily with breakfast  . glucose blood (ACCU-CHEK GUIDE) test strip Use as instructed  . hydrocortisone (ANUSOL-HC) 2.5 % rectal cream Place 1 application rectally 2 (two) times daily. As needed for itching  . insulin NPH-regular Human (NOVOLIN 70/30 RELION) (70-30) 100 UNIT/ML injection Inject 50 Units into the skin 2 (two) times daily with a meal.  .  linaclotide (LINZESS) 290 MCG CAPS capsule Take 1 capsule (290 mcg total) by mouth daily before breakfast.  . lisinopril (ZESTRIL) 40 MG tablet Take 1 tablet (40 mg total) by mouth daily.  . metFORMIN (GLUCOPHAGE) 1000 MG tablet Take 1 tablet (1,000 mg total) by mouth 2 (two) times daily with a meal.  . methocarbamol (ROBAXIN) 500 MG tablet Take 1 tablet (500 mg total) by mouth 2 (two) times daily.  . montelukast (SINGULAIR) 10 MG tablet Take 1 tablet (10 mg total) by mouth at bedtime.  [name] ofloxacin (FLOXIN) 0.3 % OTIC solution Place 5 drops into the left ear 2 (two) times daily.  . ondansetron (ZOFRAN ODT) 4 MG disintegrating tablet Take 4 mg by mouth as needed for nausea or vomiting.   . potassium chloride (KLOR-CON) 10 MEQ tablet Take 1 tablet (10 mEq total) by mouth every evening.  . silver sulfADIAZINE (SILVADENE) 1 % cream Apply 1 application topically daily.  . Vitamin D, Ergocalciferol, (DRISDOL) 1.25 MG (50000 UNIT) CAPS capsule Take 1 capsule (50,000 Units total) by mouth every 7 (seven) days.    ROS:  Review of Systems  Constitutional: Negative.   HENT: Positive for ear discharge and ear pain.   Eyes: Negative.   Respiratory: Negative.   Cardiovascular: Negative.   Gastrointestinal: Negative.   Genitourinary: Negative.   Musculoskeletal: Negative.   Skin: Negative.   Neurological: Negative.   Endo/Heme/Allergies: Negative.   Psychiatric/Behavioral: Negative.      Objective:   Today's Vitals: BP 136/76 (BP Location: Right Arm, Patient Position: Sitting, Cuff Size: Normal)   Pulse 72   Temp (!) 97 F (36.1 C) (Tympanic)   Ht _0  (1.651 m)   Wt 246 lb (111.6 kg)   LMP 09/20/2015 (Exact Date)   SpO2 98%   BMI 40.94 kg/m  Vitals with BMI 07/08/2020 07/08/2020 06/17/2020  Height _1  _2  _3   Weight 246 lbs 239 lbs 235 lbs  BMI 40.94 21.30 86.57  Systolic 846 962 -  Diastolic 76 88 -  Pulse 72 77 -     Physical Exam Vitals and nursing note reviewed.    Constitutional:      Appearance: Normal appearance. She is well-developed and well-groomed. She is obese.  HENT:     Head: Normocephalic and atraumatic.     Right Ear: External ear normal.     Left Ear: External ear normal. Decreased hearing noted. Swelling and tenderness present.     Ears:     Comments: swelling of antitragus to almost occulusion of canal    Mouth/Throat:     Comments: Mask in place  Eyes:     General:        Right eye: No discharge.        Left eye: No  discharge.     Conjunctiva/sclera: Conjunctivae normal.  Cardiovascular:     Rate and Rhythm: Normal rate and regular rhythm.     Pulses: Normal pulses.     Heart sounds: Normal heart sounds.  Pulmonary:     Effort: Pulmonary effort is normal.     Breath sounds: Normal breath sounds.  Musculoskeletal:        General: Normal range of motion.     Cervical back: Normal range of motion and neck supple.  Skin:    General: Skin is warm.  Neurological:     General: No focal deficit present.     Mental Status: She is alert and oriented to person, place, and time.  Psychiatric:        Attention and Perception: Attention normal.        Mood and Affect: Mood normal.        Speech: Speech normal.        Behavior: Behavior normal. Behavior is cooperative.        Thought Content: Thought content normal.        Cognition and Memory: Cognition normal.        Judgment: Judgment normal.      Assessment   1. Acute otitis externa of left ear, unspecified type   2. Essential hypertension, benign     Tests ordered Orders Placed This Encounter  Procedures  . Ambulatory referral to ENT     Plan: Please see assessment and plan per problem list above.   No orders of the defined types were placed in this encounter.   Patient to follow-up in 3-4 months   Note: This dictation was prepared with Dragon dictation along with smaller phrase technology. Similar sounding words can be transcribed inadequately or may not  be corrected upon review. Any transcriptional errors that result from this process are unintentional.      Perlie Mayo, NP

## 2020-07-08 NOTE — Progress Notes (Signed)
PROCEDURE NOTE:  The patient request injection, verbal consent was obtained.  The left shoulder was prepped appropriately after time out was performed.   Sterile technique was observed and injection of 1 cc of Depo-Medrol 40 mg with several cc's of plain xylocaine. Anesthesia was provided by ethyl chloride and a 20-gauge needle was used to inject the shoulder area. A posterior approach was used.  The injection was tolerated well.  A band aid dressing was applied.  The patient was advised to apply ice later today and tomorrow to the injection sight as needed.  Keep up OT.  Return in one month.  Electronically Signed Sanjuana Kava, MD 10/28/20218:49 AM

## 2020-07-08 NOTE — Assessment & Plan Note (Signed)
Failed treatment with drops, most likely needs oral anbx. Referral to ENT to make sure no abscess has developed in the swollen area. Difficulty seeing into the inner ear.  Patient acknowledged agreement and understanding of the plan.  Advised to not put anything into the ear

## 2020-07-13 ENCOUNTER — Other Ambulatory Visit: Payer: Self-pay | Admitting: Family Medicine

## 2020-07-13 DIAGNOSIS — B379 Candidiasis, unspecified: Secondary | ICD-10-CM

## 2020-07-14 ENCOUNTER — Ambulatory Visit (HOSPITAL_COMMUNITY): Payer: Medicaid Other | Attending: Orthopaedic Surgery | Admitting: Specialist

## 2020-07-19 ENCOUNTER — Other Ambulatory Visit: Payer: Self-pay

## 2020-07-21 DIAGNOSIS — M79606 Pain in leg, unspecified: Secondary | ICD-10-CM | POA: Diagnosis not present

## 2020-07-21 DIAGNOSIS — M25569 Pain in unspecified knee: Secondary | ICD-10-CM | POA: Diagnosis not present

## 2020-07-21 DIAGNOSIS — Z79891 Long term (current) use of opiate analgesic: Secondary | ICD-10-CM | POA: Diagnosis not present

## 2020-07-21 DIAGNOSIS — M542 Cervicalgia: Secondary | ICD-10-CM | POA: Diagnosis not present

## 2020-07-21 DIAGNOSIS — E1140 Type 2 diabetes mellitus with diabetic neuropathy, unspecified: Secondary | ICD-10-CM | POA: Diagnosis not present

## 2020-07-21 DIAGNOSIS — M545 Low back pain, unspecified: Secondary | ICD-10-CM | POA: Diagnosis not present

## 2020-07-27 ENCOUNTER — Ambulatory Visit (HOSPITAL_COMMUNITY): Payer: Medicaid Other

## 2020-08-14 ENCOUNTER — Other Ambulatory Visit: Payer: Self-pay | Admitting: Family Medicine

## 2020-08-16 ENCOUNTER — Other Ambulatory Visit: Payer: Self-pay

## 2020-08-16 ENCOUNTER — Encounter: Payer: Self-pay | Admitting: Internal Medicine

## 2020-08-16 ENCOUNTER — Telehealth (INDEPENDENT_AMBULATORY_CARE_PROVIDER_SITE_OTHER): Payer: Medicaid Other | Admitting: Internal Medicine

## 2020-08-16 VITALS — Ht 65.0 in | Wt 230.0 lb

## 2020-08-16 DIAGNOSIS — N760 Acute vaginitis: Secondary | ICD-10-CM | POA: Diagnosis not present

## 2020-08-16 MED ORDER — FLUCONAZOLE 150 MG PO TABS: 150.0000 mg | ORAL_TABLET | Freq: Once | ORAL | 0 refills | Status: AC

## 2020-08-16 NOTE — Patient Instructions (Addendum)
Please take Fluconazole for fungal infection.  Please use Vagisil to clean the perineal area.  Okay to use over-the-counter lotions to help with itching.

## 2020-08-16 NOTE — Progress Notes (Signed)
Virtual Visit via Telephone Note   This visit type was conducted due to national recommendations for restrictions regarding the COVID-19 Pandemic (e.g. social distancing) in an effort to limit this patient's exposure and mitigate transmission in our community.  Due to her co-morbid illnesses, this patient is at least at moderate risk for complications without adequate follow up.  This format is felt to be most appropriate for this patient at this time.  The patient did not have access to video technology/had technical difficulties with video requiring transitioning to audio format only (telephone).  All issues noted in this document were discussed and addressed.  No physical exam could be performed with this format.  Evaluation Performed:  Follow-up visit  Date:  08/16/2020   ID:  Sonya James, DOB 1965/11/29, MRN [id number]  Patient Location: Home Provider Location: Office/Clinic  Participants: Patient Location of Patient: Home Location of Provider: Telehealth Consent was obtain for visit to be over via telehealth. I verified that I am speaking with the correct person using two identifiers.  PCP:  Perlie Mayo, NP   Chief Complaint: Itching in the perineal area  History of Present Illness:    Sonya James is a 54 y.o. female with recent history of otitis media treated with oral antibiotics who has a televisit for complaint of perineal area itching and rash.  She recently completed Bactrim for cellulitis around the left ear.  Since then, she started having perineal area itching and rash, for which she has been applying hydrocortisone cream.  She has noticed white, thick vaginal discharge, which is intermittent.  She denies any pelvic or flank pain.  She denies dysuria or hematuria.  She denies any fever, chills, nausea, vomiting.  She has a follow-up appointment with ENT specialist for evaluation of persistent left ear swelling.   The patient does not have symptoms concerning  for COVID-19 infection (fever, chills, cough, or new shortness of breath).   Past Medical, Surgical, Social History, Allergies, and Medications have been Reviewed.  Past Medical History:  Diagnosis Date  . Anemia   . Anxiety   . Arthritis   . Asthma   . Asthma 05/25/2013  . Autonomic neuropathy   . Cancer of endometrium ([place]) 10/18/2015  . Constipation   . COPD (chronic obstructive pulmonary disease) ([place])   . CTS (carpal tunnel syndrome)   . Depression   . Diabetes mellitus without complication ([place])   . Dysphagia 12/14/2016  . Encounter for screening colonoscopy   . Endometrial cancer ([place]) 2017  . Gastritis without bleeding   . GERD (gastroesophageal reflux disease)   . Gross hematuria 05/11/2019  . Headache(784.0)   . Heavy menses 10/11/2015  . HTN (hypertension)   . Hypercholesterolemia   . Hypothyroidism   . IBS (irritable bowel syndrome)   . Osteoarthritis    osteoarthritis  . Pancreatitis    per patient   . Recurrent boils    buttocks and low back.  . S/P laparoscopic hysterectomy 11/04/2015  . Shortness of breath   . Sleep apnea    CPAP machine  . Uncontrolled type 2 diabetes mellitus with diabetic polyneuropathy, with long-term current use of insulin ([place]) 08/04/2017  . Uterine cancer ([place]) 2017  . Vitamin D deficiency    Past Surgical History:  Procedure Laterality Date  . ABDOMINAL HYSTERECTOMY  2018   endometrial cancer, surgery done at [facility]   . BIOPSY  01/02/2017   Procedure: BIOPSY;  Surgeon: Danie Binder,  MD;  Location: [facility];  Service: Endoscopy;;  gastric biopsy  . CESAREAN SECTION  1989  . COLONOSCOPY WITH PROPOFOL N/A 01/02/2017   Dr. Oneida Alar: redundant colon, two 2-3 mm polyps in sigmoid colon (hyperplastic)  . ESOPHAGOGASTRODUODENOSCOPY  05/22/2011   Dr. Oneida Alar: H.pylori gastritis   . ESOPHAGOGASTRODUODENOSCOPY (EGD) WITH PROPOFOL N/A 01/02/2017   Dr. 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 s/p dilation, moderate gastritis (negative  H.pylori)  . FOOT SURGERY Left    tendon repair  . OOPHORECTOMY    . POLYPECTOMY  01/02/2017   Procedure: POLYPECTOMY;  Surgeon: Danie Binder, MD;  Location: [facility];  Service: Endoscopy;;  sigmoid colon polyps times 2  . REDUCTION MAMMAPLASTY  1996  . SAVORY DILATION N/A 01/02/2017   Procedure: SAVORY DILATION;  Surgeon: Danie Binder, MD;  Location: [facility];  Service: Endoscopy;  Laterality: N/A;  . TUBAL LIGATION       Current Meds  Medication Sig  . albuterol (PROVENTIL) (2.5 MG/3ML) 0.083% nebulizer solution Take 3 mLs (2.5 mg total) by nebulization every 6 (six) hours as needed for wheezing.  [name] albuterol (VENTOLIN HFA) 108 (90 Base) MCG/ACT inhaler Inhale 2 puffs into the lungs every 6 (six) hours as needed for wheezing or shortness of breath.  [name] apixaban (ELIQUIS) 5 MG TABS tablet Take 1 tablet (5 mg total) by mouth 2 (two) times daily. On 08/12/17 @ 9PM, start 1 tab (5 mg) two times daily.  [name] atorvastatin (LIPITOR) 80 MG tablet Take 1 tablet (80 mg total) by mouth daily.  . Blood Glucose Monitoring Suppl (ACCU-CHEK GUIDE ME) w/Device KIT 1 Piece by Does not apply route as directed.  [name] esomeprazole (NEXIUM) 40 MG capsule Take 1 capsule (40 mg total) by mouth 2 (two) times daily before a meal.  . furosemide (LASIX) 40 MG tablet Take 1 tablet (40 mg total) by mouth daily.  [name] glipiZIDE (GLUCOTROL XL) 5 MG 24 hr tablet Take 1 tablet by mouth once daily with breakfast  . glucose blood (ACCU-CHEK GUIDE) test strip Use as instructed  . hydrocortisone (ANUSOL-HC) 2.5 % rectal cream Place 1 application rectally 2 (two) times daily. As needed for itching  . insulin NPH-regular Human (NOVOLIN 70/30 RELION) (70-30) 100 UNIT/ML injection Inject 50 Units into the skin 2 (two) times daily with a meal.  . linaclotide (LINZESS) 290 MCG CAPS capsule Take 1 capsule (290 mcg total) by mouth daily before breakfast.  . lisinopril (ZESTRIL) 40 MG tablet Take 1 tablet (40 mg total) by mouth  daily.  . metFORMIN (GLUCOPHAGE) 1000 MG tablet Take 1 tablet (1,000 mg total) by mouth 2 (two) times daily with a meal.  . methocarbamol (ROBAXIN) 500 MG tablet Take 1 tablet (500 mg total) by mouth 2 (two) times daily.  . montelukast (SINGULAIR) 10 MG tablet Take 1 tablet (10 mg total) by mouth at bedtime.  [name] ofloxacin (FLOXIN) 0.3 % OTIC solution Place 5 drops into the left ear 2 (two) times daily.  . ondansetron (ZOFRAN ODT) 4 MG disintegrating tablet Take 4 mg by mouth as needed for nausea or vomiting.   . potassium chloride (KLOR-CON) 10 MEQ tablet Take 1 tablet (10 mEq total) by mouth every evening.  . silver sulfADIAZINE (SILVADENE) 1 % cream Apply 1 application topically daily.  . Vitamin D, Ergocalciferol, (DRISDOL) 1.25 MG (50000 UNIT) CAPS capsule Take 1 capsule (50,000 Units total) by mouth every 7 (seven) days.     Allergies:   Patient has  no known allergies.   ROS:   Please see the history of present illness.    Review of Systems  Constitutional: Negative for chills and fever.  HENT: Positive for ear discharge and ear pain.   Eyes: Negative for pain and redness.  Respiratory: Negative for cough and shortness of breath.   Cardiovascular: Negative for chest pain and palpitations.  Musculoskeletal: Negative for falls and neck pain.  Skin: Positive for itching and rash.  Neurological: Negative for dizziness, tingling, sensory change and focal weakness.  Psychiatric/Behavioral: Negative for substance abuse and suicidal ideas.    Labs/Other Tests and Data Reviewed:    Recent Labs: 02/11/2020: Hemoglobin 13.8; Platelets 308 04/19/2020: ALT 19; BUN 9; Creatinine, Ser 0.86; Potassium 3.9; Sodium 139; TSH 1.426   Recent Lipid Panel Lab Results  Component Value Date/Time   CHOL 210 (H) 04/19/2020 11:59 AM   TRIG 96 04/19/2020 11:59 AM   HDL 40 (L) 04/19/2020 11:59 AM   CHOLHDL 5.3 04/19/2020 11:59 AM   LDLCALC 151 (H) 04/19/2020 11:59 AM   LDLCALC 171 (H) 12/17/2019 09:46 AM     Wt Readings from Last 3 Encounters:  08/16/20 230 lb (104.3 kg)  07/08/20 246 lb (111.6 kg)  07/08/20 239 lb (108.4 kg)      ASSESSMENT & PLAN:    Fungal vaginitis Vaginal discharge with perineal area itching Prescribed fluconazole Keep the area clean and dry Advised to use Vagisil  Left ear cellulitis S/p Bactrim Follow-up with ENT specialist as scheduled  Time:   Today, I have spent 15 minutes reviewing the chart, including problem list, medications, and with the patient with telehealth technology discussing the above problems.   Medication Adjustments/Labs and Tests Ordered: Current medicines are reviewed at length with the patient today.  Concerns regarding medicines are outlined above.   Tests Ordered: No orders of the defined types were placed in this encounter.   Medication Changes: No orders of the defined types were placed in this encounter.    Note: This dictation was prepared with Dragon dictation along with smaller phrase technology. Similar sounding words can be transcribed inadequately or may not be corrected upon review. Any transcriptional errors that result from this process are unintentional.      Disposition:  Follow up  Signed, Lindell Spar, MD  08/16/2020 11:32 AM     [place]

## 2020-08-20 ENCOUNTER — Other Ambulatory Visit: Payer: Self-pay | Admitting: "Endocrinology

## 2020-08-20 DIAGNOSIS — Z794 Long term (current) use of insulin: Secondary | ICD-10-CM

## 2020-08-20 DIAGNOSIS — E1169 Type 2 diabetes mellitus with other specified complication: Secondary | ICD-10-CM

## 2020-08-30 ENCOUNTER — Other Ambulatory Visit: Payer: Self-pay

## 2020-08-30 MED ORDER — ATORVASTATIN CALCIUM 80 MG PO TABS
80.0000 mg | ORAL_TABLET | Freq: Every day | ORAL | 0 refills | Status: DC
Start: 2020-08-30 — End: 2021-01-06

## 2020-08-30 MED ORDER — LISINOPRIL 40 MG PO TABS
40.0000 mg | ORAL_TABLET | Freq: Every day | ORAL | 1 refills | Status: DC
Start: 2020-08-30 — End: 2020-10-25

## 2020-10-05 ENCOUNTER — Telehealth: Payer: Self-pay

## 2020-10-05 ENCOUNTER — Encounter: Payer: Self-pay | Admitting: Orthopaedic Surgery

## 2020-10-05 ENCOUNTER — Other Ambulatory Visit: Payer: Self-pay

## 2020-10-05 ENCOUNTER — Ambulatory Visit (INDEPENDENT_AMBULATORY_CARE_PROVIDER_SITE_OTHER): Payer: Medicaid Other | Admitting: Orthopaedic Surgery

## 2020-10-05 ENCOUNTER — Other Ambulatory Visit: Payer: Self-pay | Admitting: Otolaryngology

## 2020-10-05 VITALS — BP 137/96 | HR 76 | Ht 65.0 in | Wt 263.0 lb

## 2020-10-05 DIAGNOSIS — M25561 Pain in right knee: Secondary | ICD-10-CM | POA: Diagnosis not present

## 2020-10-05 DIAGNOSIS — G8929 Other chronic pain: Secondary | ICD-10-CM | POA: Diagnosis not present

## 2020-10-05 DIAGNOSIS — M25562 Pain in left knee: Secondary | ICD-10-CM

## 2020-10-05 DIAGNOSIS — H6192 Disorder of left external ear, unspecified: Secondary | ICD-10-CM

## 2020-10-05 NOTE — Telephone Encounter (Signed)
Patient calling she is needing a refill on her hydrocodone sent to [facility] in [place] she states her bottle shows 5 refills but they are telling her she needs authorization [phone]

## 2020-10-05 NOTE — Telephone Encounter (Signed)
Pt informed

## 2020-10-05 NOTE — Telephone Encounter (Signed)
Please advise 

## 2020-10-05 NOTE — Telephone Encounter (Signed)
I clearly do not prescribe this. She will need to talk to the provider who ordered it for her. This goes for all controlled substances. Please let her know I do not prescribe them. Thank you.

## 2020-10-05 NOTE — Progress Notes (Signed)
PROCEDURE NOTE:  The patient requests injections of the right knee , verbal consent was obtained.  The right knee was prepped appropriately after time out was performed.   Sterile technique was observed and injection of 1 cc of Depo-Medrol 40 mg with several cc's of plain xylocaine. Anesthesia was provided by ethyl chloride and a 20-gauge needle was used to inject the knee area. The injection was tolerated well.  A band aid dressing was applied.  The patient was advised to apply ice later today and tomorrow to the injection sight as needed.  PROCEDURE NOTE:  The patient requests injections of the left knee , verbal consent was obtained.  The left knee was prepped appropriately after time out was performed.   Sterile technique was observed and injection of 1 cc of Depo-Medrol 40 mg with several cc's of plain xylocaine. Anesthesia was provided by ethyl chloride and a 20-gauge needle was used to inject the knee area. The injection was tolerated well.  A band aid dressing was applied.  The patient was advised to apply ice later today and tomorrow to the injection sight as needed.  Return in six weeks.  Call if any problem.  Precautions discussed.   Electronically Signed Sanjuana Kava, MD 1/25/20221:42 PM

## 2020-10-18 ENCOUNTER — Other Ambulatory Visit: Payer: Self-pay

## 2020-10-18 ENCOUNTER — Ambulatory Visit
Admission: RE | Admit: 2020-10-18 | Discharge: 2020-10-18 | Disposition: A | Discharge status: Home / Self Care | Payer: Medicaid Other | Source: Ambulatory Visit | Attending: Otolaryngology | Admitting: Otolaryngology

## 2020-10-18 DIAGNOSIS — H9202 Otalgia, left ear: Secondary | ICD-10-CM | POA: Diagnosis not present

## 2020-10-18 DIAGNOSIS — H6192 Disorder of left external ear, unspecified: Secondary | ICD-10-CM

## 2020-10-18 MED ORDER — IOPAMIDOL (ISOVUE-300) INJECTION 61%
75.0000 mL | Freq: Once | INTRAVENOUS | Status: AC | PRN
  Administered 2020-10-18: 75 mL via INTRAVENOUS

## 2020-10-22 ENCOUNTER — Other Ambulatory Visit: Payer: Self-pay | Admitting: Family Medicine

## 2020-10-25 DIAGNOSIS — M25569 Pain in unspecified knee: Secondary | ICD-10-CM | POA: Diagnosis not present

## 2020-10-25 DIAGNOSIS — M545 Low back pain, unspecified: Secondary | ICD-10-CM | POA: Diagnosis not present

## 2020-10-25 DIAGNOSIS — E1140 Type 2 diabetes mellitus with diabetic neuropathy, unspecified: Secondary | ICD-10-CM | POA: Diagnosis not present

## 2020-10-25 DIAGNOSIS — G43701 Chronic migraine without aura, not intractable, with status migrainosus: Secondary | ICD-10-CM | POA: Diagnosis not present

## 2020-10-25 DIAGNOSIS — Z79891 Long term (current) use of opiate analgesic: Secondary | ICD-10-CM | POA: Diagnosis not present

## 2020-10-25 DIAGNOSIS — M79606 Pain in leg, unspecified: Secondary | ICD-10-CM | POA: Diagnosis not present

## 2020-10-25 DIAGNOSIS — M542 Cervicalgia: Secondary | ICD-10-CM | POA: Diagnosis not present

## 2020-10-28 ENCOUNTER — Ambulatory Visit (INDEPENDENT_AMBULATORY_CARE_PROVIDER_SITE_OTHER): Payer: Medicaid Other | Admitting: Nurse Practitioner

## 2020-10-28 ENCOUNTER — Other Ambulatory Visit: Payer: Self-pay | Admitting: *Deleted

## 2020-10-28 ENCOUNTER — Telehealth: Payer: Self-pay

## 2020-10-28 ENCOUNTER — Encounter: Payer: Self-pay | Admitting: Nurse Practitioner

## 2020-10-28 ENCOUNTER — Other Ambulatory Visit: Payer: Self-pay

## 2020-10-28 VITALS — BP 147/85 | HR 80 | Temp 98.3°F | Resp 18 | Ht 65.0 in | Wt 229.0 lb

## 2020-10-28 DIAGNOSIS — H9202 Otalgia, left ear: Secondary | ICD-10-CM

## 2020-10-28 DIAGNOSIS — E119 Type 2 diabetes mellitus without complications: Secondary | ICD-10-CM

## 2020-10-28 DIAGNOSIS — E0865 Diabetes mellitus due to underlying condition with hyperglycemia: Secondary | ICD-10-CM

## 2020-10-28 DIAGNOSIS — E66813 Obesity, class 3: Secondary | ICD-10-CM

## 2020-10-28 DIAGNOSIS — Z6838 Body mass index (BMI) 38.0-38.9, adult: Secondary | ICD-10-CM | POA: Diagnosis not present

## 2020-10-28 DIAGNOSIS — Z794 Long term (current) use of insulin: Secondary | ICD-10-CM | POA: Diagnosis not present

## 2020-10-28 MED ORDER — NOVOLIN 70/30 RELION (70-30) 100 UNIT/ML ~~LOC~~ SUSP: 60.0000 [IU] | Freq: Two times a day (BID) | SUBCUTANEOUS | 2 refills | Status: DC

## 2020-10-28 MED ORDER — APIXABAN 5 MG PO TABS: 5.0000 mg | ORAL_TABLET | Freq: Two times a day (BID) | ORAL | 5 refills | Status: DC

## 2020-10-28 NOTE — Assessment & Plan Note (Signed)
-  was recently drained -ENT deferred oral steroids d/t poor glycemic control -CBG 318, so too high to add steroids today

## 2020-10-28 NOTE — Progress Notes (Signed)
Acute Office Visit  Subjective:    Patient ID: Sonya James, female    DOB: Jan 01, 1966, 55 y.o.   MRN: [id number]  Chief Complaint  Patient presents with  . Ear Pain    L ear - cyst removal by [facility] x 3 mo. Ago     HPI Patient is in today for left ear swelling. From her ENT on 10/19/20: "Called patient to discuss results of recent CT neck with contrast performed on 10/18/2020. Patient was informed that CT was reviewed and demonstrated left auricular/preauricular soft tissue thickening without a drainable fluid collection or evidence of residual cystic lesion. Patient stated that the swelling has gotten somewhat worse, and she is also noted a mild amount of malodorous drainage. I advised patient that I would send a prescription for oral antibiotics, as well as Diflucan. I also counseled patient that she would likely benefit from a course of oral steroids for the edema, however I would like her to discuss this with her primary care provider prior to starting the medication, due to her history of type 2 diabetes. She expressed understanding, states that she will touch base with her PCP."  She does not regularly check her blood sugar, and she is not sure what her numbers have been lately.    Past Medical History:  Diagnosis Date  . Anemia   . Anxiety   . Arthritis   . Asthma   . Asthma 05/25/2013  . Autonomic neuropathy   . Cancer of endometrium ([place]) 10/18/2015  . Constipation   . COPD (chronic obstructive pulmonary disease) ([place])   . CTS (carpal tunnel syndrome)   . Depression   . Diabetes mellitus without complication ([place])   . Dysphagia 12/14/2016  . Encounter for screening colonoscopy   . Endometrial cancer ([place]) 2017  . Gastritis without bleeding   . GERD (gastroesophageal reflux disease)   . Gross hematuria 05/11/2019  . Headache(784.0)   . Heavy menses 10/11/2015  . HTN (hypertension)   . Hypercholesterolemia   . Hypothyroidism   . IBS (irritable bowel syndrome)   .  Osteoarthritis    osteoarthritis  . Pancreatitis    per patient   . Recurrent boils    buttocks and low back.  . S/P laparoscopic hysterectomy 11/04/2015  . Shortness of breath   . Sleep apnea    CPAP machine  . Uncontrolled type 2 diabetes mellitus with diabetic polyneuropathy, with long-term current use of insulin ([place]) 08/04/2017  . Uterine cancer ([place]) 2017  . Vitamin D deficiency     Past Surgical History:  Procedure Laterality Date  . ABDOMINAL HYSTERECTOMY  2018   endometrial cancer, surgery [facility]   . BIOPSY  01/02/2017   Procedure: BIOPSY;  Surgeon: Danie Binder, MD;  Location: [facility];  Service: Endoscopy;;  gastric biopsy  . CESAREAN SECTION  1989  . COLONOSCOPY WITH PROPOFOL N/A 01/02/2017   Dr. Oneida Alar: redundant colon, two 2-3 mm polyps in sigmoid colon (hyperplastic)  . ESOPHAGOGASTRODUODENOSCOPY  05/22/2011   Dr. Oneida Alar: H.pylori gastritis   . ESOPHAGOGASTRODUODENOSCOPY (EGD) WITH PROPOFOL N/A 01/02/2017   Dr. 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 FOOT SURGERY Left    tendon repair  . OOPHORECTOMY    . POLYPECTOMY  01/02/2017   Procedure: POLYPECTOMY;  Surgeon: Danie Binder, MD;  Location: [facility];  Service: Endoscopy;;  sigmoid colon polyps times 2  . REDUCTION MAMMAPLASTY  [place] N/A 01/02/2017   Procedure: SAVORY DILATION;  Surgeon: Danie Binder, MD;  Location: [facility];  Service: Endoscopy;  Laterality: N/A;  . TUBAL LIGATION        Family History  Problem Relation Age of Onset  . Diabetes Mother   . Hypertension Mother   . COPD Mother   . Asthma Mother   . Hypercholesterolemia Mother   . Hypertension Sister   . Hyperlipidemia Sister   . Diabetes Sister   . Asthma Brother   . Alcohol abuse Brother   . Asthma Son   . Cancer Maternal Grandmother        lung, throat, breast  . Alzheimer's disease Maternal Grandmother   . Hypertension  Maternal Grandmother   . Hypercholesterolemia Maternal Grandmother   . Heart disease Maternal Grandfather   . Stroke Maternal Grandfather   . Clotting disorder Maternal Grandfather        blood clots in legs  . Hypertension Sister   . Hyperlipidemia Sister   . Stroke Maternal Aunt   . Clotting disorder Maternal Aunt        blood clots in legs  . Hypertension Maternal Aunt   . Hypercholesterolemia Maternal Aunt   . Hypertension Maternal Aunt   . Asthma Maternal Aunt   . Clotting disorder Maternal Uncle        blood clot -legs traveled to heart and he died of heart attack  . Colon cancer Neg Hx     Social History   Socioeconomic History  . Marital status: Married    Spouse name: Not on file  . Number of children: 1  . Years of education: Not on file  . Highest education level: Not on file  Occupational History  . Not on file  Tobacco Use  . Smoking status: Current Some Day Smoker    Packs/day: 0.50    Years: 22.00    Pack years: 11.00    Types: Cigarettes  . Smokeless tobacco: Never Used  Vaping Use  . Vaping Use: Never used  Substance and Sexual Activity  . Alcohol use: No  . Drug use: No  . Sexual activity: Yes    Birth control/protection: Surgical    Comment: hyst  Other Topics Concern  . Not on file  Social History Narrative   Lives with husband and she has been married to for 9 years.  Recently got evicted and has been living in a motel since last July.   She has 1 son that lives in [place].  Reports one grandbaby      Enjoys watching TV mostly animal [place].      Diet: Eats all food groups   Caffeine: Soda not diet, green tea, coffee about 3 cups of caffeine per day.   Water: Drinks 5-6 8 ounce bottles throughout the day.      Wears a seatbelt.  Currently does not have a vehicle.  Smoke detectors in the facility she is living in.  Does not have any weapons.   Social Determinants of Health   Financial Resource Strain: Low Risk   . Difficulty of  Paying Living Expenses: Not hard at all  Food Insecurity: No Food Insecurity  . Worried About Charity fundraiser in the Last Year: Never true  . Ran Out of Food in the Last Year: Never true  Transportation Needs: Unmet Transportation Needs  . Lack of Transportation (Medical): Yes  . Lack of Transportation (Non-Medical): Yes  Physical Activity: Insufficiently Active  . Days of Exercise per Week: 3 days  . Minutes of Exercise per Session: 20 min  Stress: No Stress Concern Present  . Feeling of Stress : Not at all  Social Connections: Moderately Isolated  . Frequency of Communication with Friends and Family: More than three times a week  . Frequency of Social Gatherings with Friends and Family: More than three times a week  . Attends Religious Services: 1 to 4 times per year  . Active Member of Clubs or Organizations: No  . Attends Banker Meetings: Never  . Marital Status: Separated  Intimate Partner Violence: Not At Risk  . Fear of Current or Ex-Partner: No  . Emotionally Abused: No  . Physically Abused: No  . Sexually Abused: No    Outpatient Medications Prior to Visit  Medication Sig Dispense Refill  . albuterol (PROVENTIL) (2.5 MG/3ML) 0.083% nebulizer solution Take 3 mLs (2.5 mg total) by nebulization every 6 (six) hours as needed for wheezing. 75 mL 12  . albuterol (VENTOLIN HFA) 108 (90 Base) MCG/ACT inhaler Inhale 2 puffs into the lungs every 6 (six) hours as needed for wheezing or shortness of breath. 18 g 5  . apixaban (ELIQUIS) 5 MG TABS tablet Take 1 tablet (5 mg total) by mouth 2 (two) times daily. On 08/12/17 @ 9PM, start 1 tab (5 mg) two times daily. 60 tablet 5  . atorvastatin (LIPITOR) 80 MG tablet Take 1 tablet (80 mg total) by mouth daily. 90 tablet 0  . Blood Glucose Monitoring Suppl (ACCU-CHEK GUIDE ME) w/Device KIT 1 Piece by Does not apply route as directed. 1 kit 0  . esomeprazole (NEXIUM) 40 MG capsule Take 1 capsule (40 mg total) by mouth 2 (two)  times daily before a meal. 180 capsule 3  . furosemide (LASIX) 40 MG tablet Take 1 tablet (40 mg total) by mouth daily. 30 tablet 5  . glipiZIDE (GLUCOTROL XL) 5 MG 24 hr tablet Take 1 tablet by mouth once daily with breakfast 90 tablet 0  . glucose blood (ACCU-CHEK GUIDE) test strip Use as instructed 150 each 2  . hydrocortisone (ANUSOL-HC) 2.5 % rectal cream Place 1 application rectally 2 (two) times daily. As needed for itching 30 g 1  . linaclotide (LINZESS) 290 MCG CAPS capsule Take 1 capsule (290 mcg total) by mouth daily before breakfast. 90 capsule 3  . lisinopril (ZESTRIL) 40 MG tablet Take 1 tablet by mouth once daily 30 tablet 0  . metFORMIN (GLUCOPHAGE) 1000 MG tablet TAKE 1 TABLET BY MOUTH TWICE DAILY WITH A MEAL 60 tablet 0  . methocarbamol (ROBAXIN) 500 MG tablet Take 1 tablet (500 mg total) by mouth 2 (two) times daily. 20 tablet 0  . montelukast (SINGULAIR) 10 MG tablet Take 1 tablet (10 mg total) by mouth at bedtime. 30 tablet 5  . ofloxacin (FLOXIN) 0.3 % OTIC solution Place 5 drops into the left ear 2 (two) times daily. 5 mL 0  . ondansetron (ZOFRAN-ODT) 4 MG disintegrating tablet Take 4 mg by mouth as needed for nausea or vomiting.     . potassium chloride (KLOR-CON) 10 MEQ tablet Take 1 tablet (10 mEq total) by mouth every evening. 30 tablet 5  . silver sulfADIAZINE (SILVADENE) 1 % cream Apply 1 application topically daily. 50 g 0  . Vitamin D, Ergocalciferol, (DRISDOL) 1.25 MG (50000 UNIT) CAPS capsule Take 1 capsule (50,000 Units total) by mouth every 7 (seven) days. 4 capsule 2  .  insulin NPH-regular Human (NOVOLIN 70/30 RELION) (70-30) 100 UNIT/ML injection Inject 50 Units into the skin 2 (two) times daily with a meal. 30 mL 2   No facility-administered medications prior to visit.    No Known Allergies  Review of Systems  Constitutional: Negative.   HENT: Positive for ear discharge, ear pain and facial swelling.        Objective:    Physical  Exam Constitutional:      Appearance: She is obese.  HENT:     Ears:     Comments: Left external ear is painful to palpation and swollen Neurological:     Mental Status: She is alert.     BP (!) 147/85   Pulse 80   Temp 98.3 F (36.8 C)   Resp 18   Ht $R'5\' 5"'Ab$  (1.651 m)   Wt 229 lb (103.9 kg)   LMP 09/20/2015 (Exact Date)   SpO2 95%   BMI 38.11 kg/m  Wt Readings from Last 3 Encounters:  10/28/20 229 lb (103.9 kg)  10/05/20 263 lb (119.3 kg)  08/16/20 230 lb (104.3 kg)    Health Maintenance Due  Topic Date Due  . Hepatitis C Screening  Never done  . HEMOGLOBIN A1C  07/22/2020  . PAP SMEAR-Modifier  12/26/2020    There are no preventive care reminders to display for this patient.   Lab Results  Component Value Date   TSH 1.426 04/19/2020   Lab Results  Component Value Date   WBC 7.8 02/11/2020   HGB 13.8 02/11/2020   HCT 42.4 02/11/2020   MCV 95.7 02/11/2020   PLT 308 02/11/2020   Lab Results  Component Value Date   NA 139 04/19/2020   K 3.9 04/19/2020   CO2 24 04/19/2020   GLUCOSE 180 (H) 04/19/2020   BUN 9 04/19/2020   CREATININE 0.86 04/19/2020   BILITOT 0.8 04/19/2020   ALKPHOS 118 04/19/2020   AST 21 04/19/2020   ALT 19 04/19/2020   PROT 8.5 (H) 04/19/2020   ALBUMIN 4.4 04/19/2020   CALCIUM 10.4 (H) 04/19/2020   ANIONGAP 9 04/19/2020   Lab Results  Component Value Date   CHOL 210 (H) 04/19/2020   Lab Results  Component Value Date   HDL 40 (L) 04/19/2020   Lab Results  Component Value Date   LDLCALC 151 (H) 04/19/2020   Lab Results  Component Value Date   TRIG 96 04/19/2020   Lab Results  Component Value Date   CHOLHDL 5.3 04/19/2020   Lab Results  Component Value Date   HGBA1C 8.9 (A) 01/20/2020       Assessment & Plan:   Problem List Items Addressed This Visit      Endocrine   Diabetes mellitus ([place]) - Primary    -her ENT didn't prescribe oral steroids d/t potential hyperglycemia -CBG today 318 -doesn't check blood  sugars because it hurts her fingers -INCREASE her 70/30 to 60 units BID, she was encouraged to follow up with Dr. Dorris Fetch -she was uncertain if she was going back to see Dr. Dorris Fetch      Relevant Medications   insulin NPH-regular Human (NOVOLIN 70/30 RELION) (70-30) 100 UNIT/ML injection   Other Relevant Orders   CBC with Differential/Platelet   CMP14+EGFR   Hemoglobin A1c   Lipid Panel With LDL/HDL Ratio   Microalbumin / creatinine urine ratio     Other   Obesity, Class III, BMI 40-49.9 (morbid obesity) ([place])    Wt Readings from Last 3 Encounters:  10/28/20 229 lb (103.9 kg)  10/05/20 263 lb (119.3 kg)  08/16/20 230 lb (104.3 kg)  -fluctuating weights recently; she states that she changed her diet recently      Relevant Medications   insulin NPH-regular Human (NOVOLIN 70/30 RELION) (70-30) 100 UNIT/ML injection   Left ear pain    -was recently drained -ENT deferred oral steroids d/t poor glycemic control -CBG 318, so too high to add steroids today          Meds ordered this encounter  Medications  . insulin NPH-regular Human (NOVOLIN 70/30 RELION) (70-30) 100 UNIT/ML injection    Sig: Inject 60 Units into the skin 2 (two) times daily with a meal.    Dispense:  30 mL    Refill:  2     Noreene Larsson, NP

## 2020-10-28 NOTE — Telephone Encounter (Signed)
Patient called need med refill apixaban (ELIQUIS) 5 MG  Pharmacy: [facility]

## 2020-10-28 NOTE — Telephone Encounter (Signed)
Sent to pt pharmacy

## 2020-10-28 NOTE — Assessment & Plan Note (Addendum)
-  her ENT didn't prescribe oral steroids d/t potential hyperglycemia -CBG today 318 -doesn't check blood sugars because it hurts her fingers -INCREASE her 70/30 to 60 units BID, she was encouraged to follow up with Dr. Dorris Fetch -she was uncertain if she was going back to see Dr. Dorris Fetch

## 2020-10-28 NOTE — Patient Instructions (Addendum)
You lost weight since your last visit. You were 263 pounds, but today you are at 229.  For your blood sugar, increase your 70/30 to 60 units twice per day with meals. We will meet back up in 2 weeks to check your blood sugar. Please keep logs of your blood sugar at least twice per day.  Get fasting labs drawn 2-3 days prior to your next visit.

## 2020-10-28 NOTE — Assessment & Plan Note (Signed)
Wt Readings from Last 3 Encounters:  10/28/20 229 lb (103.9 kg)  10/05/20 263 lb (119.3 kg)  08/16/20 230 lb (104.3 kg)  -fluctuating weights recently; she states that she changed her diet recently

## 2020-11-01 DIAGNOSIS — Z794 Long term (current) use of insulin: Secondary | ICD-10-CM | POA: Diagnosis not present

## 2020-11-01 DIAGNOSIS — E0865 Diabetes mellitus due to underlying condition with hyperglycemia: Secondary | ICD-10-CM | POA: Diagnosis not present

## 2020-11-02 ENCOUNTER — Other Ambulatory Visit: Payer: Self-pay | Admitting: Nurse Practitioner

## 2020-11-02 NOTE — Progress Notes (Signed)
Added parathyroid hormone and vit D to her labs because her calcium is elevated.

## 2020-11-03 ENCOUNTER — Other Ambulatory Visit: Payer: Self-pay | Admitting: Nurse Practitioner

## 2020-11-03 LAB — CBC WITH DIFFERENTIAL/PLATELET
Basophils Absolute: 0 10*3/uL (ref 0.0–0.2)
Basos: 0 %
EOS (ABSOLUTE): 0.2 10*3/uL (ref 0.0–0.4)
Eos: 2 %
Hematocrit: 47.4 % — ABNORMAL HIGH (ref 34.0–46.6)
Hemoglobin: 15.6 g/dL (ref 11.1–15.9)
Immature Grans (Abs): 0.1 10*3/uL (ref 0.0–0.1)
Immature Granulocytes: 1 %
Lymphocytes Absolute: 4.3 10*3/uL — ABNORMAL HIGH (ref 0.7–3.1)
Lymphs: 41 %
MCH: 30.2 pg (ref 26.6–33.0)
MCHC: 32.9 g/dL (ref 31.5–35.7)
MCV: 92 fL (ref 79–97)
Monocytes Absolute: 0.5 10*3/uL (ref 0.1–0.9)
Monocytes: 4 %
Neutrophils Absolute: 5.4 10*3/uL (ref 1.4–7.0)
Neutrophils: 52 %
Platelets: 388 10*3/uL (ref 150–450)
RBC: 5.16 x10E6/uL (ref 3.77–5.28)
RDW: 11.4 % — ABNORMAL LOW (ref 11.7–15.4)
WBC: 10.5 10*3/uL (ref 3.4–10.8)

## 2020-11-03 LAB — CMP14+EGFR
ALT: 12 IU/L (ref 0–32)
AST: 8 IU/L (ref 0–40)
Albumin/Globulin Ratio: 1.5 (ref 1.2–2.2)
Albumin: 4.4 g/dL (ref 3.8–4.9)
Alkaline Phosphatase: 133 IU/L — ABNORMAL HIGH (ref 44–121)
BUN/Creatinine Ratio: 16 (ref 9–23)
BUN: 16 mg/dL (ref 6–24)
Bilirubin Total: 0.3 mg/dL (ref 0.0–1.2)
CO2: 20 mmol/L (ref 20–29)
Calcium: 11.2 mg/dL — ABNORMAL HIGH (ref 8.7–10.2)
Chloride: 101 mmol/L (ref 96–106)
Creatinine, Ser: 1 mg/dL (ref 0.57–1.00)
GFR calc Af Amer: 73 mL/min/{1.73_m2} (ref >59)
GFR calc non Af Amer: 64 mL/min/{1.73_m2} (ref >59)
Globulin, Total: 2.9 g/dL (ref 1.5–4.5)
Glucose: 222 mg/dL — ABNORMAL HIGH (ref 65–99)
Potassium: 4.1 mmol/L (ref 3.5–5.2)
Sodium: 137 mmol/L (ref 134–144)
Total Protein: 7.3 g/dL (ref 6.0–8.5)

## 2020-11-03 LAB — MICROALBUMIN / CREATININE URINE RATIO

## 2020-11-03 LAB — HEMOGLOBIN A1C
Est. average glucose Bld gHb Est-mCnc: 303 mg/dL
Hgb A1c MFr Bld: 12.2 % — ABNORMAL HIGH (ref 4.8–5.6)

## 2020-11-03 LAB — LIPID PANEL WITH LDL/HDL RATIO
Cholesterol, Total: 206 mg/dL — ABNORMAL HIGH (ref 100–199)
HDL: 42 mg/dL (ref >39)
LDL Chol Calc (NIH): 136 mg/dL — ABNORMAL HIGH (ref 0–99)
LDL/HDL Ratio: 3.2 ratio (ref 0.0–3.2)
Triglycerides: 155 mg/dL — ABNORMAL HIGH (ref 0–149)
VLDL Cholesterol Cal: 28 mg/dL (ref 5–40)

## 2020-11-03 LAB — VITAMIN D 25 HYDROXY (VIT D DEFICIENCY, FRACTURES): Vit D, 25-Hydroxy: 18.7 ng/mL — ABNORMAL LOW (ref 30.0–100.0)

## 2020-11-03 LAB — SPECIMEN STATUS REPORT

## 2020-11-03 MED ORDER — VITAMIN D (ERGOCALCIFEROL) 1.25 MG (50000 UNIT) PO CAPS: 50000.0000 [IU] | ORAL_CAPSULE | ORAL | 0 refills | Status: DC

## 2020-11-03 NOTE — Progress Notes (Signed)
Can we let her know that, and we will collect it at her appointment on 3/3.

## 2020-11-03 NOTE — Progress Notes (Signed)
I see the Vit D results, but not the PTH. Did this get added?

## 2020-11-03 NOTE — Progress Notes (Signed)
Sent it to [facility]!

## 2020-11-11 ENCOUNTER — Other Ambulatory Visit: Payer: Self-pay

## 2020-11-11 ENCOUNTER — Encounter: Payer: Self-pay | Admitting: Nurse Practitioner

## 2020-11-11 ENCOUNTER — Ambulatory Visit (INDEPENDENT_AMBULATORY_CARE_PROVIDER_SITE_OTHER): Payer: Medicaid Other | Admitting: Nurse Practitioner

## 2020-11-11 VITALS — BP 148/80 | HR 76 | Temp 97.8°F | Resp 20 | Ht 65.0 in | Wt 239.0 lb

## 2020-11-11 DIAGNOSIS — E1169 Type 2 diabetes mellitus with other specified complication: Secondary | ICD-10-CM

## 2020-11-11 DIAGNOSIS — L989 Disorder of the skin and subcutaneous tissue, unspecified: Secondary | ICD-10-CM

## 2020-11-11 NOTE — Addendum Note (Signed)
Addended by: Lonn Georgia on: 11/11/2020 01:19 PM   Modules accepted: Orders

## 2020-11-11 NOTE — Progress Notes (Signed)
Acute Office Visit  Subjective:    Patient ID: Sonya James, female    DOB: [birth year], 55 y.o.   MRN: [id number]  Chief Complaint  Patient presents with  . Diabetes    Needs repeat thyroid panel as well.     HPI Patient is in today for follow-up for hypercalcemia. She was unable to get PTH added to her last labs, so we will draw labs today.  She states she has a mole on her left shoulder that is changing and growing.  She went to a dermatologist, but they didn't accept medicaid, so she was turned away.    Past Medical History:  Diagnosis Date  . Anemia   . Anxiety   . Arthritis   . Asthma   . Asthma 05/25/2013  . Autonomic neuropathy   . Cancer of endometrium ([place]) 10/18/2015  . Constipation   . COPD (chronic obstructive pulmonary disease) ([place])   . CTS (carpal tunnel syndrome)   . Depression   . Diabetes mellitus without complication ([place])   . Dysphagia 12/14/2016  . Encounter for screening colonoscopy   . Endometrial cancer ([place]) 2017  . Gastritis without bleeding   . GERD (gastroesophageal reflux disease)   . Gross hematuria 05/11/2019  . Headache(784.0)   . Heavy menses 10/11/2015  . HTN (hypertension)   . Hypercholesterolemia   . Hypothyroidism   . IBS (irritable bowel syndrome)   . Osteoarthritis    osteoarthritis  . Pancreatitis    per patient   . Recurrent boils    buttocks and low back.  . S/P laparoscopic hysterectomy 11/04/2015  . Shortness of breath   . Sleep apnea    CPAP machine  . Uncontrolled type 2 diabetes mellitus with diabetic polyneuropathy, with long-term current use of insulin ([place]) 08/04/2017  . Uterine cancer ([place]) 2017  . Vitamin D deficiency     Past Surgical History:  Procedure Laterality Date  . ABDOMINAL HYSTERECTOMY  2018   endometrial cancer, surgery done at [facility]   . BIOPSY  01/02/2017   Procedure: BIOPSY;  Surgeon: Danie Binder, MD;  [facility];  Service: Endoscopy;;  gastric biopsy  . CESAREAN SECTION   1989  . COLONOSCOPY WITH PROPOFOL N/A 01/02/2017   Dr. Oneida Alar: redundant colon, two 2-3 mm polyps in sigmoid colon (hyperplastic)  . ESOPHAGOGASTRODUODENOSCOPY  05/22/2011   Dr. Oneida Alar: H.pylori gastritis   . ESOPHAGOGASTRODUODENOSCOPY (EGD) WITH PROPOFOL N/A 01/02/2017   Dr. 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 FOOT SURGERY Left    tendon repair  . OOPHORECTOMY    . POLYPECTOMY  01/02/2017   Procedure: POLYPECTOMY;  Surgeon: Danie Binder, MD;  [facility];  Service: Endoscopy;;  sigmoid colon polyps times 2  . REDUCTION MAMMAPLASTY  1996  . SAVORY DILATION N/A 01/02/2017   Procedure: SAVORY DILATION;  Surgeon: Danie Binder, MD;  Location[facility];  Service: Endoscopy;  Laterality: N/A;  . TUBAL LIGATION      Family History  Problem Relation Age of Onset  . Diabetes Mother   . Hypertension Mother   . COPD Mother   . Asthma Mother   . Hypercholesterolemia Mother   . Hypertension Sister   . Hyperlipidemia Sister   . Diabetes Sister   . Asthma Brother   . Alcohol abuse Brother   . Asthma Son   . Cancer Maternal Grandmother  lung, throat, breast  . Alzheimer's disease Maternal Grandmother   . Hypertension Maternal Grandmother   . Hypercholesterolemia Maternal Grandmother   . Heart disease Maternal Grandfather   . Stroke Maternal Grandfather   . Clotting disorder Maternal Grandfather        blood clots in legs  . Hypertension Sister   . Hyperlipidemia Sister   . Stroke Maternal Aunt   . Clotting disorder Maternal Aunt        blood clots in legs  . Hypertension Maternal Aunt   . Hypercholesterolemia Maternal Aunt   . Hypertension Maternal Aunt   . Asthma Maternal Aunt   . Clotting disorder Maternal Uncle        blood clot -legs traveled to heart and he died of heart attack  . Colon cancer Neg Hx     Social History   Socioeconomic History  . Marital status: Married    Spouse name:  Not on file  . Number of children: 1  . Years of education: Not on file  . Highest education level: Not on file  Occupational History  . Not on file  Tobacco Use  . Smoking status: Current Some Day Smoker    Packs/day: 0.50    Years: 22.00    Pack years: 11.00    Types: Cigarettes  . Smokeless tobacco: Never Used  Vaping Use  . Vaping Use: Never used  Substance and Sexual Activity  . Alcohol use: No  . Drug use: No  . Sexual activity: Yes    Birth control/protection: Surgical    Comment: hyst  Other Topics Concern  . Not on file  Social History Narrative   Lives with husband and she has been married to for 9 years.  Recently got evicted and has been living in a motel since last July.   She has 1 son that lives in [place].  Reports one grandbaby      Enjoys watching TV mostly animal [place].      Diet: Eats all food groups   Caffeine: Soda not diet, green tea, coffee about 3 cups of caffeine per day.   Water: Drinks 5-6 8 ounce bottles throughout the day.      Wears a seatbelt.  Currently does not have a vehicle.  Smoke detectors in the facility she is living in.  Does not have any weapons.   Social Determinants of Health   Financial Resource Strain: Low Risk   . Difficulty of Paying Living Expenses: Not hard at all  Food Insecurity: No Food Insecurity  . Worried About Charity fundraiser in the Last Year: Never true  . Ran Out of Food in the Last Year: Never true  Transportation Needs: Unmet Transportation Needs  . Lack of Transportation (Medical): Yes  . Lack of Transportation (Non-Medical): Yes  Physical Activity: Insufficiently Active  . Days of Exercise per Week: 3 days  . Minutes of Exercise per Session: 20 min  Stress: No Stress Concern Present  . Feeling of Stress : Not at all  Social Connections: Moderately Isolated  . Frequency of Communication with Friends and Family: More than three times a week  . Frequency of Social Gatherings with Friends and  Family: More than three times a week  . Attends Religious Services: 1 to 4 times per year  . Active Member of Clubs or Organizations: No  . Attends Archivist Meetings: Never  . Marital Status: Separated  Intimate Partner Violence: Not At Risk  .  Fear of Current or Ex-Partner: No  . Emotionally Abused: No  . Physically Abused: No  . Sexually Abused: No    Outpatient Medications Prior to Visit  Medication Sig Dispense Refill  . albuterol (PROVENTIL) (2.5 MG/3ML) 0.083% nebulizer solution Take 3 mLs (2.5 mg total) by nebulization every 6 (six) hours as needed for wheezing. 75 mL 12  . albuterol (VENTOLIN HFA) 108 (90 Base) MCG/ACT inhaler Inhale 2 puffs into the lungs every 6 (six) hours as needed for wheezing or shortness of breath. 18 g 5  . apixaban (ELIQUIS) 5 MG TABS tablet Take 1 tablet (5 mg total) by mouth 2 (two) times daily. On 08/12/17 @ 9PM, start 1 tab (5 mg) two times daily. 60 tablet 5  . atorvastatin (LIPITOR) 80 MG tablet Take 1 tablet (80 mg total) by mouth daily. 90 tablet 0  . Blood Glucose Monitoring Suppl (ACCU-CHEK GUIDE ME) w/Device KIT 1 Piece by Does not apply route as directed. 1 kit 0  . esomeprazole (NEXIUM) 40 MG capsule Take 1 capsule (40 mg total) by mouth 2 (two) times daily before a meal. 180 capsule 3  . furosemide (LASIX) 40 MG tablet Take 1 tablet (40 mg total) by mouth daily. 30 tablet 5  . glipiZIDE (GLUCOTROL XL) 5 MG 24 hr tablet Take 1 tablet by mouth once daily with breakfast 90 tablet 0  . glucose blood (ACCU-CHEK GUIDE) test strip Use as instructed 150 each 2  . hydrocortisone (ANUSOL-HC) 2.5 % rectal cream Place 1 application rectally 2 (two) times daily. As needed for itching 30 g 1  . insulin NPH-regular Human (NOVOLIN 70/30 RELION) (70-30) 100 UNIT/ML injection Inject 60 Units into the skin 2 (two) times daily with a meal. 30 mL 2  . linaclotide (LINZESS) 290 MCG CAPS capsule Take 1 capsule (290 mcg total) by mouth daily before  breakfast. 90 capsule 3  . lisinopril (ZESTRIL) 40 MG tablet Take 1 tablet by mouth once daily 30 tablet 0  . metFORMIN (GLUCOPHAGE) 1000 MG tablet TAKE 1 TABLET BY MOUTH TWICE DAILY WITH A MEAL 60 tablet 0  . methocarbamol (ROBAXIN) 500 MG tablet Take 1 tablet (500 mg total) by mouth 2 (two) times daily. 20 tablet 0  . montelukast (SINGULAIR) 10 MG tablet Take 1 tablet (10 mg total) by mouth at bedtime. 30 tablet 5  . ofloxacin (FLOXIN) 0.3 % OTIC solution Place 5 drops into the left ear 2 (two) times daily. 5 mL 0  . ondansetron (ZOFRAN-ODT) 4 MG disintegrating tablet Take 4 mg by mouth as needed for nausea or vomiting.     . potassium chloride (KLOR-CON) 10 MEQ tablet Take 1 tablet (10 mEq total) by mouth every evening. 30 tablet 5  . silver sulfADIAZINE (SILVADENE) 1 % cream Apply 1 application topically daily. 50 g 0  . Vitamin D, Ergocalciferol, (DRISDOL) 1.25 MG (50000 UNIT) CAPS capsule Take 1 capsule (50,000 Units total) by mouth every 7 (seven) days. 8 capsule 0   No facility-administered medications prior to visit.    No Known Allergies  Review of Systems  Constitutional: Negative.   Skin:       Mole to left shoulder that is growing       Objective:    Physical Exam Constitutional:      Appearance: She is obese.  Skin:    Comments: Left shoulder has hyperkeratinized, hyperpigmented lesion  Neurological:     Mental Status: She is alert.     BP (!) 148/80  Pulse 76   Temp 97.8 F (36.6 C)   Resp 20   Ht _0  (1.651 m)   Wt 239 lb (108.4 kg)   LMP 09/20/2015 (Exact Date)   SpO2 98%   BMI 39.77 kg/m  Wt Readings from Last 3 Encounters:  11/11/20 239 lb (108.4 kg)  10/28/20 229 lb (103.9 kg)  10/05/20 263 lb (119.3 kg)    Health Maintenance Due  Topic Date Due  . Hepatitis C Screening  Never done  . PAP SMEAR-Modifier  12/26/2020    There are no preventive care reminders to display for this patient.   Lab Results  Component Value Date   TSH 1.426  04/19/2020   Lab Results  Component Value Date   WBC 10.5 11/01/2020   HGB 15.6 11/01/2020   HCT 47.4 (H) 11/01/2020   MCV 92 11/01/2020   PLT 388 11/01/2020   Lab Results  Component Value Date   NA 137 11/01/2020   K 4.1 11/01/2020   CO2 20 11/01/2020   GLUCOSE 222 (H) 11/01/2020   BUN 16 11/01/2020   CREATININE 1.00 11/01/2020   BILITOT 0.3 11/01/2020   ALKPHOS 133 (H) 11/01/2020   AST 8 11/01/2020   ALT 12 11/01/2020   PROT 7.3 11/01/2020   ALBUMIN 4.4 11/01/2020   CALCIUM 11.2 (H) 11/01/2020   ANIONGAP 9 04/19/2020   Lab Results  Component Value Date   CHOL 206 (H) 11/01/2020   Lab Results  Component Value Date   HDL 42 11/01/2020   Lab Results  Component Value Date   LDLCALC 136 (H) 11/01/2020   Lab Results  Component Value Date   TRIG 155 (H) 11/01/2020   Lab Results  Component Value Date   CHOLHDL 5.3 04/19/2020   Lab Results  Component Value Date   HGBA1C 12.2 (H) 11/01/2020       Assessment & Plan:   Problem List Items Addressed This Visit      Musculoskeletal and Integument   Skin lesion - Primary    -referral to derm for left shoulder hyperkeratinized, hyperpigmented lesion      Relevant Orders   Ambulatory referral to Dermatology     Other   Hypercalcemia    -last Vit D was low, but Ca elevated -Will drawn PTH and calcium today      Relevant Orders   PTH, Intact and Calcium       No orders of the defined types were placed in this encounter.    Noreene Larsson, NP

## 2020-11-11 NOTE — Assessment & Plan Note (Signed)
-  last Vit D was low, but Ca elevated -Will drawn PTH and calcium today

## 2020-11-11 NOTE — Assessment & Plan Note (Signed)
-  referral to derm for left shoulder hyperkeratinized, hyperpigmented lesion

## 2020-11-12 ENCOUNTER — Other Ambulatory Visit: Payer: Self-pay | Admitting: Nurse Practitioner

## 2020-11-12 DIAGNOSIS — E213 Hyperparathyroidism, unspecified: Secondary | ICD-10-CM

## 2020-11-12 LAB — PTH, INTACT AND CALCIUM
Calcium: 9.7 mg/dL (ref 8.7–10.2)
PTH: 82 pg/mL — ABNORMAL HIGH (ref 15–65)

## 2020-11-12 NOTE — Progress Notes (Signed)
Her parathyroid hormone was slightly elevated at 82 (high end of normal is 65). I sent a referral to endocrinology for this.

## 2020-11-13 LAB — MICROALBUMIN, URINE: Microalbumin, Urine: 4.3 ug/mL

## 2020-11-16 ENCOUNTER — Encounter: Payer: Self-pay | Admitting: "Endocrinology

## 2020-11-16 ENCOUNTER — Encounter: Payer: Self-pay | Admitting: Orthopaedic Surgery

## 2020-11-16 ENCOUNTER — Other Ambulatory Visit: Payer: Self-pay

## 2020-11-16 ENCOUNTER — Ambulatory Visit (INDEPENDENT_AMBULATORY_CARE_PROVIDER_SITE_OTHER): Payer: Medicaid Other | Admitting: "Endocrinology

## 2020-11-16 ENCOUNTER — Ambulatory Visit (INDEPENDENT_AMBULATORY_CARE_PROVIDER_SITE_OTHER): Payer: Medicaid Other | Admitting: Orthopaedic Surgery

## 2020-11-16 VITALS — BP 141/76 | HR 70 | Ht 65.0 in | Wt 241.6 lb

## 2020-11-16 DIAGNOSIS — E1169 Type 2 diabetes mellitus with other specified complication: Secondary | ICD-10-CM

## 2020-11-16 DIAGNOSIS — M25562 Pain in left knee: Secondary | ICD-10-CM

## 2020-11-16 DIAGNOSIS — E559 Vitamin D deficiency, unspecified: Secondary | ICD-10-CM

## 2020-11-16 DIAGNOSIS — I10 Essential (primary) hypertension: Secondary | ICD-10-CM

## 2020-11-16 DIAGNOSIS — Z9119 Patient's noncompliance with other medical treatment and regimen: Secondary | ICD-10-CM

## 2020-11-16 DIAGNOSIS — M25561 Pain in right knee: Secondary | ICD-10-CM | POA: Diagnosis not present

## 2020-11-16 DIAGNOSIS — E782 Mixed hyperlipidemia: Secondary | ICD-10-CM | POA: Diagnosis not present

## 2020-11-16 DIAGNOSIS — G8929 Other chronic pain: Secondary | ICD-10-CM | POA: Diagnosis not present

## 2020-11-16 DIAGNOSIS — Z794 Long term (current) use of insulin: Secondary | ICD-10-CM

## 2020-11-16 DIAGNOSIS — Z91199 Patient's noncompliance with other medical treatment and regimen due to unspecified reason: Secondary | ICD-10-CM

## 2020-11-16 MED ORDER — METFORMIN HCL 1000 MG PO TABS: 1000.0000 mg | ORAL_TABLET | Freq: Two times a day (BID) | ORAL | 2 refills | Status: DC

## 2020-11-16 MED ORDER — ACCU-CHEK GUIDE VI STRP: ORAL_STRIP | 2 refills | Status: DC

## 2020-11-16 MED ORDER — GLIPIZIDE ER 5 MG PO TB24: 5.0000 mg | ORAL_TABLET | Freq: Every day | ORAL | 1 refills | Status: DC

## 2020-11-16 MED ORDER — ACCU-CHEK GUIDE ME W/DEVICE KIT: 1.0000 | PACK | 0 refills | Status: AC

## 2020-11-16 MED ORDER — NOVOLIN 70/30 RELION (70-30) 100 UNIT/ML ~~LOC~~ SUSP: 60.0000 [IU] | Freq: Two times a day (BID) | SUBCUTANEOUS | 2 refills | Status: DC

## 2020-11-16 NOTE — Progress Notes (Signed)
11/16/2020, 5:23 PM  Endocrinology follow-up note   Subjective:    Patient ID: Sonya James, female    DOB: 07-May-1966.  Sonya James is being seen in follow-up after she was seen in consultation for management of currently uncontrolled symptomatic diabetes requested by  Perlie Mayo, NP.   Past Medical History:  Diagnosis Date  . Anemia   . Anxiety   . Arthritis   . Asthma   . Asthma 05/25/2013  . Autonomic neuropathy   . Cancer of endometrium ([place]) 10/18/2015  . Constipation   . COPD (chronic obstructive pulmonary disease) ([place])   . CTS (carpal tunnel syndrome)   . Depression   . Diabetes mellitus without complication ([place])   . Dysphagia 12/14/2016  . Encounter for screening colonoscopy   . Endometrial cancer ([place]) 2017  . Gastritis without bleeding   . GERD (gastroesophageal reflux disease)   . Gross hematuria 05/11/2019  . Headache(784.0)   . Heavy menses 10/11/2015  . HTN (hypertension)   . Hypercholesterolemia   . Hypothyroidism   . IBS (irritable bowel syndrome)   . Osteoarthritis    osteoarthritis  . Pancreatitis    per patient   . Recurrent boils    buttocks and low back.  . S/P laparoscopic hysterectomy 11/04/2015  . Shortness of breath   . Sleep apnea    CPAP machine  . Uncontrolled type 2 diabetes mellitus with diabetic polyneuropathy, with long-term current use of insulin ([place]) 08/04/2017  . Uterine cancer ([place]) 2017  . Vitamin D deficiency     Past Surgical History:  Procedure Laterality Date  . ABDOMINAL HYSTERECTOMY  2018   endometrial cancer, surgery done at [facility]   . BIOPSY  01/02/2017   Procedure: BIOPSY;  Surgeon: Danie Binder, MD;  Location[facility];  Service: Endoscopy;;  gastric biopsy  . CESAREAN SECTION  1989  . COLONOSCOPY WITH PROPOFOL N/A 01/02/2017   Dr. Oneida Alar: redundant colon, two 2-3 mm polyps in sigmoid colon (hyperplastic)  .  ESOPHAGOGASTRODUODENOSCOPY  05/22/2011   Dr. Oneida Alar: H.pylori gastritis   . ESOPHAGOGASTRODUODENOSCOPY (EGD) WITH PROPOFOL N/A 01/02/2017   Dr. 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 FOOT SURGERY Left    tendon repair  . OOPHORECTOMY    . POLYPECTOMY  01/02/2017   Procedure: POLYPECTOMY;  Surgeon: Danie Binder, MD;  Location[facility];  Service: Endoscopy;;  sigmoid colon polyps times 2  . REDUCTION MAMMAPLASTY  1996  . SAVORY DILATION N/A 01/02/2017   Procedure: SAVORY DILATION;  Surgeon: Danie Binder, MD;  Location[facility];  Service: Endoscopy;  Laterality: N/A;  . TUBAL LIGATION      Social History   Socioeconomic History  . Marital status: Married    Spouse name: Not on file  . Number of children: 1  . Years of education: Not on file  . Highest education level: Not on file  Occupational History  . Not on file  Tobacco Use  . Smoking status: Current Some Day Smoker    Packs/day: 0.50    Years:  22.00    Pack years: 11.00    Types: Cigarettes  . Smokeless tobacco: Never Used  Vaping Use  . Vaping Use: Never used  Substance and Sexual Activity  . Alcohol use: No  . Drug use: No  . Sexual activity: Yes    Birth control/protection: Surgical    Comment: hyst  Other Topics Concern  . Not on file  Social History Narrative   Lives with husband and she has been married to for 9 years.  Recently got evicted and has been living in a motel since last July.   She has 1 son that lives in [place].  Reports one grandbaby      Enjoys watching TV mostly [place].      Diet: Eats all food groups   Caffeine: Soda not diet, green tea, coffee about 3 cups of caffeine per day.   Water: Drinks 5-6 8 ounce bottles throughout the day.      Wears a seatbelt.  Currently does not have a vehicle.  Smoke detectors in the facility she is living in.  Does not have any weapons.   Social Determinants of Health    Financial Resource Strain: Low Risk   . Difficulty of Paying Living Expenses: Not hard at all  Food Insecurity: No Food Insecurity  . Worried About Running Out of Food in the Last Year: Never true  . Ran Out of Food in the Last Year: Never true  Transportation Needs: Unmet Transportation Needs  . Lack of Transportation (Medical): Yes  . Lack of Transportation (Non-Medical): Yes  Physical Activity: Insufficiently Active  . Days of Exercise per Week: 3 days  . Minutes of Exercise per Session: 20 min  Stress: No Stress Concern Present  . Feeling of Stress : Not at all  Social Connections: Moderately Isolated  . Frequency of Communication with Friends and Family: More than three times a week  . Frequency of Social Gatherings with Friends and Family: More than three times a week  . Attends Religious Services: 1 to 4 times per year  . Active Member of Clubs or Organizations: No  . Attends Club or Organization Meetings: Never  . Marital Status: Separated    Family History  Problem Relation Age of Onset  . Diabetes Mother   . Hypertension Mother   . COPD Mother   . Asthma Mother   . Hypercholesterolemia Mother   . Hypertension Sister   . Hyperlipidemia Sister   . Diabetes Sister   . Asthma Brother   . Alcohol abuse Brother   . Asthma Son   . Cancer Maternal Grandmother        lung, throat, breast  . Alzheimer's disease Maternal Grandmother   . Hypertension Maternal Grandmother   . Hypercholesterolemia Maternal Grandmother   . Heart disease Maternal Grandfather   . Stroke Maternal Grandfather   . Clotting disorder Maternal Grandfather        blood clots in legs  . Hypertension Sister   . Hyperlipidemia Sister   . Stroke Maternal Aunt   . Clotting disorder Maternal Aunt        blood clots in legs  . Hypertension Maternal Aunt   . Hypercholesterolemia Maternal Aunt   . Hypertension Maternal Aunt   . Asthma Maternal Aunt   . Clotting disorder Maternal Uncle        blood  clot -legs traveled to heart and he died of heart attack  . Colon cancer Neg Hx       Outpatient Encounter Medications as of 11/16/2020  Medication Sig  . albuterol (PROVENTIL) (2.5 MG/3ML) 0.083% nebulizer solution Take 3 mLs (2.5 mg total) by nebulization every 6 (six) hours as needed for wheezing.  [name] albuterol (VENTOLIN HFA) 108 (90 Base) MCG/ACT inhaler Inhale 2 puffs into the lungs every 6 (six) hours as needed for wheezing or shortness of breath.  [name] apixaban (ELIQUIS) 5 MG TABS tablet Take 1 tablet (5 mg total) by mouth 2 (two) times daily. On 08/12/17 @ 9PM, start 1 tab (5 mg) two times daily.  [name] atorvastatin (LIPITOR) 80 MG tablet Take 1 tablet (80 mg total) by mouth daily.  . Blood Glucose Monitoring Suppl (ACCU-CHEK GUIDE ME) w/Device KIT 1 Piece by Does not apply route as directed.  [name] esomeprazole (NEXIUM) 40 MG capsule Take 1 capsule (40 mg total) by mouth 2 (two) times daily before a meal.  . furosemide (LASIX) 40 MG tablet Take 1 tablet (40 mg total) by mouth daily.  [name] glipiZIDE (GLUCOTROL XL) 5 MG 24 hr tablet Take 1 tablet (5 mg total) by mouth daily with breakfast.  . glucose blood (ACCU-CHEK GUIDE) test strip Test glucose 4 times a day  . hydrocortisone (ANUSOL-HC) 2.5 % rectal cream Place 1 application rectally 2 (two) times daily. As needed for itching  . insulin NPH-regular Human (NOVOLIN 70/30 RELION) (70-30) 100 UNIT/ML injection Inject 60 Units into the skin 2 (two) times daily with a meal.  . linaclotide (LINZESS) 290 MCG CAPS capsule Take 1 capsule (290 mcg total) by mouth daily before breakfast.  . lisinopril (ZESTRIL) 40 MG tablet Take 1 tablet by mouth once daily  . metFORMIN (GLUCOPHAGE) 1000 MG tablet Take 1 tablet (1,000 mg total) by mouth 2 (two) times daily with a meal.  . methocarbamol (ROBAXIN) 500 MG tablet Take 1 tablet (500 mg total) by mouth 2 (two) times daily.  . montelukast (SINGULAIR) 10 MG tablet Take 1 tablet (10 mg total) by mouth at bedtime.  [name]  ofloxacin (FLOXIN) 0.3 % OTIC solution Place 5 drops into the left ear 2 (two) times daily.  . ondansetron (ZOFRAN-ODT) 4 MG disintegrating tablet Take 4 mg by mouth as needed for nausea or vomiting.   . potassium chloride (KLOR-CON) 10 MEQ tablet Take 1 tablet (10 mEq total) by mouth every evening.  . silver sulfADIAZINE (SILVADENE) 1 % cream Apply 1 application topically daily.  . Vitamin D, Ergocalciferol, (DRISDOL) 1.25 MG (50000 UNIT) CAPS capsule Take 1 capsule (50,000 Units total) by mouth every 7 (seven) days.  . [DISCONTINUED] Blood Glucose Monitoring Suppl (ACCU-CHEK GUIDE ME) w/Device KIT 1 Piece by Does not apply route as directed.  . [DISCONTINUED] glipiZIDE (GLUCOTROL XL) 5 MG 24 hr tablet Take 1 tablet by mouth once daily with breakfast  . [DISCONTINUED] glucose blood (ACCU-CHEK GUIDE) test strip Use as instructed  . [DISCONTINUED] insulin NPH-regular Human (NOVOLIN 70/30 RELION) (70-30) 100 UNIT/ML injection Inject 60 Units into the skin 2 (two) times daily with a meal.  . [DISCONTINUED] metFORMIN (GLUCOPHAGE) 1000 MG tablet TAKE 1 TABLET BY MOUTH TWICE DAILY WITH A MEAL   No facility-administered encounter medications on file as of 11/16/2020.    ALLERGIES: No Known Allergies  VACCINATION STATUS: Immunization History  Administered Date(s) Administered  . Influenza,inj,Quad PF,6+ Mos 07/04/2014, 11/05/2015, 05/22/2019, 06/08/2020  . Moderna Sars-Covid-2 Vaccination 12/04/2019, 12/27/2019, 07/03/2020  . Pneumococcal Polysaccharide-23 07/04/2014, 11/05/2015, 01/29/2018  . Tdap 01/29/2020  . Unspecified SARS-COV-2 Vaccination 12/04/2019    Diabetes She presents for her  follow-up diabetic visit. She has type 2 diabetes mellitus. Onset time: She was diagnosed at approximate age of 37 years. Her disease course has been worsening. There are no hypoglycemic associated symptoms. Pertinent negatives for hypoglycemia include no confusion, headaches, pallor or seizures. Associated  symptoms include fatigue, polydipsia and polyuria. Pertinent negatives for diabetes include no chest pain and no polyphagia. There are no hypoglycemic complications. Symptoms are worsening. Diabetic complications include peripheral neuropathy and PVD. Risk factors for coronary artery disease include diabetes mellitus, dyslipidemia, family history, obesity, hypertension, tobacco exposure, sedentary lifestyle and post-menopausal. Current diabetic treatment includes insulin injections and oral agent (monotherapy) (She is currently on Novolin 70/30 100 units 3 times a day, Jardiance 25 mg daily, Metformin 1000 mg p.o. twice daily.). Her weight is increasing steadily. She is following a generally unhealthy diet. When asked about meal planning, she reported none. She has not had a previous visit with a dietitian. She never participates in exercise. Her home blood glucose trend is increasing steadily. (After missing her appointment since August 2021, she returns with no meter nor logs.  Her previsit labs show A1c of 12.2% increasing from 8.9%.  -  She denies hypoglycemia. ) An ACE inhibitor/angiotensin II receptor blocker is being taken.  Hyperlipidemia This is a chronic problem. The current episode started more than 1 year ago. The problem is uncontrolled. Exacerbating diseases include diabetes and obesity. Pertinent negatives include no chest pain, myalgias or shortness of breath. Current antihyperlipidemic treatment includes statins. Risk factors for coronary artery disease include diabetes mellitus, dyslipidemia, hypertension, family history, obesity, a sedentary lifestyle and post-menopausal.  Hypertension This is a chronic problem. The current episode started more than 1 year ago. The problem is uncontrolled. Pertinent negatives include no chest pain, headaches, palpitations or shortness of breath. Risk factors for coronary artery disease include diabetes mellitus, dyslipidemia, family history, obesity,  post-menopausal state, sedentary lifestyle and smoking/tobacco exposure. Hypertensive end-organ damage includes PVD.     Review of Systems  Constitutional: Positive for fatigue. Negative for chills, fever and unexpected weight change.  HENT: Negative for trouble swallowing and voice change.   Eyes: Negative for visual disturbance.  Respiratory: Negative for cough, shortness of breath and wheezing.   Cardiovascular: Negative for chest pain, palpitations and leg swelling.  Gastrointestinal: Negative for diarrhea, nausea and vomiting.  Endocrine: Positive for polydipsia and polyuria. Negative for cold intolerance, heat intolerance and polyphagia.  Musculoskeletal: Negative for arthralgias and myalgias.  Skin: Negative for color change, pallor, rash and wound.  Neurological: Negative for seizures and headaches.  Psychiatric/Behavioral: Negative for confusion and suicidal ideas.    Objective:    Vitals with BMI 11/16/2020 11/11/2020 10/28/2020  Height $Remov'5\' 5"'zGkfrA$  $Remove'5\' 5"'KCDxuzT$  $RemoveB'5\' 5"'XuwdwKBR$   Weight 241 lbs 10 oz 239 lbs 229 lbs  BMI 40.2 32.99 24.26  Systolic 834 196 222  Diastolic 76 80 85  Pulse 70 76 80    BP (!) 141/76   Pulse 70   Ht $R'5\' 5"'St$  (1.651 m)   Wt 241 lb 9.6 oz (109.6 kg)   LMP 09/20/2015 (Exact Date)   BMI 40.20 kg/m   Wt Readings from Last 3 Encounters:  11/16/20 241 lb 9.6 oz (109.6 kg)  11/11/20 239 lb (108.4 kg)  10/28/20 229 lb (103.9 kg)     Physical Exam Constitutional:      Appearance: She is well-developed.  HENT:     Head: Normocephalic and atraumatic.  Neck:     Thyroid: No thyromegaly.     Trachea:  No tracheal deviation.  Cardiovascular:     Comments: Diminished pulses on dorsalis pedis and posterior tibial arteries. Pulmonary:     Effort: Pulmonary effort is normal.  Abdominal:     Tenderness: There is no abdominal tenderness. There is no guarding.  Musculoskeletal:        General: Normal range of motion.     Cervical back: Normal range of motion and neck supple.   Skin:    General: Skin is warm and dry.     Coloration: Skin is not pale.     Findings: No erythema or rash.     Comments: Diffuse tattoos.  Neurological:     General: No focal deficit present.     Mental Status: She is alert and oriented to person, place, and time.     Cranial Nerves: No cranial nerve deficit.     Coordination: Coordination normal.     Deep Tendon Reflexes: Reflexes are normal and symmetric.  Psychiatric:     Comments: Reluctant, noncompliant affect.      CMP ( most recent) CMP     Component Value Date/Time   NA 137 11/01/2020 0845   K 4.1 11/01/2020 0845   CL 101 11/01/2020 0845   CO2 20 11/01/2020 0845   GLUCOSE 222 (H) 11/01/2020 0845   GLUCOSE 180 (H) 04/19/2020 1159   BUN 16 11/01/2020 0845   CREATININE 1.00 11/01/2020 0845   CREATININE 0.83 12/17/2019 0946   CALCIUM 9.7 11/11/2020 0902   PROT 7.3 11/01/2020 0845   ALBUMIN 4.4 11/01/2020 0845   AST 8 11/01/2020 0845   ALT 12 11/01/2020 0845   ALKPHOS 133 (H) 11/01/2020 0845   BILITOT 0.3 11/01/2020 0845   GFRNONAA 64 11/01/2020 0845   GFRNONAA 80 12/17/2019 0946   GFRAA 73 11/01/2020 0845   GFRAA 93 12/17/2019 0946     Diabetic Labs (most recent): Lab Results  Component Value Date   HGBA1C 12.2 (H) 11/01/2020   HGBA1C 8.9 (A) 01/20/2020   HGBA1C 9.7 (H) 08/27/2019     Lipid Panel ( most recent) Lipid Panel     Component Value Date/Time   CHOL 206 (H) 11/01/2020 0845   TRIG 155 (H) 11/01/2020 0845   HDL 42 11/01/2020 0845   CHOLHDL 5.3 04/19/2020 1159   VLDL 19 04/19/2020 1159   LDLCALC 136 (H) 11/01/2020 0845   LDLCALC 171 (H) 12/17/2019 0946   LABVLDL 28 11/01/2020 0845      Lab Results  Component Value Date   TSH 1.426 04/19/2020   TSH 2.507 08/27/2019   TSH 0.641 05/26/2013   FREET4 0.87 04/19/2020   FREET4 0.99 05/26/2013      Assessment & Plan:   1. Type 2 diabetes mellitus with other specified complication, with long-term current use of insulin ([place])   -  Sonya James has currently uncontrolled symptomatic type 2 DM since  55 years of age. After missing her appointment since August 2021, she returns with no meter nor logs.  Her previsit labs show A1c of 12.2% increasing from 8.9%.  -  She denies hypoglycemia. Her recent labs were discussed and reviewed with her. - I had a long discussion with her about the progressive nature of diabetes and the pathology behind its complications. -Patient is alarmingly noncompliant for monitoring and documenting care. -her diabetes is complicated by peripheral neuropathy, peripheral arterial disease, chronic heavy smoking, obesity/sedentary life and she remains at a high risk for more acute and chronic complications which include CAD, CVA,  CKD, retinopathy, and neuropathy. These are all discussed in detail with her.  - I have counseled her on diet  and weight management  by adopting a carbohydrate restricted/protein rich diet. Patient is encouraged to switch to  unprocessed or minimally processed     complex starch and increased protein intake (animal or plant source), fruits, and vegetables. -  she is advised to stick to a routine mealtimes to eat 3 meals  a day and avoid unnecessary snacks ( to snack only to correct hypoglycemia).   - she acknowledges that there is a room for improvement in her food and drink choices. - Suggestion is made for her to avoid simple carbohydrates  from her diet including Cakes, Sweet Desserts, Ice Cream, Soda (diet and regular), Sweet Tea, Candies, Chips, Cookies, Store Bought Juices, Alcohol in Excess of  1-2 drinks a day, Artificial Sweeteners,  Coffee Creamer, and "Sugar-free" Products, Lemonade. This will help patient to have more stable blood glucose profile and potentially avoid unintended weight gain.   - she has been scheduled with Jearld Fenton, RDN, CDE for diabetes education.  - I have approached her with the following individualized plan to manage  her diabetes and  patient agrees:   -Patient will likely need intensive treatment with basal/bolus insulin to control her diabetes to target.  However, she is not engaged enough for proper monitoring of blood glucose for safe use of insulin. -She will be kept on the premixed insulin Novolin 57/32 for simplicity reasons. -She is approached to start strict monitoring of blood glucose 4 times a day-daily before meals and at bedtime.  -Since she has not been monitoring blood glucose, it is impossible to give her a safe adjustment in her dose of insulin.   -She is advised to increase her Novolin 70/30 to 60  units with breakfast and 60 units with supper  only when her Premeal readings are greater than 90 mg per DL. - she is warned not to take insulin without proper monitoring per orders. -New testing supplies were prescribed for her.  - she is encouraged to call clinic for blood glucose levels less than 70 or above 300 mg /dl. - she is advised to continue Metformin 1000 mg p.o. twice daily, therapeutically suitable for patient . -She is advised to continue glipizide 5 mg XL p.o. daily at breakfast.  -She is too high risk to consider incretin therapy due to her severe dyslipidemia and heavy chronic smoking-high risk for pancreatitis.  - Specific targets for  A1c;  LDL, HDL,  and Triglycerides were discussed with the patient.  2) Blood Pressure /Hypertension:  -Her blood pressure is not controlled to target.   She is advised to be consistent with her medications including lisinopril 40 mg p.o. daily.  She also has Lasix as needed. 3) Lipids/Hyperlipidemia:   Review of her recent lipid panel showed uncontrolled  LDL at 171 .  She is advised to continue Lipitor 80 mg p.o. nightly.     Side effects and precautions discussed with her.  4)  Weight/Diet:  Body mass index is 40.2 kg/m.  -   clearly complicating her diabetes care.   she is  a candidate for weight loss. I discussed with her the fact that loss of 5 - 10% of her   current body weight will have the most impact on her diabetes management.  Exercise, and detailed carbohydrates information provided  -  detailed on discharge instructions.  5) Chronic Care/Health Maintenance:  -she  is on ACEI/ARB and Statin medications and  is encouraged to initiate and continue to follow up with Ophthalmology, Dentist,  Podiatrist at least yearly or according to recommendations, and advised to  quit smoking. I have recommended yearly flu vaccine and pneumonia vaccine at least every 5 years; moderate intensity exercise for up to 150 minutes weekly; and  sleep for at least 7 hours a day.  The patient was counseled on the dangers of tobacco use, and was advised to quit.  Reviewed strategies to maximize success, including removing cigarettes and smoking materials from environment.  POCT ABI Results 11/16/20  Her screening ABI today was normal.   Right ABI: 1.12      left ABI: 1.12  Right leg systolic / diastolic: 625/63 mmHg Left leg systolic / diastolic: 893/73 mmHg  Arm systolic / diastolic: 428/76 mmHG Repeat study will be repeated in March 2027, or sooner if needed.  - she is  advised to maintain close follow up with Perlie Mayo, NP for primary care needs, as well as her other providers for optimal and coordinated care.  - Time spent on this patient care encounter:  45 min, of which > 50% was spent in  counseling and the rest reviewing her blood glucose logs , discussing her hypoglycemia and hyperglycemia episodes, reviewing her current and  previous labs / studies  ( including abstraction from other facilities) and medications  doses and developing a  long term treatment plan and documenting her care.   Please refer to Patient Instructions for Blood Glucose Monitoring and Insulin/Medications Dosing Guide"  in media tab for additional information. Please  also refer to " Patient Self Inventory" in the Media  tab for reviewed elements of pertinent patient history.  Sonya James participated in the discussions, expressed understanding, and voiced agreement with the above plans.  All questions were answered to her satisfaction. she is encouraged to contact clinic should she have any questions or concerns prior to her return visit.   Follow up plan: - Return in about 1 week (around 11/23/2020) for F/U with Meter and Logs Only - no Labs.  Glade Lloyd, MD [facility] [address] [place], [place] [address] Phone: [phone]  Fax: [phone]    11/16/2020, 5:23 PM  This note was partially dictated with voice recognition software. Similar sounding words can be transcribed inadequately or may not  be corrected upon review.

## 2020-11-16 NOTE — Progress Notes (Signed)
CC: Both of my knees are hurting. I would like an injection in both knees.  The patient has had chronic pain and tenderness of both knees for some time.  Injections help.  There is no locking or giving way of the knee.  There is no new trauma. There is no redness or signs of infections.  The knees have a mild effusion and some crepitus.  There is no redness or signs of recent trauma.  Right knee ROM is 0 to 105  and left knee ROM is 0 to 110.  Impression:  Chronic pain of the both knees  Return:  6 weeks  PROCEDURE NOTE:  The patient requests injections of both knees, verbal consent was obtained.  The left and right knee were individually prepped appropriately after time out was performed.   Sterile technique was observed and injection of 1 cc of Celestone 6 mg with several cc's of plain xylocaine. Anesthesia was provided by ethyl chloride and a 20-gauge needle was used to inject each knee area. The injections were tolerated well.  A band aid dressing was applied.  The patient was advised to apply ice later today and tomorrow to the injection sight as needed.  Return in six weeks.  Electronically Signed Sanjuana Kava, MD 3/8/20228:59 AM

## 2020-11-16 NOTE — Patient Instructions (Signed)
                                     Advice for Weight Management  -For most of us the best way to lose weight is by diet management. Generally speaking, diet management means consuming less calories intentionally which over time brings about progressive weight loss.  This can be achieved more effectively by restricting carbohydrate consumption to the minimum possible.  So, it is critically important to know your numbers: how much calorie you are consuming and how much calorie you need. More importantly, our carbohydrates sources should be unprocessed or minimally processed complex starch food items.   Sometimes, it is important to balance nutrition by increasing protein intake (animal or plant source), fruits, and vegetables.  -Sticking to a routine mealtime to eat 3 meals a day and avoiding unnecessary snacks is shown to have a big role in weight control. Under normal circumstances, the only time we lose real weight is when we are hungry, so allow hunger to take place- hunger means no food between meal times, only water.  It is not advisable to starve.   -It is better to avoid simple carbohydrates including: Cakes, Sweet Desserts, Ice Cream, Soda (diet and regular), Sweet Tea, Candies, Chips, Cookies, Store Bought Juices, Alcohol in Excess of  1-2 drinks a day, Lemonade,  Artificial Sweeteners, Doughnuts, Coffee Creamers, "Sugar-free" Products, etc, etc.  This is not a complete list.....    -Consulting with certified diabetes educators is proven to provide you with the most accurate and current information on diet.  Also, you may be  interested in discussing diet options/exchanges , we can schedule a visit with Sonya James, RDN, CDE for individualized nutrition education.  -Exercise: If you are able: 30 -60 minutes a day ,4 days a week, or 150 minutes a week.  The longer the better.  Combine stretch, strength, and aerobic activities.  If you were told in the  past that you have high risk for cardiovascular diseases, you may seek evaluation by your heart doctor prior to initiating moderate to intense exercise programs.                                  Additional Care Considerations for Diabetes   -Diabetes  is a chronic disease.  The most important care consideration is regular follow-up with your diabetes care provider with the goal being avoiding or delaying its complications and to take advantage of advances in medications and technology.    -Type 2 diabetes is known to coexist with other important comorbidities such as high blood pressure and high cholesterol.  It is critical to control not only the diabetes but also the high blood pressure and high cholesterol to minimize and delay the risk of complications including coronary artery disease, stroke, amputations, blindness, etc.    - Studies showed that people with diabetes will benefit from a class of medications known as ACE inhibitors and statins.  Unless there are specific reasons not to be on these medications, the standard of care is to consider getting one from these groups of medications at an optimal doses.  These medications are generally considered safe and proven to help protect the heart and the kidneys.    - People with diabetes are encouraged to initiate and maintain regular follow-up with eye doctors, foot   doctors, dentists , and if necessary heart and kidney doctors.     - It is highly recommended that people with diabetes quit smoking or stay away from smoking, and get yearly  flu vaccine and pneumonia vaccine at least every 5 years.  One other important lifestyle recommendation is to ensure adequate sleep - at least 6-7 hours of uninterrupted sleep at night.  -Exercise: If you are able: 30 -60 minutes a day, 4 days a week, or 150 minutes a week.  The longer the better.  Combine stretch, strength, and aerobic activities.  If you were told in the past that you have high risk for  cardiovascular diseases, you may seek evaluation by your heart doctor prior to initiating moderate to intense exercise programs.          

## 2020-11-17 ENCOUNTER — Other Ambulatory Visit: Payer: Self-pay | Admitting: "Endocrinology

## 2020-11-17 MED ORDER — ACCU-CHEK GUIDE VI STRP: ORAL_STRIP | 2 refills | Status: AC

## 2020-11-17 MED ORDER — ACCU-CHEK SOFTCLIX LANCETS MISC
2 refills | Status: AC
Start: 2020-11-17 — End: ?

## 2020-11-17 MED ORDER — BD PEN NEEDLE SHORT U/F 31G X 8 MM MISC: 2 refills | Status: DC

## 2020-11-22 ENCOUNTER — Other Ambulatory Visit: Payer: Self-pay | Admitting: Internal Medicine

## 2020-11-22 ENCOUNTER — Other Ambulatory Visit: Payer: Self-pay | Admitting: Nurse Practitioner

## 2020-11-23 ENCOUNTER — Ambulatory Visit (INDEPENDENT_AMBULATORY_CARE_PROVIDER_SITE_OTHER): Payer: Medicaid Other | Admitting: "Endocrinology

## 2020-11-23 ENCOUNTER — Other Ambulatory Visit: Payer: Self-pay

## 2020-11-23 ENCOUNTER — Encounter: Payer: Self-pay | Admitting: "Endocrinology

## 2020-11-23 VITALS — BP 156/84 | HR 60 | Ht 65.0 in | Wt 237.8 lb

## 2020-11-23 DIAGNOSIS — E1169 Type 2 diabetes mellitus with other specified complication: Secondary | ICD-10-CM

## 2020-11-23 DIAGNOSIS — Z794 Long term (current) use of insulin: Secondary | ICD-10-CM

## 2020-11-23 DIAGNOSIS — E782 Mixed hyperlipidemia: Secondary | ICD-10-CM

## 2020-11-23 DIAGNOSIS — E559 Vitamin D deficiency, unspecified: Secondary | ICD-10-CM | POA: Diagnosis not present

## 2020-11-23 DIAGNOSIS — I10 Essential (primary) hypertension: Secondary | ICD-10-CM | POA: Diagnosis not present

## 2020-11-23 NOTE — Patient Instructions (Signed)

## 2020-11-23 NOTE — Progress Notes (Signed)
11/23/2020, 2:09 PM  Endocrinology follow-up note   Subjective:    Patient ID: Sonya James, female    DOB: 1966/05/02.  Sonya James is being seen in follow-up after she was seen in consultation for management of currently uncontrolled symptomatic diabetes requested by  Perlie Mayo, NP.   Past Medical History:  Diagnosis Date  . Anemia   . Anxiety   . Arthritis   . Asthma   . Asthma 05/25/2013  . Autonomic neuropathy   . Cancer of endometrium ([place]) 10/18/2015  . Constipation   . COPD (chronic obstructive pulmonary disease) ([place])   . CTS (carpal tunnel syndrome)   . Depression   . Diabetes mellitus without complication ([place])   . Dysphagia 12/14/2016  . Encounter for screening colonoscopy   . Endometrial cancer ([place]) 2017  . Gastritis without bleeding   . GERD (gastroesophageal reflux disease)   . Gross hematuria 05/11/2019  . Headache(784.0)   . Heavy menses 10/11/2015  . HTN (hypertension)   . Hypercholesterolemia   . Hypothyroidism   . IBS (irritable bowel syndrome)   . Osteoarthritis    osteoarthritis  . Pancreatitis    per patient   . Recurrent boils    buttocks and low back.  . S/P laparoscopic hysterectomy 11/04/2015  . Shortness of breath   . Sleep apnea    CPAP machine  . Uncontrolled type 2 diabetes mellitus with diabetic polyneuropathy, with long-term current use of insulin ([place]) 08/04/2017  . Uterine cancer ([place]) 2017  . Vitamin D deficiency     Past Surgical History:  Procedure Laterality Date  . ABDOMINAL HYSTERECTOMY  2018   endometrial cancer, surgery done at [facility]   . BIOPSY  01/02/2017   Procedure: BIOPSY;  Surgeon: Danie Binder, MD;  Location: [facility];  Service: Endoscopy;;  gastric biopsy  . CESAREAN SECTION  1989  . COLONOSCOPY WITH PROPOFOL N/A 01/02/2017   Dr. Oneida Alar: redundant colon, two 2-3 mm polyps in sigmoid colon (hyperplastic)  .  ESOPHAGOGASTRODUODENOSCOPY  05/22/2011   Dr. Oneida Alar: H.pylori gastritis   . ESOPHAGOGASTRODUODENOSCOPY (EGD) WITH PROPOFOL N/A 01/02/2017   Dr. 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 FOOT SURGERY Left    tendon repair  . OOPHORECTOMY    . POLYPECTOMY  01/02/2017   Procedure: POLYPECTOMY;  Surgeon: Danie Binder, MD;  Location[facility];  Service: Endoscopy;;  sigmoid colon polyps times 2  . REDUCTION MAMMAPLASTY  1996  . SAVORY DILATION N/A 01/02/2017   Procedure: SAVORY DILATION;  Surgeon: Danie Binder, MD;  Location[facility];  Service: Endoscopy;  Laterality: N/A;  . TUBAL LIGATION      Social History   Socioeconomic History  . Marital status: Married    Spouse name: Not on file  . Number of children: 1  . Years of education: Not on file  . Highest education level: Not on file  Occupational History  . Not on file  Tobacco Use  . Smoking status: Current Some Day Smoker    Packs/day: 0.50    Years:  22.00    Pack years: 11.00    Types: Cigarettes  . Smokeless tobacco: Never Used  Vaping Use  . Vaping Use: Never used  Substance and Sexual Activity  . Alcohol use: No  . Drug use: No  . Sexual activity: Yes    Birth control/protection: Surgical    Comment: hyst  Other Topics Concern  . Not on file  Social History Narrative   Lives with husband and she has been married to for 9 years.  Recently got evicted and has been living in a motel since last July.   She has 1 son that lives in [place].  Reports one grandbaby      Enjoys watching TV mostly [place].      Diet: Eats all food groups   Caffeine: Soda not diet, green tea, coffee about 3 cups of caffeine per day.   Water: Drinks 5-6 8 ounce bottles throughout the day.      Wears a seatbelt.  Currently does not have a vehicle.  Smoke detectors in the facility she is living in.  Does not have any weapons.   Social Determinants of Health    Financial Resource Strain: Low Risk   . Difficulty of Paying Living Expenses: Not hard at all  Food Insecurity: No Food Insecurity  . Worried About Running Out of Food in the Last Year: Never true  . Ran Out of Food in the Last Year: Never true  Transportation Needs: Unmet Transportation Needs  . Lack of Transportation (Medical): Yes  . Lack of Transportation (Non-Medical): Yes  Physical Activity: Insufficiently Active  . Days of Exercise per Week: 3 days  . Minutes of Exercise per Session: 20 min  Stress: No Stress Concern Present  . Feeling of Stress : Not at all  Social Connections: Moderately Isolated  . Frequency of Communication with Friends and Family: More than three times a week  . Frequency of Social Gatherings with Friends and Family: More than three times a week  . Attends Religious Services: 1 to 4 times per year  . Active Member of Clubs or Organizations: No  . Attends Club or Organization Meetings: Never  . Marital Status: Separated    Family History  Problem Relation Age of Onset  . Diabetes Mother   . Hypertension Mother   . COPD Mother   . Asthma Mother   . Hypercholesterolemia Mother   . Hypertension Sister   . Hyperlipidemia Sister   . Diabetes Sister   . Asthma Brother   . Alcohol abuse Brother   . Asthma Son   . Cancer Maternal Grandmother        lung, throat, breast  . Alzheimer's disease Maternal Grandmother   . Hypertension Maternal Grandmother   . Hypercholesterolemia Maternal Grandmother   . Heart disease Maternal Grandfather   . Stroke Maternal Grandfather   . Clotting disorder Maternal Grandfather        blood clots in legs  . Hypertension Sister   . Hyperlipidemia Sister   . Stroke Maternal Aunt   . Clotting disorder Maternal Aunt        blood clots in legs  . Hypertension Maternal Aunt   . Hypercholesterolemia Maternal Aunt   . Hypertension Maternal Aunt   . Asthma Maternal Aunt   . Clotting disorder Maternal Uncle        blood  clot -legs traveled to heart and he died of heart attack  . Colon cancer Neg Hx       Outpatient Encounter Medications as of 11/23/2020  Medication Sig  . Accu-Chek Softclix Lancets lancets To test glucose 4 times a day  . albuterol (PROVENTIL) (2.5 MG/3ML) 0.083% nebulizer solution Take 3 mLs (2.5 mg total) by nebulization every 6 (six) hours as needed for wheezing.  . albuterol (VENTOLIN HFA) 108 (90 Base) MCG/ACT inhaler Inhale 2 puffs into the lungs every 6 (six) hours as needed for wheezing or shortness of breath.  . apixaban (ELIQUIS) 5 MG TABS tablet Take 1 tablet (5 mg total) by mouth 2 (two) times daily. On 08/12/17 @ 9PM, start 1 tab (5 mg) two times daily.  . atorvastatin (LIPITOR) 80 MG tablet Take 1 tablet (80 mg total) by mouth daily.  . Blood Glucose Monitoring Suppl (ACCU-CHEK GUIDE ME) w/Device KIT 1 Piece by Does not apply route as directed.  . esomeprazole (NEXIUM) 40 MG capsule Take 1 capsule (40 mg total) by mouth 2 (two) times daily before a meal.  . furosemide (LASIX) 40 MG tablet Take 1 tablet by mouth once daily  . glipiZIDE (GLUCOTROL XL) 5 MG 24 hr tablet Take 1 tablet (5 mg total) by mouth daily with breakfast.  . glucose blood (ACCU-CHEK GUIDE) test strip Test glucose 4 times a day  . hydrocortisone (ANUSOL-HC) 2.5 % rectal cream Place 1 application rectally 2 (two) times daily. As needed for itching  . insulin NPH-regular Human (NOVOLIN 70/30 RELION) (70-30) 100 UNIT/ML injection Inject 60 Units into the skin 2 (two) times daily with a meal.  . Insulin Pen Needle (B-D ULTRAFINE III SHORT PEN) 31G X 8 MM MISC Use to inject insulin 2 times daily.  . linaclotide (LINZESS) 290 MCG CAPS capsule Take 1 capsule (290 mcg total) by mouth daily before breakfast.  . lisinopril (ZESTRIL) 40 MG tablet Take 1 tablet by mouth once daily  . metFORMIN (GLUCOPHAGE) 1000 MG tablet Take 1 tablet (1,000 mg total) by mouth 2 (two) times daily with a meal.  . methocarbamol (ROBAXIN) 500 MG  tablet Take 1 tablet (500 mg total) by mouth 2 (two) times daily.  . montelukast (SINGULAIR) 10 MG tablet Take 1 tablet (10 mg total) by mouth at bedtime.  . ofloxacin (FLOXIN) 0.3 % OTIC solution Place 5 drops into the left ear 2 (two) times daily.  . ondansetron (ZOFRAN-ODT) 4 MG disintegrating tablet Take 4 mg by mouth as needed for nausea or vomiting.   . potassium chloride (KLOR-CON) 10 MEQ tablet TAKE 1 TABLET BY MOUTH ONCE DAILY IN THE EVENING  . silver sulfADIAZINE (SILVADENE) 1 % cream Apply 1 application topically daily.  . Vitamin D, Ergocalciferol, (DRISDOL) 1.25 MG (50000 UNIT) CAPS capsule TAKE 1 CAPSULE BY MOUTH ONCE A WEEK (EVERY 7 DAYS)   No facility-administered encounter medications on file as of 11/23/2020.    ALLERGIES: No Known Allergies  VACCINATION STATUS: Immunization History  Administered Date(s) Administered  . Influenza,inj,Quad PF,6+ Mos 07/04/2014, 11/05/2015, 05/22/2019, 06/08/2020  . Moderna Sars-Covid-2 Vaccination 12/04/2019, 12/27/2019, 07/03/2020  . Pneumococcal Polysaccharide-23 07/04/2014, 11/05/2015, 01/29/2018  . Tdap 01/29/2020  . Unspecified SARS-COV-2 Vaccination 12/04/2019    Diabetes She presents for her follow-up diabetic visit. She has type 2 diabetes mellitus. Onset time: She was diagnosed at approximate age of 40 years. Her disease course has been worsening. There are no hypoglycemic associated symptoms. Pertinent negatives for hypoglycemia include no confusion, headaches, pallor or seizures. Associated symptoms include fatigue, polydipsia and polyuria. Pertinent negatives for diabetes include no chest pain and no polyphagia. There are no hypoglycemic   complications. Symptoms are worsening. Diabetic complications include peripheral neuropathy and PVD. Risk factors for coronary artery disease include diabetes mellitus, dyslipidemia, family history, obesity, hypertension, tobacco exposure, sedentary lifestyle and post-menopausal. Current diabetic  treatment includes insulin injections and oral agent (monotherapy) (She is currently on Novolin 70/30 100 units 3 times a day, Jardiance 25 mg daily, Metformin 1000 mg p.o. twice daily.). Her weight is fluctuating minimally. She is following a generally unhealthy diet. When asked about meal planning, she reported none. She has not had a previous visit with a dietitian. She never participates in exercise. Her home blood glucose trend is increasing steadily. Her breakfast blood glucose range is generally >200 mg/dl. Her overall blood glucose range is >200 mg/dl. (She presents with a meter showing inadequate blood glucose measurement-0-1 blood glucose readings per day versus 4 times daily requested.  After missing her appointment since August 2021, she recently showed up with A1c of 12.2% increasing from 8.9%.  She did not document or report hypoglycemia.   ) An ACE inhibitor/angiotensin II receptor blocker is being taken.  Hyperlipidemia This is a chronic problem. The current episode started more than 1 year ago. The problem is uncontrolled. Exacerbating diseases include diabetes and obesity. Pertinent negatives include no chest pain, myalgias or shortness of breath. Current antihyperlipidemic treatment includes statins. Risk factors for coronary artery disease include diabetes mellitus, dyslipidemia, hypertension, family history, obesity, a sedentary lifestyle and post-menopausal.  Hypertension This is a chronic problem. The current episode started more than 1 year ago. The problem is uncontrolled. Pertinent negatives include no chest pain, headaches, palpitations or shortness of breath. Risk factors for coronary artery disease include diabetes mellitus, dyslipidemia, family history, obesity, post-menopausal state, sedentary lifestyle and smoking/tobacco exposure. Hypertensive end-organ damage includes PVD.     Review of Systems  Constitutional: Positive for fatigue. Negative for chills, fever and  unexpected weight change.  HENT: Negative for trouble swallowing and voice change.   Eyes: Negative for visual disturbance.  Respiratory: Negative for cough, shortness of breath and wheezing.   Cardiovascular: Negative for chest pain, palpitations and leg swelling.  Gastrointestinal: Negative for diarrhea, nausea and vomiting.  Endocrine: Positive for polydipsia and polyuria. Negative for cold intolerance, heat intolerance and polyphagia.  Musculoskeletal: Negative for arthralgias and myalgias.  Skin: Negative for color change, pallor, rash and wound.  Neurological: Negative for seizures and headaches.  Psychiatric/Behavioral: Negative for confusion and suicidal ideas.    Objective:    Vitals with BMI 11/23/2020 11/16/2020 11/11/2020  Height 5' 5" 5' 5" 5' 5"  Weight 237 lbs 13 oz 241 lbs 10 oz 239 lbs  BMI 39.57 40.2 39.77  Systolic 156 141 148  Diastolic 84 76 80  Pulse 60 70 76    BP (!) 156/84   Pulse 60   Ht 5' 5" (1.651 m)   Wt 237 lb 12.8 oz (107.9 kg)   LMP 09/20/2015 (Exact Date)   BMI 39.57 kg/m   Wt Readings from Last 3 Encounters:  11/23/20 237 lb 12.8 oz (107.9 kg)  11/16/20 241 lb 9.6 oz (109.6 kg)  11/11/20 239 lb (108.4 kg)     Physical Exam Constitutional:      Appearance: She is well-developed.  HENT:     Head: Normocephalic and atraumatic.  Neck:     Thyroid: No thyromegaly.     Trachea: No tracheal deviation.  Cardiovascular:     Comments: Diminished pulses on dorsalis pedis and posterior tibial arteries. Pulmonary:     Effort: Pulmonary   effort is normal.  Abdominal:     Tenderness: There is no abdominal tenderness. There is no guarding.  Musculoskeletal:        General: Normal range of motion.     Cervical back: Normal range of motion and neck supple.  Skin:    General: Skin is warm and dry.     Coloration: Skin is not pale.     Findings: No erythema or rash.     Comments: Diffuse tattoos.  Neurological:     General: No focal deficit  present.     Mental Status: She is alert and oriented to person, place, and time.     Cranial Nerves: No cranial nerve deficit.     Coordination: Coordination normal.     Deep Tendon Reflexes: Reflexes are normal and symmetric.  Psychiatric:     Comments: Reluctant, noncompliant affect.      CMP ( most recent) CMP     Component Value Date/Time   NA 137 11/01/2020 0845   K 4.1 11/01/2020 0845   CL 101 11/01/2020 0845   CO2 20 11/01/2020 0845   GLUCOSE 222 (H) 11/01/2020 0845   GLUCOSE 180 (H) 04/19/2020 1159   BUN 16 11/01/2020 0845   CREATININE 1.00 11/01/2020 0845   CREATININE 0.83 12/17/2019 0946   CALCIUM 9.7 11/11/2020 0902   PROT 7.3 11/01/2020 0845   ALBUMIN 4.4 11/01/2020 0845   AST 8 11/01/2020 0845   ALT 12 11/01/2020 0845   ALKPHOS 133 (H) 11/01/2020 0845   BILITOT 0.3 11/01/2020 0845   GFRNONAA 64 11/01/2020 0845   GFRNONAA 80 12/17/2019 0946   GFRAA 73 11/01/2020 0845   GFRAA 93 12/17/2019 0946     Diabetic Labs (most recent): Lab Results  Component Value Date   HGBA1C 12.2 (H) 11/01/2020   HGBA1C 8.9 (A) 01/20/2020   HGBA1C 9.7 (H) 08/27/2019     Lipid Panel ( most recent) Lipid Panel     Component Value Date/Time   CHOL 206 (H) 11/01/2020 0845   TRIG 155 (H) 11/01/2020 0845   HDL 42 11/01/2020 0845   CHOLHDL 5.3 04/19/2020 1159   VLDL 19 04/19/2020 1159   LDLCALC 136 (H) 11/01/2020 0845   LDLCALC 171 (H) 12/17/2019 0946   LABVLDL 28 11/01/2020 0845      Lab Results  Component Value Date   TSH 1.426 04/19/2020   TSH 2.507 08/27/2019   TSH 0.641 05/26/2013   FREET4 0.87 04/19/2020   FREET4 0.99 05/26/2013      Assessment & Plan:   1. Type 2 diabetes mellitus with other specified complication, with long-term current use of insulin (HCC)   - Gerard A Trautmann has currently uncontrolled symptomatic type 2 DM since  55 years of age.  She presents with a meter showing inadequate blood glucose measurement-0-1 blood glucose readings per  day versus 4 times daily requested.  After missing her appointment since August 2021, she recently showed up with A1c of 12.2% increasing from 8.9%.  She did not document or report hypoglycemia.    Her recent labs were discussed and reviewed with her. - I had a long discussion with her about the progressive nature of diabetes and the pathology behind its complications. -Patient is alarmingly noncompliant for monitoring and documenting care. -her diabetes is complicated by peripheral neuropathy, peripheral arterial disease, chronic heavy smoking, obesity/sedentary life and she remains at a high risk for more acute and chronic complications which include CAD, CVA, CKD, retinopathy, and neuropathy. These are   all discussed in detail with her.  - I have counseled her on diet  and weight management  by adopting a carbohydrate restricted/protein rich diet. Patient is encouraged to switch to  unprocessed or minimally processed     complex starch and increased protein intake (animal or plant source), fruits, and vegetables. -  she is advised to stick to a routine mealtimes to eat 3 meals  a day and avoid unnecessary snacks ( to snack only to correct hypoglycemia).   - she acknowledges that there is a room for improvement in her food and drink choices. - Suggestion is made for her to avoid simple carbohydrates  from her diet including Cakes, Sweet Desserts, Ice Cream, Soda (diet and regular), Sweet Tea, Candies, Chips, Cookies, Store Bought Juices, Alcohol in Excess of  1-2 drinks a day, Artificial Sweeteners,  Coffee Creamer, and "Sugar-free" Products, Lemonade. This will help patient to have more stable blood glucose profile and potentially avoid unintended weight gain.   - she has been scheduled with Jearld Fenton, RDN, CDE for diabetes education.  - I have approached her with the following individualized plan to manage  her diabetes and patient agrees:   -Patient will likely need intensive treatment  with basal/bolus insulin to control her diabetes to target.  However, she is not engaged enough for proper monitoring of blood glucose for safe use of insulin.    -She will be kept on premixed insulin Novolin 67/89 for simplicity reasons.   -She is approached to start strict monitoring of blood glucose 4 times a day-daily before meals and at bedtime.  -Since she has not been monitoring blood glucose, it is impossible to give her a safe adjustment in her dose of insulin.   -She is advised to increase her Novolin 70/30 to 80  units with breakfast and 80 units with supper  only when her Premeal readings are greater than 90 mg per DL. - she is warned not to take insulin without proper monitoring per orders. -New testing supplies were prescribed for her.  - she is encouraged to call clinic for blood glucose levels less than 70 or above 300 mg /dl. - she is advised to continue Metformin 1000 mg p.o. twice daily,  therapeutically suitable for patient . -She is advised to continue glipizide 5 mg XL p.o. daily at breakfast.  -She is too high risk to consider incretin therapy due to her severe dyslipidemia and heavy chronic smoking-high risk for pancreatitis.  - Specific targets for  A1c;  LDL, HDL,  and Triglycerides were discussed with the patient.  2) Blood Pressure /Hypertension:  -Her blood pressures not controlled to target.  Patient is very heavy smoker.  she is advised to be consistent with her medications including lisinopril 40 mg p.o. daily.  She also has Lasix as needed. 3) Lipids/Hyperlipidemia:   Review of her recent lipid panel showed uncontrolled  LDL at 171 .  She is advised to continue Lipitor 80 mg p.o. nightly.   Side effects and precautions discussed with her.  4)  Weight/Diet:  Body mass index is 39.57 kg/m.  -   clearly complicating her diabetes care.   she is  a candidate for weight loss. I discussed with her the fact that loss of 5 - 10% of her  current body weight will have the  most impact on her diabetes management.  Exercise, and detailed carbohydrates information provided  -  detailed on discharge instructions.  5) Chronic Care/Health Maintenance:  -she  is on ACEI/ARB and Statin medications and  is encouraged to initiate and continue to follow up with Ophthalmology, Dentist,  Podiatrist at least yearly or according to recommendations, and advised to  quit smoking. I have recommended yearly flu vaccine and pneumonia vaccine at least every 5 years; moderate intensity exercise for up to 150 minutes weekly; and  sleep for at least 7 hours a day.   The patient was counseled on the dangers of tobacco use, and was advised to quit.  Reviewed strategies to maximize success, including removing cigarettes and smoking materials from environment.    Her screening ABI today was normal, on November 16, 2020. Repeat study will be repeated in March 2027, or sooner if needed.  - she is  advised to maintain close follow up with Perlie Mayo, NP for primary care needs, as well as her other providers for optimal and coordinated care.  - Time spent on this patient care encounter:  40 min, of which > 50% was spent in  counseling and the rest reviewing her blood glucose logs , discussing her hypoglycemia and hyperglycemia episodes, reviewing her current and  previous labs / studies  ( including abstraction from other facilities) and medications  doses and developing a  long term treatment plan and documenting her care.   Please refer to Patient Instructions for Blood Glucose Monitoring and Insulin/Medications Dosing Guide"  in media tab for additional information. Please  also refer to " Patient Self Inventory" in the Media  tab for reviewed elements of pertinent patient history.  Lenore Cordia participated in the discussions, expressed understanding, and voiced agreement with the above plans.  All questions were answered to her satisfaction. she is encouraged to contact clinic should she  have any questions or concerns prior to her return visit.  Follow up plan: - Return in about 9 weeks (around 01/25/2021) for Bring Meter and Logs- A1c in Office.  Glade Lloyd, MD [facility] [address] [place], [place] [address] Phone: [phone]  Fax: [phone]    11/23/2020, 2:09 PM  This note was partially dictated with voice recognition software. Similar sounding words can be transcribed inadequately or may not  be corrected upon review.

## 2020-11-24 ENCOUNTER — Encounter: Payer: Medicaid Other | Attending: "Endocrinology | Admitting: Nutrition

## 2020-11-24 ENCOUNTER — Encounter: Payer: Self-pay | Admitting: Nutrition

## 2020-11-24 VITALS — Ht 65.0 in | Wt 235.8 lb

## 2020-11-24 DIAGNOSIS — E118 Type 2 diabetes mellitus with unspecified complications: Secondary | ICD-10-CM | POA: Insufficient documentation

## 2020-11-24 DIAGNOSIS — I10 Essential (primary) hypertension: Secondary | ICD-10-CM | POA: Diagnosis present

## 2020-11-24 DIAGNOSIS — E1165 Type 2 diabetes mellitus with hyperglycemia: Secondary | ICD-10-CM | POA: Insufficient documentation

## 2020-11-24 DIAGNOSIS — E782 Mixed hyperlipidemia: Secondary | ICD-10-CM | POA: Diagnosis present

## 2020-11-24 DIAGNOSIS — E66813 Obesity, class 3: Secondary | ICD-10-CM

## 2020-11-24 DIAGNOSIS — IMO0002 Reserved for concepts with insufficient information to code with codable children: Secondary | ICD-10-CM

## 2020-11-24 NOTE — Patient Instructions (Signed)
Goals Test blood sugars 4 times per day Eat three meals per day at times discussed. Don't skip meals. Use the wise app for meal times Record BS on sheet ans bring meter and logs to each appt. Goals am blood sugar before breakfast and supper less 150 mg/dl.

## 2020-11-24 NOTE — Progress Notes (Signed)
Lab Results  Component Value Date   HGBA1C 12.2 (H) 11/01/2020   Diabetes Self-Management Education  Visit Type: First/Initial  Appt. Start Time: 1000 Appt. End Time: 1100 11/24/2020  [name], identified by name and date of birth, is a 55 y.o. female with a diagnosis of Diabetes: Type 2.   ASSESSMENT >>> She and her husband have been kicked out of their rental house, and was living in a hotel and now living with her parents. Has been stressed a lot due to living situations. Sees a therapist.  On food stamps. Trying to get disability.  Currently taking Glipizide5 mg , Metformin 1000 BID, 70/30  80 units BID.  Has had surgery on her ear and it is how draining and hurts. Can't get back in to see ENT til the 23rd.  Height 5\' 5"  (1.651 m), weight 235 lb 12.8 oz (107 kg), last menstrual period 09/20/2015. Body mass index is 39.24 kg/m.  CMP Latest Ref Rng & Units 11/11/2020 11/01/2020 04/19/2020  Glucose 65 - 99 mg/dL - 222(H) 180(H)  BUN 6 - 24 mg/dL - 16 9  Creatinine 0.57 - 1.00 mg/dL - 1.00 0.86  Sodium 134 - 144 mmol/L - 137 139  Potassium 3.5 - 5.2 mmol/L - 4.1 3.9  Chloride 96 - 106 mmol/L - 101 106  CO2 20 - 29 mmol/L - 20 24  Calcium 8.7 - 10.2 mg/dL 9.7 11.2(H) 10.4(H)  Total Protein 6.0 - 8.5 g/dL - 7.3 8.5(H)  Total Bilirubin 0.0 - 1.2 mg/dL - 0.3 0.8  Alkaline Phos 44 - 121 IU/L - 133(H) 118  AST 0 - 40 IU/L - 8 21  ALT 0 - 32 IU/L - 12 19   Lipid Panel     Component Value Date/Time   CHOL 206 (H) 11/01/2020 0845   TRIG 155 (H) 11/01/2020 0845   HDL 42 11/01/2020 0845   CHOLHDL 5.3 04/19/2020 1159   VLDL 19 04/19/2020 1159   LDLCALC 136 (H) 11/01/2020 0845   LDLCALC 171 (H) 12/17/2019 0946   LABVLDL 28 11/01/2020 0845      Diabetes Self-Management Education - 11/24/20 1254      Visit Information   Visit Type First/Initial      Initial Visit   Diabetes Type Type 2      Health Coping   How would you rate your overall health? Fair       Psychosocial Assessment   Other persons present Patient    Special Needs None    Preferred Learning Style No preference indicated    Learning Readiness Contemplating    How often do you need to have someone help you when you read instructions, pamphlets, or other written materials from your doctor or pharmacy? 1 - Never    What is the last grade level you completed in school? 12      Pre-Education Assessment   Patient understands the diabetes disease and treatment process. Needs Instruction    Patient understands incorporating nutritional management into lifestyle. Needs Instruction    Patient undertands incorporating physical activity into lifestyle. Needs Instruction    Patient understands using medications safely. Needs Instruction    Patient understands monitoring blood glucose, interpreting and using results Needs Instruction    Patient understands prevention, detection, and treatment of acute complications. Needs Instruction    Patient understands prevention, detection, and treatment of chronic complications. Needs Instruction    Patient understands how to develop strategies to address psychosocial issues. Needs Instruction  Patient understands how to develop strategies to promote health/change behavior. Needs Instruction      Complications   Last HgB A1C per patient/outside source 12.2 %    How often do you check your blood sugar? 1-2 times/day    Fasting Blood glucose range (mg/dL) >200    Postprandial Blood glucose range (mg/dL) >200    Number of hypoglycemic episodes per month 0    Number of hyperglycemic episodes per week 14    Can you tell when your blood sugar is high? Yes    What do you do if your blood sugar is high? sleep    Have you had a dilated eye exam in the past 12 months? No    Have you had a dental exam in the past 12 months? No    Are you checking your feet? No      Dietary Intake   Breakfast skips usually but had sausage and fruit    Lunch sometimes eats     Dinner nauseaed.. went to bed    Beverage(s) water      Exercise   Exercise Type ADL's      Patient Education   Previous Diabetes Education No    Disease state  Definition of diabetes, type 1 and 2, and the diagnosis of diabetes;Factors that contribute to the development of diabetes;Explored patient's options for treatment of their diabetes    Nutrition management  Meal options for control of blood glucose level and chronic complications.;Meal timing in regards to the patients' current diabetes medication.;Reviewed blood glucose goals for pre and post meals and how to evaluate the patients' food intake on their blood glucose level.;Carbohydrate counting;Role of diet in the treatment of diabetes and the relationship between the three main macronutrients and blood glucose level    Physical activity and exercise  Role of exercise on diabetes management, blood pressure control and cardiac health.    Medications Reviewed patients medication for diabetes, action, purpose, timing of dose and side effects.    Monitoring Interpreting lab values - A1C, lipid, urine microalbumina.;Taught/discussed recording of test results and interpretation of SMBG.;Purpose and frequency of SMBG.;Identified appropriate SMBG and/or A1C goals.    Acute complications Discussed and identified patients' treatment of hyperglycemia.    Chronic complications Identified and discussed with patient  current chronic complications;Reviewed with patient heart disease, higher risk of, and prevention;Relationship between chronic complications and blood glucose control    Psychosocial adjustment Role of stress on diabetes;Identified and addressed patients feelings and concerns about diabetes    Personal strategies to promote health Lifestyle issues that need to be addressed for better diabetes care      Individualized Goals (developed by patient)   Nutrition Follow meal plan discussed;General guidelines for healthy choices and portions  discussed    Physical Activity Exercise 3-5 times per week;30 minutes per day    Medications take my medication as prescribed    Monitoring  test my blood glucose as discussed    Reducing Risk examine blood glucose patterns    Health Coping ask for help with (comment)      Post-Education Assessment   Patient understands the diabetes disease and treatment process. Needs Review    Patient understands incorporating nutritional management into lifestyle. Needs Review    Patient undertands incorporating physical activity into lifestyle. Needs Review    Patient understands using medications safely. Needs Review    Patient understands monitoring blood glucose, interpreting and using results Needs Review    Patient understands  prevention, detection, and treatment of acute complications. Needs Review    Patient understands prevention, detection, and treatment of chronic complications. Needs Review    Patient understands how to develop strategies to address psychosocial issues. Needs Review    Patient understands how to develop strategies to promote health/change behavior. Needs Review      Outcomes   Expected Outcomes Demonstrated interest in learning. Expect positive outcomes    Future DMSE 4-6 wks    Program Status Not Completed           Individualized Plan for Diabetes Self-Management Training:   Learning Objective:  Patient will have a greater understanding of diabetes self-management. Patient education plan is to attend individual and/or group sessions per assessed needs and concerns.   Plan:   Patient Instructions  Goals Test blood sugars 4 times per day Eat three meals per day at times discussed. Don't skip meals. Use the wise app for meal times Record BS on sheet ans bring meter and logs to each appt. Goals am blood sugar before breakfast and supper less 150 mg/dl.    Expected Outcomes:  Demonstrated interest in learning. Expect positive outcomes  Education material  provided: Meal plan card and My Plate  If problems or questions, patient to contact team via:  Phone and Email  Future DSME appointment: 4-6 wks

## 2020-11-26 ENCOUNTER — Encounter: Payer: Self-pay | Admitting: Internal Medicine

## 2020-11-26 ENCOUNTER — Ambulatory Visit: Payer: Medicaid Other | Admitting: Gastroenterology

## 2020-12-08 DIAGNOSIS — H61032 Chondritis of left external ear: Secondary | ICD-10-CM | POA: Diagnosis not present

## 2020-12-08 DIAGNOSIS — H6192 Disorder of left external ear, unspecified: Secondary | ICD-10-CM | POA: Diagnosis not present

## 2020-12-08 DIAGNOSIS — H6012 Cellulitis of left external ear: Secondary | ICD-10-CM | POA: Diagnosis not present

## 2020-12-23 ENCOUNTER — Ambulatory Visit: Payer: Medicaid Other | Admitting: Nutrition

## 2020-12-28 ENCOUNTER — Other Ambulatory Visit: Payer: Self-pay

## 2020-12-28 ENCOUNTER — Ambulatory Visit (INDEPENDENT_AMBULATORY_CARE_PROVIDER_SITE_OTHER): Payer: Medicaid Other | Admitting: Orthopaedic Surgery

## 2020-12-28 ENCOUNTER — Encounter: Payer: Self-pay | Admitting: Orthopaedic Surgery

## 2020-12-28 VITALS — BP 192/101 | HR 71 | Ht 65.0 in | Wt 235.0 lb

## 2020-12-28 DIAGNOSIS — G8929 Other chronic pain: Secondary | ICD-10-CM | POA: Diagnosis not present

## 2020-12-28 DIAGNOSIS — M25562 Pain in left knee: Secondary | ICD-10-CM | POA: Diagnosis not present

## 2020-12-28 DIAGNOSIS — L989 Disorder of the skin and subcutaneous tissue, unspecified: Secondary | ICD-10-CM

## 2020-12-28 DIAGNOSIS — M25561 Pain in right knee: Secondary | ICD-10-CM

## 2020-12-28 NOTE — Progress Notes (Signed)
PROCEDURE NOTE:  The patient requests injections of the left knee , verbal consent was obtained.  The left knee was prepped appropriately after time out was performed.   Sterile technique was observed and injection of 1 cc of Celestone 6 mg with several cc's of plain xylocaine. Anesthesia was provided by ethyl chloride and a 20-gauge needle was used to inject the knee area. The injection was tolerated well.  A band aid dressing was applied.  The patient was advised to apply ice later today and tomorrow to the injection sight as needed.  PROCEDURE NOTE:  The patient requests injections of the right knee , verbal consent was obtained.  The right knee was prepped appropriately after time out was performed.   Sterile technique was observed and injection of 1 cc of Celestone 6 mg with several cc's of plain xylocaine. Anesthesia was provided by ethyl chloride and a 20-gauge needle was used to inject the knee area. The injection was tolerated well.  A band aid dressing was applied.  The patient was advised to apply ice later today and tomorrow to the injection sight as needed.  Return in six weeks.  Electronically Signed Sanjuana Kava, MD 4/19/20228:10 AM

## 2020-12-28 NOTE — Patient Instructions (Signed)
Use Aspercreme, Biofreeze or Voltaren gel over the counter 2-3 times daily make sure you rub it in well each time you use it. Both legs

## 2020-12-29 ENCOUNTER — Telehealth: Payer: Self-pay | Admitting: Radiology

## 2020-12-29 NOTE — Telephone Encounter (Signed)
Thanks

## 2020-12-29 NOTE — Telephone Encounter (Signed)
[facility] is not accepting any new Medicaid patient's at this time. I called patient to let her know this, advised her to touch base with her primary care doctor about the mole, they may be able to help her.  Left message for her to advise. And asked her to call me back  To Dr. Daine Gip

## 2021-01-05 ENCOUNTER — Other Ambulatory Visit: Payer: Self-pay | Admitting: Internal Medicine

## 2021-01-05 ENCOUNTER — Other Ambulatory Visit: Payer: Self-pay | Admitting: Family Medicine

## 2021-01-13 ENCOUNTER — Telehealth: Payer: Self-pay

## 2021-01-13 ENCOUNTER — Encounter: Payer: Self-pay | Admitting: Internal Medicine

## 2021-01-13 ENCOUNTER — Other Ambulatory Visit: Payer: Self-pay

## 2021-01-13 ENCOUNTER — Ambulatory Visit (INDEPENDENT_AMBULATORY_CARE_PROVIDER_SITE_OTHER): Payer: Medicaid Other | Admitting: Internal Medicine

## 2021-01-13 DIAGNOSIS — H61032 Chondritis of left external ear: Secondary | ICD-10-CM | POA: Diagnosis not present

## 2021-01-13 DIAGNOSIS — L989 Disorder of the skin and subcutaneous tissue, unspecified: Secondary | ICD-10-CM | POA: Diagnosis not present

## 2021-01-13 MED ORDER — CEFEPIME HCL 2 G IJ SOLR: 2.0000 g | Freq: Three times a day (TID) | INTRAMUSCULAR | Status: DC

## 2021-01-13 NOTE — Progress Notes (Signed)
[facility]      Reason for Consult: chondritis    Referring Physician: Dr. Fredric Dine    Patient ID: Sonya James, female    DOB: Jan 28, 1966, 55 y.o.   MRN: [id number]  HPI:   She is here for an ongoing issue of chrondritis inflammation.   She initially went to Dr. Fredric Dine in November 2021 due to left hear inflammation in the setting of poorly controlled DM.  She has a history of piercing in the summer of 2021 and later developed an allergic reaction and noted a growth like a pimple and popped it with purulent drainage noted.  She went to ENT and started on oral Bactrim for 10 days.  Culture noted Staph and Strep and had noted some initially improvement.  In follow up in December, a cyst was noted and removed in the office and sent for pathology along with cultures.  Cultures noted and are positive for Pseudomonas, Enterococcus and Corynebacteria.  She has been on levaquin since then.  She is tearful with the interview.   Past Medical History:  Diagnosis Date  . Anemia   . Anxiety   . Arthritis   . Asthma   . Asthma 05/25/2013  . Autonomic neuropathy   . Cancer of endometrium ([place]) 10/18/2015  . Constipation   . COPD (chronic obstructive pulmonary disease) ([place])   . CTS (carpal tunnel syndrome)   . Depression   . Diabetes mellitus without complication ([place])   . Dysphagia 12/14/2016  . Encounter for screening colonoscopy   . Endometrial cancer ([place]) 2017  . Gastritis without bleeding   . GERD (gastroesophageal reflux disease)   . Gross hematuria 05/11/2019  . Headache(784.0)   . Heavy menses 10/11/2015  . HTN (hypertension)   . Hypercholesterolemia   . Hypothyroidism   . IBS (irritable bowel syndrome)   . Osteoarthritis    osteoarthritis  . Pancreatitis    per patient   . Recurrent boils    buttocks and low back.  . S/P laparoscopic hysterectomy 11/04/2015  . Shortness of breath   . Sleep apnea    CPAP machine  . Uncontrolled type 2 diabetes  mellitus with diabetic polyneuropathy, with long-term current use of insulin ([place]) 08/04/2017  . Uterine cancer ([place]) 2017  . Vitamin D deficiency     Prior to Admission medications   Medication Sig Start Date End Date Taking? Authorizing Provider  Accu-Chek Softclix Lancets lancets To test glucose 4 times a day 11/17/20  Yes Nida, Marella Chimes, MD  albuterol (PROVENTIL) (2.5 MG/3ML) 0.083% nebulizer solution Take 3 mLs (2.5 mg total) by nebulization every 6 (six) hours as needed for wheezing. 08/21/19  Yes Corum, Rex Kras, MD  albuterol (VENTOLIN HFA) 108 (90 Base) MCG/ACT inhaler Inhale 2 puffs into the lungs every 6 (six) hours as needed for wheezing or shortness of breath. 08/21/19  Yes Corum, Rex Kras, MD  apixaban (ELIQUIS) 5 MG TABS tablet Take 1 tablet (5 mg total) by mouth 2 (two) times daily. On 08/12/17 @ 9PM, start 1 tab (5 mg) two times daily. 10/28/20  Yes Perlie Mayo, NP  atorvastatin (LIPITOR) 80 MG tablet Take 1 tablet by mouth once daily 01/06/21  Yes Lindell Spar, MD  Blood Glucose Monitoring Suppl (ACCU-CHEK GUIDE ME) w/Device KIT 1 Piece by Does not apply route as directed. 11/16/20  Yes Nida, Marella Chimes, MD  ceFEPIme (MAXIPIME) 2 g injection Inject 2 g into the muscle 3 (three)  times daily. 01/13/21  Yes Javaughn Opdahl, Okey Regal, MD  esomeprazole (NEXIUM) 40 MG capsule Take 1 capsule (40 mg total) by mouth 2 (two) times daily before a meal. 05/27/20  Yes Annitta Needs, NP  furosemide (LASIX) 40 MG tablet Take 1 tablet by mouth once daily 01/05/21  Yes Lindell Spar, MD  glipiZIDE (GLUCOTROL XL) 5 MG 24 hr tablet Take 1 tablet (5 mg total) by mouth daily with breakfast. 11/16/20  Yes Nida, Marella Chimes, MD  glucose blood (ACCU-CHEK GUIDE) test strip Test glucose 4 times a day 11/17/20  Yes Nida, Marella Chimes, MD  hydrocortisone (ANUSOL-HC) 2.5 % rectal cream Place 1 application rectally 2 (two) times daily. As needed for itching 05/27/20  Yes Annitta Needs, NP  insulin NPH-regular  Human (NOVOLIN 70/30 RELION) (70-30) 100 UNIT/ML injection Inject 60 Units into the skin 2 (two) times daily with a meal. 11/16/20  Yes Nida, Marella Chimes, MD  Insulin Pen Needle (B-D ULTRAFINE III SHORT PEN) 31G X 8 MM MISC Use to inject insulin 2 times daily. 11/17/20  Yes Cassandria Anger, MD  linaclotide Rolan Lipa) 290 MCG CAPS capsule Take 1 capsule (290 mcg total) by mouth daily before breakfast. 05/27/20  Yes Annitta Needs, NP  lisinopril (ZESTRIL) 40 MG tablet Take 1 tablet by mouth once daily 01/06/21  Yes Fayrene Helper, MD  metFORMIN (GLUCOPHAGE) 1000 MG tablet Take 1 tablet (1,000 mg total) by mouth 2 (two) times daily with a meal. 11/16/20  Yes Nida, Marella Chimes, MD  methocarbamol (ROBAXIN) 500 MG tablet Take 1 tablet (500 mg total) by mouth 2 (two) times daily. 08/20/19  Yes Kendrick, Caitlyn S, PA-C  montelukast (SINGULAIR) 10 MG tablet TAKE 1 TABLET BY MOUTH AT BEDTIME 01/05/21  Yes Lindell Spar, MD  ondansetron (ZOFRAN-ODT) 4 MG disintegrating tablet Take 4 mg by mouth as needed for nausea or vomiting. 10/11/15  Yes [provider]  potassium chloride (KLOR-CON) 10 MEQ tablet TAKE 1 TABLET BY MOUTH ONCE DAILY IN THE EVENING 11/22/20  Yes Lindell Spar, MD  silver sulfADIAZINE (SILVADENE) 1 % cream Apply 1 application topically daily. 07/03/18  Yes Jonnie Kind, MD  Vitamin D, Ergocalciferol, (DRISDOL) 1.25 MG (50000 UNIT) CAPS capsule TAKE 1 CAPSULE BY MOUTH ONCE A WEEK (EVERY 7 DAYS) 11/22/20  Yes Noreene Larsson, NP  ofloxacin (FLOXIN) 0.3 % OTIC solution Place 5 drops into the left ear 2 (two) times daily. Patient not taking: Reported on 01/13/2021 06/08/20   Lindell Spar, MD    No Known Allergies  Social History   Tobacco Use  . Smoking status: Current Some Day Smoker    Packs/day: 0.50    Years: 22.00    Pack years: 11.00    Types: Cigarettes  . Smokeless tobacco: Never Used  Vaping Use  . Vaping Use: Never used  Substance Use Topics  . Alcohol  use: No  . Drug use: No    Family History  Problem Relation Age of Onset  . Diabetes Mother   . Hypertension Mother   . COPD Mother   . Asthma Mother   . Hypercholesterolemia Mother   . Hypertension Sister   . Hyperlipidemia Sister   . Diabetes Sister   . Asthma Brother   . Alcohol abuse Brother   . Asthma Son   . Cancer Maternal Grandmother        lung, throat, breast  . Alzheimer's disease Maternal Grandmother   . Hypertension Maternal  Grandmother   . Hypercholesterolemia Maternal Grandmother   . Heart disease Maternal Grandfather   . Stroke Maternal Grandfather   . Clotting disorder Maternal Grandfather        blood clots in legs  . Hypertension Sister   . Hyperlipidemia Sister   . Stroke Maternal Aunt   . Clotting disorder Maternal Aunt        blood clots in legs  . Hypertension Maternal Aunt   . Hypercholesterolemia Maternal Aunt   . Hypertension Maternal Aunt   . Asthma Maternal Aunt   . Clotting disorder Maternal Uncle        blood clot -legs traveled to heart and he died of heart attack  . Colon cancer Neg Hx     Review of Systems  Constitutional: negative for fevers and chills Gastrointestinal: negative for nausea and diarrhea Integument/breast: negative for rash All other systems reviewed and are negative    Constitutional: in no apparent distress  Vitals:   01/13/21 1012  BP: (!) 155/95  Pulse: 80  Temp: 98.2 F (36.8 C)  SpO2: 97%   EYES: anicteric ENMT: left ear with significant swelling of the entire ear, pus drainage coming out of an area near the pinna, very tender and closing around the ear canal.  No piercing in now but noted open holes with no surrounding erythema.  Cardiovascular: Cor RRR Respiratory: clear Musculoskeletal: no pedal edema noted Skin: negatives: no rash   Labs: Lab Results  Component Value Date   WBC 10.5 11/01/2020   HGB 15.6 11/01/2020   HCT 47.4 (H) 11/01/2020   MCV 92 11/01/2020   PLT 388 11/01/2020    Lab  Results  Component Value Date   CREATININE 1.00 11/01/2020   BUN 16 11/01/2020   NA 137 11/01/2020   K 4.1 11/01/2020   CL 101 11/01/2020   CO2 20 11/01/2020    Lab Results  Component Value Date   ALT 12 11/01/2020   AST 8 11/01/2020   ALKPHOS 133 (H) 11/01/2020   BILITOT 0.3 11/01/2020     Assessment: infectious chondritis failing oral antibiotic treatment.  This is clearly infected with pus and significant tenderness, swelling and pus drainage despite appropriate oral antibiotics.  At this point, a more aggressive course of IV treatment indicated as this can significantly worsen.  I will start her on cefepime IV at a high dose via home health asap.    Plan: 1) picc line 2) cefepime 2 grams IV three times a day 3) follow up in 1 week

## 2021-01-13 NOTE — Telephone Encounter (Signed)
Per MD called [facility] to schedule picc line. Patient is okay with any day/anytime. Left voicemail with Caryl Pina to schedule appt. Will need first dose at short stay. Will fax orders to [facility]. Will need to call patient with copay information. Will fax orders to short stay once [facility] pharmacy teams contact office with updates.  Leatrice Jewels, RMA

## 2021-01-13 NOTE — Telephone Encounter (Signed)
CMA was able to schedule picc appt for 5/10 at 12. Patient is okay with appointment. No questions at this time. Will fax orders to [facility] and wait on PA information and copay cost.  Sonya James, [place]

## 2021-01-13 NOTE — Telephone Encounter (Signed)
Co pay $0.43/day.   [facility] will send detailed written orders tomorrow for MD sign.  Sonya James

## 2021-01-14 NOTE — Telephone Encounter (Signed)
Faxed orders to short stay for first dose after picc appt.  Patient does not have any questions at this time.  Is okay with copay cost. Relayed appointment information before ending call. Leatrice Jewels, RMA

## 2021-01-15 LAB — WOUND CULTURE
MICRO NUMBER:: 11854413
SPECIMEN QUALITY:: ADEQUATE

## 2021-01-17 ENCOUNTER — Other Ambulatory Visit (HOSPITAL_COMMUNITY): Payer: Self-pay

## 2021-01-17 DIAGNOSIS — G43701 Chronic migraine without aura, not intractable, with status migrainosus: Secondary | ICD-10-CM | POA: Diagnosis not present

## 2021-01-17 DIAGNOSIS — M542 Cervicalgia: Secondary | ICD-10-CM | POA: Diagnosis not present

## 2021-01-17 DIAGNOSIS — E1140 Type 2 diabetes mellitus with diabetic neuropathy, unspecified: Secondary | ICD-10-CM | POA: Diagnosis not present

## 2021-01-17 DIAGNOSIS — M25569 Pain in unspecified knee: Secondary | ICD-10-CM | POA: Diagnosis not present

## 2021-01-17 DIAGNOSIS — M545 Low back pain, unspecified: Secondary | ICD-10-CM | POA: Diagnosis not present

## 2021-01-17 DIAGNOSIS — Z79891 Long term (current) use of opiate analgesic: Secondary | ICD-10-CM | POA: Diagnosis not present

## 2021-01-17 DIAGNOSIS — M79606 Pain in leg, unspecified: Secondary | ICD-10-CM | POA: Diagnosis not present

## 2021-01-18 ENCOUNTER — Other Ambulatory Visit: Payer: Self-pay | Admitting: Internal Medicine

## 2021-01-18 ENCOUNTER — Other Ambulatory Visit: Payer: Self-pay

## 2021-01-18 ENCOUNTER — Ambulatory Visit (HOSPITAL_COMMUNITY)
Admission: RE | Admit: 2021-01-18 | Discharge: 2021-01-18 | Disposition: A | Discharge status: Home / Self Care | Payer: Medicaid Other | Source: Ambulatory Visit | Attending: Internal Medicine | Admitting: Internal Medicine

## 2021-01-18 ENCOUNTER — Encounter (HOSPITAL_COMMUNITY)
Admission: RE | Admit: 2021-01-18 | Discharge: 2021-01-18 | Disposition: A | Discharge status: Home / Self Care | Payer: Medicaid Other | Source: Ambulatory Visit | Attending: Internal Medicine | Admitting: Internal Medicine

## 2021-01-18 DIAGNOSIS — H61032 Chondritis of left external ear: Secondary | ICD-10-CM | POA: Insufficient documentation

## 2021-01-18 DIAGNOSIS — Z452 Encounter for adjustment and management of vascular access device: Secondary | ICD-10-CM | POA: Diagnosis not present

## 2021-01-18 HISTORY — PX: IR US GUIDE VASC ACCESS LEFT: IMG2389

## 2021-01-18 MED ORDER — HEPARIN SOD (PORK) LOCK FLUSH 100 UNIT/ML IV SOLN
INTRAVENOUS | Status: AC
  Administered 2021-01-18: 250 [IU]
  Filled 2021-01-18: qty 5

## 2021-01-18 MED ORDER — LIDOCAINE HCL 1 % IJ SOLN
INTRAMUSCULAR | Status: DC | PRN
  Administered 2021-01-18: 5 mL

## 2021-01-18 MED ORDER — SODIUM CHLORIDE 0.9 % IV SOLN
2.0000 g | Freq: Once | INTRAVENOUS | Status: AC
  Administered 2021-01-18: 2 g via INTRAVENOUS
  Filled 2021-01-18: qty 2

## 2021-01-18 MED ORDER — LIDOCAINE HCL 1 % IJ SOLN
INTRAMUSCULAR | Status: AC
  Filled 2021-01-18: qty 20

## 2021-01-18 MED ORDER — HEPARIN SOD (PORK) LOCK FLUSH 100 UNIT/ML IV SOLN: 250.0000 [IU] | Freq: Once | INTRAVENOUS | Status: DC

## 2021-01-18 NOTE — Procedures (Signed)
Pre procedural Diagnosis: Poor venous access Post Procedural Diagnosis: Same  Successful placement of left basilic vein approach 49 cm single lumen PICC line with tip at the superior caval-atrial junction.    EBL: Trace  No immediate post procedural complication.  The PICC line is ready for immediate use.  Ronny Bacon, MD Pager #: [phone]

## 2021-01-21 ENCOUNTER — Ambulatory Visit: Payer: Medicaid Other | Admitting: Internal Medicine

## 2021-01-25 ENCOUNTER — Encounter: Payer: Self-pay | Admitting: Internal Medicine

## 2021-01-25 ENCOUNTER — Ambulatory Visit (INDEPENDENT_AMBULATORY_CARE_PROVIDER_SITE_OTHER): Payer: Medicaid Other | Admitting: Internal Medicine

## 2021-01-25 ENCOUNTER — Other Ambulatory Visit: Payer: Self-pay

## 2021-01-25 VITALS — BP 154/87 | HR 64 | Wt 241.0 lb

## 2021-01-25 DIAGNOSIS — Z5181 Encounter for therapeutic drug level monitoring: Secondary | ICD-10-CM

## 2021-01-25 DIAGNOSIS — H61032 Chondritis of left external ear: Secondary | ICD-10-CM | POA: Diagnosis not present

## 2021-01-25 DIAGNOSIS — Z452 Encounter for adjustment and management of vascular access device: Secondary | ICD-10-CM

## 2021-01-25 NOTE — Progress Notes (Signed)
   Subjective:    Patient ID: Sonya James, female    DOB: 1966/02/05, 55 y.o.   MRN: [id number]  HPI Here for follow up of chondritis of her left ear She started on IV cefepime with signficant left ear swelling, erythema and pus drainage and culture with Pseudomonas from previous swabs.  Culture I sent grew out MSSA.  She has been on treatment about 7 days and no issues with rash or diarrhea.  She feels cautiously improved.  No significant concerns at this time.     Review of Systems  Constitutional: Negative for fever.  Gastrointestinal: Negative for diarrhea and nausea.  Skin: Negative for rash.       Objective:   Physical Exam HENT:     Ears:     Comments: Left ear with some decrease in swelling, more notable improvement in tenderness and no warmth now Eyes:     General: No scleral icterus. Neurological:     Mental Status: She is alert.  Psychiatric:        Mood and Affect: Mood normal.   SH: + tobacco        Assessment & Plan:

## 2021-01-25 NOTE — Assessment & Plan Note (Signed)
No issues with the picc line at this time.  No erythema and working well.

## 2021-01-25 NOTE — Assessment & Plan Note (Signed)
She is getting weekly labs and will continue to monitor.  No labs available to me yet.

## 2021-01-25 NOTE — Assessment & Plan Note (Signed)
Definitely some improvement and I am hopefull by another week or two it will be significantly improved.  Plan for 3 weeks of antibiotics of which she has completed 1 week so far.   She will follow up with me for a recheck next Friday

## 2021-01-27 ENCOUNTER — Ambulatory Visit: Payer: Medicaid Other | Admitting: "Endocrinology

## 2021-01-31 DIAGNOSIS — H61002 Unspecified perichondritis of left external ear: Secondary | ICD-10-CM | POA: Diagnosis not present

## 2021-02-04 ENCOUNTER — Ambulatory Visit: Payer: Medicaid Other | Admitting: Internal Medicine

## 2021-02-08 ENCOUNTER — Ambulatory Visit: Payer: Medicaid Other | Admitting: Orthopaedic Surgery

## 2021-02-08 DIAGNOSIS — H61002 Unspecified perichondritis of left external ear: Secondary | ICD-10-CM | POA: Diagnosis not present

## 2021-02-11 ENCOUNTER — Telehealth: Payer: Self-pay

## 2021-02-11 NOTE — Telephone Encounter (Signed)
Patient was scheduled to see Dr. Linus Salmons on 5/27 for follow up. Patient cancelled and rescheduled for 6/29. Patient received 3 weeks of IV abx that were completed on Tuesday. Patient doesn't report any further swelling, drainage, pain or discomfort in her ear. Per Dr. Linus Salmons, ok to pull PICC.   Contacted [facility] ([facility]) and gave verbal orders to pull PICC. Patient instructed to follow up sooner if needed.   Nina Mondor Lorita Officer, RN

## 2021-02-11 NOTE — Telephone Encounter (Signed)
-----   Message from Thayer Headings, MD sent at 02/11/2021  9:30 AM EDT ----- She missed her follow up and has now completed her 3 weeks of IV cefepime. Can you check if her ear is better and if her picc line is still in?  She will need it removed if still in or come in for reevaluation. thanks

## 2021-02-15 ENCOUNTER — Other Ambulatory Visit: Payer: Self-pay

## 2021-02-15 ENCOUNTER — Inpatient Hospital Stay (HOSPITAL_COMMUNITY): Payer: Medicaid Other | Attending: Hematology

## 2021-02-15 DIAGNOSIS — J449 Chronic obstructive pulmonary disease, unspecified: Secondary | ICD-10-CM | POA: Diagnosis not present

## 2021-02-15 DIAGNOSIS — Z794 Long term (current) use of insulin: Secondary | ICD-10-CM | POA: Insufficient documentation

## 2021-02-15 DIAGNOSIS — E039 Hypothyroidism, unspecified: Secondary | ICD-10-CM | POA: Insufficient documentation

## 2021-02-15 DIAGNOSIS — I10 Essential (primary) hypertension: Secondary | ICD-10-CM | POA: Diagnosis not present

## 2021-02-15 DIAGNOSIS — Z7901 Long term (current) use of anticoagulants: Secondary | ICD-10-CM | POA: Diagnosis not present

## 2021-02-15 DIAGNOSIS — Z90722 Acquired absence of ovaries, bilateral: Secondary | ICD-10-CM | POA: Insufficient documentation

## 2021-02-15 DIAGNOSIS — Z90710 Acquired absence of both cervix and uterus: Secondary | ICD-10-CM | POA: Insufficient documentation

## 2021-02-15 DIAGNOSIS — Z86718 Personal history of other venous thrombosis and embolism: Secondary | ICD-10-CM | POA: Diagnosis present

## 2021-02-15 DIAGNOSIS — Z86711 Personal history of pulmonary embolism: Secondary | ICD-10-CM | POA: Diagnosis not present

## 2021-02-15 DIAGNOSIS — Z79899 Other long term (current) drug therapy: Secondary | ICD-10-CM | POA: Insufficient documentation

## 2021-02-15 DIAGNOSIS — I2782 Chronic pulmonary embolism: Secondary | ICD-10-CM

## 2021-02-15 DIAGNOSIS — Z9079 Acquired absence of other genital organ(s): Secondary | ICD-10-CM | POA: Diagnosis not present

## 2021-02-15 DIAGNOSIS — Z8542 Personal history of malignant neoplasm of other parts of uterus: Secondary | ICD-10-CM | POA: Insufficient documentation

## 2021-02-15 DIAGNOSIS — E7800 Pure hypercholesterolemia, unspecified: Secondary | ICD-10-CM | POA: Insufficient documentation

## 2021-02-15 LAB — CBC WITH DIFFERENTIAL/PLATELET
Abs Immature Granulocytes: 0.02 10*3/uL (ref 0.00–0.07)
Basophils Absolute: 0 10*3/uL (ref 0.0–0.1)
Basophils Relative: 1 %
Eosinophils Absolute: 0.5 10*3/uL (ref 0.0–0.5)
Eosinophils Relative: 7 %
HCT: 40.9 % (ref 36.0–46.0)
Hemoglobin: 13.3 g/dL (ref 12.0–15.0)
Immature Granulocytes: 0 %
Lymphocytes Relative: 32 %
Lymphs Abs: 2.2 10*3/uL (ref 0.7–4.0)
MCH: 30.8 pg (ref 26.0–34.0)
MCHC: 32.5 g/dL (ref 30.0–36.0)
MCV: 94.7 fL (ref 80.0–100.0)
Monocytes Absolute: 0.4 10*3/uL (ref 0.1–1.0)
Monocytes Relative: 5 %
Neutro Abs: 3.7 10*3/uL (ref 1.7–7.7)
Neutrophils Relative %: 55 %
Platelets: 252 10*3/uL (ref 150–400)
RBC: 4.32 MIL/uL (ref 3.87–5.11)
RDW: 11.8 % (ref 11.5–15.5)
WBC: 6.9 10*3/uL (ref 4.0–10.5)
nRBC: 0 % (ref 0.0–0.2)

## 2021-02-15 LAB — COMPREHENSIVE METABOLIC PANEL
ALT: 10 U/L (ref 0–44)
AST: 11 U/L — ABNORMAL LOW (ref 15–41)
Albumin: 3.4 g/dL — ABNORMAL LOW (ref 3.5–5.0)
Alkaline Phosphatase: 82 U/L (ref 38–126)
Anion gap: 6 (ref 5–15)
BUN: 7 mg/dL (ref 6–20)
CO2: 26 mmol/L (ref 22–32)
Calcium: 9.6 mg/dL (ref 8.9–10.3)
Chloride: 107 mmol/L (ref 98–111)
Creatinine, Ser: 0.71 mg/dL (ref 0.44–1.00)
GFR, Estimated: >60 mL/min (ref >60)
Glucose, Bld: 215 mg/dL — ABNORMAL HIGH (ref 70–99)
Potassium: 3.7 mmol/L (ref 3.5–5.1)
Sodium: 139 mmol/L (ref 135–145)
Total Bilirubin: 0.7 mg/dL (ref 0.3–1.2)
Total Protein: 7 g/dL (ref 6.5–8.1)

## 2021-02-15 LAB — LACTATE DEHYDROGENASE: LDH: 118 U/L (ref 98–192)

## 2021-02-15 LAB — D-DIMER, QUANTITATIVE: D-Dimer, Quant: 0.27 ug/mL-FEU (ref 0.00–0.50)

## 2021-02-16 ENCOUNTER — Other Ambulatory Visit: Payer: Self-pay | Admitting: Obstetrics and Gynecology

## 2021-02-16 NOTE — Patient Instructions (Signed)
Visit Information  [name]  - as a part of your Medicaid benefit, you are eligible for care management and care coordination services at no cost or copay. I was unable to reach you by phone today but would be happy to help you with your health related needs. Please feel free to call me at [phone].  A member of the Managed Medicaid care management team will reach out to you again over the next 7-14  days.   Aida Raider RN, BSN [place]  [facility] - Managed Medicaid High Risk [phone].

## 2021-02-16 NOTE — Patient Outreach (Signed)
Care Coordination  02/16/2021  DAMILOLA FLAMM [birth year] [id number]    Medicaid Managed Care   Unsuccessful Outreach Note  02/16/2021 Name: KEIONNA KINNAIRD MRN: [id number] DOB: 1966/03/21  Referred by: Perlie Mayo, NP Reason for referral : High Risk Managed Medicaid (Unsuccessful telephone outreach)   An unsuccessful telephone outreach was attempted today. The patient was referred to the case management team for assistance with care management and care coordination.   Follow Up Plan: A member of the Managed Medicaid care management team will reach out to the patient again over the next 7-14 days.   Aida Raider RN, BSN [place]  [facility] - Managed Medicaid High Risk [phone].

## 2021-02-17 ENCOUNTER — Other Ambulatory Visit: Payer: Self-pay

## 2021-02-17 ENCOUNTER — Encounter: Payer: Self-pay | Admitting: Orthopaedic Surgery

## 2021-02-17 ENCOUNTER — Ambulatory Visit (INDEPENDENT_AMBULATORY_CARE_PROVIDER_SITE_OTHER): Payer: Medicaid Other | Admitting: Orthopaedic Surgery

## 2021-02-17 DIAGNOSIS — G8929 Other chronic pain: Secondary | ICD-10-CM | POA: Diagnosis not present

## 2021-02-17 DIAGNOSIS — M25562 Pain in left knee: Secondary | ICD-10-CM

## 2021-02-17 DIAGNOSIS — M25561 Pain in right knee: Secondary | ICD-10-CM | POA: Diagnosis not present

## 2021-02-17 NOTE — Progress Notes (Signed)
PROCEDURE NOTE:  The patient requests injections of the right knee , verbal consent was obtained.  The right knee was prepped appropriately after time out was performed.   Sterile technique was observed and injection of 1 cc of DepoMedrol 40mg  with several cc's of plain xylocaine. Anesthesia was provided by ethyl chloride and a 20-gauge needle was used to inject the knee area. The injection was tolerated well.  A band aid dressing was applied.  The patient was advised to apply ice later today and tomorrow to the injection sight as needed.  PROCEDURE NOTE:  The patient requests injections of the left knee , verbal consent was obtained.  The left knee was prepped appropriately after time out was performed.   Sterile technique was observed and injection of 1 cc of DepoMedrol 40 mg with several cc's of plain xylocaine. Anesthesia was provided by ethyl chloride and a 20-gauge needle was used to inject the knee area. The injection was tolerated well.  A band aid dressing was applied.  The patient was advised to apply ice later today and tomorrow to the injection sight as needed.   Return in six weeks.  Call if any problem.  Precautions discussed.  Electronically Signed Sanjuana Kava, MD 6/9/20228:15 AM

## 2021-02-21 NOTE — Progress Notes (Signed)
[name], [place] [address]   CLINIC:  Medical Oncology/Hematology  PCP:  Perlie Mayo, NP [address] / [place] [address]  [phone]  REASON FOR VISIT:  Follow-up for unprovoked pulmonary embolism  PRIOR THERAPY: none  CURRENT THERAPY: surveillance  INTERVAL HISTORY:  [name], a 55 y.o. female, returns for routine follow-up for her  unprovoked pulmonary embolism. Britanni was last seen on 02/11/2020.  Today she reports feeling well. She denies any bleeding issues or blood in the urine within the last year. She has a history of DVT, but she has had only one episode of PE. She had a PICC line removed 02/16/2021; it was placed to give antibiotics for 3 weeks.  REVIEW OF SYSTEMS:  Review of Systems  Constitutional:  Positive for fatigue (50%). Negative for appetite change.  Gastrointestinal:  Positive for constipation and nausea.  Genitourinary:  Negative for hematuria.   Hematological:  Does not bruise/bleed easily.  All other systems reviewed and are negative.  PAST MEDICAL/SURGICAL HISTORY:  Past Medical History:  Diagnosis Date   Anemia    Anxiety    Arthritis    Asthma    Asthma 05/25/2013   Autonomic neuropathy    Cancer of endometrium ([facility]) 10/18/2015   Constipation    COPD (chronic obstructive pulmonary disease) (HCC)    CTS (carpal tunnel syndrome)    Depression    Diabetes mellitus without complication ([facility])    Dysphagia 12/14/2016   Encounter for screening colonoscopy    Endometrial cancer ([facility]) 2017   Gastritis without bleeding    GERD (gastroesophageal reflux disease)    Gross hematuria 05/11/2019   Headache(784.0)    Heavy menses 10/11/2015   HTN (hypertension)    Hypercholesterolemia    Hypothyroidism    IBS (irritable bowel syndrome)    Osteoarthritis    osteoarthritis   Pancreatitis    per patient    Recurrent boils    buttocks and low back.   S/P laparoscopic hysterectomy 11/04/2015    Shortness of breath    Sleep apnea    CPAP machine   Uncontrolled type 2 diabetes mellitus with diabetic polyneuropathy, with long-term current use of insulin ([facility]) 08/04/2017   Uterine cancer ([facility]) 2017   Vitamin D deficiency    Past Surgical History:  Procedure Laterality Date   ABDOMINAL HYSTERECTOMY  2018   endometrial cancer, surgery done at [facility]  01/02/2017   Procedure: BIOPSY;  Surgeon: Danie Binder, MD;  [facility];  Service: Endoscopy;;  gastric biopsy   CESAREAN SECTION  1989   COLONOSCOPY WITH PROPOFOL N/A 01/02/2017   Dr. Oneida Alar: redundant colon, two 2-3 mm polyps in sigmoid colon (hyperplastic)   ESOPHAGOGASTRODUODENOSCOPY  05/22/2011   Dr. Oneida Alar: H.pylori gastritis    ESOPHAGOGASTRODUODENOSCOPY (EGD) WITH PROPOFOL N/A 01/02/2017   Dr. 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 s/p dilation, moderate gastritis (negative H.pylori)   FOOT SURGERY Left    tendon repair   IR US GUIDE VASC ACCESS LEFT  01/18/2021   OOPHORECTOMY     POLYPECTOMY  01/02/2017   Procedure: POLYPECTOMY;  Surgeon: Danie Binder, MD;  [facility]: [facility];  Service: Endoscopy;;  sigmoid colon polyps times 2   REDUCTION MAMMAPLASTY  1996   SAVORY DILATION N/A 01/02/2017   Procedure: SAVORY DILATION;  Surgeon: Danie Binder, MD;  [facility];  Service: Endoscopy;  Laterality: N/A;  TUBAL LIGATION      SOCIAL HISTORY:  Social History   Socioeconomic History   Marital status: Married    Spouse name: Not on file   Number of children: 1   Years of education: Not on file   Highest education level: Not on file  Occupational History   Not on file  Tobacco Use   Smoking status: Some Days    Packs/day: 0.50    Years: 22.00    Pack years: 11.00    Types: Cigarettes   Smokeless tobacco: Never  Vaping Use   Vaping Use: Never used  Substance and Sexual Activity   Alcohol use: No   Drug use: No   Sexual activity: Yes    Birth control/protection:  Surgical    Comment: hyst  Other Topics Concern   Not on file  Social History Narrative   Lives with husband and she has been married to for 9 years.  Recently got evicted and has been living in a motel since last July.   She has 1 son that lives in [place].  Reports one grandbaby      Enjoys watching TV mostly animal [place].      Diet: Eats all food groups   Caffeine: Soda not diet, green tea, coffee about 3 cups of caffeine per day.   Water: Drinks 5-6 8 ounce bottles throughout the day.      Wears a seatbelt.  Currently does not have a vehicle.  Smoke detectors in the facility she is living in.  Does not have any weapons.   Social Determinants of Health   Financial Resource Strain: Low Risk    Difficulty of Paying Living Expenses: Not hard at all  Food Insecurity: No Food Insecurity   Worried About Charity fundraiser in the Last Year: Never true   Arboriculturist in the Last Year: Never true  Transportation Needs: Public librarian (Medical): Yes   Lack of Transportation (Non-Medical): Yes  Physical Activity: Insufficiently Active   Days of Exercise per Week: 3 days   Minutes of Exercise per Session: 20 min  Stress: No Stress Concern Present   Feeling of Stress : Not at all  Social Connections: Moderately Isolated   Frequency of Communication with Friends and Family: More than three times a week   Frequency of Social Gatherings with Friends and Family: More than three times a week   Attends Religious Services: 1 to 4 times per year   Active Member of [employer] or Organizations: No   Attends Music therapist: Never   Marital Status: Separated  Human resources officer Violence: Not At Risk   Fear of Current or Ex-Partner: No   Emotionally Abused: No   Physically Abused: No   Sexually Abused: No    FAMILY HISTORY:  Family History  Problem Relation Age of Onset   Diabetes Mother    Hypertension Mother    COPD Mother    Asthma  Mother    Hypercholesterolemia Mother    Hypertension Sister    Hyperlipidemia Sister    Diabetes Sister    Asthma Brother    Alcohol abuse Brother    Asthma Son    Cancer Maternal Grandmother        lung, throat, breast   Alzheimer's disease Maternal Grandmother    Hypertension Maternal Grandmother    Hypercholesterolemia Maternal Grandmother    Heart disease Maternal Grandfather    Stroke Maternal Grandfather  Clotting disorder Maternal Grandfather        blood clots in legs   Hypertension Sister    Hyperlipidemia Sister    Stroke Maternal Aunt    Clotting disorder Maternal Aunt        blood clots in legs   Hypertension Maternal Aunt    Hypercholesterolemia Maternal Aunt    Hypertension Maternal Aunt    Asthma Maternal Aunt    Clotting disorder Maternal Uncle        blood clot -legs traveled to heart and he died of heart attack   Colon cancer Neg Hx     CURRENT MEDICATIONS:  Current Outpatient Medications  Medication Sig Dispense Refill   Accu-Chek Softclix Lancets lancets To test glucose 4 times a day 150 each 2   albuterol (PROVENTIL) (2.5 MG/3ML) 0.083% nebulizer solution Take 3 mLs (2.5 mg total) by nebulization every 6 (six) hours as needed for wheezing. 75 mL 12   albuterol (VENTOLIN HFA) 108 (90 Base) MCG/ACT inhaler Inhale 2 puffs into the lungs every 6 (six) hours as needed for wheezing or shortness of breath. 18 g 5   apixaban (ELIQUIS) 5 MG TABS tablet Take 1 tablet (5 mg total) by mouth 2 (two) times daily. On 08/12/17 @ 9PM, start 1 tab (5 mg) two times daily. 60 tablet 5   atorvastatin (LIPITOR) 80 MG tablet Take 1 tablet by mouth once daily 90 tablet 0   Blood Glucose Monitoring Suppl (ACCU-CHEK GUIDE ME) w/Device KIT 1 Piece by Does not apply route as directed. 1 kit 0   ceFEPIme (MAXIPIME) 2 g injection Inject 2 g into the muscle 3 (three) times daily. 1 each    esomeprazole (NEXIUM) 40 MG capsule Take 1 capsule (40 mg total) by mouth 2 (two) times daily  before a meal. 180 capsule 3   furosemide (LASIX) 40 MG tablet Take 1 tablet by mouth once daily 90 tablet 0   glipiZIDE (GLUCOTROL XL) 5 MG 24 hr tablet Take 1 tablet (5 mg total) by mouth daily with breakfast. 90 tablet 1   glucose blood (ACCU-CHEK GUIDE) test strip Test glucose 4 times a day 150 each 2   hydrocortisone (ANUSOL-HC) 2.5 % rectal cream Place 1 application rectally 2 (two) times daily. As needed for itching 30 g 1   insulin NPH-regular Human (NOVOLIN 70/30 RELION) (70-30) 100 UNIT/ML injection Inject 60 Units into the skin 2 (two) times daily with a meal. 30 mL 2   Insulin Pen Needle (B-D ULTRAFINE III SHORT PEN) 31G X 8 MM MISC Use to inject insulin 2 times daily. 100 each 2   linaclotide (LINZESS) 290 MCG CAPS capsule Take 1 capsule (290 mcg total) by mouth daily before breakfast. 90 capsule 3   lisinopril (ZESTRIL) 40 MG tablet Take 1 tablet by mouth once daily 30 tablet 0   metFORMIN (GLUCOPHAGE) 1000 MG tablet Take 1 tablet (1,000 mg total) by mouth 2 (two) times daily with a meal. 60 tablet 2   methocarbamol (ROBAXIN) 500 MG tablet Take 1 tablet (500 mg total) by mouth 2 (two) times daily. 20 tablet 0   montelukast (SINGULAIR) 10 MG tablet TAKE 1 TABLET BY MOUTH AT BEDTIME 30 tablet 0   ofloxacin (FLOXIN) 0.3 % OTIC solution Place 5 drops into the left ear 2 (two) times daily. (Patient not taking: Reported on 01/13/2021) 5 mL 0   ondansetron (ZOFRAN-ODT) 4 MG disintegrating tablet Take 4 mg by mouth as needed for nausea or vomiting.  potassium chloride (KLOR-CON) 10 MEQ tablet TAKE 1 TABLET BY MOUTH ONCE DAILY IN THE EVENING 90 tablet 0   silver sulfADIAZINE (SILVADENE) 1 % cream Apply 1 application topically daily. 50 g 0   Vitamin D, Ergocalciferol, (DRISDOL) 1.25 MG (50000 UNIT) CAPS capsule TAKE 1 CAPSULE BY MOUTH ONCE A WEEK (EVERY 7 DAYS) 8 capsule 0   No current facility-administered medications for this visit.    ALLERGIES:  No Known Allergies  PHYSICAL EXAM:   Performance status (ECOG): 1 - Symptomatic but completely ambulatory  There were no vitals filed for this visit. Wt Readings from Last 3 Encounters:  01/25/21 241 lb (109.3 kg)  01/13/21 245 lb (111.1 kg)  12/28/20 235 lb (106.6 kg)   Physical Exam Vitals reviewed.  Constitutional:      Appearance: Normal appearance.  Cardiovascular:     Rate and Rhythm: Normal rate and regular rhythm.     Pulses: Normal pulses.     Heart sounds: Normal heart sounds.  Pulmonary:     Effort: Pulmonary effort is normal.     Breath sounds: Normal breath sounds.  Abdominal:     Palpations: Abdomen is soft. There is no hepatomegaly, splenomegaly or mass.     Tenderness: There is no abdominal tenderness.     Hernia: A hernia is present.  Musculoskeletal:     Right lower leg: No edema.     Left lower leg: No edema.  Neurological:     General: No focal deficit present.     Mental Status: She is alert and oriented to person, place, and time.  Psychiatric:        Mood and Affect: Mood normal.        Behavior: Behavior normal.    LABORATORY DATA:  I have reviewed the labs as listed.  CBC Latest Ref Rng & Units 02/15/2021 11/01/2020 02/11/2020  WBC 4.0 - 10.5 K/uL 6.9 10.5 7.8  Hemoglobin 12.0 - 15.0 g/dL 13.3 15.6 13.8  Hematocrit 36.0 - 46.0 % 40.9 47.4(H) 42.4  Platelets 150 - 400 K/uL 252 388 308   CMP Latest Ref Rng & Units 02/15/2021 11/11/2020 11/01/2020  Glucose 70 - 99 mg/dL 215(H) - 222(H)  BUN 6 - 20 mg/dL 7 - 16  Creatinine 0.44 - 1.00 mg/dL 0.71 - 1.00  Sodium 135 - 145 mmol/L 139 - 137  Potassium 3.5 - 5.1 mmol/L 3.7 - 4.1  Chloride 98 - 111 mmol/L 107 - 101  CO2 22 - 32 mmol/L 26 - 20  Calcium 8.9 - 10.3 mg/dL 9.6 9.7 11.2(H)  Total Protein 6.5 - 8.1 g/dL 7.0 - 7.3  Total Bilirubin 0.3 - 1.2 mg/dL 0.7 - 0.3  Alkaline Phos 38 - 126 U/L 82 - 133(H)  AST 15 - 41 U/L 11(L) - 8  ALT 0 - 44 U/L 10 - 12      Component Value Date/Time   RBC 4.32 02/15/2021 1043   MCV 94.7 02/15/2021  1043   MCV 92 11/01/2020 0845   MCH 30.8 02/15/2021 1043   MCHC 32.5 02/15/2021 1043   RDW 11.8 02/15/2021 1043   RDW 11.4 (L) 11/01/2020 0845   LYMPHSABS 2.2 02/15/2021 1043   LYMPHSABS 4.3 (H) 11/01/2020 0845   MONOABS 0.4 02/15/2021 1043   EOSABS 0.5 02/15/2021 1043   EOSABS 0.2 11/01/2020 0845   BASOSABS 0.0 02/15/2021 1043   BASOSABS 0.0 11/01/2020 0845    DIAGNOSTIC IMAGING:  I have independently reviewed the scans and discussed with the patient.  No results found.   ASSESSMENT:  1.  Unprovoked pulmonary embolism: -Diagnosed in November 2018, on Eliquis 5 mg twice daily, without any bleeding complications. -She did not have any risk factors for pulmonary embolism. -Her blood work was negative for lupus anticoagulant, anticardiolipin antibodies, antibeta-2 glycoprotein 1 antibodies.  It was also negative for other tests like antithrombin III, [place] [address] a mutation, protein C and protein S deficiencies. -CT CAP in May 2019 was negative for any metastatic disease.  This was done because of her history of endometrial cancer.  As she had unprovoked pulmonary embolism, her chance of recurrent DVT is about 8% at year 1 and about 40% at year 5.  At this time the benefits of continued anticoagulation outweigh the risk.  Hence I have recommended indefinite anticoagulation.  Patient is agreeable to this option. -D-dimer from 01/31/2019 was negative.  2.  Endometrial cancer: - She underwent TLH, BSO, bilateral sentinel lymph node dissection on 11/04/2015 at [facility]. - It was a stage Ia, grade 1-low risk.   PLAN:  1.  Unprovoked pulmonary embolism: - She is continuing to tolerate Eliquis without any bleeding issues. - She reportedly had left external ear infection for which she required a PICC line on the left side for 3 weeks for antibiotics. - At this time the benefits of continuing anticoagulation outweigh risks. - Reviewed her labs from 02/15/2021 which showed  normal CBC and renal function. - Will reevaluate risk-benefit ratio in 1 year.  Orders placed this encounter:  No orders of the defined types were placed in this encounter.    Derek Jack, MD [place] [phone]   I, Thana Ates, am acting as a scribe for Dr. Derek Jack.  I, Derek Jack MD, have reviewed the above documentation for accuracy and completeness, and I agree with the above.

## 2021-02-22 ENCOUNTER — Other Ambulatory Visit: Payer: Self-pay

## 2021-02-22 ENCOUNTER — Inpatient Hospital Stay (HOSPITAL_BASED_OUTPATIENT_CLINIC_OR_DEPARTMENT_OTHER): Payer: Medicaid Other | Admitting: Hematology

## 2021-02-22 VITALS — BP 162/82 | HR 58 | Temp 96.9°F | Wt 243.2 lb

## 2021-02-22 DIAGNOSIS — Z86711 Personal history of pulmonary embolism: Secondary | ICD-10-CM | POA: Diagnosis not present

## 2021-02-22 DIAGNOSIS — I2782 Chronic pulmonary embolism: Secondary | ICD-10-CM | POA: Diagnosis not present

## 2021-02-22 NOTE — Patient Instructions (Signed)
[facility] at [facility] Discharge Instructions  You were seen today by Dr. Katragadda. He went over your recent results. Dr. Katragadda will see you back in 1 year for labs and follow up.   Thank you for choosing  Cancer Center at [facility] to provide your oncology and hematology care.  To afford each patient quality time with our provider, please arrive at least 15 minutes before your scheduled appointment time.   If you have a lab appointment with the Cancer Center please come in thru the Main Entrance and check in at the main information desk  You need to re-schedule your appointment should you arrive 10 or more minutes late.  We strive to give you quality time with our providers, and arriving late affects you and other patients whose appointments are after yours.  Also, if you no show three or more times for appointments you may be dismissed from the clinic at the providers discretion.     Again, thank you for choosing [facility].  Our hope is that these requests will decrease the amount of time that you wait before being seen by our physicians.       _____________________________________________________________  Should you have questions after your visit to [facility], please contact our office at [phone] between the hours of 8:00 a.m. and 4:30 p.m.  Voicemails left after 4:00 p.m. will not be returned until the following business day.  For prescription refill requests, have your pharmacy contact our office and allow 72 hours.    Cancer Center Support Programs:   > Cancer Support Group  2nd Tuesday of the month 1pm-2pm, Journey Room    

## 2021-03-03 ENCOUNTER — Other Ambulatory Visit: Payer: Self-pay | Admitting: Family Medicine

## 2021-03-03 ENCOUNTER — Other Ambulatory Visit: Payer: Self-pay | Admitting: Internal Medicine

## 2021-03-08 ENCOUNTER — Ambulatory Visit: Payer: Medicaid Other | Admitting: "Endocrinology

## 2021-03-09 ENCOUNTER — Ambulatory Visit (INDEPENDENT_AMBULATORY_CARE_PROVIDER_SITE_OTHER): Payer: Medicaid Other | Admitting: Internal Medicine

## 2021-03-09 ENCOUNTER — Encounter: Payer: Self-pay | Admitting: Internal Medicine

## 2021-03-09 ENCOUNTER — Other Ambulatory Visit: Payer: Self-pay

## 2021-03-09 DIAGNOSIS — H61032 Chondritis of left external ear: Secondary | ICD-10-CM

## 2021-03-09 DIAGNOSIS — Z452 Encounter for adjustment and management of vascular access device: Secondary | ICD-10-CM | POA: Diagnosis not present

## 2021-03-09 DIAGNOSIS — H9202 Otalgia, left ear: Secondary | ICD-10-CM

## 2021-03-09 NOTE — Assessment & Plan Note (Signed)
Her external ear infection has resolved.  No further drainage, no erythema or issues.   No further antibiotics indicated.

## 2021-03-09 NOTE — Assessment & Plan Note (Signed)
Now removed and no concerns on exam with her arm.

## 2021-03-09 NOTE — Assessment & Plan Note (Signed)
Continued pain in left ear but as above, no signs now of active infection.  May be having residual pain from the previous procedures by ENT.  May benefit with neuropathic medications.  I will defer to her PCP

## 2021-03-09 NOTE — Progress Notes (Signed)
   Subjective:    Patient ID: Sonya James, female    DOB: 06-May-1966, 55 y.o.   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 I saw her initially in early May with a prolonged course and no improvement with previous oral antibiotics.  Culture had grown Pseudomonas and I had started her on cefepime.  A culture by me also grew out MSSA.  In follow up about 1 week after starting, there was some improvement and plan for 3 weeks.  She had not followed up since completing antibiotics and her main complaint is continued pain in her left ear lobe and along the side of her head.  She also is concerned with issues of her heart after the picc line.  No chest pain.  No fever, no chills.     Review of Systems  Constitutional:  Negative for chills and fever.  Gastrointestinal:  Negative for diarrhea and nausea.  Skin:  Negative for rash.      Objective:   Physical Exam HENT:     Left Ear: Tympanic membrane normal.     Ears:     Comments: Her left ear lobe is soft, no warmth, no erythema No erythema in canal Eyes:     General: No scleral icterus. Pulmonary:     Effort: Pulmonary effort is normal.  Musculoskeletal:     Comments: Left arm a the site of the previous picc with a healing scab, about 1-2 mm.  No erythema, no clot felt, no fluctuance.    Skin:    Findings: No rash.  Neurological:     Mental Status: She is alert.   SH: + tobacco       Assessment & Plan:

## 2021-03-10 ENCOUNTER — Encounter: Payer: Self-pay | Admitting: Internal Medicine

## 2021-03-10 ENCOUNTER — Ambulatory Visit: Payer: Medicaid Other | Admitting: Gastroenterology

## 2021-03-15 ENCOUNTER — Ambulatory Visit: Payer: Medicaid Other | Admitting: Orthopaedic Surgery

## 2021-03-26 ENCOUNTER — Emergency Department (HOSPITAL_COMMUNITY)
Admission: EM | Admit: 2021-03-26 | Discharge: 2021-03-26 | Disposition: A | Discharge status: Home / Self Care | Payer: Medicaid Other | Attending: Emergency Medicine | Admitting: Emergency Medicine

## 2021-03-26 ENCOUNTER — Encounter (HOSPITAL_COMMUNITY): Payer: Self-pay | Admitting: Emergency Medicine

## 2021-03-26 ENCOUNTER — Emergency Department (HOSPITAL_COMMUNITY): Payer: Medicaid Other

## 2021-03-26 ENCOUNTER — Other Ambulatory Visit: Payer: Self-pay

## 2021-03-26 DIAGNOSIS — R220 Localized swelling, mass and lump, head: Secondary | ICD-10-CM | POA: Diagnosis not present

## 2021-03-26 DIAGNOSIS — Z8542 Personal history of malignant neoplasm of other parts of uterus: Secondary | ICD-10-CM | POA: Insufficient documentation

## 2021-03-26 DIAGNOSIS — I10 Essential (primary) hypertension: Secondary | ICD-10-CM | POA: Insufficient documentation

## 2021-03-26 DIAGNOSIS — Z8544 Personal history of malignant neoplasm of other female genital organs: Secondary | ICD-10-CM | POA: Insufficient documentation

## 2021-03-26 DIAGNOSIS — Z794 Long term (current) use of insulin: Secondary | ICD-10-CM | POA: Insufficient documentation

## 2021-03-26 DIAGNOSIS — H9202 Otalgia, left ear: Secondary | ICD-10-CM | POA: Diagnosis present

## 2021-03-26 DIAGNOSIS — H6092 Unspecified otitis externa, left ear: Secondary | ICD-10-CM | POA: Diagnosis not present

## 2021-03-26 DIAGNOSIS — H60392 Other infective otitis externa, left ear: Secondary | ICD-10-CM | POA: Diagnosis not present

## 2021-03-26 DIAGNOSIS — R519 Headache, unspecified: Secondary | ICD-10-CM | POA: Diagnosis not present

## 2021-03-26 DIAGNOSIS — K029 Dental caries, unspecified: Secondary | ICD-10-CM | POA: Diagnosis not present

## 2021-03-26 DIAGNOSIS — E1142 Type 2 diabetes mellitus with diabetic polyneuropathy: Secondary | ICD-10-CM | POA: Insufficient documentation

## 2021-03-26 DIAGNOSIS — J45909 Unspecified asthma, uncomplicated: Secondary | ICD-10-CM | POA: Insufficient documentation

## 2021-03-26 DIAGNOSIS — Z7984 Long term (current) use of oral hypoglycemic drugs: Secondary | ICD-10-CM | POA: Diagnosis not present

## 2021-03-26 DIAGNOSIS — R590 Localized enlarged lymph nodes: Secondary | ICD-10-CM | POA: Diagnosis not present

## 2021-03-26 DIAGNOSIS — Z79899 Other long term (current) drug therapy: Secondary | ICD-10-CM | POA: Insufficient documentation

## 2021-03-26 DIAGNOSIS — F17210 Nicotine dependence, cigarettes, uncomplicated: Secondary | ICD-10-CM | POA: Insufficient documentation

## 2021-03-26 DIAGNOSIS — M7989 Other specified soft tissue disorders: Secondary | ICD-10-CM | POA: Diagnosis not present

## 2021-03-26 DIAGNOSIS — H6692 Otitis media, unspecified, left ear: Secondary | ICD-10-CM | POA: Diagnosis not present

## 2021-03-26 DIAGNOSIS — J449 Chronic obstructive pulmonary disease, unspecified: Secondary | ICD-10-CM | POA: Diagnosis not present

## 2021-03-26 DIAGNOSIS — B9689 Other specified bacterial agents as the cause of diseases classified elsewhere: Secondary | ICD-10-CM | POA: Insufficient documentation

## 2021-03-26 DIAGNOSIS — Z7901 Long term (current) use of anticoagulants: Secondary | ICD-10-CM | POA: Insufficient documentation

## 2021-03-26 LAB — COMPREHENSIVE METABOLIC PANEL
ALT: 11 U/L (ref 0–44)
AST: 12 U/L — ABNORMAL LOW (ref 15–41)
Albumin: 3.5 g/dL (ref 3.5–5.0)
Alkaline Phosphatase: 89 U/L (ref 38–126)
Anion gap: 7 (ref 5–15)
BUN: 7 mg/dL (ref 6–20)
CO2: 25 mmol/L (ref 22–32)
Calcium: 9.6 mg/dL (ref 8.9–10.3)
Chloride: 106 mmol/L (ref 98–111)
Creatinine, Ser: 0.75 mg/dL (ref 0.44–1.00)
GFR, Estimated: >60 mL/min (ref >60)
Glucose, Bld: 291 mg/dL — ABNORMAL HIGH (ref 70–99)
Potassium: 3.8 mmol/L (ref 3.5–5.1)
Sodium: 138 mmol/L (ref 135–145)
Total Bilirubin: 0.7 mg/dL (ref 0.3–1.2)
Total Protein: 6.9 g/dL (ref 6.5–8.1)

## 2021-03-26 LAB — CBC WITH DIFFERENTIAL/PLATELET
Abs Immature Granulocytes: 0.01 10*3/uL (ref 0.00–0.07)
Basophils Absolute: 0 10*3/uL (ref 0.0–0.1)
Basophils Relative: 1 %
Eosinophils Absolute: 0.2 10*3/uL (ref 0.0–0.5)
Eosinophils Relative: 5 %
HCT: 42.8 % (ref 36.0–46.0)
Hemoglobin: 14.3 g/dL (ref 12.0–15.0)
Immature Granulocytes: 0 %
Lymphocytes Relative: 33 %
Lymphs Abs: 1.7 10*3/uL (ref 0.7–4.0)
MCH: 30.6 pg (ref 26.0–34.0)
MCHC: 33.4 g/dL (ref 30.0–36.0)
MCV: 91.6 fL (ref 80.0–100.0)
Monocytes Absolute: 0.3 10*3/uL (ref 0.1–1.0)
Monocytes Relative: 7 %
Neutro Abs: 2.8 10*3/uL (ref 1.7–7.7)
Neutrophils Relative %: 54 %
Platelets: 269 10*3/uL (ref 150–400)
RBC: 4.67 MIL/uL (ref 3.87–5.11)
RDW: 11.9 % (ref 11.5–15.5)
WBC: 5.1 10*3/uL (ref 4.0–10.5)
nRBC: 0 % (ref 0.0–0.2)

## 2021-03-26 MED ORDER — HYDROCODONE-ACETAMINOPHEN 5-325 MG PO TABS: 1.0000 | ORAL_TABLET | ORAL | 0 refills | Status: DC | PRN

## 2021-03-26 MED ORDER — IOHEXOL 300 MG/ML  SOLN
75.0000 mL | Freq: Once | INTRAMUSCULAR | Status: AC | PRN
  Administered 2021-03-26: 75 mL via INTRAVENOUS

## 2021-03-26 MED ORDER — SODIUM CHLORIDE 0.9 % IV SOLN
2.0000 g | Freq: Once | INTRAVENOUS | Status: AC
  Administered 2021-03-26: 2 g via INTRAVENOUS
  Filled 2021-03-26: qty 20

## 2021-03-26 MED ORDER — CIPROFLOXACIN HCL 500 MG PO TABS: 500.0000 mg | ORAL_TABLET | Freq: Two times a day (BID) | ORAL | 0 refills | Status: AC

## 2021-03-26 NOTE — ED Triage Notes (Signed)
Pt c/o left ear pain and drainage this morning. Pt states she had surgery on left ear for abscess in Feb 2022.

## 2021-03-26 NOTE — ED Provider Notes (Signed)
[employer] Provider Note   CSN: [id number] Arrival date & time: 03/26/21  1152     History Chief Complaint  Patient presents with   Ear Pain    Sonya James is a 55 y.o. female.  The history is provided by the patient.    Pt has a history of infection to left ear.  Pt reports she had a cyst removed.  Pt reports area became infected.  She was on oral antibiotics and then IV maxipime.  Pt was treated by Dr. Linus Salmons at the [facility].  Pt reports ear had stopped draining but has started again.  Pt reports she now has pain in her head and the scalp around her ear.  Pt has pain in her neck    Past Medical History:  Diagnosis Date   Anemia    Anxiety    Arthritis    Asthma    Asthma 05/25/2013   Autonomic neuropathy    Cancer of endometrium ([place]) 10/18/2015   Constipation    COPD (chronic obstructive pulmonary disease) ([place])    CTS (carpal tunnel syndrome)    Depression    Diabetes mellitus without complication ([place])    Dysphagia 12/14/2016   Encounter for screening colonoscopy    Endometrial cancer ([place]) 2017   Gastritis without bleeding    GERD (gastroesophageal reflux disease)    Gross hematuria 05/11/2019   Headache(784.0)    Heavy menses 10/11/2015   HTN (hypertension)    Hypercholesterolemia    Hypothyroidism    IBS (irritable bowel syndrome)    Osteoarthritis    osteoarthritis   Pancreatitis    per patient    Recurrent boils    buttocks and low back.   S/P laparoscopic hysterectomy 11/04/2015   Shortness of breath    Sleep apnea    CPAP machine   Uncontrolled type 2 diabetes mellitus with diabetic polyneuropathy, with long-term current use of insulin ([place]) 08/04/2017   Uterine cancer (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/2021   Skin  lesion of back 01/13/2021   Personal history of noncompliance with medical treatment, presenting hazards to health 11/16/2020   Skin lesion 11/11/2020   Left ear pain 10/28/2020   Long-term current use of opiate analgesic 04/08/2020   Assistance needed with transportation 03/28/2020   Lives in hotel 03/28/2020   Mixed hyperlipidemia 01/20/2020   Idiopathic peripheral neuropathy 12/11/2019   Pain in lower limb 12/11/2019   Restless legs 12/11/2019   Vitamin D deficiency 12/11/2019   Chronic low back pain 10/09/2019   Other spondylosis with radiculopathy, lumbar region 10/09/2019   Neck pain 05/22/2019   History of pulmonary embolism    Hypercalcemia 08/04/2017   Obesity, Class III, BMI 40-49.9 (morbid obesity) ([place]) 08/04/2017   Pulmonary embolism ([place]) 08/03/2017   Sleep apnea 08/03/2017   Diabetes mellitus ([place]) 10/25/2015   Chronic obstructive pulmonary disease ([place]) 05/25/2013   Tobacco abuse 05/25/2013   Essential hypertension, benign 05/25/2013   Thyromegaly 05/25/2013   Constipation 04/11/2011   GERD (gastroesophageal reflux disease) 04/11/2011    Past Surgical History:  Procedure Laterality Date   ABDOMINAL HYSTERECTOMY  2018   endometrial cancer, surgery done at [facility]  01/02/2017   Procedure: BIOPSY;  Surgeon: Marga Melnick  Fields, MD;  Location: [facility];  Service: Endoscopy;;  gastric biopsy   CESAREAN SECTION  1989   COLONOSCOPY WITH PROPOFOL N/A 01/02/2017   Dr. Oneida Alar: redundant colon, two 2-3 mm polyps in sigmoid colon (hyperplastic)   ESOPHAGOGASTRODUODENOSCOPY  05/22/2011   Dr. Oneida Alar: H.pylori gastritis    ESOPHAGOGASTRODUODENOSCOPY (EGD) WITH PROPOFOL N/A 01/02/2017   Dr. 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 s/p dilation, moderate gastritis (negative H.pylori)   FOOT SURGERY Left    tendon repair   IR US GUIDE VASC ACCESS LEFT  01/18/2021   OOPHORECTOMY     POLYPECTOMY  01/02/2017   Procedure: POLYPECTOMY;  Surgeon: Danie Binder, MD;   Location: [facility];  Service: Endoscopy;;  sigmoid colon polyps times 2   REDUCTION MAMMAPLASTY  1996   SAVORY DILATION N/A 01/02/2017   Procedure: SAVORY DILATION;  Surgeon: Danie Binder, MD;  Location: [facility];  Service: Endoscopy;  Laterality: N/A;   TUBAL LIGATION       OB History     Gravida  1   Para  1   Term  1   Preterm      AB      Living  1      SAB      IAB      Ectopic      Multiple      Live Births  1           Family History  Problem Relation Age of Onset   Diabetes Mother    Hypertension Mother    COPD Mother    Asthma Mother    Hypercholesterolemia Mother    Hypertension Sister    Hyperlipidemia Sister    Diabetes Sister    Asthma Brother    Alcohol abuse Brother    Asthma Son    Cancer Maternal Grandmother        lung, throat, breast   Alzheimer's disease Maternal Grandmother    Hypertension Maternal Grandmother    Hypercholesterolemia Maternal Grandmother    Heart disease Maternal Grandfather    Stroke Maternal Grandfather    Clotting disorder Maternal Grandfather        blood clots in legs   Hypertension Sister    Hyperlipidemia Sister    Stroke Maternal Aunt    Clotting disorder Maternal Aunt        blood clots in legs   Hypertension Maternal Aunt    Hypercholesterolemia Maternal Aunt    Hypertension Maternal Aunt    Asthma Maternal Aunt    Clotting disorder Maternal Uncle        blood clot -legs traveled to heart and he died of heart attack   Colon cancer Neg Hx     Social History   Tobacco Use   Smoking status: Some Days    Packs/day: 0.50    Years: 22.00    Pack years: 11.00    Types: Cigarettes   Smokeless tobacco: Never  Vaping Use   Vaping Use: Never used  Substance Use Topics   Alcohol use: No   Drug use: No    Home Medications Prior to Admission medications   Medication Sig Start Date End Date Taking? Authorizing Provider  Accu-Chek Softclix Lancets lancets To test glucose 4 times a  day 11/17/20   Cassandria Anger, MD  albuterol (PROVENTIL) (2.5 MG/3ML) 0.083% nebulizer solution Take 3 mLs (2.5 mg total) by nebulization every 6 (six) hours as needed for wheezing. Patient not  taking: Reported on 02/22/2021 08/21/19   Maryruth Hancock, MD  albuterol (VENTOLIN HFA) 108 (90 Base) MCG/ACT inhaler Inhale 2 puffs into the lungs every 6 (six) hours as needed for wheezing or shortness of breath. Patient not taking: Reported on 02/22/2021 08/21/19   Maryruth Hancock, MD  apixaban (ELIQUIS) 5 MG TABS tablet Take 1 tablet (5 mg total) by mouth 2 (two) times daily. On 08/12/17 @ 9PM, start 1 tab (5 mg) two times daily. 10/28/20   Perlie Mayo, NP  atorvastatin (LIPITOR) 80 MG tablet Take 1 tablet by mouth once daily 01/06/21   Lindell Spar, MD  Blood Glucose Monitoring Suppl (ACCU-CHEK GUIDE ME) w/Device KIT 1 Piece by Does not apply route as directed. 11/16/20   Cassandria Anger, MD  ceFEPIme (MAXIPIME) 2 g injection Inject 2 g into the muscle 3 (three) times daily. Patient not taking: Reported on 03/09/2021 01/13/21   Thayer Headings, MD  DULoxetine (CYMBALTA) 60 MG capsule Take 60 mg by mouth daily. 01/17/21   [provider]  esomeprazole (NEXIUM) 40 MG capsule Take 1 capsule (40 mg total) by mouth 2 (two) times daily before a meal. 05/27/20   Annitta Needs, NP  furosemide (LASIX) 40 MG tablet Take 1 tablet by mouth once daily 01/05/21   Lindell Spar, MD  glipiZIDE (GLUCOTROL XL) 5 MG 24 hr tablet Take 1 tablet by mouth once daily with breakfast 03/03/21   Noreene Larsson, NP  glucose blood (ACCU-CHEK GUIDE) test strip Test glucose 4 times a day 11/17/20   Cassandria Anger, MD  HYDROcodone-acetaminophen (NORCO) 7.5-325 MG tablet Take 1 tablet by mouth 3 (three) times daily as needed. Patient not taking: Reported on 02/22/2021 01/17/21   [provider]  hydrocortisone (ANUSOL-HC) 2.5 % rectal cream Place 1 application rectally 2 (two) times daily. As needed for itching  05/27/20   Annitta Needs, NP  insulin NPH-regular Human (NOVOLIN 70/30 RELION) (70-30) 100 UNIT/ML injection Inject 60 Units into the skin 2 (two) times daily with a meal. 11/16/20   Nida, Marella Chimes, MD  Insulin Pen Needle (B-D ULTRAFINE III SHORT PEN) 31G X 8 MM MISC Use to inject insulin 2 times daily. 11/17/20   Cassandria Anger, MD  linaclotide Rolan Lipa) 290 MCG CAPS capsule Take 1 capsule (290 mcg total) by mouth daily before breakfast. 05/27/20   Annitta Needs, NP  lisinopril (ZESTRIL) 40 MG tablet Take 1 tablet by mouth once daily 03/03/21   Fayrene Helper, MD  metFORMIN (GLUCOPHAGE) 1000 MG tablet Take 1 tablet (1,000 mg total) by mouth 2 (two) times daily with a meal. 11/16/20   Nida, Marella Chimes, MD  methocarbamol (ROBAXIN) 500 MG tablet Take 1 tablet (500 mg total) by mouth 2 (two) times daily. 08/20/19   Kendrick, Caitlyn S, PA-C  montelukast (SINGULAIR) 10 MG tablet TAKE 1 TABLET BY MOUTH AT BEDTIME 03/03/21   Lindell Spar, MD  ofloxacin (FLOXIN) 0.3 % OTIC solution Place 5 drops into the left ear 2 (two) times daily. 06/08/20   Lindell Spar, MD  ondansetron (ZOFRAN-ODT) 4 MG disintegrating tablet Take 4 mg by mouth as needed for nausea or vomiting. 10/11/15   [provider]  potassium chloride (KLOR-CON) 10 MEQ tablet TAKE 1 TABLET BY MOUTH ONCE DAILY IN THE EVENING 11/22/20   Lindell Spar, MD  rizatriptan (MAXALT) 10 MG tablet Take 10 mg by mouth daily as needed. 01/05/21   [provider]  silver sulfADIAZINE (SILVADENE) 1 % cream Apply 1 application topically daily. 07/03/18   Jonnie Kind, MD  topiramate (TOPAMAX) 25 MG tablet Take by mouth. 01/17/21   [provider]  Vitamin D, Ergocalciferol, (DRISDOL) 1.25 MG (50000 UNIT) CAPS capsule TAKE 1 CAPSULE BY MOUTH ONCE A WEEK (EVERY 7 DAYS) 11/22/20   Noreene Larsson, NP    Allergies    Patient has no known allergies.  Review of Systems   Review of Systems  All other systems reviewed and  are negative.  Physical Exam Updated Vital Signs BP (!) 158/91   Pulse (!) 52   Temp 98.6 F (37 C) (Oral)   Resp 19   Ht 5' 5" (1.651 m)   Wt 104.3 kg   LMP 09/20/2015 (Exact Date)   SpO2 100%   BMI 38.27 kg/m   Physical Exam Vitals reviewed.  Constitutional:      Appearance: Normal appearance.  HENT:     Head: Normocephalic.     Ears:     Comments: Swollen deformed appearing left ear.  Purulent drainage.   Swelling around area,  scalp and neck tender      Mouth/Throat:     Mouth: Mucous membranes are moist.  Eyes:     Pupils: Pupils are equal, round, and reactive to light.  Cardiovascular:     Rate and Rhythm: Normal rate.  Pulmonary:     Effort: Pulmonary effort is normal.  Abdominal:     General: Abdomen is flat.  Musculoskeletal:        General: Normal range of motion.     Cervical back: Neck supple. Tenderness present.  Skin:    General: Skin is warm.  Neurological:     General: No focal deficit present.     Mental Status: She is alert.  Psychiatric:        Mood and Affect: Mood normal.    ED Results / Procedures / Treatments   Labs (all labs ordered are listed, but only abnormal results are displayed) Labs Reviewed  COMPREHENSIVE METABOLIC PANEL - Abnormal; Notable for the following components:      Result Value   Glucose, Bld 291 (*)    AST 12 (*)    All other components within normal limits  CBC WITH DIFFERENTIAL/PLATELET    EKG None  Radiology CT Head W or Wo Contrast  Result Date: 03/26/2021 CLINICAL DATA:  Left ear and scalp infection with drainage. Rule out intracranial abscess. EXAM: CT HEAD WITHOUT AND WITH CONTRAST TECHNIQUE: Contiguous axial images were obtained from the base of the skull through the vertex without and with intravenous contrast CONTRAST:  29m OMNIPAQUE IOHEXOL 300 MG/ML  SOLN COMPARISON:  None. FINDINGS: Brain: No evidence of acute infarction, hemorrhage, hydrocephalus, extra-axial collection or mass lesion/mass  effect. Normal enhancement.  No evidence of abscess or edema in the brain. Vascular: Negative for hyperdense vessel. Normal vascular enhancement. Normal dural venous enhancement. Skull: Negative Sinuses/Orbits: Negative Other: Diffuse soft tissue swelling of the left ear. Focal rim enhancing fluid collection within the ear above the external canal could represent a small abscess measuring approximately 5 mm in diameter. IMPRESSION: Normal CT of the brain with contrast. No intracranial abscess or edema Diffuse soft tissue swelling of the left ear. 5 mm nonenhancing focus within the ear above the external canal could represent inflammation or abscess. Electronically Signed   By: CFranchot GalloM.D.   On: 03/26/2021 14:20   CT Soft Tissue Neck W  Contrast  Result Date: 03/26/2021 CLINICAL DATA:  Left ear infection with drainage. Surgery for left ear abscess drainage February 2022. EXAM: CT NECK WITH CONTRAST TECHNIQUE: Multidetector CT imaging of the neck was performed using the standard protocol following the bolus administration of intravenous contrast. CONTRAST:  62m OMNIPAQUE IOHEXOL 300 MG/ML  SOLN COMPARISON:  CT neck 10/18/2020 FINDINGS: Pharynx and larynx: Normal. No mass or swelling. Tonsilliths on the left unchanged. Salivary glands: No inflammation, mass, or stone. Thyroid: Negative Lymph nodes: Cluster of subcentimeter lymph nodes posterior to the sternocleidomastoid muscle in the left upper neck likely reactive due to ear infection. These are slightly larger compared to the prior study. No nodal necrosis or pathologically enlarged nodes. Vascular: Normal vascular enhancement. Limited intracranial: Negative Visualized orbits: Negative Mastoids and visualized paranasal sinuses: Paranasal sinuses clear. Mastoid clear bilaterally. Skeleton: Dental caries. No acute skeletal abnormality. No bony erosion involving the temporal bone on the left. Upper chest: Lung apices clear bilaterally. Other: Soft tissue  swelling of the left ear with heterogeneous enhancement. There is an area of ill-defined hypoenhancing tissue superior and posterior to the external canal. This is similar to the prior study likely represents chronic inflammation. No definite abscess. IMPRESSION: Persistent soft tissue swelling left ear similar to the prior CT of 10/18/2020. Findings compatible with chronic infection/inflammation. No drainable abscess identified. Mild reactive adenopathy in the left upper neck. Electronically Signed   By: CFranchot GalloM.D.   On: 03/26/2021 14:30    Procedures Procedures   Medications Ordered in ED Medications  cefTRIAXone (ROCEPHIN) 2 g in sodium chloride 0.9 % 100 mL IVPB (0 g Intravenous Stopped 03/26/21 1336)  iohexol (OMNIPAQUE) 300 MG/ML solution 75 mL (75 mLs Intravenous Contrast Given 03/26/21 1337)    ED Course  I have reviewed the triage vital signs and the nursing notes.  Pertinent labs & imaging results that were available during my care of the patient were reviewed by me and considered in my medical decision making (see chart for details).    MDM Rules/Calculators/A&P                          MDM:  Pt given  IV Rocephin.  Ct scan obtained.  Ct head no acute abnormality, Pt started on cipro.  I paged ENT.  Pt does not want to wait.  She is scheduled to see Ent next week.  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    SFransico Meadow PA-C 03/27/21 1031    ZMilton Ferguson MD 03/28/21 [phone]

## 2021-03-26 NOTE — Discharge Instructions (Addendum)
Call Dr. Linus Salmons and Dr. Hayden Rasmussen on Monday.  Take antibiotics as directed

## 2021-03-28 ENCOUNTER — Telehealth: Payer: Self-pay

## 2021-03-28 NOTE — Telephone Encounter (Signed)
Transition Care Management Unsuccessful Follow-up Telephone Call  Date of discharge and from where:  03/26/2021-[facility] ED   Attempts:  1st Attempt  Reason for unsuccessful TCM follow-up call:  Unable to reach patient

## 2021-03-29 ENCOUNTER — Telehealth: Payer: Self-pay | Admitting: Family Medicine

## 2021-03-29 NOTE — Telephone Encounter (Signed)
..   Medicaid Managed Care   Unsuccessful Outreach Note  03/29/2021 Name: Sonya James MRN: [id number] DOB: 1966/03/22  Referred by: Perlie Mayo, NP Reason for referral : High Risk Managed Medicaid (Attempted to reach this patient today to get her appt with the [facility] RNCM rescheduled. She did not answer and there was not a VM.)   An unsuccessful telephone outreach was attempted today. The patient was referred to the case management team for assistance with care management and care coordination.   Follow Up Plan: The care management team will reach out to the patient again over the next 7-14 days.   [place]

## 2021-03-29 NOTE — Telephone Encounter (Signed)
Transition Care Management Unsuccessful Follow-up Telephone Call  Date of discharge and from where:  03/26/2021[facility]   Attempts:  2nd Attempt  Reason for unsuccessful TCM follow-up call:  Unable to reach patient

## 2021-03-30 NOTE — Telephone Encounter (Signed)
Transition Care Management Unsuccessful Follow-up Telephone Call  Date of discharge and from where:  03/26/2021[facility]   Attempts:  3rd Attempt  Reason for unsuccessful TCM follow-up call:  Unable to reach patient

## 2021-04-05 DIAGNOSIS — H61032 Chondritis of left external ear: Secondary | ICD-10-CM | POA: Diagnosis not present

## 2021-04-05 DIAGNOSIS — H6192 Disorder of left external ear, unspecified: Secondary | ICD-10-CM | POA: Diagnosis not present

## 2021-04-11 DIAGNOSIS — M542 Cervicalgia: Secondary | ICD-10-CM | POA: Diagnosis not present

## 2021-04-11 DIAGNOSIS — E1140 Type 2 diabetes mellitus with diabetic neuropathy, unspecified: Secondary | ICD-10-CM | POA: Diagnosis not present

## 2021-04-11 DIAGNOSIS — M79606 Pain in leg, unspecified: Secondary | ICD-10-CM | POA: Diagnosis not present

## 2021-04-11 DIAGNOSIS — M25569 Pain in unspecified knee: Secondary | ICD-10-CM | POA: Diagnosis not present

## 2021-04-11 DIAGNOSIS — Z79891 Long term (current) use of opiate analgesic: Secondary | ICD-10-CM | POA: Diagnosis not present

## 2021-04-11 DIAGNOSIS — M545 Low back pain, unspecified: Secondary | ICD-10-CM | POA: Diagnosis not present

## 2021-04-11 DIAGNOSIS — G43701 Chronic migraine without aura, not intractable, with status migrainosus: Secondary | ICD-10-CM | POA: Diagnosis not present

## 2021-04-11 DIAGNOSIS — Z599 Problem related to housing and economic circumstances, unspecified: Secondary | ICD-10-CM | POA: Diagnosis not present

## 2021-04-12 ENCOUNTER — Other Ambulatory Visit: Payer: Self-pay | Admitting: Nurse Practitioner

## 2021-04-12 ENCOUNTER — Other Ambulatory Visit: Payer: Self-pay | Admitting: Family Medicine

## 2021-04-12 ENCOUNTER — Other Ambulatory Visit: Payer: Self-pay | Admitting: Internal Medicine

## 2021-04-12 NOTE — Telephone Encounter (Signed)
It looks liek she gets this from Dr. Dorris Fetch. He sent in 90 day supplies. I think we just sent in a emergency 30-day supply. Before I refuse to fill it, can you call and make sue she ahs this at home already?

## 2021-04-19 NOTE — Progress Notes (Signed)
Patient ID: Sonya James, female   DOB: May 08, 1966, 55 y.o.   MRN: [id number] Message from Dr Linus Salmons to schedule patient for a follow up visit for her recurrence of her ear infection  Called [name] no answer  unable to leave message

## 2021-04-28 ENCOUNTER — Other Ambulatory Visit: Payer: Self-pay

## 2021-04-28 ENCOUNTER — Telehealth: Payer: Self-pay

## 2021-04-28 ENCOUNTER — Encounter: Payer: Self-pay | Admitting: Internal Medicine

## 2021-04-28 ENCOUNTER — Ambulatory Visit (INDEPENDENT_AMBULATORY_CARE_PROVIDER_SITE_OTHER): Payer: Medicaid Other | Admitting: Internal Medicine

## 2021-04-28 VITALS — BP 176/107 | HR 65 | Temp 98.3°F | Wt 233.0 lb

## 2021-04-28 DIAGNOSIS — H61032 Chondritis of left external ear: Secondary | ICD-10-CM

## 2021-04-28 DIAGNOSIS — Z720 Tobacco use: Secondary | ICD-10-CM

## 2021-04-28 DIAGNOSIS — Z452 Encounter for adjustment and management of vascular access device: Secondary | ICD-10-CM | POA: Diagnosis not present

## 2021-04-28 MED ORDER — CIPROFLOXACIN HCL 750 MG PO TABS: 750.0000 mg | ORAL_TABLET | Freq: Two times a day (BID) | ORAL | 0 refills | Status: DC

## 2021-04-28 MED ORDER — DAPTOMYCIN 500 MG IV SOLR: 800.0000 mg | Freq: Every day | INTRAVENOUS | 1 refills | Status: DC

## 2021-04-28 NOTE — Assessment & Plan Note (Signed)
Counseled on cessation 

## 2021-04-28 NOTE — Assessment & Plan Note (Signed)
Will refer to IR for a new picc line

## 2021-04-28 NOTE — Assessment & Plan Note (Addendum)
Worsened again and concern for reinfection.  She has had multiple species of bacteria grow in this with the most recent being Staph epidermidis.  Previously with Enterococcus, Pseudomonas.  At this point, I will have her get a new picc line and treat with IV daptomycin and oral ciprofloxacin.  Further surgical management per ENT and plan for debridment excisional biopsy.   Follow up in 1-2 weeks

## 2021-04-28 NOTE — Progress Notes (Signed)
   Subjective:    Patient ID: Sonya James, female    DOB: [birth year], 55 y.o.   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 She is followed by Dr. Fredric Dine of ENT for her ear drainage and previously drained with multiple positive cultures.  Itreated her with cefepime for 3 weeks for positive pseudomonas culture as well as MSSA. She missed her follow up appointments and the picc line was removed after 3 weeks and at follow up later, her infection appeared cleared.  Last month though she began to have pus-like drainage in her ear and went back to ENT with concern for infection.  It was recultured and now with Staph epidermidis, no sensitivities noted.  Plan is for more surgical debridment after the infection responds to antibiotics.  She is a smoker and diabetic with last Hgb A1c of 12.2.     Review of Systems  Constitutional:  Negative for fatigue.  Gastrointestinal:  Negative for diarrhea and nausea.  Skin:  Negative for rash.      Objective:   Physical Exam HENT:     Ears:     Comments: Left ear with edema, tenderness.  No drainage noted.   Pulmonary:     Effort: Pulmonary effort is normal.  Neurological:     General: No focal deficit present.     Mental Status: She is alert.  Psychiatric:        Mood and Affect: Mood normal.   SH: + tobacco       Assessment & Plan:

## 2021-04-28 NOTE — Telephone Encounter (Signed)
I spoke with Sonya James with IR and patient is scheduled for picc line placement on 05/04/21 at 12 pm. Patient informed of appointment and location. Order also faxed to short stay and to making the aware of patient's appointment with IR.   I have also spoke to [facility] with [facility] to giver her patient's IR appointment and order faxed to them as well. Sendy Pluta T Brooks Sailors

## 2021-04-29 LAB — SEDIMENTATION RATE: Sed Rate: 2 mm/h (ref 0–30)

## 2021-04-29 LAB — CK: Total CK: 46 U/L (ref 29–143)

## 2021-05-03 ENCOUNTER — Ambulatory Visit (INDEPENDENT_AMBULATORY_CARE_PROVIDER_SITE_OTHER): Payer: Medicaid Other | Admitting: Family Medicine

## 2021-05-03 ENCOUNTER — Other Ambulatory Visit: Payer: Self-pay

## 2021-05-03 ENCOUNTER — Ambulatory Visit: Payer: Medicaid Other | Admitting: Family Medicine

## 2021-05-03 ENCOUNTER — Encounter: Payer: Self-pay | Admitting: Family Medicine

## 2021-05-03 ENCOUNTER — Other Ambulatory Visit (HOSPITAL_COMMUNITY): Payer: Self-pay | Admitting: *Deleted

## 2021-05-03 VITALS — BP 150/90 | HR 77 | Temp 99.1°F | Resp 18 | Ht 61.0 in | Wt 235.0 lb

## 2021-05-03 DIAGNOSIS — Z794 Long term (current) use of insulin: Secondary | ICD-10-CM

## 2021-05-03 DIAGNOSIS — R7301 Impaired fasting glucose: Secondary | ICD-10-CM | POA: Diagnosis not present

## 2021-05-03 DIAGNOSIS — I10 Essential (primary) hypertension: Secondary | ICD-10-CM

## 2021-05-03 DIAGNOSIS — E1165 Type 2 diabetes mellitus with hyperglycemia: Secondary | ICD-10-CM | POA: Diagnosis not present

## 2021-05-03 DIAGNOSIS — L03211 Cellulitis of face: Secondary | ICD-10-CM | POA: Insufficient documentation

## 2021-05-03 DIAGNOSIS — Z720 Tobacco use: Secondary | ICD-10-CM | POA: Diagnosis not present

## 2021-05-03 HISTORY — DX: Cellulitis of face: L03.211

## 2021-05-03 MED ORDER — CEFTRIAXONE SODIUM 500 MG IJ SOLR
500.0000 mg | Freq: Once | INTRAMUSCULAR | Status: AC
  Administered 2021-05-03: 500 mg via INTRAMUSCULAR

## 2021-05-03 MED ORDER — AMLODIPINE BESYLATE 5 MG PO TABS: 5.0000 mg | ORAL_TABLET | Freq: Every day | ORAL | 3 refills | Status: DC

## 2021-05-03 MED ORDER — CLOTRIMAZOLE 2 % VA CREA: 1.0000 | TOPICAL_CREAM | Freq: Every day | VAGINAL | 0 refills | Status: DC

## 2021-05-03 NOTE — Assessment & Plan Note (Signed)
Uncontrolled HBA1C, and fwd to PCP , states she will return to Endo Sonya James is reminded of the importance of commitment to daily physical activity for 30 minutes or more, as able and the need to limit carbohydrate intake to 30 to 60 grams per meal to help with blood sugar control.   The need to take medication as prescribed, test blood sugar as directed, and to call between visits if there is a concern that blood sugar is uncontrolled is also discussed.   Sonya James is reminded of the importance of daily foot exam, annual eye examination, and good blood sugar, blood pressure and cholesterol control.  Diabetic Labs Latest Ref Rng & Units 03/26/2021 02/15/2021 11/01/2020 04/19/2020 01/20/2020  HbA1c 4.8 - 5.6 % - - 12.2(H) - 8.9(A)  Microalbumin Not Estab. ug/mL - - - - -  Chol 100 - 199 mg/dL - - 206(H) 210(H) -  HDL >39 mg/dL - - 42 40(L) -  Calc LDL 0 - 99 mg/dL - - 136(H) 151(H) -  Triglycerides 0 - 149 mg/dL - - 155(H) 96 -  Creatinine 0.44 - 1.00 mg/dL 0.75 0.71 1.00 0.86 -   BP/Weight 05/03/2021 04/28/2021 03/26/2021 03/09/2021 02/22/2021 01/25/2021 0000000  Systolic BP Q000111Q 0000000 0000000 Q000111Q 0000000 [id number]  Diastolic BP 90 [id number] 79 92 82 87 83  Wt. (Lbs) 235.04 233 230 238 243.2 241 -  BMI 44.41 38.77 38.27 39.61 40.47 40.1 -   No flowsheet data found.

## 2021-05-03 NOTE — Assessment & Plan Note (Signed)
Asked:confirms currently smokes cigarettes Assess: Unwilling to set a quit date Advise: needs to QUIT to reduce risk of cancer, cardio and cerebrovascular disease Assist: counseled for 5 minutes and literature provided Arrange: follow up in 2 to 4 months  

## 2021-05-03 NOTE — Progress Notes (Signed)
Sonya James     MRN: [id number]      DOB: [birth year]   HPI Sonya James is here c/p painful bump on right jaw x 1 week which got increasingly larger and painful, and burst in the last 3 days with pus draining. Stillvery tenmder and full. She is an uncontrolled diabetic, has upcoming left ear surgery which she will need Pic line placement for tomorrow, and is already recently started last week on double antibioittic treatment for this Complicated medical history includes uncontrolled diabetes, needs to return to Endo BOP reported high on review and is high today Cigarette smoker, no quit date I mind, counselled ROS ls. Denies sinus pressure, nasal congestion, ear pain or sore throat. Denies chest congestion, productive cough or wheezing. Denies chest pains, palpitations and leg swelling    Denies dysuria, frequency, hesitancy or incontinence. C/o chronic joint pain, swelling and limitation in mobility. . C/o  depression, .   PE  BP (!) 150/90   Pulse 77   Temp 99.1 F (37.3 C)   Resp 18   Ht '5\' 1"'$  (1.549 m)   Wt 235 lb 0.6 oz (106.6 kg)   LMP 09/20/2015 (Exact Date)   SpO2 95%   BMI 44.41 kg/m   Patient alert and oriented and in no cardiopulmonary distress.  HEENT: No facial asymmetry, EOMI,     Neck supple .cellulitis right lower jaw, abcess diameter approx 3 cm palpated, open   Chest: Clear to auscultation bilaterally.  CVS: S1, S2 no murmurs, no S3.Regular rate.  ABD: Soft non tender.   Ext: No edema  MS: decreased ROM spine and knees.  Skin: cellulitis and abcess right jaw  Psych: Good eye contact,anxious CNS: CN 2-12 intact, power,  normal throughout.no focal deficits noted.   Assessment & Plan  Essential hypertension, benign Uncontrolled Add amlodipine 5 mg daily DASH diet and commitment to daily physical activity for a minimum of 30 minutes discussed and encouraged, as a part of hypertension management. The importance of attaining a healthy weight  is also discussed.  BP/Weight 05/03/2021 04/28/2021 03/26/2021 03/09/2021 02/22/2021 01/25/2021 0000000  Systolic BP Q000111Q 0000000 0000000 Q000111Q 0000000 [id number]  Diastolic BP 90 [id number] 79 92 82 87 83  Wt. (Lbs) 235.04 233 230 238 243.2 241 -  BMI 44.41 38.77 38.27 39.61 40.47 40.1 -       Diabetes mellitus (HCC) Uncontrolled HBA1C, and fwd to PCP , states she will return to Endo Sonya James is reminded of the importance of commitment to daily physical activity for 30 minutes or more, as able and the need to limit carbohydrate intake to 30 to 60 grams per meal to help with blood sugar control.   The need to take medication as prescribed, test blood sugar as directed, and to call between visits if there is a concern that blood sugar is uncontrolled is also discussed.   Sonya James is reminded of the importance of daily foot exam, annual eye examination, and good blood sugar, blood pressure and cholesterol control.  Diabetic Labs Latest Ref Rng & Units 03/26/2021 02/15/2021 11/01/2020 04/19/2020 01/20/2020  HbA1c 4.8 - 5.6 % - - 12.2(H) - 8.9(A)  Microalbumin Not Estab. ug/mL - - - - -  Chol 100 - 199 mg/dL - - 206(H) 210(H) -  HDL >39 mg/dL - - 42 40(L) -  Calc LDL 0 - 99 mg/dL - - 136(H) 151(H) -  Triglycerides 0 - 149 mg/dL - - 155(H) 96 -  Creatinine  0.44 - 1.00 mg/dL 0.75 0.71 1.00 0.86 -   BP/Weight 05/03/2021 04/28/2021 03/26/2021 03/09/2021 02/22/2021 01/25/2021 0000000  Systolic BP Q000111Q 0000000 0000000 Q000111Q 0000000 [id number]  Diastolic BP 90 [id number] 79 92 82 87 83  Wt. (Lbs) 235.04 233 230 238 243.2 241 -  BMI 44.41 38.77 38.27 39.61 40.47 40.1 -   No flowsheet data found.      Tobacco abuse Asked:confirms currently smokes cigarettes Assess: Unwilling to set a quit date Advise: needs to QUIT to reduce risk of cancer, cardio and cerebrovascular disease Assist: counseled for 5 minutes and literature provided Arrange: follow up in 2 to 4 months   Cellulitis, face Rocephin 500 mg im in the office, she is to continue th  2 oral antibiotics currently takin and will have Pic placement in am for left ear procedure which is upcoming

## 2021-05-03 NOTE — Assessment & Plan Note (Signed)
Rocephin 500 mg im in the office, she is to continue th 2 oral antibiotics currently takin and will have Pic placement in am for left ear procedure which is upcoming

## 2021-05-03 NOTE — Patient Instructions (Addendum)
F/U with dr Posey Pronto as before, call if you need [place] sooner  Blood pressure is high, amlodipine 5 mg one daily is added, continue lisinopril as before  Vaginal cream prescribed for use if you develop itching from yeast infection on antibiotics  Rocephin 500 mg iM in the office today for infection of right jow , and all the best with upcoming procedure  Good that you are down to 10 ciggs/ day, continue to cut back with a view to quitting  Labs today HBA1C, TSH , copy to Dr Posey Pronto  Thanks for choosing [facility], we consider it a privelige to serve you.

## 2021-05-03 NOTE — Assessment & Plan Note (Signed)
Uncontrolled Add amlodipine 5 mg daily DASH diet and commitment to daily physical activity for a minimum of 30 minutes discussed and encouraged, as a part of hypertension management. The importance of attaining a healthy weight is also discussed.  BP/Weight 05/03/2021 04/28/2021 03/26/2021 03/09/2021 02/22/2021 01/25/2021 0000000  Systolic BP Q000111Q 0000000 0000000 Q000111Q 0000000 [id number]  Diastolic BP 90 [id number] 79 92 82 87 83  Wt. (Lbs) 235.04 233 230 238 243.2 241 -  BMI 44.41 38.77 38.27 39.61 40.47 40.1 -

## 2021-05-04 ENCOUNTER — Ambulatory Visit (HOSPITAL_COMMUNITY)
Admission: RE | Admit: 2021-05-04 | Discharge: 2021-05-04 | Disposition: A | Discharge status: Home / Self Care | Payer: Medicaid Other | Source: Ambulatory Visit | Attending: Internal Medicine | Admitting: Internal Medicine

## 2021-05-04 ENCOUNTER — Encounter (HOSPITAL_COMMUNITY)
Admission: RE | Admit: 2021-05-04 | Discharge: 2021-05-04 | Disposition: A | Discharge status: Home / Self Care | Payer: Medicaid Other | Source: Ambulatory Visit | Attending: Internal Medicine | Admitting: Internal Medicine

## 2021-05-04 DIAGNOSIS — H61032 Chondritis of left external ear: Secondary | ICD-10-CM | POA: Insufficient documentation

## 2021-05-04 DIAGNOSIS — Z452 Encounter for adjustment and management of vascular access device: Secondary | ICD-10-CM | POA: Diagnosis not present

## 2021-05-04 LAB — HEMOGLOBIN A1C
Est. average glucose Bld gHb Est-mCnc: 263 mg/dL
Hgb A1c MFr Bld: 10.8 % — ABNORMAL HIGH (ref 4.8–5.6)

## 2021-05-04 LAB — TSH: TSH: 1.53 u[IU]/mL (ref 0.450–4.500)

## 2021-05-04 MED ORDER — LIDOCAINE HCL (PF) 1 % IJ SOLN
INTRAMUSCULAR | Status: DC | PRN
  Administered 2021-05-04: 10 mL

## 2021-05-04 MED ORDER — LIDOCAINE HCL 1 % IJ SOLN
INTRAMUSCULAR | Status: AC
  Filled 2021-05-04: qty 20

## 2021-05-04 MED ORDER — HEPARIN SOD (PORK) LOCK FLUSH 100 UNIT/ML IV SOLN: 250.0000 [IU] | INTRAVENOUS | Status: DC | PRN

## 2021-05-04 MED ORDER — SODIUM CHLORIDE 0.9 % IV SOLN
800.0000 mg | Freq: Once | INTRAVENOUS | Status: AC
  Administered 2021-05-04: 800 mg via INTRAVENOUS
  Filled 2021-05-04: qty 16

## 2021-05-04 MED ORDER — HEPARIN SOD (PORK) LOCK FLUSH 100 UNIT/ML IV SOLN: 250.0000 [IU] | Freq: Every day | INTRAVENOUS | Status: DC

## 2021-05-04 MED ORDER — HEPARIN SOD (PORK) LOCK FLUSH 100 UNIT/ML IV SOLN
INTRAVENOUS | Status: AC
  Administered 2021-05-04: 250 [IU]
  Filled 2021-05-04: qty 5

## 2021-05-04 NOTE — Procedures (Signed)
PROCEDURE SUMMARY:  Successful placement of image-guided single lumen PICC line to the left brachial vein. Length 41 cm. Tip at lower SVC/RA. No complications. EBL = <3 ml. Ready for use.  Please see imaging section of Epic for full dictation.   Theresa Duty, NP 05/04/2021 12:35 PM

## 2021-05-05 DIAGNOSIS — H61002 Unspecified perichondritis of left external ear: Secondary | ICD-10-CM | POA: Diagnosis not present

## 2021-05-09 DIAGNOSIS — H61019 Acute perichondritis of external ear, unspecified ear: Secondary | ICD-10-CM | POA: Diagnosis not present

## 2021-05-09 DIAGNOSIS — H61002 Unspecified perichondritis of left external ear: Secondary | ICD-10-CM | POA: Diagnosis not present

## 2021-05-17 DIAGNOSIS — H61002 Unspecified perichondritis of left external ear: Secondary | ICD-10-CM | POA: Diagnosis not present

## 2021-05-19 ENCOUNTER — Telehealth: Payer: Self-pay

## 2021-05-19 NOTE — Telephone Encounter (Signed)
[facility] called office to follow up on CPK results. Would like to know if MD would like to continue antibiotics or d/c. Will forward message to MD and pharmacy team. Leatrice Jewels, [place]

## 2021-05-19 NOTE — Telephone Encounter (Signed)
Called [facility] back, but [facility] was not available. Left message to continue daptomycin for now unless Dr. Linus Salmons changes orders. Thanks!

## 2021-05-19 NOTE — Telephone Encounter (Signed)
[facility] called to report CPK of 250, up from 57. Routing to pharmacy.   Beryle Flock, RN

## 2021-05-19 NOTE — Telephone Encounter (Signed)
Thanks Megan!

## 2021-05-21 DIAGNOSIS — H61002 Unspecified perichondritis of left external ear: Secondary | ICD-10-CM | POA: Diagnosis not present

## 2021-05-23 ENCOUNTER — Encounter: Payer: Self-pay | Admitting: Internal Medicine

## 2021-05-23 ENCOUNTER — Other Ambulatory Visit: Payer: Self-pay | Admitting: Nurse Practitioner

## 2021-05-23 ENCOUNTER — Other Ambulatory Visit: Payer: Self-pay | Admitting: Internal Medicine

## 2021-05-23 ENCOUNTER — Ambulatory Visit (INDEPENDENT_AMBULATORY_CARE_PROVIDER_SITE_OTHER): Payer: Medicaid Other | Admitting: Internal Medicine

## 2021-05-23 ENCOUNTER — Other Ambulatory Visit: Payer: Self-pay

## 2021-05-23 ENCOUNTER — Telehealth: Payer: Self-pay

## 2021-05-23 VITALS — BP 154/85 | HR 70 | Temp 98.0°F | Wt 231.0 lb

## 2021-05-23 DIAGNOSIS — Z452 Encounter for adjustment and management of vascular access device: Secondary | ICD-10-CM | POA: Diagnosis not present

## 2021-05-23 DIAGNOSIS — H61032 Chondritis of left external ear: Secondary | ICD-10-CM

## 2021-05-23 DIAGNOSIS — Z5181 Encounter for therapeutic drug level monitoring: Secondary | ICD-10-CM

## 2021-05-23 NOTE — Assessment & Plan Note (Signed)
This is much improved on antibiotics and at this point, I think she is optimized for any surgical intervention from an ID standpoint.  I will have her continue with the antibiotics for now to assure continued clearance of the infection for another week and will discuss with Dr. Fredric Dine timing of surgery and stopping of antibiotics in the meantime.

## 2021-05-23 NOTE — Assessment & Plan Note (Signed)
Working well, no issues.

## 2021-05-23 NOTE — Assessment & Plan Note (Signed)
CK total was a bit elevated last week and will recheck again this week.  Otherwise she will continue with the daptomycin for now.

## 2021-05-23 NOTE — Progress Notes (Signed)
   Subjective:    Patient ID: Sonya James, female    DOB: 1966-03-19, 55 y.o.   MRN: [id number]  HPI Here for follow up chondritis She is followed by Dr. Fredric Dine of ENT and has had drainage and treated now x 2 with IV antibiotics.  She is here for follow up and has been on daptomycin + oral cipro and now since 05/04/21 and tolerating it well.  Her ear remains very tender but no further drainage at this time.  No new complaints.  No fever.  Picc line in left arm.  No issues with the picc line, working well.  Plan for further surgical debridement and excisional biopsy of the left auricular lesion and possible endaural meatoplasty.     Review of Systems  Constitutional:  Negative for chills and fever.  Gastrointestinal:  Negative for diarrhea.  Skin:  Negative for rash.      Objective:   Physical Exam HENT:     Ears:     Comments: Left ear with no pus, minimal erythema at the pinna, + tenderness throughout the ear.  Not able to look in the canal due to discomfort.   Neurological:     General: No focal deficit present.     Mental Status: She is alert.   SH: + tobacco       Assessment & Plan:

## 2021-05-23 NOTE — Telephone Encounter (Signed)
Per Dr. Linus Salmons called [facility] to discuss mutual patient. Left voicemail on CMA's voicemail requesting call back to office. Will try again at a later time.  Leatrice Jewels, RMA

## 2021-05-24 ENCOUNTER — Other Ambulatory Visit: Payer: Self-pay | Admitting: Otolaryngology

## 2021-05-24 ENCOUNTER — Other Ambulatory Visit: Payer: Self-pay | Admitting: Internal Medicine

## 2021-05-24 ENCOUNTER — Encounter: Payer: Self-pay | Admitting: Internal Medicine

## 2021-05-24 DIAGNOSIS — H61019 Acute perichondritis of external ear, unspecified ear: Secondary | ICD-10-CM | POA: Diagnosis not present

## 2021-05-24 DIAGNOSIS — H61002 Unspecified perichondritis of left external ear: Secondary | ICD-10-CM | POA: Diagnosis not present

## 2021-05-24 MED ORDER — CIPROFLOXACIN HCL 750 MG PO TABS: 750.0000 mg | ORAL_TABLET | Freq: Two times a day (BID) | ORAL | 0 refills | Status: DC

## 2021-05-25 ENCOUNTER — Other Ambulatory Visit (HOSPITAL_COMMUNITY): Payer: Self-pay

## 2021-05-25 ENCOUNTER — Telehealth: Payer: Self-pay | Admitting: Pharmacist

## 2021-05-25 NOTE — Telephone Encounter (Signed)
Sonya James from [facility] called to report critical lab for patient.   Patient is on daptomycin + oral cipro for chondritis. End date is up in the air at this time. CK was 57 on 8/29, 250 on 9/6, and yesterday was 1,211.   Will forward to Dr. Linus Salmons for advice.

## 2021-05-25 NOTE — Telephone Encounter (Signed)
Sounds good. I'll coordinate. Thank you!

## 2021-05-25 NOTE — Telephone Encounter (Signed)
Perfect. Thanks Dr. Linus Salmons!  Called and spoke to [place] at [facility]. Gave verbal order to stop daptomycin and start vancomycin with stop date on 06/20/21. Jeani Hawking verbalized understanding. She has [employer] and they will do first dose in the home. Called patient to update plan and tell her to continue the cipro, but there was no answer. No voicemail set up. Will try again later.

## 2021-05-25 NOTE — Telephone Encounter (Signed)
I just looked at her medications, and she is on Cymbalta, which is contraindicated with linezolid. Do you still want to do the 2 weeks after vanc?

## 2021-05-26 ENCOUNTER — Other Ambulatory Visit (HOSPITAL_COMMUNITY): Payer: Self-pay | Admitting: Internal Medicine

## 2021-05-26 ENCOUNTER — Other Ambulatory Visit (HOSPITAL_COMMUNITY): Payer: Self-pay | Admitting: Emergency Medicine

## 2021-05-26 DIAGNOSIS — H61002 Unspecified perichondritis of left external ear: Secondary | ICD-10-CM | POA: Diagnosis not present

## 2021-05-26 DIAGNOSIS — Z1231 Encounter for screening mammogram for malignant neoplasm of breast: Secondary | ICD-10-CM

## 2021-05-30 DIAGNOSIS — H61002 Unspecified perichondritis of left external ear: Secondary | ICD-10-CM | POA: Diagnosis not present

## 2021-05-30 DIAGNOSIS — H61019 Acute perichondritis of external ear, unspecified ear: Secondary | ICD-10-CM | POA: Diagnosis not present

## 2021-06-02 ENCOUNTER — Ambulatory Visit: Payer: Medicaid Other | Admitting: Orthopaedic Surgery

## 2021-06-04 DIAGNOSIS — H61002 Unspecified perichondritis of left external ear: Secondary | ICD-10-CM | POA: Diagnosis not present

## 2021-06-06 ENCOUNTER — Ambulatory Visit (HOSPITAL_COMMUNITY)
Admission: RE | Admit: 2021-06-06 | Discharge: 2021-06-06 | Disposition: A | Discharge status: Home / Self Care | Payer: Medicaid Other | Source: Ambulatory Visit | Attending: Internal Medicine | Admitting: Internal Medicine

## 2021-06-06 ENCOUNTER — Other Ambulatory Visit: Payer: Self-pay

## 2021-06-06 DIAGNOSIS — Z1231 Encounter for screening mammogram for malignant neoplasm of breast: Secondary | ICD-10-CM | POA: Diagnosis not present

## 2021-06-06 DIAGNOSIS — H61002 Unspecified perichondritis of left external ear: Secondary | ICD-10-CM | POA: Diagnosis not present

## 2021-06-06 LAB — HM DIABETES EYE EXAM

## 2021-06-07 ENCOUNTER — Ambulatory Visit: Payer: Medicaid Other | Admitting: Orthopaedic Surgery

## 2021-06-09 ENCOUNTER — Encounter (HOSPITAL_COMMUNITY): Payer: Self-pay | Admitting: Emergency Medicine

## 2021-06-09 ENCOUNTER — Emergency Department (HOSPITAL_COMMUNITY): Payer: Medicaid Other

## 2021-06-09 ENCOUNTER — Emergency Department (HOSPITAL_COMMUNITY)
Admission: EM | Admit: 2021-06-09 | Discharge: 2021-06-09 | Disposition: A | Discharge status: Home / Self Care | Payer: Medicaid Other | Attending: Emergency Medicine | Admitting: Emergency Medicine

## 2021-06-09 ENCOUNTER — Emergency Department (EMERGENCY_DEPARTMENT_HOSPITAL): Payer: Medicaid Other

## 2021-06-09 DIAGNOSIS — Z7901 Long term (current) use of anticoagulants: Secondary | ICD-10-CM | POA: Diagnosis not present

## 2021-06-09 DIAGNOSIS — J45909 Unspecified asthma, uncomplicated: Secondary | ICD-10-CM | POA: Diagnosis not present

## 2021-06-09 DIAGNOSIS — T82898A Other specified complication of vascular prosthetic devices, implants and grafts, initial encounter: Secondary | ICD-10-CM | POA: Diagnosis not present

## 2021-06-09 DIAGNOSIS — Z8541 Personal history of malignant neoplasm of cervix uteri: Secondary | ICD-10-CM | POA: Diagnosis not present

## 2021-06-09 DIAGNOSIS — E039 Hypothyroidism, unspecified: Secondary | ICD-10-CM | POA: Insufficient documentation

## 2021-06-09 DIAGNOSIS — Z7984 Long term (current) use of oral hypoglycemic drugs: Secondary | ICD-10-CM | POA: Diagnosis not present

## 2021-06-09 DIAGNOSIS — Z794 Long term (current) use of insulin: Secondary | ICD-10-CM | POA: Insufficient documentation

## 2021-06-09 DIAGNOSIS — I10 Essential (primary) hypertension: Secondary | ICD-10-CM | POA: Diagnosis not present

## 2021-06-09 DIAGNOSIS — F17210 Nicotine dependence, cigarettes, uncomplicated: Secondary | ICD-10-CM | POA: Diagnosis not present

## 2021-06-09 DIAGNOSIS — M79622 Pain in left upper arm: Secondary | ICD-10-CM | POA: Diagnosis not present

## 2021-06-09 DIAGNOSIS — Z79899 Other long term (current) drug therapy: Secondary | ICD-10-CM | POA: Insufficient documentation

## 2021-06-09 DIAGNOSIS — J449 Chronic obstructive pulmonary disease, unspecified: Secondary | ICD-10-CM | POA: Insufficient documentation

## 2021-06-09 DIAGNOSIS — E119 Type 2 diabetes mellitus without complications: Secondary | ICD-10-CM | POA: Insufficient documentation

## 2021-06-09 DIAGNOSIS — H61002 Unspecified perichondritis of left external ear: Secondary | ICD-10-CM | POA: Diagnosis not present

## 2021-06-09 DIAGNOSIS — X58XXXA Exposure to other specified factors, initial encounter: Secondary | ICD-10-CM | POA: Diagnosis not present

## 2021-06-09 LAB — CBC WITH DIFFERENTIAL/PLATELET
Abs Immature Granulocytes: 0.02 10*3/uL (ref 0.00–0.07)
Basophils Absolute: 0 10*3/uL (ref 0.0–0.1)
Basophils Relative: 1 %
Eosinophils Absolute: 0.4 10*3/uL (ref 0.0–0.5)
Eosinophils Relative: 5 %
HCT: 41.5 % (ref 36.0–46.0)
Hemoglobin: 13.4 g/dL (ref 12.0–15.0)
Immature Granulocytes: 0 %
Lymphocytes Relative: 30 %
Lymphs Abs: 2.2 10*3/uL (ref 0.7–4.0)
MCH: 30.2 pg (ref 26.0–34.0)
MCHC: 32.3 g/dL (ref 30.0–36.0)
MCV: 93.7 fL (ref 80.0–100.0)
Monocytes Absolute: 0.4 10*3/uL (ref 0.1–1.0)
Monocytes Relative: 5 %
Neutro Abs: 4.4 10*3/uL (ref 1.7–7.7)
Neutrophils Relative %: 59 %
Platelets: 351 10*3/uL (ref 150–400)
RBC: 4.43 MIL/uL (ref 3.87–5.11)
RDW: 12.2 % (ref 11.5–15.5)
WBC: 7.3 10*3/uL (ref 4.0–10.5)
nRBC: 0 % (ref 0.0–0.2)

## 2021-06-09 LAB — COMPREHENSIVE METABOLIC PANEL
ALT: 11 U/L (ref 0–44)
AST: 13 U/L — ABNORMAL LOW (ref 15–41)
Albumin: 3.5 g/dL (ref 3.5–5.0)
Alkaline Phosphatase: 100 U/L (ref 38–126)
Anion gap: 9 (ref 5–15)
BUN: 7 mg/dL (ref 6–20)
CO2: 24 mmol/L (ref 22–32)
Calcium: 9.7 mg/dL (ref 8.9–10.3)
Chloride: 105 mmol/L (ref 98–111)
Creatinine, Ser: 0.9 mg/dL (ref 0.44–1.00)
GFR, Estimated: >60 mL/min (ref >60)
Glucose, Bld: 333 mg/dL — ABNORMAL HIGH (ref 70–99)
Potassium: 3.5 mmol/L (ref 3.5–5.1)
Sodium: 138 mmol/L (ref 135–145)
Total Bilirubin: 0.8 mg/dL (ref 0.3–1.2)
Total Protein: 6.8 g/dL (ref 6.5–8.1)

## 2021-06-09 LAB — PROTIME-INR
INR: 1 (ref 0.8–1.2)
Prothrombin Time: 12.9 seconds (ref 11.4–15.2)

## 2021-06-09 NOTE — Progress Notes (Signed)
VASCULAR LAB    Left upper extremity venous duplex has been performed.  See CV proc for preliminary results.  Messaged results to Quentin Mulling, PA-C and Cyndia Diver, RN via secure chat.  Gayleen Sholtz, RVT 06/09/2021, 11:46 AM

## 2021-06-09 NOTE — Discharge Instructions (Signed)
Follow-up with Dr. Linus Salmons as you have planned.  We were able to flush your PICC line, and able to draw blood off of it today.  Please continue your antibiotics as planned.  Please continue the surgery to you have planned on the fifth.  Please do not hesitate return if you continue to have issues with the line.  It was a pleasure taking care of you today.

## 2021-06-09 NOTE — ED Provider Notes (Signed)
Emergency Medicine Provider Triage Evaluation Note  Sonya James , a 55 y.o. female  was evaluated in triage.  Pt complains of clot in PICC line of left arm. Patient has PICC line in place 2/2 infection in left ear. Patient also endorses hx of COPD. They tried to inject something (heparin?) into the line this morning but were unable to clear blockage.  Review of Systems  Positive: arm swelling, pain, SOB Negative: Chest pain, hemoptysis, fever  Physical Exam  BP (!) 175/87 (BP Location: Right Arm)   Pulse 63   Temp 98.9 F (37.2 C) (Oral)   Resp 16   LMP 09/20/2015 (Exact Date)   SpO2 100%  Gen:   Awake, no distress   Resp:  Normal effort  MSK:   Moves extremities without difficulty  Other:  Left picc line in place with some swelling and tenderness around the site of insertion  Medical Decision Making  Medically screening exam initiated at 10:55 AM.  Appropriate orders placed.  ANGELISSA SUPAN was informed that the remainder of the evaluation will be completed by another provider, this initial triage assessment does not replace that evaluation, and the importance of remaining in the ED until their evaluation is complete.  PICC line clot / issue   Anselmo Pickler, PA-C 06/09/21 1057    Gareth Morgan, MD 06/09/21 1254

## 2021-06-09 NOTE — ED Triage Notes (Signed)
Pt reports her home RN was unable to get labs or flush her PICC line. States she was able to give her meds through it this morning. Sent to make sure there is no blood clot in left arm.

## 2021-06-09 NOTE — ED Provider Notes (Signed)
[facility] EMERGENCY DEPARTMENT Provider Note   CSN: [id number] Arrival date & time: 06/09/21  1025     History No chief complaint on file.   Sonya James is a 55 y.o. female with medical history of a complicated left ear infection since 2021 who has had multiple PICC lines placed for intravenous antibiotics.  She was initially on oral antibiotics, IV Maxipime.  Patient then was transitioned to Dr. De Burrs at the [facility].  Patient has had some draining of urine in the past.  Patient is receiving IV vancomycin and Cipro infusions twice a day.  Patient had a home health nurse who came by today, and reported that she was unable to draw blood off of her line, and reported that it was clogged.  Patient reports she still was able to inject her line with antibiotics this morning without difficulty.  Patient has some redness, swelling, radiation of pain into her axillary vault, that has been present for around 2 weeks.   HPI     Past Medical History:  Diagnosis Date   Anemia    Anxiety    Arthritis    Asthma    Asthma 05/25/2013   Autonomic neuropathy    Cancer of endometrium ([place]) 10/18/2015   Constipation    COPD (chronic obstructive pulmonary disease) (HCC)    CTS (carpal tunnel syndrome)    Depression    Diabetes mellitus without complication ([place])    Dysphagia 12/14/2016   Encounter for screening colonoscopy    Endometrial cancer ([place]) 2017   Gastritis without bleeding    GERD (gastroesophageal reflux disease)    Gross hematuria 05/11/2019   Headache(784.0)    Heavy menses 10/11/2015   HTN (hypertension)    Hypercholesterolemia    Hypothyroidism    IBS (irritable bowel syndrome)    Osteoarthritis    osteoarthritis   Pancreatitis    per patient    Recurrent boils    buttocks and low back.   S/P laparoscopic hysterectomy 11/04/2015   Shortness of breath    Sleep apnea    CPAP machine   Uncontrolled type 2 diabetes mellitus with diabetic polyneuropathy,  with long-term current use of insulin ([place]) 08/04/2017   Uterine cancer ([place]) 2017   Vitamin D deficiency     Patient Active Problem List   Diagnosis Date Noted   Cellulitis, face 05/03/2021   Medication monitoring encounter 01/25/2021   PICC (peripherally inserted central catheter) in place 01/25/2021   Chondritis of external ear, left 01/13/2021   Skin lesion of back 01/13/2021   Personal history of noncompliance with medical treatment, presenting hazards to health 11/16/2020   Skin lesion 11/11/2020   Left ear pain 10/28/2020   Long-term current use of opiate analgesic 04/08/2020   Assistance needed with transportation 03/28/2020   Lives in hotel 03/28/2020   Mixed hyperlipidemia 01/20/2020   Idiopathic peripheral neuropathy 12/11/2019   Pain in lower limb 12/11/2019   Restless legs 12/11/2019   Vitamin D deficiency 12/11/2019   Chronic low back pain 10/09/2019   Other spondylosis with radiculopathy, lumbar region 10/09/2019   Neck pain 05/22/2019   History of pulmonary embolism    Hypercalcemia 08/04/2017   Obesity, Class III, BMI 40-49.9 (morbid obesity) ([place]) 08/04/2017   Pulmonary embolism ([place]) 08/03/2017   Sleep apnea 08/03/2017   Diabetes mellitus ([place]) 10/25/2015   Chronic obstructive pulmonary disease ([place]) 05/25/2013   Tobacco abuse 05/25/2013   Essential hypertension, benign 05/25/2013   Thyromegaly 05/25/2013  Constipation 04/11/2011   GERD (gastroesophageal reflux disease) 04/11/2011    Past Surgical History:  Procedure Laterality Date   ABDOMINAL HYSTERECTOMY  2018   endometrial cancer, surgery done at [facility]  01/02/2017   Procedure: BIOPSY;  Surgeon: Danie Binder, MD;  Location[facility];  Service: Endoscopy;;  gastric biopsy   CESAREAN SECTION  1989   COLONOSCOPY WITH PROPOFOL N/A 01/02/2017   Dr. Oneida Alar: redundant colon, two 2-3 mm polyps in sigmoid colon (hyperplastic)   ESOPHAGOGASTRODUODENOSCOPY  05/22/2011   Dr. Oneida Alar:  H.pylori gastritis    ESOPHAGOGASTRODUODENOSCOPY (EGD) WITH PROPOFOL N/A 01/02/2017   Dr. 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 s/p dilation, moderate gastritis (negative H.pylori)   FOOT SURGERY Left    tendon repair   IR US GUIDE VASC ACCESS LEFT  01/18/2021   OOPHORECTOMY     POLYPECTOMY  01/02/2017   Procedure: POLYPECTOMY;  Surgeon: Danie Binder, MD;  Location: [facility];  Service: Endoscopy;;  sigmoid colon polyps times 2   REDUCTION MAMMAPLASTY  1996   SAVORY DILATION N/A 01/02/2017   Procedure: SAVORY DILATION;  Surgeon: Danie Binder, MD;  Location[facility];  Service: Endoscopy;  Laterality: N/A;   TUBAL LIGATION       OB History     Gravida  1   Para  1   Term  1   Preterm      AB      Living  1      SAB      IAB      Ectopic      Multiple      Live Births  1           Family History  Problem Relation Age of Onset   Diabetes Mother    Hypertension Mother    COPD Mother    Asthma Mother    Hypercholesterolemia Mother    Hypertension Sister    Hyperlipidemia Sister    Diabetes Sister    Asthma Brother    Alcohol abuse Brother    Asthma Son    Cancer Maternal Grandmother        lung, throat, breast   Alzheimer's disease Maternal Grandmother    Hypertension Maternal Grandmother    Hypercholesterolemia Maternal Grandmother    Heart disease Maternal Grandfather    Stroke Maternal Grandfather    Clotting disorder Maternal Grandfather        blood clots in legs   Hypertension Sister    Hyperlipidemia Sister    Stroke Maternal Aunt    Clotting disorder Maternal Aunt        blood clots in legs   Hypertension Maternal Aunt    Hypercholesterolemia Maternal Aunt    Hypertension Maternal Aunt    Asthma Maternal Aunt    Clotting disorder Maternal Uncle        blood clot -legs traveled to heart and he died of heart attack   Colon cancer Neg Hx     Social History   Tobacco Use   Smoking status: Some Days    Packs/day: 0.50     Years: 22.00    Pack years: 11.00    Types: Cigarettes   Smokeless tobacco: Never  Vaping Use   Vaping Use: Never used  Substance Use Topics   Alcohol use: No   Drug use: No    Home Medications Prior to Admission medications   Medication Sig Start Date End Date  Taking? Authorizing Provider  Accu-Chek Softclix Lancets lancets To test glucose 4 times a day 11/17/20   Cassandria Anger, MD  albuterol (PROVENTIL) (2.5 MG/3ML) 0.083% nebulizer solution Take 3 mLs (2.5 mg total) by nebulization every 6 (six) hours as needed for wheezing. 08/21/19   Corum, Rex Kras, MD  albuterol (VENTOLIN HFA) 108 (90 Base) MCG/ACT inhaler Inhale 2 puffs into the lungs every 6 (six) hours as needed for wheezing or shortness of breath. 08/21/19   Corum, Rex Kras, MD  amLODipine (NORVASC) 5 MG tablet Take 1 tablet (5 mg total) by mouth daily. 05/03/21   Fayrene Helper, MD  apixaban (ELIQUIS) 5 MG TABS tablet Take 1 tablet (5 mg total) by mouth 2 (two) times daily. On 08/12/17 @ 9PM, start 1 tab (5 mg) two times daily. Patient taking differently: Take 5 mg by mouth 2 (two) times daily. 10/28/20   Perlie Mayo, NP  Aspirin-Caffeine (BC FAST PAIN RELIEF PO) Take 1 packet by mouth daily as needed (pain).    [provider]  atorvastatin (LIPITOR) 80 MG tablet Take 1 tablet by mouth once daily Patient not taking: No sig reported 01/06/21   Lindell Spar, MD  Blood Glucose Monitoring Suppl (ACCU-CHEK GUIDE ME) w/Device KIT 1 Piece by Does not apply route as directed. 11/16/20   Cassandria Anger, MD  ciprofloxacin (CIPRO) 750 MG tablet Take 1 tablet (750 mg total) by mouth 2 (two) times daily. Patient not taking: No sig reported 05/24/21   Thayer Headings, MD  clotrimazole (GYNE-LOTRIMIN 3) 2 % vaginal cream Place 1 Applicatorful vaginally at bedtime. Patient taking differently: Place 1 Applicatorful vaginally at bedtime as needed (irritation). 05/03/21   Fayrene Helper, MD  cycloSPORINE  (RESTASIS) 0.05 % ophthalmic emulsion Place 1 drop into both eyes 2 (two) times daily.    [provider]  DAPTOmycin (CUBICIN) 500 MG injection Inject 16 mLs (800 mg total) into the vein daily. Patient not taking: No sig reported 04/28/21   Thayer Headings, MD  DULoxetine (CYMBALTA) 60 MG capsule Take 60 mg by mouth daily. 01/17/21   [provider]  esomeprazole (NEXIUM) 40 MG capsule Take 1 capsule (40 mg total) by mouth 2 (two) times daily before a meal. 05/27/20   Annitta Needs, NP  furosemide (LASIX) 40 MG tablet Take 1 tablet by mouth once daily 04/12/21   Lindell Spar, MD  glipiZIDE (GLUCOTROL XL) 5 MG 24 hr tablet Take 1 tablet by mouth once daily with breakfast 05/23/21   Noreene Larsson, NP  glucose blood (ACCU-CHEK GUIDE) test strip Test glucose 4 times a day 11/17/20   Cassandria Anger, MD  HYDROcodone-acetaminophen (NORCO) 7.5-325 MG tablet Take 1 tablet by mouth 3 (three) times daily as needed for moderate pain.    [provider]  HYDROcodone-acetaminophen (NORCO/VICODIN) 5-325 MG tablet Take 1 tablet by mouth every 4 (four) hours as needed for moderate pain. Patient not taking: No sig reported 03/26/21 03/26/22  Fransico Meadow, PA-C  hydrocortisone (ANUSOL-HC) 2.5 % rectal cream Place 1 application rectally 2 (two) times daily. As needed for itching Patient taking differently: Place 1 application rectally 2 (two) times daily as needed for anal itching. 05/27/20   Annitta Needs, NP  insulin NPH-regular Human (NOVOLIN 70/30 RELION) (70-30) 100 UNIT/ML injection Inject 60 Units into the skin 2 (two) times daily with a meal. Patient taking differently: Inject 100 Units into the skin 2 (two) times daily with  a meal. 11/16/20   Nida, Marella Chimes, MD  Insulin Pen Needle (B-D ULTRAFINE III SHORT PEN) 31G X 8 MM MISC Use to inject insulin 2 times daily. 11/17/20   Cassandria Anger, MD  linaclotide Rolan Lipa) 290 MCG CAPS capsule Take 1 capsule (290 mcg total) by  mouth daily before breakfast. 05/27/20   Annitta Needs, NP  lisinopril (ZESTRIL) 40 MG tablet Take 1 tablet by mouth once daily Patient not taking: No sig reported 04/13/21   Fayrene Helper, MD  metFORMIN (GLUCOPHAGE) 1000 MG tablet Take 1 tablet (1,000 mg total) by mouth 2 (two) times daily with a meal. 11/16/20   Nida, Marella Chimes, MD  methocarbamol (ROBAXIN) 500 MG tablet Take 1 tablet (500 mg total) by mouth 2 (two) times daily. Patient not taking: No sig reported 08/20/19   Kendrick, Caitlyn S, PA-C  montelukast (SINGULAIR) 10 MG tablet TAKE 1 TABLET BY MOUTH AT BEDTIME 03/03/21   Lindell Spar, MD  ofloxacin (FLOXIN) 0.3 % OTIC solution Place 5 drops into the left ear 2 (two) times daily. Patient not taking: No sig reported 06/08/20   Lindell Spar, MD  ondansetron (ZOFRAN-ODT) 4 MG disintegrating tablet Take 4 mg by mouth every 6 (six) hours as needed for nausea or vomiting. 10/11/15   [provider]  potassium chloride (KLOR-CON) 10 MEQ tablet TAKE 1 TABLET BY MOUTH ONCE DAILY IN THE EVENING 05/23/21   Lindell Spar, MD  rizatriptan (MAXALT) 10 MG tablet Take 10 mg by mouth daily as needed for migraine. 01/05/21   [provider]  silver sulfADIAZINE (SILVADENE) 1 % cream Apply 1 application topically daily. Patient not taking: Reported on 06/09/2021 07/03/18   Jonnie Kind, MD  topiramate (TOPAMAX) 25 MG tablet Take 50 mg by mouth daily. 01/17/21   [provider]  vancomycin (VANCOCIN) 10 G SOLR injection Inject 250 mLs into the vein 2 (two) times daily. 06/03/21   [provider]  Vitamin D, Ergocalciferol, (DRISDOL) 1.25 MG (50000 UNIT) CAPS capsule Take 1 capsule by mouth once a week 05/23/21   Noreene Larsson, NP    Allergies    Patient has no known allergies.  Review of Systems   Review of Systems  Skin:        PICC line problem  All other systems reviewed and are negative.  Physical Exam Updated Vital Signs BP (!) 164/98 (BP  Location: Right Arm)   Pulse 68   Temp 98.5 F (36.9 C) (Oral)   Resp 16   LMP 09/20/2015 (Exact Date)   SpO2 99%   Physical Exam Vitals and nursing note reviewed.  Constitutional:      General: She is not in acute distress.    Appearance: Normal appearance.  HENT:     Head: Normocephalic and atraumatic.  Eyes:     General:        Right eye: No discharge.        Left eye: No discharge.  Cardiovascular:     Rate and Rhythm: Normal rate and regular rhythm.     Heart sounds: No murmur heard.   No friction rub. No gallop.  Pulmonary:     Effort: Pulmonary effort is normal.     Breath sounds: Normal breath sounds.  Abdominal:     General: Bowel sounds are normal.     Palpations: Abdomen is soft.  Skin:    General: Skin is warm and dry.     Capillary Refill:  Capillary refill takes less than 2 seconds.     Comments: PICC Line in place on left upper arm. Redness, swelling, TTP without drainage of left arm.  Neurological:     Mental Status: She is alert and oriented to person, place, and time.  Psychiatric:        Mood and Affect: Mood normal.        Behavior: Behavior normal.    ED Results / Procedures / Treatments   Labs (all labs ordered are listed, but only abnormal results are displayed) Labs Reviewed  COMPREHENSIVE METABOLIC PANEL - Abnormal; Notable for the following components:      Result Value   Glucose, Bld 333 (*)    AST 13 (*)    All other components within normal limits  CBC WITH DIFFERENTIAL/PLATELET  PROTIME-INR    EKG None  Radiology DG Chest 2 View  Result Date: 06/09/2021 CLINICAL DATA:  Evaluate PICC line positioning EXAM: CHEST - 2 VIEW COMPARISON:  08/03/2017 FINDINGS: Interval placement of left arm PICC line with tip in the projection of the SVC. Heart size normal. No pleural effusion or edema. No airspace opacities identified. IMPRESSION: Satisfactory position of PICC line with tip in the SVC. Electronically Signed   By: Kerby Moors M.D.    On: 06/09/2021 17:16   UE Venous Duplex ([facility] and [facility] ONLY)  Result Date: 06/09/2021 UPPER VENOUS STUDY  Patient Name:  AKELA POCIUS  Date of Exam:   06/09/2021 Medical Rec #: [id number]      Accession #:    [id number] Date of Birth: Sep 25, 1965       Patient Gender: F Patient Age:   40 years Exam Location:  [facility] Procedure:      VAS [place] UPPER EXTREMITY VENOUS DUPLEX Referring Phys: Cinsere Mizrahi --------------------------------------------------------------------------------  Indications: PICC line, edema, pain Limitations: Bandages, body habitus and line. Comparison Study: No prior study on file Performing Technologist: Sharion Dove RVS  Examination Guidelines: A complete evaluation includes B-mode imaging, spectral Doppler, color Doppler, and power Doppler as needed of all accessible portions of each vessel. Bilateral testing is considered an integral part of a complete examination. Limited examinations for reoccurring indications may be performed as noted.  Right Findings: +----------+------------+---------+-----------+----------+-------+ RIGHT     CompressiblePhasicitySpontaneousPropertiesSummary +----------+------------+---------+-----------+----------+-------+ Subclavian               Yes       Yes                      +----------+------------+---------+-----------+----------+-------+  Left Findings: +----------+------------+---------+-----------+----------+-------+ LEFT      CompressiblePhasicitySpontaneousPropertiesSummary +----------+------------+---------+-----------+----------+-------+ IJV           Full       Yes       Yes                      +----------+------------+---------+-----------+----------+-------+ Subclavian               Yes       Yes                      +----------+------------+---------+-----------+----------+-------+ Axillary                 Yes       Yes                       +----------+------------+---------+-----------+----------+-------+ Brachial      Full  Yes       Yes                      +----------+------------+---------+-----------+----------+-------+ Cephalic      Full                                          +----------+------------+---------+-----------+----------+-------+ Basilic       Full                                          +----------+------------+---------+-----------+----------+-------+  Summary:  Right: No evidence of thrombosis in the subclavian.  Left: No evidence of deep vein thrombosis in the upper extremity. No evidence of superficial vein thrombosis in the upper extremity.  *See table(s) above for measurements and observations.  Diagnosing physician: Orlie Pollen Electronically signed by Orlie Pollen on 06/09/2021 at 3:07:25 PM.    Final     Procedures Procedures   Medications Ordered in ED Medications - No data to display  ED Course  I have reviewed the triage vital signs and the nursing notes.  Pertinent labs & imaging results that were available during my care of the patient were reviewed by me and considered in my medical decision making (see chart for details).    MDM Rules/Calculators/A&P                         I discussed this case with my attending physician who cosigned this note including patient's presenting symptoms, physical exam, and planned diagnostics and interventions. Attending physician stated agreement with plan or made changes to plan which were implemented.   Attending physician assessed patient at bedside.  Consulted with ID Dr. Linus Salmons regarding PICC line in place.  Dr. Linus Salmons has plans to continue IV antibiotics via PICC line until October 10.  However patient has plan for surgery on the left ear on October 5.  We will inject tPA into the line, consult IV team to ensure clearance.  Discussed whether to replace the line at this time, will hold off if able to clear the line  today.  Patient has no chest pain, no new murmur, no difficulty breathing.  Patient is currently receiving vancomycin, Cipro.  IV team was able to aspirate, and flush line.  Pending chest x-ray to check for kinks in the line. To follow-up with ID clinic as scheduled. Chest xray without abnormality, PICC line in good position. Patient stable for discharge. Patient informed. Return precautions given. Final Clinical Impression(s) / ED Diagnoses Final diagnoses:  Occlusion of peripherally inserted central catheter (PICC) line, initial encounter [facility])    Rx / [place] Orders ED Discharge Orders     None        Dorien Chihuahua 06/09/21 Cresenciano Lick, MD 06/09/21 2229

## 2021-06-10 ENCOUNTER — Telehealth: Payer: Self-pay | Admitting: *Deleted

## 2021-06-10 NOTE — Telephone Encounter (Signed)
Transition Care Management Unsuccessful Follow-up Telephone Call  Date of discharge and from where:  06/09/2021 [facility]  Attempts:  1st Attempt  Reason for unsuccessful TCM follow-up call:  Left voice message

## 2021-06-11 DIAGNOSIS — H61002 Unspecified perichondritis of left external ear: Secondary | ICD-10-CM | POA: Diagnosis not present

## 2021-06-13 DIAGNOSIS — H61019 Acute perichondritis of external ear, unspecified ear: Secondary | ICD-10-CM | POA: Diagnosis not present

## 2021-06-13 NOTE — Telephone Encounter (Signed)
Transition Care Management Unsuccessful Follow-up Telephone Call  Date of discharge and from where:  06/09/2021 from [facility] ED  Attempts:  2nd Attempt  Reason for unsuccessful TCM follow-up call:  Left voice message

## 2021-06-14 ENCOUNTER — Encounter (HOSPITAL_COMMUNITY): Payer: Self-pay | Admitting: Otolaryngology

## 2021-06-14 ENCOUNTER — Other Ambulatory Visit: Payer: Self-pay

## 2021-06-14 ENCOUNTER — Telehealth: Payer: Self-pay | Admitting: Pharmacist

## 2021-06-14 NOTE — Telephone Encounter (Signed)
Transition Care Management Follow-up Telephone Call Date of discharge and from where: 06/09/2021 from [facility] ED How have you been since you were released from the hospital? Pt stated that she was feeling better and did not have any questions or concerns at this time.  Any questions or concerns? No  Items Reviewed: Did the pt receive and understand the discharge instructions provided? Yes  Medications obtained and verified? Yes  Other? No  Any new allergies since your discharge? No  Dietary orders reviewed? No Do you have support at home? Yes   Functional Questionnaire: (I = Independent and D = Dependent) ADLs: I  Bathing/Dressing- I  Meal Prep- I  Eating- I  Maintaining continence- I  Transferring/Ambulation- I  Managing Meds- I   Follow up appointments reviewed:  PCP Hospital f/u appt confirmed? No   Specialist Hospital f/u appt confirmed? No   Are transportation arrangements needed? No  If their condition worsens, is the pt aware to call PCP or go to the Emergency Dept.? Yes Was the patient provided with contact information for the PCP's office or ED? Yes Was to pt encouraged to call back with questions or concerns? Yes

## 2021-06-14 NOTE — Telephone Encounter (Signed)
Sonya James from [facility] called stating patient did not have her vancomycin trough collected this week when she went the hospital for lab draws. There have been some communication issues with nursing that created her need to get her labs drawn at the hospital, but I gave a verbal order to get a vancomycin trough later this week.  Of note, her first trough was < 4 on 9/19, and we do not have another level to assess. Pharmacist says there may be some compliance issues with her as well. Sending as an FYI in case her levels are subtherapeutic. I see she may have outpatient surgery next week that may serve as source control for her infection. Will forward results to you to assess if she needs extension of IV antibiotics.  Thank you!  Alfonse Spruce, PharmD, CPP Clinical Pharmacist Practitioner Infectious [facility]

## 2021-06-14 NOTE — Progress Notes (Signed)
PCP - Dr. Posey Pronto Cardiologist - pt denies EKG - DOS Chest x-ray - 06/09/21 ECHO - 08/04/17 Cardiac Cath -  CPAP - yes - does not know settings  Fasting Blood Sugar:  pt does not check CBG often (less than once a month) Checks Blood Sugar:  /day  Blood Thinner Instructions: per pt last dose eliquis 06/13/21 Aspirin Instructions:   ERAS Protcol -   COVID TEST- n/a  Anesthesia review: n/a  -------------  SDW INSTRUCTIONS:  Your procedure is scheduled on Wednesday 10/5. Please report to [facility] Main Entrance "A" at [place].M., and check in at the Admitting office. Call this number if you have problems the morning of surgery: [phone]   Remember: Do not eat or drink after midnight the night before your surgery   Medications to take morning of surgery with a sip of water include: amLODipine (NORVASC)  DULoxetine (CYMBALTA)  esomeprazole (NEXIUM) vancomycin (VANCOCIN)  If needed: Inhalers --- Please bring all inhalers with you the day of surgery.  cycloSPORINE (RESTASIS) HYDROcodone-acetaminophen (NORCO/VICODIN)   ** PLEASE check your blood sugar the morning of your surgery when you wake up and every 2 hours until you get to the Short Stay unit.  If your blood sugar is less than 70 mg/dL, you will need to treat for low blood sugar: Do not take insulin. Treat a low blood sugar (less than 70 mg/dL) with  cup of clear juice (cranberry or apple), 4 glucose tablets, OR glucose gel. Recheck blood sugar in 15 minutes after treatment (to make sure it is greater than 70 mg/dL). If your blood sugar is not greater than 70 mg/dL on recheck, call [phone] for further instructions.  As of today, STOP taking any Aspirin (unless otherwise instructed by your surgeon), Aleve, Naproxen, Ibuprofen, Motrin, Advil, Goody's, BC's, all herbal medications, fish oil, and all vitamins.    The Morning of Surgery Do not wear jewelry, make-up or nail polish. Do not wear lotions, powders, or  perfumes, or deodorant Do not shave 48 hours prior to surgery.    Do not bring valuables to the hospital. [facility] is not responsible for any belongings or valuables.  If you are a smoker, DO NOT Smoke 24 hours prior to surgery  If you wear a CPAP at night please bring your mask the morning of surgery   Remember that you must have someone to transport you home after your surgery, and remain with you for 24 hours if you are discharged the same day.  Please bring cases for contacts, glasses, hearing aids, dentures or bridgework because it cannot be worn into surgery.   Patients discharged the day of surgery will not be allowed to drive home.   Please shower the NIGHT BEFORE/MORNING OF SURGERY (use antibacterial soap like DIAL soap if possible). Wear comfortable clothes the morning of surgery. Oral Hygiene is also important to reduce your risk of infection.  Remember - BRUSH YOUR TEETH THE MORNING OF SURGERY WITH YOUR REGULAR TOOTHPASTE  Patient denies shortness of breath, fever, cough and chest pain.

## 2021-06-15 ENCOUNTER — Encounter (HOSPITAL_COMMUNITY): Payer: Self-pay | Admitting: Otolaryngology

## 2021-06-15 ENCOUNTER — Encounter (HOSPITAL_COMMUNITY)
Admission: RE | Disposition: A | Discharge status: Home / Self Care | Payer: Self-pay | Source: Ambulatory Visit | Attending: Otolaryngology

## 2021-06-15 ENCOUNTER — Ambulatory Visit (HOSPITAL_COMMUNITY): Payer: Medicaid Other | Admitting: Certified Registered"

## 2021-06-15 ENCOUNTER — Other Ambulatory Visit: Payer: Self-pay

## 2021-06-15 ENCOUNTER — Other Ambulatory Visit (HOSPITAL_COMMUNITY)
Admission: RE | Admit: 2021-06-15 | Discharge: 2021-06-15 | Disposition: A | Discharge status: Home / Self Care | Payer: Medicaid Other | Attending: Internal Medicine | Admitting: Internal Medicine

## 2021-06-15 ENCOUNTER — Ambulatory Visit (HOSPITAL_COMMUNITY)
Admission: RE | Admit: 2021-06-15 | Discharge: 2021-06-15 | Disposition: A | Discharge status: Home / Self Care | Payer: Medicaid Other | Source: Ambulatory Visit | Attending: Otolaryngology | Admitting: Otolaryngology

## 2021-06-15 ENCOUNTER — Other Ambulatory Visit (HOSPITAL_COMMUNITY): Payer: Self-pay

## 2021-06-15 DIAGNOSIS — Z801 Family history of malignant neoplasm of trachea, bronchus and lung: Secondary | ICD-10-CM | POA: Diagnosis not present

## 2021-06-15 DIAGNOSIS — G4730 Sleep apnea, unspecified: Secondary | ICD-10-CM | POA: Diagnosis not present

## 2021-06-15 DIAGNOSIS — Z7984 Long term (current) use of oral hypoglycemic drugs: Secondary | ICD-10-CM | POA: Diagnosis not present

## 2021-06-15 DIAGNOSIS — Z832 Family history of diseases of the blood and blood-forming organs and certain disorders involving the immune mechanism: Secondary | ICD-10-CM | POA: Insufficient documentation

## 2021-06-15 DIAGNOSIS — E119 Type 2 diabetes mellitus without complications: Secondary | ICD-10-CM | POA: Insufficient documentation

## 2021-06-15 DIAGNOSIS — H61032 Chondritis of left external ear: Secondary | ICD-10-CM | POA: Diagnosis not present

## 2021-06-15 DIAGNOSIS — Z9989 Dependence on other enabling machines and devices: Secondary | ICD-10-CM | POA: Diagnosis not present

## 2021-06-15 DIAGNOSIS — Z803 Family history of malignant neoplasm of breast: Secondary | ICD-10-CM | POA: Insufficient documentation

## 2021-06-15 DIAGNOSIS — H61002 Unspecified perichondritis of left external ear: Secondary | ICD-10-CM | POA: Diagnosis not present

## 2021-06-15 DIAGNOSIS — H61192 Noninfective disorders of pinna, left ear: Secondary | ICD-10-CM | POA: Insufficient documentation

## 2021-06-15 DIAGNOSIS — Z794 Long term (current) use of insulin: Secondary | ICD-10-CM | POA: Insufficient documentation

## 2021-06-15 DIAGNOSIS — Z833 Family history of diabetes mellitus: Secondary | ICD-10-CM | POA: Diagnosis not present

## 2021-06-15 DIAGNOSIS — F17210 Nicotine dependence, cigarettes, uncomplicated: Secondary | ICD-10-CM | POA: Insufficient documentation

## 2021-06-15 DIAGNOSIS — Z825 Family history of asthma and other chronic lower respiratory diseases: Secondary | ICD-10-CM | POA: Insufficient documentation

## 2021-06-15 DIAGNOSIS — L988 Other specified disorders of the skin and subcutaneous tissue: Secondary | ICD-10-CM | POA: Diagnosis present

## 2021-06-15 DIAGNOSIS — H60502 Unspecified acute noninfective otitis externa, left ear: Secondary | ICD-10-CM | POA: Diagnosis not present

## 2021-06-15 DIAGNOSIS — Z8249 Family history of ischemic heart disease and other diseases of the circulatory system: Secondary | ICD-10-CM | POA: Insufficient documentation

## 2021-06-15 DIAGNOSIS — Z7901 Long term (current) use of anticoagulants: Secondary | ICD-10-CM | POA: Diagnosis not present

## 2021-06-15 DIAGNOSIS — Z79899 Other long term (current) drug therapy: Secondary | ICD-10-CM | POA: Diagnosis not present

## 2021-06-15 DIAGNOSIS — J449 Chronic obstructive pulmonary disease, unspecified: Secondary | ICD-10-CM | POA: Diagnosis not present

## 2021-06-15 DIAGNOSIS — H61892 Other specified disorders of left external ear: Secondary | ICD-10-CM | POA: Diagnosis not present

## 2021-06-15 DIAGNOSIS — Z800 Family history of malignant neoplasm of digestive organs: Secondary | ICD-10-CM | POA: Diagnosis not present

## 2021-06-15 HISTORY — PX: OTOPLASATY: SHX1485

## 2021-06-15 LAB — BASIC METABOLIC PANEL
Anion gap: 5 (ref 5–15)
BUN: 11 mg/dL (ref 6–20)
CO2: 27 mmol/L (ref 22–32)
Calcium: 9.7 mg/dL (ref 8.9–10.3)
Chloride: 104 mmol/L (ref 98–111)
Creatinine, Ser: 0.86 mg/dL (ref 0.44–1.00)
GFR, Estimated: >60 mL/min (ref >60)
Glucose, Bld: 230 mg/dL — ABNORMAL HIGH (ref 70–99)
Potassium: 3.4 mmol/L — ABNORMAL LOW (ref 3.5–5.1)
Sodium: 136 mmol/L (ref 135–145)

## 2021-06-15 LAB — VANCOMYCIN, TROUGH: Vancomycin Tr: 4 ug/mL — ABNORMAL LOW (ref 15–20)

## 2021-06-15 LAB — C-REACTIVE PROTEIN: CRP: 2 mg/dL — ABNORMAL HIGH (ref <1.0)

## 2021-06-15 LAB — GLUCOSE, CAPILLARY
Glucose-Capillary: 307 mg/dL — ABNORMAL HIGH (ref 70–99)
Glucose-Capillary: 245 mg/dL — ABNORMAL HIGH (ref 70–99)
Glucose-Capillary: 164 mg/dL — ABNORMAL HIGH (ref 70–99)
Glucose-Capillary: 89 mg/dL (ref 70–99)

## 2021-06-15 LAB — SEDIMENTATION RATE: Sed Rate: 50 mm/hr — ABNORMAL HIGH (ref 0–22)

## 2021-06-15 SURGERY — RECONSTRUCTION, EAR
Anesthesia: General | Site: Ear | Laterality: Left

## 2021-06-15 MED ORDER — BACITRACIN ZINC 500 UNIT/GM EX OINT
TOPICAL_OINTMENT | CUTANEOUS | Status: DC | PRN
  Administered 2021-06-15: 1 via TOPICAL

## 2021-06-15 MED ORDER — ALTEPLASE 2 MG IJ SOLR
2.0000 mg | Freq: Once | INTRAMUSCULAR | Status: AC
  Administered 2021-06-15: 2 mg
  Filled 2021-06-15: qty 2

## 2021-06-15 MED ORDER — AMISULPRIDE (ANTIEMETIC) 5 MG/2ML IV SOLN: 10.0000 mg | Freq: Once | INTRAVENOUS | Status: DC | PRN

## 2021-06-15 MED ORDER — CHLORHEXIDINE GLUCONATE CLOTH 2 % EX PADS: 6.0000 | MEDICATED_PAD | Freq: Once | CUTANEOUS | Status: DC

## 2021-06-15 MED ORDER — INSULIN ASPART 100 UNIT/ML IJ SOLN
10.0000 [IU] | Freq: Once | INTRAMUSCULAR | Status: AC
  Administered 2021-06-15: 10 [IU] via SUBCUTANEOUS

## 2021-06-15 MED ORDER — HYDROCODONE-ACETAMINOPHEN 7.5-325 MG PO TABS
1.0000 | ORAL_TABLET | Freq: Four times a day (QID) | ORAL | 0 refills | Status: AC | PRN
  Filled 2021-06-15: qty 28, 7d supply, fill #0

## 2021-06-15 MED ORDER — FENTANYL CITRATE (PF) 250 MCG/5ML IJ SOLN
INTRAMUSCULAR | Status: AC
  Filled 2021-06-15: qty 5

## 2021-06-15 MED ORDER — PROPOFOL 10 MG/ML IV BOLUS
INTRAVENOUS | Status: AC
  Filled 2021-06-15: qty 20

## 2021-06-15 MED ORDER — MIDAZOLAM HCL 2 MG/2ML IJ SOLN
INTRAMUSCULAR | Status: AC
  Filled 2021-06-15: qty 2

## 2021-06-15 MED ORDER — DOCUSATE SODIUM 100 MG PO CAPS
100.0000 mg | ORAL_CAPSULE | Freq: Two times a day (BID) | ORAL | 0 refills | Status: AC | PRN
  Filled 2021-06-15: qty 20, 10d supply, fill #0

## 2021-06-15 MED ORDER — FENTANYL CITRATE (PF) 100 MCG/2ML IJ SOLN: 25.0000 ug | INTRAMUSCULAR | Status: DC | PRN

## 2021-06-15 MED ORDER — PROMETHAZINE HCL 25 MG/ML IJ SOLN: 6.2500 mg | INTRAMUSCULAR | Status: DC | PRN

## 2021-06-15 MED ORDER — PROPOFOL 10 MG/ML IV BOLUS
INTRAVENOUS | Status: DC | PRN
  Administered 2021-06-15: 170 mg via INTRAVENOUS

## 2021-06-15 MED ORDER — LIDOCAINE-EPINEPHRINE 1 %-1:100000 IJ SOLN
INTRAMUSCULAR | Status: AC
  Filled 2021-06-15: qty 1

## 2021-06-15 MED ORDER — FENTANYL CITRATE (PF) 250 MCG/5ML IJ SOLN
INTRAMUSCULAR | Status: DC | PRN
  Administered 2021-06-15: 100 ug via INTRAVENOUS

## 2021-06-15 MED ORDER — MIDAZOLAM HCL 5 MG/5ML IJ SOLN
INTRAMUSCULAR | Status: DC | PRN
  Administered 2021-06-15: 2 mg via INTRAVENOUS

## 2021-06-15 MED ORDER — INSULIN ASPART 100 UNIT/ML IJ SOLN: 5.0000 [IU] | Freq: Once | INTRAMUSCULAR | Status: AC

## 2021-06-15 MED ORDER — BACITRACIN ZINC 500 UNIT/GM EX OINT
TOPICAL_OINTMENT | CUTANEOUS | Status: AC
  Filled 2021-06-15: qty 28.35

## 2021-06-15 MED ORDER — LACTATED RINGERS IV SOLN: INTRAVENOUS | Status: DC

## 2021-06-15 MED ORDER — ONDANSETRON HCL 4 MG/2ML IJ SOLN
INTRAMUSCULAR | Status: DC | PRN
Start: 2021-06-15 — End: 2021-06-15
  Administered 2021-06-15: 4 mg via INTRAVENOUS

## 2021-06-15 MED ORDER — INSULIN ASPART 100 UNIT/ML IJ SOLN
INTRAMUSCULAR | Status: AC
  Filled 2021-06-15: qty 1

## 2021-06-15 MED ORDER — ORAL CARE MOUTH RINSE: 15.0000 mL | Freq: Once | OROMUCOSAL | Status: AC

## 2021-06-15 MED ORDER — LIDOCAINE-EPINEPHRINE 1 %-1:100000 IJ SOLN
INTRAMUSCULAR | Status: DC | PRN
  Administered 2021-06-15: 3 mL

## 2021-06-15 MED ORDER — SCOPOLAMINE 1 MG/3DAYS TD PT72
MEDICATED_PATCH | TRANSDERMAL | Status: AC
  Administered 2021-06-15: 1.5 mg via TRANSDERMAL
  Filled 2021-06-15: qty 1

## 2021-06-15 MED ORDER — PHENYLEPHRINE HCL-NACL 20-0.9 MG/250ML-% IV SOLN
INTRAVENOUS | Status: DC | PRN
Start: 2021-06-15 — End: 2021-06-15
  Administered 2021-06-15: 30 ug/min via INTRAVENOUS

## 2021-06-15 MED ORDER — EPHEDRINE SULFATE-NACL 50-0.9 MG/10ML-% IV SOSY
PREFILLED_SYRINGE | INTRAVENOUS | Status: DC | PRN
  Administered 2021-06-15: 10 mg via INTRAVENOUS
  Administered 2021-06-15: 5 mg via INTRAVENOUS
  Administered 2021-06-15: 10 mg via INTRAVENOUS

## 2021-06-15 MED ORDER — SCOPOLAMINE 1 MG/3DAYS TD PT72: 1.0000 | MEDICATED_PATCH | TRANSDERMAL | Status: DC

## 2021-06-15 MED ORDER — CELECOXIB 200 MG PO CAPS
200.0000 mg | ORAL_CAPSULE | Freq: Once | ORAL | Status: AC
  Administered 2021-06-15: 200 mg via ORAL
  Filled 2021-06-15: qty 1

## 2021-06-15 MED ORDER — CHLORHEXIDINE GLUCONATE 0.12 % MT SOLN
15.0000 mL | Freq: Once | OROMUCOSAL | Status: AC
  Administered 2021-06-15: 15 mL via OROMUCOSAL
  Filled 2021-06-15: qty 15

## 2021-06-15 MED ORDER — HEPARIN SOD (PORK) LOCK FLUSH 100 UNIT/ML IV SOLN
250.0000 [IU] | INTRAVENOUS | Status: AC | PRN
  Administered 2021-06-15: 250 [IU]
  Filled 2021-06-15: qty 2.5

## 2021-06-15 MED ORDER — ACETAMINOPHEN 500 MG PO TABS
1000.0000 mg | ORAL_TABLET | Freq: Once | ORAL | Status: AC
  Administered 2021-06-15: 1000 mg via ORAL
  Filled 2021-06-15: qty 2

## 2021-06-15 MED ORDER — INSULIN ASPART 100 UNIT/ML IJ SOLN
INTRAMUSCULAR | Status: AC
  Administered 2021-06-15: 5 [IU] via SUBCUTANEOUS
  Filled 2021-06-15: qty 1

## 2021-06-15 MED ORDER — SUCCINYLCHOLINE CHLORIDE 200 MG/10ML IV SOSY
PREFILLED_SYRINGE | INTRAVENOUS | Status: DC | PRN
  Administered 2021-06-15: 160 mg via INTRAVENOUS

## 2021-06-15 SURGICAL SUPPLY — 58 items
APL SWBSTK 6 STRL LF DISP (MISCELLANEOUS) ×1
APPLICATOR COTTON TIP 6 STRL (MISCELLANEOUS) IMPLANT
APPLICATOR COTTON TIP 6IN STRL (MISCELLANEOUS) ×2
ATTRACTOMAT 16X20 MAGNETIC DRP (DRAPES) IMPLANT
BAG COUNTER SPONGE SURGICOUNT (BAG) ×2 IMPLANT
BAG SPNG CNTER NS LX DISP (BAG) ×1
BLADE CLIPPER SURG (BLADE) IMPLANT
BLADE SURG 15 STRL LF DISP TIS (BLADE) IMPLANT
BLADE SURG 15 STRL SS (BLADE) ×2
CLEANER TIP ELECTROSURG 2X2 (MISCELLANEOUS) ×1 IMPLANT
CNTNR URN SCR LID CUP LEK RST (MISCELLANEOUS) ×1 IMPLANT
CONT SPEC 4OZ STRL OR WHT (MISCELLANEOUS)
CORD BIPOLAR FORCEPS 12FT (ELECTRODE) ×1 IMPLANT
COVER SURGICAL LIGHT HANDLE (MISCELLANEOUS) ×2 IMPLANT
DRAPE HALF SHEET 40X57 (DRAPES) IMPLANT
DRAPE SURG 17X23 STRL (DRAPES) ×1 IMPLANT
DRSG GLASSCOCK MASTOID ADT (GAUZE/BANDAGES/DRESSINGS) ×1 IMPLANT
DRSG XEROFORM 1X8 (GAUZE/BANDAGES/DRESSINGS) ×1 IMPLANT
ELECT COATED BLADE 2.86 ST (ELECTRODE) ×2 IMPLANT
ELECT REM PT RETURN 9FT ADLT (ELECTROSURGICAL) ×2
ELECTRODE REM PT RTRN 9FT ADLT (ELECTROSURGICAL) ×1 IMPLANT
FORCEPS BIPOLAR SPETZLER 8 1.0 (NEUROSURGERY SUPPLIES) ×1 IMPLANT
GAUZE 4X4 16PLY ~~LOC~~+RFID DBL (SPONGE) ×1 IMPLANT
GAUZE SPONGE 4X4 12PLY STRL (GAUZE/BANDAGES/DRESSINGS) IMPLANT
GLOVE SURG ENC MOIS LTX SZ6 (GLOVE) ×1 IMPLANT
GOWN STRL REUS W/ TWL LRG LVL3 (GOWN DISPOSABLE) ×2 IMPLANT
GOWN STRL REUS W/TWL LRG LVL3 (GOWN DISPOSABLE) ×10
HOLDER TRACH TUBE VELCRO 19.5 (MISCELLANEOUS) IMPLANT
KIT BASIN OR (CUSTOM PROCEDURE TRAY) ×2 IMPLANT
KIT TURNOVER KIT B (KITS) ×2 IMPLANT
LOCATOR NERVE 3 VOLT (DISPOSABLE) IMPLANT
NDL HYPO 25GX1X1/2 BEV (NEEDLE) IMPLANT
NEEDLE HYPO 25GX1X1/2 BEV (NEEDLE) ×2 IMPLANT
NS IRRIG 1000ML POUR BTL (IV SOLUTION) ×2 IMPLANT
PAD ARMBOARD 7.5X6 YLW CONV (MISCELLANEOUS) ×4 IMPLANT
PENCIL SMOKE EVACUATOR (MISCELLANEOUS) ×2 IMPLANT
POSITIONER HEAD DONUT 9IN (MISCELLANEOUS) IMPLANT
SPECIMEN JAR MEDIUM (MISCELLANEOUS) ×1 IMPLANT
SPONGE INTESTINAL PEANUT (DISPOSABLE) IMPLANT
SPONGE T-LAP 18X18 ~~LOC~~+RFID (SPONGE) ×1 IMPLANT
STAPLER VISISTAT 35W (STAPLE) ×1 IMPLANT
SUT CHROMIC 3 0 PS 2 (SUTURE) IMPLANT
SUT ETHILON 5 0 PS 2 18 (SUTURE) IMPLANT
SUT ETHILON 6 0 9-3 1X18 BLK (SUTURE) ×1 IMPLANT
SUT SILK 2 0 (SUTURE)
SUT SILK 2-0 18XBRD TIE 12 (SUTURE) IMPLANT
SUT SILK 3 0 (SUTURE)
SUT SILK 3 0 SH CR/8 (SUTURE) ×2 IMPLANT
SUT SILK 3-0 18XBRD TIE 12 (SUTURE) ×1 IMPLANT
SUT SILK 4 0 (SUTURE)
SUT SILK 4-0 18XBRD TIE 12 (SUTURE) ×1 IMPLANT
SUT VICRYL RAPID 5 0 P 3 (SUTURE) ×2 IMPLANT
SWAB COLLECTION DEVICE MRSA (MISCELLANEOUS) ×1 IMPLANT
SWAB CULTURE ESWAB REG 1ML (MISCELLANEOUS) ×1 IMPLANT
TOWEL GREEN STERILE FF (TOWEL DISPOSABLE) ×3 IMPLANT
TRAY ENT MC OR (CUSTOM PROCEDURE TRAY) ×2 IMPLANT
UNDERPAD 30X36 HEAVY ABSORB (UNDERPADS AND DIAPERS) IMPLANT
WATER STERILE IRR 1000ML POUR (IV SOLUTION) ×1 IMPLANT

## 2021-06-15 NOTE — Progress Notes (Signed)
Patient states that transportation is not picking her up until 0930.  OR desk and Dr. Fredric Dine aware.

## 2021-06-15 NOTE — Progress Notes (Signed)
PICC line declotting process by IV nurse. Patient is recovered.

## 2021-06-15 NOTE — Discharge Instructions (Signed)
POSTOP INSTRUCTIONS  Keep white external mastoid dressing over ear for 24 hours. After 24 hours, can remove dressing, but replace when sleeping. After removing the dressing, you will see a yellow gauze in your ear canal. DO NOT REMOVE OR TOUCH THIS.  KEEP EAR DRY. Do not allow any water into or on your ear at all until cleared by your physician. You can place a cotton ball with Vaseline ointment over your ear when bathing.   Take pain medicine as prescribed. If pain is not severe, you can take Tylenol. Avoid NSAIDs (Motrin, Aleve, Ibuprofen) for 72 hours after surgery.   Call office with any concerns.    [place] [phone]

## 2021-06-15 NOTE — H&P (Signed)
Sonya James is an 55 y.o. female.    Chief Complaint:  Left ear chondritis, left ear lesion  HPI: Patient presents today for planned elective procedure.  She continues on IV antibiotics via PICC. Reports ear pain is stable, denies any recent drainage. Last office visit on 04/05/2021:  Sonya James is a 55 y.o. female who presents as a return patient for chondritis of left external ear. Patient was recently seen at [facility], ED for severe left-sided ear pain and drainage on 03/26/2021. She has been following with infectious disease, Dr. Luciana Axe for her chondritis, and had been treated with a 3-week course of cefepime. She states that approximately 1 week after her PICC was removed, she began experiencing increased swelling in the left ear and drainage. While in the ED, CT of the neck with contrast was performed, which demonstrated diffuse tissue swelling of the left external ear and a 5 mm non-enhancing hypodense lesion of the conchal bowl. Patient states that her blood sugars have been better controlled recently; last A1c in February was 12.2. She continues to smoke on a daily basis.  Past Medical History:  Diagnosis Date   Anemia    Anxiety    Arthritis    Asthma    Asthma 05/25/2013   Autonomic neuropathy    Cancer of endometrium (HCC) 10/18/2015   Constipation    COPD (chronic obstructive pulmonary disease) (HCC)    CTS (carpal tunnel syndrome)    Depression    Diabetes mellitus without complication (HCC)    Dysphagia 12/14/2016   Encounter for screening colonoscopy    Endometrial cancer (HCC) 2017   Gastritis without bleeding    GERD (gastroesophageal reflux disease)    Gross hematuria 05/11/2019   Headache(784.0)    Heavy menses 10/11/2015   HTN (hypertension)    Hypercholesterolemia    Hypothyroidism    IBS (irritable bowel syndrome)    Osteoarthritis    osteoarthritis   Pancreatitis    per patient    Recurrent boils    buttocks and low back.   S/P laparoscopic  hysterectomy 11/04/2015   Shortness of breath    Sleep apnea    CPAP machine   Uncontrolled type 2 diabetes mellitus with diabetic polyneuropathy, with long-term current use of insulin 08/04/2017   Uterine cancer (HCC) 2017   Vitamin D deficiency     Past Surgical History:  Procedure Laterality Date   ABDOMINAL HYSTERECTOMY  2018   endometrial cancer, surgery done at [facility]    BIOPSY  01/02/2017   Procedure: BIOPSY;  Surgeon: West Bali, MD;  Location: [facility];  Service: Endoscopy;;  gastric biopsy   CESAREAN SECTION  1989   COLONOSCOPY WITH PROPOFOL N/A 01/02/2017   Dr. Darrick Penna: redundant colon, two 2-3 mm polyps in sigmoid colon (hyperplastic)   ESOPHAGOGASTRODUODENOSCOPY  05/22/2011   Dr. Darrick Penna: H.pylori gastritis    ESOPHAGOGASTRODUODENOSCOPY (EGD) WITH PROPOFOL N/A 01/02/2017   Dr. Darrick Penna: possible web in proximal esophagus s/p dilation, moderate gastritis (negative H.pylori)   FOOT SURGERY Left    tendon repair   IR US GUIDE VASC ACCESS LEFT  01/18/2021   OOPHORECTOMY     POLYPECTOMY  01/02/2017   Procedure: POLYPECTOMY;  Surgeon: West Bali, MD;  Location: [facility];  Service: Endoscopy;;  sigmoid colon polyps times 2   REDUCTION MAMMAPLASTY  1996   SAVORY DILATION N/A 01/02/2017   Procedure: SAVORY DILATION;  Surgeon: West Bali, MD;  Location: [facility];  Service: Endoscopy;  Laterality: N/A;   TUBAL LIGATION      Family History  Problem Relation Age of Onset   Diabetes Mother    Hypertension Mother    COPD Mother    Asthma Mother    Hypercholesterolemia Mother    Hypertension Sister    Hyperlipidemia Sister    Diabetes Sister    Asthma Brother    Alcohol abuse Brother    Asthma Son    Cancer Maternal Grandmother        lung, throat, breast   Alzheimer's disease Maternal Grandmother    Hypertension Maternal Grandmother    Hypercholesterolemia Maternal Grandmother    Heart disease Maternal Grandfather    Stroke Maternal Grandfather     Clotting disorder Maternal Grandfather        blood clots in legs   Hypertension Sister    Hyperlipidemia Sister    Stroke Maternal Aunt    Clotting disorder Maternal Aunt        blood clots in legs   Hypertension Maternal Aunt    Hypercholesterolemia Maternal Aunt    Hypertension Maternal Aunt    Asthma Maternal Aunt    Clotting disorder Maternal Uncle        blood clot -legs traveled to heart and he died of heart attack   Colon cancer Neg Hx     Social History:  reports that she has been smoking cigarettes. She has a 22.00 pack-year smoking history. She has never used smokeless tobacco. She reports that she does not drink alcohol and does not use drugs.  Allergies: No Known Allergies  Medications Prior to Admission  Medication Sig Dispense Refill   albuterol (VENTOLIN HFA) 108 (90 Base) MCG/ACT inhaler Inhale 2 puffs into the lungs every 6 (six) hours as needed for wheezing or shortness of breath. 18 g 5   amLODipine (NORVASC) 5 MG tablet Take 1 tablet (5 mg total) by mouth daily. 30 tablet 3   apixaban (ELIQUIS) 5 MG TABS tablet Take 1 tablet (5 mg total) by mouth 2 (two) times daily. On 08/12/17 @ 9PM, start 1 tab (5 mg) two times daily. (Patient taking differently: Take 5 mg by mouth 2 (two) times daily.) 60 tablet 5   Aspirin-Caffeine (BC FAST PAIN RELIEF PO) Take 1 packet by mouth daily as needed (pain).     cycloSPORINE (RESTASIS) 0.05 % ophthalmic emulsion Place 1 drop into both eyes 2 (two) times daily.     DULoxetine (CYMBALTA) 60 MG capsule Take 60 mg by mouth daily.     esomeprazole (NEXIUM) 40 MG capsule Take 1 capsule (40 mg total) by mouth 2 (two) times daily before a meal. 180 capsule 3   furosemide (LASIX) 40 MG tablet Take 1 tablet by mouth once daily 90 tablet 0   glipiZIDE (GLUCOTROL XL) 5 MG 24 hr tablet Take 1 tablet by mouth once daily with breakfast 30 tablet 0   HYDROcodone-acetaminophen (NORCO) 7.5-325 MG tablet Take 1 tablet by mouth 3 (three) times daily  as needed for moderate pain.     insulin NPH-regular Human (NOVOLIN 70/30 RELION) (70-30) 100 UNIT/ML injection Inject 60 Units into the skin 2 (two) times daily with a meal. (Patient taking differently: Inject 100 Units into the skin 2 (two) times daily with a meal.) 30 mL 2   linaclotide (LINZESS) 290 MCG CAPS capsule Take 1 capsule (290 mcg total) by mouth daily before breakfast. 90 capsule 3   metFORMIN (GLUCOPHAGE) 1000 MG tablet Take 1  tablet (1,000 mg total) by mouth 2 (two) times daily with a meal. 60 tablet 2   montelukast (SINGULAIR) 10 MG tablet TAKE 1 TABLET BY MOUTH AT BEDTIME 30 tablet 5   potassium chloride (KLOR-CON) 10 MEQ tablet TAKE 1 TABLET BY MOUTH ONCE DAILY IN THE EVENING 90 tablet 0   rizatriptan (MAXALT) 10 MG tablet Take 10 mg by mouth daily as needed for migraine.     topiramate (TOPAMAX) 25 MG tablet Take 50 mg by mouth daily.     vancomycin (VANCOCIN) 10 G SOLR injection Inject 250 mLs into the vein 2 (two) times daily.     Vitamin D, Ergocalciferol, (DRISDOL) 1.25 MG (50000 UNIT) CAPS capsule Take 1 capsule by mouth once a week 8 capsule 0   Accu-Chek Softclix Lancets lancets To test glucose 4 times a day 150 each 2   albuterol (PROVENTIL) (2.5 MG/3ML) 0.083% nebulizer solution Take 3 mLs (2.5 mg total) by nebulization every 6 (six) hours as needed for wheezing. 75 mL 12   atorvastatin (LIPITOR) 80 MG tablet Take 1 tablet by mouth once daily (Patient not taking: No sig reported) 90 tablet 0   Blood Glucose Monitoring Suppl (ACCU-CHEK GUIDE ME) w/Device KIT 1 Piece by Does not apply route as directed. 1 kit 0   ciprofloxacin (CIPRO) 750 MG tablet Take 1 tablet (750 mg total) by mouth 2 (two) times daily. (Patient not taking: No sig reported) 42 tablet 0   clotrimazole (GYNE-LOTRIMIN 3) 2 % vaginal cream Place 1 Applicatorful vaginally at bedtime. (Patient taking differently: Place 1 Applicatorful vaginally at bedtime as needed (irritation).) 21 g 0   DAPTOmycin (CUBICIN)  500 MG injection Inject 16 mLs (800 mg total) into the vein daily. (Patient not taking: No sig reported) 1 each 1   glucose blood (ACCU-CHEK GUIDE) test strip Test glucose 4 times a day 150 each 2   HYDROcodone-acetaminophen (NORCO/VICODIN) 5-325 MG tablet Take 1 tablet by mouth every 4 (four) hours as needed for moderate pain. (Patient not taking: No sig reported) 20 tablet 0   hydrocortisone (ANUSOL-HC) 2.5 % rectal cream Place 1 application rectally 2 (two) times daily. As needed for itching (Patient taking differently: Place 1 application rectally 2 (two) times daily as needed for anal itching.) 30 g 1   Insulin Pen Needle (B-D ULTRAFINE III SHORT PEN) 31G X 8 MM MISC Use to inject insulin 2 times daily. 100 each 2   lisinopril (ZESTRIL) 40 MG tablet Take 1 tablet by mouth once daily (Patient not taking: No sig reported) 30 tablet 0   methocarbamol (ROBAXIN) 500 MG tablet Take 1 tablet (500 mg total) by mouth 2 (two) times daily. (Patient not taking: No sig reported) 20 tablet 0   ofloxacin (FLOXIN) 0.3 % OTIC solution Place 5 drops into the left ear 2 (two) times daily. (Patient not taking: No sig reported) 5 mL 0   ondansetron (ZOFRAN-ODT) 4 MG disintegrating tablet Take 4 mg by mouth every 6 (six) hours as needed for nausea or vomiting.     silver sulfADIAZINE (SILVADENE) 1 % cream Apply 1 application topically daily. (Patient not taking: Reported on 06/09/2021) 50 g 0    Results for orders placed or performed during the hospital encounter of 06/15/21 (from the past 48 hour(s))  Glucose, capillary     Status: Abnormal   Collection Time: 06/15/21 10:36 AM  Result Value Ref Range   Glucose-Capillary 307 (H) 70 - 99 mg/dL    Comment: Glucose reference range  applies only to samples taken after fasting for at least 8 hours.  Glucose, capillary     Status: Abnormal   Collection Time: 06/15/21 11:39 AM  Result Value Ref Range   Glucose-Capillary 245 (H) 70 - 99 mg/dL    Comment: Glucose  reference range applies only to samples taken after fasting for at least 8 hours.   No results found.  ROS: ROS  Blood pressure 123/69, pulse 62, temperature 98 F (36.7 C), temperature source Oral, resp. rate 17, height $RemoveBe'5\' 1"'iiOjqkdtI$  (1.549 m), weight 104.3 kg, last menstrual period 09/20/2015, SpO2 98 %.  PHYSICAL EXAM: General: Well developed, well nourished.  Head/Face: Normocephalic, atraumatic. No scars or lesions. No sinus tenderness. Facial nerve intact and equal bilaterally. No facial lacerations.  Eyes: Pupils are equal, round and reactive to light. Conjunctiva and lids are normal. Normal extraocular mobility.  Ears: Right: Pinna and external meatus normal Left: Diffuse edema of the left pinna, with induration noted between helix and antihelical fold, very tender to palpation. 5 mm mobile cystic lesion in the conchal bowl, consistent with recurrent epidermoid cyst.  Nose: No gross deformity or lesions. No purulent discharge.  Neck: Trachea midline.  Respiratory: No stridor or distress.  Extremities: No edema or cyanosis. Warm and well-perfused.  Neurologic: CN II-XII intact. Alert and oriented to self, place and time. Normal reflexes and motor skills, balance and coordination. Moving all extremities without gross abnormality.  Psychiatric: No unusual anxiety or evidence of depression. Appropriate affect.  Studies Reviewed: CT neck 07/16 reviewed   Assessment/Plan Sonya James is a 55 y.o. female with past medical history of uncontrolled hypertension, diabetes, PE, COPD and obesity, with history of cellulitis following left tragal piercing, s/p in-office drainage/partial excision of epidermoid cyst of left external ear on 08/19/2020 with subsequent chondritis. Currently on VI antibiotics via PICC. To OR today for excisional biopsy of left auricular lesion with possible endaural meatoplasty. Reviewed risks of surgery, including recurrence of lesion, persistent ear deformity, infection,  bleeding, wound breakdown. Patient expressed understanding and agreement.    Sonya James 06/15/2021, 12:16 PM

## 2021-06-15 NOTE — Op Note (Signed)
OPERATIVE NOTE  Sonya James Date/Time of Admission: 06/15/2021 10:14 AM  CSN: [id number];EHU:[id number] Attending Provider: Ebbie Latus A, DO Room/Bed: MCPO/NONE DOB: 1966-03-23 Age: 55 y.o.   Pre-Op Diagnosis: Left auricular lesion  Post-Op Diagnosis: Left auricular lesion  Procedure: Procedure(s): Excision of 1.0cm left auricular lesion (78588) with endaural meatoplasty (50277)  Anesthesia: General  Surgeon(s): Jason Coop, DO  Staff: Circulator: Burroughs, Lisbeth Renshaw, RN; [place] Crane, Amy T, RN Relief Scrub: Zannie Kehr, RN Scrub Person: Melody Comas, RN; Dollene Cleveland T  Implants: * No implants in log *  Specimens: ID Type Source Tests Collected by Time Destination  1 : Left auricular lesion Tissue PATH Soft tissue SURGICAL PATHOLOGY Marisah Laker A, DO 41/10/8784 [id number]     Complications: None  EBL: 10 ML  Condition: stable  Operative Findings:  Small cystic lesion of conchal bowl, no frank purulence encountered. Diffuse distortion of landmarks of pinna secondary to scarring. Minimal meatal opening.   Description of Operation: After the identity of the patient and informed consent were confirmed in the preop holding area patient was brought back to the OR and placed in the supine position on the table. General anesthesia was induced. Care of the patient was then turned over to the operating team. The area of the left auricular cyst was injected with 3 cc of 1% lidocaine with 1:100,000 epinephrine and the site was prepped and draped in a sterile fashion.  Incisions were planned using a marking pen, with first incision at the anterior border of the concha cavum and at the entrance of the external auditory meatus.  Additional 1 cm incisions were planned in an inverted "V" at the midportion of the first incision.  These incisions were then made using a 15 blade, creating 3 triangular skin flaps.  The skin flaps were then further developed separating the  skin from the underlying cartilage using iris scissors.  Retention sutures were then placed at the apices of the skin flaps.  Attention was first turned to the irregular cystic lesion.  This was sharply excised from the surrounding tissue using iris and scissors, ensuring preservation of the capsule.  It was sent as a specimen to pathology.  Once the specimen was passed off the field, an ellipse measuring approximately 1 cm of the underlying cartilage was then excised.an additional 1 cm transverse incision was made in the posterior canal wall, creating two intramedial skin flaps.  5-0 Vicryl rapide was then used to approximate the beginning of the intrameatal skin incision in the apex of the middle triangular skin flap, thus widening the entrance of the external auditory canal.  The 2 redundant triangular skin flaps were then sharply excised, and the 5-0 Vicryl Rapide was used in interrupted fashion to approximate the skin edges.Xeroform gauze was then rolled and placed in the external auditory canal. Bacitracin ointment was applied to the suture lines, and a Glasscock dressing was applied.  The patient was then turned over to the care of anesthesia, and taken to the PACU in stable condition.    Jason Coop, [facility]  06/15/2021

## 2021-06-15 NOTE — Telephone Encounter (Signed)
Was able to finally get in touch with the [facility] pharmacist to relay orders to stop antibiotics tomorrow on 10/6 and pull PICC. Thanks! Sonya James

## 2021-06-15 NOTE — Transfer of Care (Signed)
Immediate Anesthesia Transfer of Care Note  Patient: Sonya James  Procedure(s) Performed: Excision of left auricular lesion with endaural meatoplasty (Left: Ear)  Patient Location: PACU  Anesthesia Type:General  Level of Consciousness: awake, alert  and oriented  Airway & Oxygen Therapy: Patient Spontanous Breathing  Post-op Assessment: Report given to RN and Post -op Vital signs reviewed and stable  Post vital signs: Reviewed and stable  Last Vitals:  Vitals Value Taken Time  BP 169/74 06/15/21 1442  Temp    Pulse 73 06/15/21 1443  Resp 13 06/15/21 1443  SpO2 96 % 06/15/21 1443  Vitals shown include unvalidated device data.  Last Pain:  Vitals:   06/15/21 1053  TempSrc:   PainSc: 0-No pain         Complications: No notable events documented.

## 2021-06-15 NOTE — Anesthesia Preprocedure Evaluation (Addendum)
Anesthesia Evaluation  Patient identified by MRN, date of birth, ID band Patient awake    Reviewed: Allergy & Precautions, NPO status , Patient's Chart, lab work & pertinent test results  Airway Mallampati: II  TM Distance: >3 FB Neck ROM: Full    Dental no notable dental hx. (+) Dental Advisory Given   Pulmonary asthma , sleep apnea and Continuous Positive Airway Pressure Ventilation , COPD,  COPD inhaler, Current Smoker,    Pulmonary exam normal        Cardiovascular hypertension, Pt. on medications Normal cardiovascular exam     Neuro/Psych PSYCHIATRIC DISORDERS Anxiety Depression negative neurological ROS     GI/Hepatic Neg liver ROS, GERD  Medicated,  Endo/Other  diabetesMorbid obesity  Renal/GU negative Renal ROS     Musculoskeletal negative musculoskeletal ROS (+)   Abdominal   Peds  Hematology negative hematology ROS (+)   Anesthesia Other Findings   Reproductive/Obstetrics                            Anesthesia Physical Anesthesia Plan  ASA: 3  Anesthesia Plan: General   Post-op Pain Management:    Induction: Intravenous  PONV Risk Score and Plan: 4 or greater and Ondansetron, Dexamethasone and Scopolamine patch - Pre-op  Airway Management Planned: Oral ETT  Additional Equipment:   Intra-op Plan:   Post-operative Plan: Extubation in OR  Informed Consent: I have reviewed the patients History and Physical, chart, labs and discussed the procedure including the risks, benefits and alternatives for the proposed anesthesia with the patient or authorized representative who has indicated his/her understanding and acceptance.     Dental advisory given  Plan Discussed with: Anesthesiologist, CRNA and Surgeon  Anesthesia Plan Comments:        Anesthesia Quick Evaluation

## 2021-06-15 NOTE — Progress Notes (Signed)
CBG this AM in short stay 307. Patient didn't have any DM medicine last night and this morning. Patient received insulin Novolog 10 units s.c. per Dr. Tobias Alexander verbal order. Will continue to monitor.

## 2021-06-15 NOTE — Anesthesia Procedure Notes (Signed)
Procedure Name: Intubation Date/Time: 06/15/2021 12:43 PM Performed by: Griffin Dakin, CRNA Pre-anesthesia Checklist: Patient identified, Emergency Drugs available, Suction available and Patient being monitored Patient Re-evaluated:Patient Re-evaluated prior to induction Oxygen Delivery Method: Circle system utilized Preoxygenation: Pre-oxygenation with 100% oxygen Induction Type: IV induction Ventilation: Mask ventilation without difficulty Grade View: Grade II Tube type: Oral Tube size: 7.0 mm Number of attempts: 1 Airway Equipment and Method: Stylet and Oral airway Placement Confirmation: ETT inserted through vocal cords under direct vision, positive ETCO2 and breath sounds checked- equal and bilateral Secured at: 23 cm Tube secured with: Tape Dental Injury: Teeth and Oropharynx as per pre-operative assessment

## 2021-06-15 NOTE — Anesthesia Postprocedure Evaluation (Signed)
Anesthesia Post Note  Patient: Sonya James  Procedure(s) Performed: Excision of left auricular lesion with endaural meatoplasty (Left: Ear)     Patient location during evaluation: PACU Anesthesia Type: General Level of consciousness: sedated Pain management: pain level controlled Vital Signs Assessment: post-procedure vital signs reviewed and stable Respiratory status: spontaneous breathing and respiratory function stable Cardiovascular status: stable Postop Assessment: no apparent nausea or vomiting Anesthetic complications: no   No notable events documented.  Last Vitals:  Vitals:   06/15/21 1456 06/15/21 1511  BP: (!) 155/70 (!) 161/77  Pulse: 72 70  Resp: 16 15  Temp:    SpO2: 97% 100%    Last Pain:  Vitals:   06/15/21 1442  TempSrc:   PainSc: Asleep                 Johnetta Sloniker DANIEL

## 2021-06-15 NOTE — Progress Notes (Signed)
CBG decreased to 245. Per Dr. Nathanial Rancher verbal order patient received additional 5 units s.c of Novolog insulin.

## 2021-06-16 ENCOUNTER — Other Ambulatory Visit: Payer: Self-pay | Admitting: Pharmacist

## 2021-06-16 ENCOUNTER — Other Ambulatory Visit (HOSPITAL_COMMUNITY): Payer: Self-pay

## 2021-06-16 ENCOUNTER — Encounter (HOSPITAL_COMMUNITY): Payer: Self-pay | Admitting: Otolaryngology

## 2021-06-16 DIAGNOSIS — H61032 Chondritis of left external ear: Secondary | ICD-10-CM

## 2021-06-16 DIAGNOSIS — B379 Candidiasis, unspecified: Secondary | ICD-10-CM

## 2021-06-16 LAB — SURGICAL PATHOLOGY

## 2021-06-16 MED ORDER — LINEZOLID 600 MG PO TABS
600.0000 mg | ORAL_TABLET | Freq: Two times a day (BID) | ORAL | 0 refills | Status: DC
Start: 2021-06-16 — End: 2021-10-31

## 2021-06-16 MED ORDER — FLUCONAZOLE 150 MG PO TABS: 150.0000 mg | ORAL_TABLET | Freq: Every day | ORAL | 0 refills | Status: DC

## 2021-06-16 NOTE — Telephone Encounter (Signed)
[facility] phone line down, so called patient. She stated [facility] nurse just pulled PICC this morning. She is taking Cymbalta 60mg  daily, so having Butch Penny look into Sivextro coverage for 1 week. I will update you and will call patient with final decision.   She also complains of a yeast infection due to the multiple changes in antibiotics, so I will send in Diflucan 150mg  x1.   Thanks!

## 2021-06-16 NOTE — Telephone Encounter (Signed)
Sivextro required a PA, so sent in linezolid x 7 days to [facility]. Informed her of the small risk of developing serotonin syndrome while on linezolid and Cymbalta which she takes daily for fibromyalgia. Thank you.

## 2021-06-27 ENCOUNTER — Encounter (INDEPENDENT_AMBULATORY_CARE_PROVIDER_SITE_OTHER): Payer: Self-pay

## 2021-06-27 ENCOUNTER — Encounter: Payer: Self-pay | Admitting: Internal Medicine

## 2021-06-27 ENCOUNTER — Other Ambulatory Visit: Payer: Self-pay

## 2021-06-27 ENCOUNTER — Ambulatory Visit (INDEPENDENT_AMBULATORY_CARE_PROVIDER_SITE_OTHER): Payer: Medicaid Other | Admitting: Internal Medicine

## 2021-06-27 VITALS — BP 178/84 | HR 71 | Temp 98.2°F | Ht 65.0 in | Wt 231.1 lb

## 2021-06-27 DIAGNOSIS — H61032 Chondritis of left external ear: Secondary | ICD-10-CM

## 2021-06-27 DIAGNOSIS — I2782 Chronic pulmonary embolism: Secondary | ICD-10-CM

## 2021-06-27 DIAGNOSIS — Z794 Long term (current) use of insulin: Secondary | ICD-10-CM

## 2021-06-27 DIAGNOSIS — E1165 Type 2 diabetes mellitus with hyperglycemia: Secondary | ICD-10-CM

## 2021-06-27 DIAGNOSIS — I10 Essential (primary) hypertension: Secondary | ICD-10-CM | POA: Diagnosis not present

## 2021-06-27 DIAGNOSIS — R232 Flushing: Secondary | ICD-10-CM | POA: Diagnosis not present

## 2021-06-27 MED ORDER — LISINOPRIL 40 MG PO TABS: 40.0000 mg | ORAL_TABLET | Freq: Every day | ORAL | 0 refills | Status: DC

## 2021-06-27 MED ORDER — AMLODIPINE BESYLATE 5 MG PO TABS: 5.0000 mg | ORAL_TABLET | Freq: Every day | ORAL | 3 refills | Status: DC

## 2021-06-27 NOTE — Assessment & Plan Note (Signed)
S/p X excision of left auricular lesion with meatoplasty Followed by ENT S/p IV abx, on linezolid currently

## 2021-06-27 NOTE — Patient Instructions (Signed)
Please start taking amlodipine and lisinopril as prescribed.  Please continue to take insulin as discussed and check blood glucose before meals.  You are being referred to endocrinology for diabetes management.  Please follow low-carb and low-salt diet.  Please bring all home medications in the next visit.

## 2021-06-27 NOTE — Assessment & Plan Note (Signed)
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 

## 2021-06-27 NOTE — Assessment & Plan Note (Signed)
Lab Results  Component Value Date   HGBA1C 10.8 (H) 05/03/2021   On Metformin, Glipizide and Novolin 70/30 - 100 U BID, compliance is questionable Referred to a different Endocrinology as per patient request Advised to follow diabetic diet On statin and ACEi Diabetic eye exam: Advised to follow up with Ophthalmology for diabetic eye exam

## 2021-06-27 NOTE — Assessment & Plan Note (Signed)
Due to smoking history, not a candidate for HRT Since she is on Linzess currently, would avoid adding SSRI or SNRI for now

## 2021-06-27 NOTE — Progress Notes (Signed)
Established Patient Office Visit  Subjective:  Patient ID: Sonya James, female    DOB: 02/06/1966  Age: 55 y.o. MRN: [id number]  CC:  Chief Complaint  Patient presents with   Follow-up    HPI Sonya James presents for f/u of her chronic medical conditions.  HTN: Her BP was elevated in the office today. She has not been taking her medications regularly. She has chronic, generalized headache, but denies any chest pain, dyspnea or palpitations. She takes Eliquis for h/o unprovoked PE.  Type 2 DM: She uses Novolin 100 U BID, compliance is questionable. She has not been checking her blood glucose regularly due to severe finger pain/stiffness due to repeated checks. She takes Metformin and Glipizide. She requests a referral to a different Endocrinology clinic.  She recently had chondritis of left ear and had ear surgery, completed IV abx through PICC line, which has been removed.  She c/o hot flashes, but she currently smokes. She understands that she can't take HRT due to her smoking. She is trying to cut down.  Past Medical History:  Diagnosis Date   Anemia    Anxiety    Arthritis    Asthma    Asthma 05/25/2013   Autonomic neuropathy    Cancer of endometrium ([place]) 10/18/2015   Cellulitis, face 05/03/2021   Constipation    COPD (chronic obstructive pulmonary disease) ([place])    CTS (carpal tunnel syndrome)    Depression    Diabetes mellitus without complication ([place])    Dysphagia 12/14/2016   Encounter for screening colonoscopy    Endometrial cancer ([place]) 2017   Gastritis without bleeding    GERD (gastroesophageal reflux disease)    Gross hematuria 05/11/2019   Headache(784.0)    Heavy menses 10/11/2015   HTN (hypertension)    Hypercholesterolemia    Hypothyroidism    IBS (irritable bowel syndrome)    Osteoarthritis    osteoarthritis   Pancreatitis    per patient    Recurrent boils    buttocks and low back.   S/P laparoscopic hysterectomy 11/04/2015   Shortness of  breath    Sleep apnea    CPAP machine   Uncontrolled type 2 diabetes mellitus with diabetic polyneuropathy, with long-term current use of insulin 08/04/2017   Uterine cancer ([place]) 2017   Vitamin D deficiency     Past Surgical History:  Procedure Laterality Date   ABDOMINAL HYSTERECTOMY  2018   endometrial cancer, surgery done at [facility]  01/02/2017   Procedure: BIOPSY;  Surgeon: Danie Binder, MD;  Location: [facility];  Service: Endoscopy;;  gastric biopsy   CESAREAN SECTION  1989   COLONOSCOPY WITH PROPOFOL N/A 01/02/2017   Dr. Oneida Alar: redundant colon, two 2-3 mm polyps in sigmoid colon (hyperplastic)   ESOPHAGOGASTRODUODENOSCOPY  05/22/2011   Dr. Oneida Alar: H.pylori gastritis    ESOPHAGOGASTRODUODENOSCOPY (EGD) WITH PROPOFOL N/A 01/02/2017   Dr. 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 s/p dilation, moderate gastritis (negative H.pylori)   FOOT SURGERY Left    tendon repair   IR US GUIDE VASC ACCESS LEFT  01/18/2021   OOPHORECTOMY     OTOPLASATY Left 06/15/2021   Procedure: Excision of left auricular lesion with endaural meatoplasty;  Surgeon: Jason Coop, DO;  Location: [facility];  Service: ENT;  Laterality: Left;   POLYPECTOMY  01/02/2017   Procedure: POLYPECTOMY;  Surgeon: Danie Binder, MD;  Location: [facility];  Service: Endoscopy;;  sigmoid colon polyps times  2   REDUCTION MAMMAPLASTY  1996   SAVORY DILATION N/A 01/02/2017   Procedure: SAVORY DILATION;  Surgeon: Danie Binder, MD;  [facility]: [facility];  Service: Endoscopy;  Laterality: N/A;   TUBAL LIGATION      Family History  Problem Relation Age of Onset   Diabetes Mother    Hypertension Mother    COPD Mother    Asthma Mother    Hypercholesterolemia Mother    Hypertension Sister    Hyperlipidemia Sister    Diabetes Sister    Asthma Brother    Alcohol abuse Brother    Asthma Son    Cancer Maternal Grandmother        lung, throat, breast   Alzheimer's disease Maternal Grandmother     Hypertension Maternal Grandmother    Hypercholesterolemia Maternal Grandmother    Heart disease Maternal Grandfather    Stroke Maternal Grandfather    Clotting disorder Maternal Grandfather        blood clots in legs   Hypertension Sister    Hyperlipidemia Sister    Stroke Maternal Aunt    Clotting disorder Maternal Aunt        blood clots in legs   Hypertension Maternal Aunt    Hypercholesterolemia Maternal Aunt    Hypertension Maternal Aunt    Asthma Maternal Aunt    Clotting disorder Maternal Uncle        blood clot -legs traveled to heart and he died of heart attack   Colon cancer Neg Hx     Social History   Socioeconomic History   Marital status: Married    Spouse name: Not on file   Number of children: 1   Years of education: Not on file   Highest education level: Not on file  Occupational History   Not on file  Tobacco Use   Smoking status: Some Days    Packs/day: 1.00    Years: 22.00    Pack years: 22.00    Types: Cigarettes   Smokeless tobacco: Never  Vaping Use   Vaping Use: Never used  Substance and Sexual Activity   Alcohol use: No   Drug use: No   Sexual activity: Yes    Birth control/protection: Surgical    Comment: hyst  Other Topics Concern   Not on file  Social History Narrative   Lives with husband and she has been married to for 9 years.  Recently got evicted and has been living in a motel since last July.   She has 1 son that lives in [place].  Reports one grandbaby      Enjoys watching TV mostly animal [place].      Diet: Eats all food groups   Caffeine: Soda not diet, green tea, coffee about 3 cups of caffeine per day.   Water: Drinks 5-6 8 ounce bottles throughout the day.      Wears a seatbelt.  Currently does not have a vehicle.  Smoke detectors in the facility she is living in.  Does not have any weapons.   Social Determinants of Health   Financial Resource Strain: Not on file  Food Insecurity: Not on file  Transportation  Needs: Not on file  Physical Activity: Not on file  Stress: Not on file  Social Connections: Not on file  Intimate Partner Violence: Not on file    Outpatient Medications Prior to Visit  Medication Sig Dispense Refill   Accu-Chek Softclix Lancets lancets To test glucose 4 times a day  150 each 2   albuterol (PROVENTIL) (2.5 MG/3ML) 0.083% nebulizer solution Take 3 mLs (2.5 mg total) by nebulization every 6 (six) hours as needed for wheezing. 75 mL 12   albuterol (VENTOLIN HFA) 108 (90 Base) MCG/ACT inhaler Inhale 2 puffs into the lungs every 6 (six) hours as needed for wheezing or shortness of breath. 18 g 5   apixaban (ELIQUIS) 5 MG TABS tablet Take 1 tablet (5 mg total) by mouth 2 (two) times daily. On 08/12/17 @ 9PM, start 1 tab (5 mg) two times daily. (Patient taking differently: Take 5 mg by mouth 2 (two) times daily.) 60 tablet 5   atorvastatin (LIPITOR) 80 MG tablet Take 1 tablet by mouth once daily 90 tablet 0   Blood Glucose Monitoring Suppl (ACCU-CHEK GUIDE ME) w/Device KIT 1 Piece by Does not apply route as directed. 1 kit 0   clotrimazole (GYNE-LOTRIMIN 3) 2 % vaginal cream Place 1 Applicatorful vaginally at bedtime. (Patient taking differently: Place 1 Applicatorful vaginally at bedtime as needed (irritation).) 21 g 0   cycloSPORINE (RESTASIS) 0.05 % ophthalmic emulsion Place 1 drop into both eyes 2 (two) times daily.     DULoxetine (CYMBALTA) 60 MG capsule Take 60 mg by mouth daily.     esomeprazole (NEXIUM) 40 MG capsule Take 1 capsule (40 mg total) by mouth 2 (two) times daily before a meal. 180 capsule 3   fluconazole (DIFLUCAN) 150 MG tablet Take 1 tablet (150 mg total) by mouth daily. 1 tablet 0   furosemide (LASIX) 40 MG tablet Take 1 tablet by mouth once daily 90 tablet 0   glipiZIDE (GLUCOTROL XL) 5 MG 24 hr tablet Take 1 tablet by mouth once daily with breakfast 30 tablet 0   glucose blood (ACCU-CHEK GUIDE) test strip Test glucose 4 times a day 150 each 2   hydrocortisone  (ANUSOL-HC) 2.5 % rectal cream Place 1 application rectally 2 (two) times daily. As needed for itching (Patient taking differently: Place 1 application rectally 2 (two) times daily as needed for anal itching.) 30 g 1   insulin NPH-regular Human (NOVOLIN 70/30 RELION) (70-30) 100 UNIT/ML injection Inject 60 Units into the skin 2 (two) times daily with a meal. (Patient taking differently: Inject 100 Units into the skin 2 (two) times daily with a meal.) 30 mL 2   Insulin Pen Needle (B-D ULTRAFINE III SHORT PEN) 31G X 8 MM MISC Use to inject insulin 2 times daily. 100 each 2   linaclotide (LINZESS) 290 MCG CAPS capsule Take 1 capsule (290 mcg total) by mouth daily before breakfast. 90 capsule 3   linezolid (ZYVOX) 600 MG tablet Take 1 tablet (600 mg total) by mouth 2 (two) times daily. 14 tablet 0   metFORMIN (GLUCOPHAGE) 1000 MG tablet Take 1 tablet (1,000 mg total) by mouth 2 (two) times daily with a meal. 60 tablet 2   montelukast (SINGULAIR) 10 MG tablet TAKE 1 TABLET BY MOUTH AT BEDTIME 30 tablet 5   ondansetron (ZOFRAN-ODT) 4 MG disintegrating tablet Take 4 mg by mouth every 6 (six) hours as needed for nausea or vomiting.     potassium chloride (KLOR-CON) 10 MEQ tablet TAKE 1 TABLET BY MOUTH ONCE DAILY IN THE EVENING 90 tablet 0   rizatriptan (MAXALT) 10 MG tablet Take 10 mg by mouth daily as needed for migraine.     topiramate (TOPAMAX) 25 MG tablet Take 50 mg by mouth daily.     Vitamin D, Ergocalciferol, (DRISDOL) 1.25 MG (50000 UNIT) CAPS  capsule Take 1 capsule by mouth once a week 8 capsule 0   amLODipine (NORVASC) 5 MG tablet Take 1 tablet (5 mg total) by mouth daily. 30 tablet 3   lisinopril (ZESTRIL) 40 MG tablet Take 1 tablet by mouth once daily 30 tablet 0   mupirocin ointment (BACTROBAN) 2 % SMARTSIG:1 Application Topical 2-3 Times Daily     No facility-administered medications prior to visit.    No Known Allergies  ROS Review of Systems  Constitutional:  Negative for chills and  fever.  HENT:  Negative for congestion, sinus pressure, sinus pain and sore throat.   Eyes:  Negative for pain and discharge.  Respiratory:  Positive for shortness of breath. Negative for cough.   Cardiovascular:  Negative for chest pain and palpitations.  Gastrointestinal:  Negative for abdominal pain, diarrhea, nausea and vomiting.  Endocrine: Negative for polydipsia and polyuria.       Hot flashes  Genitourinary:  Negative for dysuria and hematuria.  Musculoskeletal:  Positive for back pain. Negative for neck pain and neck stiffness.  Skin:  Negative for rash.  Neurological:  Negative for dizziness and weakness.  Psychiatric/Behavioral:  Negative for agitation and behavioral problems.      Objective:    Physical Exam Vitals reviewed.  Constitutional:      General: She is not in acute distress.    Appearance: She is obese. She is not diaphoretic.  HENT:     Head: Normocephalic and atraumatic.     Nose: Nose normal. No congestion.     Mouth/Throat:     Mouth: Mucous membranes are moist.     Pharynx: No posterior oropharyngeal erythema.  Eyes:     General: No scleral icterus.    Extraocular Movements: Extraocular movements intact.  Cardiovascular:     Rate and Rhythm: Normal rate and regular rhythm.     Pulses: Normal pulses.     Heart sounds: Murmur (Systolic over upper sternal border) heard.  Pulmonary:     Breath sounds: Normal breath sounds. No wheezing or rales.  Abdominal:     Palpations: Abdomen is soft.     Tenderness: There is no abdominal tenderness.  Musculoskeletal:     Cervical back: Neck supple. No tenderness.     Right lower leg: No edema.     Left lower leg: No edema.  Skin:    General: Skin is warm.     Findings: No rash.  Neurological:     General: No focal deficit present.     Mental Status: She is alert and oriented to person, place, and time.  Psychiatric:        Mood and Affect: Mood normal.        Behavior: Behavior normal.    BP (!)  178/84 (BP Location: Left Arm, Patient Position: Sitting, Cuff Size: Large)   Pulse 71   Temp 98.2 F (36.8 C) (Oral)   Ht _0  (1.651 m)   Wt 231 lb 1.9 oz (104.8 kg)   LMP 09/20/2015 (Exact Date)   SpO2 98%   BMI 38.46 kg/m  Wt Readings from Last 3 Encounters:  06/27/21 231 lb 1.9 oz (104.8 kg)  06/15/21 229 lb 15 oz (104.3 kg)  05/23/21 231 lb (104.8 kg)     Health Maintenance Due  Topic Date Due   Hepatitis C Screening  Never done   PAP SMEAR-Modifier  12/26/2020   FOOT EXAM  01/19/2021   OPHTHALMOLOGY EXAM  02/09/2021    There are  no preventive care reminders to display for this patient.  Lab Results  Component Value Date   TSH 1.530 05/03/2021   Lab Results  Component Value Date   WBC 7.3 06/09/2021   HGB 13.4 06/09/2021   HCT 41.5 06/09/2021   MCV 93.7 06/09/2021   PLT 351 06/09/2021   Lab Results  Component Value Date   NA 136 06/15/2021   K 3.4 (L) 06/15/2021   CO2 27 06/15/2021   GLUCOSE 230 (H) 06/15/2021   BUN 11 06/15/2021   CREATININE 0.86 06/15/2021   BILITOT 0.8 06/09/2021   ALKPHOS 100 06/09/2021   AST 13 (L) 06/09/2021   ALT 11 06/09/2021   PROT 6.8 06/09/2021   ALBUMIN 3.5 06/09/2021   CALCIUM 9.7 06/15/2021   ANIONGAP 5 06/15/2021   Lab Results  Component Value Date   CHOL 206 (H) 11/01/2020   Lab Results  Component Value Date   HDL 42 11/01/2020   Lab Results  Component Value Date   LDLCALC 136 (H) 11/01/2020   Lab Results  Component Value Date   TRIG 155 (H) 11/01/2020   Lab Results  Component Value Date   CHOLHDL 5.3 04/19/2020   Lab Results  Component Value Date   HGBA1C 10.8 (H) 05/03/2021      Assessment & Plan:   Problem List Items Addressed This Visit       Cardiovascular and Mediastinum   Essential hypertension, benign - Primary    BP Readings from Last 1 Encounters:  06/27/21 (!) 178/84  uncontrolled due to noncompliance On Amlodipine 5 mg QD and lisinopril 40 mg daily Counseled for compliance  with the medications Advised DASH diet and moderate exercise/walking, at least 150 mins/week       Relevant Medications   lisinopril (ZESTRIL) 40 MG tablet   amLODipine (NORVASC) 5 MG tablet   Pulmonary embolism ([facility])    Unprovoked On Eliquis 5 mg BID Followed by heme-onc.      Relevant Medications   lisinopril (ZESTRIL) 40 MG tablet   amLODipine (NORVASC) 5 MG tablet   Hot flashes    Due to smoking history, not a candidate for HRT Since she is on Linzess currently, would avoid adding SSRI or SNRI for now      Relevant Medications   lisinopril (ZESTRIL) 40 MG tablet   amLODipine (NORVASC) 5 MG tablet     Endocrine   Diabetes mellitus ([place])    Lab Results  Component Value Date   HGBA1C 10.8 (H) 05/03/2021  On Metformin, Glipizide and Novolin 70/30 - 100 U BID, compliance is questionable Referred to a different Endocrinology as per patient request Advised to follow diabetic diet On statin and ACEi Diabetic eye exam: Advised to follow up with Ophthalmology for diabetic eye exam       Relevant Medications   lisinopril (ZESTRIL) 40 MG tablet   Other Relevant Orders   Ambulatory referral to Endocrinology     Nervous and Auditory   Chondritis of external ear, left    S/p X excision of left auricular lesion with meatoplasty Followed by ENT S/p IV abx, on linezolid currently       Meds ordered this encounter  Medications   lisinopril (ZESTRIL) 40 MG tablet    Sig: Take 1 tablet (40 mg total) by mouth daily.    Dispense:  30 tablet    Refill:  0   amLODipine (NORVASC) 5 MG tablet    Sig: Take 1 tablet (5 mg total) by mouth  daily.    Dispense:  30 tablet    Refill:  3    Follow-up: Return in about 1 month (around 07/28/2021) for HTN and DM.    Lindell Spar, MD

## 2021-06-27 NOTE — Assessment & Plan Note (Signed)
BP Readings from Last 1 Encounters:  06/27/21 (!) 178/84   uncontrolled due to noncompliance On Amlodipine 5 mg QD and lisinopril 40 mg daily Counseled for compliance with the medications Advised DASH diet and moderate exercise/walking, at least 150 mins/week

## 2021-07-01 ENCOUNTER — Encounter: Payer: Self-pay | Admitting: *Deleted

## 2021-07-08 DIAGNOSIS — H900 Conductive hearing loss, bilateral: Secondary | ICD-10-CM | POA: Diagnosis not present

## 2021-07-13 ENCOUNTER — Other Ambulatory Visit: Payer: Medicaid Other | Admitting: Adult Health

## 2021-07-20 ENCOUNTER — Encounter: Payer: Self-pay | Admitting: Internal Medicine

## 2021-07-20 ENCOUNTER — Ambulatory Visit: Payer: Medicaid Other | Admitting: Gastroenterology

## 2021-07-28 ENCOUNTER — Ambulatory Visit (INDEPENDENT_AMBULATORY_CARE_PROVIDER_SITE_OTHER): Payer: Medicaid Other | Admitting: Internal Medicine

## 2021-07-28 ENCOUNTER — Other Ambulatory Visit: Payer: Self-pay

## 2021-07-28 ENCOUNTER — Encounter: Payer: Self-pay | Admitting: Internal Medicine

## 2021-07-28 VITALS — BP 178/98 | HR 84 | Resp 18 | Ht 65.0 in | Wt 230.0 lb

## 2021-07-28 DIAGNOSIS — E1165 Type 2 diabetes mellitus with hyperglycemia: Secondary | ICD-10-CM | POA: Diagnosis not present

## 2021-07-28 DIAGNOSIS — I10 Essential (primary) hypertension: Secondary | ICD-10-CM | POA: Diagnosis not present

## 2021-07-28 DIAGNOSIS — Z794 Long term (current) use of insulin: Secondary | ICD-10-CM | POA: Diagnosis not present

## 2021-07-28 DIAGNOSIS — R011 Cardiac murmur, unspecified: Secondary | ICD-10-CM

## 2021-07-28 MED ORDER — AMLODIPINE BESYLATE 10 MG PO TABS: 10.0000 mg | ORAL_TABLET | Freq: Every day | ORAL | 1 refills | Status: DC

## 2021-07-28 MED ORDER — LABETALOL HCL 100 MG PO TABS: 100.0000 mg | ORAL_TABLET | Freq: Two times a day (BID) | ORAL | 2 refills | Status: DC

## 2021-07-28 MED ORDER — MISC. DEVICES MISC
0 refills | Status: AC
Start: 2021-07-28 — End: ?

## 2021-07-28 MED ORDER — MISC. DEVICES MISC: 1.0000 | Freq: Once | 0 refills | Status: DC

## 2021-07-28 NOTE — Patient Instructions (Signed)
Please start taking Amlodipine 10 mg instead of 5 mg.  Please start taking Labetalol 100 mg twice daily.  Please follow low carb and low salt diet and perform moderate exercise/walking at least 150 mins/week.

## 2021-07-29 DIAGNOSIS — R011 Cardiac murmur, unspecified: Secondary | ICD-10-CM | POA: Insufficient documentation

## 2021-07-29 NOTE — Assessment & Plan Note (Signed)
BP Readings from Last 1 Encounters:  07/28/21 (!) 178/98   uncontrolled due to noncompliance On Amlodipine 5 mg QD and lisinopril 40 mg daily, increased Amlodipine to 10 mg QD dose Started Labetalol 100 mg BID Counseled for compliance with the medications Advised DASH diet and moderate exercise/walking, at least 150 mins/week

## 2021-07-29 NOTE — Progress Notes (Signed)
Established Patient Office Visit  Subjective:  Patient ID: Sonya James, female    DOB: 10-18-1965  Age: 55 y.o. MRN: [id number]  CC:  Chief Complaint  Patient presents with   Follow-up    1 month follow up HTN and DM has not been checking either at home    HPI Sonya James is a 55 y.o. female with past medical history of HTN, type II DM, HLD, COPD, OSA, PE, GERD, obesity and tobacco abuse who presents for f/u of HTN and type II DM.  HTN: Her BP was elevated in the office today. She has chronic, generalized headache, but denies any chest pain, dyspnea or palpitations. She takes Eliquis for h/o unprovoked PE.  Type 2 DM: She uses Novolin 100 U BID, compliance is questionable. She has not been checking her blood glucose regularly due to severe finger pain/stiffness due to repeated checks. She takes Metformin and Glipizide.  She was referred to endocrinology in the last visit, but has never been able to see them yet.    Past Medical History:  Diagnosis Date   Anemia    Anxiety    Arthritis    Asthma    Asthma 05/25/2013   Autonomic neuropathy    Cancer of endometrium ([place]) 10/18/2015   Cellulitis, face 05/03/2021   Constipation    COPD (chronic obstructive pulmonary disease) ([place])    CTS (carpal tunnel syndrome)    Depression    Diabetes mellitus without complication ([place])    Dysphagia 12/14/2016   Encounter for screening colonoscopy    Endometrial cancer ([place]) 2017   Gastritis without bleeding    GERD (gastroesophageal reflux disease)    Gross hematuria 05/11/2019   Headache(784.0)    Heavy menses 10/11/2015   HTN (hypertension)    Hypercholesterolemia    Hypothyroidism    IBS (irritable bowel syndrome)    Osteoarthritis    osteoarthritis   Pancreatitis    per patient    Recurrent boils    buttocks and low back.   S/P laparoscopic hysterectomy 11/04/2015   Shortness of breath    Sleep apnea    CPAP machine   Uncontrolled type 2 diabetes mellitus with diabetic  polyneuropathy, with long-term current use of insulin 08/04/2017   Uterine cancer ([place]) 2017   Vitamin D deficiency     Past Surgical History:  Procedure Laterality Date   ABDOMINAL HYSTERECTOMY  2018   endometrial cancer, surgery done at [facility]  01/02/2017   Procedure: BIOPSY;  Surgeon: Danie Binder, MD;  [facility];  Service: Endoscopy;;  gastric biopsy   CESAREAN SECTION  1989   COLONOSCOPY WITH PROPOFOL N/A 01/02/2017   Dr. Oneida Alar: redundant colon, two 2-3 mm polyps in sigmoid colon (hyperplastic)   ESOPHAGOGASTRODUODENOSCOPY  05/22/2011   Dr. Oneida Alar: H.pylori gastritis    ESOPHAGOGASTRODUODENOSCOPY (EGD) WITH PROPOFOL N/A 01/02/2017   Dr. 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 s/p dilation, moderate gastritis (negative H.pylori)   FOOT SURGERY Left    tendon repair   IR US GUIDE VASC ACCESS LEFT  01/18/2021   OOPHORECTOMY     OTOPLASATY Left 06/15/2021   Procedure: Excision of left auricular lesion with endaural meatoplasty;  Surgeon: Jason Coop, DO;  Location: [facility];  Service: ENT;  Laterality: Left;   POLYPECTOMY  01/02/2017   Procedure: POLYPECTOMY;  Surgeon: Danie Binder, MD;  [facility];  Service: Endoscopy;;  sigmoid colon polyps times 2  REDUCTION MAMMAPLASTY  1996   SAVORY DILATION N/A 01/02/2017   Procedure: SAVORY DILATION;  Surgeon: Danie Binder, MD;  [facility];  Service: Endoscopy;  Laterality: N/A;   TUBAL LIGATION      Family History  Problem Relation Age of Onset   Diabetes Mother    Hypertension Mother    COPD Mother    Asthma Mother    Hypercholesterolemia Mother    Hypertension Sister    Hyperlipidemia Sister    Diabetes Sister    Asthma Brother    Alcohol abuse Brother    Asthma Son    Cancer Maternal Grandmother        lung, throat, breast   Alzheimer's disease Maternal Grandmother    Hypertension Maternal Grandmother    Hypercholesterolemia Maternal Grandmother    Heart  disease Maternal Grandfather    Stroke Maternal Grandfather    Clotting disorder Maternal Grandfather        blood clots in legs   Hypertension Sister    Hyperlipidemia Sister    Stroke Maternal Aunt    Clotting disorder Maternal Aunt        blood clots in legs   Hypertension Maternal Aunt    Hypercholesterolemia Maternal Aunt    Hypertension Maternal Aunt    Asthma Maternal Aunt    Clotting disorder Maternal Uncle        blood clot -legs traveled to heart and he died of heart attack   Colon cancer Neg Hx     Social History   Socioeconomic History   Marital status: Married    Spouse name: Not on file   Number of children: 1   Years of education: Not on file   Highest education level: Not on file  Occupational History   Not on file  Tobacco Use   Smoking status: Some Days    Packs/day: 1.00    Years: 22.00    Pack years: 22.00    Types: Cigarettes   Smokeless tobacco: Never  Vaping Use   Vaping Use: Never used  Substance and Sexual Activity   Alcohol use: No   Drug use: No   Sexual activity: Yes    Birth control/protection: Surgical    Comment: hyst  Other Topics Concern   Not on file  Social History Narrative   Lives with husband and she has been married to for 9 years.  Recently got evicted and has been living in a motel since last July.   She has 1 son that lives in [place].  Reports one grandbaby      Enjoys watching TV mostly animal [place].      Diet: Eats all food groups   Caffeine: Soda not diet, green tea, coffee about 3 cups of caffeine per day.   Water: Drinks 5-6 8 ounce bottles throughout the day.      Wears a seatbelt.  Currently does not have a vehicle.  Smoke detectors in the facility she is living in.  Does not have any weapons.   Social Determinants of Health   Financial Resource Strain: Not on file  Food Insecurity: Not on file  Transportation Needs: Not on file  Physical Activity: Not on file  Stress: Not on file  Social  Connections: Not on file  Intimate Partner Violence: Not on file    Outpatient Medications Prior to Visit  Medication Sig Dispense Refill   Accu-Chek Softclix Lancets lancets To test glucose 4 times a day 150 each 2  albuterol (PROVENTIL) (2.5 MG/3ML) 0.083% nebulizer solution Take 3 mLs (2.5 mg total) by nebulization every 6 (six) hours as needed for wheezing. 75 mL 12   albuterol (VENTOLIN HFA) 108 (90 Base) MCG/ACT inhaler Inhale 2 puffs into the lungs every 6 (six) hours as needed for wheezing or shortness of breath. 18 g 5   apixaban (ELIQUIS) 5 MG TABS tablet Take 1 tablet (5 mg total) by mouth 2 (two) times daily. On 08/12/17 @ 9PM, start 1 tab (5 mg) two times daily. (Patient taking differently: Take 5 mg by mouth 2 (two) times daily.) 60 tablet 5   atorvastatin (LIPITOR) 80 MG tablet Take 1 tablet by mouth once daily 90 tablet 0   Blood Glucose Monitoring Suppl (ACCU-CHEK GUIDE ME) w/Device KIT 1 Piece by Does not apply route as directed. 1 kit 0   clotrimazole (GYNE-LOTRIMIN 3) 2 % vaginal cream Place 1 Applicatorful vaginally at bedtime. (Patient taking differently: Place 1 Applicatorful vaginally at bedtime as needed (irritation).) 21 g 0   cycloSPORINE (RESTASIS) 0.05 % ophthalmic emulsion Place 1 drop into both eyes 2 (two) times daily.     DULoxetine (CYMBALTA) 60 MG capsule Take 60 mg by mouth daily.     esomeprazole (NEXIUM) 40 MG capsule Take 1 capsule (40 mg total) by mouth 2 (two) times daily before a meal. 180 capsule 3   furosemide (LASIX) 40 MG tablet Take 1 tablet by mouth once daily 90 tablet 0   glipiZIDE (GLUCOTROL XL) 5 MG 24 hr tablet Take 1 tablet by mouth once daily with breakfast 30 tablet 0   glucose blood (ACCU-CHEK GUIDE) test strip Test glucose 4 times a day 150 each 2   hydrocortisone (ANUSOL-HC) 2.5 % rectal cream Place 1 application rectally 2 (two) times daily. As needed for itching (Patient taking differently: Place 1 application rectally 2 (two) times  daily as needed for anal itching.) 30 g 1   insulin NPH-regular Human (NOVOLIN 70/30 RELION) (70-30) 100 UNIT/ML injection Inject 60 Units into the skin 2 (two) times daily with a meal. (Patient taking differently: Inject 100 Units into the skin 2 (two) times daily with a meal.) 30 mL 2   Insulin Pen Needle (B-D ULTRAFINE III SHORT PEN) 31G X 8 MM MISC Use to inject insulin 2 times daily. 100 each 2   linaclotide (LINZESS) 290 MCG CAPS capsule Take 1 capsule (290 mcg total) by mouth daily before breakfast. 90 capsule 3   lisinopril (ZESTRIL) 40 MG tablet Take 1 tablet (40 mg total) by mouth daily. 30 tablet 0   metFORMIN (GLUCOPHAGE) 1000 MG tablet Take 1 tablet (1,000 mg total) by mouth 2 (two) times daily with a meal. 60 tablet 2   montelukast (SINGULAIR) 10 MG tablet TAKE 1 TABLET BY MOUTH AT BEDTIME 30 tablet 5   mupirocin ointment (BACTROBAN) 2 % SMARTSIG:1 Application Topical 2-3 Times Daily     ondansetron (ZOFRAN-ODT) 4 MG disintegrating tablet Take 4 mg by mouth every 6 (six) hours as needed for nausea or vomiting.     potassium chloride (KLOR-CON) 10 MEQ tablet TAKE 1 TABLET BY MOUTH ONCE DAILY IN THE EVENING 90 tablet 0   rizatriptan (MAXALT) 10 MG tablet Take 10 mg by mouth daily as needed for migraine.     topiramate (TOPAMAX) 25 MG tablet Take 50 mg by mouth daily.     Vitamin D, Ergocalciferol, (DRISDOL) 1.25 MG (50000 UNIT) CAPS capsule Take 1 capsule by mouth once a week 8 capsule 0  amLODipine (NORVASC) 5 MG tablet Take 1 tablet (5 mg total) by mouth daily. 30 tablet 3   linezolid (ZYVOX) 600 MG tablet Take 1 tablet (600 mg total) by mouth 2 (two) times daily. 14 tablet 0   fluconazole (DIFLUCAN) 150 MG tablet Take 1 tablet (150 mg total) by mouth daily. (Patient not taking: Reported on 07/28/2021) 1 tablet 0   No facility-administered medications prior to visit.    No Known Allergies  ROS Review of Systems  Constitutional:  Negative for chills and fever.  HENT:  Negative  for congestion, sinus pressure, sinus pain and sore throat.   Eyes:  Negative for pain and discharge.  Respiratory:  Positive for shortness of breath. Negative for cough.   Cardiovascular:  Negative for chest pain and palpitations.  Gastrointestinal:  Negative for abdominal pain, diarrhea, nausea and vomiting.  Endocrine: Negative for polydipsia and polyuria.       Hot flashes  Genitourinary:  Negative for dysuria and hematuria.  Musculoskeletal:  Positive for back pain. Negative for neck pain and neck stiffness.  Skin:  Negative for rash.  Neurological:  Negative for dizziness and weakness.  Psychiatric/Behavioral:  Negative for agitation and behavioral problems.      Objective:    Physical Exam Vitals reviewed.  Constitutional:      General: She is not in acute distress.    Appearance: She is obese. She is not diaphoretic.  HENT:     Head: Normocephalic and atraumatic.     Nose: Nose normal. No congestion.     Mouth/Throat:     Mouth: Mucous membranes are moist.     Pharynx: No posterior oropharyngeal erythema.  Eyes:     General: No scleral icterus.    Extraocular Movements: Extraocular movements intact.  Cardiovascular:     Rate and Rhythm: Normal rate and regular rhythm.     Pulses: Normal pulses.     Heart sounds: Murmur (Systolic over upper sternal border) heard.  Pulmonary:     Breath sounds: Normal breath sounds. No wheezing or rales.  Abdominal:     Palpations: Abdomen is soft.     Tenderness: There is no abdominal tenderness.  Musculoskeletal:     Cervical back: Neck supple. No tenderness.     Right lower leg: No edema.     Left lower leg: No edema.  Skin:    General: Skin is warm.     Findings: No rash.  Neurological:     General: No focal deficit present.     Mental Status: She is alert and oriented to person, place, and time.  Psychiatric:        Mood and Affect: Mood normal.        Behavior: Behavior normal.    BP (!) 178/98 (BP Location: Left Arm,  Patient Position: Sitting, Cuff Size: Normal)   Pulse 84   Resp 18   Ht 5' 5" (1.651 m)   Wt 230 lb 0.6 oz (104.3 kg)   LMP 09/20/2015 (Exact Date)   SpO2 99%   BMI 38.28 kg/m  Wt Readings from Last 3 Encounters:  07/28/21 230 lb 0.6 oz (104.3 kg)  06/27/21 231 lb 1.9 oz (104.8 kg)  06/15/21 229 lb 15 oz (104.3 kg)    Lab Results  Component Value Date   TSH 1.530 05/03/2021   Lab Results  Component Value Date   WBC 7.3 06/09/2021   HGB 13.4 06/09/2021   HCT 41.5 06/09/2021   MCV 93.7 06/09/2021  PLT 351 06/09/2021   Lab Results  Component Value Date   NA 136 06/15/2021   K 3.4 (L) 06/15/2021   CO2 27 06/15/2021   GLUCOSE 230 (H) 06/15/2021   BUN 11 06/15/2021   CREATININE 0.86 06/15/2021   BILITOT 0.8 06/09/2021   ALKPHOS 100 06/09/2021   AST 13 (L) 06/09/2021   ALT 11 06/09/2021   PROT 6.8 06/09/2021   ALBUMIN 3.5 06/09/2021   CALCIUM 9.7 06/15/2021   ANIONGAP 5 06/15/2021   Lab Results  Component Value Date   CHOL 206 (H) 11/01/2020   Lab Results  Component Value Date   HDL 42 11/01/2020   Lab Results  Component Value Date   LDLCALC 136 (H) 11/01/2020   Lab Results  Component Value Date   TRIG 155 (H) 11/01/2020   Lab Results  Component Value Date   CHOLHDL 5.3 04/19/2020   Lab Results  Component Value Date   HGBA1C 10.8 (H) 05/03/2021      Assessment & Plan:   Problem List Items Addressed This Visit       Cardiovascular and Mediastinum   Essential hypertension, benign - Primary    BP Readings from Last 1 Encounters:  07/28/21 (!) 178/98  uncontrolled due to noncompliance On Amlodipine 5 mg QD and lisinopril 40 mg daily, increased Amlodipine to 10 mg QD dose Started Labetalol 100 mg BID Counseled for compliance with the medications Advised DASH diet and moderate exercise/walking, at least 150 mins/week       Relevant Medications   amLODipine (NORVASC) 10 MG tablet   labetalol (NORMODYNE) 100 MG tablet   Misc. Devices MISC      Endocrine   Uncontrolled type 2 diabetes mellitus with hyperglycemia, with long-term current use of insulin ([facility])    Lab Results  Component Value Date   HGBA1C 10.8 (H) 05/03/2021  On Metformin, Glipizide and Novolin 70/30 - 100 U BID, compliance is questionable Referred to a different Endocrinology as per patient request - will assist to make appointment Advised to follow diabetic diet On statin and ACEi Diabetic eye exam: Advised to follow up with Ophthalmology for diabetic eye exam         Other   Systolic murmur    Last Echo reviewed from chart (2016) Referred to Cardiology for possible repeat Echo and further evaluation.      Relevant Orders   Ambulatory referral to Cardiology    Meds ordered this encounter  Medications   amLODipine (NORVASC) 10 MG tablet    Sig: Take 1 tablet (10 mg total) by mouth daily.    Dispense:  90 tablet    Refill:  1   labetalol (NORMODYNE) 100 MG tablet    Sig: Take 1 tablet (100 mg total) by mouth 2 (two) times daily.    Dispense:  60 tablet    Refill:  2   DISCONTD: Misc. Devices MISC    Sig: 1 each by Does not apply route once for 1 dose. Blood pressure monitoring device    Dispense:  1 each    Refill:  0   Misc. Devices MISC    Sig: Blood pressure monitoring device    Dispense:  1 each    Refill:  0    Follow-up: Return in about 3 months (around 10/28/2021) for HTN and DM.    Lindell Spar, MD

## 2021-07-29 NOTE — Assessment & Plan Note (Signed)
Lab Results  Component Value Date   HGBA1C 10.8 (H) 05/03/2021   On Metformin, Glipizide and Novolin 70/30 - 100 U BID, compliance is questionable Referred to a different Endocrinology as per patient request - will assist to make appointment Advised to follow diabetic diet On statin and ACEi Diabetic eye exam: Advised to follow up with Ophthalmology for diabetic eye exam

## 2021-07-29 NOTE — Assessment & Plan Note (Addendum)
Last Echo reviewed from chart (2016) Referred to Cardiology for possible repeat Echo and further evaluation.

## 2021-08-01 DIAGNOSIS — I10 Essential (primary) hypertension: Secondary | ICD-10-CM | POA: Diagnosis not present

## 2021-08-06 ENCOUNTER — Other Ambulatory Visit: Payer: Self-pay | Admitting: Internal Medicine

## 2021-08-06 ENCOUNTER — Other Ambulatory Visit: Payer: Self-pay | Admitting: Nurse Practitioner

## 2021-08-06 ENCOUNTER — Other Ambulatory Visit: Payer: Self-pay | Admitting: Gastroenterology

## 2021-08-06 DIAGNOSIS — I10 Essential (primary) hypertension: Secondary | ICD-10-CM

## 2021-08-09 ENCOUNTER — Ambulatory Visit: Payer: Medicaid Other | Admitting: "Endocrinology

## 2021-08-10 IMAGING — CT CT HEAD WO/W CM
3 of 4 series · 15 of 47 positions shown, 18 images · IV contrast (Omnipaque or Isovue)
Comparison: None.

CLINICAL DATA: Left ear and scalp infection with drainage. Rule out
intracranial abscess.

EXAM:
CT HEAD WITHOUT AND WITH CONTRAST
TECHNIQUE: Contiguous axial images were obtained from the base of the skull
through the vertex without and with intravenous contrast
CONTRAST:  75mL OMNIPAQUE IOHEXOL 300 MG/ML  SOLN

[Series 3: head w o · axial · 0.40mm/px · z∈[+32,+152]mm · 9 of 30 slices shown, 12 images]
[im 3/30  brain]
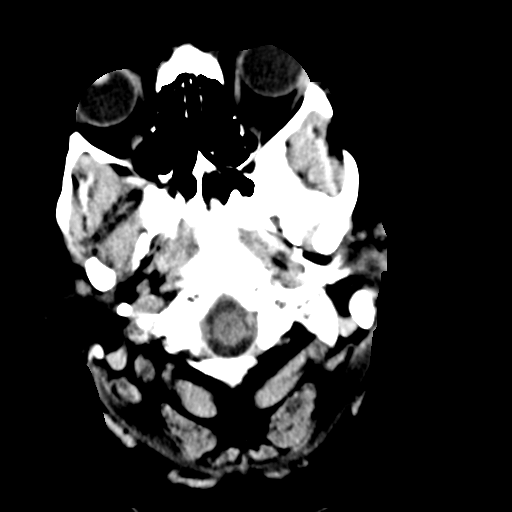
[im 3/30  bone]
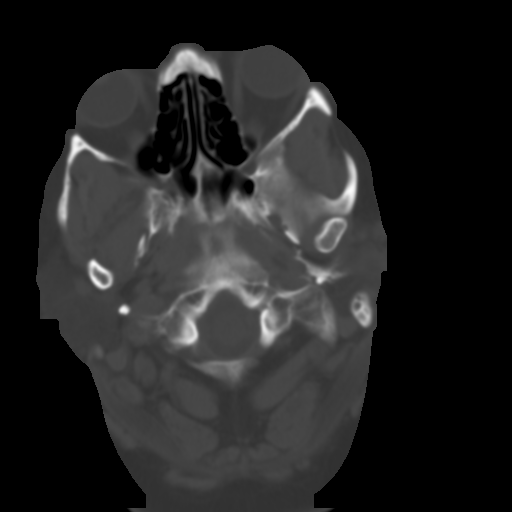
[im 7/30  brain]
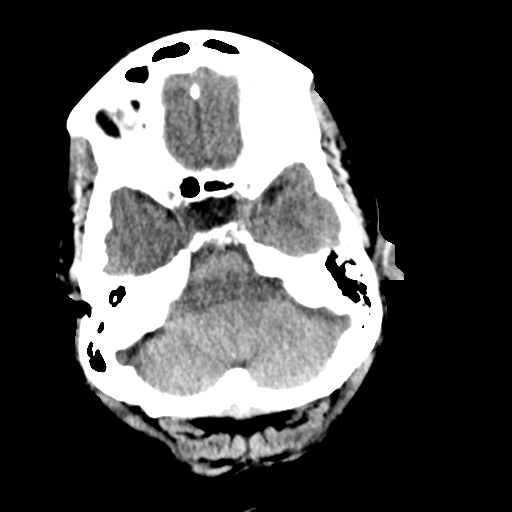
[im 9/30  brain]
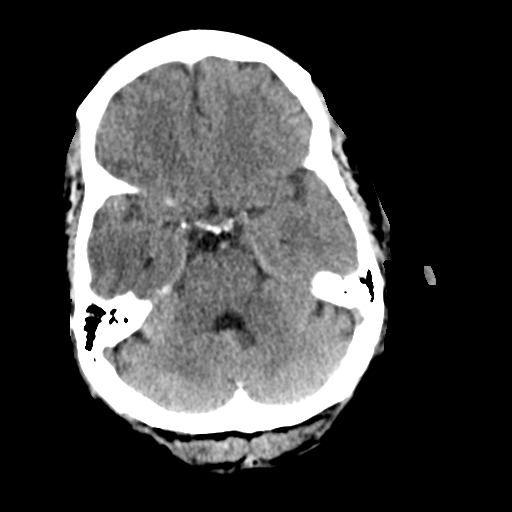
[im 13/30  brain]
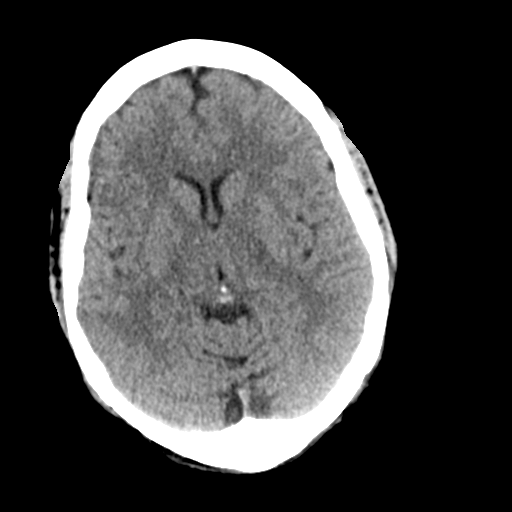
[im 15/30  brain]
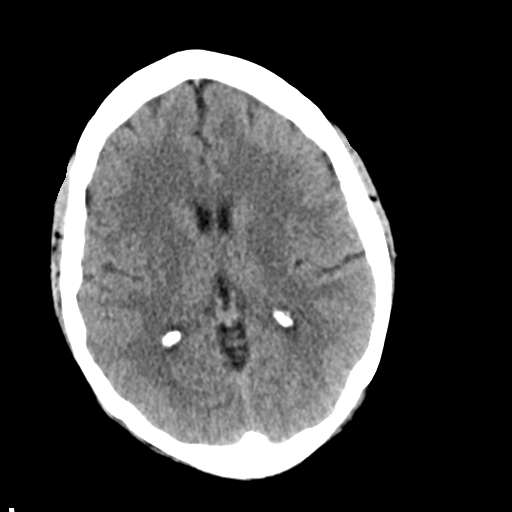
[im 15/30  bone]
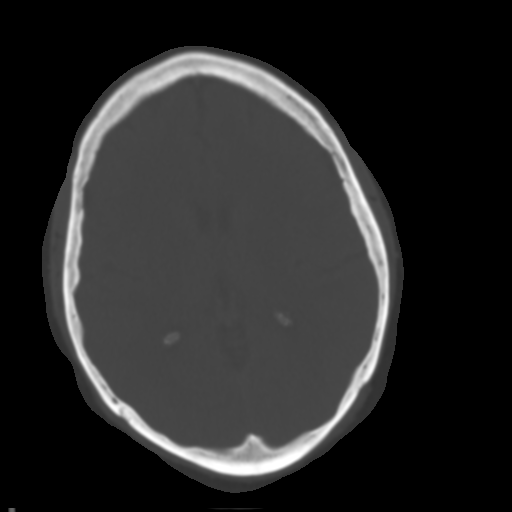
[im 17/30  brain]
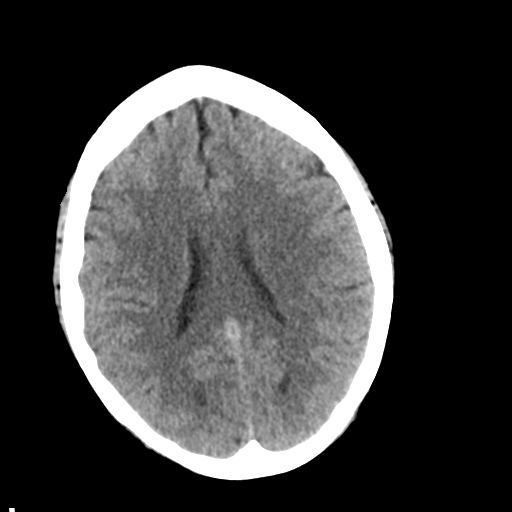
[im 21/30  brain]
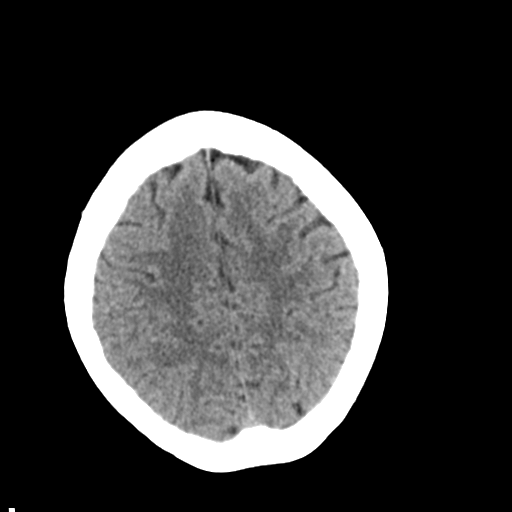
[im 23/30  brain]
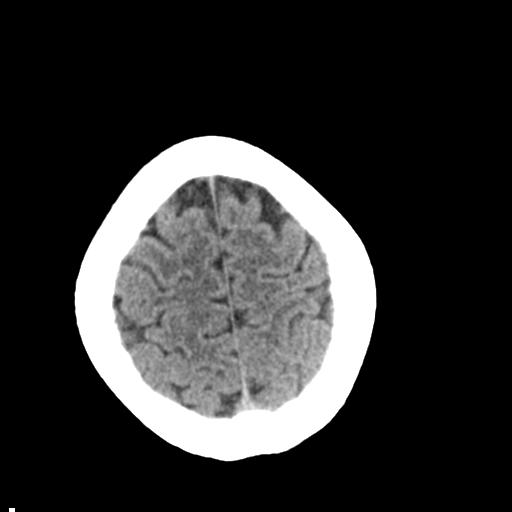
[im 27/30  brain]
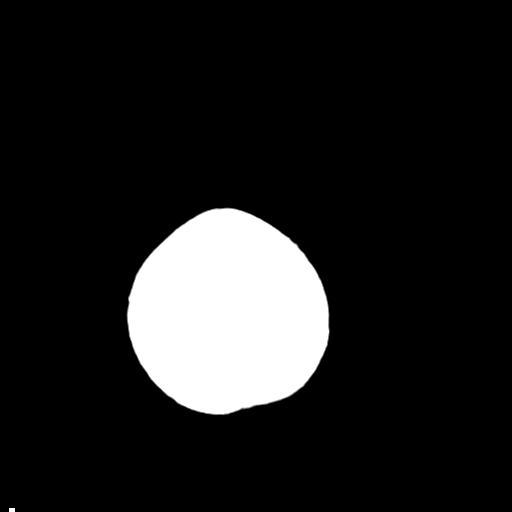
[im 27/30  bone]
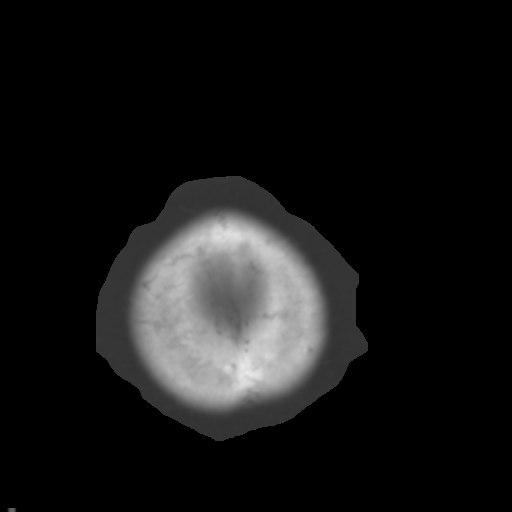

[Series 6: coronal soft · coronal · 0.31mm/px · 3 of 72 slices shown]
[im 24/72  brain]
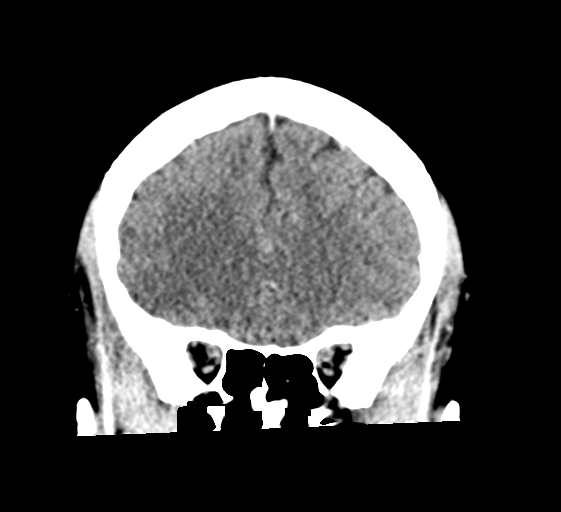
[im 32/72  brain]
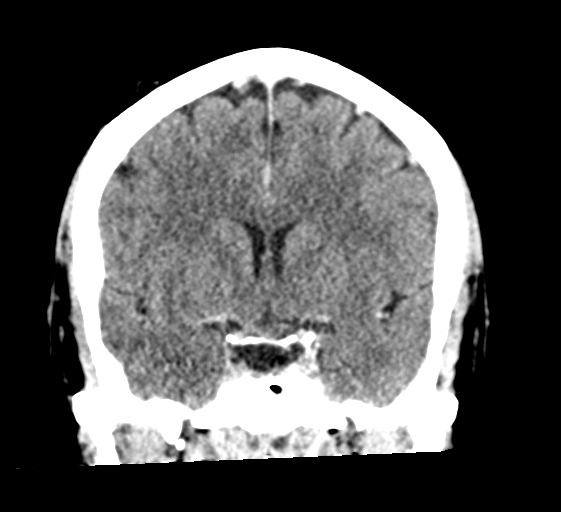
[im 40/72  brain]
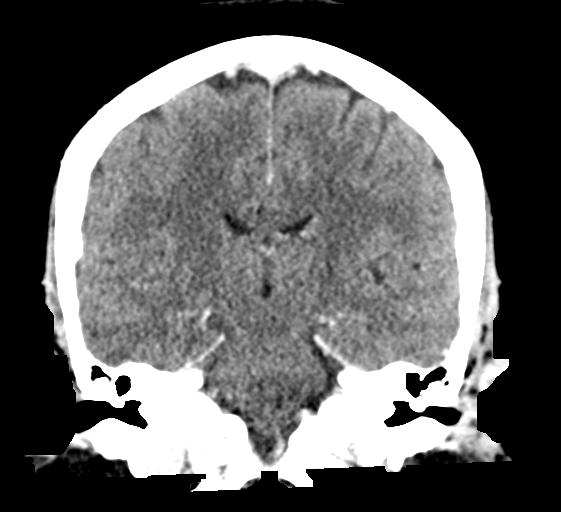

[Series 7: sagittal soft · sagittal · 0.35mm/px · 3 of 63 slices shown]
[im 21/63  brain]
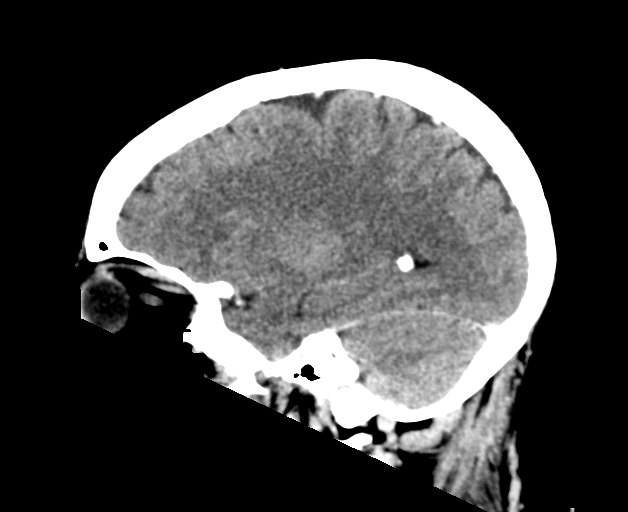
[im 32/63  brain]
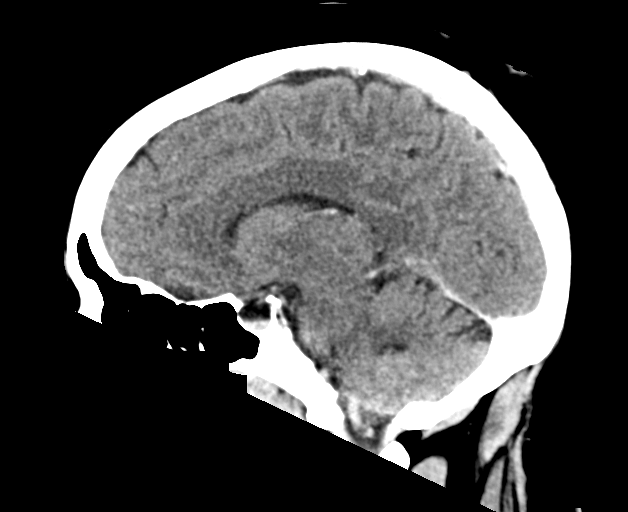
[im 42/63  brain]
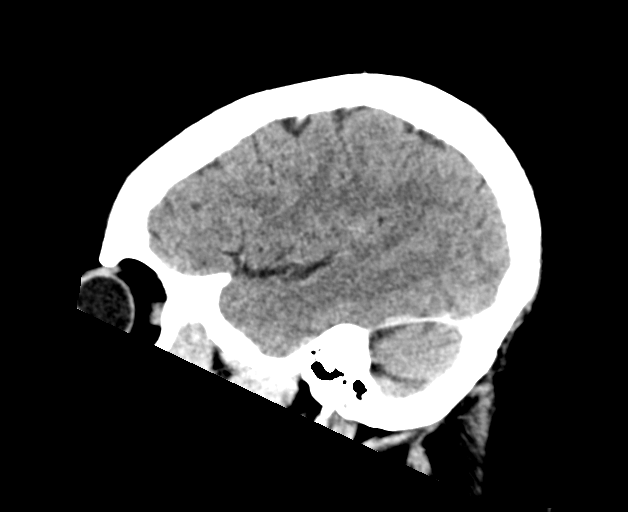

[15 of 47 positions shown; findings below may reference images not displayed]

FINDINGS: Brain: No evidence of acute infarction, hemorrhage, hydrocephalus,
extra-axial collection or mass lesion/mass effect.

Normal enhancement.  No evidence of abscess or edema in the brain.

Vascular: Negative for hyperdense vessel. Normal vascular
enhancement. Normal dural venous enhancement.

Skull: Negative

Sinuses/Orbits: Negative

Other: Diffuse soft tissue swelling of the left ear. Focal rim
enhancing fluid collection within the ear above the external canal
could represent a small abscess measuring approximately 5 mm in
diameter.
IMPRESSION: Normal CT of the brain with contrast. No intracranial abscess or
edema

Diffuse soft tissue swelling of the left ear. 5 mm nonenhancing
focus within the ear above the external canal could represent
inflammation or abscess.

## 2021-09-02 ENCOUNTER — Ambulatory Visit (INDEPENDENT_AMBULATORY_CARE_PROVIDER_SITE_OTHER): Payer: Medicaid Other | Admitting: Internal Medicine

## 2021-09-02 ENCOUNTER — Encounter: Payer: Self-pay | Admitting: Internal Medicine

## 2021-09-02 ENCOUNTER — Other Ambulatory Visit: Payer: Self-pay

## 2021-09-02 VITALS — BP 146/98 | HR 88 | Ht 65.0 in | Wt 224.4 lb

## 2021-09-02 DIAGNOSIS — E010 Iodine-deficiency related diffuse (endemic) goiter: Secondary | ICD-10-CM | POA: Diagnosis not present

## 2021-09-02 DIAGNOSIS — E785 Hyperlipidemia, unspecified: Secondary | ICD-10-CM | POA: Diagnosis not present

## 2021-09-02 DIAGNOSIS — Z794 Long term (current) use of insulin: Secondary | ICD-10-CM

## 2021-09-02 DIAGNOSIS — E1165 Type 2 diabetes mellitus with hyperglycemia: Secondary | ICD-10-CM | POA: Diagnosis not present

## 2021-09-02 DIAGNOSIS — E1142 Type 2 diabetes mellitus with diabetic polyneuropathy: Secondary | ICD-10-CM

## 2021-09-02 LAB — POCT GLYCOSYLATED HEMOGLOBIN (HGB A1C): Hemoglobin A1C: 9 % — AB (ref 4.0–5.6)

## 2021-09-02 LAB — GLUCOSE, POCT (MANUAL RESULT ENTRY): POC Glucose: 223 mg/dl — AB (ref 70–99)

## 2021-09-02 MED ORDER — EMPAGLIFLOZIN 25 MG PO TABS: 25.0000 mg | ORAL_TABLET | Freq: Every day | ORAL | 1 refills | Status: DC

## 2021-09-02 MED ORDER — TOUJEO MAX SOLOSTAR 300 UNIT/ML ~~LOC~~ SOPN: 80.0000 [IU] | PEN_INJECTOR | Freq: Every day | SUBCUTANEOUS | 2 refills | Status: DC

## 2021-09-02 MED ORDER — ATORVASTATIN CALCIUM 80 MG PO TABS: 80.0000 mg | ORAL_TABLET | Freq: Every day | ORAL | 3 refills | Status: DC

## 2021-09-02 MED ORDER — INSULIN PEN NEEDLE 31G X 5 MM MISC: 1.0000 | Freq: Every day | 2 refills | Status: DC

## 2021-09-02 NOTE — Progress Notes (Signed)
Name: Sonya James  MRN/ DOB: [id number], 05-15-1966   Age/ Sex: 55 y.o., female    PCP: Lindell Spar, MD   Reason for Endocrinology Evaluation: Type 2 Diabetes Mellitus     Date of Initial Endocrinology Visit: 09/02/2021     PATIENT IDENTIFIER: Sonya James is a 55 y.o. female with a past medical history of T2DM, HTN, Hx of endometrial cancer and Hx of Pancreatitis and dyslipidemia . The patient presented for initial endocrinology clinic visit on 09/02/2021 for consultative assistance with her diabetes management.    HPI: [name] was    Diagnosed with DM 2014 Prior Medications tried/Intolerance: Has been on jardiance without intolerance issues  Currently checking blood sugars rare x / day, Hypoglycemia episodes : unknown               Hemoglobin A1c has ranged from 5.9% in 2017, peaking at 12.2% in 2022. Patient required assistance for hypoglycemia: no Patient has required hospitalization within the last 1 year from hyper or hypoglycemia: no  In terms of diet, the patient eats 1 meal a day, drinks  half a half tea, drinks cranberry juice     She has dysphagia and thyromegaly . She had an ultrasound in 2005 that did not show any nodules at the time . TFT's normal   HOME DIABETES REGIMEN: Glipizide 5 XL daily  Metformin  1000 mg BID  Novolin Mix 80 units BID    Statin: yes ACE-I/ARB: yes Prior Diabetic Education: yes - does not like going there    METER DOWNLOAD SUMMARY: did not bring    DIABETIC COMPLICATIONS: Microvascular complications:  Neuropathy Denies: CKD, retinopathy  Last eye exam: Completed 2022  Macrovascular complications:   Denies: CAD, CVA      PAST HISTORY: Past Medical History:  Past Medical History:  Diagnosis Date   Anemia    Anxiety    Arthritis    Asthma    Asthma 05/25/2013   Autonomic neuropathy    Cancer of endometrium ([place]) 10/18/2015   Cellulitis, face 05/03/2021   Constipation    COPD (chronic obstructive  pulmonary disease) ([place])    CTS (carpal tunnel syndrome)    Depression    Diabetes mellitus without complication ([place])    Dysphagia 12/14/2016   Encounter for screening colonoscopy    Endometrial cancer ([place]) 2017   Gastritis without bleeding    GERD (gastroesophageal reflux disease)    Gross hematuria 05/11/2019   Headache(784.0)    Heavy menses 10/11/2015   HTN (hypertension)    Hypercholesterolemia    Hypothyroidism    IBS (irritable bowel syndrome)    Osteoarthritis    osteoarthritis   Pancreatitis    per patient    Recurrent boils    buttocks and low back.   S/P laparoscopic hysterectomy 11/04/2015   Shortness of breath    Sleep apnea    CPAP machine   Uncontrolled type 2 diabetes mellitus with diabetic polyneuropathy, with long-term current use of insulin 08/04/2017   Uterine cancer ([place]) 2017   Vitamin D deficiency    Past Surgical History:  Past Surgical History:  Procedure Laterality Date   ABDOMINAL HYSTERECTOMY  2018   endometrial cancer, surgery done at [place]  01/02/2017   Procedure: BIOPSY;  Surgeon: Danie Binder, MD;  [facility];  Service: Endoscopy;;  gastric biopsy   CESAREAN SECTION  1989   COLONOSCOPY WITH PROPOFOL N/A 01/02/2017   Dr.  Fields: redundant colon, two 2-3 mm polyps in sigmoid colon (hyperplastic)   ESOPHAGOGASTRODUODENOSCOPY  05/22/2011   Dr. Oneida Alar: H.pylori gastritis    ESOPHAGOGASTRODUODENOSCOPY (EGD) WITH PROPOFOL N/A 01/02/2017   Dr. 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 s/p dilation, moderate gastritis (negative H.pylori)   FOOT SURGERY Left    tendon repair   IR US GUIDE VASC ACCESS LEFT  01/18/2021   OOPHORECTOMY     OTOPLASATY Left 06/15/2021   Procedure: Excision of left auricular lesion with endaural meatoplasty;  Surgeon: Jason Coop, DO;  Location: [facility];  Service: ENT;  Laterality: Left;   POLYPECTOMY  01/02/2017   Procedure: POLYPECTOMY;  Surgeon: Danie Binder, MD;  [facility];  Service: Endoscopy;;  sigmoid colon polyps times 2   REDUCTION MAMMAPLASTY  1996   SAVORY DILATION N/A 01/02/2017   Procedure: SAVORY DILATION;  Surgeon: Danie Binder, MD;  [facility];  Service: Endoscopy;  Laterality: N/A;   TUBAL LIGATION      Social History:  reports that she has been smoking cigarettes. She has a 22.00 pack-year smoking history. She has never used smokeless tobacco. She reports that she does not drink alcohol and does not use drugs. Family History:  Family History  Problem Relation Age of Onset   Diabetes Mother    Hypertension Mother    COPD Mother    Asthma Mother    Hypercholesterolemia Mother    Hypertension Sister    Hyperlipidemia Sister    Diabetes Sister    Asthma Brother    Alcohol abuse Brother    Asthma Son    Cancer Maternal Grandmother        lung, throat, breast   Alzheimer's disease Maternal Grandmother    Hypertension Maternal Grandmother    Hypercholesterolemia Maternal Grandmother    Heart disease Maternal Grandfather    Stroke Maternal Grandfather    Clotting disorder Maternal Grandfather        blood clots in legs   Hypertension Sister    Hyperlipidemia Sister    Stroke Maternal Aunt    Clotting disorder Maternal Aunt        blood clots in legs   Hypertension Maternal Aunt    Hypercholesterolemia Maternal Aunt    Hypertension Maternal Aunt    Asthma Maternal Aunt    Clotting disorder Maternal Uncle        blood clot -legs traveled to heart and he died of heart attack   Colon cancer Neg Hx      HOME MEDICATIONS: Allergies as of 09/02/2021   No Known Allergies      Medication List        Accurate as of September 02, 2021 10:45 AM. If you have any questions, ask your nurse or doctor.          Accu-Chek Guide Me w/Device Kit 1 Piece by Does not apply route as directed.   Accu-Chek Guide test strip Generic drug: glucose blood Test glucose 4 times a day   Accu-Chek Softclix Lancets  lancets To test glucose 4 times a day   albuterol 108 (90 Base) MCG/ACT inhaler Commonly known as: VENTOLIN HFA Inhale 2 puffs into the lungs every 6 (six) hours as needed for wheezing or shortness of breath.   albuterol (2.5 MG/3ML) 0.083% nebulizer solution Commonly known as: PROVENTIL Take 3 mLs (2.5 mg total) by nebulization every 6 (six) hours as needed for wheezing.   amLODipine 10 MG tablet Commonly known as:  NORVASC Take 1 tablet (10 mg total) by mouth daily.   apixaban 5 MG Tabs tablet Commonly known as: ELIQUIS Take 1 tablet (5 mg total) by mouth 2 (two) times daily. On 08/12/17 @ 9PM, start 1 tab (5 mg) two times daily. What changed: additional instructions   atorvastatin 80 MG tablet Commonly known as: LIPITOR Take 1 tablet by mouth once daily   B-D ULTRAFINE III SHORT PEN 31G X 8 MM Misc Generic drug: Insulin Pen Needle Use to inject insulin 2 times daily.   clotrimazole 2 % vaginal cream Commonly known as: GYNE-LOTRIMIN 3 Place 1 Applicatorful vaginally at bedtime. What changed:  when to take this reasons to take this   cycloSPORINE 0.05 % ophthalmic emulsion Commonly known as: RESTASIS Place 1 drop into both eyes 2 (two) times daily.   DULoxetine 60 MG capsule Commonly known as: CYMBALTA Take 60 mg by mouth daily.   esomeprazole 40 MG capsule Commonly known as: NEXIUM TAKE 1 CAPSULE BY MOUTH TWICE DAILY BEFORE MEAL(S)   furosemide 40 MG tablet Commonly known as: LASIX Take 1 tablet by mouth once daily   glipiZIDE 5 MG 24 hr tablet Commonly known as: GLUCOTROL XL Take 1 tablet by mouth once daily with breakfast   HYDROcodone-acetaminophen 7.5-325 MG tablet Commonly known as: NORCO Take 1 tablet by mouth 3 (three) times daily as needed.   hydrocortisone 2.5 % rectal cream Commonly known as: ANUSOL-HC Place 1 application rectally 2 (two) times daily. As needed for itching What changed:  when to take this reasons to take this additional  instructions   labetalol 100 MG tablet Commonly known as: NORMODYNE Take 1 tablet (100 mg total) by mouth 2 (two) times daily.   linaclotide 290 MCG Caps capsule Commonly known as: Linzess Take 1 capsule (290 mcg total) by mouth daily before breakfast.   linezolid 600 MG tablet Commonly known as: ZYVOX Take 1 tablet (600 mg total) by mouth 2 (two) times daily.   lisinopril 40 MG tablet Commonly known as: ZESTRIL Take 1 tablet by mouth once daily   metFORMIN 1000 MG tablet Commonly known as: GLUCOPHAGE Take 1 tablet (1,000 mg total) by mouth 2 (two) times daily with a meal.   Misc. Devices Misc Blood pressure monitoring device   montelukast 10 MG tablet Commonly known as: SINGULAIR TAKE 1 TABLET BY MOUTH AT BEDTIME   mupirocin ointment 2 % Commonly known as: BACTROBAN SMARTSIG:1 Application Topical 2-3 Times Daily   NovoLIN 70/30 ReliOn (70-30) 100 UNIT/ML injection Generic drug: insulin NPH-regular Human Inject 60 Units into the skin 2 (two) times daily with a meal. What changed: how much to take   ondansetron 4 MG disintegrating tablet Commonly known as: ZOFRAN-ODT Take 4 mg by mouth every 6 (six) hours as needed for nausea or vomiting.   potassium chloride 10 MEQ tablet Commonly known as: KLOR-CON TAKE 1 TABLET BY MOUTH ONCE DAILY IN THE EVENING   rizatriptan 10 MG tablet Commonly known as: MAXALT Take 10 mg by mouth daily as needed for migraine.   topiramate 25 MG tablet Commonly known as: TOPAMAX Take 50 mg by mouth daily.   Vitamin D (Ergocalciferol) 1.25 MG (50000 UNIT) Caps capsule Commonly known as: DRISDOL Take 1 capsule by mouth once a week         ALLERGIES: No Known Allergies   REVIEW OF SYSTEMS: A comprehensive ROS was conducted with the patient and is negative except as per HPI and below:  Review of Systems  Gastrointestinal:  Positive for constipation. Negative for diarrhea, nausea and vomiting.  Musculoskeletal:  Positive for  joint pain.  Neurological:  Positive for tingling.     OBJECTIVE:   VITAL SIGNS: BP (!) 146/98 (BP Location: Left Arm, Patient Position: Sitting, Cuff Size: Small)    Pulse 88    Ht _0  (1.651 m)    Wt 224 lb 6.4 oz (101.8 kg)    LMP 09/20/2015 (Exact Date)    SpO2 99%    BMI 37.34 kg/m    PHYSICAL EXAM:  General: Pt appears well and is in NAD  Neck: General: Supple without adenopathy or carotid bruits. Thyroid: Thyroid size normal.  No goiter or nodules appreciated. No thyroid bruit.  Lungs: Clear with good BS bilat with no rales, rhonchi, or wheezes  Heart: RRR with normal S1 and S2 and no gallops; no murmurs; no rub  Extremities:  Lower extremities - No pretibial edema. No lesions.  Neuro: MS is good with appropriate affect, pt is alert and Ox3     DATA REVIEWED:  Lab Results  Component Value Date   HGBA1C 9.0 (A) 09/02/2021   HGBA1C 10.8 (H) 05/03/2021   HGBA1C 12.2 (H) 11/01/2020    Latest Reference Range & Units Most Recent  TSH 0.450 - 4.500 uIU/mL 1.530 05/03/21 14:46  T4,Free(Direct) 0.61 - 1.12 ng/dL 0.87 04/19/20 11:59      Latest Reference Range & Units 06/15/21 20:00  Sodium 135 - 145 mmol/L 136  Potassium 3.5 - 5.1 mmol/L 3.4 (L)  Chloride 98 - 111 mmol/L 104  CO2 22 - 32 mmol/L 27  Glucose 70 - 99 mg/dL 230 (H)  BUN 6 - 20 mg/dL 11  Creatinine 0.44 - 1.00 mg/dL 0.86  Calcium 8.9 - 10.3 mg/dL 9.7  Anion gap 5 - 15  5    Latest Reference Range & Units 11/01/20 08:45  Cholesterol, Total 100 - 199 mg/dL 206 (H)  HDL Cholesterol >39 mg/dL 42  LDL/HDL Ratio 0.0 - 3.2 ratio 3.2  Triglycerides 0 - 149 mg/dL 155 (H)  VLDL Cholesterol Cal 5 - 40 mg/dL 28  LDL Chol Calc (NIH) 0 - 99 mg/dL 136 (H)       ASSESSMENT / PLAN / RECOMMENDATIONS:   1) Type 2 Diabetes Mellitus, Poorly controlled, With neuropathic  complications - Most recent A1c of 9.0 %. Goal A1c < 7.0 %.    Plan: GENERAL: I have discussed with the patient the pathophysiology of diabetes.  We went over the natural progression of the disease. We talked about both insulin resistance and insulin deficiency. We stressed the importance of lifestyle changes including diet and exercise. I explained the complications associated with diabetes including retinopathy, nephropathy, neuropathy as well as increased risk of cardiovascular disease. We went over the benefit seen with glycemic control.   I explained to the patient that diabetic patients are at higher than normal risk for amputations.  She is NOT a candidate for GLP-1 agonist and DPP-4 inhibitors due to hx of pancreatitis  Will restart Jardiance , she denies  hx of recurrent genital infections  Will stop Glipizide and switch insulin mix to basal for now and consider adding prandial insulin. She does not eat on regular basis but would take insulin mix without eating which increased risk of hypoglycemia especially in the setting of Glipizide intake   MEDICATIONS: Stop Glipizide  - STOP Novolin MIX  - Continue Metformin 1000 mg Twice d ay  - Start Jardiance 25 mg daily  -  Start Toujeo 80 units ONCE daily ( Insulin   EDUCATION / INSTRUCTIONS: BG monitoring instructions: Patient is instructed to check her blood sugars 1 times a day, fasting . Call [facility] if: BG persistently < 70  I reviewed the Rule of 15 for the treatment of hypoglycemia in detail with the patient. Literature supplied.   2) Diabetic complications:  Eye: Does not have known diabetic retinopathy.  Neuro/ Feet: Does have known diabetic peripheral neuropathy. Renal: Patient does not have known baseline CKD. She is not on an ACEI/ARB at present.    3) Dyslipidemia :  - LDL above goal . She is not taking Atorvastatin  - Discussed cardiovascular benefits of statins and encouraged her to restart     Medication  Restart Atorvastatin 80 mg daily    4) Thyromegaly    - Pt  with local neck symptoms  - Will proceed with thyroid ultrasound  -  Biochemically euthyroid      Signed electronically by: Mack Guise, MD  [facility]  [facility] [address] [place], [place] [address] Phone: [phone] FAX: [phone]   CC: Lindell Spar, [place] [address] Phone: [phone]  Fax: [phone]    Return to Endocrinology clinic as below: Future Appointments  Date Time Provider [place]  10/14/2021  9:00 AM Fay Records, MD [facility]  10/31/2021  1:40 PM Lindell Spar, MD [facility]  02/22/2022  1:00 PM [facility] None  03/01/2022 10:30 AM Harriett Rush, PA-C [facility] None

## 2021-09-02 NOTE — Patient Instructions (Addendum)
-   Stop Glipizide  - STOP Novolin MIX  - Continue Metformin 1000 mg Twice d ay  - Start Jardiance 25 mg daily  - Start Toujeo 80 units ONCE daily ( Insulin     HOW TO TREAT LOW BLOOD SUGARS (Blood sugar LESS THAN 70 MG/DL) Please follow the RULE OF 15 for the treatment of hypoglycemia treatment (when your (blood sugars are less than 70 mg/dL)   STEP 1: Take 15 grams of carbohydrates when your blood sugar is low, which includes:  3-4 GLUCOSE TABS  OR 3-4 OZ OF JUICE OR REGULAR SODA OR ONE TUBE OF GLUCOSE GEL    STEP 2: RECHECK blood sugar in 15 MINUTES STEP 3: If your blood sugar is still low at the 15 minute recheck --> then, go back to STEP 1 and treat AGAIN with another 15 grams of carbohydrates.

## 2021-09-06 ENCOUNTER — Other Ambulatory Visit (HOSPITAL_COMMUNITY): Payer: Self-pay

## 2021-09-06 ENCOUNTER — Telehealth: Payer: Self-pay

## 2021-09-06 NOTE — Telephone Encounter (Signed)
Patient Advocate Encounter   Received notification from the patient's pharmacy that prior authorization for [facility] pen injector is required by his/her insurance [employer].   PA submitted on 09/06/21  Key#: [id number]  Status is pending    [facility] will continue to follow:  Patient Advocate Fax:  [phone]

## 2021-09-07 ENCOUNTER — Other Ambulatory Visit (HOSPITAL_COMMUNITY): Payer: Self-pay

## 2021-09-07 NOTE — Telephone Encounter (Signed)
Patient Advocate Encounter  Received notification from [facility] that the request for prior authorization for [facility] pen injector has been denied due to to the patient not having tried Levemir or Lantus.     This determination is currently being appealed. The appeal was faxed to [phone].   This encounter will continue to be updated until final determination.     Specialty Pharmacy Patient Advocate Fax:  [phone]

## 2021-09-13 MED ORDER — INSULIN GLARGINE 100 UNITS/ML SOLOSTAR PEN: 80.0000 [IU] | PEN_INJECTOR | Freq: Every day | SUBCUTANEOUS | 11 refills | Status: DC

## 2021-09-13 MED ORDER — LANTUS SOLOSTAR 100 UNIT/ML ~~LOC~~ SOPN: 80.0000 [IU] | PEN_INJECTOR | Freq: Every day | SUBCUTANEOUS | 11 refills | Status: DC

## 2021-09-13 NOTE — Addendum Note (Signed)
Addended by: Dorita Sciara on: 09/13/2021 08:05 AM   Modules accepted: Orders

## 2021-09-13 NOTE — Addendum Note (Signed)
Addended by: Dorita Sciara on: 09/13/2021 08:02 AM   Modules accepted: Orders

## 2021-09-13 NOTE — Telephone Encounter (Signed)
Vm left for patient to callback to let her know that medication has be switched to Lantus

## 2021-09-19 ENCOUNTER — Ambulatory Visit
Admission: RE | Admit: 2021-09-19 | Discharge: 2021-09-19 | Disposition: A | Discharge status: Home / Self Care | Payer: Medicaid Other | Source: Ambulatory Visit | Attending: Internal Medicine | Admitting: Internal Medicine

## 2021-09-19 DIAGNOSIS — E010 Iodine-deficiency related diffuse (endemic) goiter: Secondary | ICD-10-CM

## 2021-09-19 DIAGNOSIS — E041 Nontoxic single thyroid nodule: Secondary | ICD-10-CM | POA: Diagnosis not present

## 2021-09-21 ENCOUNTER — Encounter: Payer: Self-pay | Admitting: Internal Medicine

## 2021-10-14 ENCOUNTER — Other Ambulatory Visit: Payer: Self-pay

## 2021-10-14 ENCOUNTER — Other Ambulatory Visit: Payer: Self-pay | Admitting: Internal Medicine

## 2021-10-14 ENCOUNTER — Ambulatory Visit (INDEPENDENT_AMBULATORY_CARE_PROVIDER_SITE_OTHER): Payer: Medicaid Other | Admitting: Internal Medicine

## 2021-10-14 ENCOUNTER — Encounter: Payer: Self-pay | Admitting: Internal Medicine

## 2021-10-14 VITALS — BP 160/70 | HR 56 | Ht 65.0 in | Wt 219.4 lb

## 2021-10-14 DIAGNOSIS — I10 Essential (primary) hypertension: Secondary | ICD-10-CM

## 2021-10-14 DIAGNOSIS — R011 Cardiac murmur, unspecified: Secondary | ICD-10-CM

## 2021-10-14 DIAGNOSIS — Z794 Long term (current) use of insulin: Secondary | ICD-10-CM

## 2021-10-14 DIAGNOSIS — E1165 Type 2 diabetes mellitus with hyperglycemia: Secondary | ICD-10-CM | POA: Diagnosis not present

## 2021-10-14 DIAGNOSIS — R0989 Other specified symptoms and signs involving the circulatory and respiratory systems: Secondary | ICD-10-CM | POA: Diagnosis not present

## 2021-10-14 DIAGNOSIS — E785 Hyperlipidemia, unspecified: Secondary | ICD-10-CM | POA: Diagnosis not present

## 2021-10-14 MED ORDER — SPIRONOLACTONE 25 MG PO TABS: 25.0000 mg | ORAL_TABLET | Freq: Every day | ORAL | 3 refills | Status: DC

## 2021-10-14 NOTE — Patient Instructions (Addendum)
Medication Instructions:   Start Spironolactone 25 mg Daily   *If you need a refill on your cardiac medications before your next appointment, please call your pharmacy*   Lab Work: Your physician recommends that you return for lab work in: 1 Week.   If you have labs (blood work) drawn today and your tests are completely normal, you will receive your results only by: [place] (if you have MyChart) OR A paper copy in the mail If you have any lab test that is abnormal or we need to change your treatment, we will call you to review the results.   Testing/Procedures: Your physician has requested that you have an echocardiogram. Echocardiography is a painless test that uses sound waves to create images of your heart. It provides your doctor with information about the size and shape of your heart and how well your hearts chambers and valves are working. This procedure takes approximately one hour. There are no restrictions for this procedure.  Your physician has requested that you have a lower or upper extremity arterial duplex. This test is an ultrasound of the arteries in the legs or arms. It looks at arterial blood flow in the legs and arms. Allow one hour for Lower and Upper Arterial scans. There are no restrictions or special instructions    Follow-Up: At [facility], you and your health needs are our priority.  As part of our continuing mission to provide you with exceptional heart care, we have created designated Provider Care Teams.  These Care Teams include your primary Cardiologist (physician) and Advanced Practice Providers (APPs -  Physician Assistants and Nurse Practitioners) who all work together to provide you with the care you need, when you need it.  We recommend signing up for the patient portal called "MyChart".  Sign up information is provided on this After Visit Summary.  MyChart is used to connect with patients for Virtual Visits (Telemedicine).  Patients are able  to view lab/test results, encounter notes, upcoming appointments, etc.  Non-urgent messages can be sent to your provider as well.   To learn more about what you can do with MyChart, go to [email or web address].    Your next appointment:   1 month(s)  The format for your next appointment:   In Person  Provider:   Dorris Carnes, MD or Bernerd Pho, PA-C    Other Instructions Thank you for choosing [facility]!

## 2021-10-14 NOTE — Progress Notes (Signed)
Cardiology Office Note   Date:  10/14/2021   ID:  Sonya James, DOB 1966/06/27, MRN [id number]  PCP:  Lindell Spar, MD  Cardiologist:   Dorris Carnes, MD   Patient referred for evaluation of murmur     History of Present Illness: Sonya James is a 56 y.o. female with a history of T2DM, HTN, pancreatitis, endometrial CA, HL,  hx gastritis and ? Esophageal web s/p dilation, PE, morbid obesity  The pt says she just found out about murmur    She says she gets SOB with activity, also gets SOB without activity   Has to gasp sometimes    She notes  intermittent chest pains/pressure   Not associated with actvity Occasional dizziness but no syncope        JHas been treated for HTN for over 10 years BP high at home.   DM   Diagnossed in 2016   Was on insulin  Now off.      Diet   In 2013 she weighed 400 lb   Changed diet, Usually eats 1x per day     Lunch   Eggs/bacon   Coffee   Water Cut out sweets    Current Meds  Medication Sig   Accu-Chek Softclix Lancets lancets To test glucose 4 times a day   albuterol (PROVENTIL) (2.5 MG/3ML) 0.083% nebulizer solution Take 3 mLs (2.5 mg total) by nebulization every 6 (six) hours as needed for wheezing.   albuterol (VENTOLIN HFA) 108 (90 Base) MCG/ACT inhaler Inhale 2 puffs into the lungs every 6 (six) hours as needed for wheezing or shortness of breath.   amLODipine (NORVASC) 10 MG tablet Take 1 tablet (10 mg total) by mouth daily.   apixaban (ELIQUIS) 5 MG TABS tablet Take 1 tablet (5 mg total) by mouth 2 (two) times daily. On 08/12/17 @ 9PM, start 1 tab (5 mg) two times daily. (Patient taking differently: Take 5 mg by mouth 2 (two) times daily.)   atorvastatin (LIPITOR) 80 MG tablet Take 1 tablet (80 mg total) by mouth daily.   Blood Glucose Monitoring Suppl (ACCU-CHEK GUIDE ME) w/Device KIT 1 Piece by Does not apply route as directed.   clotrimazole (GYNE-LOTRIMIN 3) 2 % vaginal cream Place 1 Applicatorful vaginally at bedtime. (Patient  taking differently: Place 1 Applicatorful vaginally at bedtime as needed (irritation).)   cycloSPORINE (RESTASIS) 0.05 % ophthalmic emulsion Place 1 drop into both eyes 2 (two) times daily.   DULoxetine (CYMBALTA) 60 MG capsule Take 60 mg by mouth daily.   empagliflozin (JARDIANCE) 25 MG TABS tablet Take 1 tablet (25 mg total) by mouth daily before breakfast.   esomeprazole (NEXIUM) 40 MG capsule TAKE 1 CAPSULE BY MOUTH TWICE DAILY BEFORE MEAL(S)   furosemide (LASIX) 40 MG tablet Take 1 tablet by mouth once daily   glucose blood (ACCU-CHEK GUIDE) test strip Test glucose 4 times a day   HYDROcodone-acetaminophen (NORCO) 7.5-325 MG tablet Take 1 tablet by mouth 3 (three) times daily as needed.   hydrocortisone (ANUSOL-HC) 2.5 % rectal cream Place 1 application rectally 2 (two) times daily. As needed for itching (Patient taking differently: Place 1 application rectally 2 (two) times daily as needed for anal itching.)   insulin glargine (LANTUS SOLOSTAR) 100 UNIT/ML Solostar Pen Inject 80 Units into the skin daily.   Insulin Pen Needle 31G X 5 MM MISC 1 Device by Does not apply route daily in the afternoon.   labetalol (NORMODYNE) 100 MG tablet Take 1  tablet (100 mg total) by mouth 2 (two) times daily.   linaclotide (LINZESS) 290 MCG CAPS capsule Take 1 capsule (290 mcg total) by mouth daily before breakfast.   linezolid (ZYVOX) 600 MG tablet Take 1 tablet (600 mg total) by mouth 2 (two) times daily.   lisinopril (ZESTRIL) 40 MG tablet Take 1 tablet by mouth once daily   metFORMIN (GLUCOPHAGE) 1000 MG tablet Take 1 tablet (1,000 mg total) by mouth 2 (two) times daily with a meal.   Misc. Devices MISC Blood pressure monitoring device   montelukast (SINGULAIR) 10 MG tablet TAKE 1 TABLET BY MOUTH AT BEDTIME   mupirocin ointment (BACTROBAN) 2 % SMARTSIG:1 Application Topical 2-3 Times Daily   ondansetron (ZOFRAN-ODT) 4 MG disintegrating tablet Take 4 mg by mouth every 6 (six) hours as needed for nausea or  vomiting.   potassium chloride (KLOR-CON) 10 MEQ tablet TAKE 1 TABLET BY MOUTH ONCE DAILY IN THE EVENING   rizatriptan (MAXALT) 10 MG tablet Take 10 mg by mouth daily as needed for migraine.   topiramate (TOPAMAX) 25 MG tablet Take 50 mg by mouth daily.   Vitamin D, Ergocalciferol, (DRISDOL) 1.25 MG (50000 UNIT) CAPS capsule Take 1 capsule by mouth once a week     Allergies:   Patient has no known allergies.   Past Medical History:  Diagnosis Date   Anemia    Anxiety    Arthritis    Asthma    Asthma 05/25/2013   Autonomic neuropathy    Cancer of endometrium ([place]) 10/18/2015   Cellulitis, face 05/03/2021   Constipation    COPD (chronic obstructive pulmonary disease) ([place])    CTS (carpal tunnel syndrome)    Depression    Diabetes mellitus without complication ([place])    Dysphagia 12/14/2016   Encounter for screening colonoscopy    Endometrial cancer ([place]) 2017   Gastritis without bleeding    GERD (gastroesophageal reflux disease)    Gross hematuria 05/11/2019   Headache(784.0)    Heavy menses 10/11/2015   HTN (hypertension)    Hypercholesterolemia    Hypothyroidism    IBS (irritable bowel syndrome)    Osteoarthritis    osteoarthritis   Pancreatitis    per patient    Recurrent boils    buttocks and low back.   S/P laparoscopic hysterectomy 11/04/2015   Shortness of breath    Sleep apnea    CPAP machine   Uncontrolled type 2 diabetes mellitus with diabetic polyneuropathy, with long-term current use of insulin 08/04/2017   Uterine cancer ([place]) 2017   Vitamin D deficiency     Past Surgical History:  Procedure Laterality Date   ABDOMINAL HYSTERECTOMY  2018   endometrial cancer, surgery done at [facility]  01/02/2017   Procedure: BIOPSY;  Surgeon: Danie Binder, MD;  Location: [facility];  Service: Endoscopy;;  gastric biopsy   CESAREAN SECTION  1989   COLONOSCOPY WITH PROPOFOL N/A 01/02/2017   Dr. Oneida Alar: redundant colon, two 2-3 mm polyps in sigmoid  colon (hyperplastic)   ESOPHAGOGASTRODUODENOSCOPY  05/22/2011   Dr. Oneida Alar: H.pylori gastritis    ESOPHAGOGASTRODUODENOSCOPY (EGD) WITH PROPOFOL N/A 01/02/2017   Dr. 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 s/p dilation, moderate gastritis (negative H.pylori)   FOOT SURGERY Left    tendon repair   IR US GUIDE VASC ACCESS LEFT  01/18/2021   OOPHORECTOMY     OTOPLASATY Left 06/15/2021   Procedure: Excision of left auricular lesion with endaural meatoplasty;  Surgeon:  Skotnicki, Meghan A, DO;  Location: [facility] OR;  Service: ENT;  Laterality: Left;   POLYPECTOMY  01/02/2017   Procedure: POLYPECTOMY;  Surgeon: Danie Binder, MD;  Location: [facility];  Service: Endoscopy;;  sigmoid colon polyps times 2   REDUCTION MAMMAPLASTY  1996   SAVORY DILATION N/A 01/02/2017   Procedure: SAVORY DILATION;  Surgeon: Danie Binder, MD;  Location: [facility];  Service: Endoscopy;  Laterality: N/A;   TUBAL LIGATION       Social History:  The patient  reports that she has been smoking cigarettes. She has a 22.00 pack-year smoking history. She has never used smokeless tobacco. She reports that she does not drink alcohol and does not use drugs.   Family History:  The patient's family history includes Alcohol abuse in her brother; Alzheimer's disease in her maternal grandmother; Asthma in her brother, maternal aunt, mother, and son; COPD in her mother; Cancer in her maternal grandmother; Clotting disorder in her maternal aunt, maternal grandfather, and maternal uncle; Diabetes in her mother and sister; Heart disease in her maternal grandfather; Hypercholesterolemia in her maternal aunt, maternal grandmother, and mother; Hyperlipidemia in her sister and sister; Hypertension in her maternal aunt, maternal aunt, maternal grandmother, mother, sister, and sister; Stroke in her maternal aunt and maternal grandfather.    ROS:  Please see the history of present illness. All other systems are reviewed and  Negative to the  above problem except as noted.    PHYSICAL EXAM: VS:  BP (!) 160/70    Pulse (!) 56    Ht $R'5\' 5"'kq$  (1.651 m)    Wt 219 lb 6.4 oz (99.5 kg)    LMP 09/20/2015 (Exact Date)    SpO2 99%    BMI 36.51 kg/m    GEN: Obese 56 yo in no acute distress  HEENT: normal  Neck: no JVD, carotid bruits,Thyroid prominent   Cardiac: RRR; II/VI systolic murmur Base  No LE  edema   1+R DP/PT  2+ L DP/PT   Respiratory:  clear to auscultation bilaterally, normal work of breathing GI: soft, nontender, nondistended, + BS  No hepatomegaly  MS: no deformity Moving all extremities   Skin: warm and dry, no rash Neuro:  Strength and sensation are intact Psych: euthymic mood, full affect   EKG:  EKG is ordered today.  SB  56 bpm    LVH with repolarizatoin abnormality     Lipid Panel    Component Value Date/Time   CHOL 206 (H) 11/01/2020 0845   TRIG 155 (H) 11/01/2020 0845   HDL 42 11/01/2020 0845   CHOLHDL 5.3 04/19/2020 1159   VLDL 19 04/19/2020 1159   LDLCALC 136 (H) 11/01/2020 0845   LDLCALC 171 (H) 12/17/2019 0946      Wt Readings from Last 3 Encounters:  10/14/21 219 lb 6.4 oz (99.5 kg)  09/02/21 224 lb 6.4 oz (101.8 kg)  07/28/21 230 lb 0.6 oz (104.3 kg)      ASSESSMENT AND PLAN:  1   Murmur    REview of echo from 2018 patient had a thickened, LV  Hyperdynamic   Most likely murmur is due to turbulence in the LV   She had no valvular problems Recomm a repeat echo to reevaluate LV thickness, LV hemodynamics  Consider MRI I LV shows more hypertrophy. Avoid dehydration.    Will review echo   Evaluate if LV thicker   ? Hypertrophic cardiomyopathy     Consider changing meds (increase neg  inotrope ) FHx   Mother has heart issues Prentiss Bells, 06/03/45)  MI, CVA   No hx of CHF or sudden death  2  Diabetes   Pt is off of insulin   Oral agents adjusted  I congratulated her on the progress she has made with wt loss   Keep on 1 meal per day.  Limit carbs    Check A1 C  3  HTN   BP is high   Review of  labs K is a little low   Would add aldactone 25 mg   Follow up BMET in 10 days   Follow up in clnic in 1 month to review resonse  4  Lipids   Will check lipomed, Lpa ApoB  Current medicines are reviewed at length with the patient today.  The patient does not have concerns regarding medicines.  Signed, Dorris Carnes, MD  10/14/2021 8:54 AM    [facility] [place], [place], [place]  [address] Phone: [phone]; Fax: [phone]

## 2021-10-17 ENCOUNTER — Other Ambulatory Visit: Payer: Self-pay | Admitting: *Deleted

## 2021-10-17 MED ORDER — VITAMIN D (ERGOCALCIFEROL) 1.25 MG (50000 UNIT) PO CAPS: 50000.0000 [IU] | ORAL_CAPSULE | ORAL | 0 refills | Status: DC

## 2021-10-18 ENCOUNTER — Ambulatory Visit: Payer: Medicaid Other | Admitting: Orthopaedic Surgery

## 2021-10-18 ENCOUNTER — Encounter: Payer: Self-pay | Admitting: Orthopaedic Surgery

## 2021-10-18 ENCOUNTER — Other Ambulatory Visit: Payer: Self-pay

## 2021-10-18 ENCOUNTER — Other Ambulatory Visit (HOSPITAL_COMMUNITY)
Admission: RE | Admit: 2021-10-18 | Discharge: 2021-10-18 | Disposition: A | Discharge status: Home / Self Care | Payer: Medicaid Other | Source: Ambulatory Visit | Attending: Internal Medicine | Admitting: Internal Medicine

## 2021-10-18 DIAGNOSIS — G8929 Other chronic pain: Secondary | ICD-10-CM | POA: Diagnosis not present

## 2021-10-18 DIAGNOSIS — M25561 Pain in right knee: Secondary | ICD-10-CM | POA: Diagnosis not present

## 2021-10-18 DIAGNOSIS — M25562 Pain in left knee: Secondary | ICD-10-CM

## 2021-10-18 DIAGNOSIS — E785 Hyperlipidemia, unspecified: Secondary | ICD-10-CM | POA: Insufficient documentation

## 2021-10-18 NOTE — Progress Notes (Signed)
PROCEDURE NOTE:  The patient requests injections of the left knee , verbal consent was obtained.  The left knee was prepped appropriately after time out was performed.   Sterile technique was observed and injection of 1 cc of DepoMedrol 40 mg with several cc's of plain xylocaine. Anesthesia was provided by ethyl chloride and a 20-gauge needle was used to inject the knee area. The injection was tolerated well.  A band aid dressing was applied.  The patient was advised to apply ice later today and tomorrow to the injection sight as needed.  PROCEDURE NOTE:  The patient requests injections of the right knee , verbal consent was obtained.  The right knee was prepped appropriately after time out was performed.   Sterile technique was observed and injection of 1 cc of DepoMedrol 40mg  with several cc's of plain xylocaine. Anesthesia was provided by ethyl chloride and a 20-gauge needle was used to inject the knee area. The injection was tolerated well.  A band aid dressing was applied.  The patient was advised to apply ice later today and tomorrow to the injection sight as needed.  Encounter Diagnosis  Name Primary?   Chronic pain of both knees Yes   Return in six weeks.  She has lost from over 400 pounds to 219.  Keep it up.  Call if any problem.  Precautions discussed.  Electronically Signed Sanjuana Kava, MD 2/7/20239:15 AM

## 2021-10-19 LAB — MISC LABCORP TEST (SEND OUT): Labcorp test code: 167015

## 2021-10-21 ENCOUNTER — Other Ambulatory Visit: Payer: Self-pay

## 2021-10-21 ENCOUNTER — Other Ambulatory Visit (HOSPITAL_COMMUNITY)
Admission: RE | Admit: 2021-10-21 | Discharge: 2021-10-21 | Disposition: A | Discharge status: Home / Self Care | Payer: Medicaid Other | Source: Ambulatory Visit | Attending: Internal Medicine | Admitting: Internal Medicine

## 2021-10-21 ENCOUNTER — Telehealth: Payer: Self-pay

## 2021-10-21 DIAGNOSIS — I10 Essential (primary) hypertension: Secondary | ICD-10-CM | POA: Diagnosis present

## 2021-10-21 DIAGNOSIS — Z794 Long term (current) use of insulin: Secondary | ICD-10-CM | POA: Insufficient documentation

## 2021-10-21 DIAGNOSIS — E1165 Type 2 diabetes mellitus with hyperglycemia: Secondary | ICD-10-CM

## 2021-10-21 DIAGNOSIS — E785 Hyperlipidemia, unspecified: Secondary | ICD-10-CM

## 2021-10-21 LAB — BASIC METABOLIC PANEL
Anion gap: 6 (ref 5–15)
BUN: 18 mg/dL (ref 6–20)
CO2: 26 mmol/L (ref 22–32)
Calcium: 10.2 mg/dL (ref 8.9–10.3)
Chloride: 102 mmol/L (ref 98–111)
Creatinine, Ser: 0.78 mg/dL (ref 0.44–1.00)
GFR, Estimated: >60 mL/min (ref >60)
Glucose, Bld: 191 mg/dL — ABNORMAL HIGH (ref 70–99)
Potassium: 4.2 mmol/L (ref 3.5–5.1)
Sodium: 134 mmol/L — ABNORMAL LOW (ref 135–145)

## 2021-10-21 LAB — HEMOGLOBIN A1C
Hgb A1c MFr Bld: 8.2 % — ABNORMAL HIGH (ref 4.8–5.6)
Mean Plasma Glucose: 188.64 mg/dL

## 2021-10-21 NOTE — Telephone Encounter (Signed)
-----   Message from Dorris Carnes V, MD sent at 10/21/2021  1:52 AM EST ----- ApoB done  128   Work on getting sugars lower  Lpa and lipomed were not done Please have drawn at her convenience

## 2021-10-21 NOTE — Telephone Encounter (Signed)
Pt notified,  orders re-entered for Lpa and lipomed to be done at [facility] out patient lab

## 2021-10-22 LAB — NMR, LIPOPROFILE
Cholesterol, Total: 208 mg/dL — ABNORMAL HIGH (ref 100–199)
HDL Cholesterol by NMR: 39 mg/dL — ABNORMAL LOW (ref >39)
HDL Particle Number: 21.6 umol/L — ABNORMAL LOW (ref >=30.5)
LDL Particle Number: 1876 nmol/L — ABNORMAL HIGH (ref <1000)
LDL Size: 21.2 nm (ref >20.5)
LDL-C (NIH Calc): 144 mg/dL — ABNORMAL HIGH (ref 0–99)
LP-IR Score: 52 — ABNORMAL HIGH (ref <=45)
Small LDL Particle Number: 738 nmol/L — ABNORMAL HIGH (ref <=527)
Triglycerides by NMR: 138 mg/dL (ref 0–149)

## 2021-10-22 LAB — LIPOPROTEIN A (LPA): Lipoprotein (a): 213.9 nmol/L — ABNORMAL HIGH (ref <75.0)

## 2021-10-24 DIAGNOSIS — L720 Epidermal cyst: Secondary | ICD-10-CM | POA: Diagnosis not present

## 2021-10-24 DIAGNOSIS — R1310 Dysphagia, unspecified: Secondary | ICD-10-CM | POA: Diagnosis not present

## 2021-10-24 DIAGNOSIS — F446 Conversion disorder with sensory symptom or deficit: Secondary | ICD-10-CM | POA: Diagnosis not present

## 2021-10-26 ENCOUNTER — Telehealth: Payer: Self-pay

## 2021-10-26 DIAGNOSIS — E1165 Type 2 diabetes mellitus with hyperglycemia: Secondary | ICD-10-CM

## 2021-10-26 DIAGNOSIS — Z794 Long term (current) use of insulin: Secondary | ICD-10-CM

## 2021-10-26 DIAGNOSIS — E785 Hyperlipidemia, unspecified: Secondary | ICD-10-CM

## 2021-10-26 NOTE — Telephone Encounter (Deleted)
-----   Message from Fay Records, MD sent at 10/23/2021  9:56 PM EST ----- Patinet has atherosclerosis noted on CT scan    She is on a statin   Not an adequate response Would recomm Repatha with lipitor   Follow up lipomed in 8 wks with liver panel She is followed in internal medicine  Can she be referred to endocrine for diabetic control (Dr Dorris Fetch)   She had a tremendous weight loss but still needs tighter control of glucose

## 2021-10-26 NOTE — Telephone Encounter (Addendum)
Referral placed to endocrinology-referral cancelled,pt declines Dr.Nida, sees Dr.[name] in March  I have asked Dr.Ross if patient should be referred to lipid clinic, await response. Dr.Ross approved patient for Lipid management clinic     Referral placed to lipid clinic.Patient takes the [place] service to [facility] for appointments. [name]

## 2021-10-27 NOTE — Telephone Encounter (Signed)
-----   Message from Fay Records, MD sent at 10/23/2021  9:56 PM EST ----- Patinet has atherosclerosis noted on CT scan    She is on a statin   Not an adequate response Would recomm Repatha with lipitor   Follow up lipomed in 8 wks with liver panel She is followed in internal medicine  Can she be referred to endocrine for diabetic control (Dr Dorris Fetch)   She had a tremendous weight loss but still needs tighter control of glucose

## 2021-10-31 ENCOUNTER — Ambulatory Visit (INDEPENDENT_AMBULATORY_CARE_PROVIDER_SITE_OTHER): Payer: Medicaid Other | Admitting: Internal Medicine

## 2021-10-31 ENCOUNTER — Other Ambulatory Visit: Payer: Self-pay

## 2021-10-31 ENCOUNTER — Encounter: Payer: Self-pay | Admitting: Internal Medicine

## 2021-10-31 VITALS — BP 124/64 | HR 79 | Resp 18 | Ht 65.0 in | Wt 213.0 lb

## 2021-10-31 DIAGNOSIS — E1165 Type 2 diabetes mellitus with hyperglycemia: Secondary | ICD-10-CM | POA: Diagnosis not present

## 2021-10-31 DIAGNOSIS — Z86711 Personal history of pulmonary embolism: Secondary | ICD-10-CM

## 2021-10-31 DIAGNOSIS — Z794 Long term (current) use of insulin: Secondary | ICD-10-CM | POA: Diagnosis not present

## 2021-10-31 DIAGNOSIS — I10 Essential (primary) hypertension: Secondary | ICD-10-CM

## 2021-10-31 DIAGNOSIS — E782 Mixed hyperlipidemia: Secondary | ICD-10-CM | POA: Diagnosis not present

## 2021-10-31 DIAGNOSIS — J449 Chronic obstructive pulmonary disease, unspecified: Secondary | ICD-10-CM | POA: Diagnosis not present

## 2021-10-31 MED ORDER — LABETALOL HCL 100 MG PO TABS: 100.0000 mg | ORAL_TABLET | Freq: Two times a day (BID) | ORAL | 2 refills | Status: DC

## 2021-10-31 NOTE — Assessment & Plan Note (Signed)
Lipid profile reviewed Last cardiology note reviewed, referred to lipid clinic Currently on statin, plan to start PCSK9 inhibitor therapy 

## 2021-10-31 NOTE — Assessment & Plan Note (Signed)
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 

## 2021-10-31 NOTE — Assessment & Plan Note (Addendum)
BP Readings from Last 1 Encounters:  10/31/21 124/64   Well-controlled with Amlodipine 10 mg QD and lisinopril 40 mg daily On Labetalol 100 mg BID Counseled for compliance with the medications Advised DASH diet and moderate exercise/walking, at least 150 mins/week

## 2021-10-31 NOTE — Assessment & Plan Note (Addendum)
Lab Results  Component Value Date   HGBA1C 8.2 (H) 10/21/2021   On Metformin, Jardiance and Lantus 80 U QD, followed by endocrinology Advised to follow diabetic diet On statin and ACEi Diabetic eye exam: Advised to follow up with Ophthalmology for diabetic eye exam

## 2021-10-31 NOTE — Progress Notes (Signed)
Established Patient Office Visit  Subjective:  Patient ID: Sonya James, female    DOB: 05/01/1966  Age: 56 y.o. MRN: [id number]  CC:  Chief Complaint  Patient presents with   Follow-up    Follow up HTN and DM     HPI Sonya James is a 56 y.o. female with past medical history of HTN, type II DM, HLD, COPD, OSA, PE, GERD, obesity and tobacco abuse who presents for f/u of her chronic medical conditions.  HTN: Her BP is well controlled now.  She has been taking her medications regularly.  She denies any chest pain, dyspnea or palpitations.  Type II DM: She is on Lantus 80 U qHS now.  She is also placed on Jardiance and continues to take metformin currently.  Her Hgb A1c has improved to 8.2 now.  Denies any polyuria or polydipsia currently.  Followed by endocrinology.  She has seen cardiologist for history of systolic murmur, which is deemed to be benign for now.  She is going to follow-up with lipid clinic for uncontrolled HLD despite taking statin.   Past Medical History:  Diagnosis Date   Anemia    Anxiety    Arthritis    Asthma    Asthma 05/25/2013   Autonomic neuropathy    Cancer of endometrium (HCC) 10/18/2015   Cellulitis, face 05/03/2021   Constipation    COPD (chronic obstructive pulmonary disease) (HCC)    CTS (carpal tunnel syndrome)    Depression    Diabetes mellitus without complication (HCC)    Dysphagia 12/14/2016   Encounter for screening colonoscopy    Endometrial cancer (HCC) 2017   Gastritis without bleeding    GERD (gastroesophageal reflux disease)    Gross hematuria 05/11/2019   Headache(784.0)    Heavy menses 10/11/2015   HTN (hypertension)    Hypercholesterolemia    Hypothyroidism    IBS (irritable bowel syndrome)    Osteoarthritis    osteoarthritis   Pancreatitis    per patient    Recurrent boils    buttocks and low back.   S/P laparoscopic hysterectomy 11/04/2015   Shortness of breath    Sleep apnea    CPAP machine   Uncontrolled type  2 diabetes mellitus with diabetic polyneuropathy, with long-term current use of insulin 08/04/2017   Uterine cancer (HCC) 2017   Vitamin D deficiency     Past Surgical History:  Procedure Laterality Date   ABDOMINAL HYSTERECTOMY  2018   endometrial cancer, surgery done at [facility]    BIOPSY  01/02/2017   Procedure: BIOPSY;  Surgeon: West Bali, MD;  Location: [facility];  Service: Endoscopy;;  gastric biopsy   CESAREAN SECTION  1989   COLONOSCOPY WITH PROPOFOL N/A 01/02/2017   Dr. Darrick Penna: redundant colon, two 2-3 mm polyps in sigmoid colon (hyperplastic)   ESOPHAGOGASTRODUODENOSCOPY  05/22/2011   Dr. Darrick Penna: H.pylori gastritis    ESOPHAGOGASTRODUODENOSCOPY (EGD) WITH PROPOFOL N/A 01/02/2017   Dr. Darrick Penna: possible web in proximal esophagus s/p dilation, moderate gastritis (negative H.pylori)   FOOT SURGERY Left    tendon repair   IR US GUIDE VASC ACCESS LEFT  01/18/2021   OOPHORECTOMY     OTOPLASATY Left 06/15/2021   Procedure: Excision of left auricular lesion with endaural meatoplasty;  Surgeon: Laren Boom, DO;  Location: [facility] OR;  Service: ENT;  Laterality: Left;   POLYPECTOMY  01/02/2017   Procedure: POLYPECTOMY;  Surgeon: West Bali, MD;  Location: [facility];  Service:  Endoscopy;;  sigmoid colon polyps times 2   REDUCTION MAMMAPLASTY  1996   SAVORY DILATION N/A 01/02/2017   Procedure: SAVORY DILATION;  Surgeon: Danie Binder, MD;  Location: [facility];  Service: Endoscopy;  Laterality: N/A;   TUBAL LIGATION      Family History  Problem Relation Age of Onset   Diabetes Mother    Hypertension Mother    COPD Mother    Asthma Mother    Hypercholesterolemia Mother    Hypertension Sister    Hyperlipidemia Sister    Diabetes Sister    Asthma Brother    Alcohol abuse Brother    Asthma Son    Cancer Maternal Grandmother        lung, throat, breast   Alzheimer's disease Maternal Grandmother    Hypertension Maternal Grandmother    Hypercholesterolemia  Maternal Grandmother    Heart disease Maternal Grandfather    Stroke Maternal Grandfather    Clotting disorder Maternal Grandfather        blood clots in legs   Hypertension Sister    Hyperlipidemia Sister    Stroke Maternal Aunt    Clotting disorder Maternal Aunt        blood clots in legs   Hypertension Maternal Aunt    Hypercholesterolemia Maternal Aunt    Hypertension Maternal Aunt    Asthma Maternal Aunt    Clotting disorder Maternal Uncle        blood clot -legs traveled to heart and he died of heart attack   Colon cancer Neg Hx     Social History   Socioeconomic History   Marital status: Married    Spouse name: Not on file   Number of children: 1   Years of education: Not on file   Highest education level: Not on file  Occupational History   Not on file  Tobacco Use   Smoking status: Some Days    Packs/day: 1.00    Years: 22.00    Pack years: 22.00    Types: Cigarettes   Smokeless tobacco: Never  Vaping Use   Vaping Use: Never used  Substance and Sexual Activity   Alcohol use: No   Drug use: No   Sexual activity: Yes    Birth control/protection: Surgical    Comment: hyst  Other Topics Concern   Not on file  Social History Narrative   Lives with husband and she has been married to for 9 years.  Recently got evicted and has been living in a motel since last July.   She has 1 son that lives in [place].  Reports one grandbaby      Enjoys watching TV mostly animal [place].      Diet: Eats all food groups   Caffeine: Soda not diet, green tea, coffee about 3 cups of caffeine per day.   Water: Drinks 5-6 8 ounce bottles throughout the day.      Wears a seatbelt.  Currently does not have a vehicle.  Smoke detectors in the facility she is living in.  Does not have any weapons.   Social Determinants of Health   Financial Resource Strain: Not on file  Food Insecurity: Not on file  Transportation Needs: Not on file  Physical Activity: Not on file  Stress:  Not on file  Social Connections: Not on file  Intimate Partner Violence: Not on file    Outpatient Medications Prior to Visit  Medication Sig Dispense Refill   Accu-Chek Softclix Lancets lancets To  test glucose 4 times a day 150 each 2   albuterol (PROVENTIL) (2.5 MG/3ML) 0.083% nebulizer solution Take 3 mLs (2.5 mg total) by nebulization every 6 (six) hours as needed for wheezing. 75 mL 12   albuterol (VENTOLIN HFA) 108 (90 Base) MCG/ACT inhaler Inhale 2 puffs into the lungs every 6 (six) hours as needed for wheezing or shortness of breath. 18 g 5   amLODipine (NORVASC) 10 MG tablet Take 1 tablet (10 mg total) by mouth daily. 90 tablet 1   apixaban (ELIQUIS) 5 MG TABS tablet Take 1 tablet (5 mg total) by mouth 2 (two) times daily. On 08/12/17 @ 9PM, start 1 tab (5 mg) two times daily. (Patient taking differently: Take 5 mg by mouth 2 (two) times daily.) 60 tablet 5   atorvastatin (LIPITOR) 80 MG tablet Take 1 tablet (80 mg total) by mouth daily. 90 tablet 3   Blood Glucose Monitoring Suppl (ACCU-CHEK GUIDE ME) w/Device KIT 1 Piece by Does not apply route as directed. 1 kit 0   clotrimazole (GYNE-LOTRIMIN 3) 2 % vaginal cream Place 1 Applicatorful vaginally at bedtime. (Patient taking differently: Place 1 Applicatorful vaginally at bedtime as needed (irritation).) 21 g 0   cycloSPORINE (RESTASIS) 0.05 % ophthalmic emulsion Place 1 drop into both eyes 2 (two) times daily.     DULoxetine (CYMBALTA) 60 MG capsule Take 60 mg by mouth daily.     empagliflozin (JARDIANCE) 25 MG TABS tablet Take 1 tablet (25 mg total) by mouth daily before breakfast. 90 tablet 1   esomeprazole (NEXIUM) 40 MG capsule TAKE 1 CAPSULE BY MOUTH TWICE DAILY BEFORE MEAL(S) 180 capsule 3   furosemide (LASIX) 40 MG tablet Take 1 tablet by mouth once daily 90 tablet 0   glucose blood (ACCU-CHEK GUIDE) test strip Test glucose 4 times a day 150 each 2   HYDROcodone-acetaminophen (NORCO) 7.5-325 MG tablet Take 1 tablet by mouth 3  (three) times daily as needed.     hydrocortisone (ANUSOL-HC) 2.5 % rectal cream Place 1 application rectally 2 (two) times daily. As needed for itching (Patient taking differently: Place 1 application rectally 2 (two) times daily as needed for anal itching.) 30 g 1   insulin glargine (LANTUS SOLOSTAR) 100 UNIT/ML Solostar Pen Inject 80 Units into the skin daily. 30 mL 11   Insulin Pen Needle 31G X 5 MM MISC 1 Device by Does not apply route daily in the afternoon. 100 each 2   linaclotide (LINZESS) 290 MCG CAPS capsule Take 1 capsule (290 mcg total) by mouth daily before breakfast. 90 capsule 3   lisinopril (ZESTRIL) 40 MG tablet Take 1 tablet by mouth once daily 90 tablet 1   metFORMIN (GLUCOPHAGE) 1000 MG tablet Take 1 tablet (1,000 mg total) by mouth 2 (two) times daily with a meal. 60 tablet 2   Misc. Devices MISC Blood pressure monitoring device 1 each 0   montelukast (SINGULAIR) 10 MG tablet TAKE 1 TABLET BY MOUTH AT BEDTIME 90 tablet 0   mupirocin ointment (BACTROBAN) 2 % SMARTSIG:1 Application Topical 2-3 Times Daily     ondansetron (ZOFRAN-ODT) 4 MG disintegrating tablet Take 4 mg by mouth every 6 (six) hours as needed for nausea or vomiting.     potassium chloride (KLOR-CON) 10 MEQ tablet TAKE 1 TABLET BY MOUTH ONCE DAILY IN THE EVENING 90 tablet 0   rizatriptan (MAXALT) 10 MG tablet Take 10 mg by mouth daily as needed for migraine.     spironolactone (ALDACTONE) 25 MG tablet  Take 1 tablet (25 mg total) by mouth daily. 90 tablet 3   topiramate (TOPAMAX) 25 MG tablet Take 50 mg by mouth daily.     Vitamin D, Ergocalciferol, (DRISDOL) 1.25 MG (50000 UNIT) CAPS capsule Take 1 capsule (50,000 Units total) by mouth once a week. 8 capsule 0   labetalol (NORMODYNE) 100 MG tablet Take 1 tablet (100 mg total) by mouth 2 (two) times daily. 60 tablet 2   linezolid (ZYVOX) 600 MG tablet Take 1 tablet (600 mg total) by mouth 2 (two) times daily. 14 tablet 0   No facility-administered medications  prior to visit.    No Known Allergies  ROS Review of Systems  Constitutional:  Negative for chills and fever.  HENT:  Positive for ear pain (Right ear). Negative for congestion, sinus pressure, sinus pain and sore throat.   Eyes:  Negative for pain and discharge.  Respiratory:  Negative for cough and shortness of breath.   Cardiovascular:  Negative for chest pain and palpitations.  Gastrointestinal:  Negative for abdominal pain, diarrhea, nausea and vomiting.  Endocrine: Negative for polydipsia and polyuria.       Hot flashes  Genitourinary:  Negative for dysuria and hematuria.  Musculoskeletal:  Positive for back pain. Negative for neck pain and neck stiffness.  Skin:  Negative for rash.  Neurological:  Negative for dizziness and weakness.  Psychiatric/Behavioral:  Negative for agitation and behavioral problems.      Objective:    Physical Exam Vitals reviewed.  Constitutional:      General: She is not in acute distress.    Appearance: She is obese. She is not diaphoretic.  HENT:     Head: Normocephalic and atraumatic.     Nose: Nose normal. No congestion.     Mouth/Throat:     Mouth: Mucous membranes are moist.     Pharynx: No posterior oropharyngeal erythema.  Eyes:     General: No scleral icterus.    Extraocular Movements: Extraocular movements intact.  Cardiovascular:     Rate and Rhythm: Normal rate and regular rhythm.     Pulses: Normal pulses.     Heart sounds: Murmur (Systolic over upper sternal border) heard.  Pulmonary:     Breath sounds: Normal breath sounds. No wheezing or rales.  Abdominal:     Palpations: Abdomen is soft.     Tenderness: There is no abdominal tenderness.  Musculoskeletal:     Cervical back: Neck supple. No tenderness.     Right lower leg: No edema.     Left lower leg: No edema.  Skin:    General: Skin is warm.     Findings: No rash.  Neurological:     General: No focal deficit present.     Mental Status: She is alert and oriented  to person, place, and time.  Psychiatric:        Mood and Affect: Mood normal.        Behavior: Behavior normal.    BP 124/64 (BP Location: Right Arm, Patient Position: Sitting, Cuff Size: Normal)    Pulse 79    Resp 18    Ht $R'5\' 5"'EO$  (1.651 m)    Wt 213 lb (96.6 kg)    LMP 09/20/2015 (Exact Date)    SpO2 97%    BMI 35.45 kg/m  Wt Readings from Last 3 Encounters:  10/31/21 213 lb (96.6 kg)  10/14/21 219 lb 6.4 oz (99.5 kg)  09/02/21 224 lb 6.4 oz (101.8 kg)  Lab Results  Component Value Date   TSH 1.530 05/03/2021   Lab Results  Component Value Date   WBC 7.3 06/09/2021   HGB 13.4 06/09/2021   HCT 41.5 06/09/2021   MCV 93.7 06/09/2021   PLT 351 06/09/2021   Lab Results  Component Value Date   NA 134 (L) 10/21/2021   K 4.2 10/21/2021   CO2 26 10/21/2021   GLUCOSE 191 (H) 10/21/2021   BUN 18 10/21/2021   CREATININE 0.78 10/21/2021   BILITOT 0.8 06/09/2021   ALKPHOS 100 06/09/2021   AST 13 (L) 06/09/2021   ALT 11 06/09/2021   PROT 6.8 06/09/2021   ALBUMIN 3.5 06/09/2021   CALCIUM 10.2 10/21/2021   ANIONGAP 6 10/21/2021   Lab Results  Component Value Date   CHOL 206 (H) 11/01/2020   Lab Results  Component Value Date   HDL 39 (L) 10/21/2021   Lab Results  Component Value Date   LDLCALC 136 (H) 11/01/2020   Lab Results  Component Value Date   TRIG 138 10/21/2021   Lab Results  Component Value Date   CHOLHDL 5.3 04/19/2020   Lab Results  Component Value Date   HGBA1C 8.2 (H) 10/21/2021      Assessment & Plan:   Problem List Items Addressed This Visit       Cardiovascular and Mediastinum   Essential hypertension, benign - Primary    BP Readings from Last 1 Encounters:  10/31/21 124/64  Well-improved with Amlodipine 10 mg QD and lisinopril 40 mg daily On Labetalol 100 mg BID Counseled for compliance with the medications Advised DASH diet and moderate exercise/walking, at least 150 mins/week      Relevant Medications   labetalol (NORMODYNE)  100 MG tablet     Respiratory   Chronic obstructive pulmonary disease ([place])    Well-controlled with Albuterol inhaler PRN        Endocrine   Uncontrolled type 2 diabetes mellitus with hyperglycemia, with long-term current use of insulin (HCC)    Lab Results  Component Value Date   HGBA1C 8.2 (H) 10/21/2021  On Metformin, Jardiance and Lantus 80 U QD, followed by endocrinology Advised to follow diabetic diet On statin and ACEi Diabetic eye exam: Advised to follow up with Ophthalmology for diabetic eye exam         Other   History of pulmonary embolism    Unprovoked On Eliquis 5 mg BID Followed by heme-onc.      Mixed hyperlipidemia    Lipid profile reviewed Last cardiology note reviewed, referred to lipid clinic Currently on statin, plan to start PCSK9 inhibitor therapy      Relevant Medications   labetalol (NORMODYNE) 100 MG tablet    Meds ordered this encounter  Medications   labetalol (NORMODYNE) 100 MG tablet    Sig: Take 1 tablet (100 mg total) by mouth 2 (two) times daily.    Dispense:  60 tablet    Refill:  2    Follow-up: Return in about 4 months (around 02/28/2022) for Annual physical.    Lindell Spar, MD

## 2021-10-31 NOTE — Assessment & Plan Note (Signed)
Well-controlled with Albuterol inhaler PRN

## 2021-10-31 NOTE — Patient Instructions (Signed)
Please continue taking medications as prescribed.  Please continue to follow low carb diet and perform moderate exercise/walking at least 150 mins/week. 

## 2021-11-02 ENCOUNTER — Telehealth: Payer: Self-pay | Admitting: *Deleted

## 2021-11-02 NOTE — Patient Outreach (Signed)
Care Coordination  11/02/2021  Sonya James 1966/03/22 [id number]  Sonya James was referred to the [facility] Managed Care High Risk team for assistance with care coordination and care management services. Care coordination/care management services as part of the Medicaid benefit was offered to the patient today. The patient declined assistance offered today.   Plan: The Medicaid Managed Care High Risk team is available at any time in the future to assist with care coordination/care management services upon referral.   Lurena Joiner RN, BSN [place] RN Care Coordinator

## 2021-11-02 NOTE — Patient Instructions (Signed)
Sonya James,  Thank you for taking time to speak with me today about care coordination and care management services available to you at no cost as part of your Medicaid benefit. These services are voluntary. Our team is available to provide assistance regarding your health care needs at any time. Please do not hesitate to reach out to me if we can be of service to you at any time in the future. [place], BSN [place] RN Care Coordinator

## 2021-11-03 ENCOUNTER — Telehealth: Payer: Self-pay

## 2021-11-03 NOTE — Telephone Encounter (Signed)
FMLA forms  Copied Noted Sleeved  Patient is aware Dr Posey Pronto may or may not fill out these forms.

## 2021-11-04 ENCOUNTER — Ambulatory Visit (INDEPENDENT_AMBULATORY_CARE_PROVIDER_SITE_OTHER): Payer: Medicaid Other

## 2021-11-04 ENCOUNTER — Other Ambulatory Visit: Payer: Self-pay

## 2021-11-04 DIAGNOSIS — R0989 Other specified symptoms and signs involving the circulatory and respiratory systems: Secondary | ICD-10-CM

## 2021-11-07 DIAGNOSIS — M7752 Other enthesopathy of left foot: Secondary | ICD-10-CM | POA: Diagnosis not present

## 2021-11-07 DIAGNOSIS — M7751 Other enthesopathy of right foot: Secondary | ICD-10-CM | POA: Diagnosis not present

## 2021-11-07 DIAGNOSIS — M7989 Other specified soft tissue disorders: Secondary | ICD-10-CM | POA: Diagnosis not present

## 2021-11-07 NOTE — Progress Notes (Signed)
Cardiology Office Note   Date:  11/10/2021   ID:  Sonya James, DOB 05/20/1966, MRN [id number]  PCP:  Lindell Spar, MD  Cardiologist:   Dorris Carnes, MD   Patient referred for evaluation of murmur     History of Present Illness: Sonya James is a 56 y.o. female with a history of T2DM, HTN, pancreatitis, endometrial CA, HL,  hx gastritis and ? Esophageal web s/p dilation, PE, morbid obesity I saw the pt in Feb   She was referred for dyspnea, both with and without activity   Also complained of intermittent chest pressure not assoicated with acitvity   Occasional dizziness    ON exam the pt had a systolic murmur     I have reviewed echo from 11/08/21   Difficult windows   LV appeared severerly thickened, particularly at the apex.   Concerning that the apex was hypokinetic  Since seen the pt says she feels aobut the same     Current Meds  Medication Sig   Accu-Chek Softclix Lancets lancets To test glucose 4 times a day   albuterol (PROVENTIL) (2.5 MG/3ML) 0.083% nebulizer solution Take 3 mLs (2.5 mg total) by nebulization every 6 (six) hours as needed for wheezing.   albuterol (VENTOLIN HFA) 108 (90 Base) MCG/ACT inhaler Inhale 2 puffs into the lungs every 6 (six) hours as needed for wheezing or shortness of breath.   amLODipine (NORVASC) 10 MG tablet Take 1 tablet (10 mg total) by mouth daily.   apixaban (ELIQUIS) 5 MG TABS tablet Take 1 tablet (5 mg total) by mouth 2 (two) times daily. On 08/12/17 @ 9PM, start 1 tab (5 mg) two times daily. (Patient taking differently: Take 5 mg by mouth 2 (two) times daily.)   atorvastatin (LIPITOR) 80 MG tablet Take 1 tablet (80 mg total) by mouth daily.   Blood Glucose Monitoring Suppl (ACCU-CHEK GUIDE ME) w/Device KIT 1 Piece by Does not apply route as directed.   clotrimazole (GYNE-LOTRIMIN 3) 2 % vaginal cream Place 1 Applicatorful vaginally at bedtime. (Patient taking differently: Place 1 Applicatorful vaginally at bedtime as needed (irritation).)    cycloSPORINE (RESTASIS) 0.05 % ophthalmic emulsion Place 1 drop into both eyes 2 (two) times daily.   DULoxetine (CYMBALTA) 60 MG capsule Take 60 mg by mouth daily.   empagliflozin (JARDIANCE) 25 MG TABS tablet Take 1 tablet (25 mg total) by mouth daily before breakfast.   esomeprazole (NEXIUM) 40 MG capsule TAKE 1 CAPSULE BY MOUTH TWICE DAILY BEFORE MEAL(S)   furosemide (LASIX) 40 MG tablet Take 1 tablet by mouth once daily   glucose blood (ACCU-CHEK GUIDE) test strip Test glucose 4 times a day   HYDROcodone-acetaminophen (NORCO) 7.5-325 MG tablet Take 1 tablet by mouth 3 (three) times daily as needed.   hydrocortisone (ANUSOL-HC) 2.5 % rectal cream Place 1 application rectally 2 (two) times daily. As needed for itching (Patient taking differently: Place 1 application rectally 2 (two) times daily as needed for anal itching.)   insulin glargine (LANTUS SOLOSTAR) 100 UNIT/ML Solostar Pen Inject 80 Units into the skin daily.   Insulin Pen Needle 31G X 5 MM MISC 1 Device by Does not apply route daily in the afternoon.   labetalol (NORMODYNE) 100 MG tablet Take 1 tablet (100 mg total) by mouth 2 (two) times daily.   linaclotide (LINZESS) 290 MCG CAPS capsule Take 1 capsule (290 mcg total) by mouth daily before breakfast.   lisinopril (ZESTRIL) 40 MG tablet  Take 1 tablet by mouth once daily   metFORMIN (GLUCOPHAGE) 1000 MG tablet Take 1 tablet (1,000 mg total) by mouth 2 (two) times daily with a meal.   Misc. Devices MISC Blood pressure monitoring device   montelukast (SINGULAIR) 10 MG tablet TAKE 1 TABLET BY MOUTH AT BEDTIME   mupirocin ointment (BACTROBAN) 2 % SMARTSIG:1 Application Topical 2-3 Times Daily   ondansetron (ZOFRAN-ODT) 4 MG disintegrating tablet Take 4 mg by mouth every 6 (six) hours as needed for nausea or vomiting.   potassium chloride (KLOR-CON) 10 MEQ tablet TAKE 1 TABLET BY MOUTH ONCE DAILY IN THE EVENING   rizatriptan (MAXALT) 10 MG tablet Take 10 mg by mouth daily as needed  for migraine.   spironolactone (ALDACTONE) 25 MG tablet Take 1 tablet (25 mg total) by mouth daily.   topiramate (TOPAMAX) 25 MG tablet Take 50 mg by mouth daily.   Vitamin D, Ergocalciferol, (DRISDOL) 1.25 MG (50000 UNIT) CAPS capsule Take 1 capsule (50,000 Units total) by mouth once a week.     Allergies:   Patient has no known allergies.   Past Medical History:  Diagnosis Date   Anemia    Anxiety    Arthritis    Asthma    Asthma 05/25/2013   Autonomic neuropathy    Cancer of endometrium ([place]) 10/18/2015   Cellulitis, face 05/03/2021   Constipation    COPD (chronic obstructive pulmonary disease) ([place])    CTS (carpal tunnel syndrome)    Depression    Diabetes mellitus without complication ([place])    Dysphagia 12/14/2016   Encounter for screening colonoscopy    Endometrial cancer ([place]) 2017   Gastritis without bleeding    GERD (gastroesophageal reflux disease)    Gross hematuria 05/11/2019   Headache(784.0)    Heavy menses 10/11/2015   HTN (hypertension)    Hypercholesterolemia    Hypothyroidism    IBS (irritable bowel syndrome)    Osteoarthritis    osteoarthritis   Pancreatitis    per patient    Recurrent boils    buttocks and low back.   S/P laparoscopic hysterectomy 11/04/2015   Shortness of breath    Sleep apnea    CPAP machine   Uncontrolled type 2 diabetes mellitus with diabetic polyneuropathy, with long-term current use of insulin 08/04/2017   Uterine cancer ([place]) 2017   Vitamin D deficiency     Past Surgical History:  Procedure Laterality Date   ABDOMINAL HYSTERECTOMY  2018   endometrial cancer, surgery done at [facility]  01/02/2017   Procedure: BIOPSY;  Surgeon: Danie Binder, MD;  [facility];  Service: Endoscopy;;  gastric biopsy   CESAREAN SECTION  1989   COLONOSCOPY WITH PROPOFOL N/A 01/02/2017   Dr. Oneida Alar: redundant colon, two 2-3 mm polyps in sigmoid colon (hyperplastic)   ESOPHAGOGASTRODUODENOSCOPY  05/22/2011   Dr.  Oneida Alar: H.pylori gastritis    ESOPHAGOGASTRODUODENOSCOPY (EGD) WITH PROPOFOL N/A 01/02/2017   Dr. 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 s/p dilation, moderate gastritis (negative H.pylori)   FOOT SURGERY Left    tendon repair   IR US GUIDE VASC ACCESS LEFT  01/18/2021   OOPHORECTOMY     OTOPLASATY Left 06/15/2021   Procedure: Excision of left auricular lesion with endaural meatoplasty;  Surgeon: Jason Coop, DO;  Location: [facility];  Service: ENT;  Laterality: Left;   POLYPECTOMY  01/02/2017   Procedure: POLYPECTOMY;  Surgeon: Danie Binder, MD;  [facility];  Service:  Endoscopy;;  sigmoid colon polyps times 2   REDUCTION MAMMAPLASTY  1996   SAVORY DILATION N/A 01/02/2017   Procedure: SAVORY DILATION;  Surgeon: Danie Binder, MD;  [facility];  Service: Endoscopy;  Laterality: N/A;   TUBAL LIGATION       Social History:  The patient  reports that she has been smoking cigarettes. She has a 22.00 pack-year smoking history. She has never used smokeless tobacco. She reports that she does not drink alcohol and does not use drugs.   Family History:  The patient's family history includes Alcohol abuse in her brother; Alzheimer's disease in her maternal grandmother; Asthma in her brother, maternal aunt, mother, and son; COPD in her mother; Cancer in her maternal grandmother; Clotting disorder in her maternal aunt, maternal grandfather, and maternal uncle; Diabetes in her mother and sister; Heart disease in her maternal grandfather; Hypercholesterolemia in her maternal aunt, maternal grandmother, and mother; Hyperlipidemia in her sister and sister; Hypertension in her maternal aunt, maternal aunt, maternal grandmother, mother, sister, and sister; Stroke in her maternal aunt and maternal grandfather.    ROS:  Please see the history of present illness. All other systems are reviewed and  Negative to the above problem except as noted.    PHYSICAL EXAM: VS:  BP 110/60     Pulse 66    Ht 5' 5" (1.651 m)    Wt 212 lb 3.2 oz (96.3 kg)    LMP 09/20/2015 (Exact Date)    SpO2 96%    BMI 35.31 kg/m    GEN: Obese 56 yo in no acute distress  HEENT: normal  Neck: no JVD, carotid bruits,Thyroid prominent   Cardiac: RRR;  No murmrs   No LE  edema   1+R DP/PT  2+ L DP/PT   Respiratory:  clear to auscultation bilaterally GI: soft, nontender, nondistended, + BS  No hepatomegaly  MS: no deformity Moving all extremities   Skin: warm and dry, no rash Neuro:  Strength and sensation are intact Psych: euthymic mood, full affect   EKG:  EKG is not ordered today.   Echo  11/08/21  2040 PAULA V ROSS IMPRESSIONS Left ventricular ejection fraction, by estimation, is 65 to 70%. The left ventricle has normal function. Left ventricular endocardial border not optimally defined to evaluate regional wall motion. There is moderate left ventricular hypertrophy. Left ventricular diastolic parameters are consistent with Grade I diastolic dysfunction (impaired relaxation). 1. Right ventricular systolic function is normal. The right ventricular size is normal. Tricuspid regurgitation signal is inadequate for assessing PA pressure. 2. The mitral valve is normal in structure. No evidence of mitral valve regurgitation. No evidence of mitral stenosis. 3. The aortic valve is tricuspid. There is mild calcification of the aortic valve. There is mild thickening of the aortic valve. Aortic valve regurgitation is not visualized. No aortic stenosis is present. 4. The inferior vena cava is normal in size with greater than 50% respiratory variability, suggesting right atrial pressure of 3 mmHg. Lipid Panel    Component Value Date/Time   CHOL 206 (H) 11/01/2020 0845   TRIG 138 10/21/2021 1052   HDL 39 (L) 10/21/2021 1052   CHOLHDL 5.3 04/19/2020 1159   VLDL 19 04/19/2020 1159   LDLCALC 136 (H) 11/01/2020 0845   LDLCALC 171 (H) 12/17/2019 0946      Wt Readings from Last 3 Encounters:   11/10/21 212 lb 3.2 oz (96.3 kg)  10/31/21 213 lb (96.6 kg)  10/14/21 219 lb 6.4  oz (99.5 kg)      ASSESSMENT AND PLAN:  1   Murmur   I have reviewed echo with the pt  Difficlut images   Significant LVEF    Would recomm MRI to evaluate for HCM       2  Diabetes  Again congratulated patient on the improvements she has made with wt loss  Last A1C 8.2  Again,discussed diet  3  HTN   BP is better on current regimen    4  Lipids   Lpa was 214  LDL 144   LDL particl 1876    She has atherosclerosis on CT   Need to nconfirm meds   5  Hx PE   Remains on Eliquis     6  GI   Hx gastritis   s/p dilation for possible web    Current medicines are reviewed at length with the patient today.  The patient does not have concerns regarding medicines.  Signed, Dorris Carnes, MD  11/10/2021 3:46 PM    [facility] [place], [place], [place]  [address] Phone: [phone]; Fax: [phone]      Cardiology Office Note   Date:  11/10/2021   ID:  Sonya James, DOB 10-Aug-1966, MRN [id number]  PCP:  Lindell Spar, MD  Cardiologist:   Dorris Carnes, MD   Patient referred for evaluation of murmur     History of Present Illness: Sonya James is a 56 y.o. female with a history of T2DM, HTN, pancreatitis, endometrial CA, HL,  hx gastritis and ? Esophageal web s/p dilation, PE, morbid obesity  The pt says she just found out about murmur    She says she gets SOB with activity, also gets SOB without activity   Has to gasp sometimes    She notes  intermittent chest pains/pressure   Not associated with actvity Occasional dizziness but no syncope        JHas been treated for HTN for over 10 years BP high at home.   DM   Diagnossed in 2016   Was on insulin  Now off.      Diet   In 2013 she weighed 400 lb   Changed diet, Usually eats 1x per day     Lunch   Eggs/bacon   Coffee   Water Cut out sweets  I saw the pt in clinic on 10/14/21   Current Meds  Medication Sig    Accu-Chek Softclix Lancets lancets To test glucose 4 times a day   albuterol (PROVENTIL) (2.5 MG/3ML) 0.083% nebulizer solution Take 3 mLs (2.5 mg total) by nebulization every 6 (six) hours as needed for wheezing.   albuterol (VENTOLIN HFA) 108 (90 Base) MCG/ACT inhaler Inhale 2 puffs into the lungs every 6 (six) hours as needed for wheezing or shortness of breath.   amLODipine (NORVASC) 10 MG tablet Take 1 tablet (10 mg total) by mouth daily.   apixaban (ELIQUIS) 5 MG TABS tablet Take 1 tablet (5 mg total) by mouth 2 (two) times daily. On 08/12/17 @ 9PM, start 1 tab (5 mg) two times daily. (Patient taking differently: Take 5 mg by mouth 2 (two) times daily.)   atorvastatin (LIPITOR) 80 MG tablet Take 1 tablet (80 mg total) by mouth daily.   Blood Glucose Monitoring Suppl (ACCU-CHEK GUIDE ME) w/Device KIT 1 Piece by Does not apply route as directed.   clotrimazole (GYNE-LOTRIMIN 3) 2 % vaginal cream Place 1 Applicatorful  vaginally at bedtime. (Patient taking differently: Place 1 Applicatorful vaginally at bedtime as needed (irritation).)   cycloSPORINE (RESTASIS) 0.05 % ophthalmic emulsion Place 1 drop into both eyes 2 (two) times daily.   DULoxetine (CYMBALTA) 60 MG capsule Take 60 mg by mouth daily.   empagliflozin (JARDIANCE) 25 MG TABS tablet Take 1 tablet (25 mg total) by mouth daily before breakfast.   esomeprazole (NEXIUM) 40 MG capsule TAKE 1 CAPSULE BY MOUTH TWICE DAILY BEFORE MEAL(S)   furosemide (LASIX) 40 MG tablet Take 1 tablet by mouth once daily   glucose blood (ACCU-CHEK GUIDE) test strip Test glucose 4 times a day   HYDROcodone-acetaminophen (NORCO) 7.5-325 MG tablet Take 1 tablet by mouth 3 (three) times daily as needed.   hydrocortisone (ANUSOL-HC) 2.5 % rectal cream Place 1 application rectally 2 (two) times daily. As needed for itching (Patient taking differently: Place 1 application rectally 2 (two) times daily as needed for anal itching.)   insulin glargine (LANTUS SOLOSTAR)  100 UNIT/ML Solostar Pen Inject 80 Units into the skin daily.   Insulin Pen Needle 31G X 5 MM MISC 1 Device by Does not apply route daily in the afternoon.   labetalol (NORMODYNE) 100 MG tablet Take 1 tablet (100 mg total) by mouth 2 (two) times daily.   linaclotide (LINZESS) 290 MCG CAPS capsule Take 1 capsule (290 mcg total) by mouth daily before breakfast.   lisinopril (ZESTRIL) 40 MG tablet Take 1 tablet by mouth once daily   metFORMIN (GLUCOPHAGE) 1000 MG tablet Take 1 tablet (1,000 mg total) by mouth 2 (two) times daily with a meal.   Misc. Devices MISC Blood pressure monitoring device   montelukast (SINGULAIR) 10 MG tablet TAKE 1 TABLET BY MOUTH AT BEDTIME   mupirocin ointment (BACTROBAN) 2 % SMARTSIG:1 Application Topical 2-3 Times Daily   ondansetron (ZOFRAN-ODT) 4 MG disintegrating tablet Take 4 mg by mouth every 6 (six) hours as needed for nausea or vomiting.   potassium chloride (KLOR-CON) 10 MEQ tablet TAKE 1 TABLET BY MOUTH ONCE DAILY IN THE EVENING   rizatriptan (MAXALT) 10 MG tablet Take 10 mg by mouth daily as needed for migraine.   spironolactone (ALDACTONE) 25 MG tablet Take 1 tablet (25 mg total) by mouth daily.   topiramate (TOPAMAX) 25 MG tablet Take 50 mg by mouth daily.   Vitamin D, Ergocalciferol, (DRISDOL) 1.25 MG (50000 UNIT) CAPS capsule Take 1 capsule (50,000 Units total) by mouth once a week.     Allergies:   Patient has no known allergies.   Past Medical History:  Diagnosis Date   Anemia    Anxiety    Arthritis    Asthma    Asthma 05/25/2013   Autonomic neuropathy    Cancer of endometrium ([place]) 10/18/2015   Cellulitis, face 05/03/2021   Constipation    COPD (chronic obstructive pulmonary disease) ([place])    CTS (carpal tunnel syndrome)    Depression    Diabetes mellitus without complication ([place])    Dysphagia 12/14/2016   Encounter for screening colonoscopy    Endometrial cancer ([place]) 2017   Gastritis without bleeding    GERD (gastroesophageal reflux  disease)    Gross hematuria 05/11/2019   Headache(784.0)    Heavy menses 10/11/2015   HTN (hypertension)    Hypercholesterolemia    Hypothyroidism    IBS (irritable bowel syndrome)    Osteoarthritis    osteoarthritis   Pancreatitis    per patient    Recurrent boils    buttocks  and low back.   S/P laparoscopic hysterectomy 11/04/2015   Shortness of breath    Sleep apnea    CPAP machine   Uncontrolled type 2 diabetes mellitus with diabetic polyneuropathy, with long-term current use of insulin 08/04/2017   Uterine cancer ([place]) 2017   Vitamin D deficiency     Past Surgical History:  Procedure Laterality Date   ABDOMINAL HYSTERECTOMY  2018   endometrial cancer, surgery done at [facility]  01/02/2017   Procedure: BIOPSY;  Surgeon: Danie Binder, MD;  [facility];  Service: Endoscopy;;  gastric biopsy   CESAREAN SECTION  1989   COLONOSCOPY WITH PROPOFOL N/A 01/02/2017   Dr. Oneida Alar: redundant colon, two 2-3 mm polyps in sigmoid colon (hyperplastic)   ESOPHAGOGASTRODUODENOSCOPY  05/22/2011   Dr. Oneida Alar: H.pylori gastritis    ESOPHAGOGASTRODUODENOSCOPY (EGD) WITH PROPOFOL N/A 01/02/2017   Dr. 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 s/p dilation, moderate gastritis (negative H.pylori)   FOOT SURGERY Left    tendon repair   IR US GUIDE VASC ACCESS LEFT  01/18/2021   OOPHORECTOMY     OTOPLASATY Left 06/15/2021   Procedure: Excision of left auricular lesion with endaural meatoplasty;  Surgeon: Jason Coop, DO;  Location: [facility];  Service: ENT;  Laterality: Left;   POLYPECTOMY  01/02/2017   Procedure: POLYPECTOMY;  Surgeon: Danie Binder, MD;  [facility];  Service: Endoscopy;;  sigmoid colon polyps times 2   REDUCTION MAMMAPLASTY  1996   SAVORY DILATION N/A 01/02/2017   Procedure: SAVORY DILATION;  Surgeon: Danie Binder, MD;  [facility];  Service: Endoscopy;  Laterality: N/A;   TUBAL LIGATION       Social History:  The patient   reports that she has been smoking cigarettes. She has a 22.00 pack-year smoking history. She has never used smokeless tobacco. She reports that she does not drink alcohol and does not use drugs.   Family History:  The patient's family history includes Alcohol abuse in her brother; Alzheimer's disease in her maternal grandmother; Asthma in her brother, maternal aunt, mother, and son; COPD in her mother; Cancer in her maternal grandmother; Clotting disorder in her maternal aunt, maternal grandfather, and maternal uncle; Diabetes in her mother and sister; Heart disease in her maternal grandfather; Hypercholesterolemia in her maternal aunt, maternal grandmother, and mother; Hyperlipidemia in her sister and sister; Hypertension in her maternal aunt, maternal aunt, maternal grandmother, mother, sister, and sister; Stroke in her maternal aunt and maternal grandfather.    ROS:  Please see the history of present illness. All other systems are reviewed and  Negative to the above problem except as noted.    PHYSICAL EXAM: VS:  BP 110/60    Pulse 66    Ht 5' 5" (1.651 m)    Wt 212 lb 3.2 oz (96.3 kg)    LMP 09/20/2015 (Exact Date)    SpO2 96%    BMI 35.31 kg/m    GEN: Obese 56 yo in no acute distress  HEENT: normal  Neck: no JVD, carotid bruits,Thyroid prominent   Cardiac: RRR; II/VI systolic murmur Base  No LE  edema   1+R DP/PT  2+ L DP/PT   Respiratory:  clear to auscultation bilaterally, normal work of breathing GI: soft, nontender, nondistended, + BS  No hepatomegaly  MS: no deformity Moving all extremities   Skin: warm and dry, no rash Neuro:  Strength and sensation are intact Psych: euthymic mood,  full affect   EKG:  EKG is ordered today.  SB  56 bpm    LVH with repolarizatoin abnormality     Lipid Panel    Component Value Date/Time   CHOL 206 (H) 11/01/2020 0845   TRIG 138 10/21/2021 1052   HDL 39 (L) 10/21/2021 1052   CHOLHDL 5.3 04/19/2020 1159   VLDL 19 04/19/2020 1159   LDLCALC  136 (H) 11/01/2020 0845   LDLCALC 171 (H) 12/17/2019 0946      Wt Readings from Last 3 Encounters:  11/10/21 212 lb 3.2 oz (96.3 kg)  10/31/21 213 lb (96.6 kg)  10/14/21 219 lb 6.4 oz (99.5 kg)      ASSESSMENT AND PLAN:  1   Murmur    REview of echo from 2018 patient had a thickened, LV  Hyperdynamic   Most likely murmur is due to turbulence in the LV   She had no valvular problems Recomm a repeat echo to reevaluate LV thickness, LV hemodynamics  Consider MRI I LV shows more hypertrophy. Avoid dehydration.    Will review echo   Evaluate if LV thicker   ? Hypertrophic cardiomyopathy     Consider changing meds (increase neg inotrope ) FHx   Mother has heart issues Prentiss Bells, 06/03/45)  MI, CVA   No hx of CHF or sudden death  2  Diabetes   Pt is off of insulin   Oral agents adjusted  I congratulated her on the progress she has made with wt loss   Keep on 1 meal per day.  Limit carbs    Check A1 C  3  HTN   BP is high   Review of labs K is a little low   Would add aldactone 25 mg   Follow up BMET in 10 days   Follow up in clnic in 1 month to review resonse  4  Lipids   Will check lipomed, Lpa ApoB  Current medicines are reviewed at length with the patient today.  The patient does not have concerns regarding medicines.  Signed, Dorris Carnes, MD  11/10/2021 3:46 PM    [facility] [place], [place], [place]  [address] Phone: [phone]; Fax: [phone]

## 2021-11-08 ENCOUNTER — Other Ambulatory Visit: Payer: Self-pay

## 2021-11-08 ENCOUNTER — Ambulatory Visit (HOSPITAL_COMMUNITY)
Admission: RE | Admit: 2021-11-08 | Discharge: 2021-11-08 | Disposition: A | Discharge status: Home / Self Care | Payer: Medicaid Other | Source: Ambulatory Visit | Attending: Internal Medicine | Admitting: Internal Medicine

## 2021-11-08 DIAGNOSIS — Z0279 Encounter for issue of other medical certificate: Secondary | ICD-10-CM

## 2021-11-08 DIAGNOSIS — R011 Cardiac murmur, unspecified: Secondary | ICD-10-CM | POA: Insufficient documentation

## 2021-11-08 LAB — ECHOCARDIOGRAM COMPLETE
AR max vel: 2.27 cm2
AV Area VTI: 2.38 cm2
AV Area mean vel: 2.54 cm2
AV Mean grad: 7 mmHg
AV Peak grad: 17 mmHg
Ao pk vel: 2.06 m/s
Area-P 1/2: 2.83 cm2
S' Lateral: 2.5 cm

## 2021-11-08 NOTE — Progress Notes (Signed)
*  PRELIMINARY RESULTS* Echocardiogram 2D Echocardiogram has been performed.  Sonya James 11/08/2021, 9:23 AM

## 2021-11-08 NOTE — Telephone Encounter (Signed)
Called patient, will pick up the forms.

## 2021-11-10 ENCOUNTER — Ambulatory Visit (INDEPENDENT_AMBULATORY_CARE_PROVIDER_SITE_OTHER): Payer: Medicaid Other | Admitting: Internal Medicine

## 2021-11-10 ENCOUNTER — Other Ambulatory Visit: Payer: Self-pay

## 2021-11-10 ENCOUNTER — Encounter: Payer: Self-pay | Admitting: Internal Medicine

## 2021-11-10 VITALS — BP 110/60 | HR 66 | Ht 65.0 in | Wt 212.2 lb

## 2021-11-10 DIAGNOSIS — I421 Obstructive hypertrophic cardiomyopathy: Secondary | ICD-10-CM | POA: Diagnosis not present

## 2021-11-10 DIAGNOSIS — E785 Hyperlipidemia, unspecified: Secondary | ICD-10-CM | POA: Diagnosis not present

## 2021-11-10 NOTE — Patient Instructions (Signed)
Medication Instructions:  ?Your physician recommends that you continue on your current medications as directed. Please refer to the Current Medication list given to you today. ? ?*If you need a refill on your cardiac medications before your next appointment, please call your pharmacy* ? ? ?Lab Work: ?NONE  ? ?If you have labs (blood work) drawn today and your tests are completely normal, you will receive your results only by: ?MyChart Message (if you have MyChart) OR ?A paper copy in the mail ?If you have any lab test that is abnormal or we need to change your treatment, we will call you to review the results. ? ? ?Testing/Procedures: ? ?Your provider would like to have a Cardiac MRI  ? ? ?Follow-Up: ?At [facility], you and your health needs are our priority.  As part of our continuing mission to provide you with exceptional heart care, we have created designated Provider Care Teams.  These Care Teams include your primary Cardiologist (physician) and Advanced Practice Providers (APPs -  Physician Assistants and Nurse Practitioners) who all work together to provide you with the care you need, when you need it. ? ?We recommend signing up for the patient portal called "MyChart".  Sign up information is provided on this After Visit Summary.  MyChart is used to connect with patients for Virtual Visits (Telemedicine).  Patients are able to view lab/test results, encounter notes, upcoming appointments, etc.  Non-urgent messages can be sent to your provider as well.   ?To learn more about what you can do with MyChart, go to [email or web address].   ? ?Your next appointment:   ? Pending Test Results  ? ?The format for your next appointment:   ?In Person ? ?Provider:   ?Dorris Carnes, MD  ? ? ?Other Instructions ?Thank you for choosing [facility]! ? ?  ?

## 2021-11-14 ENCOUNTER — Telehealth: Payer: Self-pay | Admitting: Internal Medicine

## 2021-11-14 ENCOUNTER — Other Ambulatory Visit (HOSPITAL_COMMUNITY): Payer: Self-pay | Admitting: Otolaryngology

## 2021-11-14 ENCOUNTER — Other Ambulatory Visit: Payer: Self-pay | Admitting: Otolaryngology

## 2021-11-14 DIAGNOSIS — R1310 Dysphagia, unspecified: Secondary | ICD-10-CM

## 2021-11-14 NOTE — Telephone Encounter (Signed)
PERCERT FOR FOLLOWING:  ? ?MR CARD MORPHOLOGY WO/W CM  ? ?12/09/2021 @ [facility]  ?

## 2021-11-15 NOTE — Addendum Note (Signed)
Addended by: Levonne Hubert on: 11/15/2021 10:40 AM ? ? Modules accepted: Orders ? ?

## 2021-11-21 ENCOUNTER — Ambulatory Visit (INDEPENDENT_AMBULATORY_CARE_PROVIDER_SITE_OTHER): Payer: Medicaid Other | Admitting: Internal Medicine

## 2021-11-21 ENCOUNTER — Encounter: Payer: Self-pay | Admitting: Internal Medicine

## 2021-11-21 ENCOUNTER — Other Ambulatory Visit: Payer: Self-pay

## 2021-11-21 VITALS — BP 124/80 | HR 68 | Ht 65.0 in | Wt 213.6 lb

## 2021-11-21 DIAGNOSIS — E1165 Type 2 diabetes mellitus with hyperglycemia: Secondary | ICD-10-CM | POA: Diagnosis not present

## 2021-11-21 DIAGNOSIS — E1142 Type 2 diabetes mellitus with diabetic polyneuropathy: Secondary | ICD-10-CM | POA: Diagnosis not present

## 2021-11-21 DIAGNOSIS — E010 Iodine-deficiency related diffuse (endemic) goiter: Secondary | ICD-10-CM

## 2021-11-21 DIAGNOSIS — Z794 Long term (current) use of insulin: Secondary | ICD-10-CM

## 2021-11-21 LAB — POCT GLUCOSE (DEVICE FOR HOME USE): Glucose Fasting, POC: 201 mg/dL — AB (ref 70–99)

## 2021-11-21 MED ORDER — LEVEMIR FLEXPEN 100 UNIT/ML ~~LOC~~ SOPN: 30.0000 [IU] | PEN_INJECTOR | Freq: Every day | SUBCUTANEOUS | 3 refills | Status: DC

## 2021-11-21 NOTE — Progress Notes (Unsigned)
Name: Sonya James  MRN/ DOB: [id number], 1965-12-23   Age/ Sex: 56 y.o., female    PCP: Lindell Spar, MD   Reason for Endocrinology Evaluation: Type 2 Diabetes Mellitus     Date of Initial Endocrinology Visit: 09/02/2022    PATIENT IDENTIFIER: Sonya James is a 56 y.o. female with a past medical history of T2DM, HTN, Hx of endometrial cancer and Hx of Pancreatitis and dyslipidemia . The patient presented for initial endocrinology clinic visit on 09/02/2021  for consultative assistance with her diabetes management.    HPI: [name] was    Diagnosed with DM 2014 Prior Medications tried/Intolerance: Has been on jardiance without intolerance issues           Hemoglobin A1c has ranged from 5.9% in 2017, peaking at 12.2% in 2022.  She has dysphagia and thyromegaly . She had an ultrasound in 2005 that did not show any nodules at the time . TFT's normal    On her initial visit to our clinic she had an A1c 9.0 % , She was on Glipizide, Novolin Mix , and Metformin . We stopped Glipizide, Novolin Mix, started basal insulin, Jardiance and continued Metformin      SUBJECTIVE:   During the last visit (09/02/2021): A1c 9.0 % We stopped Glipizide, Novolin Mix, started basal insulin, Jardiance and continued Metformin      Today (11/21/21): [name] is here for a follow up on diabetes management.   She checks her  blood sugars 2 times daily. The patient has not had hypoglycemic episodes since the last clinic visit.    She is not on insulin     HOME DIABETES REGIMEN: Metformin  1000 mg BID  Jardiance 25 mg daily  Lantus 80 units once daily - not taking     Statin: yes ACE-I/ARB: yes Prior Diabetic Education: yes - does not like going there    [employer]: did not bring    DIABETIC COMPLICATIONS: Microvascular complications:  Neuropathy Denies: CKD, retinopathy  Last eye exam: Completed 2022  Macrovascular complications:   Denies: CAD,  CVA      PAST HISTORY: Past Medical History:  Past Medical History:  Diagnosis Date   Anemia    Anxiety    Arthritis    Asthma    Asthma 05/25/2013   Autonomic neuropathy    Cancer of endometrium ([place]) 10/18/2015   Cellulitis, face 05/03/2021   Constipation    COPD (chronic obstructive pulmonary disease) ([place])    CTS (carpal tunnel syndrome)    Depression    Diabetes mellitus without complication ([place])    Dysphagia 12/14/2016   Encounter for screening colonoscopy    Endometrial cancer ([place]) 2017   Gastritis without bleeding    GERD (gastroesophageal reflux disease)    Gross hematuria 05/11/2019   Headache(784.0)    Heavy menses 10/11/2015   HTN (hypertension)    Hypercholesterolemia    Hypothyroidism    IBS (irritable bowel syndrome)    Osteoarthritis    osteoarthritis   Pancreatitis    per patient    Recurrent boils    buttocks and low back.   S/P laparoscopic hysterectomy 11/04/2015   Shortness of breath    Sleep apnea    CPAP machine   Uncontrolled type 2 diabetes mellitus with diabetic polyneuropathy, with long-term current use of insulin 08/04/2017   Uterine cancer ([place]) 2017   Vitamin D deficiency    Past Surgical History:  Past Surgical History:  Procedure  Laterality Date   ABDOMINAL HYSTERECTOMY  2018   endometrial cancer, surgery done at [facility]  01/02/2017   Procedure: BIOPSY;  Surgeon: Danie Binder, MD;  [facility];  Service: Endoscopy;;  gastric biopsy   CESAREAN SECTION  1989   COLONOSCOPY WITH PROPOFOL N/A 01/02/2017   Dr. Oneida Alar: redundant colon, two 2-3 mm polyps in sigmoid colon (hyperplastic)   ESOPHAGOGASTRODUODENOSCOPY  05/22/2011   Dr. Oneida Alar: H.pylori gastritis    ESOPHAGOGASTRODUODENOSCOPY (EGD) WITH PROPOFOL N/A 01/02/2017   Dr. 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 s/p dilation, moderate gastritis (negative H.pylori)   FOOT SURGERY Left    tendon repair   IR US GUIDE VASC ACCESS LEFT  01/18/2021    OOPHORECTOMY     OTOPLASATY Left 06/15/2021   Procedure: Excision of left auricular lesion with endaural meatoplasty;  Surgeon: Jason Coop, DO;  Location: [facility];  Service: ENT;  Laterality: Left;   POLYPECTOMY  01/02/2017   Procedure: POLYPECTOMY;  Surgeon: Danie Binder, MD;  [facility];  Service: Endoscopy;;  sigmoid colon polyps times 2   REDUCTION MAMMAPLASTY  1996   SAVORY DILATION N/A 01/02/2017   Procedure: SAVORY DILATION;  Surgeon: Danie Binder, MD;  [facility];  Service: Endoscopy;  Laterality: N/A;   TUBAL LIGATION      Social History:  reports that she has been smoking cigarettes. She has a 22.00 pack-year smoking history. She has never used smokeless tobacco. She reports that she does not drink alcohol and does not use drugs. Family History:  Family History  Problem Relation Age of Onset   Diabetes Mother    Hypertension Mother    COPD Mother    Asthma Mother    Hypercholesterolemia Mother    Hypertension Sister    Hyperlipidemia Sister    Diabetes Sister    Asthma Brother    Alcohol abuse Brother    Asthma Son    Cancer Maternal Grandmother        lung, throat, breast   Alzheimer's disease Maternal Grandmother    Hypertension Maternal Grandmother    Hypercholesterolemia Maternal Grandmother    Heart disease Maternal Grandfather    Stroke Maternal Grandfather    Clotting disorder Maternal Grandfather        blood clots in legs   Hypertension Sister    Hyperlipidemia Sister    Stroke Maternal Aunt    Clotting disorder Maternal Aunt        blood clots in legs   Hypertension Maternal Aunt    Hypercholesterolemia Maternal Aunt    Hypertension Maternal Aunt    Asthma Maternal Aunt    Clotting disorder Maternal Uncle        blood clot -legs traveled to heart and he died of heart attack   Colon cancer Neg Hx      HOME MEDICATIONS: Allergies as of 11/21/2021   No Known Allergies      Medication List        Accurate as  of November 21, 2021  3:37 PM. If you have any questions, ask your nurse or doctor.          STOP taking these medications    Lantus SoloStar 100 UNIT/ML Solostar Pen Generic drug: insulin glargine Stopped by: Dorita Sciara, MD       TAKE these medications    Accu-Chek Guide Me w/Device Kit 1 Piece by Does not apply route as directed.  Accu-Chek Guide test strip Generic drug: glucose blood Test glucose 4 times a day   Accu-Chek Softclix Lancets lancets To test glucose 4 times a day   albuterol 108 (90 Base) MCG/ACT inhaler Commonly known as: VENTOLIN HFA Inhale 2 puffs into the lungs every 6 (six) hours as needed for wheezing or shortness of breath.   albuterol (2.5 MG/3ML) 0.083% nebulizer solution Commonly known as: PROVENTIL Take 3 mLs (2.5 mg total) by nebulization every 6 (six) hours as needed for wheezing.   amLODipine 10 MG tablet Commonly known as: NORVASC Take 1 tablet (10 mg total) by mouth daily.   apixaban 5 MG Tabs tablet Commonly known as: ELIQUIS Take 1 tablet (5 mg total) by mouth 2 (two) times daily. On 08/12/17 @ 9PM, start 1 tab (5 mg) two times daily. What changed: additional instructions   atorvastatin 80 MG tablet Commonly known as: LIPITOR Take 1 tablet (80 mg total) by mouth daily.   clotrimazole 2 % vaginal cream Commonly known as: GYNE-LOTRIMIN 3 Place 1 Applicatorful vaginally at bedtime. What changed:  when to take this reasons to take this   cycloSPORINE 0.05 % ophthalmic emulsion Commonly known as: RESTASIS Place 1 drop into both eyes 2 (two) times daily.   DULoxetine 60 MG capsule Commonly known as: CYMBALTA Take 60 mg by mouth daily.   empagliflozin 25 MG Tabs tablet Commonly known as: Jardiance Take 1 tablet (25 mg total) by mouth daily before breakfast.   esomeprazole 40 MG capsule Commonly known as: NEXIUM TAKE 1 CAPSULE BY MOUTH TWICE DAILY BEFORE MEAL(S)   furosemide 40 MG tablet Commonly known as:  LASIX Take 1 tablet by mouth once daily   HYDROcodone-acetaminophen 7.5-325 MG tablet Commonly known as: NORCO Take 1 tablet by mouth 3 (three) times daily as needed.   hydrocortisone 2.5 % rectal cream Commonly known as: ANUSOL-HC Place 1 application rectally 2 (two) times daily. As needed for itching What changed:  when to take this reasons to take this additional instructions   Insulin Pen Needle 31G X 5 MM Misc 1 Device by Does not apply route daily in the afternoon.   labetalol 100 MG tablet Commonly known as: NORMODYNE Take 1 tablet (100 mg total) by mouth 2 (two) times daily.   Levemir FlexPen 100 UNIT/ML FlexPen Generic drug: insulin detemir Inject 30 Units into the skin daily. Started by: Dorita Sciara, MD   linaclotide 290 MCG Caps capsule Commonly known as: Linzess Take 1 capsule (290 mcg total) by mouth daily before breakfast.   lisinopril 40 MG tablet Commonly known as: ZESTRIL Take 1 tablet by mouth once daily   metFORMIN 1000 MG tablet Commonly known as: GLUCOPHAGE Take 1 tablet (1,000 mg total) by mouth 2 (two) times daily with a meal.   Misc. Devices Misc Blood pressure monitoring device   montelukast 10 MG tablet Commonly known as: SINGULAIR TAKE 1 TABLET BY MOUTH AT BEDTIME   mupirocin ointment 2 % Commonly known as: BACTROBAN SMARTSIG:1 Application Topical 2-3 Times Daily   ondansetron 4 MG disintegrating tablet Commonly known as: ZOFRAN-ODT Take 4 mg by mouth every 6 (six) hours as needed for nausea or vomiting.   potassium chloride 10 MEQ tablet Commonly known as: KLOR-CON TAKE 1 TABLET BY MOUTH ONCE DAILY IN THE EVENING   rizatriptan 10 MG tablet Commonly known as: MAXALT Take 10 mg by mouth daily as needed for migraine.   spironolactone 25 MG tablet Commonly known as: ALDACTONE Take 1 tablet (25 mg  total) by mouth daily.   topiramate 25 MG tablet Commonly known as: TOPAMAX Take 50 mg by mouth daily.   Vitamin D  (Ergocalciferol) 1.25 MG (50000 UNIT) Caps capsule Commonly known as: DRISDOL Take 1 capsule (50,000 Units total) by mouth once a week.         ALLERGIES: No Known Allergies      OBJECTIVE:   VITAL SIGNS: BP 124/80 (BP Location: Left Arm, Patient Position: Sitting, Cuff Size: Large)    Pulse 68    Ht _0  (1.651 m)    Wt 213 lb 9.6 oz (96.9 kg)    LMP 09/20/2015 (Exact Date)    SpO2 98%    BMI 35.54 kg/m    PHYSICAL EXAM:  General: Pt appears well and is in NAD  Neck: General: Supple without adenopathy or carotid bruits. Thyroid: Thyroid size normal.  No goiter or nodules appreciated. No thyroid bruit.  Lungs: Clear with good BS bilat with no rales, rhonchi, or wheezes  Heart: RRR   Extremities:  Lower extremities - No pretibial edema. No lesions.  Neuro: MS is good with appropriate affect, pt is alert and Ox3     DATA REVIEWED:  Lab Results  Component Value Date   HGBA1C 8.2 (H) 10/21/2021   HGBA1C 9.0 (A) 09/02/2021   HGBA1C 10.8 (H) 05/03/2021    Latest Reference Range & Units 10/21/21 10:52 10/21/21 10:53  Sodium 135 - 145 mmol/L 134 (L)   Potassium 3.5 - 5.1 mmol/L 4.2   Chloride 98 - 111 mmol/L 102   CO2 22 - 32 mmol/L 26   Glucose 70 - 99 mg/dL 191 (H)   Mean Plasma Glucose mg/dL  188.64  BUN 6 - 20 mg/dL 18   Creatinine 0.44 - 1.00 mg/dL 0.78   Calcium 8.9 - 10.3 mg/dL 10.2   Anion gap 5 - 15  6     Thyroid Ultrasound 09/19/2021  Estimated total number of nodules >/= 1 cm: 0   Number of spongiform nodules >/=  2 cm not described below (TR1): 0   Number of mixed cystic and solid nodules >/= 1.5 cm not described below (TR2): 0   _________________________________________________________   Few scattered less than 5 mm nodules do not meet criteria for imaging surveillance.   IMPRESSION: No significant sonographic abnormality of the thyroid.     ASSESSMENT / PLAN / RECOMMENDATIONS:   1) Type 2 Diabetes Mellitus, Poorly controlled, With  neuropathic  complications - Most recent A1c of 8.2 %. Goal A1c < 7.0 %.     - A1c trending down  She is NOT a candidate for GLP-1 agonist and DPP-4 inhibitors due to hx of pancreatitis  Will restart Jardiance , she denies  hx of recurrent genital infections  Will stop Glipizide and switch insulin mix to basal for now and consider adding prandial insulin. She does not eat on regular basis but would take insulin mix without eating which increased risk of hypoglycemia especially in the setting of Glipizide intake   MEDICATIONS: - Continue Metformin 1000 mg Twice d ay  - Start Jardiance 25 mg daily  - Start Toujeo 80 units ONCE daily ( Insulin   EDUCATION / INSTRUCTIONS: BG monitoring instructions: Patient is instructed to check her blood sugars 1 times a day, fasting . Call [facility] if: BG persistently < 70  I reviewed the Rule of 15 for the treatment of hypoglycemia in detail with the patient. Literature supplied.   2) Diabetic complications:  Eye: Does not have known diabetic retinopathy.  Neuro/ Feet: Does have known diabetic peripheral neuropathy. Renal: Patient does not have known baseline CKD. She is not on an ACEI/ARB at present.     3) Thyromegaly    - Pt  with local neck symptoms  - Thyroid ultrasound did not reveal any nodules.         Signed electronically by: Mack Guise, MD  [facility]  [facility] [address] [place], [place] [address] Phone: [phone] FAX: [phone]   CC: Lindell Spar, MD [address] [place] [address] Phone: [phone]  Fax: [phone]    Return to Endocrinology clinic as below: Future Appointments  Date Time Provider [place]  11/23/2021 10:30 AM AP-DG 3 ([place]) AP-DG [place] [facility]  11/28/2021  2:30 PM CVD-NLINE PHARMACIST [facility]  12/06/2021  1:40 PM Sanjuana Kava, MD OCR-OCR None  12/09/2021  8:00 AM MC-MR 1 MC-MRI  [facility]  02/14/2022  2:30 PM Annitta Needs, NP RGA-RGA [place]  02/22/2022  1:00 PM AP-ACAPA LAB AP-ACAPA None  03/01/2022 10:30 AM Harriett Rush, PA-C AP-ACAPA None  03/06/2022  1:00 PM Lindell Spar, MD RPC-RPC RPC

## 2021-11-21 NOTE — Patient Instructions (Addendum)
-   Continue Metformin 1000 mg Twice d ay  ?- Continue Jardiance 25 mg daily  ?- Start Levemir 30 units daily  ? ? ? ?HOW TO TREAT LOW BLOOD SUGARS (Blood sugar LESS THAN 70 MG/DL) ?Please follow the RULE OF 15 for the treatment of hypoglycemia treatment (when your (blood sugars are less than 70 mg/dL)  ? ?STEP 1: Take 15 grams of carbohydrates when your blood sugar is low, which includes:  ?3-4 GLUCOSE TABS  OR ?3-4 OZ OF JUICE OR REGULAR SODA OR ?ONE TUBE OF GLUCOSE GEL   ? ?STEP 2: RECHECK blood sugar in 15 MINUTES ?STEP 3: If your blood sugar is still low at the 15 minute recheck --> then, go back to STEP 1 and treat AGAIN with another 15 grams of carbohydrates. ? ?

## 2021-11-22 DIAGNOSIS — M7989 Other specified soft tissue disorders: Secondary | ICD-10-CM | POA: Diagnosis not present

## 2021-11-23 ENCOUNTER — Ambulatory Visit (HOSPITAL_COMMUNITY)
Admission: RE | Admit: 2021-11-23 | Discharge: 2021-11-23 | Disposition: A | Discharge status: Home / Self Care | Payer: Medicaid Other | Source: Ambulatory Visit | Attending: Otolaryngology | Admitting: Otolaryngology

## 2021-11-23 ENCOUNTER — Other Ambulatory Visit: Payer: Self-pay

## 2021-11-23 DIAGNOSIS — R1310 Dysphagia, unspecified: Secondary | ICD-10-CM | POA: Diagnosis not present

## 2021-11-24 ENCOUNTER — Other Ambulatory Visit: Payer: Self-pay | Admitting: Otolaryngology

## 2021-11-24 ENCOUNTER — Telehealth: Payer: Self-pay | Admitting: Internal Medicine

## 2021-11-24 NOTE — Telephone Encounter (Signed)
Hi Dr. Harrington Challenger, ? ?Ms. Glauser has an excision of a right external ear lesion planned for next week on 11/29/2021. You recently saw her on 11/10/2021 at which time patient continued to report dyspnea both at rest and with activity as well as intermittent chest pressure not associated with activity.  Echo in 10/2021 showed LVEF of 65-70% with severely thickened LV, particularly at the apex, and concern for hypokinesis at the apex. You ordered a cardiac MRI to rule out HCM. Procedure is low risk but do we need to wait on the cardiac MRI prior proceeding? ? ?Please route response back to P CV DIV PREOP. ? ?Thank you! ?Rainy Rothman ?

## 2021-11-24 NOTE — Telephone Encounter (Signed)
External ear lesion is a low risk surgery     ?I don't think it should be done under genernal   IS IT?  ?Watch BP and FLuids   She is low risk for major complication and ok to proceed ?

## 2021-11-24 NOTE — Telephone Encounter (Signed)
? ?  [facility] Pre-operative Risk Assessment  ?  ?Request for surgical clearance: ? ?What type of surgery is being performed?  ?Excision of Right External Ear Lesion  ? ?When is this surgery scheduled?  ?11/29/21  ? ?What type of clearance is required (medical clearance vs. Pharmacy clearance to hold med vs. Both)?  ?Both  ? ?Are there any medications that need to be held prior to surgery and how long? ?Unsure  ? ?Practice name and name of physician performing surgery?  ?[place], [place] ? ?What is your office phone number? ?[phone]  ?  ?7.   What is your office fax number? ?[phone] (attn: Angela Nevin)  ? ?8.   Anesthesia type (None, local, MAC, general) ?  ?[place] ? ? ?Zara Council ?11/24/2021, 9:54 AM  ?

## 2021-11-25 NOTE — Telephone Encounter (Signed)
Up for  ? ?Primary Cardiologist: Dorris Carnes, MD ? ?Chart reviewed as part of pre-operative protocol coverage. Given past medical history and time since last visit, based on ACC/AHA guidelines, Sonya James would be at acceptable risk for the planned procedure without further cardiovascular testing.  ? ?Patient currently takes apixaban which is prescribed by a non- cardiology provider.  Recommendations for holding apixaban will need to come from prescribing office/provider. ? ?I will route this recommendation to the requesting party via Epic fax function and remove from pre-op pool. ? ?Please call with questions. ? ?[name]. [name] NP-C ? ?  ?11/25/2021, 8:56 AM ?[place] ?[address] ?Office [phone] Fax [phone] ? ? ? ? ?

## 2021-11-28 ENCOUNTER — Encounter (HOSPITAL_COMMUNITY): Payer: Self-pay | Admitting: Otolaryngology

## 2021-11-28 ENCOUNTER — Other Ambulatory Visit: Payer: Self-pay

## 2021-11-28 ENCOUNTER — Encounter: Payer: Self-pay | Admitting: Pharmacist

## 2021-11-28 ENCOUNTER — Telehealth: Payer: Self-pay | Admitting: Pharmacist

## 2021-11-28 ENCOUNTER — Ambulatory Visit (INDEPENDENT_AMBULATORY_CARE_PROVIDER_SITE_OTHER): Payer: Medicaid Other | Admitting: Pharmacist

## 2021-11-28 VITALS — BP 147/102 | HR 60 | Resp 16 | Ht 65.0 in | Wt 215.0 lb

## 2021-11-28 DIAGNOSIS — E782 Mixed hyperlipidemia: Secondary | ICD-10-CM | POA: Diagnosis not present

## 2021-11-28 DIAGNOSIS — E785 Hyperlipidemia, unspecified: Secondary | ICD-10-CM

## 2021-11-28 DIAGNOSIS — E7841 Elevated Lipoprotein(a): Secondary | ICD-10-CM

## 2021-11-28 NOTE — Progress Notes (Signed)
Patient ID: Sonya James                 DOB: [birth year]                    MRN: [id number] ? ? ? ? ?HPI: ?Sonya James is a 56 y.o. female patient referred to lipid clinic by Dr Harrington Challenger. PMH is significant for T2DM (A1c 8.2), HLD, aortic atherosclerosis, smoking, HTN and history of PE. Advanced lipid panel showed elevated lp(a), LDL particle number, and low HDL particle number. ? ?Patient presents today to learn about further cholesterol lowering treatments. Currently on atorvastatin 7m daily and reports no adverse effects. ? ?Has extensive family history of CAD. Reports mother, father, uncles and grandfather all had heart attacks or strokes. ? ?Reports she did not know she was diabetic despite A1c being elevated for many years. Has been trying to drink more water and decrease sugary beverages. ? ?Current Medications: atorvastatin 876mdaily ?Intolerances: N/A ?Risk Factors:  ?CAD ?HTN ?HLD ?T2DM ?Family history ?Smoking  ? ?LDL goal: <55 ? ?Labs: LDL particle number 1876, LDL 144, HDL 39, Trigs 138, TC 208, HDL particle number 21.6, small LDL particle number 738, lp(a) 213.9 (10/21/21 on atorvastatin 808m? ?Past Medical History:  ?Diagnosis Date  ? Anemia   ? Anxiety   ? Arthritis   ? Asthma 05/25/2013  ? Autonomic neuropathy   ? [place]rium ([place]  ? Cellulitis, face 05/03/2021  ? Constipation   ? COPD (chronic obstructive pulmonary disease) ([place] ? CTS (carpal tunnel syndrome)   ? Depression   ? Diabetes mellitus without complication ([place] ? Dysphagia 12/14/2016  ? Encounter for screening colonoscopy   ? Endometrial cancer ([place]  ? Gastritis without bleeding   ? GERD (gastroesophageal reflux disease)   ? Gross hematuria 05/11/2019  ? Headache(784.0)   ? Heavy menses 10/11/2015  ? HTN (hypertension)   ? Hypercholesterolemia   ? Hypothyroidism   ? IBS (irritable bowel syndrome)   ? Osteoarthritis   ? osteoarthritis  ? Pancreatitis   ? per patient   ? Recurrent boils   ? buttocks and low  back.  ? S/P laparoscopic hysterectomy 11/04/2015  ? Shortness of breath   ? Sleep apnea   ? CPAP machine  ? Uncontrolled type 2 diabetes mellitus with diabetic polyneuropathy, with long-term current use of insulin 08/04/2017  ? Uterine cancer ([place]  ? Vitamin D deficiency   ? ? ?Current Outpatient Medications on File Prior to Visit  ?Medication Sig Dispense Refill  ? Accu-Chek Softclix Lancets lancets To test glucose 4 times a day 150 each 2  ? albuterol (PROVENTIL) (2.5 MG/3ML) 0.083% nebulizer solution Take 3 mLs (2.5 mg total) by nebulization every 6 (six) hours as needed for wheezing. 75 mL 12  ? albuterol (VENTOLIN HFA) 108 (90 Base) MCG/ACT inhaler Inhale 2 puffs into the lungs every 6 (six) hours as needed for wheezing or shortness of breath. 18 g 5  ? amLODipine (NORVASC) 10 MG tablet Take 1 tablet (10 mg total) by mouth daily. 90 tablet 1  ? apixaban (ELIQUIS) 5 MG TABS tablet Take 1 tablet (5 mg total) by mouth 2 (two) times daily. On 08/12/17 @ 9PM, start 1 tab (5 mg) two times daily. (Patient taking differently: Take 5 mg by mouth 2 (two) times daily.) 60 tablet 5  ? atorvastatin (LIPITOR) 80 MG tablet Take 1 tablet (80 mg total) by mouth  daily. 90 tablet 3  ? Blood Glucose Monitoring Suppl (ACCU-CHEK GUIDE ME) w/Device KIT 1 Piece by Does not apply route as directed. 1 kit 0  ? clotrimazole (GYNE-LOTRIMIN 3) 2 % vaginal cream Place 1 Applicatorful vaginally at bedtime. (Patient taking differently: Place 1 Applicatorful vaginally at bedtime as needed (irritation).) 21 g 0  ? cycloSPORINE (RESTASIS) 0.05 % ophthalmic emulsion Place 1 drop into both eyes daily.    ? DULoxetine (CYMBALTA) 60 MG capsule Take 60 mg by mouth daily.    ? empagliflozin (JARDIANCE) 25 MG TABS tablet Take 1 tablet (25 mg total) by mouth daily before breakfast. 90 tablet 1  ? esomeprazole (NEXIUM) 40 MG capsule TAKE 1 CAPSULE BY MOUTH TWICE DAILY BEFORE MEAL(S) 180 capsule 3  ? furosemide (LASIX) 40 MG tablet Take 1 tablet by  mouth once daily 90 tablet 0  ? glucose blood (ACCU-CHEK GUIDE) test strip Test glucose 4 times a day 150 each 2  ? HYDROcodone-acetaminophen (NORCO) 7.5-325 MG tablet Take 1 tablet by mouth 3 (three) times daily as needed.    ? hydrocortisone (ANUSOL-HC) 2.5 % rectal cream Place 1 application rectally 2 (two) times daily. As needed for itching (Patient taking differently: Place 1 application. rectally 2 (two) times daily as needed for anal itching.) 30 g 1  ? insulin detemir (LEVEMIR FLEXPEN) 100 UNIT/ML FlexPen Inject 30 Units into the skin daily. 30 mL 3  ? Insulin Pen Needle 31G X 5 MM MISC 1 Device by Does not apply route daily in the afternoon. 100 each 2  ? labetalol (NORMODYNE) 100 MG tablet Take 1 tablet (100 mg total) by mouth 2 (two) times daily. 60 tablet 2  ? linaclotide (LINZESS) 290 MCG CAPS capsule Take 1 capsule (290 mcg total) by mouth daily before breakfast. 90 capsule 3  ? lisinopril (ZESTRIL) 40 MG tablet Take 1 tablet by mouth once daily 90 tablet 1  ? metFORMIN (GLUCOPHAGE) 1000 MG tablet Take 1 tablet (1,000 mg total) by mouth 2 (two) times daily with a meal. 60 tablet 2  ? Misc. Devices MISC Blood pressure monitoring device 1 each 0  ? montelukast (SINGULAIR) 10 MG tablet TAKE 1 TABLET BY MOUTH AT BEDTIME 90 tablet 0  ? mupirocin ointment (BACTROBAN) 2 % SMARTSIG:1 Application Topical 2-3 Times Daily    ? ondansetron (ZOFRAN-ODT) 4 MG disintegrating tablet Take 4 mg by mouth every 6 (six) hours as needed for nausea or vomiting.    ? potassium chloride (KLOR-CON) 10 MEQ tablet TAKE 1 TABLET BY MOUTH ONCE DAILY IN THE EVENING 90 tablet 0  ? rizatriptan (MAXALT) 10 MG tablet Take 10 mg by mouth daily as needed for migraine.    ? spironolactone (ALDACTONE) 25 MG tablet Take 1 tablet (25 mg total) by mouth daily. 90 tablet 3  ? topiramate (TOPAMAX) 25 MG tablet Take 25 mg by mouth daily.    ? Vitamin D, Ergocalciferol, (DRISDOL) 1.25 MG (50000 UNIT) CAPS capsule Take 1 capsule (50,000 Units  total) by mouth once a week. (Patient taking differently: Take 50,000 Units by mouth every Monday.) 8 capsule 0  ? ?No current facility-administered medications on file prior to visit.  ? ? ?No Known Allergies ? ?Assessment/Plan: ? ?1. Hyperlipidemia - Patient LDL 144 which is above goal of <55. Aggressive goal selected due to family history, T2DM, elevated lp(a), smoking, and aortic atherosclerosis. Currently on atorvastatin 74m and will continue. ? ?Recommended patient start PCSK9i. Using demo pen, educated patient on mechanism of action,  storage, site selection, administration and possible adverse effects, Patient was able to demonstrate in room. Will complete PA and contact patient when approved. Recheck lipid panel in 2-3 months. ? ?Continue atorvastatin 26m daily ?Start Repatha 1470msq q 14 days ?Recheck lipid panel in 2-3 months ? ?ChKarren CobblePharmD, BCACP, CDSignal MountainCPP ?[address] ?[place]Phone: [phone]: [phone]?  ?

## 2021-11-28 NOTE — Telephone Encounter (Signed)
REPATHA PA SENT TO CMM: ?Sonya James (Key: [id number]) ?Your information has been sent to [facility]. ? ?WILL WAIT TO ORDER LIPID PANEL WHEN TALK TO PT AND DETERMINE IF SHE WANTS TO DO AT [facility] OR [facility] OR [facility].  ?

## 2021-11-28 NOTE — Patient Instructions (Addendum)
It was nice meeting you today ? ?We would like to have your LDL (bad cholesterol) less than 55 ? ?Please continue your atorvastatin '80mg'$  daily ? ?We will start a new medication called Repatha. You will inject one pen every 14 days ? ?We will complete the prior authorization for you and contact you when it is approved ? ?Once you start the medication we will recheck your cholesterol in 2-3 months ? ?Please call with any questions! ? ?Karren Cobble, PharmD, BCACP, [place], CPP ?[employer], [address] ?[place], [place], [address] ?Phone: [phone], Fax: [phone]  ? ? ? ?

## 2021-11-28 NOTE — Progress Notes (Signed)
DUE TO COVID-19 ONLY ONE VISITOR IS ALLOWED TO COME WITH YOU AND STAY IN THE WAITING ROOM ONLY DURING PRE OP AND PROCEDURE DAY OF SURGERY.  ? ?PCP - Dr Posey Pronto  ?Cardiologist - Dr Dorris Carnes ? ?Chest x-ray - 06/09/21 (2V) ?EKG - 10/14/21 ?Stress Test - n/a ?ECHO - 11/08/21 ?Cardiac Cath - n/a ? ?ICD Pacemaker/Loop - n/a ? ?Sleep Study -  Yes ?CPAP - does not use CPAP nightly ? ?Do not Metformin on the morning of surgery. ?     ?THE MORNING OF SURGERY, take 15 Units Levemir Insulin.   ? ?If your blood sugar is less than 70 mg/dL, you will need to treat for low blood sugar: ?Treat a low blood sugar (less than 70 mg/dL) with ? cup of clear juice (cranberry or apple), 4 glucose tablets, OR glucose gel. ?Recheck blood sugar in 15 minutes after treatment (to make sure it is greater than 70 mg/dL). If your blood sugar is not greater than 70 mg/dL on recheck, call [phone] for further instructions. ? ?Blood Thinner Instructions:  Follow your surgeon's instructions on when to stop Eliquis prior to surgery.  Last dose was on 11/24/21. ? ?Anesthesia review: Yes ? ?STOP now taking any Aspirin (unless otherwise instructed by your surgeon), Aleve, Naproxen, Ibuprofen, Motrin, Advil, Goody's, BC's, all herbal medications, fish oil, and all vitamins.  ? ?Coronavirus Screening ?Covid test n/a Ambulatory Surgery  ?Do you have any of the following symptoms:  ?Cough yes/no: No ?Fever (>100.47F)  yes/no: No ?Runny nose yes/no: No ?Sore throat Yes - COPD ?Difficulty breathing/shortness of breath  yes/no: No ? ?Have you traveled in the last 14 days and where? yes/no: No ? ?Patient verbalized understanding of instructions that were given via phone. ?

## 2021-11-28 NOTE — Telephone Encounter (Signed)
Please complete prior authorization for: ? ?Name of medication, dose, and frequency repatha 140mg sq q 14 days ? ?Lab Orders Requested? yes ? ?Which labs? Lipid panel ? ?Estimated date for labs to be scheduled 2-3 months ? ?Does patient need activated copay card? no  ?

## 2021-11-28 NOTE — Anesthesia Preprocedure Evaluation (Addendum)
Anesthesia Evaluation  ?Patient identified by MRN, date of birth, ID band ?Patient awake ? ? ? ?Reviewed: ?Allergy & Precautions, NPO status , Patient's Chart, lab work & pertinent test results ? ?Airway ?Mallampati: II ? ?TM Distance: >3 FB ?Neck ROM: Full ? ? ? Dental ? ?(+) Dental Advisory Given, Chipped,  ?  ?Pulmonary ?asthma , sleep apnea and Continuous Positive Airway Pressure Ventilation , COPD,  COPD inhaler, Current Smoker and Patient abstained from smoking.,  ?  ?Pulmonary exam normal ?breath sounds clear to auscultation ? ? ? ? ? ? Cardiovascular ?hypertension, Pt. on medications ?(-) angina(-) Past MI Normal cardiovascular exam ?Rhythm:Regular Rate:Normal ? ? ?  ?Neuro/Psych ? Headaches, PSYCHIATRIC DISORDERS Anxiety Depression  Neuromuscular disease   ? GI/Hepatic ?Neg liver ROS, GERD  Medicated,  ?Endo/Other  ?diabetes, Type 2, Oral Hypoglycemic Agents, Insulin DependentHypothyroidism  ? Renal/GU ?negative Renal ROS  ? ?  ?Musculoskeletal ? ?(+) Arthritis ,  ? Abdominal ?  ?Peds ? Hematology ?negative hematology ROS ?(+)   ?Anesthesia Other Findings ?Day of surgery medications reviewed with the patient. ? Reproductive/Obstetrics ? ?  ? ? ? ? ? ? ? ? ? ? ? ? ? ?  ?  ? ? ? ? ? ? ? ?Anesthesia Physical ?Anesthesia Plan ? ?ASA: 3 ? ?Anesthesia Plan: MAC  ? ?Post-op Pain Management: Tylenol PO (pre-op)*  ? ?Induction: Intravenous ? ?PONV Risk Score and Plan: 1 and TIVA and Treatment may vary due to age or medical condition ? ?Airway Management Planned: Natural Airway and Nasal Cannula ? ?Additional Equipment:  ? ?Intra-op Plan:  ? ?Post-operative Plan:  ? ?Informed Consent: I have reviewed the patients History and Physical, chart, labs and discussed the procedure including the risks, benefits and alternatives for the proposed anesthesia with the patient or authorized representative who has indicated his/her understanding and acceptance.  ? ? ? ?Dental advisory  given ? ?Plan Discussed with: CRNA ? ?Anesthesia Plan Comments: (PAT note by Karoline Caldwell, PA-C: ?Patient recently evaluated by cardiology for history of HTN, DOE and intermittent chest pressure (not associated with activity).  She was also noted to have a systolic murmur on exam.  Echo was ordered.  Per follow-up note by Dr. Harrington Challenger on 11/10/2021, echo showed the LV to be severely thickened, particularly apex.  Cardiac MRI was ordered due to concern for hypertrophic cardiomyopathy.  This has not yet been done.  Dr. Harrington Challenger commented on surgical risk in telephone encounter 11/24/2021 and said patient okay to proceed with surgery prior to having cardiac MRI, stated patient is low risk for major complication. ? 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  Patient reported last dose 11/24/2021. ? ?History of OSA on CPAP (does not use consistently). ? ?History of uncontrolled insulin-dependent DM2, last A1c 8.2 on 10/21/2021.  Followed by endocrinologist Dr. Kelton Pillar ? ?Patient will need day of surgery labs and evaluation. ? ?EKG 10/14/21: Sinus bradycardia. Rate 56.  LVH with repolarization abnormality. ? ?TTE 11/08/21: ??1. Left ventricular ejection fraction, by estimation, is 65 to 70%. The  ?left ventricle has normal function. Left ventricular endocardial border  ?not optimally defined to evaluate regional wall motion. There is moderate  ?left ventricular hypertrophy. Left  ?ventricular diastolic parameters are consistent with Grade I diastolic  ?dysfunction (impaired relaxation).  ??2. Right ventricular systolic function is normal. The right ventricular  ?size is normal. Tricuspid regurgitation signal is inadequate for assessing  ?PA pressure.  ??3. The mitral valve is normal in structure. No evidence of mitral  valve  ?regurgitation. No evidence of mitral stenosis.  ??4. The aortic valve is tricuspid. There is mild calcification of the  ?aortic valve. There is mild thickening of the aortic valve. Aortic valve  ?regurgitation is not  visualized. No aortic stenosis is present.  ??5. The inferior vena cava is normal in size with greater than 50%  ?respiratory variability, suggesting right atrial pressure of 3 mmHg.  ? ? ?)  ? ? ? ? ? ?Anesthesia Quick Evaluation ? ?

## 2021-11-28 NOTE — Progress Notes (Signed)
Anesthesia Chart Review: ?Same day workup ? ?Patient recently evaluated by cardiology for history of HTN, DOE and intermittent chest pressure (not associated with activity).  She was also noted to have a systolic murmur on exam.  Echo was ordered.  Per follow-up note by Dr. Harrington Challenger on 11/10/2021, echo showed the LV to be severely thickened, particularly apex.  Cardiac MRI was ordered due to concern for hypertrophic cardiomyopathy.  This has not yet been done.  Dr. Harrington Challenger commented on surgical risk in telephone encounter 11/24/2021 and said patient okay to proceed with surgery prior to having cardiac MRI, stated patient is low risk for major complication. ? 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  Patient reported last dose 11/24/2021. ? ?History of OSA on CPAP (does not use consistently). ? ?History of uncontrolled insulin-dependent DM2, last A1c 8.2 on 10/21/2021.  Followed by endocrinologist Dr. Kelton Pillar ? ?Patient will need day of surgery labs and evaluation. ? ?EKG 10/14/21: Sinus bradycardia. Rate 56.  LVH with repolarization abnormality. ? ?TTE 11/08/21: ? 1. Left ventricular ejection fraction, by estimation, is 65 to 70%. The  ?left ventricle has normal function. Left ventricular endocardial border  ?not optimally defined to evaluate regional wall motion. There is moderate  ?left ventricular hypertrophy. Left  ?ventricular diastolic parameters are consistent with Grade I diastolic  ?dysfunction (impaired relaxation).  ? 2. Right ventricular systolic function is normal. The right ventricular  ?size is normal. Tricuspid regurgitation signal is inadequate for assessing  ?PA pressure.  ? 3. The mitral valve is normal in structure. No evidence of mitral valve  ?regurgitation. No evidence of mitral stenosis.  ? 4. The aortic valve is tricuspid. There is mild calcification of the  ?aortic valve. There is mild thickening of the aortic valve. Aortic valve  ?regurgitation is not visualized. No aortic stenosis is present.  ? 5. The  inferior vena cava is normal in size with greater than 50%  ?respiratory variability, suggesting right atrial pressure of 3 mmHg.  ? ? ?Karoline Caldwell, PA-C ?[facility]/Anesthesiology ?Phone [phone] ?11/28/2021 2:23 PM ? ?

## 2021-11-29 ENCOUNTER — Other Ambulatory Visit: Payer: Self-pay

## 2021-11-29 ENCOUNTER — Encounter (HOSPITAL_COMMUNITY)
Admission: RE | Disposition: A | Discharge status: Home / Self Care | Payer: Self-pay | Source: Ambulatory Visit | Attending: Otolaryngology

## 2021-11-29 ENCOUNTER — Ambulatory Visit (HOSPITAL_BASED_OUTPATIENT_CLINIC_OR_DEPARTMENT_OTHER): Payer: Medicaid Other | Admitting: Physician Assistant

## 2021-11-29 ENCOUNTER — Encounter (HOSPITAL_COMMUNITY): Payer: Self-pay | Admitting: Otolaryngology

## 2021-11-29 ENCOUNTER — Other Ambulatory Visit (HOSPITAL_COMMUNITY): Payer: Self-pay

## 2021-11-29 ENCOUNTER — Ambulatory Visit (HOSPITAL_COMMUNITY)
Admission: RE | Admit: 2021-11-29 | Discharge: 2021-11-29 | Disposition: A | Discharge status: Home / Self Care | Payer: Medicaid Other | Source: Ambulatory Visit | Attending: Otolaryngology | Admitting: Otolaryngology

## 2021-11-29 ENCOUNTER — Ambulatory Visit (HOSPITAL_COMMUNITY): Payer: Medicaid Other | Admitting: Physician Assistant

## 2021-11-29 ENCOUNTER — Ambulatory Visit: Payer: Medicaid Other | Admitting: Orthopaedic Surgery

## 2021-11-29 DIAGNOSIS — R519 Headache, unspecified: Secondary | ICD-10-CM | POA: Diagnosis not present

## 2021-11-29 DIAGNOSIS — G4733 Obstructive sleep apnea (adult) (pediatric): Secondary | ICD-10-CM | POA: Insufficient documentation

## 2021-11-29 DIAGNOSIS — G4730 Sleep apnea, unspecified: Secondary | ICD-10-CM | POA: Insufficient documentation

## 2021-11-29 DIAGNOSIS — E119 Type 2 diabetes mellitus without complications: Secondary | ICD-10-CM

## 2021-11-29 DIAGNOSIS — F17210 Nicotine dependence, cigarettes, uncomplicated: Secondary | ICD-10-CM | POA: Diagnosis not present

## 2021-11-29 DIAGNOSIS — I10 Essential (primary) hypertension: Secondary | ICD-10-CM | POA: Diagnosis not present

## 2021-11-29 DIAGNOSIS — F419 Anxiety disorder, unspecified: Secondary | ICD-10-CM | POA: Diagnosis not present

## 2021-11-29 DIAGNOSIS — L729 Follicular cyst of the skin and subcutaneous tissue, unspecified: Secondary | ICD-10-CM | POA: Insufficient documentation

## 2021-11-29 DIAGNOSIS — H6191 Disorder of right external ear, unspecified: Secondary | ICD-10-CM | POA: Diagnosis present

## 2021-11-29 DIAGNOSIS — Z9889 Other specified postprocedural states: Secondary | ICD-10-CM | POA: Diagnosis not present

## 2021-11-29 DIAGNOSIS — Q181 Preauricular sinus and cyst: Secondary | ICD-10-CM | POA: Diagnosis not present

## 2021-11-29 DIAGNOSIS — K219 Gastro-esophageal reflux disease without esophagitis: Secondary | ICD-10-CM | POA: Diagnosis not present

## 2021-11-29 DIAGNOSIS — E669 Obesity, unspecified: Secondary | ICD-10-CM | POA: Diagnosis not present

## 2021-11-29 DIAGNOSIS — J45909 Unspecified asthma, uncomplicated: Secondary | ICD-10-CM | POA: Insufficient documentation

## 2021-11-29 DIAGNOSIS — Z7984 Long term (current) use of oral hypoglycemic drugs: Secondary | ICD-10-CM

## 2021-11-29 DIAGNOSIS — R011 Cardiac murmur, unspecified: Secondary | ICD-10-CM | POA: Diagnosis not present

## 2021-11-29 DIAGNOSIS — Z79899 Other long term (current) drug therapy: Secondary | ICD-10-CM | POA: Diagnosis not present

## 2021-11-29 DIAGNOSIS — E1142 Type 2 diabetes mellitus with diabetic polyneuropathy: Secondary | ICD-10-CM | POA: Insufficient documentation

## 2021-11-29 DIAGNOSIS — Z7901 Long term (current) use of anticoagulants: Secondary | ICD-10-CM | POA: Diagnosis not present

## 2021-11-29 DIAGNOSIS — Z6835 Body mass index (BMI) 35.0-35.9, adult: Secondary | ICD-10-CM | POA: Diagnosis not present

## 2021-11-29 DIAGNOSIS — F32A Depression, unspecified: Secondary | ICD-10-CM | POA: Diagnosis not present

## 2021-11-29 DIAGNOSIS — J449 Chronic obstructive pulmonary disease, unspecified: Secondary | ICD-10-CM | POA: Diagnosis not present

## 2021-11-29 DIAGNOSIS — L720 Epidermal cyst: Secondary | ICD-10-CM | POA: Diagnosis not present

## 2021-11-29 DIAGNOSIS — Z794 Long term (current) use of insulin: Secondary | ICD-10-CM | POA: Insufficient documentation

## 2021-11-29 HISTORY — PX: EAR CYST EXCISION: SHX22

## 2021-11-29 LAB — BASIC METABOLIC PANEL
Anion gap: 3 — ABNORMAL LOW (ref 5–15)
BUN: 12 mg/dL (ref 6–20)
CO2: 27 mmol/L (ref 22–32)
Calcium: 9.5 mg/dL (ref 8.9–10.3)
Chloride: 108 mmol/L (ref 98–111)
Creatinine, Ser: 0.62 mg/dL (ref 0.44–1.00)
GFR, Estimated: >60 mL/min (ref >60)
Glucose, Bld: 258 mg/dL — ABNORMAL HIGH (ref 70–99)
Potassium: 4.3 mmol/L (ref 3.5–5.1)
Sodium: 138 mmol/L (ref 135–145)

## 2021-11-29 LAB — GLUCOSE, CAPILLARY
Glucose-Capillary: 277 mg/dL — ABNORMAL HIGH (ref 70–99)
Glucose-Capillary: 228 mg/dL — ABNORMAL HIGH (ref 70–99)

## 2021-11-29 LAB — CBC
HCT: 41.7 % (ref 36.0–46.0)
Hemoglobin: 13.2 g/dL (ref 12.0–15.0)
MCH: 30.6 pg (ref 26.0–34.0)
MCHC: 31.7 g/dL (ref 30.0–36.0)
MCV: 96.8 fL (ref 80.0–100.0)
Platelets: 321 10*3/uL (ref 150–400)
RBC: 4.31 MIL/uL (ref 3.87–5.11)
RDW: 12.8 % (ref 11.5–15.5)
WBC: 9 10*3/uL (ref 4.0–10.5)
nRBC: 0 % (ref 0.0–0.2)

## 2021-11-29 SURGERY — EXCISION, CYST, EAR
Anesthesia: Monitor Anesthesia Care | Laterality: Right

## 2021-11-29 MED ORDER — LIDOCAINE-EPINEPHRINE 1 %-1:100000 IJ SOLN
INTRAMUSCULAR | Status: AC
  Filled 2021-11-29: qty 1

## 2021-11-29 MED ORDER — CEFAZOLIN SODIUM-DEXTROSE 2-4 GM/100ML-% IV SOLN
2.0000 g | INTRAVENOUS | Status: AC
  Administered 2021-11-29: 2 g via INTRAVENOUS
  Filled 2021-11-29: qty 100

## 2021-11-29 MED ORDER — CHLORHEXIDINE GLUCONATE 0.12 % MT SOLN
15.0000 mL | Freq: Once | OROMUCOSAL | Status: AC
  Administered 2021-11-29: 15 mL via OROMUCOSAL

## 2021-11-29 MED ORDER — PROPOFOL 10 MG/ML IV BOLUS
INTRAVENOUS | Status: DC | PRN
  Administered 2021-11-29: 20 mg via INTRAVENOUS
  Administered 2021-11-29: 50 mg via INTRAVENOUS

## 2021-11-29 MED ORDER — ONDANSETRON HCL 4 MG/2ML IJ SOLN: 4.0000 mg | Freq: Once | INTRAMUSCULAR | Status: DC | PRN

## 2021-11-29 MED ORDER — INSULIN ASPART 100 UNIT/ML IJ SOLN
0.0000 [IU] | INTRAMUSCULAR | Status: AC | PRN
  Administered 2021-11-29: 6 [IU] via SUBCUTANEOUS
  Administered 2021-11-29: 8 [IU] via SUBCUTANEOUS
  Filled 2021-11-29 (×2): qty 1

## 2021-11-29 MED ORDER — LIDOCAINE 2% (20 MG/ML) 5 ML SYRINGE
INTRAMUSCULAR | Status: AC
  Filled 2021-11-29: qty 5

## 2021-11-29 MED ORDER — MIDAZOLAM HCL 2 MG/2ML IJ SOLN
INTRAMUSCULAR | Status: AC
  Filled 2021-11-29: qty 2

## 2021-11-29 MED ORDER — MIDAZOLAM HCL 2 MG/2ML IJ SOLN
INTRAMUSCULAR | Status: DC | PRN
  Administered 2021-11-29: 2 mg via INTRAVENOUS

## 2021-11-29 MED ORDER — DOCUSATE SODIUM 100 MG PO CAPS
100.0000 mg | ORAL_CAPSULE | Freq: Two times a day (BID) | ORAL | 0 refills | Status: AC | PRN
  Filled 2021-11-29: qty 20, 10d supply, fill #0

## 2021-11-29 MED ORDER — HYDROCODONE-ACETAMINOPHEN 5-325 MG PO TABS
1.0000 | ORAL_TABLET | ORAL | 0 refills | Status: DC | PRN
  Filled 2021-11-29: qty 20, 4d supply, fill #0

## 2021-11-29 MED ORDER — ACETAMINOPHEN 500 MG PO TABS
1000.0000 mg | ORAL_TABLET | Freq: Once | ORAL | Status: AC
  Administered 2021-11-29: 1000 mg via ORAL
  Filled 2021-11-29: qty 2

## 2021-11-29 MED ORDER — ONDANSETRON HCL 4 MG/2ML IJ SOLN
INTRAMUSCULAR | Status: AC
  Filled 2021-11-29: qty 2

## 2021-11-29 MED ORDER — PROPOFOL 500 MG/50ML IV EMUL
INTRAVENOUS | Status: DC | PRN
  Administered 2021-11-29: 50 ug/kg/min via INTRAVENOUS

## 2021-11-29 MED ORDER — LEVOFLOXACIN 500 MG PO TABS
500.0000 mg | ORAL_TABLET | Freq: Every day | ORAL | 0 refills | Status: AC
  Filled 2021-11-29: qty 10, 10d supply, fill #0

## 2021-11-29 MED ORDER — CHLORHEXIDINE GLUCONATE 0.12 % MT SOLN
OROMUCOSAL | Status: AC
  Filled 2021-11-29: qty 15

## 2021-11-29 MED ORDER — LACTATED RINGERS IV SOLN: INTRAVENOUS | Status: DC

## 2021-11-29 MED ORDER — FENTANYL CITRATE (PF) 100 MCG/2ML IJ SOLN: 25.0000 ug | INTRAMUSCULAR | Status: DC | PRN

## 2021-11-29 MED ORDER — ONDANSETRON HCL 4 MG/2ML IJ SOLN
INTRAMUSCULAR | Status: DC | PRN
  Administered 2021-11-29: 4 mg via INTRAVENOUS

## 2021-11-29 MED ORDER — ORAL CARE MOUTH RINSE: 15.0000 mL | Freq: Once | OROMUCOSAL | Status: AC

## 2021-11-29 MED ORDER — LIDOCAINE-EPINEPHRINE 1 %-1:100000 IJ SOLN
INTRAMUSCULAR | Status: DC | PRN
  Administered 2021-11-29: 15 mL via INTRADERMAL

## 2021-11-29 MED ORDER — FLUCONAZOLE 100 MG PO TABS
100.0000 mg | ORAL_TABLET | Freq: Once | ORAL | 0 refills | Status: AC
  Filled 2021-11-29: qty 1, 1d supply, fill #0

## 2021-11-29 MED ORDER — BACITRACIN ZINC 500 UNIT/GM EX OINT
TOPICAL_OINTMENT | CUTANEOUS | Status: AC
  Filled 2021-11-29: qty 28.35

## 2021-11-29 SURGICAL SUPPLY — 50 items
ATTRACTOMAT 16X20 MAGNETIC DRP (DRAPES) IMPLANT
BAG COUNTER SPONGE SURGICOUNT (BAG) ×2 IMPLANT
BAG SPNG CNTER NS LX DISP (BAG) ×1
BLADE CLIPPER SURG (BLADE) IMPLANT
BLADE SURG 15 STRL LF DISP TIS (BLADE) IMPLANT
BLADE SURG 15 STRL SS (BLADE)
CLEANER TIP ELECTROSURG 2X2 (MISCELLANEOUS) ×2 IMPLANT
CNTNR URN SCR LID CUP LEK RST (MISCELLANEOUS) ×1 IMPLANT
CONT SPEC 4OZ STRL OR WHT (MISCELLANEOUS) ×2
CORD BIPOLAR FORCEPS 12FT (ELECTRODE) ×2 IMPLANT
COVER SURGICAL LIGHT HANDLE (MISCELLANEOUS) ×2 IMPLANT
DRAPE HALF SHEET 40X57 (DRAPES) IMPLANT
DRAPE SURG 17X23 STRL (DRAPES) IMPLANT
ELECT COATED BLADE 2.86 ST (ELECTRODE) ×2 IMPLANT
ELECT REM PT RETURN 9FT ADLT (ELECTROSURGICAL) ×2
ELECTRODE REM PT RTRN 9FT ADLT (ELECTROSURGICAL) ×1 IMPLANT
FORCEPS BIPOLAR SPETZLER 8 1.0 (NEUROSURGERY SUPPLIES) ×2 IMPLANT
GAUZE 4X4 16PLY ~~LOC~~+RFID DBL (SPONGE) IMPLANT
GAUZE SPONGE 4X4 12PLY STRL (GAUZE/BANDAGES/DRESSINGS) IMPLANT
GLOVE SURG ENC MOIS LTX SZ6 (GLOVE) ×2 IMPLANT
GOWN STRL REUS W/ TWL LRG LVL3 (GOWN DISPOSABLE) ×2 IMPLANT
GOWN STRL REUS W/TWL LRG LVL3 (GOWN DISPOSABLE) ×4
KIT BASIN OR (CUSTOM PROCEDURE TRAY) ×2 IMPLANT
KIT TURNOVER KIT B (KITS) ×2 IMPLANT
LOCATOR NERVE 3 VOLT (DISPOSABLE) IMPLANT
NDL HYPO 25GX1X1/2 BEV (NEEDLE) IMPLANT
NEEDLE HYPO 25GX1X1/2 BEV (NEEDLE) IMPLANT
NS IRRIG 1000ML POUR BTL (IV SOLUTION) ×2 IMPLANT
PAD ARMBOARD 7.5X6 YLW CONV (MISCELLANEOUS) ×4 IMPLANT
PENCIL SMOKE EVACUATOR (MISCELLANEOUS) ×2 IMPLANT
POSITIONER HEAD DONUT 9IN (MISCELLANEOUS) IMPLANT
SPECIMEN JAR MEDIUM (MISCELLANEOUS) ×2 IMPLANT
SPONGE INTESTINAL PEANUT (DISPOSABLE) IMPLANT
SPONGE T-LAP 18X18 ~~LOC~~+RFID (SPONGE) ×2 IMPLANT
STAPLER VISISTAT 35W (STAPLE) ×2 IMPLANT
SUT CHROMIC 3 0 PS 2 (SUTURE) IMPLANT
SUT ETHILON 5 0 PS 2 18 (SUTURE) IMPLANT
SUT ETHILON 6 0 9-3 1X18 BLK (SUTURE) ×2 IMPLANT
SUT SILK 2 0 (SUTURE)
SUT SILK 2-0 18XBRD TIE 12 (SUTURE) IMPLANT
SUT SILK 3 0 (SUTURE) ×2
SUT SILK 3 0 SH CR/8 (SUTURE) ×2 IMPLANT
SUT SILK 3-0 18XBRD TIE 12 (SUTURE) ×1 IMPLANT
SUT SILK 4 0 (SUTURE) ×2
SUT SILK 4-0 18XBRD TIE 12 (SUTURE) ×1 IMPLANT
SUT VICRYL RAPID 5 0 P 3 (SUTURE) ×2 IMPLANT
TOWEL GREEN STERILE FF (TOWEL DISPOSABLE) ×2 IMPLANT
TRAY ENT MC OR (CUSTOM PROCEDURE TRAY) ×2 IMPLANT
UNDERPAD 30X36 HEAVY ABSORB (UNDERPADS AND DIAPERS) IMPLANT
WATER STERILE IRR 1000ML POUR (IV SOLUTION) ×2 IMPLANT

## 2021-11-29 NOTE — Telephone Encounter (Signed)
Received phone call from pt insurance explaining that PA was not denied due to lack of labs. PA denied bc patient has not tried and failed or has a CI to ezetimibe.  ?I explained to her that ezetimibe would not get pt to goal. She agreed with that but did not have the authority to overturn it. States patient can try ezetimibe or we could write an appeals letter explaining our clinical choice. ?

## 2021-11-29 NOTE — Anesthesia Postprocedure Evaluation (Signed)
Anesthesia Post Note  Patient: Sonya James  Procedure(s) Performed: EXCISION OF EXTERNAL EAR LESION (Right)     Patient location during evaluation: PACU Anesthesia Type: MAC Level of consciousness: awake and alert Pain management: pain level controlled Vital Signs Assessment: post-procedure vital signs reviewed and stable Respiratory status: spontaneous breathing, nonlabored ventilation and respiratory function stable Cardiovascular status: stable and blood pressure returned to baseline Postop Assessment: no apparent nausea or vomiting Anesthetic complications: no   No notable events documented.  Last Vitals:  Vitals:   11/29/21 1220 11/29/21 1235  BP: (!) 141/71 137/68  Pulse: 65 (!) 57  Resp: 16 18  Temp: (!) 36.1 C (!) 36.1 C  SpO2: 100% 100%    Last Pain:  Vitals:   11/29/21 1235  TempSrc:   PainSc: 0-No pain                 Winferd Wease,W. EDMOND

## 2021-11-29 NOTE — Discharge Instructions (Signed)
Apply bacitracin ointment to incision twice daily for 48 hours. Can shower and bathe normally. Do not scrub the incision. Sutures are dissolving.  ?

## 2021-11-29 NOTE — Op Note (Signed)
OPERATIVE NOTE ? ?BELIA FEBO Date/Time of Admission: 11/29/2021  7:33 AM  ?CSN: [id number];OIT:[id number] Attending Provider: Ebbie Latus A, DO ?Room/Bed: MCPO/NONE DOB: [birth year] Age: 56 y.o. ? ? ?Pre-Op Diagnosis: ?cystic lesion of right external ear ? ?Post-Op Diagnosis: ?cystic lesion of right external ear ? ?Procedure: ?Procedure(s): ?EXCISION OF RIGHT EXTERNAL EAR LESION ? ?Anesthesia: ?Monitor Anesthesia Care ? ?Surgeon(s): ?Fenwick, DO ? ?Staff: ?Circulator: Ma Rings, RN ?Scrub Person: Dante Gang ? ?Implants: ?* No implants in log * ? ?Specimens: ?ID Type Source Tests Collected by Time Destination  ?1 : right external ear lesion Tissue PATH Soft tissue SURGICAL PATHOLOGY Kristy Catoe A, DO 11/29/2021 1208   ? ? ?Complications: ?None ? ?EBL: ?<5 ML ? ?Condition: ?stable ? ?Operative Findings:  ?1 cm cystic lesion of the right helix, no evidence of infection ? ?Description of Operation: ?After the identity of the patient and informed consent were confirmed in the preop holding area patient was brought back to the OR and placed in the supine position on the table. Monitored anesthesia care was induced. Care of the patient was then turned over to the operating team.  ? ?The area of the right auricular cyst was injected with 14 cc of 1% lidocaine with 1:100,000 epinephrine in a circumferential auricular block and the site was prepped and draped in a sterile fashion.   A 1.0 x 0.5 cm elliptical incision was designed around the cyst. Incision was made with a 15 blade scalpel and carried down to the subcuticular tissue and the cyst was encountered and sharply excised from the surrounding tissue.  Wound was irrigated with sterile saline and suctioned dry. Hemostasis was achieved using bipolar cautery. The wound was closed in multiple layers with 4-0 vicryl and 5-0 vicryl rapide.    Bacitracin was applied to the incision line.  The patient was then turned over to the care of  anesthesia, and taken to the PACU in stable condition.  ? ? ? ?Brielle, DO ?[facility]  ?11/29/2021   ? ?

## 2021-11-29 NOTE — H&P (Signed)
Sonya James is an 56 y.o. female.   ? ?Chief Complaint:  Cyst of right external ear ? ?HPI: Patient presents today for planned elective procedure. She denies any interval change in history since office visit on 10/24/2021: ? ?Sonya James is a 56 y.o. female who presents as a return consult, referred by Karen Kays, Shan Levans*, for evaluation and treatment of cystic lesion of right external ear. Patient was last seen in our office on 07/08/2021, and at that time had been noted to be doing well. Patient is s/p excision of left auricular lesion and endaural meatoplasty on 06/15/2021. She is not having any pain in the left ear anymore, but has recently noted swelling and tenderness to touch in the right external ear. She continues to smoke on a daily basis, but states that she is working on quitting. She continues to work on her diet and weight loss, and improved control of her diabetes mellitus. She recently had a thyroid ultrasound due to reports of dysphagia to solid food only. Thyroid ultrasound did not reveal any nodules requiring further work-up. She has not had a swallow study. ? ?Past Medical History:  ?Diagnosis Date  ? Anemia   ? Anxiety   ? Arthritis   ? Asthma 05/25/2013  ? Autonomic neuropathy   ? Cancer of endometrium ([place]) 10/18/2015  ? Cellulitis, face 05/03/2021  ? Constipation   ? COPD (chronic obstructive pulmonary disease) ([place])   ? CTS (carpal tunnel syndrome)   ? Depression   ? Diabetes mellitus without complication ([place])   ? Dysphagia 12/14/2016  ? Encounter for screening colonoscopy   ? Endometrial cancer ([place]) 2017  ? Gastritis without bleeding   ? GERD (gastroesophageal reflux disease)   ? Gross hematuria 05/11/2019  ? Headache(784.0)   ? Heavy menses 10/11/2015  ? HTN (hypertension)   ? Hypercholesterolemia   ? Hypothyroidism   ? IBS (irritable bowel syndrome)   ? Osteoarthritis   ? osteoarthritis  ? Pancreatitis   ? per patient   ? Recurrent boils   ? buttocks and low back.  ? S/P  laparoscopic hysterectomy 11/04/2015  ? Shortness of breath   ? Sleep apnea   ? CPAP machine  ? Uncontrolled type 2 diabetes mellitus with diabetic polyneuropathy, with long-term current use of insulin 08/04/2017  ? Uterine cancer ([place]) 2017  ? Vitamin D deficiency   ? ? ?Past Surgical History:  ?Procedure Laterality Date  ? ABDOMINAL HYSTERECTOMY  2018  ? endometrial cancer, surgery done at [facility]   ? BIOPSY  01/02/2017  ? Procedure: BIOPSY;  Surgeon: Danie Binder, MD;  [facility];  Service: Endoscopy;;  gastric biopsy  ? [place]  ? COLONOSCOPY WITH PROPOFOL N/A 01/02/2017  ? Dr. Oneida Alar: redundant colon, two 2-3 mm polyps in sigmoid colon (hyperplastic)  ? ESOPHAGOGASTRODUODENOSCOPY  05/22/2011  ? Dr. Oneida Alar: H.pylori gastritis   ? ESOPHAGOGASTRODUODENOSCOPY (EGD) WITH PROPOFOL N/A 01/02/2017  ? Dr. 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 s/p dilation, moderate gastritis (negative H.pylori)  ? FOOT SURGERY Left   ? tendon repair  ? IR US GUIDE VASC ACCESS LEFT  01/18/2021  ? OOPHORECTOMY    ? OTOPLASATY Left 06/15/2021  ? Procedure: Excision of left auricular lesion with endaural meatoplasty;  Surgeon: Jason Coop, DO;  Location: [place];  Service: ENT;  Laterality: Left;  ? POLYPECTOMY  01/02/2017  ? Procedure: POLYPECTOMY;  Surgeon: Danie Binder, MD;  [facility];  Service: Endoscopy;;  sigmoid colon polyps times 2  ? REDUCTION MAMMAPLASTY  1996  ? SAVORY DILATION N/A 01/02/2017  ? Procedure: SAVORY DILATION;  Surgeon: Danie Binder, MD;  [facility];  Service: Endoscopy;  Laterality: N/A;  ? TUBAL LIGATION    ? ? ?Family History  ?Problem Relation Age of Onset  ? Diabetes Mother   ? Hypertension Mother   ? COPD Mother   ? Asthma Mother   ? Hypercholesterolemia Mother   ? Hypertension Sister   ? Hyperlipidemia Sister   ? Diabetes Sister   ? Asthma Brother   ? Alcohol abuse Brother   ? Asthma Son   ? Cancer Maternal Grandmother   ?     lung, throat,  breast  ? Alzheimer's disease Maternal Grandmother   ? Hypertension Maternal Grandmother   ? Hypercholesterolemia Maternal Grandmother   ? Heart disease Maternal Grandfather   ? Stroke Maternal Grandfather   ? Clotting disorder Maternal Grandfather   ?     blood clots in legs  ? Hypertension Sister   ? Hyperlipidemia Sister   ? Stroke Maternal Aunt   ? Clotting disorder Maternal Aunt   ?     blood clots in legs  ? Hypertension Maternal Aunt   ? Hypercholesterolemia Maternal Aunt   ? Hypertension Maternal Aunt   ? Asthma Maternal Aunt   ? Clotting disorder Maternal Uncle   ?     blood clot -legs traveled to heart and he died of heart attack  ? Colon cancer Neg Hx   ? ? ?Social History:  reports that she has been smoking cigarettes. She has a 22.00 pack-year smoking history. She has never used smokeless tobacco. She reports that she does not drink alcohol and does not use drugs. ? ?Allergies: No Known Allergies ? ?Medications Prior to Admission  ?Medication Sig Dispense Refill  ? albuterol (VENTOLIN HFA) 108 (90 Base) MCG/ACT inhaler Inhale 2 puffs into the lungs every 6 (six) hours as needed for wheezing or shortness of breath. 18 g 5  ? amLODipine (NORVASC) 10 MG tablet Take 1 tablet (10 mg total) by mouth daily. 90 tablet 1  ? apixaban (ELIQUIS) 5 MG TABS tablet Take 1 tablet (5 mg total) by mouth 2 (two) times daily. On 08/12/17 @ 9PM, start 1 tab (5 mg) two times daily. 60 tablet 5  ? atorvastatin (LIPITOR) 80 MG tablet Take 1 tablet (80 mg total) by mouth daily. 90 tablet 3  ? clotrimazole (GYNE-LOTRIMIN 3) 2 % vaginal cream Place 1 Applicatorful vaginally at bedtime. (Patient taking differently: Place 1 Applicatorful vaginally at bedtime as needed (irritation).) 21 g 0  ? cycloSPORINE (RESTASIS) 0.05 % ophthalmic emulsion Place 1 drop into both eyes daily.    ? DULoxetine (CYMBALTA) 60 MG capsule Take 60 mg by mouth daily.    ? empagliflozin (JARDIANCE) 25 MG TABS tablet Take 1 tablet (25 mg total) by mouth  daily before breakfast. 90 tablet 1  ? esomeprazole (NEXIUM) 40 MG capsule TAKE 1 CAPSULE BY MOUTH TWICE DAILY BEFORE MEAL(S) 180 capsule 3  ? furosemide (LASIX) 40 MG tablet Take 1 tablet by mouth once daily 90 tablet 0  ? HYDROcodone-acetaminophen (NORCO) 7.5-325 MG tablet Take 1 tablet by mouth 3 (three) times daily as needed.    ? hydrocortisone (ANUSOL-HC) 2.5 % rectal cream Place 1 application rectally 2 (two) times daily. As needed for itching (Patient taking differently: Place 1 application. rectally 2 (two) times daily as  needed for anal itching.) 30 g 1  ? insulin detemir (LEVEMIR FLEXPEN) 100 UNIT/ML FlexPen Inject 30 Units into the skin daily. 30 mL 3  ? labetalol (NORMODYNE) 100 MG tablet Take 1 tablet (100 mg total) by mouth 2 (two) times daily. 60 tablet 2  ? linaclotide (LINZESS) 290 MCG CAPS capsule Take 1 capsule (290 mcg total) by mouth daily before breakfast. 90 capsule 3  ? lisinopril (ZESTRIL) 40 MG tablet Take 1 tablet by mouth once daily 90 tablet 1  ? metFORMIN (GLUCOPHAGE) 1000 MG tablet Take 1 tablet (1,000 mg total) by mouth 2 (two) times daily with a meal. 60 tablet 2  ? montelukast (SINGULAIR) 10 MG tablet TAKE 1 TABLET BY MOUTH AT BEDTIME 90 tablet 0  ? ondansetron (ZOFRAN-ODT) 4 MG disintegrating tablet Take 4 mg by mouth every 6 (six) hours as needed for nausea or vomiting.    ? potassium chloride (KLOR-CON) 10 MEQ tablet TAKE 1 TABLET BY MOUTH ONCE DAILY IN THE EVENING 90 tablet 0  ? rizatriptan (MAXALT) 10 MG tablet Take 10 mg by mouth daily as needed for migraine.    ? spironolactone (ALDACTONE) 25 MG tablet Take 1 tablet (25 mg total) by mouth daily. 90 tablet 3  ? topiramate (TOPAMAX) 25 MG tablet Take 25 mg by mouth daily.    ? Vitamin D, Ergocalciferol, (DRISDOL) 1.25 MG (50000 UNIT) CAPS capsule Take 1 capsule (50,000 Units total) by mouth once a week. (Patient taking differently: Take 50,000 Units by mouth every Monday.) 8 capsule 0  ? Accu-Chek Softclix Lancets lancets To  test glucose 4 times a day 150 each 2  ? albuterol (PROVENTIL) (2.5 MG/3ML) 0.083% nebulizer solution Take 3 mLs (2.5 mg total) by nebulization every 6 (six) hours as needed for wheezing. 75 mL 12  ? Blood Glucose

## 2021-11-29 NOTE — Anesthesia Procedure Notes (Signed)
Procedure Name: [place] ?Date/Time: 11/29/2021 11:38 AM ?Performed by: Maude Leriche, CRNA ?Pre-anesthesia Checklist: Patient identified, Emergency Drugs available, Suction available, Patient being monitored and Timeout performed ?Patient Re-evaluated:Patient Re-evaluated prior to induction ?Oxygen Delivery Method: Simple face mask ? ? ? ? ?

## 2021-11-29 NOTE — Transfer of Care (Signed)
Immediate Anesthesia Transfer of Care Note ? ?Patient: Sonya James ? ?Procedure(s) Performed: EXCISION OF EXTERNAL EAR LESION (Right) ? ?Patient Location: PACU ? ?Anesthesia Type:MAC ? ?Level of Consciousness: awake, alert  and oriented ? ?Airway & Oxygen Therapy: Patient Spontanous Breathing ? ?Post-op Assessment: Report given to RN, Post -op Vital signs reviewed and stable, Patient moving all extremities X 4 and Patient able to stick tongue midline ? ?Post vital signs: Reviewed ? ?Last Vitals:  ?Vitals Value Taken Time  ?BP 141/71 11/29/21 1221  ?Temp 98.0   ?Pulse 65 11/29/21 1221  ?Resp 16 11/29/21 1221  ?SpO2 100 % 11/29/21 1221  ?Vitals shown include unvalidated device data. ? ?Last Pain:  ?Vitals:  ? 11/29/21 0846  ?TempSrc:   ?PainSc: 0-No pain  ?   ? ?  ? ?Complications: No notable events documented. ?

## 2021-11-30 ENCOUNTER — Other Ambulatory Visit: Payer: Self-pay | Admitting: "Endocrinology

## 2021-11-30 ENCOUNTER — Encounter (HOSPITAL_COMMUNITY): Payer: Self-pay | Admitting: Otolaryngology

## 2021-11-30 ENCOUNTER — Telehealth: Payer: Self-pay | Admitting: Internal Medicine

## 2021-11-30 ENCOUNTER — Other Ambulatory Visit: Payer: Self-pay | Admitting: Internal Medicine

## 2021-11-30 DIAGNOSIS — E1169 Type 2 diabetes mellitus with other specified complication: Secondary | ICD-10-CM

## 2021-11-30 LAB — SURGICAL PATHOLOGY

## 2021-11-30 NOTE — Telephone Encounter (Signed)
Estill Bamberg with [facility] pharm called In on patient behalf  ? ? ? ?Patient has drug interaction between  ? ?spironolactone (ALDACTONE) 25 MG tablet  ? ?And ? ? ?potassium chloride (KLOR-CON) 10 MEQ tablet  ? ? ? ?The Potassium Is being monitored ? ?Call back info  ? ?Estill Bamberg [phone]  ?

## 2021-12-01 NOTE — Telephone Encounter (Signed)
[facility] pharmacy notified

## 2021-12-01 NOTE — Telephone Encounter (Signed)
Patient signed form.  Letter, chart notes, and lab results sent to [place] [phone] ?

## 2021-12-01 NOTE — Telephone Encounter (Addendum)
Appeal letter written.  However patient's plan requires patient to sign authorization for me to submit appeal on her behalf.  ? ?Will fax form to [facility] for patient to sign. Spoke with patient and she is agreeable. ?

## 2021-12-06 ENCOUNTER — Encounter: Payer: Self-pay | Admitting: Orthopaedic Surgery

## 2021-12-06 ENCOUNTER — Other Ambulatory Visit: Payer: Self-pay

## 2021-12-06 ENCOUNTER — Ambulatory Visit (INDEPENDENT_AMBULATORY_CARE_PROVIDER_SITE_OTHER): Payer: Medicaid Other | Admitting: Orthopaedic Surgery

## 2021-12-06 DIAGNOSIS — G8929 Other chronic pain: Secondary | ICD-10-CM | POA: Diagnosis not present

## 2021-12-06 DIAGNOSIS — M25562 Pain in left knee: Secondary | ICD-10-CM

## 2021-12-06 DIAGNOSIS — M25561 Pain in right knee: Secondary | ICD-10-CM

## 2021-12-06 NOTE — Progress Notes (Signed)
PROCEDURE NOTE: ? ?The patient requests injections of the right knee , verbal consent was obtained. ? ?The right knee was prepped appropriately after time out was performed.  ? ?Sterile technique was observed and injection of 1 cc of DepoMedrol '40mg'$  with several cc's of plain xylocaine. Anesthesia was provided by ethyl chloride and a 20-gauge needle was used to inject the knee area. The injection was tolerated well.  A band aid dressing was applied. ? ?The patient was advised to apply ice later today and tomorrow to the injection sight as needed. ? ?PROCEDURE NOTE: ? ?The patient requests injections of the left knee , verbal consent was obtained. ? ?The left knee was prepped appropriately after time out was performed.  ? ?Sterile technique was observed and injection of 1 cc of DepoMedrol 40 mg with several cc's of plain xylocaine. Anesthesia was provided by ethyl chloride and a 20-gauge needle was used to inject the knee area. The injection was tolerated well.  A band aid dressing was applied. ? ?The patient was advised to apply ice later today and tomorrow to the injection sight as needed. ? ?Encounter Diagnosis  ?Name Primary?  ? Chronic pain of both knees Yes  ? ?Return in six weeks. ? ?Call if any problem. ? ?Precautions discussed. ? ?Electronically Signed ?Sanjuana Kava, MD ?3/28/20231:31 PM ? ?

## 2021-12-07 ENCOUNTER — Telehealth (HOSPITAL_COMMUNITY): Payer: Self-pay | Admitting: *Deleted

## 2021-12-07 NOTE — Telephone Encounter (Signed)
Reaching out to patient to offer assistance regarding upcoming cardiac imaging study; pt verbalizes understanding of appt date/time, parking situation and where to check in, and verified current allergies; name and call back number provided for further questions should they arise ? ?Gordy Clement RN Navigator Cardiac Imaging ?[facility] ?[phone] office ?[phone] cell ? ?States she had an MRI in the past before without issue.  Reports some times they have a hard time getting an IV. ?

## 2021-12-09 ENCOUNTER — Ambulatory Visit (HOSPITAL_COMMUNITY)
Admission: RE | Admit: 2021-12-09 | Discharge: 2021-12-09 | Disposition: A | Discharge status: Home / Self Care | Payer: Medicaid Other | Source: Ambulatory Visit | Attending: Internal Medicine | Admitting: Internal Medicine

## 2021-12-09 DIAGNOSIS — I421 Obstructive hypertrophic cardiomyopathy: Secondary | ICD-10-CM

## 2021-12-09 MED ORDER — GADOBUTROL 1 MMOL/ML IV SOLN
10.0000 mL | Freq: Once | INTRAVENOUS | Status: AC | PRN
  Administered 2021-12-09: 10 mL via INTRAVENOUS

## 2021-12-13 DIAGNOSIS — M7989 Other specified soft tissue disorders: Secondary | ICD-10-CM | POA: Diagnosis not present

## 2021-12-20 ENCOUNTER — Telehealth: Payer: Self-pay

## 2021-12-20 ENCOUNTER — Ambulatory Visit: Payer: Medicaid Other

## 2021-12-20 DIAGNOSIS — R011 Cardiac murmur, unspecified: Secondary | ICD-10-CM

## 2021-12-20 NOTE — Telephone Encounter (Signed)
-----   Message from Precious Gilding, RN sent at 12/19/2021  5:07 PM EDT ----- ? ?----- Message ----- ?From: Fay Records, MD ?Sent: 12/19/2021   4:23 PM EDT ?To: [facility] Triage ? ?Reviewed findings with pateint  ?I would like her to wear a 2 wk Zio patch ?I would also loke to see her in clinic so that we can go overall all findings and so I can see how she is doing   [facility] lives in [place]     I would like to see her if possible after monitor is done  ? ?

## 2021-12-20 NOTE — Telephone Encounter (Signed)
Zio monitor ordered to be mailed to patients home. I explained monitor placement to patient.She will call if she has any questions. ?

## 2022-01-16 ENCOUNTER — Encounter: Payer: Self-pay | Admitting: *Deleted

## 2022-01-17 ENCOUNTER — Ambulatory Visit (INDEPENDENT_AMBULATORY_CARE_PROVIDER_SITE_OTHER): Payer: Medicaid Other | Admitting: Orthopaedic Surgery

## 2022-01-17 ENCOUNTER — Encounter: Payer: Self-pay | Admitting: Orthopaedic Surgery

## 2022-01-17 DIAGNOSIS — M25561 Pain in right knee: Secondary | ICD-10-CM

## 2022-01-17 DIAGNOSIS — M25562 Pain in left knee: Secondary | ICD-10-CM

## 2022-01-17 DIAGNOSIS — G8929 Other chronic pain: Secondary | ICD-10-CM

## 2022-01-17 NOTE — Progress Notes (Signed)
PROCEDURE NOTE: ? ?The patient requests injections of the right knee , verbal consent was obtained. ? ?The right knee was prepped appropriately after time out was performed.  ? ?Sterile technique was observed and injection of 1 cc of DepoMedrol '40mg'$  with several cc's of plain xylocaine. Anesthesia was provided by ethyl chloride and a 20-gauge needle was used to inject the knee area. The injection was tolerated well.  A band aid dressing was applied. ? ?The patient was advised to apply ice later today and tomorrow to the injection sight as needed. ?PROCEDURE NOTE: ? ?The patient requests injections of the left knee , verbal consent was obtained. ? ?The left knee was prepped appropriately after time out was performed.  ? ?Sterile technique was observed and injection of 1 cc of DepoMedrol 40 mg with several cc's of plain xylocaine. Anesthesia was provided by ethyl chloride and a 20-gauge needle was used to inject the knee area. The injection was tolerated well.  A band aid dressing was applied. ? ?The patient was advised to apply ice later today and tomorrow to the injection sight as needed. ? ?Encounter Diagnosis  ?Name Primary?  ? Chronic pain of both knees Yes  ? ?Return in six weeks. ?Call if any problem. ? ?Precautions discussed. ? ?Electronically Signed ?Sanjuana Kava, MD ?5/9/20232:04 PM ? ?

## 2022-02-09 ENCOUNTER — Other Ambulatory Visit: Payer: Self-pay | Admitting: Gastroenterology

## 2022-02-09 ENCOUNTER — Other Ambulatory Visit: Payer: Self-pay | Admitting: "Endocrinology

## 2022-02-09 ENCOUNTER — Other Ambulatory Visit: Payer: Self-pay | Admitting: Internal Medicine

## 2022-02-09 DIAGNOSIS — Z794 Long term (current) use of insulin: Secondary | ICD-10-CM

## 2022-02-13 NOTE — Progress Notes (Unsigned)
GI Office Note    Referring Provider: Anabel Halon, MD Primary Care Physician:  Anabel Halon, MD Primary GI: Dr. Marletta Lor  Date:  02/14/2022  ID:  [name], DOB 27-Jan-1966, MRN [id number]   Chief Complaint   Chief Complaint  Patient presents with   Follow-up     History of Present Illness  Sonya James is a 56 y.o. female  with a history of chronic constipation, dysphagia, fatty liver, and GERD. Remote history of pancreatitis in 2013 (CT with possible findings of soft tissue stranding, lipase mildly elevated 111). Ct with contrast May 2019 without abnormalities other than mild hepatic steatosis. She presents today with need to schedule surveillance colonoscopy.   Last colonoscopy 2018 with hyperplastic polyps (Repeat in 5-10 years).   Last EGD 2018 possible lip in the proximal esophagus s/p dilation, moderate gastritis s/p biopsy (pattern of inflammation suggests Helicobacter pylori-related gastritis; however, Warthin-Starry stain is negative).  Last seen in the clinic September 2021 for constipation.  Reportedly doing well Linzess 290 mcg daily.  Did note some rectal itching, no signs of GI bleeding.  GERD was well controlled on Nexium twice daily.  Linzess and Nexium refilled, rectal cream also refilled.   Today: GERD: Has been good. No breakthrough symptoms. No tums or Rolaids. Thinks coughing is from COPD. No lack of appetite, early satiety or unintentional weight loss. Taking BC powders when she has shoulder and knee pain as needed.   Constipation: Has 2 bottles of Linzess at home. Has been going twice a week. Sometimes still have hard stools. Still having hemorrhoids - not using any creams, sometimes itching and irritated. Uses Vaseline sometimes. Coffee and dulcolax work well for her as well.   She denies any change in bowel habits, melena, BRBPR, unintentional weight loss  She reports recently being told that she is cancer free from her endometrial  cancer.  Recently diagnosed with murmur and did a Ziopatch monitor. Not having worsening shortness of breath or chest pain. (Chart review reveals recent cardiac MRI with findings consistent with hypertrophic cardiomyopathy).  Past Medical History:  Diagnosis Date   Anemia    Anxiety    Arthritis    Asthma 05/25/2013   Autonomic neuropathy    Cancer of endometrium (HCC) 10/18/2015   Cellulitis, face 05/03/2021   Constipation    COPD (chronic obstructive pulmonary disease) (HCC)    CTS (carpal tunnel syndrome)    Depression    Diabetes mellitus without complication (HCC)    Dysphagia 12/14/2016   Encounter for screening colonoscopy    Endometrial cancer (HCC) 2017   Gastritis without bleeding    GERD (gastroesophageal reflux disease)    Gross hematuria 05/11/2019   Headache(784.0)    Heavy menses 10/11/2015   HTN (hypertension)    Hypercholesterolemia    Hypothyroidism    IBS (irritable bowel syndrome)    Osteoarthritis    osteoarthritis   Pancreatitis    per patient    Recurrent boils    buttocks and low back.   S/P laparoscopic hysterectomy 11/04/2015   Shortness of breath    Sleep apnea    CPAP machine   Uncontrolled type 2 diabetes mellitus with diabetic polyneuropathy, with long-term current use of insulin 08/04/2017   Uterine cancer (HCC) 2017   Vitamin D deficiency     Past Surgical History:  Procedure Laterality Date   ABDOMINAL HYSTERECTOMY  2018   endometrial cancer, surgery done at [facility]    BIOPSY  01/02/2017   Procedure: BIOPSY;  Surgeon: West Bali, MD;  Location: [facility];  Service: Endoscopy;;  gastric biopsy   CESAREAN SECTION  1989   COLONOSCOPY WITH PROPOFOL N/A 01/02/2017   Dr. Darrick Penna: redundant colon, two 2-3 mm polyps in sigmoid colon (hyperplastic)   EAR CYST EXCISION Right 11/29/2021   Procedure: EXCISION OF EXTERNAL EAR LESION;  Surgeon: Laren Boom, DO;  Location: [facility] OR;  Service: ENT;  Laterality: Right;    ESOPHAGOGASTRODUODENOSCOPY  05/22/2011   Dr. Darrick Penna: H.pylori gastritis    ESOPHAGOGASTRODUODENOSCOPY (EGD) WITH PROPOFOL N/A 01/02/2017   Dr. Darrick Penna: possible web in proximal esophagus s/p dilation, moderate gastritis (negative H.pylori)   FOOT SURGERY Left    tendon repair   IR US GUIDE VASC ACCESS LEFT  01/18/2021   OOPHORECTOMY     OTOPLASATY Left 06/15/2021   Procedure: Excision of left auricular lesion with endaural meatoplasty;  Surgeon: Laren Boom, DO;  Location: [facility] OR;  Service: ENT;  Laterality: Left;   POLYPECTOMY  01/02/2017   Procedure: POLYPECTOMY;  Surgeon: West Bali, MD;  Location: [facility];  Service: Endoscopy;;  sigmoid colon polyps times 2   REDUCTION MAMMAPLASTY  1996   SAVORY DILATION N/A 01/02/2017   Procedure: SAVORY DILATION;  Surgeon: West Bali, MD;  Location: [facility];  Service: Endoscopy;  Laterality: N/A;   TUBAL LIGATION      Current Outpatient Medications  Medication Sig Dispense Refill   Accu-Chek Softclix Lancets lancets To test glucose 4 times a day 150 each 2   albuterol (VENTOLIN HFA) 108 (90 Base) MCG/ACT inhaler Inhale 2 puffs into the lungs every 6 (six) hours as needed for wheezing or shortness of breath. 18 g 5   amLODipine (NORVASC) 10 MG tablet Take 1 tablet (10 mg total) by mouth daily. 90 tablet 1   apixaban (ELIQUIS) 5 MG TABS tablet Take 1 tablet (5 mg total) by mouth 2 (two) times daily. On 08/12/17 @ 9PM, start 1 tab (5 mg) two times daily. 60 tablet 5   atorvastatin (LIPITOR) 80 MG tablet Take 1 tablet (80 mg total) by mouth daily. 90 tablet 3   Blood Glucose Monitoring Suppl (ACCU-CHEK GUIDE ME) w/Device KIT 1 Piece by Does not apply route as directed. 1 kit 0   clotrimazole (GYNE-LOTRIMIN 3) 2 % vaginal cream Place 1 Applicatorful vaginally at bedtime. (Patient taking differently: Place 1 Applicatorful vaginally at bedtime as needed (irritation).) 21 g 0   DULoxetine (CYMBALTA) 60 MG capsule Take 60 mg by mouth  daily.     empagliflozin (JARDIANCE) 25 MG TABS tablet Take 1 tablet (25 mg total) by mouth daily before breakfast. 90 tablet 1   esomeprazole (NEXIUM) 40 MG capsule TAKE 1 CAPSULE BY MOUTH TWICE DAILY BEFORE MEAL(S) 180 capsule 3   furosemide (LASIX) 40 MG tablet Take 1 tablet by mouth once daily 90 tablet 0   glucose blood (ACCU-CHEK GUIDE) test strip Test glucose 4 times a day 150 each 2   insulin detemir (LEVEMIR FLEXPEN) 100 UNIT/ML FlexPen Inject 30 Units into the skin daily. 30 mL 3   Insulin Pen Needle 31G X 5 MM MISC 1 Device by Does not apply route daily in the afternoon. 100 each 2   labetalol (NORMODYNE) 100 MG tablet Take 1 tablet (100 mg total) by mouth 2 (two) times daily. 60 tablet 2   lisinopril (ZESTRIL) 40 MG tablet Take 1 tablet by mouth once daily 90 tablet 1  metFORMIN (GLUCOPHAGE) 1000 MG tablet Take 1 tablet (1,000 mg total) by mouth 2 (two) times daily with a meal. 60 tablet 2   Misc. Devices MISC Blood pressure monitoring device 1 each 0   montelukast (SINGULAIR) 10 MG tablet TAKE 1 TABLET BY MOUTH AT BEDTIME 90 tablet 0   mupirocin ointment (BACTROBAN) 2 % SMARTSIG:1 Application Topical 2-3 Times Daily     ondansetron (ZOFRAN-ODT) 4 MG disintegrating tablet Take 4 mg by mouth every 6 (six) hours as needed for nausea or vomiting.     potassium chloride (KLOR-CON) 10 MEQ tablet TAKE 1 TABLET BY MOUTH ONCE DAILY IN THE EVENING 90 tablet 0   rizatriptan (MAXALT) 10 MG tablet Take 10 mg by mouth daily as needed for migraine.     spironolactone (ALDACTONE) 25 MG tablet Take 1 tablet (25 mg total) by mouth daily. 90 tablet 3   topiramate (TOPAMAX) 25 MG tablet Take 25 mg by mouth daily.     Vitamin D, Ergocalciferol, (DRISDOL) 1.25 MG (50000 UNIT) CAPS capsule Take 1 capsule by mouth once a week 8 capsule 0   albuterol (PROVENTIL) (2.5 MG/3ML) 0.083% nebulizer solution Take 3 mLs (2.5 mg total) by nebulization every 6 (six) hours as needed for wheezing. (Patient not taking:  Reported on 02/14/2022) 75 mL 12   cycloSPORINE (RESTASIS) 0.05 % ophthalmic emulsion Place 1 drop into both eyes daily.     hydrocortisone (ANUSOL-HC) 2.5 % rectal cream Place 1 application rectally 2 (two) times daily. As needed for itching (Patient not taking: Reported on 02/14/2022) 30 g 1   LINZESS 290 MCG CAPS capsule TAKE 1 CAPSULE BY MOUTH ONCE DAILY BEFORE BREAKFAST (Patient not taking: Reported on 02/14/2022) 90 capsule 0   No current facility-administered medications for this visit.    Allergies as of 02/14/2022   (No Known Allergies)    Family History  Problem Relation Age of Onset   Diabetes Mother    Hypertension Mother    COPD Mother    Asthma Mother    Hypercholesterolemia Mother    Hypertension Sister    Hyperlipidemia Sister    Diabetes Sister    Asthma Brother    Alcohol abuse Brother    Asthma Son    Cancer Maternal Grandmother        lung, throat, breast   Alzheimer's disease Maternal Grandmother    Hypertension Maternal Grandmother    Hypercholesterolemia Maternal Grandmother    Heart disease Maternal Grandfather    Stroke Maternal Grandfather    Clotting disorder Maternal Grandfather        blood clots in legs   Hypertension Sister    Hyperlipidemia Sister    Stroke Maternal Aunt    Clotting disorder Maternal Aunt        blood clots in legs   Hypertension Maternal Aunt    Hypercholesterolemia Maternal Aunt    Hypertension Maternal Aunt    Asthma Maternal Aunt    Clotting disorder Maternal Uncle        blood clot -legs traveled to heart and he died of heart attack   Colon cancer Neg Hx     Social History   Socioeconomic History   Marital status: Married    Spouse name: Not on file   Number of children: 1   Years of education: Not on file   Highest education level: Not on file  Occupational History   Not on file  Tobacco Use   Smoking status: Some Days  Packs/day: 1.00    Years: 22.00    Pack years: 22.00    Types: Cigarettes    Smokeless tobacco: Never  Vaping Use   Vaping Use: Never used  Substance and Sexual Activity   Alcohol use: No   Drug use: No   Sexual activity: Yes    Birth control/protection: Surgical    Comment: hysterectomy  Other Topics Concern   Not on file  Social History Narrative   Lives with husband and she has been married to for 9 years.  Recently got evicted and has been living in a motel since last July.   She has 1 son that lives in [place].  Reports one grandbaby      Enjoys watching TV mostly animal [place].      Diet: Eats all food groups   Caffeine: Soda not diet, green tea, coffee about 3 cups of caffeine per day.   Water: Drinks 5-6 8 ounce bottles throughout the day.      Wears a seatbelt.  Currently does not have a vehicle.  Smoke detectors in the facility she is living in.  Does not have any weapons.   Social Determinants of Health   Financial Resource Strain: Not on file  Food Insecurity: Not on file  Transportation Needs: Not on file  Physical Activity: Not on file  Stress: Not on file  Social Connections: Not on file     Review of Systems   Gen: Denies fever, chills, anorexia. Denies fatigue, weakness, weight loss.  CV: Denies chest pain, palpitations, syncope, peripheral edema, and claudication. Resp: Denies dyspnea at rest, cough, wheezing, coughing up blood, and pleurisy. GI: see HPI Derm: Denies rash, itching, dry skin Psych: Denies depression, anxiety, memory loss, confusion. No homicidal or suicidal ideation.  Heme: Denies bruising, bleeding, and enlarged lymph nodes.   Physical Exam   BP 130/72   Pulse 68   Temp 97.6 F (36.4 C) (Temporal)   Ht $R'5\' 5"'ge$  (1.651 m)   Wt 218 lb (98.9 kg)   LMP 09/20/2015 (Exact Date)   BMI 36.28 kg/m   General:   Alert and oriented. No distress noted. Pleasant and cooperative.  Head:  Normocephalic and atraumatic. Eyes:  Conjuctiva clear without scleral icterus. Lungs:  Clear to auscultation bilaterally. No  wheezes, rales, or rhonchi. No distress.  Heart:  S1, S2 present. Slight systolic Murmur present.  Abdomen:  +BS, soft, non-tender and non-distended. No rebound or guarding. No HSM or masses noted. Rectal: deferred Msk:  Symmetrical without gross deformities. Normal posture. Extremities:  Without edema. Neurologic:  Alert and  oriented x4 Psych:  Alert and cooperative. Normal mood and affect.   Assessment  Sonya James is a 56 y.o. female with a history of chronic constipation, GERD, fatty liver, dysphagia, anemia, asthma, COPD, endometrial cancer, diabetes, depression, HTN, hypothyroidism, HLD presenting today for follow-up and need to schedule surveillance colonoscopy.  History of colon polyps: Last colonoscopy in 2018 with hyperplastic polyps.  She denies any changes in her bowel habits.  Denies any melena or hematochezia.  No alarm symptoms present.  Does suffer from chronic constipation that is not well controlled.  She is agreeable to proceed with colonoscopy in the near future.  We discussed medication changes for the procedure.  She will need cardiac clearance to hold Eliquis for 48 hours prior to procedure and clearance to undergo anesthesia as she is currently being worked up for hypertrophic cardiomyopathy and a murmur.  Advised her that she will  be scheduled once she has been further evaluated by cardiology and they clear her to have procedures.  GERD: Well-controlled on Nexium 40 mg twice daily.  Denies frequent breakthrough symptoms.  Refill sent in today.  She does admit to occasional BC powder usage for pain control.  Constipation/hemorrhoids: Has been on Linzess 290 mcg daily.  Reports she is having about 2 bowel movements a week, stools are still hard in nature.  Reports that she has tried all kinds of over-the-counter medications in the past which have not been helpful, states only the Linzess has been most helpful.  Stools are usually [place] 1 or 2.  She did state that  occasionally drinking a cup of coffee and taking Dulcolax is sometimes helpful to produce a bowel movement.  Advised her to begin taking MiraLAX 1 capful daily along with her Linzess to improve her constipation.  She also has associated hemorrhoids that occasionally protrude, not using any over-the-counter regimens or Anusol.  Advised her that she could begin using Preparation H over-the-counter, if she decides that she wants to use the prescription Anusol she is aware to contact the office for refill.  She is not appropriate for hemorrhoid banding given chronic anticoagulation.   PLAN   Proceed with colonoscopy with propofol by Dr. Abbey Chatters: in near future: the risks, benefits, and alternatives have been discussed with the patient in detail. The patient states understanding and desires to proceed. ASA 3 Half normal dose of insulin night prior to procedure. Hold oral diabetes medication night prior and morning of procedure. Hold Eliquis 48 hours prior to procedure, need clearance to hold. Continue Linzess 290 mcg daily, contact office when refills needed. Continue Nexium 40 mg BID, refilled today.  Start miralax 1 capful daily along with Linzess.  Will do 10 mg dulcolax daily for 4 days prior to procedure.  Recommend preparation H over the counter for hemorrhoids, can refill Anusol if this is not helpful. Follow-up 2 to 3 months post procedure.   Venetia Night, MSN, FNP-BC, AGACNP-BC [facility]

## 2022-02-14 ENCOUNTER — Ambulatory Visit (INDEPENDENT_AMBULATORY_CARE_PROVIDER_SITE_OTHER): Payer: Medicaid Other | Admitting: Gastroenterology

## 2022-02-14 ENCOUNTER — Encounter: Payer: Self-pay | Admitting: Gastroenterology

## 2022-02-14 VITALS — BP 130/72 | HR 68 | Temp 97.6°F | Ht 65.0 in | Wt 218.0 lb

## 2022-02-14 DIAGNOSIS — Z86010 Personal history of colonic polyps: Secondary | ICD-10-CM | POA: Diagnosis not present

## 2022-02-14 DIAGNOSIS — K649 Unspecified hemorrhoids: Secondary | ICD-10-CM

## 2022-02-14 DIAGNOSIS — K219 Gastro-esophageal reflux disease without esophagitis: Secondary | ICD-10-CM | POA: Diagnosis not present

## 2022-02-14 DIAGNOSIS — K5904 Chronic idiopathic constipation: Secondary | ICD-10-CM

## 2022-02-14 MED ORDER — ESOMEPRAZOLE MAGNESIUM 40 MG PO CPDR: DELAYED_RELEASE_CAPSULE | ORAL | 3 refills | Status: DC

## 2022-02-14 NOTE — Patient Instructions (Addendum)
We are scheduling you for colonoscopy in the near future with Dr. Abbey Chatters.  -We are reaching out to cardiology to get clearance to schedule your procedure since you are getting your murmur evaluated.  Also to make sure that we can hold her Eliquis for 2 days prior to procedure.  - You should take Dulcolax 10 mg daily for 4 days prior to your procedure  -You should hold all of your oral diabetes medications the night prior to in the morning of your procedure  -You will need to take half your normal dose of insulin the night prior to your procedure (15 units)  For your constipation: Continue taking your Linzess to 290 mcg daily, please let me know when you need a refill. Since you are still having hard stools I want you to begin taking MiraLAX 1 capful daily along with your Linzess. (I am giving you some samples today). For your hemorrhoids you may pick up some Preparation H over-the-counter, if you need the prescription Anusol please reach out to the office and I can refill it.  For your reflux: Continue to avoid trigger foods including fatty/greasy foods and spicy foods.  Continue your Nexium twice daily, I sent in refill today.  It was a pleasure to see you today. I want to create trusting relationships with patients. If you receive a survey regarding your visit,  I greatly appreciate you taking time to fill this out on paper or through your MyChart. I value your feedback.  Venetia Night, MSN, FNP-BC, AGACNP-BC [facility]

## 2022-02-15 ENCOUNTER — Telehealth: Payer: Self-pay | Admitting: *Deleted

## 2022-02-15 NOTE — Telephone Encounter (Signed)
Attention: Preop   We would like to request holding the following medication for patient please.  Procedure: Colonoscopy   Date: TBD  Medication to hold:  Eliquis 48 hours prior to procedure   Surgeon: Dr. Abbey Chatters   Phone: [phone]  Fax:  [phone]  Type of Anesthesia:  Propofol  ASA III

## 2022-02-15 NOTE — Telephone Encounter (Signed)
    Patient Name: Sonya James  DOB: 1966/08/03 MRN: [id number]  Primary Cardiologist: Dorris Carnes, MD  Chart reviewed as part of pre-operative protocol coverage.  Surgical clearance request received with request for recommendations for holding Eliquis prior to colonoscopy. Cardiology does not prescribe patient's Eliquis. Therefore, recommendations on holding Eliquis will need to come from prescribing provider.  I will route this recommendation to the requesting party via Epic fax function and remove from pre-op pool.  Please call with questions.  Lenna Sciara, NP 02/15/2022, 5:57 PM

## 2022-02-15 NOTE — Telephone Encounter (Signed)
Noted. Sorry

## 2022-02-15 NOTE — Telephone Encounter (Signed)
Sent medication clearance. Waiting on approval.

## 2022-02-15 NOTE — Telephone Encounter (Signed)
This is not my patient. She was seen by Venetia Night, NP. Routing to her as FYI.

## 2022-02-16 ENCOUNTER — Telehealth: Payer: Self-pay | Admitting: *Deleted

## 2022-02-16 NOTE — Telephone Encounter (Signed)
Thank you :)

## 2022-02-16 NOTE — Telephone Encounter (Signed)
Sent medication clearance. Waiting for approval. 

## 2022-02-16 NOTE — Telephone Encounter (Signed)
Attention: Preop   We would like to request holding the following medication for patient please.  Procedure: Colonoscopy   Date: TBD  Medication to hold:  Eliquis for 48 hours prior to procedure   Surgeon: Dr. Elisha Ponder  Phone: [phone]  Fax:  [phone]  Type of Anesthesia: Propofol  ASA III  Please call the office if you have any questions or concerns.    Thank Sharol Given  [phone]

## 2022-02-17 NOTE — Telephone Encounter (Signed)
This is Loma Sousa Mahon's patient. I am forwarding to her for review.

## 2022-02-17 NOTE — Telephone Encounter (Signed)
Thank you :)

## 2022-02-17 NOTE — Telephone Encounter (Signed)
Routing to [facility] Clinical Pool.

## 2022-02-17 NOTE — Telephone Encounter (Signed)
Received medication clearance for procedure. Copy placed on providers desk.  

## 2022-02-20 ENCOUNTER — Other Ambulatory Visit: Payer: Self-pay | Admitting: "Endocrinology

## 2022-02-20 ENCOUNTER — Encounter: Payer: Self-pay | Admitting: *Deleted

## 2022-02-20 DIAGNOSIS — E1169 Type 2 diabetes mellitus with other specified complication: Secondary | ICD-10-CM

## 2022-02-20 MED ORDER — CLENPIQ 10-3.5-12 MG-GM -GM/175ML PO SOLN: 1.0000 | ORAL | 0 refills | Status: DC

## 2022-02-20 NOTE — Addendum Note (Signed)
Addended by: Cheron Every on: 02/20/2022 08:54 AM   Modules accepted: Orders

## 2022-02-20 NOTE — Telephone Encounter (Signed)
CALLED PT. She has been scheduled for 7/5 at 10:15am. Aware will mail instructions/pre-op appt. Aware will send prep rx to pharmacy. Hold eliquis 48 hrs.    PA done via [facility]. PA approved. Auth# [id number], DOS: Mar 15, 2022 - Jun 13, 2022

## 2022-02-22 ENCOUNTER — Inpatient Hospital Stay (HOSPITAL_COMMUNITY): Payer: Medicaid Other | Attending: Hematology

## 2022-02-22 DIAGNOSIS — I2782 Chronic pulmonary embolism: Secondary | ICD-10-CM

## 2022-02-22 DIAGNOSIS — Z86711 Personal history of pulmonary embolism: Secondary | ICD-10-CM | POA: Diagnosis present

## 2022-02-22 DIAGNOSIS — F17210 Nicotine dependence, cigarettes, uncomplicated: Secondary | ICD-10-CM | POA: Insufficient documentation

## 2022-02-22 DIAGNOSIS — Z7901 Long term (current) use of anticoagulants: Secondary | ICD-10-CM | POA: Insufficient documentation

## 2022-02-22 LAB — CBC WITH DIFFERENTIAL/PLATELET
Abs Immature Granulocytes: 0.07 10*3/uL (ref 0.00–0.07)
Basophils Absolute: 0 10*3/uL (ref 0.0–0.1)
Basophils Relative: 1 %
Eosinophils Absolute: 0.4 10*3/uL (ref 0.0–0.5)
Eosinophils Relative: 5 %
HCT: 46.8 % — ABNORMAL HIGH (ref 36.0–46.0)
Hemoglobin: 15.1 g/dL — ABNORMAL HIGH (ref 12.0–15.0)
Immature Granulocytes: 1 %
Lymphocytes Relative: 28 %
Lymphs Abs: 2.1 10*3/uL (ref 0.7–4.0)
MCH: 30.6 pg (ref 26.0–34.0)
MCHC: 32.3 g/dL (ref 30.0–36.0)
MCV: 94.7 fL (ref 80.0–100.0)
Monocytes Absolute: 0.4 10*3/uL (ref 0.1–1.0)
Monocytes Relative: 6 %
Neutro Abs: 4.3 10*3/uL (ref 1.7–7.7)
Neutrophils Relative %: 59 %
Platelets: 389 10*3/uL (ref 150–400)
RBC: 4.94 MIL/uL (ref 3.87–5.11)
RDW: 12.5 % (ref 11.5–15.5)
WBC: 7.3 10*3/uL (ref 4.0–10.5)
nRBC: 0 % (ref 0.0–0.2)

## 2022-02-22 LAB — COMPREHENSIVE METABOLIC PANEL
ALT: 12 U/L (ref 0–44)
AST: 13 U/L — ABNORMAL LOW (ref 15–41)
Albumin: 3.6 g/dL (ref 3.5–5.0)
Alkaline Phosphatase: 123 U/L (ref 38–126)
Anion gap: 8 (ref 5–15)
BUN: 14 mg/dL (ref 6–20)
CO2: 22 mmol/L (ref 22–32)
Calcium: 10.1 mg/dL (ref 8.9–10.3)
Chloride: 104 mmol/L (ref 98–111)
Creatinine, Ser: 0.89 mg/dL (ref 0.44–1.00)
GFR, Estimated: >60 mL/min (ref >60)
Glucose, Bld: 429 mg/dL — ABNORMAL HIGH (ref 70–99)
Potassium: 3.6 mmol/L (ref 3.5–5.1)
Sodium: 134 mmol/L — ABNORMAL LOW (ref 135–145)
Total Bilirubin: 0.6 mg/dL (ref 0.3–1.2)
Total Protein: 7.7 g/dL (ref 6.5–8.1)

## 2022-02-22 LAB — D-DIMER, QUANTITATIVE: D-Dimer, Quant: 0.49 ug/mL-FEU (ref 0.00–0.50)

## 2022-02-23 ENCOUNTER — Other Ambulatory Visit: Payer: Self-pay

## 2022-02-23 ENCOUNTER — Telehealth: Payer: Self-pay

## 2022-02-23 DIAGNOSIS — E1169 Type 2 diabetes mellitus with other specified complication: Secondary | ICD-10-CM

## 2022-02-23 MED ORDER — METFORMIN HCL 1000 MG PO TABS: 1000.0000 mg | ORAL_TABLET | Freq: Two times a day (BID) | ORAL | 1 refills | Status: DC

## 2022-02-23 NOTE — Telephone Encounter (Signed)
Patient called said she no longer gets from Dr Dorris Fetch was told to call her primary care doctor   metFORMIN (GLUCOPHAGE) 1000 MG tablet  [facility] [place]

## 2022-02-28 ENCOUNTER — Ambulatory Visit: Payer: Medicaid Other | Admitting: Orthopaedic Surgery

## 2022-02-28 NOTE — Progress Notes (Unsigned)
[facility] [address], [place] [address]   CLINIC:  Medical Oncology/Hematology  PCP:  Anabel Halon, MD [address] [place] [address] [phone]   REASON FOR VISIT:  Follow-up for unprovoked pulmonary embolism  CURRENT THERAPY: Eliquis  INTERVAL HISTORY:  Sonya James 56 y.o. female returns for routine follow-up of her unprovoked pulmonary embolism.  She was last seen by Dr. Ellin Saba on 02/22/2021.    At today's visit, she reports feeling fair.  She has multiple chronic complaints unrelated to today's visit, further detailed in ROS below.  No recent hospitalizations, surgeries, or changes in baseline health status. She is taking Eliquis 5 mg twice daily, but reports that she has had to miss up to a week when she ran out of her medication.  She has not had any major bleeding issues such as hematemesis, hematochezia, melena, or epistaxis.  She denies any unilateral leg swelling, pain, and erythema.  She has some shortness of breath and dyspnea on exertion secondary to COPD and asthma, which is at baseline.  She denies any new cough, chest pain, hemoptysis, or palpitations.  She denies any fevers, chills, night sweats, unintentional weight loss.   She has 25% energy and low appetite. She has been steadily losing weight through diet and exercise.   REVIEW OF SYSTEMS:  Review of Systems  Constitutional:  Positive for fatigue. Negative for appetite change, chills, diaphoresis, fever and unexpected weight change.  HENT:   Negative for lump/mass and nosebleeds.   Eyes:  Negative for eye problems.  Respiratory:  Positive for shortness of breath (COPD and asthma). Negative for cough and hemoptysis.   Cardiovascular:  Negative for chest pain, leg swelling and palpitations.  Gastrointestinal:  Positive for constipation. Negative for abdominal pain, blood in stool, diarrhea, nausea and vomiting.  Genitourinary:  Negative for hematuria.   Musculoskeletal:   Positive for arthralgias.  Skin: Negative.   Neurological:  Positive for headaches. Negative for dizziness and light-headedness.  Hematological:  Does not bruise/bleed easily.      PAST MEDICAL/SURGICAL HISTORY:  Past Medical History:  Diagnosis Date   Anemia    Anxiety    Arthritis    Asthma 05/25/2013   Autonomic neuropathy    Cancer of endometrium (HCC) 10/18/2015   Cellulitis, face 05/03/2021   Constipation    COPD (chronic obstructive pulmonary disease) (HCC)    CTS (carpal tunnel syndrome)    Depression    Diabetes mellitus without complication (HCC)    Dysphagia 12/14/2016   Encounter for screening colonoscopy    Endometrial cancer (HCC) 2017   Gastritis without bleeding    GERD (gastroesophageal reflux disease)    Gross hematuria 05/11/2019   Headache(784.0)    Heavy menses 10/11/2015   HTN (hypertension)    Hypercholesterolemia    Hypothyroidism    IBS (irritable bowel syndrome)    Osteoarthritis    osteoarthritis   Pancreatitis    per patient    Recurrent boils    buttocks and low back.   S/P laparoscopic hysterectomy 11/04/2015   Shortness of breath    Sleep apnea    CPAP machine   Uncontrolled type 2 diabetes mellitus with diabetic polyneuropathy, with long-term current use of insulin 08/04/2017   Uterine cancer (HCC) 2017   Vitamin D deficiency    Past Surgical History:  Procedure Laterality Date   ABDOMINAL HYSTERECTOMY  2018   endometrial cancer, surgery done at [facility]    BIOPSY  01/02/2017   Procedure: BIOPSY;  Surgeon: Danie Binder, MD;  Location: [facility];  Service: Endoscopy;;  gastric biopsy   CESAREAN SECTION  1989   COLONOSCOPY WITH PROPOFOL N/A 01/02/2017   Dr. Oneida Alar: redundant colon, two 2-3 mm polyps in sigmoid colon (hyperplastic)   EAR CYST EXCISION Right 11/29/2021   Procedure: EXCISION OF EXTERNAL EAR LESION;  Surgeon: Jason Coop, DO;  Location: [place];  Service: ENT;  Laterality: Right;    ESOPHAGOGASTRODUODENOSCOPY  05/22/2011   Dr. Oneida Alar: H.pylori gastritis    ESOPHAGOGASTRODUODENOSCOPY (EGD) WITH PROPOFOL N/A 01/02/2017   Dr. 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 s/p dilation, moderate gastritis (negative H.pylori)   FOOT SURGERY Left    tendon repair   IR US GUIDE VASC ACCESS LEFT  01/18/2021   OOPHORECTOMY     OTOPLASATY Left 06/15/2021   Procedure: Excision of left auricular lesion with endaural meatoplasty;  Surgeon: Jason Coop, DO;  Location: [place];  Service: ENT;  Laterality: Left;   POLYPECTOMY  01/02/2017   Procedure: POLYPECTOMY;  Surgeon: Danie Binder, MD;  [facility];  Service: Endoscopy;;  sigmoid colon polyps times 2   REDUCTION MAMMAPLASTY  1996   SAVORY DILATION N/A 01/02/2017   Procedure: SAVORY DILATION;  Surgeon: Danie Binder, MD;  [facility];  Service: Endoscopy;  Laterality: N/A;   TUBAL LIGATION       SOCIAL HISTORY:  Social History   Socioeconomic History   Marital status: Married    Spouse name: Not on file   Number of children: 1   Years of education: Not on file   Highest education level: Not on file  Occupational History   Not on file  Tobacco Use   Smoking status: Some Days    Packs/day: 1.00    Years: 22.00    Total pack years: 22.00    Types: Cigarettes   Smokeless tobacco: Never  Vaping Use   Vaping Use: Never used  Substance and Sexual Activity   Alcohol use: No   Drug use: No   Sexual activity: Yes    Birth control/protection: Surgical    Comment: hysterectomy  Other Topics Concern   Not on file  Social History Narrative   Lives with husband and she has been married to for 9 years.  Recently got evicted and has been living in a motel since last July.   She has 1 son that lives in [place].  Reports one grandbaby      Enjoys watching TV mostly animal [place].      Diet: Eats all food groups   Caffeine: Soda not diet, green tea, coffee about 3 cups of caffeine per day.    Water: Drinks 5-6 8 ounce bottles throughout the day.      Wears a seatbelt.  Currently does not have a vehicle.  Smoke detectors in the facility she is living in.  Does not have any weapons.   Social Determinants of Health   Financial Resource Strain: Low Risk  (03/25/2020)   Overall Financial Resource Strain (CARDIA)    Difficulty of Paying Living Expenses: Not hard at all  Food Insecurity: No Food Insecurity (03/25/2020)   Hunger Vital Sign    Worried About Running Out of Food in the Last Year: Never true    Ran Out of Food in the Last Year: Never true  Transportation Needs: Unmet Transportation Needs (03/25/2020)   PRAPARE - Transportation    Lack of  Transportation (Medical): Yes    Lack of Transportation (Non-Medical): Yes  Physical Activity: Insufficiently Active (03/25/2020)   Exercise Vital Sign    Days of Exercise per Week: 3 days    Minutes of Exercise per Session: 20 min  Stress: No Stress Concern Present (03/25/2020)   [place]    Feeling of Stress : Not at all  Social Connections: Moderately Isolated (03/25/2020)   Social Connection and Isolation Panel [NHANES]    Frequency of Communication with Friends and Family: More than three times a week    Frequency of Social Gatherings with Friends and Family: More than three times a week    Attends Religious Services: 1 to 4 times per year    Active Member of [employer] or Organizations: No    Attends Archivist Meetings: Never    Marital Status: Separated  Intimate Partner Violence: Not At Risk (03/25/2020)   Humiliation, Afraid, Rape, and Kick questionnaire    Fear of Current or Ex-Partner: No    Emotionally Abused: No    Physically Abused: No    Sexually Abused: No    FAMILY HISTORY:  Family History  Problem Relation Age of Onset   Diabetes Mother    Hypertension Mother    COPD Mother    Asthma Mother    Hypercholesterolemia Mother     Hypertension Sister    Hyperlipidemia Sister    Diabetes Sister    Asthma Brother    Alcohol abuse Brother    Asthma Son    Cancer Maternal Grandmother        lung, throat, breast   Alzheimer's disease Maternal Grandmother    Hypertension Maternal Grandmother    Hypercholesterolemia Maternal Grandmother    Heart disease Maternal Grandfather    Stroke Maternal Grandfather    Clotting disorder Maternal Grandfather        blood clots in legs   Hypertension Sister    Hyperlipidemia Sister    Stroke Maternal Aunt    Clotting disorder Maternal Aunt        blood clots in legs   Hypertension Maternal Aunt    Hypercholesterolemia Maternal Aunt    Hypertension Maternal Aunt    Asthma Maternal Aunt    Clotting disorder Maternal Uncle        blood clot -legs traveled to heart and he died of heart attack   Colon cancer Neg Hx     CURRENT MEDICATIONS:  Outpatient Encounter Medications as of 03/01/2022  Medication Sig   Accu-Chek Softclix Lancets lancets To test glucose 4 times a day   albuterol (VENTOLIN HFA) 108 (90 Base) MCG/ACT inhaler Inhale 2 puffs into the lungs every 6 (six) hours as needed for wheezing or shortness of breath.   amLODipine (NORVASC) 10 MG tablet Take 1 tablet (10 mg total) by mouth daily.   apixaban (ELIQUIS) 5 MG TABS tablet Take 1 tablet (5 mg total) by mouth 2 (two) times daily. On 08/12/17 @ 9PM, start 1 tab (5 mg) two times daily.   atorvastatin (LIPITOR) 80 MG tablet Take 1 tablet (80 mg total) by mouth daily.   Blood Glucose Monitoring Suppl (ACCU-CHEK GUIDE ME) w/Device KIT 1 Piece by Does not apply route as directed.   clotrimazole (GYNE-LOTRIMIN 3) 2 % vaginal cream Place 1 Applicatorful vaginally at bedtime. (Patient taking differently: Place 1 Applicatorful vaginally at bedtime as needed (irritation).)   cycloSPORINE (RESTASIS) 0.05 % ophthalmic emulsion Place 1 drop into  both eyes daily.   DULoxetine (CYMBALTA) 60 MG capsule Take 60 mg by mouth daily.    empagliflozin (JARDIANCE) 25 MG TABS tablet Take 1 tablet (25 mg total) by mouth daily before breakfast.   esomeprazole (NEXIUM) 40 MG capsule TAKE 1 CAPSULE BY MOUTH TWICE DAILY BEFORE MEAL(S)   furosemide (LASIX) 40 MG tablet Take 1 tablet by mouth once daily   glucose blood (ACCU-CHEK GUIDE) test strip Test glucose 4 times a day   insulin detemir (LEVEMIR FLEXPEN) 100 UNIT/ML FlexPen Inject 30 Units into the skin daily.   Insulin Pen Needle 31G X 5 MM MISC 1 Device by Does not apply route daily in the afternoon.   labetalol (NORMODYNE) 100 MG tablet Take 1 tablet (100 mg total) by mouth 2 (two) times daily.   LINZESS 290 MCG CAPS capsule TAKE 1 CAPSULE BY MOUTH ONCE DAILY BEFORE BREAKFAST   lisinopril (ZESTRIL) 40 MG tablet Take 1 tablet by mouth once daily   metFORMIN (GLUCOPHAGE) 1000 MG tablet Take 1 tablet (1,000 mg total) by mouth 2 (two) times daily with a meal.   Misc. Devices MISC Blood pressure monitoring device   montelukast (SINGULAIR) 10 MG tablet TAKE 1 TABLET BY MOUTH AT BEDTIME   mupirocin ointment (BACTROBAN) 2 % SMARTSIG:1 Application Topical 2-3 Times Daily   ondansetron (ZOFRAN-ODT) 4 MG disintegrating tablet Take 4 mg by mouth every 6 (six) hours as needed for nausea or vomiting.   potassium chloride (KLOR-CON) 10 MEQ tablet TAKE 1 TABLET BY MOUTH ONCE DAILY IN THE EVENING   rizatriptan (MAXALT) 10 MG tablet Take 10 mg by mouth daily as needed for migraine.   Sod Picosulfate-Mag Ox-Cit Acd (CLENPIQ) 10-3.5-12 MG-GM -GM/175ML SOLN Take 1 kit by mouth as directed.   spironolactone (ALDACTONE) 25 MG tablet Take 1 tablet (25 mg total) by mouth daily.   topiramate (TOPAMAX) 25 MG tablet Take 25 mg by mouth daily.   Vitamin D, Ergocalciferol, (DRISDOL) 1.25 MG (50000 UNIT) CAPS capsule Take 1 capsule by mouth once a week   No facility-administered encounter medications on file as of 03/01/2022.    ALLERGIES:  No Known Allergies   PHYSICAL EXAM:  ECOG PERFORMANCE STATUS: 1  - Symptomatic but completely ambulatory  There were no vitals filed for this visit. There were no vitals filed for this visit. Physical Exam Constitutional:      Appearance: Normal appearance. She is obese.  HENT:     Head: Normocephalic and atraumatic.     Mouth/Throat:     Mouth: Mucous membranes are moist.  Eyes:     Extraocular Movements: Extraocular movements intact.     Pupils: Pupils are equal, round, and reactive to light.  Cardiovascular:     Rate and Rhythm: Normal rate and regular rhythm.     Pulses: Normal pulses.     Heart sounds: Normal heart sounds.  Pulmonary:     Effort: Pulmonary effort is normal.     Breath sounds: Normal breath sounds.  Abdominal:     General: Bowel sounds are normal.     Palpations: Abdomen is soft.     Tenderness: There is no abdominal tenderness.  Musculoskeletal:        General: No swelling.     Right lower leg: No edema.     Left lower leg: No edema.  Lymphadenopathy:     Cervical: No cervical adenopathy.  Skin:    General: Skin is warm and dry.  Neurological:     General: No  focal deficit present.     Mental Status: She is alert and oriented to person, place, and time.  Psychiatric:        Mood and Affect: Mood normal.        Behavior: Behavior normal.      LABORATORY DATA:  I have reviewed the labs as listed.  CBC    Component Value Date/Time   WBC 7.3 02/22/2022 1219   RBC 4.94 02/22/2022 1219   HGB 15.1 (H) 02/22/2022 1219   HGB 15.6 11/01/2020 0845   HCT 46.8 (H) 02/22/2022 1219   HCT 47.4 (H) 11/01/2020 0845   PLT 389 02/22/2022 1219   PLT 388 11/01/2020 0845   MCV 94.7 02/22/2022 1219   MCV 92 11/01/2020 0845   MCH 30.6 02/22/2022 1219   MCHC 32.3 02/22/2022 1219   RDW 12.5 02/22/2022 1219   RDW 11.4 (L) 11/01/2020 0845   LYMPHSABS 2.1 02/22/2022 1219   LYMPHSABS 4.3 (H) 11/01/2020 0845   MONOABS 0.4 02/22/2022 1219   EOSABS 0.4 02/22/2022 1219   EOSABS 0.2 11/01/2020 0845   BASOSABS 0.0 02/22/2022  1219   BASOSABS 0.0 11/01/2020 0845      Latest Ref Rng & Units 02/22/2022   12:19 PM 11/29/2021    8:32 AM 10/21/2021   10:52 AM  CMP  Glucose 70 - 99 mg/dL 429  258  191   BUN 6 - 20 mg/dL $Remove'14  12  18   'WOYaifG$ Creatinine 0.44 - 1.00 mg/dL 0.89  0.62  0.78   Sodium 135 - 145 mmol/L 134  138  134   Potassium 3.5 - 5.1 mmol/L 3.6  4.3  4.2   Chloride 98 - 111 mmol/L 104  108  102   CO2 22 - 32 mmol/L $RemoveB'22  27  26   'hKjNAQoI$ Calcium 8.9 - 10.3 mg/dL 10.1  9.5  10.2   Total Protein 6.5 - 8.1 g/dL 7.7     Total Bilirubin 0.3 - 1.2 mg/dL 0.6     Alkaline Phos 38 - 126 U/L 123     AST 15 - 41 U/L 13     ALT 0 - 44 U/L 12       DIAGNOSTIC IMAGING:  I have independently reviewed the relevant imaging and discussed with the patient.  ASSESSMENT & PLAN: 1.  Unprovoked pulmonary embolism: - Diagnosed in November 2018, on Eliquis 5 mg twice daily, without any bleeding complications. - She did not have any risk factors for pulmonary embolism. - Her blood work was negative for lupus anticoagulant, anticardiolipin antibodies, antibeta-2 glycoprotein 1 antibodies.  It was also negative for other tests like antithrombin III, [place] [address] a mutation, protein C and protein S deficiencies. - CT CAP in May 2019 was negative for any metastatic disease.  This was done because of her history of endometrial cancer.   - As she had unprovoked pulmonary embolism, her chance of recurrent DVT is about 8% at year 1 and about 40% at year 5.  At this time the benefits of continued anticoagulation outweigh the risk.  Hence we have recommended indefinite anticoagulation.  Patient is agreeable to this option. - She is continuing to tolerate Eliquis without any bleeding issues. - Most recent labs (02/22/2022) show D-dimer negative, normal renal function, and no anemia. - No current symptoms concerning for recurrent DVT or PE - PLAN: At this time, benefits of anticoagulation outweigh risks.  Continue Eliquis.  We will  reevaluate risk-benefit ratio in 1 year.  2.  Endometrial cancer: - She underwent TLH, BSO, bilateral sentinel lymph node dissection on 11/04/2015 at [facility]. - It was a stage Ia, grade 1-low risk.   All questions were answered. The patient knows to call the clinic with any problems, questions or concerns.  Medical decision making: Low  Time spent on visit: I spent 15 minutes counseling the patient face to face. The total time spent in the appointment was 23 minutes and more than 50% was on counseling.   Harriett Rush, PA-C  03/01/2022 11:20 AM

## 2022-03-01 ENCOUNTER — Encounter (HOSPITAL_COMMUNITY): Payer: Self-pay | Admitting: Physician Assistant

## 2022-03-01 ENCOUNTER — Inpatient Hospital Stay (HOSPITAL_BASED_OUTPATIENT_CLINIC_OR_DEPARTMENT_OTHER): Payer: Medicaid Other | Admitting: Physician Assistant

## 2022-03-01 VITALS — BP 130/78 | HR 75 | Temp 97.8°F | Resp 18 | Ht 65.0 in | Wt 205.9 lb

## 2022-03-01 DIAGNOSIS — I2782 Chronic pulmonary embolism: Secondary | ICD-10-CM | POA: Diagnosis not present

## 2022-03-01 DIAGNOSIS — Z86711 Personal history of pulmonary embolism: Secondary | ICD-10-CM | POA: Diagnosis not present

## 2022-03-03 DIAGNOSIS — H5213 Myopia, bilateral: Secondary | ICD-10-CM | POA: Diagnosis not present

## 2022-03-06 ENCOUNTER — Encounter: Payer: Self-pay | Admitting: Internal Medicine

## 2022-03-06 ENCOUNTER — Ambulatory Visit (INDEPENDENT_AMBULATORY_CARE_PROVIDER_SITE_OTHER): Payer: Medicaid Other | Admitting: Internal Medicine

## 2022-03-06 ENCOUNTER — Other Ambulatory Visit (HOSPITAL_COMMUNITY)
Admission: RE | Admit: 2022-03-06 | Discharge: 2022-03-06 | Disposition: A | Discharge status: Home / Self Care | Payer: Medicaid Other | Source: Ambulatory Visit | Attending: Internal Medicine | Admitting: Internal Medicine

## 2022-03-06 VITALS — BP 142/80 | Resp 18 | Ht 65.0 in | Wt 207.0 lb

## 2022-03-06 DIAGNOSIS — Z1159 Encounter for screening for other viral diseases: Secondary | ICD-10-CM | POA: Diagnosis not present

## 2022-03-06 DIAGNOSIS — Z0001 Encounter for general adult medical examination with abnormal findings: Secondary | ICD-10-CM | POA: Diagnosis not present

## 2022-03-06 DIAGNOSIS — L304 Erythema intertrigo: Secondary | ICD-10-CM

## 2022-03-06 DIAGNOSIS — B3731 Acute candidiasis of vulva and vagina: Secondary | ICD-10-CM

## 2022-03-06 DIAGNOSIS — N898 Other specified noninflammatory disorders of vagina: Secondary | ICD-10-CM | POA: Insufficient documentation

## 2022-03-06 DIAGNOSIS — E1165 Type 2 diabetes mellitus with hyperglycemia: Secondary | ICD-10-CM

## 2022-03-06 DIAGNOSIS — Z794 Long term (current) use of insulin: Secondary | ICD-10-CM | POA: Diagnosis not present

## 2022-03-06 DIAGNOSIS — I10 Essential (primary) hypertension: Secondary | ICD-10-CM | POA: Diagnosis not present

## 2022-03-06 DIAGNOSIS — E782 Mixed hyperlipidemia: Secondary | ICD-10-CM | POA: Diagnosis not present

## 2022-03-06 MED ORDER — AMLODIPINE BESYLATE 10 MG PO TABS: 10.0000 mg | ORAL_TABLET | Freq: Every day | ORAL | 1 refills | Status: DC

## 2022-03-06 MED ORDER — LISINOPRIL 40 MG PO TABS: 40.0000 mg | ORAL_TABLET | Freq: Every day | ORAL | 1 refills | Status: DC

## 2022-03-06 MED ORDER — LABETALOL HCL 100 MG PO TABS: 100.0000 mg | ORAL_TABLET | Freq: Two times a day (BID) | ORAL | 1 refills | Status: DC

## 2022-03-06 MED ORDER — KETOCONAZOLE 2 % EX CREA: 1.0000 | TOPICAL_CREAM | Freq: Every day | CUTANEOUS | 0 refills | Status: DC

## 2022-03-06 NOTE — Assessment & Plan Note (Signed)

## 2022-03-06 NOTE — Progress Notes (Signed)
Established Patient Office Visit  Subjective:  Patient ID: Sonya James, female    DOB: 01-15-1966  Age: 56 y.o. MRN: [id number]  CC:  Chief Complaint  Patient presents with   Annual Exam    Annual exam thinks she has yeast infection since 02-22-22 she has been itching and burning     HPI Sonya James is a 56 y.o. female with past medical history of HTN, type II DM, HLD, COPD, OSA, PE, GERD, obesity and tobacco abuse who presents for annual physical.  HTN: Her BP was elevated upon multiple measurements.  Of note, she has not taken her medications today.  She has history of noncompliance to her medications.  She denies any headache, dizziness, chest pain, dyspnea or palpitations today.  Type II DM with HLD: She is on Lantus 30 U qHS now.  She is also placed on Jardiance and continues to take metformin currently.  Her Hgb A1c had improved to 8.2.  Denies any polyuria or polydipsia currently.  Followed by endocrinology.  She complains of perineal area itching, but denies any vaginal discharge currently.  Denies any fever, chills, dysuria or hematuria currently.    Past Medical History:  Diagnosis Date   Anemia    Anxiety    Arthritis    Asthma 05/25/2013   Autonomic neuropathy    Cancer of endometrium (HCC) 10/18/2015   Cellulitis, face 05/03/2021   Constipation    COPD (chronic obstructive pulmonary disease) (HCC)    CTS (carpal tunnel syndrome)    Depression    Diabetes mellitus without complication (HCC)    Dysphagia 12/14/2016   Encounter for screening colonoscopy    Endometrial cancer (HCC) 2017   Gastritis without bleeding    GERD (gastroesophageal reflux disease)    Gross hematuria 05/11/2019   Headache(784.0)    Heavy menses 10/11/2015   HTN (hypertension)    Hypercholesterolemia    Hypothyroidism    IBS (irritable bowel syndrome)    Osteoarthritis    osteoarthritis   Pancreatitis    per patient    Recurrent boils    buttocks and low back.   S/P  laparoscopic hysterectomy 11/04/2015   Shortness of breath    Sleep apnea    CPAP machine   Uncontrolled type 2 diabetes mellitus with diabetic polyneuropathy, with long-term current use of insulin 08/04/2017   Uterine cancer (HCC) 2017   Vitamin D deficiency     Past Surgical History:  Procedure Laterality Date   ABDOMINAL HYSTERECTOMY  2018   endometrial cancer, surgery done at [facility]    BIOPSY  01/02/2017   Procedure: BIOPSY;  Surgeon: West Bali, MD;  Location: [facility];  Service: Endoscopy;;  gastric biopsy   CESAREAN SECTION  1989   COLONOSCOPY WITH PROPOFOL N/A 01/02/2017   Dr. Darrick Penna: redundant colon, two 2-3 mm polyps in sigmoid colon (hyperplastic)   EAR CYST EXCISION Right 11/29/2021   Procedure: EXCISION OF EXTERNAL EAR LESION;  Surgeon: Laren Boom, DO;  Location: [facility] OR;  Service: ENT;  Laterality: Right;   ESOPHAGOGASTRODUODENOSCOPY  05/22/2011   Dr. Darrick Penna: H.pylori gastritis    ESOPHAGOGASTRODUODENOSCOPY (EGD) WITH PROPOFOL N/A 01/02/2017   Dr. Darrick Penna: possible web in proximal esophagus s/p dilation, moderate gastritis (negative H.pylori)   FOOT SURGERY Left    tendon repair   IR US GUIDE VASC ACCESS LEFT  01/18/2021   OOPHORECTOMY     OTOPLASATY Left 06/15/2021   Procedure: Excision of left auricular lesion with  endaural meatoplasty;  Surgeon: Laren Boom, DO;  Location: [facility] OR;  Service: ENT;  Laterality: Left;   POLYPECTOMY  01/02/2017   Procedure: POLYPECTOMY;  Surgeon: West Bali, MD;  Location: [facility];  Service: Endoscopy;;  sigmoid colon polyps times 2   REDUCTION MAMMAPLASTY  1996   SAVORY DILATION N/A 01/02/2017   Procedure: SAVORY DILATION;  Surgeon: West Bali, MD;  Location: [facility];  Service: Endoscopy;  Laterality: N/A;   TUBAL LIGATION      Family History  Problem Relation Age of Onset   Diabetes Mother    Hypertension Mother    COPD Mother    Asthma Mother    Hypercholesterolemia Mother     Hypertension Sister    Hyperlipidemia Sister    Diabetes Sister    Asthma Brother    Alcohol abuse Brother    Asthma Son    Cancer Maternal Grandmother        lung, throat, breast   Alzheimer's disease Maternal Grandmother    Hypertension Maternal Grandmother    Hypercholesterolemia Maternal Grandmother    Heart disease Maternal Grandfather    Stroke Maternal Grandfather    Clotting disorder Maternal Grandfather        blood clots in legs   Hypertension Sister    Hyperlipidemia Sister    Stroke Maternal Aunt    Clotting disorder Maternal Aunt        blood clots in legs   Hypertension Maternal Aunt    Hypercholesterolemia Maternal Aunt    Hypertension Maternal Aunt    Asthma Maternal Aunt    Clotting disorder Maternal Uncle        blood clot -legs traveled to heart and he died of heart attack   Colon cancer Neg Hx     Social History   Socioeconomic History   Marital status: Married    Spouse name: Not on file   Number of children: 1   Years of education: Not on file   Highest education level: Not on file  Occupational History   Not on file  Tobacco Use   Smoking status: Some Days    Packs/day: 1.00    Years: 22.00    Total pack years: 22.00    Types: Cigarettes   Smokeless tobacco: Never  Vaping Use   Vaping Use: Never used  Substance and Sexual Activity   Alcohol use: No   Drug use: No   Sexual activity: Yes    Birth control/protection: Surgical    Comment: hysterectomy  Other Topics Concern   Not on file  Social History Narrative   Lives with husband and she has been married to for 9 years.  Recently got evicted and has been living in a motel since last July.   She has 1 son that lives in [place].  Reports one grandbaby      Enjoys watching TV mostly animal [place].      Diet: Eats all food groups   Caffeine: Soda not diet, green tea, coffee about 3 cups of caffeine per day.   Water: Drinks 5-6 8 ounce bottles throughout the day.      Wears a  seatbelt.  Currently does not have a vehicle.  Smoke detectors in the facility she is living in.  Does not have any weapons.   Social Determinants of Health   Financial Resource Strain: Low Risk  (03/25/2020)   Overall Financial Resource Strain (CARDIA)    Difficulty of Paying Living  Expenses: Not hard at all  Food Insecurity: No Food Insecurity (03/25/2020)   Hunger Vital Sign    Worried About Running Out of Food in the Last Year: Never true    Ran Out of Food in the Last Year: Never true  Transportation Needs: Unmet Transportation Needs (03/25/2020)   PRAPARE - Transportation    Lack of Transportation (Medical): Yes    Lack of Transportation (Non-Medical): Yes  Physical Activity: Insufficiently Active (03/25/2020)   Exercise Vital Sign    Days of Exercise per Week: 3 days    Minutes of Exercise per Session: 20 min  Stress: No Stress Concern Present (03/25/2020)   [employer] of Occupational Health - Occupational Stress Questionnaire    Feeling of Stress : Not at all  Social Connections: Moderately Isolated (03/25/2020)   Social Connection and Isolation Panel [NHANES]    Frequency of Communication with Friends and Family: More than three times a week    Frequency of Social Gatherings with Friends and Family: More than three times a week    Attends Religious Services: 1 to 4 times per year    Active Member of [employer] or Organizations: No    Attends Banker Meetings: Never    Marital Status: Separated  Intimate Partner Violence: Not At Risk (03/25/2020)   Humiliation, Afraid, Rape, and Kick questionnaire    Fear of Current or Ex-Partner: No    Emotionally Abused: No    Physically Abused: No    Sexually Abused: No    Outpatient Medications Prior to Visit  Medication Sig Dispense Refill   Accu-Chek Softclix Lancets lancets To test glucose 4 times a day 150 each 2   albuterol (VENTOLIN HFA) 108 (90 Base) MCG/ACT inhaler Inhale 2 puffs into the lungs every 6 (six) hours  as needed for wheezing or shortness of breath. 18 g 5   apixaban (ELIQUIS) 5 MG TABS tablet Take 1 tablet (5 mg total) by mouth 2 (two) times daily. On 08/12/17 @ 9PM, start 1 tab (5 mg) two times daily. 60 tablet 5   atorvastatin (LIPITOR) 80 MG tablet Take 1 tablet (80 mg total) by mouth daily. 90 tablet 3   Blood Glucose Monitoring Suppl (ACCU-CHEK GUIDE ME) w/Device KIT 1 Piece by Does not apply route as directed. 1 kit 0   clotrimazole (GYNE-LOTRIMIN 3) 2 % vaginal cream Place 1 Applicatorful vaginally at bedtime. 21 g 0   cycloSPORINE (RESTASIS) 0.05 % ophthalmic emulsion Place 1 drop into both eyes 2 (two) times daily.     DULoxetine (CYMBALTA) 60 MG capsule Take 60 mg by mouth daily.     empagliflozin (JARDIANCE) 25 MG TABS tablet Take 1 tablet (25 mg total) by mouth daily before breakfast. 90 tablet 1   esomeprazole (NEXIUM) 40 MG capsule TAKE 1 CAPSULE BY MOUTH TWICE DAILY BEFORE MEAL(S) 180 capsule 3   furosemide (LASIX) 40 MG tablet Take 1 tablet by mouth once daily 90 tablet 0   glucose blood (ACCU-CHEK GUIDE) test strip Test glucose 4 times a day 150 each 2   insulin detemir (LEVEMIR FLEXPEN) 100 UNIT/ML FlexPen Inject 30 Units into the skin daily. 30 mL 3   Insulin Pen Needle 31G X 5 MM MISC 1 Device by Does not apply route daily in the afternoon. 100 each 2   LINZESS 290 MCG CAPS capsule TAKE 1 CAPSULE BY MOUTH ONCE DAILY BEFORE BREAKFAST 90 capsule 0   metFORMIN (GLUCOPHAGE) 1000 MG tablet Take 1 tablet (1,000 mg total)  by mouth 2 (two) times daily with a meal. 90 tablet 1   Misc. Devices MISC Blood pressure monitoring device 1 each 0   montelukast (SINGULAIR) 10 MG tablet TAKE 1 TABLET BY MOUTH AT BEDTIME 90 tablet 0   ondansetron (ZOFRAN-ODT) 4 MG disintegrating tablet Take 4 mg by mouth every 6 (six) hours as needed for nausea or vomiting.     potassium chloride (KLOR-CON) 10 MEQ tablet TAKE 1 TABLET BY MOUTH ONCE DAILY IN THE EVENING 90 tablet 0   rizatriptan (MAXALT) 10 MG  tablet Take 10 mg by mouth daily as needed for migraine.     Sod Picosulfate-Mag Ox-Cit Acd (CLENPIQ) 10-3.5-12 MG-GM -GM/175ML SOLN Take 1 kit by mouth as directed. 350 mL 0   topiramate (TOPAMAX) 25 MG tablet Take 50 mg by mouth at bedtime.     Vitamin D, Ergocalciferol, (DRISDOL) 1.25 MG (50000 UNIT) CAPS capsule Take 1 capsule by mouth once a week 8 capsule 0   amLODipine (NORVASC) 10 MG tablet Take 1 tablet (10 mg total) by mouth daily. 90 tablet 1   labetalol (NORMODYNE) 100 MG tablet Take 1 tablet (100 mg total) by mouth 2 (two) times daily. 60 tablet 2   lisinopril (ZESTRIL) 40 MG tablet Take 1 tablet by mouth once daily 90 tablet 1   spironolactone (ALDACTONE) 25 MG tablet Take 1 tablet (25 mg total) by mouth daily. 90 tablet 3   No facility-administered medications prior to visit.    No Known Allergies  ROS Review of Systems  Constitutional:  Negative for chills and fever.  HENT:  Negative for congestion, sinus pressure, sinus pain and sore throat.   Eyes:  Negative for pain and discharge.  Respiratory:  Negative for cough and shortness of breath.   Cardiovascular:  Negative for chest pain and palpitations.  Gastrointestinal:  Negative for abdominal pain, diarrhea, nausea and vomiting.  Endocrine: Negative for polydipsia and polyuria.       Hot flashes  Genitourinary:  Negative for dysuria and hematuria.  Musculoskeletal:  Positive for back pain. Negative for neck pain and neck stiffness.  Skin:  Negative for rash.  Neurological:  Negative for dizziness and weakness.  Psychiatric/Behavioral:  Negative for agitation and behavioral problems.       Objective:    Physical Exam Vitals reviewed.  Constitutional:      General: She is not in acute distress.    Appearance: She is obese. She is not diaphoretic.  HENT:     Head: Normocephalic and atraumatic.     Nose: Nose normal. No congestion.     Mouth/Throat:     Mouth: Mucous membranes are moist.     Pharynx: No  posterior oropharyngeal erythema.  Eyes:     General: No scleral icterus.    Extraocular Movements: Extraocular movements intact.  Neck:     Vascular: No carotid bruit.  Cardiovascular:     Rate and Rhythm: Normal rate and regular rhythm.     Pulses: Normal pulses.     Heart sounds: Murmur (Systolic over upper sternal border) heard.  Pulmonary:     Breath sounds: Normal breath sounds. No wheezing or rales.  Abdominal:     Palpations: Abdomen is soft.     Tenderness: There is no abdominal tenderness.  Musculoskeletal:     Cervical back: Neck supple. No tenderness.     Right lower leg: No edema.     Left lower leg: No edema.  Skin:    General: Skin  is warm.     Findings: Lesion (Hyperkeratotic lesion, brownish, about 2 cm in diameter - likely seborrheic keratosis) present.     Comments: Papular lesions over thigh and right shoulder area, ruptured acneiform lesions  Neurological:     General: No focal deficit present.     Mental Status: She is alert and oriented to person, place, and time.     Sensory: No sensory deficit.     Motor: No weakness.  Psychiatric:        Mood and Affect: Mood normal.        Behavior: Behavior normal.     BP (!) 142/80 (BP Location: Left Arm, Cuff Size: Normal)   Resp 18   Ht 5\' 5"  (1.651 m)   Wt 207 lb (93.9 kg)   LMP 09/20/2015 (Exact Date)   BMI 34.45 kg/m  Wt Readings from Last 3 Encounters:  03/06/22 207 lb (93.9 kg)  03/01/22 205 lb 14.6 oz (93.4 kg)  02/14/22 218 lb (98.9 kg)    Lab Results  Component Value Date   TSH 1.530 05/03/2021   Lab Results  Component Value Date   WBC 7.3 02/22/2022   HGB 15.1 (H) 02/22/2022   HCT 46.8 (H) 02/22/2022   MCV 94.7 02/22/2022   PLT 389 02/22/2022   Lab Results  Component Value Date   NA 134 (L) 02/22/2022   K 3.6 02/22/2022   CO2 22 02/22/2022   GLUCOSE 429 (H) 02/22/2022   BUN 14 02/22/2022   CREATININE 0.89 02/22/2022   BILITOT 0.6 02/22/2022   ALKPHOS 123 02/22/2022   AST 13  (L) 02/22/2022   ALT 12 02/22/2022   PROT 7.7 02/22/2022   ALBUMIN 3.6 02/22/2022   CALCIUM 10.1 02/22/2022   ANIONGAP 8 02/22/2022   Lab Results  Component Value Date   CHOL 206 (H) 11/01/2020   Lab Results  Component Value Date   HDL 39 (L) 10/21/2021   Lab Results  Component Value Date   LDLCALC 136 (H) 11/01/2020   Lab Results  Component Value Date   TRIG 138 10/21/2021   Lab Results  Component Value Date   CHOLHDL 5.3 04/19/2020   Lab Results  Component Value Date   HGBA1C 8.2 (H) 10/21/2021      Assessment & Plan:   Problem List Items Addressed This Visit       Cardiovascular and Mediastinum   Essential hypertension, benign    BP Readings from Last 1 Encounters:  03/06/22 (!) 142/80  Elevated today as she has not had her medications today. Usually well-controlled with Amlodipine 10 mg QD, lisinopril 40 mg daily, Spironolactone 25 mg QD and Labetalol 100 mg BID Counseled for compliance with the medications Advised DASH diet and moderate exercise/walking, at least 150 mins/week      Relevant Medications   lisinopril (ZESTRIL) 40 MG tablet   amLODipine (NORVASC) 10 MG tablet   labetalol (NORMODYNE) 100 MG tablet     Endocrine   Uncontrolled type 2 diabetes mellitus with hyperglycemia, with long-term current use of insulin (HCC)    Lab Results  Component Value Date   HGBA1C 8.2 (H) 10/21/2021  On Metformin, Jardiance and Lantus 30 U QD, followed by endocrinology Advised to follow diabetic diet On statin and ACEi Diabetic eye exam: Advised to follow up with Ophthalmology for diabetic eye exam       Relevant Medications   lisinopril (ZESTRIL) 40 MG tablet   Other Relevant Orders   Hemoglobin A1c  Musculoskeletal and Integument   Intertrigo    Has perineal area itching - Ketoconazole cream Check vaginal swab      Relevant Medications   ketoconazole (NIZORAL) 2 % cream     Other   Mixed hyperlipidemia    Lipid profile reviewed Last  cardiology note reviewed, referred to lipid clinic Currently on statin, plan to start PCSK9 inhibitor therapy      Relevant Medications   lisinopril (ZESTRIL) 40 MG tablet   amLODipine (NORVASC) 10 MG tablet   labetalol (NORMODYNE) 100 MG tablet   Other Relevant Orders   Lipid panel   Encounter for general adult medical examination with abnormal findings - Primary    Physical exam as documented. Counseling done  re healthy lifestyle involving commitment to 150 minutes exercise per week, heart healthy diet, and attaining healthy weight.The importance of adequate sleep also discussed. Changes in health habits are decided on by the patient with goals and time frames  set for achieving them. Immunization and cancer screening needs are specifically addressed at this visit.      Relevant Orders   VITAMIN D 25 Hydroxy (Vit-D Deficiency, Fractures)   TSH   Other Visit Diagnoses     Need for hepatitis C screening test       Relevant Orders   Hepatitis C Antibody   Vaginal itching       Relevant Orders   Cervicovaginal ancillary only       Meds ordered this encounter  Medications   lisinopril (ZESTRIL) 40 MG tablet    Sig: Take 1 tablet (40 mg total) by mouth daily.    Dispense:  90 tablet    Refill:  1   amLODipine (NORVASC) 10 MG tablet    Sig: Take 1 tablet (10 mg total) by mouth daily.    Dispense:  90 tablet    Refill:  1   labetalol (NORMODYNE) 100 MG tablet    Sig: Take 1 tablet (100 mg total) by mouth 2 (two) times daily.    Dispense:  180 tablet    Refill:  1   ketoconazole (NIZORAL) 2 % cream    Sig: Apply 1 Application topically daily.    Dispense:  30 g    Refill:  0    Follow-up: Return in about 3 months (around 06/06/2022) for HTN and DM.    Anabel Halon, MD

## 2022-03-07 LAB — CERVICOVAGINAL ANCILLARY ONLY
Bacterial Vaginitis (gardnerella): NEGATIVE
Candida Glabrata: POSITIVE — AB
Candida Vaginitis: POSITIVE — AB
Comment: NEGATIVE
Comment: NEGATIVE
Comment: NEGATIVE

## 2022-03-07 LAB — TSH: TSH: 1.8 u[IU]/mL (ref 0.450–4.500)

## 2022-03-07 LAB — HEMOGLOBIN A1C
Est. average glucose Bld gHb Est-mCnc: 220 mg/dL
Hgb A1c MFr Bld: 9.3 % — ABNORMAL HIGH (ref 4.8–5.6)

## 2022-03-07 LAB — LIPID PANEL
Chol/HDL Ratio: 5.9 ratio — ABNORMAL HIGH (ref 0.0–4.4)
Cholesterol, Total: 196 mg/dL (ref 100–199)
HDL: 33 mg/dL — ABNORMAL LOW (ref >39)
LDL Chol Calc (NIH): 141 mg/dL — ABNORMAL HIGH (ref 0–99)
Triglycerides: 119 mg/dL (ref 0–149)
VLDL Cholesterol Cal: 22 mg/dL (ref 5–40)

## 2022-03-07 LAB — VITAMIN D 25 HYDROXY (VIT D DEFICIENCY, FRACTURES): Vit D, 25-Hydroxy: 34 ng/mL (ref 30.0–100.0)

## 2022-03-07 LAB — HEPATITIS C ANTIBODY: Hep C Virus Ab: NONREACTIVE

## 2022-03-07 MED ORDER — FLUCONAZOLE 150 MG PO TABS: 150.0000 mg | ORAL_TABLET | Freq: Once | ORAL | 0 refills | Status: AC

## 2022-03-08 NOTE — Patient Instructions (Addendum)
   Your procedure is scheduled on: 03/15/2022  Report to [facility] at   8:15  AM.  Call this number if you have problems the morning of surgery: [phone]   Remember:              Follow Directions on the letter you received from Your Physician's office regarding the Bowel Prep              No Smoking the day of Procedure :   Take these medicines the morning of surgery with A SIP OF WATER: Amlodipine, cymbalta, and labetalol  No diabetic medications am of procedure   Do not wear jewelry, make-up or nail polish.    Do not bring valuables to the hospital.  Contacts, dentures or bridgework may not be worn into surgery.  .   Patients discharged the day of surgery will not be allowed to drive home.     Colonoscopy, Adult, Care After This sheet gives you information about how to care for yourself after your procedure. Your health care provider may also give you more specific instructions. If you have problems or questions, contact your health care provider. What can I expect after the procedure? After the procedure, it is common to have: A small amount of blood in your stool for 24 hours after the procedure. Some gas. Mild abdominal cramping or bloating.  Follow these instructions at home: General instructions  For the first 24 hours after the procedure: Do not drive or use machinery. Do not sign important documents. Do not drink alcohol. Do your regular daily activities at a slower pace than normal. Eat soft, easy-to-digest foods. Rest often. Take over-the-counter or prescription medicines only as told by your health care provider. It is up to you to get the results of your procedure. Ask your health care provider, or the department performing the procedure, when your results will be ready. Relieving cramping and bloating Try walking around when you have cramps or feel bloated. Apply heat to your abdomen as told by your health care provider. Use a heat source  that your health care provider recommends, such as a moist heat pack or a heating pad. Place a towel between your skin and the heat source. Leave the heat on for 20-30 minutes. Remove the heat if your skin turns bright red. This is especially important if you are unable to feel pain, heat, or cold. You may have a greater risk of getting burned. Eating and drinking Drink enough fluid to keep your urine clear or pale yellow. Resume your normal diet as instructed by your health care provider. Avoid heavy or fried foods that are hard to digest. Avoid drinking alcohol for as long as instructed by your health care provider. Contact a health care provider if: You have blood in your stool 2-3 days after the procedure. Get help right away if: You have more than a small spotting of blood in your stool. You pass large blood clots in your stool. Your abdomen is swollen. You have nausea or vomiting. You have a fever. You have increasing abdominal pain that is not relieved with medicine. This information is not intended to replace advice given to you by your health care provider. Make sure you discuss any questions you have with your health care provider. Document Released: 04/11/2004 Document Revised: 05/22/2016 Document Reviewed: 11/09/2015 Elsevier Interactive Patient Education  [employer].

## 2022-03-13 ENCOUNTER — Encounter (HOSPITAL_COMMUNITY): Payer: Self-pay | Admitting: Anesthesiology

## 2022-03-13 ENCOUNTER — Encounter (HOSPITAL_COMMUNITY)
Admission: RE | Admit: 2022-03-13 | Discharge: 2022-03-13 | Disposition: A | Discharge status: Home / Self Care | Payer: Medicaid Other | Source: Ambulatory Visit | Attending: Internal Medicine | Admitting: Internal Medicine

## 2022-03-13 ENCOUNTER — Ambulatory Visit (INDEPENDENT_AMBULATORY_CARE_PROVIDER_SITE_OTHER): Payer: Medicaid Other | Admitting: Internal Medicine

## 2022-03-13 ENCOUNTER — Encounter (HOSPITAL_COMMUNITY): Payer: Self-pay

## 2022-03-13 ENCOUNTER — Encounter: Payer: Self-pay | Admitting: Internal Medicine

## 2022-03-13 VITALS — BP 140/80 | HR 70 | Ht 65.0 in | Wt 206.0 lb

## 2022-03-13 DIAGNOSIS — L02212 Cutaneous abscess of back [any part, except buttock]: Secondary | ICD-10-CM | POA: Diagnosis not present

## 2022-03-13 MED ORDER — CEPHALEXIN 500 MG PO CAPS
500.0000 mg | ORAL_CAPSULE | Freq: Four times a day (QID) | ORAL | 0 refills | Status: DC
Start: 2022-03-13 — End: 2022-03-30

## 2022-03-13 NOTE — Patient Instructions (Signed)
Please start taking Cephalexin as prescribed.

## 2022-03-13 NOTE — Progress Notes (Signed)
Acute Office Visit  Subjective:    Patient ID: Sonya James, female    DOB: Jul 10, 1966, 56 y.o.   MRN: [id number]  Chief Complaint  Patient presents with   Sore    Sore on back infected    HPI Patient is in today for c/o soreness of back with redness. She had a small spot of folliculitis in the last week, but has been worse today with surrounding erythema and pain. Denies any drainage recently, and has black comedone over abscess area. Denies any fever or chills. She went for colonoscopy pretesting and was told to get evaluation of back abscess.  Past Medical History:  Diagnosis Date   Anemia    Anxiety    Arthritis    Asthma 05/25/2013   Autonomic neuropathy    Cancer of endometrium (HCC) 10/18/2015   Cellulitis, face 05/03/2021   Constipation    COPD (chronic obstructive pulmonary disease) (HCC)    CTS (carpal tunnel syndrome)    Depression    Diabetes mellitus without complication (HCC)    Dysphagia 12/14/2016   Encounter for screening colonoscopy    Endometrial cancer (HCC) 2017   Gastritis without bleeding    GERD (gastroesophageal reflux disease)    Gross hematuria 05/11/2019   Headache(784.0)    Heavy menses 10/11/2015   HTN (hypertension)    Hypercholesterolemia    Hypothyroidism    IBS (irritable bowel syndrome)    Osteoarthritis    osteoarthritis   Pancreatitis    per patient    Recurrent boils    buttocks and low back.   S/P laparoscopic hysterectomy 11/04/2015   Shortness of breath    Sleep apnea    CPAP machine   Uncontrolled type 2 diabetes mellitus with diabetic polyneuropathy, with long-term current use of insulin 08/04/2017   Uterine cancer (HCC) 2017   Vitamin D deficiency     Past Surgical History:  Procedure Laterality Date   ABDOMINAL HYSTERECTOMY  2018   endometrial cancer, surgery done at [facility]    BIOPSY  01/02/2017   Procedure: BIOPSY;  Surgeon: West Bali, MD;  Location: [facility];  Service: Endoscopy;;  gastric  biopsy   CESAREAN SECTION  1989   COLONOSCOPY WITH PROPOFOL N/A 01/02/2017   Dr. Darrick Penna: redundant colon, two 2-3 mm polyps in sigmoid colon (hyperplastic)   EAR CYST EXCISION Right 11/29/2021   Procedure: EXCISION OF EXTERNAL EAR LESION;  Surgeon: Laren Boom, DO;  Location: [facility];  Service: ENT;  Laterality: Right;   ESOPHAGOGASTRODUODENOSCOPY  05/22/2011   Dr. Darrick Penna: H.pylori gastritis    ESOPHAGOGASTRODUODENOSCOPY (EGD) WITH PROPOFOL N/A 01/02/2017   Dr. Darrick Penna: possible web in proximal esophagus s/p dilation, moderate gastritis (negative H.pylori)   FOOT SURGERY Left    tendon repair   IR US GUIDE VASC ACCESS LEFT  01/18/2021   OOPHORECTOMY     OTOPLASATY Left 06/15/2021   Procedure: Excision of left auricular lesion with endaural meatoplasty;  Surgeon: Laren Boom, DO;  Location: [facility];  Service: ENT;  Laterality: Left;   POLYPECTOMY  01/02/2017   Procedure: POLYPECTOMY;  Surgeon: West Bali, MD;  Location: [facility];  Service: Endoscopy;;  sigmoid colon polyps times 2   REDUCTION MAMMAPLASTY  1996   SAVORY DILATION N/A 01/02/2017   Procedure: SAVORY DILATION;  Surgeon: West Bali, MD;  Location: [facility];  Service: Endoscopy;  Laterality: N/A;   TUBAL LIGATION      Family History  Problem Relation Age of Onset   Diabetes Mother    Hypertension Mother    COPD Mother    Asthma Mother    Hypercholesterolemia Mother    Hypertension Sister    Hyperlipidemia Sister    Diabetes Sister    Asthma Brother    Alcohol abuse Brother    Asthma Son    Cancer Maternal Grandmother        lung, throat, breast   Alzheimer's disease Maternal Grandmother    Hypertension Maternal Grandmother    Hypercholesterolemia Maternal Grandmother    Heart disease Maternal Grandfather    Stroke Maternal Grandfather    Clotting disorder Maternal Grandfather        blood clots in legs   Hypertension Sister    Hyperlipidemia Sister    Stroke Maternal Aunt    Clotting  disorder Maternal Aunt        blood clots in legs   Hypertension Maternal Aunt    Hypercholesterolemia Maternal Aunt    Hypertension Maternal Aunt    Asthma Maternal Aunt    Clotting disorder Maternal Uncle        blood clot -legs traveled to heart and he died of heart attack   Colon cancer Neg Hx     Social History   Socioeconomic History   Marital status: Married    Spouse name: Not on file   Number of children: 1   Years of education: Not on file   Highest education level: Not on file  Occupational History   Not on file  Tobacco Use   Smoking status: Some Days    Packs/day: 1.00    Years: 22.00    Total pack years: 22.00    Types: Cigarettes   Smokeless tobacco: Never  Vaping Use   Vaping Use: Never used  Substance and Sexual Activity   Alcohol use: No   Drug use: No   Sexual activity: Yes    Birth control/protection: Surgical    Comment: hysterectomy  Other Topics Concern   Not on file  Social History Narrative   Lives with husband and she has been married to for 9 years.  Recently got evicted and has been living in a motel since last July.   She has 1 son that lives in [place].  Reports one grandbaby      Enjoys watching TV mostly animal [place].      Diet: Eats all food groups   Caffeine: Soda not diet, green tea, coffee about 3 cups of caffeine per day.   Water: Drinks 5-6 8 ounce bottles throughout the day.      Wears a seatbelt.  Currently does not have a vehicle.  Smoke detectors in the facility she is living in.  Does not have any weapons.   Social Determinants of Health   Financial Resource Strain: Low Risk  (03/25/2020)   Overall Financial Resource Strain (CARDIA)    Difficulty of Paying Living Expenses: Not hard at all  Food Insecurity: No Food Insecurity (03/25/2020)   Hunger Vital Sign    Worried About Running Out of Food in the Last Year: Never true    Ran Out of Food in the Last Year: Never true  Transportation Needs: Unmet Transportation  Needs (03/25/2020)   PRAPARE - Transportation    Lack of Transportation (Medical): Yes    Lack of Transportation (Non-Medical): Yes  Physical Activity: Insufficiently Active (03/25/2020)   Exercise Vital Sign    Days of Exercise per Week: 3  days    Minutes of Exercise per Session: 20 min  Stress: No Stress Concern Present (03/25/2020)   [place]    Feeling of Stress : Not at all  Social Connections: Moderately Isolated (03/25/2020)   Social Connection and Isolation Panel [NHANES]    Frequency of Communication with Friends and Family: More than three times a week    Frequency of Social Gatherings with Friends and Family: More than three times a week    Attends Religious Services: 1 to 4 times per year    Active Member of [employer] or Organizations: No    Attends Archivist Meetings: Never    Marital Status: Separated  Intimate Partner Violence: Not At Risk (03/25/2020)   Humiliation, Afraid, Rape, and Kick questionnaire    Fear of Current or Ex-Partner: No    Emotionally Abused: No    Physically Abused: No    Sexually Abused: No    Outpatient Medications Prior to Visit  Medication Sig Dispense Refill   Accu-Chek Softclix Lancets lancets To test glucose 4 times a day 150 each 2   albuterol (VENTOLIN HFA) 108 (90 Base) MCG/ACT inhaler Inhale 2 puffs into the lungs every 6 (six) hours as needed for wheezing or shortness of breath. 18 g 5   amLODipine (NORVASC) 10 MG tablet Take 1 tablet (10 mg total) by mouth daily. 90 tablet 1   apixaban (ELIQUIS) 5 MG TABS tablet Take 1 tablet (5 mg total) by mouth 2 (two) times daily. On 08/12/17 @ 9PM, start 1 tab (5 mg) two times daily. 60 tablet 5   atorvastatin (LIPITOR) 80 MG tablet Take 1 tablet (80 mg total) by mouth daily. 90 tablet 3   Blood Glucose Monitoring Suppl (ACCU-CHEK GUIDE ME) w/Device KIT 1 Piece by Does not apply route as directed. 1 kit 0   clotrimazole  (GYNE-LOTRIMIN 3) 2 % vaginal cream Place 1 Applicatorful vaginally at bedtime. 21 g 0   cycloSPORINE (RESTASIS) 0.05 % ophthalmic emulsion Place 1 drop into both eyes 2 (two) times daily.     DULoxetine (CYMBALTA) 60 MG capsule Take 60 mg by mouth daily.     empagliflozin (JARDIANCE) 25 MG TABS tablet Take 1 tablet (25 mg total) by mouth daily before breakfast. 90 tablet 1   esomeprazole (NEXIUM) 40 MG capsule TAKE 1 CAPSULE BY MOUTH TWICE DAILY BEFORE MEAL(S) 180 capsule 3   furosemide (LASIX) 40 MG tablet Take 1 tablet by mouth once daily 90 tablet 0   glucose blood (ACCU-CHEK GUIDE) test strip Test glucose 4 times a day 150 each 2   insulin detemir (LEVEMIR FLEXPEN) 100 UNIT/ML FlexPen Inject 30 Units into the skin daily. 30 mL 3   Insulin Pen Needle 31G X 5 MM MISC 1 Device by Does not apply route daily in the afternoon. 100 each 2   ketoconazole (NIZORAL) 2 % cream Apply 1 Application topically daily. 30 g 0   labetalol (NORMODYNE) 100 MG tablet Take 1 tablet (100 mg total) by mouth 2 (two) times daily. 180 tablet 1   LINZESS 290 MCG CAPS capsule TAKE 1 CAPSULE BY MOUTH ONCE DAILY BEFORE BREAKFAST 90 capsule 0   lisinopril (ZESTRIL) 40 MG tablet Take 1 tablet (40 mg total) by mouth daily. 90 tablet 1   metFORMIN (GLUCOPHAGE) 1000 MG tablet Take 1 tablet (1,000 mg total) by mouth 2 (two) times daily with a meal. 90 tablet 1   Misc. Devices MISC Blood  pressure monitoring device 1 each 0   montelukast (SINGULAIR) 10 MG tablet TAKE 1 TABLET BY MOUTH AT BEDTIME 90 tablet 0   ondansetron (ZOFRAN-ODT) 4 MG disintegrating tablet Take 4 mg by mouth every 6 (six) hours as needed for nausea or vomiting.     potassium chloride (KLOR-CON) 10 MEQ tablet TAKE 1 TABLET BY MOUTH ONCE DAILY IN THE EVENING 90 tablet 0   rizatriptan (MAXALT) 10 MG tablet Take 10 mg by mouth daily as needed for migraine.     Sod Picosulfate-Mag Ox-Cit Acd (CLENPIQ) 10-3.5-12 MG-GM -GM/175ML SOLN Take 1 kit by mouth as directed.  350 mL 0   spironolactone (ALDACTONE) 25 MG tablet Take 1 tablet (25 mg total) by mouth daily. 90 tablet 3   topiramate (TOPAMAX) 25 MG tablet Take 50 mg by mouth at bedtime.     Vitamin D, Ergocalciferol, (DRISDOL) 1.25 MG (50000 UNIT) CAPS capsule Take 1 capsule by mouth once a week 8 capsule 0   No facility-administered medications prior to visit.    No Known Allergies  Review of Systems  Constitutional:  Negative for chills and fever.  Musculoskeletal:  Negative for neck pain and neck stiffness.  Skin:        Skin abscess over back  Neurological:  Negative for dizziness and weakness.  Psychiatric/Behavioral:  Negative for agitation and behavioral problems.        Objective:    Physical Exam Vitals reviewed.  Constitutional:      General: She is not in acute distress.    Appearance: She is obese. She is not diaphoretic.  HENT:     Head: Normocephalic and atraumatic.     Nose: Nose normal. No congestion.  Neck:     Vascular: No carotid bruit.  Cardiovascular:     Rate and Rhythm: Normal rate and regular rhythm.     Pulses: Normal pulses.     Heart sounds: Murmur (Systolic over upper sternal border) heard.  Pulmonary:     Breath sounds: Normal breath sounds. No wheezing or rales.  Musculoskeletal:     Cervical back: Neck supple. No tenderness.  Skin:    General: Skin is warm.     Findings: Abscess (Over back - thoracic spine area, with warmth - about 3 cm in diameter) and lesion (Hyperkeratotic lesion, brownish, about 2 cm in diameter - likely seborrheic keratosis) present.     Comments: Papular lesions over thigh and right shoulder area, ruptured acneiform lesions  Neurological:     General: No focal deficit present.     Mental Status: She is alert and oriented to person, place, and time.     Sensory: No sensory deficit.     Motor: No weakness.  Psychiatric:        Mood and Affect: Mood normal.        Behavior: Behavior normal.     BP 140/80 (BP Location:  Right Arm, Cuff Size: Normal)   Pulse 70   Ht $R'5\' 5"'ze$  (1.651 m)   Wt 206 lb (93.4 kg)   LMP 09/20/2015 (Exact Date)   SpO2 96%   BMI 34.28 kg/m  Wt Readings from Last 3 Encounters:  03/13/22 206 lb (93.4 kg)  03/13/22 205 lb (93 kg)  03/06/22 207 lb (93.9 kg)        Assessment & Plan:   Problem List Items Addressed This Visit    Visit Diagnoses     Cutaneous abscess of back excluding buttocks    -  Primary Started Keflex Keep area clean and dry Referred to General surgery for possible need for I&D if it does not improve with abx   Relevant Medications   cephALEXin (KEFLEX) 500 MG capsule   Other Relevant Orders   Ambulatory referral to General Surgery        Meds ordered this encounter  Medications   cephALEXin (KEFLEX) 500 MG capsule    Sig: Take 1 capsule (500 mg total) by mouth 4 (four) times daily.    Dispense:  15 capsule    Refill:  0     Sarinah Doetsch Keith Rake, MD

## 2022-03-15 ENCOUNTER — Ambulatory Visit (HOSPITAL_COMMUNITY): Admission: RE | Admit: 2022-03-15 | Payer: Medicaid Other | Source: Ambulatory Visit

## 2022-03-15 ENCOUNTER — Encounter (HOSPITAL_COMMUNITY): Admission: RE | Payer: Self-pay | Source: Ambulatory Visit

## 2022-03-15 DIAGNOSIS — K635 Polyp of colon: Secondary | ICD-10-CM

## 2022-03-15 SURGERY — COLONOSCOPY WITH PROPOFOL
Anesthesia: Monitor Anesthesia Care

## 2022-03-15 NOTE — OR Nursing (Signed)
Per patient Dr. Posey Pronto told patient to get her infection cleared up prior to her colonoscopy.  Colonoscopy will be rescheduled.

## 2022-03-21 ENCOUNTER — Encounter: Payer: Self-pay | Admitting: Orthopaedic Surgery

## 2022-03-21 ENCOUNTER — Ambulatory Visit (INDEPENDENT_AMBULATORY_CARE_PROVIDER_SITE_OTHER): Payer: Medicaid Other | Admitting: Orthopaedic Surgery

## 2022-03-21 DIAGNOSIS — G8929 Other chronic pain: Secondary | ICD-10-CM | POA: Diagnosis not present

## 2022-03-21 DIAGNOSIS — M25561 Pain in right knee: Secondary | ICD-10-CM

## 2022-03-21 DIAGNOSIS — M25562 Pain in left knee: Secondary | ICD-10-CM

## 2022-03-21 NOTE — Progress Notes (Signed)
PROCEDURE NOTE:  The patient requests injections of the left knee , verbal consent was obtained.  The left knee was prepped appropriately after time out was performed.   Sterile technique was observed and injection of 1 cc of DepoMedrol 40 mg with several cc's of plain xylocaine. Anesthesia was provided by ethyl chloride and a 20-gauge needle was used to inject the knee area. The injection was tolerated well.  A band aid dressing was applied.  The patient was advised to apply ice later today and tomorrow to the injection sight as needed.  PROCEDURE NOTE:  The patient requests injections of the right knee , verbal consent was obtained.  The right knee was prepped appropriately after time out was performed.   Sterile technique was observed and injection of 1 cc of DepoMedrol '40mg'$  with several cc's of plain xylocaine. Anesthesia was provided by ethyl chloride and a 20-gauge needle was used to inject the knee area. The injection was tolerated well.  A band aid dressing was applied.  The patient was advised to apply ice later today and tomorrow to the injection sight as needed.  Encounter Diagnosis  Name Primary?   Chronic pain of both knees Yes   Return in six weeks.  Call if any problem.  Precautions discussed.  Electronically Signed Sanjuana Kava, MD 7/11/202310:33 AM

## 2022-03-24 ENCOUNTER — Ambulatory Visit (INDEPENDENT_AMBULATORY_CARE_PROVIDER_SITE_OTHER): Payer: Medicaid Other | Admitting: Internal Medicine

## 2022-03-24 ENCOUNTER — Encounter: Payer: Self-pay | Admitting: Internal Medicine

## 2022-03-24 VITALS — BP 120/72 | HR 74 | Ht 65.0 in | Wt 217.0 lb

## 2022-03-24 DIAGNOSIS — E1169 Type 2 diabetes mellitus with other specified complication: Secondary | ICD-10-CM

## 2022-03-24 DIAGNOSIS — E1165 Type 2 diabetes mellitus with hyperglycemia: Secondary | ICD-10-CM | POA: Diagnosis not present

## 2022-03-24 DIAGNOSIS — Z794 Long term (current) use of insulin: Secondary | ICD-10-CM | POA: Diagnosis not present

## 2022-03-24 DIAGNOSIS — E1142 Type 2 diabetes mellitus with diabetic polyneuropathy: Secondary | ICD-10-CM

## 2022-03-24 LAB — POCT GLUCOSE (DEVICE FOR HOME USE): POC Glucose: 129 mg/dl — AB (ref 70–99)

## 2022-03-24 MED ORDER — DEXCOM G7 SENSOR MISC: 1.0000 | 3 refills | Status: DC

## 2022-03-24 MED ORDER — METFORMIN HCL 1000 MG PO TABS: 1000.0000 mg | ORAL_TABLET | Freq: Two times a day (BID) | ORAL | 3 refills | Status: DC

## 2022-03-24 MED ORDER — EMPAGLIFLOZIN 25 MG PO TABS: 25.0000 mg | ORAL_TABLET | Freq: Every day | ORAL | 3 refills | Status: DC

## 2022-03-24 MED ORDER — LANTUS SOLOSTAR 100 UNIT/ML ~~LOC~~ SOPN: 34.0000 [IU] | PEN_INJECTOR | Freq: Every day | SUBCUTANEOUS | 6 refills | Status: DC

## 2022-03-24 NOTE — Patient Instructions (Addendum)
Levemir 17 units twice a day ( Or lantus 34 units once a day)  Continue Metformin 1000 mg Twice d ay  Continue Jardiance 25 mg daily      HOW TO TREAT LOW BLOOD SUGARS (Blood sugar LESS THAN 70 MG/DL) Please follow the RULE OF 15 for the treatment of hypoglycemia treatment (when your (blood sugars are less than 70 mg/dL)   STEP 1: Take 15 grams of carbohydrates when your blood sugar is low, which includes:  3-4 GLUCOSE TABS  OR 3-4 OZ OF JUICE OR REGULAR SODA OR ONE TUBE OF GLUCOSE GEL    STEP 2: RECHECK blood sugar in 15 MINUTES STEP 3: If your blood sugar is still low at the 15 minute recheck --> then, go back to STEP 1 and treat AGAIN with another 15 grams of carbohydrates.

## 2022-03-24 NOTE — Progress Notes (Signed)
Name: Sonya James  MRN/ DOB: [id number], 1965-11-23   Age/ Sex: 56 y.o., female    PCP: Anabel Halon, MD   Reason for Endocrinology Evaluation: Type 2 Diabetes Mellitus     Date of Initial Endocrinology Visit: 09/02/2022    PATIENT IDENTIFIER: Sonya James is a 57 y.o. female with a past medical history of T2DM, HTN, Hx of endometrial cancer and Hx of Pancreatitis and dyslipidemia . The patient presented for initial endocrinology clinic visit on 09/02/2021  for consultative assistance with her diabetes management.    HPI: [name] was    Diagnosed with DM 2014 Prior Medications tried/Intolerance: Has been on jardiance without intolerance issues           Hemoglobin A1c has ranged from 5.9% in 2017, peaking at 12.2% in 2022.  She has dysphagia and thyromegaly . She had an ultrasound in 2005 that did not show any nodules at the time . TFT's normal    On her initial visit to our clinic she had an A1c 9.0 % , She was on Glipizide, Novolin Mix , and Metformin . We stopped Glipizide, Novolin Mix, started basal insulin, Jardiance and continued Metformin      SUBJECTIVE:   During the last visit (11/21/2021): A1c 8.2 % We stopped Glipizide, Novolin Mix, started basal insulin, Jardiance and continued Metformin      Today (03/24/22): [name] is here for a follow up on diabetes management.   She checks her  blood sugars 0 times daily. The patient has not had hypoglycemic episodes since the last clinic visit.  She is upset that she was prepped for colonoscopy but developed a back abscess which postponed her colonoscopy   Has noted frequency since being on spironolactone  Denies nausea or vomiting   HOME DIABETES REGIMEN: Metformin  1000 mg BID  Jardiance 25 mg daily  Levemir 30 units daily     Statin: yes ACE-I/ARB: yes Prior Diabetic Education: yes - does not like going there    METER DOWNLOAD SUMMARY: did not bring    DIABETIC COMPLICATIONS: Microvascular  complications:  Neuropathy Denies: CKD, retinopathy  Last eye exam: Completed 2023  Macrovascular complications:   Denies: CAD, CVA      PAST HISTORY: Past Medical History:  Past Medical History:  Diagnosis Date   Anemia    Anxiety    Arthritis    Asthma 05/25/2013   Autonomic neuropathy    Cancer of endometrium (HCC) 10/18/2015   Cellulitis, face 05/03/2021   Constipation    COPD (chronic obstructive pulmonary disease) (HCC)    CTS (carpal tunnel syndrome)    Depression    Diabetes mellitus without complication (HCC)    Dysphagia 12/14/2016   Encounter for screening colonoscopy    Endometrial cancer (HCC) 2017   Gastritis without bleeding    GERD (gastroesophageal reflux disease)    Gross hematuria 05/11/2019   Headache(784.0)    Heavy menses 10/11/2015   HTN (hypertension)    Hypercholesterolemia    Hypothyroidism    IBS (irritable bowel syndrome)    Osteoarthritis    osteoarthritis   Pancreatitis    per patient    Recurrent boils    buttocks and low back.   S/P laparoscopic hysterectomy 11/04/2015   Shortness of breath    Sleep apnea    CPAP machine   Uncontrolled type 2 diabetes mellitus with diabetic polyneuropathy, with long-term current use of insulin 08/04/2017   Uterine cancer (HCC) 2017  Vitamin D deficiency    Past Surgical History:  Past Surgical History:  Procedure Laterality Date   ABDOMINAL HYSTERECTOMY  2018   endometrial cancer, surgery done at [facility]  01/02/2017   Procedure: BIOPSY;  Surgeon: Danie Binder, MD;  [facility];  Service: Endoscopy;;  gastric biopsy   CESAREAN SECTION  1989   COLONOSCOPY WITH PROPOFOL N/A 01/02/2017   Dr. Oneida Alar: redundant colon, two 2-3 mm polyps in sigmoid colon (hyperplastic)   EAR CYST EXCISION Right 11/29/2021   Procedure: EXCISION OF EXTERNAL EAR LESION;  Surgeon: Jason Coop, DO;  Location: [place];  Service: ENT;  Laterality: Right;   ESOPHAGOGASTRODUODENOSCOPY   05/22/2011   Dr. Oneida Alar: H.pylori gastritis    ESOPHAGOGASTRODUODENOSCOPY (EGD) WITH PROPOFOL N/A 01/02/2017   Dr. 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 s/p dilation, moderate gastritis (negative H.pylori)   FOOT SURGERY Left    tendon repair   IR US GUIDE VASC ACCESS LEFT  01/18/2021   OOPHORECTOMY     OTOPLASATY Left 06/15/2021   Procedure: Excision of left auricular lesion with endaural meatoplasty;  Surgeon: Jason Coop, DO;  Location: [place];  Service: ENT;  Laterality: Left;   POLYPECTOMY  01/02/2017   Procedure: POLYPECTOMY;  Surgeon: Danie Binder, MD;  [facility];  Service: Endoscopy;;  sigmoid colon polyps times 2   REDUCTION MAMMAPLASTY  1996   SAVORY DILATION N/A 01/02/2017   Procedure: SAVORY DILATION;  Surgeon: Danie Binder, MD;  [facility];  Service: Endoscopy;  Laterality: N/A;   TUBAL LIGATION      Social History:  reports that she has been smoking cigarettes. She has a 22.00 pack-year smoking history. She has never used smokeless tobacco. She reports that she does not drink alcohol and does not use drugs. Family History:  Family History  Problem Relation Age of Onset   Diabetes Mother    Hypertension Mother    COPD Mother    Asthma Mother    Hypercholesterolemia Mother    Hypertension Sister    Hyperlipidemia Sister    Diabetes Sister    Asthma Brother    Alcohol abuse Brother    Asthma Son    Cancer Maternal Grandmother        lung, throat, breast   Alzheimer's disease Maternal Grandmother    Hypertension Maternal Grandmother    Hypercholesterolemia Maternal Grandmother    Heart disease Maternal Grandfather    Stroke Maternal Grandfather    Clotting disorder Maternal Grandfather        blood clots in legs   Hypertension Sister    Hyperlipidemia Sister    Stroke Maternal Aunt    Clotting disorder Maternal Aunt        blood clots in legs   Hypertension Maternal Aunt    Hypercholesterolemia Maternal Aunt     Hypertension Maternal Aunt    Asthma Maternal Aunt    Clotting disorder Maternal Uncle        blood clot -legs traveled to heart and he died of heart attack   Colon cancer Neg Hx      HOME MEDICATIONS: Allergies as of 03/24/2022   No Known Allergies      Medication List        Accurate as of March 24, 2022  2:07 PM. If you have any questions, ask your nurse or doctor.          Accu-Chek Guide Me w/Device  Kit 1 Piece by Does not apply route as directed.   Accu-Chek Guide test strip Generic drug: glucose blood Test glucose 4 times a day   Accu-Chek Softclix Lancets lancets To test glucose 4 times a day   albuterol 108 (90 Base) MCG/ACT inhaler Commonly known as: VENTOLIN HFA Inhale 2 puffs into the lungs every 6 (six) hours as needed for wheezing or shortness of breath.   amLODipine 10 MG tablet Commonly known as: NORVASC Take 1 tablet (10 mg total) by mouth daily.   apixaban 5 MG Tabs tablet Commonly known as: ELIQUIS Take 1 tablet (5 mg total) by mouth 2 (two) times daily. On 08/12/17 @ 9PM, start 1 tab (5 mg) two times daily.   atorvastatin 80 MG tablet Commonly known as: LIPITOR Take 1 tablet (80 mg total) by mouth daily.   cephALEXin 500 MG capsule Commonly known as: KEFLEX Take 1 capsule (500 mg total) by mouth 4 (four) times daily.   Clenpiq 10-3.5-12 MG-GM -GM/175ML Soln Generic drug: Sod Picosulfate-Mag Ox-Cit Acd Take 1 kit by mouth as directed.   clotrimazole 2 % vaginal cream Commonly known as: GYNE-LOTRIMIN 3 Place 1 Applicatorful vaginally at bedtime.   cycloSPORINE 0.05 % ophthalmic emulsion Commonly known as: RESTASIS Place 1 drop into both eyes 2 (two) times daily.   DULoxetine 60 MG capsule Commonly known as: CYMBALTA Take 60 mg by mouth daily.   empagliflozin 25 MG Tabs tablet Commonly known as: Jardiance Take 1 tablet (25 mg total) by mouth daily before breakfast.   esomeprazole 40 MG capsule Commonly known as: NEXIUM TAKE 1  CAPSULE BY MOUTH TWICE DAILY BEFORE MEAL(S)   furosemide 40 MG tablet Commonly known as: LASIX Take 1 tablet by mouth once daily   Insulin Pen Needle 31G X 5 MM Misc 1 Device by Does not apply route daily in the afternoon.   ketoconazole 2 % cream Commonly known as: NIZORAL Apply 1 Application topically daily.   labetalol 100 MG tablet Commonly known as: NORMODYNE Take 1 tablet (100 mg total) by mouth 2 (two) times daily.   Levemir FlexPen 100 UNIT/ML FlexPen Generic drug: insulin detemir Inject 30 Units into the skin daily.   Linzess 290 MCG Caps capsule Generic drug: linaclotide TAKE 1 CAPSULE BY MOUTH ONCE DAILY BEFORE BREAKFAST   lisinopril 40 MG tablet Commonly known as: ZESTRIL Take 1 tablet (40 mg total) by mouth daily.   metFORMIN 1000 MG tablet Commonly known as: GLUCOPHAGE Take 1 tablet (1,000 mg total) by mouth 2 (two) times daily with a meal.   Misc. Devices Misc Blood pressure monitoring device   montelukast 10 MG tablet Commonly known as: SINGULAIR TAKE 1 TABLET BY MOUTH AT BEDTIME   ondansetron 4 MG disintegrating tablet Commonly known as: ZOFRAN-ODT Take 4 mg by mouth every 6 (six) hours as needed for nausea or vomiting.   potassium chloride 10 MEQ tablet Commonly known as: KLOR-CON TAKE 1 TABLET BY MOUTH ONCE DAILY IN THE EVENING   rizatriptan 10 MG tablet Commonly known as: MAXALT Take 10 mg by mouth daily as needed for migraine.   spironolactone 25 MG tablet Commonly known as: ALDACTONE Take 1 tablet (25 mg total) by mouth daily.   topiramate 25 MG tablet Commonly known as: TOPAMAX Take 50 mg by mouth at bedtime.   Vitamin D (Ergocalciferol) 1.25 MG (50000 UNIT) Caps capsule Commonly known as: DRISDOL Take 1 capsule by mouth once a week         ALLERGIES: No  Known Allergies      OBJECTIVE:   VITAL SIGNS: BP 120/72 (BP Location: Left Arm, Patient Position: Sitting, Cuff Size: Large)   Pulse 74   Ht $R'5\' 5"'rY$  (1.651 m)   Wt  217 lb (98.4 kg)   LMP 09/20/2015 (Exact Date)   SpO2 95%   BMI 36.11 kg/m    PHYSICAL EXAM:  General: Pt appears well and is in NAD  Neck: General: Supple without adenopathy or carotid bruits. Thyroid: Thyroid size normal.  No goiter or nodules appreciated. No thyroid bruit.  Lungs: Clear with good BS bilat with no rales, rhonchi, or wheezes  Heart: RRR   Extremities:  Lower extremities - No pretibial edema. No lesions.  Neuro: MS is good with appropriate affect, pt is alert and Ox3   DM Foot Exam 03/24/2022 The skin of the feet is intact without sores or ulcerations. The pedal pulses are 1+ on right and 1+ on left. The sensation is decreased to a screening 5.07, 10 gram monofilament bilaterally   DATA REVIEWED:  Lab Results  Component Value Date   HGBA1C 9.3 (H) 03/06/2022   HGBA1C 8.2 (H) 10/21/2021   HGBA1C 9.0 (A) 09/02/2021     03/06/2022   A1c 9.3% BUN 14 CR 0.890 GFR 64 LDL 141 Tg 119      Latest Reference Range & Units 10/21/21 10:52 10/21/21 10:53  Sodium 135 - 145 mmol/L 134 (L)   Potassium 3.5 - 5.1 mmol/L 4.2   Chloride 98 - 111 mmol/L 102   CO2 22 - 32 mmol/L 26   Glucose 70 - 99 mg/dL 191 (H)   Mean Plasma Glucose mg/dL  188.64  BUN 6 - 20 mg/dL 18   Creatinine 0.44 - 1.00 mg/dL 0.78   Calcium 8.9 - 10.3 mg/dL 10.2   Anion gap 5 - 15  6     Thyroid Ultrasound 09/19/2021  Estimated total number of nodules >/= 1 cm: 0   Number of spongiform nodules >/=  2 cm not described below (TR1): 0   Number of mixed cystic and solid nodules >/= 1.5 cm not described below (TR2): 0   _________________________________________________________   Few scattered less than 5 mm nodules do not meet criteria for imaging surveillance.   IMPRESSION: No significant sonographic abnormality of the thyroid.     ASSESSMENT / PLAN / RECOMMENDATIONS:   1) Type 2 Diabetes Mellitus, Poorly controlled, With neuropathic  complications - Most recent A1c of 9.3 %.  Goal A1c < 7.0 %.     -Patient continues with hyperglycemia, she admits to dietary indiscretions she also has had an abscess and undergoing treatment for that -She is NOT a candidate for GLP-1 agonist and DPP-4 inhibitors due to hx of pancreatitis  -I am going to increase her basal insulin, we will divide Levemir to twice daily dosing, I am going to try to switch her to Lantus that way she can take it once a day -We will continue metformin and Jardiance -If she continues with persistent hyperglycemia we will consider prandial insulin    MEDICATIONS: Continue Metformin 1000 mg Twice d ay  Continue Jardiance 25 mg daily  Change Levemir 17 units twice daily  EDUCATION / INSTRUCTIONS: BG monitoring instructions: Patient is instructed to check her blood sugars 1 times a day, fasting . Call [facility] if: BG persistently < 70  I reviewed the Rule of 15 for the treatment of hypoglycemia in detail with the patient. Literature supplied.   2)  Diabetic complications:  Eye: Does not have known diabetic retinopathy.  Neuro/ Feet: Does have known diabetic peripheral neuropathy. Renal: Patient does not have known baseline CKD. She is not on an ACEI/ARB at present.        Signed electronically by: Mack Guise, MD  [facility]  [facility] [address] [place], [place] [address] Phone: [phone] FAX: [phone]   CC: [address], [place] [address] Phone: [phone]  Fax: [phone]    Return to Endocrinology clinic as below: Future Appointments  Date Time Provider [place]  03/24/2022  2:40 PM Daimien Patmon, Melanie Crazier, MD [facility] None  03/30/2022 11:30 AM Pappayliou, Flint Melter, DO [facility] None  04/06/2022 11:30 AM Estill Dooms, NP [facility]  05/09/2022 10:30 AM Sanjuana Kava, MD [facility] None  06/06/2022  1:40 PM Lindell Spar, MD [facility] RPC   02/21/2023  9:00 AM [facility] None  02/21/2023 10:00 AM Harriett Rush, PA-C [facility] None

## 2022-03-30 ENCOUNTER — Encounter: Payer: Self-pay | Admitting: Surgery

## 2022-03-30 ENCOUNTER — Ambulatory Visit (INDEPENDENT_AMBULATORY_CARE_PROVIDER_SITE_OTHER): Payer: Medicaid Other | Admitting: Surgery

## 2022-03-30 VITALS — BP 142/76 | HR 60 | Temp 98.5°F | Resp 14 | Ht 65.0 in | Wt 210.0 lb

## 2022-03-30 DIAGNOSIS — L02212 Cutaneous abscess of back [any part, except buttock]: Secondary | ICD-10-CM

## 2022-03-30 NOTE — Progress Notes (Signed)
[facility] History and Physical  Reason for Referral: Back abscess Referring Physician: Dr. Posey Pronto  Chief Complaint   New Patient (Initial Visit)     Sonya James is a 56 y.o. female.  HPI: Patient presents for evaluation of back abscess.  She first noted a small bump on her back about 5 weeks ago.  At that time it was sore and red.  She saw Dr. Posey Pronto, who recommended to just monitor it at that time.  A couple weeks later, it had increasing swelling, redness and tenderness, concerning for infection.  She saw Dr. Posey Pronto who placed her on Keflex and referred her to general surgery for possible I&D.  She had a colonoscopy canceled at this time secondary to this active infection.  4 days after starting her antibiotics, she noted bloody and purulent drainage from the area.  She last noted drainage from it yesterday.  She completed her course of antibiotics.  She has also gotten irritated around this area from tape.  She denies fever, chills, nausea or vomiting.  Her blood sugars have not been well controlled, as her most recent hemoglobin A1c was 9.  She is currently taking insulin for her diabetes.  She confirms history of other boils on her body before, but she has never had to have anything excised previously.  She is moving her bowels without issue.  Her surgical history significant for breast reduction and hysterectomy.  She takes BC powders twice daily and Eliquis for a PE.  She currently smokes half pack per day.  She denies use of alcohol and illicit drugs.  Past Medical History:  Diagnosis Date   Anemia    Anxiety    Arthritis    Asthma 05/25/2013   Autonomic neuropathy    Cancer of endometrium ([place]) 10/18/2015   Cellulitis, face 05/03/2021   Constipation    COPD (chronic obstructive pulmonary disease) (HCC)    CTS (carpal tunnel syndrome)    Depression    Diabetes mellitus without complication ([place])    Dysphagia 12/14/2016   Encounter for screening colonoscopy     Endometrial cancer ([place]) 2017   Gastritis without bleeding    GERD (gastroesophageal reflux disease)    Gross hematuria 05/11/2019   Headache(784.0)    Heavy menses 10/11/2015   HTN (hypertension)    Hypercholesterolemia    Hypothyroidism    IBS (irritable bowel syndrome)    Osteoarthritis    osteoarthritis   Pancreatitis    per patient    Recurrent boils    buttocks and low back.   S/P laparoscopic hysterectomy 11/04/2015   Shortness of breath    Sleep apnea    CPAP machine   Uncontrolled type 2 diabetes mellitus with diabetic polyneuropathy, with long-term current use of insulin 08/04/2017   Uterine cancer ([place]) 2017   Vitamin D deficiency     Past Surgical History:  Procedure Laterality Date   ABDOMINAL HYSTERECTOMY  2018   endometrial cancer, surgery done at [facility]  01/02/2017   Procedure: BIOPSY;  Surgeon: Danie Binder, MD;  [facility];  Service: Endoscopy;;  gastric biopsy   CESAREAN SECTION  1989   COLONOSCOPY WITH PROPOFOL N/A 01/02/2017   Dr. Oneida Alar: redundant colon, two 2-3 mm polyps in sigmoid colon (hyperplastic)   EAR CYST EXCISION Right 11/29/2021   Procedure: EXCISION OF EXTERNAL EAR LESION;  Surgeon: Jason Coop, DO;  Location: [facility];  Service: ENT;  Laterality: Right;  ESOPHAGOGASTRODUODENOSCOPY  05/22/2011   Dr. Oneida Alar: H.pylori gastritis    ESOPHAGOGASTRODUODENOSCOPY (EGD) WITH PROPOFOL N/A 01/02/2017   Dr. 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 s/p dilation, moderate gastritis (negative H.pylori)   FOOT SURGERY Left    tendon repair   IR US GUIDE VASC ACCESS LEFT  01/18/2021   OOPHORECTOMY     OTOPLASATY Left 06/15/2021   Procedure: Excision of left auricular lesion with endaural meatoplasty;  Surgeon: Jason Coop, DO;  Location: [facility];  Service: ENT;  Laterality: Left;   POLYPECTOMY  01/02/2017   Procedure: POLYPECTOMY;  Surgeon: Danie Binder, MD;  [facility];  Service: Endoscopy;;  sigmoid  colon polyps times 2   REDUCTION MAMMAPLASTY  1996   SAVORY DILATION N/A 01/02/2017   Procedure: SAVORY DILATION;  Surgeon: Danie Binder, MD;  [facility];  Service: Endoscopy;  Laterality: N/A;   TUBAL LIGATION      Family History  Problem Relation Age of Onset   Diabetes Mother    Hypertension Mother    COPD Mother    Asthma Mother    Hypercholesterolemia Mother    Hypertension Sister    Hyperlipidemia Sister    Diabetes Sister    Asthma Brother    Alcohol abuse Brother    Asthma Son    Cancer Maternal Grandmother        lung, throat, breast   Alzheimer's disease Maternal Grandmother    Hypertension Maternal Grandmother    Hypercholesterolemia Maternal Grandmother    Heart disease Maternal Grandfather    Stroke Maternal Grandfather    Clotting disorder Maternal Grandfather        blood clots in legs   Hypertension Sister    Hyperlipidemia Sister    Stroke Maternal Aunt    Clotting disorder Maternal Aunt        blood clots in legs   Hypertension Maternal Aunt    Hypercholesterolemia Maternal Aunt    Hypertension Maternal Aunt    Asthma Maternal Aunt    Clotting disorder Maternal Uncle        blood clot -legs traveled to heart and he died of heart attack   Colon cancer Neg Hx     Social History   Tobacco Use   Smoking status: Some Days    Packs/day: 1.00    Years: 22.00    Total pack years: 22.00    Types: Cigarettes   Smokeless tobacco: Never  Vaping Use   Vaping Use: Never used  Substance Use Topics   Alcohol use: No   Drug use: No    Medications: I have reviewed the patient's current medications. Allergies as of 03/30/2022   No Known Allergies      Medication List        Accurate as of March 30, 2022 11:06 AM. If you have any questions, ask your nurse or doctor.          STOP taking these medications    cephALEXin 500 MG capsule Commonly known as: KEFLEX Stopped by: Jaylin Benzel A Emalynn Clewis, DO       TAKE these  medications    Accu-Chek Guide Me w/Device Kit 1 Piece by Does not apply route as directed.   Accu-Chek Guide test strip Generic drug: glucose blood Test glucose 4 times a day   Accu-Chek Softclix Lancets lancets To test glucose 4 times a day   albuterol 108 (90 Base) MCG/ACT inhaler Commonly known as: VENTOLIN HFA Inhale 2 puffs into  the lungs every 6 (six) hours as needed for wheezing or shortness of breath.   amLODipine 10 MG tablet Commonly known as: NORVASC Take 1 tablet (10 mg total) by mouth daily.   apixaban 5 MG Tabs tablet Commonly known as: ELIQUIS Take 1 tablet (5 mg total) by mouth 2 (two) times daily. On 08/12/17 @ 9PM, start 1 tab (5 mg) two times daily.   atorvastatin 80 MG tablet Commonly known as: LIPITOR Take 1 tablet (80 mg total) by mouth daily.   Clenpiq 10-3.5-12 MG-GM -GM/175ML Soln Generic drug: Sod Picosulfate-Mag Ox-Cit Acd Take 1 kit by mouth as directed.   clotrimazole 2 % vaginal cream Commonly known as: GYNE-LOTRIMIN 3 Place 1 Applicatorful vaginally at bedtime.   cycloSPORINE 0.05 % ophthalmic emulsion Commonly known as: RESTASIS Place 1 drop into both eyes 2 (two) times daily.   Dexcom G7 Sensor Misc 1 Device by Does not apply route as directed.   DULoxetine 60 MG capsule Commonly known as: CYMBALTA Take 60 mg by mouth daily.   empagliflozin 25 MG Tabs tablet Commonly known as: Jardiance Take 1 tablet (25 mg total) by mouth daily before breakfast.   esomeprazole 40 MG capsule Commonly known as: NEXIUM TAKE 1 CAPSULE BY MOUTH TWICE DAILY BEFORE MEAL(S)   furosemide 40 MG tablet Commonly known as: LASIX Take 1 tablet by mouth once daily   Insulin Pen Needle 31G X 5 MM Misc 1 Device by Does not apply route daily in the afternoon.   ketoconazole 2 % cream Commonly known as: NIZORAL Apply 1 Application topically daily.   labetalol 100 MG tablet Commonly known as: NORMODYNE Take 1 tablet (100 mg total) by mouth 2 (two)  times daily.   Lantus SoloStar 100 UNIT/ML Solostar Pen Generic drug: insulin glargine Inject 34 Units into the skin daily.   Linzess 290 MCG Caps capsule Generic drug: linaclotide TAKE 1 CAPSULE BY MOUTH ONCE DAILY BEFORE BREAKFAST   lisinopril 40 MG tablet Commonly known as: ZESTRIL Take 1 tablet (40 mg total) by mouth daily.   metFORMIN 1000 MG tablet Commonly known as: GLUCOPHAGE Take 1 tablet (1,000 mg total) by mouth 2 (two) times daily with a meal.   Misc. Devices Misc Blood pressure monitoring device   montelukast 10 MG tablet Commonly known as: SINGULAIR TAKE 1 TABLET BY MOUTH AT BEDTIME   ondansetron 4 MG disintegrating tablet Commonly known as: ZOFRAN-ODT Take 4 mg by mouth every 6 (six) hours as needed for nausea or vomiting.   potassium chloride 10 MEQ tablet Commonly known as: KLOR-CON TAKE 1 TABLET BY MOUTH ONCE DAILY IN THE EVENING   rizatriptan 10 MG tablet Commonly known as: MAXALT Take 10 mg by mouth daily as needed for migraine.   spironolactone 25 MG tablet Commonly known as: ALDACTONE Take 1 tablet (25 mg total) by mouth daily.   topiramate 25 MG tablet Commonly known as: TOPAMAX Take 50 mg by mouth at bedtime.   Vitamin D (Ergocalciferol) 1.25 MG (50000 UNIT) Caps capsule Commonly known as: DRISDOL Take 1 capsule by mouth once a week         ROS:  Constitutional: negative for chills, fatigue, and fevers Eyes: negative for visual disturbance and pain Ears, nose, mouth, throat, and face: negative for ear drainage, sore throat, and sinus problems Respiratory: positive for cough, wheezing, and shortness of breath Cardiovascular: positive for chest pain Gastrointestinal: positive for reflux symptoms, negative for abdominal pain, nausea, and vomiting Genitourinary:positive for frequency, negative for dysuria and  urinary retention Integument/breast: positive for dryness and boils Hematologic/lymphatic: positive for bleeding, negative for  lymphadenopathy Musculoskeletal:positive for back pain, neck pain, and joint pain Neurological: positive for dizziness and numbness, negative for tremors Endocrine: negative for temperature intolerance  Blood pressure (!) 142/76, pulse 60, temperature 98.5 F (36.9 C), temperature source Oral, resp. rate 14, height $RemoveBe'5\' 5"'vkIGRoaVI$  (1.651 m), weight 210 lb (95.3 kg), last menstrual period 09/20/2015, SpO2 96 %. Physical Exam Vitals reviewed.  Constitutional:      Appearance: Normal appearance. She is obese.  HENT:     Head: Normocephalic and atraumatic.  Eyes:     Extraocular Movements: Extraocular movements intact.     Pupils: Pupils are equal, round, and reactive to light.  Cardiovascular:     Rate and Rhythm: Normal rate and regular rhythm.     Heart sounds: Murmur heard.  Pulmonary:     Effort: Pulmonary effort is normal.     Breath sounds: Normal breath sounds.  Abdominal:     General: There is no distension.     Palpations: Abdomen is soft.     Tenderness: There is no abdominal tenderness.  Musculoskeletal:        General: Normal range of motion.     Cervical back: Normal range of motion.  Skin:    Comments: Area of skin discoloration near site of previous abscess, small opening without drainage, minimal induration, nontender, no erythema or fluctuance  Neurological:     General: No focal deficit present.     Mental Status: She is alert and oriented to person, place, and time.  Psychiatric:        Mood and Affect: Mood normal.        Behavior: Behavior normal.     Results: No results found for this or any previous visit (from the past 48 hour(s)).  No results found.   Assessment & Plan:  REBEKA KIMBLE is a 56 y.o. female who presents for evaluation of back abscess.  -Explained to the patient that this area does not appear to be acutely infected, as there is no fluid collection, erythema, or tenderness -I am not sure what the underlying cause of this abscess was, but I did  explain that it is not uncommon to have cysts on the back that can subsequently get infected. -No plan for I&D or excision at this time -Information provided to the patient on epidermoid cysts -Follow up as needed for recurrence of abscess or evidence of cyst on back -Okay for patient to reschedule colonoscopy at this time  All questions were answered to the satisfaction of the patient.   Graciella Freer, DO [facility] [address] [place], [place] [address] [phone] (office)

## 2022-03-31 ENCOUNTER — Other Ambulatory Visit (HOSPITAL_COMMUNITY): Payer: Self-pay

## 2022-04-02 ENCOUNTER — Other Ambulatory Visit: Payer: Self-pay

## 2022-04-02 ENCOUNTER — Encounter (HOSPITAL_COMMUNITY): Payer: Self-pay

## 2022-04-02 ENCOUNTER — Emergency Department (HOSPITAL_COMMUNITY)
Admission: EM | Admit: 2022-04-02 | Discharge: 2022-04-02 | Disposition: A | Discharge status: Home / Self Care | Payer: Medicaid Other | Attending: Emergency Medicine | Admitting: Emergency Medicine

## 2022-04-02 DIAGNOSIS — H02843 Edema of right eye, unspecified eyelid: Secondary | ICD-10-CM | POA: Insufficient documentation

## 2022-04-02 DIAGNOSIS — I10 Essential (primary) hypertension: Secondary | ICD-10-CM | POA: Insufficient documentation

## 2022-04-02 DIAGNOSIS — H02846 Edema of left eye, unspecified eyelid: Secondary | ICD-10-CM | POA: Insufficient documentation

## 2022-04-02 DIAGNOSIS — Z7901 Long term (current) use of anticoagulants: Secondary | ICD-10-CM | POA: Diagnosis not present

## 2022-04-02 DIAGNOSIS — Z7984 Long term (current) use of oral hypoglycemic drugs: Secondary | ICD-10-CM | POA: Diagnosis not present

## 2022-04-02 DIAGNOSIS — Z794 Long term (current) use of insulin: Secondary | ICD-10-CM | POA: Diagnosis not present

## 2022-04-02 DIAGNOSIS — Z79899 Other long term (current) drug therapy: Secondary | ICD-10-CM | POA: Diagnosis not present

## 2022-04-02 DIAGNOSIS — E119 Type 2 diabetes mellitus without complications: Secondary | ICD-10-CM | POA: Diagnosis not present

## 2022-04-02 DIAGNOSIS — R600 Localized edema: Secondary | ICD-10-CM

## 2022-04-02 DIAGNOSIS — T783XXA Angioneurotic edema, initial encounter: Secondary | ICD-10-CM | POA: Diagnosis not present

## 2022-04-02 DIAGNOSIS — T464X5A Adverse effect of angiotensin-converting-enzyme inhibitors, initial encounter: Secondary | ICD-10-CM

## 2022-04-02 LAB — CBC WITH DIFFERENTIAL/PLATELET
Abs Immature Granulocytes: 0.02 10*3/uL (ref 0.00–0.07)
Basophils Absolute: 0.1 10*3/uL (ref 0.0–0.1)
Basophils Relative: 1 %
Eosinophils Absolute: 0.4 10*3/uL (ref 0.0–0.5)
Eosinophils Relative: 6 %
HCT: 42.5 % (ref 36.0–46.0)
Hemoglobin: 13.4 g/dL (ref 12.0–15.0)
Immature Granulocytes: 0 %
Lymphocytes Relative: 34 %
Lymphs Abs: 2.2 10*3/uL (ref 0.7–4.0)
MCH: 30.1 pg (ref 26.0–34.0)
MCHC: 31.5 g/dL (ref 30.0–36.0)
MCV: 95.5 fL (ref 80.0–100.0)
Monocytes Absolute: 0.4 10*3/uL (ref 0.1–1.0)
Monocytes Relative: 7 %
Neutro Abs: 3.5 10*3/uL (ref 1.7–7.7)
Neutrophils Relative %: 52 %
Platelets: 351 10*3/uL (ref 150–400)
RBC: 4.45 MIL/uL (ref 3.87–5.11)
RDW: 13.4 % (ref 11.5–15.5)
WBC: 6.5 10*3/uL (ref 4.0–10.5)
nRBC: 0 % (ref 0.0–0.2)

## 2022-04-02 LAB — COMPREHENSIVE METABOLIC PANEL
ALT: 11 U/L (ref 0–44)
AST: 11 U/L — ABNORMAL LOW (ref 15–41)
Albumin: 3.6 g/dL (ref 3.5–5.0)
Alkaline Phosphatase: 106 U/L (ref 38–126)
Anion gap: 3 — ABNORMAL LOW (ref 5–15)
BUN: 11 mg/dL (ref 6–20)
CO2: 28 mmol/L (ref 22–32)
Calcium: 10.2 mg/dL (ref 8.9–10.3)
Chloride: 108 mmol/L (ref 98–111)
Creatinine, Ser: 0.64 mg/dL (ref 0.44–1.00)
GFR, Estimated: >60 mL/min (ref >60)
Glucose, Bld: 118 mg/dL — ABNORMAL HIGH (ref 70–99)
Potassium: 4.2 mmol/L (ref 3.5–5.1)
Sodium: 139 mmol/L (ref 135–145)
Total Bilirubin: 0.5 mg/dL (ref 0.3–1.2)
Total Protein: 7.3 g/dL (ref 6.5–8.1)

## 2022-04-02 NOTE — ED Triage Notes (Signed)
Pt states she sprayed for bugs last night. Pt states that she did not have a fan on while doing so. Pt is now having bilateral eye swelling, worse on left eye.

## 2022-04-02 NOTE — Discharge Instructions (Signed)
There were no significant abnormalities on your blood work today except for mildly elevated blood sugar at 114. Please stop taking your lisinopril immediately. Call your primary care doctor tomorrow to report that you had had an episode of angioedema which is likely from your lisinopril and you need to start taking a new blood pressure medicine. You may try taking some over-the-counter Benadryl which may help improve your symptoms however frequently this type of swelling will resolve on its own without intervention as you discontinue taking that medication.  Get help right away if: You have severe swelling of your mouth, tongue, or lips. Your swelling gets worse. You have trouble breathing, swallowing, or talking. You have chest pain, dizziness or light-headedness, or you pass out.

## 2022-04-02 NOTE — ED Provider Notes (Signed)
[facility] EMERGENCY DEPARTMENT Provider Note   CSN: [id number] Arrival date & time: 04/02/22  1246     History  Chief Complaint  Patient presents with   Facial Swelling    Sonya James is a 56 y.o. female.  56 year old female who presents emergency department for evaluation of isolated periorbital edema.  Patient was spraying for bedbugs yesterday but denies getting any in her eyes or inhaling fumes.  She denies any eye pain, watering, lash mattering, changes in vision.  She states she woke up with edema in her left eyelid and then her right eyelid also got extremely swollen.  She has no pain, itching, tearing.  She has no edema elsewhere.  Patient has a past medical history of diabetes, hypertension, hyperlipidemia.  She denies eye allergies.  She does take an ACE inhibitor.        Home Medications Prior to Admission medications   Medication Sig Start Date End Date Taking? Authorizing Provider  Accu-Chek Softclix Lancets lancets To test glucose 4 times a day 11/17/20   Cassandria Anger, MD  albuterol (VENTOLIN HFA) 108 (90 Base) MCG/ACT inhaler Inhale 2 puffs into the lungs every 6 (six) hours as needed for wheezing or shortness of breath. 08/21/19   Corum, Rex Kras, MD  amLODipine (NORVASC) 10 MG tablet Take 1 tablet (10 mg total) by mouth daily. 03/06/22   Lindell Spar, MD  apixaban (ELIQUIS) 5 MG TABS tablet Take 1 tablet (5 mg total) by mouth 2 (two) times daily. On 08/12/17 @ 9PM, start 1 tab (5 mg) two times daily. 10/28/20   Perlie Mayo, NP  atorvastatin (LIPITOR) 80 MG tablet Take 1 tablet (80 mg total) by mouth daily. 09/02/21   [name], Melanie Crazier, MD  Blood Glucose Monitoring Suppl (ACCU-CHEK GUIDE ME) w/Device KIT 1 Piece by Does not apply route as directed. 11/16/20   Cassandria Anger, MD  clotrimazole (GYNE-LOTRIMIN 3) 2 % vaginal cream Place 1 Applicatorful vaginally at bedtime. 05/03/21   Fayrene Helper, MD  Continuous Blood Gluc Sensor  (DEXCOM G7 SENSOR) MISC 1 Device by Does not apply route as directed. 03/24/22   [name], Melanie Crazier, MD  cycloSPORINE (RESTASIS) 0.05 % ophthalmic emulsion Place 1 drop into both eyes 2 (two) times daily.    [provider]  DULoxetine (CYMBALTA) 60 MG capsule Take 60 mg by mouth daily. 01/17/21   [provider]  empagliflozin (JARDIANCE) 25 MG TABS tablet Take 1 tablet (25 mg total) by mouth daily before breakfast. 03/24/22   [name], Melanie Crazier, MD  esomeprazole (NEXIUM) 40 MG capsule TAKE 1 CAPSULE BY MOUTH TWICE DAILY BEFORE MEAL(S) 02/14/22   Sherron Monday, NP  furosemide (LASIX) 40 MG tablet Take 1 tablet by mouth once daily 02/09/22   Lindell Spar, MD  glucose blood (ACCU-CHEK GUIDE) test strip Test glucose 4 times a day 11/17/20   Cassandria Anger, MD  insulin glargine (LANTUS SOLOSTAR) 100 UNIT/ML Solostar Pen Inject 34 Units into the skin daily. 03/24/22   [name], Melanie Crazier, MD  Insulin Pen Needle 31G X 5 MM MISC 1 Device by Does not apply route daily in the afternoon. 09/02/21   [name], Melanie Crazier, MD  ketoconazole (NIZORAL) 2 % cream Apply 1 Application topically daily. 03/06/22   Lindell Spar, MD  labetalol (NORMODYNE) 100 MG tablet Take 1 tablet (100 mg total) by mouth 2 (two) times daily. 03/06/22   Lindell Spar, MD  LINZESS 290 MCG  CAPS capsule TAKE 1 CAPSULE BY MOUTH ONCE DAILY BEFORE BREAKFAST 02/09/22   Annitta Needs, NP  lisinopril (ZESTRIL) 40 MG tablet Take 1 tablet (40 mg total) by mouth daily. 03/06/22   Lindell Spar, MD  metFORMIN (GLUCOPHAGE) 1000 MG tablet Take 1 tablet (1,000 mg total) by mouth 2 (two) times daily with a meal. 03/24/22   [name], Melanie Crazier, MD  Misc. Devices MISC Blood pressure monitoring device 07/28/21   Lindell Spar, MD  montelukast (SINGULAIR) 10 MG tablet TAKE 1 TABLET BY MOUTH AT BEDTIME 02/09/22   Lindell Spar, MD  ondansetron (ZOFRAN-ODT) 4 MG disintegrating tablet Take 4 mg by  mouth every 6 (six) hours as needed for nausea or vomiting. 10/11/15   [provider]  potassium chloride (KLOR-CON) 10 MEQ tablet TAKE 1 TABLET BY MOUTH ONCE DAILY IN THE EVENING 02/09/22   Lindell Spar, MD  rizatriptan (MAXALT) 10 MG tablet Take 10 mg by mouth daily as needed for migraine. 01/05/21   [provider]  Sod Picosulfate-Mag Ox-Cit Acd (CLENPIQ) 10-3.5-12 MG-GM -GM/175ML SOLN Take 1 kit by mouth as directed. 02/20/22   Eloise Harman, DO  spironolactone (ALDACTONE) 25 MG tablet Take 1 tablet (25 mg total) by mouth daily. 10/14/21 03/02/22  Fay Records, MD  topiramate (TOPAMAX) 25 MG tablet Take 50 mg by mouth at bedtime. 01/17/21   [provider]  Vitamin D, Ergocalciferol, (DRISDOL) 1.25 MG (50000 UNIT) CAPS capsule Take 1 capsule by mouth once a week 02/09/22   Lindell Spar, MD      Allergies    Patient has no known allergies.    Review of Systems   Review of Systems  Physical Exam Updated Vital Signs BP (!) 155/78 (BP Location: Right Arm)   Pulse (!) 55   Temp 98 F (36.7 C) (Oral)   Resp 16   Ht $R'5\' 5"'vg$  (1.651 m)   Wt 95.3 kg   LMP 09/20/2015 (Exact Date)   SpO2 99%   BMI 34.95 kg/m  Physical Exam Vitals and nursing note reviewed.  Constitutional:      General: She is not in acute distress.    Appearance: She is well-developed. She is not diaphoretic.  HENT:     Head: Normocephalic and atraumatic.     Right Ear: External ear normal.     Left Ear: External ear normal.     Nose: Nose normal.     Mouth/Throat:     Mouth: Mucous membranes are moist.  Eyes:     General: No scleral icterus.    Extraocular Movements: Extraocular movements intact.     Conjunctiva/sclera: Conjunctivae normal.     Pupils: Pupils are equal, round, and reactive to light.     Comments: No conjunctival erythema, chemosis, mattering or discharge.  Vision is grossly intact, PERRL, EOMI.  There is isolated edema of the upper and lower eyelids.  Cardiovascular:      Rate and Rhythm: Normal rate and regular rhythm.     Heart sounds: Normal heart sounds. No murmur heard.    No friction rub. No gallop.  Pulmonary:     Effort: Pulmonary effort is normal. No respiratory distress.     Breath sounds: Normal breath sounds.  Abdominal:     General: Bowel sounds are normal. There is no distension.     Palpations: Abdomen is soft. There is no mass.     Tenderness: There is no abdominal tenderness. There is no guarding.  Musculoskeletal:     Cervical back: Normal range of motion.  Skin:    General: Skin is warm and dry.  Neurological:     Mental Status: She is alert and oriented to person, place, and time.  Psychiatric:        Behavior: Behavior normal.      ED Results / Procedures / Treatments   Labs (all labs ordered are listed, but only abnormal results are displayed) Labs Reviewed  COMPREHENSIVE METABOLIC PANEL  CBC WITH DIFFERENTIAL/PLATELET    EKG None  Radiology No results found.  Procedures Procedures    Medications Ordered in ED Medications - No data to display  ED Course/ Medical Decision Making/ A&P                           Medical Decision Making Patient with isolated periorbital edema.  Differential diagnosis includes allergic/chemical conjunctivitis, blepharitis, preseptal or orbital cellulitis, nephrotic syndrome, hypoalbuminemia, dacrocystitis, and angioedema. On physical examination patient does not appear to have any conjunctivitis.  She has no eye pain or discharge and there is no tenderness or erythema of the skin suggestive of infection infectious or inflammatory process.  Patient has no other swelling in the lips tongue or throat, no hives suggestive of urgent reaction.  Given these facts my highest suspicion is that the patient has isolated angioedema from her lisinopril.  Currently awaiting basic labs including CBC and CMP to check for renal insufficiency, nephrotic issues or hypoalbuminemia.  Amount and/or  Complexity of Data Reviewed Labs: ordered.           Final Clinical Impression(s) / ED Diagnoses Final diagnoses:  None    Rx / DC Orders ED Discharge Orders     None         Margarita Mail, PA-C 04/02/22 2239    Sherwood Gambler, MD 04/06/22 2121

## 2022-04-03 ENCOUNTER — Encounter: Payer: Self-pay | Admitting: Internal Medicine

## 2022-04-03 ENCOUNTER — Ambulatory Visit (INDEPENDENT_AMBULATORY_CARE_PROVIDER_SITE_OTHER): Payer: Medicaid Other | Admitting: Internal Medicine

## 2022-04-03 VITALS — BP 132/84 | HR 66 | Resp 16 | Ht 65.0 in | Wt 211.2 lb

## 2022-04-03 DIAGNOSIS — T783XXD Angioneurotic edema, subsequent encounter: Secondary | ICD-10-CM

## 2022-04-03 DIAGNOSIS — H05229 Edema of unspecified orbit: Secondary | ICD-10-CM | POA: Diagnosis not present

## 2022-04-03 DIAGNOSIS — I10 Essential (primary) hypertension: Secondary | ICD-10-CM | POA: Diagnosis not present

## 2022-04-03 DIAGNOSIS — Z09 Encounter for follow-up examination after completed treatment for conditions other than malignant neoplasm: Secondary | ICD-10-CM | POA: Diagnosis not present

## 2022-04-03 MED ORDER — METHYLPREDNISOLONE ACETATE 80 MG/ML IJ SUSP
80.0000 mg | Freq: Once | INTRAMUSCULAR | Status: AC
  Administered 2022-04-03: 80 mg via INTRAMUSCULAR

## 2022-04-03 MED ORDER — SPIRONOLACTONE 50 MG PO TABS: 50.0000 mg | ORAL_TABLET | Freq: Every day | ORAL | 3 refills | Status: DC

## 2022-04-03 NOTE — Progress Notes (Unsigned)
Acute Office Visit  Subjective:    Patient ID: Sonya James, female    DOB: Nov 05, 1965, 56 y.o.   MRN: [id number]  Chief Complaint  Patient presents with   Follow-up    Patient was in er 04-02-22 face was swollen er said probably lisinopril face started swelling 04-01-22 hasnt changed or done anything different to her knowledge. Has been taking lisinopril for a long time     HPI Patient is in today for c/o b/l eye swelling X 2 days, which has been progressive. She denies any injury.  Denies any redness or eye pain.  She went to ER yesterday and was told of as isolated angioedema of the eyes.  She was told to stop lisinopril, which she takes for many years. She denies any fever, chills or nasal congestion currently.  BP is well-controlled. Takes medications regularly. Patient denies headache, dizziness, chest pain, dyspnea or palpitations.   Past Medical History:  Diagnosis Date   Anemia    Anxiety    Arthritis    Asthma 05/25/2013   Autonomic neuropathy    Cancer of endometrium ([place]) 10/18/2015   Cellulitis, face 05/03/2021   Constipation    COPD (chronic obstructive pulmonary disease) (HCC)    CTS (carpal tunnel syndrome)    Depression    Diabetes mellitus without complication ([place])    Dysphagia 12/14/2016   Encounter for screening colonoscopy    Endometrial cancer ([place]) 2017   Gastritis without bleeding    GERD (gastroesophageal reflux disease)    Gross hematuria 05/11/2019   Headache(784.0)    Heavy menses 10/11/2015   HTN (hypertension)    Hypercholesterolemia    Hypothyroidism    IBS (irritable bowel syndrome)    Osteoarthritis    osteoarthritis   Pancreatitis    per patient    Recurrent boils    buttocks and low back.   S/P laparoscopic hysterectomy 11/04/2015   Shortness of breath    Sleep apnea    CPAP machine   Uncontrolled type 2 diabetes mellitus with diabetic polyneuropathy, with long-term current use of insulin 08/04/2017   Uterine cancer ([place]) 2017    Vitamin D deficiency     Past Surgical History:  Procedure Laterality Date   ABDOMINAL HYSTERECTOMY  2018   endometrial cancer, surgery done at [facility]  01/02/2017   Procedure: BIOPSY;  Surgeon: Danie Binder, MD;  [facility];  Service: Endoscopy;;  gastric biopsy   CESAREAN SECTION  1989   COLONOSCOPY WITH PROPOFOL N/A 01/02/2017   Dr. Oneida Alar: redundant colon, two 2-3 mm polyps in sigmoid colon (hyperplastic)   EAR CYST EXCISION Right 11/29/2021   Procedure: EXCISION OF EXTERNAL EAR LESION;  Surgeon: Jason Coop, DO;  Location: [facility];  Service: ENT;  Laterality: Right;   ESOPHAGOGASTRODUODENOSCOPY  05/22/2011   Dr. Oneida Alar: H.pylori gastritis    ESOPHAGOGASTRODUODENOSCOPY (EGD) WITH PROPOFOL N/A 01/02/2017   Dr. 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 s/p dilation, moderate gastritis (negative H.pylori)   FOOT SURGERY Left    tendon repair   IR US GUIDE VASC ACCESS LEFT  01/18/2021   OOPHORECTOMY     OTOPLASATY Left 06/15/2021   Procedure: Excision of left auricular lesion with endaural meatoplasty;  Surgeon: Jason Coop, DO;  Location: [facility];  Service: ENT;  Laterality: Left;   POLYPECTOMY  01/02/2017   Procedure: POLYPECTOMY;  Surgeon: Danie Binder, MD;  [facility];  Service: Endoscopy;;  sigmoid  colon polyps times 2   REDUCTION MAMMAPLASTY  1996   SAVORY DILATION N/A 01/02/2017   Procedure: SAVORY DILATION;  Surgeon: Danie Binder, MD;  [facility];  Service: Endoscopy;  Laterality: N/A;   TUBAL LIGATION      Family History  Problem Relation Age of Onset   Diabetes Mother    Hypertension Mother    COPD Mother    Asthma Mother    Hypercholesterolemia Mother    Hypertension Sister    Hyperlipidemia Sister    Diabetes Sister    Asthma Brother    Alcohol abuse Brother    Asthma Son    Cancer Maternal Grandmother        lung, throat, breast   Alzheimer's disease Maternal Grandmother    Hypertension  Maternal Grandmother    Hypercholesterolemia Maternal Grandmother    Heart disease Maternal Grandfather    Stroke Maternal Grandfather    Clotting disorder Maternal Grandfather        blood clots in legs   Hypertension Sister    Hyperlipidemia Sister    Stroke Maternal Aunt    Clotting disorder Maternal Aunt        blood clots in legs   Hypertension Maternal Aunt    Hypercholesterolemia Maternal Aunt    Hypertension Maternal Aunt    Asthma Maternal Aunt    Clotting disorder Maternal Uncle        blood clot -legs traveled to heart and he died of heart attack   Colon cancer Neg Hx     Social History   Socioeconomic History   Marital status: Married    Spouse name: Not on file   Number of children: 1   Years of education: Not on file   Highest education level: Not on file  Occupational History   Not on file  Tobacco Use   Smoking status: Some Days    Packs/day: 1.00    Years: 22.00    Total pack years: 22.00    Types: Cigarettes   Smokeless tobacco: Never  Vaping Use   Vaping Use: Never used  Substance and Sexual Activity   Alcohol use: No   Drug use: No   Sexual activity: Yes    Birth control/protection: Surgical    Comment: hysterectomy  Other Topics Concern   Not on file  Social History Narrative   Lives with husband and she has been married to for 9 years.  Recently got evicted and has been living in a motel since last July.   She has 1 son that lives in [place].  Reports one grandbaby      Enjoys watching TV mostly animal [place].      Diet: Eats all food groups   Caffeine: Soda not diet, green tea, coffee about 3 cups of caffeine per day.   Water: Drinks 5-6 8 ounce bottles throughout the day.      Wears a seatbelt.  Currently does not have a vehicle.  Smoke detectors in the facility she is living in.  Does not have any weapons.   Social Determinants of Health   Financial Resource Strain: Low Risk  (03/25/2020)   Overall Financial Resource Strain  (CARDIA)    Difficulty of Paying Living Expenses: Not hard at all  Food Insecurity: No Food Insecurity (03/25/2020)   Hunger Vital Sign    Worried About Running Out of Food in the Last Year: Never true    Ran Out of Food in the Last Year:  Never true  Transportation Needs: Unmet Transportation Needs (03/25/2020)   PRAPARE - Transportation    Lack of Transportation (Medical): Yes    Lack of Transportation (Non-Medical): Yes  Physical Activity: Insufficiently Active (03/25/2020)   Exercise Vital Sign    Days of Exercise per Week: 3 days    Minutes of Exercise per Session: 20 min  Stress: No Stress Concern Present (03/25/2020)   [place]    Feeling of Stress : Not at all  Social Connections: Moderately Isolated (03/25/2020)   Social Connection and Isolation Panel [NHANES]    Frequency of Communication with Friends and Family: More than three times a week    Frequency of Social Gatherings with Friends and Family: More than three times a week    Attends Religious Services: 1 to 4 times per year    Active Member of [employer] or Organizations: No    Attends Archivist Meetings: Never    Marital Status: Separated  Intimate Partner Violence: Not At Risk (03/25/2020)   Humiliation, Afraid, Rape, and Kick questionnaire    Fear of Current or Ex-Partner: No    Emotionally Abused: No    Physically Abused: No    Sexually Abused: No    Outpatient Medications Prior to Visit  Medication Sig Dispense Refill   Accu-Chek Softclix Lancets lancets To test glucose 4 times a day 150 each 2   albuterol (VENTOLIN HFA) 108 (90 Base) MCG/ACT inhaler Inhale 2 puffs into the lungs every 6 (six) hours as needed for wheezing or shortness of breath. 18 g 5   amLODipine (NORVASC) 10 MG tablet Take 1 tablet (10 mg total) by mouth daily. 90 tablet 1   apixaban (ELIQUIS) 5 MG TABS tablet Take 1 tablet (5 mg total) by mouth 2 (two) times daily. On  08/12/17 @ 9PM, start 1 tab (5 mg) two times daily. 60 tablet 5   atorvastatin (LIPITOR) 80 MG tablet Take 1 tablet (80 mg total) by mouth daily. 90 tablet 3   Blood Glucose Monitoring Suppl (ACCU-CHEK GUIDE ME) w/Device KIT 1 Piece by Does not apply route as directed. 1 kit 0   clotrimazole (GYNE-LOTRIMIN 3) 2 % vaginal cream Place 1 Applicatorful vaginally at bedtime. 21 g 0   Continuous Blood Gluc Sensor (DEXCOM G7 SENSOR) MISC 1 Device by Does not apply route as directed. 9 each 3   cycloSPORINE (RESTASIS) 0.05 % ophthalmic emulsion Place 1 drop into both eyes 2 (two) times daily.     DULoxetine (CYMBALTA) 60 MG capsule Take 60 mg by mouth daily.     empagliflozin (JARDIANCE) 25 MG TABS tablet Take 1 tablet (25 mg total) by mouth daily before breakfast. 90 tablet 3   esomeprazole (NEXIUM) 40 MG capsule TAKE 1 CAPSULE BY MOUTH TWICE DAILY BEFORE MEAL(S) 180 capsule 3   furosemide (LASIX) 40 MG tablet Take 1 tablet by mouth once daily 90 tablet 0   glucose blood (ACCU-CHEK GUIDE) test strip Test glucose 4 times a day 150 each 2   insulin glargine (LANTUS SOLOSTAR) 100 UNIT/ML Solostar Pen Inject 34 Units into the skin daily. 30 mL 6   Insulin Pen Needle 31G X 5 MM MISC 1 Device by Does not apply route daily in the afternoon. 100 each 2   ketoconazole (NIZORAL) 2 % cream Apply 1 Application topically daily. 30 g 0   labetalol (NORMODYNE) 100 MG tablet Take 1 tablet (100 mg total) by mouth 2 (two) times  daily. 180 tablet 1   LINZESS 290 MCG CAPS capsule TAKE 1 CAPSULE BY MOUTH ONCE DAILY BEFORE BREAKFAST 90 capsule 0   metFORMIN (GLUCOPHAGE) 1000 MG tablet Take 1 tablet (1,000 mg total) by mouth 2 (two) times daily with a meal. 90 tablet 3   Misc. Devices MISC Blood pressure monitoring device 1 each 0   montelukast (SINGULAIR) 10 MG tablet TAKE 1 TABLET BY MOUTH AT BEDTIME 90 tablet 0   ondansetron (ZOFRAN-ODT) 4 MG disintegrating tablet Take 4 mg by mouth every 6 (six) hours as needed for nausea  or vomiting.     potassium chloride (KLOR-CON) 10 MEQ tablet TAKE 1 TABLET BY MOUTH ONCE DAILY IN THE EVENING 90 tablet 0   rizatriptan (MAXALT) 10 MG tablet Take 10 mg by mouth daily as needed for migraine.     Sod Picosulfate-Mag Ox-Cit Acd (CLENPIQ) 10-3.5-12 MG-GM -GM/175ML SOLN Take 1 kit by mouth as directed. 350 mL 0   topiramate (TOPAMAX) 25 MG tablet Take 50 mg by mouth at bedtime.     Vitamin D, Ergocalciferol, (DRISDOL) 1.25 MG (50000 UNIT) CAPS capsule Take 1 capsule by mouth once a week 8 capsule 0   lisinopril (ZESTRIL) 40 MG tablet Take 1 tablet (40 mg total) by mouth daily. (Patient not taking: Reported on 04/03/2022) 90 tablet 1   spironolactone (ALDACTONE) 25 MG tablet Take 1 tablet (25 mg total) by mouth daily. 90 tablet 3   No facility-administered medications prior to visit.    Allergies  Allergen Reactions   Lisinopril Swelling    Review of Systems  Constitutional:  Negative for chills and fever.  HENT:  Negative for congestion, sinus pressure, sinus pain and sore throat.   Eyes:  Negative for pain and discharge.       Swelling of eyelids b/l  Respiratory:  Negative for cough and shortness of breath.   Cardiovascular:  Negative for chest pain and palpitations.  Gastrointestinal:  Negative for abdominal pain, diarrhea, nausea and vomiting.  Endocrine: Negative for polydipsia and polyuria.       Hot flashes  Genitourinary:  Negative for dysuria and hematuria.  Musculoskeletal:  Positive for back pain. Negative for neck pain and neck stiffness.  Skin:  Negative for rash.  Neurological:  Negative for dizziness and weakness.  Psychiatric/Behavioral:  Negative for agitation and behavioral problems.        Objective:    Physical Exam Vitals reviewed.  Constitutional:      General: She is not in acute distress.    Appearance: She is obese. She is not diaphoretic.  HENT:     Head: Normocephalic and atraumatic.     Nose: Nose normal. No congestion.      Mouth/Throat:     Mouth: Mucous membranes are moist.     Pharynx: No posterior oropharyngeal erythema.  Eyes:     General: No scleral icterus.    Extraocular Movements: Extraocular movements intact.     Comments: Periorbital swelling b/l  Neck:     Vascular: No carotid bruit.  Cardiovascular:     Rate and Rhythm: Normal rate and regular rhythm.     Pulses: Normal pulses.     Heart sounds: Murmur (Systolic over upper sternal border) heard.  Pulmonary:     Breath sounds: Normal breath sounds. No wheezing or rales.  Abdominal:     Palpations: Abdomen is soft.     Tenderness: There is no abdominal tenderness.  Musculoskeletal:     Cervical back: Neck  supple. No tenderness.     Right lower leg: No edema.     Left lower leg: No edema.  Skin:    General: Skin is warm.     Findings: Lesion (Hyperkeratotic lesion, brownish, about 2 cm in diameter - likely seborrheic keratosis) present.     Comments: Papular lesions over thigh and right shoulder area, ruptured acneiform lesions  Neurological:     General: No focal deficit present.     Mental Status: She is alert and oriented to person, place, and time.     Sensory: No sensory deficit.     Motor: No weakness.  Psychiatric:        Mood and Affect: Mood normal.        Behavior: Behavior normal.     BP 132/84 (BP Location: Right Arm, Patient Position: Sitting, Cuff Size: Normal)   Pulse 66   Resp 16   Ht _0  (1.651 m)   Wt 211 lb 3.2 oz (95.8 kg)   LMP 09/20/2015 (Exact Date)   SpO2 97%   BMI 35.15 kg/m  Wt Readings from Last 3 Encounters:  04/03/22 211 lb 3.2 oz (95.8 kg)  04/02/22 210 lb (95.3 kg)  03/30/22 210 lb (95.3 kg)        Assessment & Plan:   Problem List Items Addressed This Visit       Cardiovascular and Mediastinum   Essential hypertension, benign    BP Readings from Last 1 Encounters:  04/03/22 132/84  Usually well-controlled with Amlodipine 10 mg QD, lisinopril 40 mg daily, Spironolactone 25 mg QD  and Labetalol 100 mg BID DC lisinopril due to angioedema Increased dose of Spironolactone to 50 mg for now Counseled for compliance with the medications Advised DASH diet and moderate exercise/walking, at least 150 mins/week      Relevant Medications   spironolactone (ALDACTONE) 50 MG tablet     Other   Angio-edema - Primary    Has b/l periorbital swelling, progressive Could be isolated angioedema due to ACE inhibitor Stop lisinopril Depo-Medrol IM today as she has persistent swelling Benadryl as needed       Encounter for examination following treatment at hospital    ER chart reviewed Picture in ER chart reviewed Has progressive swelling now      Other Visit Diagnoses     Orbital swelling       Relevant Medications   methylPREDNISolone acetate (DEPO-MEDROL) injection 80 mg (Completed)        Meds ordered this encounter  Medications   spironolactone (ALDACTONE) 50 MG tablet    Sig: Take 1 tablet (50 mg total) by mouth daily.    Dispense:  30 tablet    Refill:  3   methylPREDNISolone acetate (DEPO-MEDROL) injection 80 mg     Lindell Spar, MD

## 2022-04-03 NOTE — Patient Instructions (Signed)
Please stop taking Lisinopril.  Please take Spironolactone 50 mg instead of 25 mg.  Please use Benadryl for eye swelling.

## 2022-04-04 ENCOUNTER — Telehealth: Payer: Self-pay

## 2022-04-04 ENCOUNTER — Other Ambulatory Visit (HOSPITAL_COMMUNITY): Payer: Self-pay

## 2022-04-04 NOTE — Telephone Encounter (Signed)
Patient Advocate Encounter  Prior Authorization for [employer] has been approved.    PA Case ID: [id number]  Rx #: [id number] Effective dates: 04/04/2022 through  04/05/2023.  Clista Bernhardt, CPhT Pharmacy Patient Advocate Specialist [place] Patient Advocate Team Phone: [phone]   Fax: [phone]

## 2022-04-04 NOTE — Telephone Encounter (Signed)
Patient Advocate Encounter   Received notification from [facility] that prior authorization is required for Dexcom G7  Key [id number] Submitted: 04/04/2022 Status is pending  Clista Bernhardt, [place] Patient Advocate Specialist [place] Patient Advocate Team Phone: [phone]   Fax: [phone]

## 2022-04-05 DIAGNOSIS — T783XXA Angioneurotic edema, initial encounter: Secondary | ICD-10-CM | POA: Insufficient documentation

## 2022-04-05 DIAGNOSIS — Z09 Encounter for follow-up examination after completed treatment for conditions other than malignant neoplasm: Secondary | ICD-10-CM | POA: Insufficient documentation

## 2022-04-05 NOTE — Assessment & Plan Note (Signed)
BP Readings from Last 1 Encounters:  04/03/22 132/84   Usually well-controlled with Amlodipine 10 mg QD, lisinopril 40 mg daily, Spironolactone 25 mg QD and Labetalol 100 mg BID DC lisinopril due to angioedema Increased dose of Spironolactone to 50 mg for now Counseled for compliance with the medications Advised DASH diet and moderate exercise/walking, at least 150 mins/week

## 2022-04-05 NOTE — Assessment & Plan Note (Signed)
Has b/l periorbital swelling, progressive Could be isolated angioedema due to ACE inhibitor Stop lisinopril Depo-Medrol IM today as she has persistent swelling Benadryl as needed

## 2022-04-05 NOTE — Assessment & Plan Note (Signed)
ER chart reviewed Picture in ER chart reviewed Has progressive swelling now

## 2022-04-06 ENCOUNTER — Ambulatory Visit (INDEPENDENT_AMBULATORY_CARE_PROVIDER_SITE_OTHER): Payer: Medicaid Other | Admitting: Adult Health

## 2022-04-06 ENCOUNTER — Other Ambulatory Visit (HOSPITAL_COMMUNITY)
Admission: RE | Admit: 2022-04-06 | Discharge: 2022-04-06 | Disposition: A | Discharge status: Home / Self Care | Payer: Medicaid Other | Source: Ambulatory Visit | Attending: Adult Health | Admitting: Adult Health

## 2022-04-06 ENCOUNTER — Encounter: Payer: Self-pay | Admitting: Adult Health

## 2022-04-06 VITALS — BP 133/82 | HR 67 | Ht 64.5 in | Wt 206.0 lb

## 2022-04-06 DIAGNOSIS — Z90710 Acquired absence of both cervix and uterus: Secondary | ICD-10-CM

## 2022-04-06 DIAGNOSIS — Z90721 Acquired absence of ovaries, unilateral: Secondary | ICD-10-CM | POA: Insufficient documentation

## 2022-04-06 DIAGNOSIS — Z8542 Personal history of malignant neoplasm of other parts of uterus: Secondary | ICD-10-CM | POA: Diagnosis not present

## 2022-04-06 DIAGNOSIS — Z1211 Encounter for screening for malignant neoplasm of colon: Secondary | ICD-10-CM | POA: Diagnosis not present

## 2022-04-06 DIAGNOSIS — Z08 Encounter for follow-up examination after completed treatment for malignant neoplasm: Secondary | ICD-10-CM | POA: Diagnosis not present

## 2022-04-06 DIAGNOSIS — Z01419 Encounter for gynecological examination (general) (routine) without abnormal findings: Secondary | ICD-10-CM | POA: Diagnosis not present

## 2022-04-06 LAB — HEMOCCULT GUIAC POC 1CARD (OFFICE): Fecal Occult Blood, POC: NEGATIVE

## 2022-04-06 NOTE — Progress Notes (Signed)
Patient ID: Sonya James, female   DOB: October 25, 1965, 56 y.o.   MRN: [id number] History of Present Illness: Sonya James is a 56 year old black female, married, sp hysterectomy with BSO for endometrial cancer in 2018. She has not had regular exams,in for GYN exam today. She says she has lost 200 lbs on her own. She had physical with PCP 03/06/22.  PCP is Dr Sonya James   Current Medications, Allergies, Past Medical History, Past Surgical History, Family History and Social History were reviewed in [facility] [place] record.     Review of Systems: Patient denies any headaches, hearing loss, fatigue, blurred vision, shortness of breath, chest pain, abdominal pain, problems with bowel movements, urination, or intercourse. No joint pain or mood swings.     Physical Exam:BP 133/82 (BP Location: Left Arm, Patient Position: Sitting, Cuff Size: Large)   Pulse 67   Ht 5' 4.5" (1.638 m)   Wt 206 lb (93.4 kg)   LMP 09/20/2015 (Exact Date)   BMI 34.81 kg/m   General:  Well developed, well nourished, no acute distress Skin:  Warm and dry Neck:  Midline trachea, normal thyroid, good ROM, no lymphadenopathy Lungs; Clear to auscultation bilaterally Breast:  No dominant palpable mass, retraction, or nipple discharge,sp reduction Cardiovascular: Regular rate and rhythm Abdomen:  Soft, non tender, no hepatosplenomegaly Pelvic:  External genitalia is normal in appearance, no lesions.  The vagina is pale.Urethra has no lesions or masses. The cervix and uterus are absent, vaginal pap with GC/CHL, and HR HPV genotyping performed.  No adnexal masses or tenderness noted.Bladder is non tender, no masses felt. Rectal: Good sphincter tone, no polyps, or hemorrhoids felt.  Hemoccult negative. Extremities/musculoskeletal:  No swelling or varicosities noted, no clubbing or cyanosis Psych:  No mood changes, alert and cooperative,seems happy AA is 0 Fall risk is moderate    04/06/2022   11:41 AM 04/03/2022    1:11 PM  03/13/2022   10:37 AM  Depression screen PHQ 2/9  Decreased Interest '3 1 3  '$ Down, Depressed, Hopeless '3 3 3  '$ PHQ - 2 Score '6 4 6  '$ Altered sleeping '2 3 1  '$ Tired, decreased energy '3 3 1  '$ Change in appetite 2 0 3  Feeling bad or failure about yourself  '3 3 3  '$ Trouble concentrating '3 3 2  '$ Moving slowly or fidgety/restless 0 3 1  Suicidal thoughts 0 0 0  PHQ-9 Score '19 19 17  '$ Difficult doing work/chores  Somewhat difficult Very difficult   She is on meds    04/06/2022   11:41 AM 03/25/2020    9:30 AM  GAD 7 : Generalized Anxiety Score  Nervous, Anxious, on Edge 3 0  Control/stop worrying 3 3  Worry too much - different things 3 3  Trouble relaxing 3 3  Restless 2 3  Easily annoyed or irritable 2 3  Afraid - awful might happen 0 1  Total GAD 7 Score 16 16  Anxiety Difficulty  Somewhat difficult      Upstream - 04/06/22 1152       Pregnancy Intention Screening   Does the patient want to become pregnant in the next year? N/A    Does the patient's partner want to become pregnant in the next year? N/A    Would the patient like to discuss contraceptive options today? N/A      Contraception Wrap Up   Current Method Female Sterilization   hyst   End Method Female Sterilization   hyst  Examination chaperoned by Levy Pupa LPN  Impression and Plan: 1. Encounter for well woman exam with routine gynecological exam Normal gyn exam Mammogram was negative, 06/06/21 Labs with PCP Physical with PCP  2. S/P hysterectomy with oophorectomy Pelvic exam in 1 year  3. Encounter for screening fecal occult blood testing Hemoccult was negative   4. Vaginal Pap smear following hysterectomy for malignancy History of endometrial cancer  Pap sent  Will get pelvic exam in 1 year

## 2022-04-10 LAB — CYTOLOGY - PAP
Chlamydia: NEGATIVE
Comment: NEGATIVE
Comment: NEGATIVE
Comment: NORMAL
Diagnosis: NEGATIVE
High risk HPV: NEGATIVE
Neisseria Gonorrhea: NEGATIVE

## 2022-04-11 ENCOUNTER — Telehealth: Payer: Self-pay | Admitting: *Deleted

## 2022-04-11 NOTE — Telephone Encounter (Signed)
No answer @ 8:52 am. JSY

## 2022-04-11 NOTE — Telephone Encounter (Signed)
-----   Message from Estill Dooms, NP sent at 04/10/2022  5:01 PM EDT ----- Please let her know pap negative for malignancy and HPV and GC/chlamydia

## 2022-04-11 NOTE — Telephone Encounter (Signed)
Left message @ 3:59 pm. JSY 

## 2022-04-13 NOTE — Telephone Encounter (Signed)
Pt aware pap was negative for malignancy and HPV. Also negative for GC/CHL. Pt voiced understanding. [place]

## 2022-04-21 ENCOUNTER — Other Ambulatory Visit: Payer: Self-pay | Admitting: Gastroenterology

## 2022-04-21 ENCOUNTER — Other Ambulatory Visit (HOSPITAL_COMMUNITY): Payer: Self-pay | Admitting: Internal Medicine

## 2022-04-21 ENCOUNTER — Other Ambulatory Visit: Payer: Self-pay | Admitting: Internal Medicine

## 2022-04-21 ENCOUNTER — Other Ambulatory Visit: Payer: Self-pay | Admitting: *Deleted

## 2022-04-21 DIAGNOSIS — Z1231 Encounter for screening mammogram for malignant neoplasm of breast: Secondary | ICD-10-CM

## 2022-04-21 MED ORDER — APIXABAN 5 MG PO TABS: 5.0000 mg | ORAL_TABLET | Freq: Two times a day (BID) | ORAL | 5 refills | Status: AC

## 2022-05-02 ENCOUNTER — Ambulatory Visit: Payer: Medicaid Other | Admitting: Orthopaedic Surgery

## 2022-05-09 ENCOUNTER — Ambulatory Visit: Payer: Medicaid Other | Admitting: Orthopaedic Surgery

## 2022-06-05 ENCOUNTER — Ambulatory Visit (HOSPITAL_COMMUNITY)
Admission: RE | Admit: 2022-06-05 | Discharge: 2022-06-05 | Disposition: A | Discharge status: Home / Self Care | Payer: Medicaid Other | Source: Ambulatory Visit | Attending: Internal Medicine | Admitting: Internal Medicine

## 2022-06-05 DIAGNOSIS — Z1231 Encounter for screening mammogram for malignant neoplasm of breast: Secondary | ICD-10-CM | POA: Insufficient documentation

## 2022-06-06 ENCOUNTER — Ambulatory Visit (INDEPENDENT_AMBULATORY_CARE_PROVIDER_SITE_OTHER): Payer: Medicaid Other | Admitting: Internal Medicine

## 2022-06-06 ENCOUNTER — Encounter: Payer: Self-pay | Admitting: Internal Medicine

## 2022-06-06 VITALS — BP 138/80 | HR 60 | Resp 18 | Ht 64.0 in | Wt 221.4 lb

## 2022-06-06 DIAGNOSIS — E782 Mixed hyperlipidemia: Secondary | ICD-10-CM

## 2022-06-06 DIAGNOSIS — Z794 Long term (current) use of insulin: Secondary | ICD-10-CM | POA: Diagnosis not present

## 2022-06-06 DIAGNOSIS — J449 Chronic obstructive pulmonary disease, unspecified: Secondary | ICD-10-CM

## 2022-06-06 DIAGNOSIS — J0100 Acute maxillary sinusitis, unspecified: Secondary | ICD-10-CM

## 2022-06-06 DIAGNOSIS — E1142 Type 2 diabetes mellitus with diabetic polyneuropathy: Secondary | ICD-10-CM | POA: Diagnosis not present

## 2022-06-06 DIAGNOSIS — I10 Essential (primary) hypertension: Secondary | ICD-10-CM

## 2022-06-06 MED ORDER — PROMETHAZINE-DM 6.25-15 MG/5ML PO SYRP: 5.0000 mL | ORAL_SOLUTION | Freq: Four times a day (QID) | ORAL | 0 refills | Status: DC | PRN

## 2022-06-06 MED ORDER — AZITHROMYCIN 250 MG PO TABS: ORAL_TABLET | ORAL | 0 refills | Status: AC

## 2022-06-06 MED ORDER — SPIRONOLACTONE 50 MG PO TABS: 50.0000 mg | ORAL_TABLET | Freq: Every day | ORAL | 1 refills | Status: DC

## 2022-06-06 NOTE — Assessment & Plan Note (Addendum)
Lab Results  Component Value Date   HGBA1C 9.3 (H) 03/06/2022   On Metformin, Jardiance and Lantus 34 U, followed by endocrinology Advised to follow diabetic diet On statin and ACEi Diabetic eye exam: Advised to follow up with Ophthalmology for diabetic eye exam  On Cymbalta for neuropathy

## 2022-06-06 NOTE — Patient Instructions (Signed)
Please take Spironolactone 50 mg instead of 25 mg.  Please take Lantus 34 U instead of 30 U.  Please continue taking other medications as prescribed.  Please continue to follow low carb diet and ambulate as tolerated.

## 2022-06-06 NOTE — Assessment & Plan Note (Signed)
Lipid profile reviewed Last cardiology note reviewed, referred to lipid clinic Currently on statin, plan to start PCSK9 inhibitor therapy 

## 2022-06-06 NOTE — Progress Notes (Signed)
Established Patient Office Visit  Subjective:  Patient ID: Sonya James, female    DOB: [birth year]  Age: 56 y.o. MRN: [id number]  CC:  Chief Complaint  Patient presents with   Follow-up    3 month follow up HTN and DM pt has had cough sore throat and runny nose since 05-29-22 has tried mucinex robitussin and vicks    HPI Sonya James is a 56 y.o. female with past medical history of HTN, type II DM, HLD, COPD, OSA, PE, GERD, obesity and tobacco abuse who presents for f/u of her chronic medical conditions.  She complains of nasal congestion, sore throat and cough for the last 1 week.  She denies any fever or chills currently.  She complains of dyspnea and wheezing, and has been using albuterol inhaler for her COPD.  Denies any hemoptysis.  HTN: Her BP is well controlled.  Her lisinopril was recently discontinued due to angioedema.  Her dose of spironolactone was increased to 50 mg, but her pharmacy has dispensed old dose of 25 mg.  She still takes amlodipine and labetalol.  She denies any headache, chest pain or palpitations.  Type II DM with HLD: She is on Lantus 30 units, but she was told to increase Lantus to 34 units, according to her endocrinology office visit note.  She has CGM, but has not synced it to her phone.  She also takes metformin and Jardiance.  She takes Lipitor for HLD.    Past Medical History:  Diagnosis Date   Anemia    Anxiety    Arthritis    Asthma 05/25/2013   Autonomic neuropathy    Cancer of endometrium ([place]) 10/18/2015   Cellulitis, face 05/03/2021   Constipation    COPD (chronic obstructive pulmonary disease) (HCC)    CTS (carpal tunnel syndrome)    Depression    Diabetes mellitus without complication ([place])    Dysphagia 12/14/2016   Encounter for screening colonoscopy    Endometrial cancer ([place]) 2017   Gastritis without bleeding    GERD (gastroesophageal reflux disease)    Gross hematuria 05/11/2019   Headache(784.0)    Heavy menses 10/11/2015    HTN (hypertension)    Hypercholesterolemia    Hypothyroidism    IBS (irritable bowel syndrome)    Osteoarthritis    osteoarthritis   Pancreatitis    per patient    Recurrent boils    buttocks and low back.   S/P laparoscopic hysterectomy 11/04/2015   Shortness of breath    Sleep apnea    CPAP machine   Uncontrolled type 2 diabetes mellitus with diabetic polyneuropathy, with long-term current use of insulin 08/04/2017   Uterine cancer ([place]) 2017   Vitamin D deficiency     Past Surgical History:  Procedure Laterality Date   ABDOMINAL HYSTERECTOMY  2018   endometrial cancer, surgery done at [place]  01/02/2017   Procedure: BIOPSY;  Surgeon: Danie Binder, MD;  [facility];  Service: Endoscopy;;  gastric biopsy   CESAREAN SECTION  1989   COLONOSCOPY WITH PROPOFOL N/A 01/02/2017   Dr. Oneida Alar: redundant colon, two 2-3 mm polyps in sigmoid colon (hyperplastic)   EAR CYST EXCISION Right 11/29/2021   Procedure: EXCISION OF EXTERNAL EAR LESION;  Surgeon: Jason Coop, DO;  Location: [facility];  Service: ENT;  Laterality: Right;   ESOPHAGOGASTRODUODENOSCOPY  05/22/2011   Dr. Oneida Alar: H.pylori gastritis    ESOPHAGOGASTRODUODENOSCOPY (EGD) WITH PROPOFOL N/A 01/02/2017  Dr. 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 s/p dilation, moderate gastritis (negative H.pylori)   FOOT SURGERY Left    tendon repair   IR US GUIDE VASC ACCESS LEFT  01/18/2021   OOPHORECTOMY     OTOPLASATY Left 06/15/2021   Procedure: Excision of left auricular lesion with endaural meatoplasty;  Surgeon: Jason Coop, DO;  Location: [facility];  Service: ENT;  Laterality: Left;   POLYPECTOMY  01/02/2017   Procedure: POLYPECTOMY;  Surgeon: Danie Binder, MD;  [facility];  Service: Endoscopy;;  sigmoid colon polyps times 2   REDUCTION MAMMAPLASTY  1996   SAVORY DILATION N/A 01/02/2017   Procedure: SAVORY DILATION;  Surgeon: Danie Binder, MD;  [facility];  Service:  Endoscopy;  Laterality: N/A;   TUBAL LIGATION      Family History  Problem Relation Age of Onset   Diabetes Mother    Hypertension Mother    COPD Mother    Asthma Mother    Hypercholesterolemia Mother    Hypertension Sister    Hyperlipidemia Sister    Diabetes Sister    Asthma Brother    Alcohol abuse Brother    Asthma Son    Cancer Maternal Grandmother        lung, throat, breast   Alzheimer's disease Maternal Grandmother    Hypertension Maternal Grandmother    Hypercholesterolemia Maternal Grandmother    Heart disease Maternal Grandfather    Stroke Maternal Grandfather    Clotting disorder Maternal Grandfather        blood clots in legs   Hypertension Sister    Hyperlipidemia Sister    Stroke Maternal Aunt    Clotting disorder Maternal Aunt        blood clots in legs   Hypertension Maternal Aunt    Hypercholesterolemia Maternal Aunt    Hypertension Maternal Aunt    Asthma Maternal Aunt    Clotting disorder Maternal Uncle        blood clot -legs traveled to heart and he died of heart attack   Colon cancer Neg Hx     Social History   Socioeconomic History   Marital status: Married    Spouse name: Not on file   Number of children: 1   Years of education: Not on file   Highest education level: Not on file  Occupational History   Not on file  Tobacco Use   Smoking status: Every Day    Packs/day: 1.50    Years: 22.00    Total pack years: 33.00    Types: Cigarettes   Smokeless tobacco: Never  Vaping Use   Vaping Use: Never used  Substance and Sexual Activity   Alcohol use: No   Drug use: No   Sexual activity: Not Currently    Birth control/protection: Surgical    Comment: hysterectomy  Other Topics Concern   Not on file  Social History Narrative   Lives with husband and she has been married to for 9 years.  Recently got evicted and has been living in a motel since last July.   She has 1 son that lives in [place].  Reports one grandbaby      Enjoys  watching TV mostly animal [place].      Diet: Eats all food groups   Caffeine: Soda not diet, green tea, coffee about 3 cups of caffeine per day.   Water: Drinks 5-6 8 ounce bottles throughout the day.      Wears a  seatbelt.  Currently does not have a vehicle.  Smoke detectors in the facility she is living in.  Does not have any weapons.   Social Determinants of Health   Financial Resource Strain: High Risk (04/06/2022)   Overall Financial Resource Strain (CARDIA)    Difficulty of Paying Living Expenses: Very hard  Food Insecurity: Food Insecurity Present (04/06/2022)   Hunger Vital Sign    Worried About Running Out of Food in the Last Year: Often true    Ran Out of Food in the Last Year: Often true  Transportation Needs: No Transportation Needs (04/06/2022)   PRAPARE - Hydrologist (Medical): No    Lack of Transportation (Non-Medical): No  Physical Activity: Insufficiently Active (04/06/2022)   Exercise Vital Sign    Days of Exercise per Week: 1 day    Minutes of Exercise per Session: 30 min  Stress: Stress Concern Present (04/06/2022)   [place]    Feeling of Stress : Rather much  Social Connections: Moderately Isolated (04/06/2022)   Social Connection and Isolation Panel [NHANES]    Frequency of Communication with Friends and Family: Once a week    Frequency of Social Gatherings with Friends and Family: Never    Attends Religious Services: 1 to 4 times per year    Active Member of [employer] or Organizations: No    Attends Archivist Meetings: Never    Marital Status: Married  Human resources officer Violence: Not At Risk (04/06/2022)   Humiliation, Afraid, Rape, and Kick questionnaire    Fear of Current or Ex-Partner: No    Emotionally Abused: No    Physically Abused: No    Sexually Abused: No    Outpatient Medications Prior to Visit  Medication Sig Dispense Refill   Accu-Chek  Softclix Lancets lancets To test glucose 4 times a day 150 each 2   albuterol (VENTOLIN HFA) 108 (90 Base) MCG/ACT inhaler Inhale 2 puffs into the lungs every 6 (six) hours as needed for wheezing or shortness of breath. 18 g 5   amLODipine (NORVASC) 10 MG tablet Take 1 tablet (10 mg total) by mouth daily. 90 tablet 1   apixaban (ELIQUIS) 5 MG TABS tablet Take 1 tablet (5 mg total) by mouth 2 (two) times daily. On 08/12/17 @ 9PM, start 1 tab (5 mg) two times daily. 60 tablet 5   atorvastatin (LIPITOR) 80 MG tablet Take 1 tablet (80 mg total) by mouth daily. 90 tablet 3   Blood Glucose Monitoring Suppl (ACCU-CHEK GUIDE ME) w/Device KIT 1 Piece by Does not apply route as directed. 1 kit 0   clotrimazole (GYNE-LOTRIMIN 3) 2 % vaginal cream Place 1 Applicatorful vaginally at bedtime. 21 g 0   Continuous Blood Gluc Sensor (DEXCOM G7 SENSOR) MISC 1 Device by Does not apply route as directed. 9 each 3   cycloSPORINE (RESTASIS) 0.05 % ophthalmic emulsion Place 1 drop into both eyes 2 (two) times daily.     DULoxetine (CYMBALTA) 60 MG capsule Take 60 mg by mouth daily.     empagliflozin (JARDIANCE) 25 MG TABS tablet Take 1 tablet (25 mg total) by mouth daily before breakfast. 90 tablet 3   esomeprazole (NEXIUM) 40 MG capsule TAKE 1 CAPSULE BY MOUTH TWICE DAILY BEFORE MEAL(S) 180 capsule 3   furosemide (LASIX) 40 MG tablet Take 1 tablet by mouth once daily 90 tablet 0   glucose blood (ACCU-CHEK GUIDE) test strip Test glucose 4  times a day 150 each 2   insulin glargine (LANTUS SOLOSTAR) 100 UNIT/ML Solostar Pen Inject 34 Units into the skin daily. 30 mL 6   Insulin Pen Needle 31G X 5 MM MISC 1 Device by Does not apply route daily in the afternoon. 100 each 2   ketoconazole (NIZORAL) 2 % cream Apply 1 Application topically daily. 30 g 0   labetalol (NORMODYNE) 100 MG tablet Take 1 tablet (100 mg total) by mouth 2 (two) times daily. 180 tablet 1   LINZESS 290 MCG CAPS capsule TAKE 1 CAPSULE BY MOUTH ONCE DAILY  BEFORE BREAKFAST 90 capsule 0   metFORMIN (GLUCOPHAGE) 1000 MG tablet Take 1 tablet (1,000 mg total) by mouth 2 (two) times daily with a meal. 90 tablet 3   Misc. Devices MISC Blood pressure monitoring device 1 each 0   montelukast (SINGULAIR) 10 MG tablet TAKE 1 TABLET BY MOUTH AT BEDTIME 90 tablet 0   ondansetron (ZOFRAN-ODT) 4 MG disintegrating tablet Take 4 mg by mouth every 6 (six) hours as needed for nausea or vomiting.     potassium chloride (KLOR-CON) 10 MEQ tablet TAKE 1 TABLET BY MOUTH ONCE DAILY IN THE EVENING 90 tablet 0   rizatriptan (MAXALT) 10 MG tablet Take 10 mg by mouth daily as needed for migraine.     Sod Picosulfate-Mag Ox-Cit Acd (CLENPIQ) 10-3.5-12 MG-GM -GM/175ML SOLN Take 1 kit by mouth as directed. 350 mL 0   topiramate (TOPAMAX) 25 MG tablet Take 50 mg by mouth at bedtime.     Vitamin D, Ergocalciferol, (DRISDOL) 1.25 MG (50000 UNIT) CAPS capsule Take 1 capsule by mouth once a week 8 capsule 0   spironolactone (ALDACTONE) 50 MG tablet Take 1 tablet (50 mg total) by mouth daily. 30 tablet 3   No facility-administered medications prior to visit.    Allergies  Allergen Reactions   Lisinopril Swelling    Swelling in face    ROS Review of Systems  Constitutional:  Negative for chills and fever.  HENT:  Positive for congestion, sinus pressure and sore throat. Negative for sinus pain.   Eyes:  Negative for pain and discharge.  Respiratory:  Positive for cough, shortness of breath and wheezing.   Cardiovascular:  Negative for chest pain and palpitations.  Gastrointestinal:  Negative for abdominal pain, diarrhea, nausea and vomiting.  Endocrine: Negative for polydipsia and polyuria.       Hot flashes  Genitourinary:  Negative for dysuria and hematuria.  Musculoskeletal:  Positive for back pain. Negative for neck pain and neck stiffness.  Skin:  Negative for rash.  Neurological:  Negative for dizziness and weakness.  Psychiatric/Behavioral:  Negative for agitation  and behavioral problems.       Objective:    Physical Exam Vitals reviewed.  Constitutional:      General: She is not in acute distress.    Appearance: She is obese. She is not diaphoretic.  HENT:     Head: Normocephalic and atraumatic.     Nose: Nose normal. No congestion.     Mouth/Throat:     Mouth: Mucous membranes are moist.     Pharynx: No posterior oropharyngeal erythema.  Eyes:     General: No scleral icterus.    Extraocular Movements: Extraocular movements intact.  Neck:     Vascular: No carotid bruit.  Cardiovascular:     Rate and Rhythm: Normal rate and regular rhythm.     Pulses: Normal pulses.     Heart sounds: Murmur (Systolic  over upper sternal border) heard.  Pulmonary:     Breath sounds: Wheezing (In upper lung fields) present. No rales.  Abdominal:     Palpations: Abdomen is soft.     Tenderness: There is no abdominal tenderness.  Musculoskeletal:     Cervical back: Neck supple. No tenderness.     Right lower leg: No edema.     Left lower leg: No edema.  Skin:    General: Skin is warm.     Findings: Lesion (Hyperkeratotic lesion, brownish, about 2 cm in diameter - likely seborrheic keratosis) present.     Comments: Papular lesions over thigh and right shoulder area, ruptured acneiform lesions  Neurological:     General: No focal deficit present.     Mental Status: She is alert and oriented to person, place, and time.     Sensory: No sensory deficit.     Motor: No weakness.  Psychiatric:        Mood and Affect: Mood normal.        Behavior: Behavior normal.     BP 138/80 (BP Location: Right Arm, Patient Position: Sitting, Cuff Size: Normal)   Pulse 60   Resp 18   Ht _0  (1.626 m)   Wt 221 lb 6.4 oz (100.4 kg)   LMP 09/20/2015 (Exact Date)   SpO2 98%   BMI 38.00 kg/m  Wt Readings from Last 3 Encounters:  06/06/22 221 lb 6.4 oz (100.4 kg)  04/06/22 206 lb (93.4 kg)  04/03/22 211 lb 3.2 oz (95.8 kg)    Lab Results  Component Value Date    TSH 1.800 03/06/2022   Lab Results  Component Value Date   WBC 6.5 04/02/2022   HGB 13.4 04/02/2022   HCT 42.5 04/02/2022   MCV 95.5 04/02/2022   PLT 351 04/02/2022   Lab Results  Component Value Date   NA 139 04/02/2022   K 4.2 04/02/2022   CO2 28 04/02/2022   GLUCOSE 118 (H) 04/02/2022   BUN 11 04/02/2022   CREATININE 0.64 04/02/2022   BILITOT 0.5 04/02/2022   ALKPHOS 106 04/02/2022   AST 11 (L) 04/02/2022   ALT 11 04/02/2022   PROT 7.3 04/02/2022   ALBUMIN 3.6 04/02/2022   CALCIUM 10.2 04/02/2022   ANIONGAP 3 (L) 04/02/2022   Lab Results  Component Value Date   CHOL 196 03/06/2022   Lab Results  Component Value Date   HDL 33 (L) 03/06/2022   Lab Results  Component Value Date   LDLCALC 141 (H) 03/06/2022   Lab Results  Component Value Date   TRIG 119 03/06/2022   Lab Results  Component Value Date   CHOLHDL 5.9 (H) 03/06/2022   Lab Results  Component Value Date   HGBA1C 9.3 (H) 03/06/2022      Assessment & Plan:   Problem List Items Addressed This Visit       Cardiovascular and Mediastinum   Essential hypertension, benign    BP Readings from Last 1 Encounters:  06/06/22 138/80  Usually well-controlled with Amlodipine 10 mg QD, Spironolactone 50 mg QD and Labetalol 100 mg BID Increased dose of Spironolactone to 50 mg recently, but needs to get new dose from pharmacy Counseled for compliance with the medications Advised DASH diet and moderate exercise/walking, at least 150 mins/week      Relevant Medications   spironolactone (ALDACTONE) 50 MG tablet     Respiratory   Chronic obstructive pulmonary disease ([place])    Well-controlled with Albuterol inhaler  PRN Recently has had dyspnea/wheezing in the setting of URTI      Relevant Medications   azithromycin (ZITHROMAX) 250 MG tablet   promethazine-dextromethorphan (PROMETHAZINE-DM) 6.25-15 MG/5ML syrup     Endocrine   Type 2 diabetes mellitus with diabetic polyneuropathy, with long-term  current use of insulin (HCC) - Primary    Lab Results  Component Value Date   HGBA1C 9.3 (H) 03/06/2022  On Metformin, Jardiance and Lantus 34 U, followed by endocrinology Advised to follow diabetic diet On statin and ACEi Diabetic eye exam: Advised to follow up with Ophthalmology for diabetic eye exam  On Cymbalta for neuropathy     Relevant Orders   Microalbumin / creatinine urine ratio   CMP14+EGFR   Hemoglobin A1c     Other   Mixed hyperlipidemia    Lipid profile reviewed Last cardiology note reviewed, referred to lipid clinic Currently on statin, plan to start PCSK9 inhibitor therapy      Relevant Medications   spironolactone (ALDACTONE) 50 MG tablet   Other Visit Diagnoses     Acute non-recurrent maxillary sinusitis Has persistent symptoms despite taking symptomatic treatment with Mucinex and Robitussin Started empiric azithromycin Promethazine DM syrup as needed for cough   Relevant Medications   azithromycin (ZITHROMAX) 250 MG tablet   promethazine-dextromethorphan (PROMETHAZINE-DM) 6.25-15 MG/5ML syrup       Meds ordered this encounter  Medications   spironolactone (ALDACTONE) 50 MG tablet    Sig: Take 1 tablet (50 mg total) by mouth daily.    Dispense:  90 tablet    Refill:  1    PLEASE CANCEL THE OLD RX OF 25 MG.   azithromycin (ZITHROMAX) 250 MG tablet    Sig: Take 2 tablets on day 1, then 1 tablet daily on days 2 through 5    Dispense:  6 tablet    Refill:  0   promethazine-dextromethorphan (PROMETHAZINE-DM) 6.25-15 MG/5ML syrup    Sig: Take 5 mLs by mouth 4 (four) times daily as needed for cough.    Dispense:  118 mL    Refill:  0    Follow-up: Return in about 4 months (around 10/06/2022) for DM and HTN.    Lindell Spar, MD

## 2022-06-06 NOTE — Assessment & Plan Note (Signed)
Well-controlled with Albuterol inhaler PRN Recently has had dyspnea/wheezing in the setting of URTI 

## 2022-06-06 NOTE — Assessment & Plan Note (Signed)
BP Readings from Last 1 Encounters:  06/06/22 138/80   Usually well-controlled with Amlodipine 10 mg QD, Spironolactone 50 mg QD and Labetalol 100 mg BID Increased dose of Spironolactone to 50 mg recently, but needs to get new dose from pharmacy Counseled for compliance with the medications Advised DASH diet and moderate exercise/walking, at least 150 mins/week

## 2022-06-07 LAB — CMP14+EGFR
ALT: 10 IU/L (ref 0–32)
AST: 11 IU/L (ref 0–40)
Albumin/Globulin Ratio: 1.3 (ref 1.2–2.2)
Albumin: 3.8 g/dL (ref 3.8–4.9)
Alkaline Phosphatase: 159 IU/L — ABNORMAL HIGH (ref 44–121)
BUN/Creatinine Ratio: 15 (ref 9–23)
BUN: 14 mg/dL (ref 6–24)
Bilirubin Total: 0.2 mg/dL (ref 0.0–1.2)
CO2: 25 mmol/L (ref 20–29)
Calcium: 10.4 mg/dL — ABNORMAL HIGH (ref 8.7–10.2)
Chloride: 104 mmol/L (ref 96–106)
Creatinine, Ser: 0.91 mg/dL (ref 0.57–1.00)
Globulin, Total: 2.9 g/dL (ref 1.5–4.5)
Glucose: 159 mg/dL — ABNORMAL HIGH (ref 70–99)
Potassium: 4.4 mmol/L (ref 3.5–5.2)
Sodium: 142 mmol/L (ref 134–144)
Total Protein: 6.7 g/dL (ref 6.0–8.5)
eGFR: 74 mL/min/{1.73_m2} (ref >59)

## 2022-06-07 LAB — HEMOGLOBIN A1C
Est. average glucose Bld gHb Est-mCnc: 189 mg/dL
Hgb A1c MFr Bld: 8.2 % — ABNORMAL HIGH (ref 4.8–5.6)

## 2022-06-08 LAB — MICROALBUMIN / CREATININE URINE RATIO
Creatinine, Urine: 122.5 mg/dL
Microalb/Creat Ratio: 8 mg/g creat (ref 0–29)
Microalbumin, Urine: 10.1 ug/mL

## 2022-06-16 DIAGNOSIS — H93292 Other abnormal auditory perceptions, left ear: Secondary | ICD-10-CM | POA: Diagnosis not present

## 2022-07-11 ENCOUNTER — Ambulatory Visit (INDEPENDENT_AMBULATORY_CARE_PROVIDER_SITE_OTHER): Payer: Medicaid Other | Admitting: Internal Medicine

## 2022-07-11 ENCOUNTER — Encounter: Payer: Self-pay | Admitting: Internal Medicine

## 2022-07-11 VITALS — BP 138/86 | HR 57 | Resp 18 | Ht 65.0 in | Wt 235.2 lb

## 2022-07-11 DIAGNOSIS — E1142 Type 2 diabetes mellitus with diabetic polyneuropathy: Secondary | ICD-10-CM

## 2022-07-11 DIAGNOSIS — M4726 Other spondylosis with radiculopathy, lumbar region: Secondary | ICD-10-CM

## 2022-07-11 DIAGNOSIS — Z23 Encounter for immunization: Secondary | ICD-10-CM

## 2022-07-11 DIAGNOSIS — G894 Chronic pain syndrome: Secondary | ICD-10-CM | POA: Diagnosis not present

## 2022-07-11 DIAGNOSIS — Z794 Long term (current) use of insulin: Secondary | ICD-10-CM

## 2022-07-11 DIAGNOSIS — R221 Localized swelling, mass and lump, neck: Secondary | ICD-10-CM | POA: Diagnosis not present

## 2022-07-11 MED ORDER — DULOXETINE HCL 60 MG PO CPEP: 60.0000 mg | ORAL_CAPSULE | Freq: Every day | ORAL | 1 refills | Status: DC

## 2022-07-11 MED ORDER — TRAMADOL HCL 50 MG PO TABS: 50.0000 mg | ORAL_TABLET | Freq: Two times a day (BID) | ORAL | 0 refills | Status: DC | PRN

## 2022-07-11 NOTE — Assessment & Plan Note (Signed)
Unclear etiology -could be cyst versus LAD Considering her history of recurrent otitis, would be cautious Check [place] of neck

## 2022-07-11 NOTE — Progress Notes (Signed)
Acute Office Visit  Subjective:    Patient ID: Sonya James, female    DOB: 10/15/1965, 56 y.o.   MRN: [id number]  Chief Complaint  Patient presents with   Acute Visit    Pt has lump on right side of neck noticed 07-08-22 wants to make sure nothing to worry about sore sometimes     HPI Patient is in today for evaluation of right-sided neck mass which she noticed about 3 days ago.  She denies any pain over the area.  Has not noticed any change in the size of the mass.  She has history of recurrent otitis externa, which has required multiple surgeries.  She currently denies any right-sided ear pain or discharge.  Denies any fever or chills.  Denies any nasal congestion or sore throat currently.  She also complains of chronic low back pain, for which she is taking Cymbalta currently.  She has taken Norco as needed for severe pain in the past.  She has history of lumbar spondylosis with radiculopathy. She used to see Barton Fanny at [facility] neurology, which is closing.  She requests a new referral to pain clinic.  She has history of type II DM with neuropathy.  She takes Cymbalta for neuropathy as well.   Past Medical History:  Diagnosis Date   Anemia    Anxiety    Arthritis    Asthma 05/25/2013   Autonomic neuropathy    Cancer of endometrium ([place]) 10/18/2015   Cellulitis, face 05/03/2021   Constipation    COPD (chronic obstructive pulmonary disease) (HCC)    CTS (carpal tunnel syndrome)    Depression    Diabetes mellitus without complication ([place])    Dysphagia 12/14/2016   Encounter for screening colonoscopy    Endometrial cancer ([place]) 2017   Gastritis without bleeding    GERD (gastroesophageal reflux disease)    Gross hematuria 05/11/2019   Headache(784.0)    Heavy menses 10/11/2015   HTN (hypertension)    Hypercholesterolemia    Hypothyroidism    IBS (irritable bowel syndrome)    Osteoarthritis    osteoarthritis   Pancreatitis    per patient    Recurrent boils     buttocks and low back.   S/P laparoscopic hysterectomy 11/04/2015   Shortness of breath    Sleep apnea    CPAP machine   Uncontrolled type 2 diabetes mellitus with diabetic polyneuropathy, with long-term current use of insulin 08/04/2017   Uterine cancer ([place]) 2017   Vitamin D deficiency     Past Surgical History:  Procedure Laterality Date   ABDOMINAL HYSTERECTOMY  2018   endometrial cancer, surgery done at [facility]  01/02/2017   Procedure: BIOPSY;  Surgeon: Danie Binder, MD;  [facility];  Service: Endoscopy;;  gastric biopsy   CESAREAN SECTION  1989   COLONOSCOPY WITH PROPOFOL N/A 01/02/2017   Dr. Oneida Alar: redundant colon, two 2-3 mm polyps in sigmoid colon (hyperplastic)   EAR CYST EXCISION Right 11/29/2021   Procedure: EXCISION OF EXTERNAL EAR LESION;  Surgeon: Jason Coop, DO;  Location: [facility];  Service: ENT;  Laterality: Right;   ESOPHAGOGASTRODUODENOSCOPY  05/22/2011   Dr. Oneida Alar: H.pylori gastritis    ESOPHAGOGASTRODUODENOSCOPY (EGD) WITH PROPOFOL N/A 01/02/2017   Dr. 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 s/p dilation, moderate gastritis (negative H.pylori)   FOOT SURGERY Left    tendon repair   IR US GUIDE VASC ACCESS LEFT  01/18/2021   OOPHORECTOMY  OTOPLASATY Left 06/15/2021   Procedure: Excision of left auricular lesion with endaural meatoplasty;  Surgeon: Jason Coop, DO;  Location: [facility];  Service: ENT;  Laterality: Left;   POLYPECTOMY  01/02/2017   Procedure: POLYPECTOMY;  Surgeon: Danie Binder, MD;  [facility];  Service: Endoscopy;;  sigmoid colon polyps times 2   REDUCTION MAMMAPLASTY  1996   SAVORY DILATION N/A 01/02/2017   Procedure: SAVORY DILATION;  Surgeon: Danie Binder, MD;  [facility];  Service: Endoscopy;  Laterality: N/A;   TUBAL LIGATION      Family History  Problem Relation Age of Onset   Diabetes Mother    Hypertension Mother    COPD Mother    Asthma Mother     Hypercholesterolemia Mother    Hypertension Sister    Hyperlipidemia Sister    Diabetes Sister    Asthma Brother    Alcohol abuse Brother    Asthma Son    Cancer Maternal Grandmother        lung, throat, breast   Alzheimer's disease Maternal Grandmother    Hypertension Maternal Grandmother    Hypercholesterolemia Maternal Grandmother    Heart disease Maternal Grandfather    Stroke Maternal Grandfather    Clotting disorder Maternal Grandfather        blood clots in legs   Hypertension Sister    Hyperlipidemia Sister    Stroke Maternal Aunt    Clotting disorder Maternal Aunt        blood clots in legs   Hypertension Maternal Aunt    Hypercholesterolemia Maternal Aunt    Hypertension Maternal Aunt    Asthma Maternal Aunt    Clotting disorder Maternal Uncle        blood clot -legs traveled to heart and he died of heart attack   Colon cancer Neg Hx     Social History   Socioeconomic History   Marital status: Married    Spouse name: Not on file   Number of children: 1   Years of education: Not on file   Highest education level: Not on file  Occupational History   Not on file  Tobacco Use   Smoking status: Every Day    Packs/day: 1.50    Years: 22.00    Total pack years: 33.00    Types: Cigarettes   Smokeless tobacco: Never  Vaping Use   Vaping Use: Never used  Substance and Sexual Activity   Alcohol use: No   Drug use: No   Sexual activity: Not Currently    Birth control/protection: Surgical    Comment: hysterectomy  Other Topics Concern   Not on file  Social History Narrative   Lives with husband and she has been married to for 9 years.  Recently got evicted and has been living in a motel since last July.   She has 1 son that lives in [place].  Reports one grandbaby      Enjoys watching TV mostly animal [place].      Diet: Eats all food groups   Caffeine: Soda not diet, green tea, coffee about 3 cups of caffeine per day.   Water: Drinks 5-6 8 ounce  bottles throughout the day.      Wears a seatbelt.  Currently does not have a vehicle.  Smoke detectors in the facility she is living in.  Does not have any weapons.   Social Determinants of Health   Financial Resource Strain: High Risk (04/06/2022)  Overall Financial Resource Strain (CARDIA)    Difficulty of Paying Living Expenses: Very hard  Food Insecurity: Food Insecurity Present (04/06/2022)   Hunger Vital Sign    Worried About Running Out of Food in the Last Year: Often true    Ran Out of Food in the Last Year: Often true  Transportation Needs: No Transportation Needs (04/06/2022)   PRAPARE - Hydrologist (Medical): No    Lack of Transportation (Non-Medical): No  Physical Activity: Insufficiently Active (04/06/2022)   Exercise Vital Sign    Days of Exercise per Week: 1 day    Minutes of Exercise per Session: 30 min  Stress: Stress Concern Present (04/06/2022)   [place]    Feeling of Stress : Rather much  Social Connections: Moderately Isolated (04/06/2022)   Social Connection and Isolation Panel [NHANES]    Frequency of Communication with Friends and Family: Once a week    Frequency of Social Gatherings with Friends and Family: Never    Attends Religious Services: 1 to 4 times per year    Active Member of [employer] or Organizations: No    Attends Archivist Meetings: Never    Marital Status: Married  Human resources officer Violence: Not At Risk (04/06/2022)   Humiliation, Afraid, Rape, and Kick questionnaire    Fear of Current or Ex-Partner: No    Emotionally Abused: No    Physically Abused: No    Sexually Abused: No    Outpatient Medications Prior to Visit  Medication Sig Dispense Refill   Accu-Chek Softclix Lancets lancets To test glucose 4 times a day 150 each 2   albuterol (VENTOLIN HFA) 108 (90 Base) MCG/ACT inhaler Inhale 2 puffs into the lungs every 6 (six) hours as  needed for wheezing or shortness of breath. 18 g 5   amLODipine (NORVASC) 10 MG tablet Take 1 tablet (10 mg total) by mouth daily. 90 tablet 1   apixaban (ELIQUIS) 5 MG TABS tablet Take 1 tablet (5 mg total) by mouth 2 (two) times daily. On 08/12/17 @ 9PM, start 1 tab (5 mg) two times daily. 60 tablet 5   atorvastatin (LIPITOR) 80 MG tablet Take 1 tablet (80 mg total) by mouth daily. 90 tablet 3   Blood Glucose Monitoring Suppl (ACCU-CHEK GUIDE ME) w/Device KIT 1 Piece by Does not apply route as directed. 1 kit 0   clotrimazole (GYNE-LOTRIMIN 3) 2 % vaginal cream Place 1 Applicatorful vaginally at bedtime. 21 g 0   Continuous Blood Gluc Sensor (DEXCOM G7 SENSOR) MISC 1 Device by Does not apply route as directed. 9 each 3   cycloSPORINE (RESTASIS) 0.05 % ophthalmic emulsion Place 1 drop into both eyes 2 (two) times daily.     empagliflozin (JARDIANCE) 25 MG TABS tablet Take 1 tablet (25 mg total) by mouth daily before breakfast. 90 tablet 3   esomeprazole (NEXIUM) 40 MG capsule TAKE 1 CAPSULE BY MOUTH TWICE DAILY BEFORE MEAL(S) 180 capsule 3   furosemide (LASIX) 40 MG tablet Take 1 tablet by mouth once daily 90 tablet 0   glucose blood (ACCU-CHEK GUIDE) test strip Test glucose 4 times a day 150 each 2   insulin glargine (LANTUS SOLOSTAR) 100 UNIT/ML Solostar Pen Inject 34 Units into the skin daily. 30 mL 6   Insulin Pen Needle 31G X 5 MM MISC 1 Device by Does not apply route daily in the afternoon. 100 each 2   ketoconazole (NIZORAL) 2 %  cream Apply 1 Application topically daily. 30 g 0   labetalol (NORMODYNE) 100 MG tablet Take 1 tablet (100 mg total) by mouth 2 (two) times daily. 180 tablet 1   LINZESS 290 MCG CAPS capsule TAKE 1 CAPSULE BY MOUTH ONCE DAILY BEFORE BREAKFAST 90 capsule 0   metFORMIN (GLUCOPHAGE) 1000 MG tablet Take 1 tablet (1,000 mg total) by mouth 2 (two) times daily with a meal. 90 tablet 3   Misc. Devices MISC Blood pressure monitoring device 1 each 0   montelukast (SINGULAIR)  10 MG tablet TAKE 1 TABLET BY MOUTH AT BEDTIME 90 tablet 0   ondansetron (ZOFRAN-ODT) 4 MG disintegrating tablet Take 4 mg by mouth every 6 (six) hours as needed for nausea or vomiting.     potassium chloride (KLOR-CON) 10 MEQ tablet TAKE 1 TABLET BY MOUTH ONCE DAILY IN THE EVENING 90 tablet 0   promethazine-dextromethorphan (PROMETHAZINE-DM) 6.25-15 MG/5ML syrup Take 5 mLs by mouth 4 (four) times daily as needed for cough. 118 mL 0   rizatriptan (MAXALT) 10 MG tablet Take 10 mg by mouth daily as needed for migraine.     Sod Picosulfate-Mag Ox-Cit Acd (CLENPIQ) 10-3.5-12 MG-GM -GM/175ML SOLN Take 1 kit by mouth as directed. 350 mL 0   spironolactone (ALDACTONE) 50 MG tablet Take 1 tablet (50 mg total) by mouth daily. 90 tablet 1   topiramate (TOPAMAX) 25 MG tablet Take 50 mg by mouth at bedtime.     Vitamin D, Ergocalciferol, (DRISDOL) 1.25 MG (50000 UNIT) CAPS capsule Take 1 capsule by mouth once a week 8 capsule 0   DULoxetine (CYMBALTA) 60 MG capsule Take 60 mg by mouth daily.     No facility-administered medications prior to visit.    Allergies  Allergen Reactions   Lisinopril Swelling    Swelling in face    Review of Systems  Constitutional:  Negative for chills and fever.  HENT:  Negative for congestion, sinus pressure, sinus pain and sore throat.        Right-sided neck mass  Eyes:  Negative for pain and discharge.  Respiratory:  Negative for cough and shortness of breath.   Cardiovascular:  Negative for chest pain and palpitations.  Gastrointestinal:  Negative for abdominal pain, diarrhea, nausea and vomiting.  Endocrine: Negative for polydipsia and polyuria.       Hot flashes  Genitourinary:  Negative for dysuria and hematuria.  Musculoskeletal:  Positive for back pain. Negative for neck pain and neck stiffness.  Skin:  Negative for rash.  Neurological:  Negative for dizziness and weakness.  Psychiatric/Behavioral:  Negative for agitation and behavioral problems.         Objective:    Physical Exam Vitals reviewed.  Constitutional:      General: She is not in acute distress.    Appearance: She is obese. She is not diaphoretic.  HENT:     Head: Normocephalic and atraumatic.     Nose: Nose normal. No congestion.     Mouth/Throat:     Mouth: Mucous membranes are moist.     Pharynx: No posterior oropharyngeal erythema.  Eyes:     General: No scleral icterus.    Extraocular Movements: Extraocular movements intact.  Neck:     Comments: Right-sided upper neck mass, below TM joint - round, about 2 cm in diameter, nontender Cardiovascular:     Rate and Rhythm: Normal rate and regular rhythm.     Pulses: Normal pulses.     Heart sounds: Murmur (Systolic over  upper sternal border) heard.  Pulmonary:     Breath sounds: No wheezing or rales.  Musculoskeletal:     Cervical back: Neck supple. No tenderness.     Right lower leg: No edema.     Left lower leg: No edema.  Skin:    General: Skin is warm.     Comments: Papular lesions over thigh and right shoulder area, ruptured acneiform lesions  Neurological:     General: No focal deficit present.     Mental Status: She is alert and oriented to person, place, and time.     Sensory: No sensory deficit.     Motor: No weakness.  Psychiatric:        Mood and Affect: Mood normal.        Behavior: Behavior normal.     BP 138/86 (BP Location: Left Arm, Patient Position: Sitting, Cuff Size: Normal)   Pulse (!) 57   Resp 18   Ht _0  (1.651 m)   Wt 235 lb 3.2 oz (106.7 kg)   LMP 09/20/2015 (Exact Date)   SpO2 99%   BMI 39.14 kg/m  Wt Readings from Last 3 Encounters:  07/11/22 235 lb 3.2 oz (106.7 kg)  06/06/22 221 lb 6.4 oz (100.4 kg)  04/06/22 206 lb (93.4 kg)        Assessment & Plan:   Problem List Items Addressed This Visit       Endocrine   Type 2 diabetes mellitus with diabetic polyneuropathy, with long-term current use of insulin (HCC)    Lab Results  Component Value Date   HGBA1C  8.2 (H) 06/06/2022  On Metformin, Jardiance and Lantus 34 U, followed by endocrinology Advised to follow diabetic diet On statin and ACEi Diabetic eye exam: Advised to follow up with Ophthalmology for diabetic eye exam  On Cymbalta for neuropathy      Relevant Medications   DULoxetine (CYMBALTA) 60 MG capsule     Nervous and Auditory   Other spondylosis with radiculopathy, lumbar region    Has chronic low back pain On Cymbalta - refilled Has been on Norco Used to see [facility] neurology Referred to pain clinic in [place] Tramadol as needed for severe pain Avoid heavy lifting and frequent bending      Relevant Medications   DULoxetine (CYMBALTA) 60 MG capsule   traMADol (ULTRAM) 50 MG tablet   Other Relevant Orders   Ambulatory referral to Pain Clinic     Other   Chronic pain syndrome    Likely due to lumbar spondylosis with radiculopathy Likely has OA of multiple joints as well Has been on Norco in the past Tramadol as needed for severe pain for now Referred to [facility] pain clinic in [place]      Relevant Medications   DULoxetine (CYMBALTA) 60 MG capsule   traMADol (ULTRAM) 50 MG tablet   Neck mass - Primary    Unclear etiology -could be cyst versus LAD Considering her history of recurrent otitis, would be cautious Check [place] of neck      Relevant Orders   US Soft Tissue Head/Neck (NON-THYROID)   Other Visit Diagnoses     Need for immunization against influenza       Relevant Orders   Flu Vaccine QUAD 56moIM (Fluarix, Fluzone & Alfiuria Quad PF) (Completed)        Meds ordered this encounter  Medications   DULoxetine (CYMBALTA) 60 MG capsule    Sig: Take 1 capsule (60 mg total) by mouth  daily.    Dispense:  90 capsule    Refill:  1   traMADol (ULTRAM) 50 MG tablet    Sig: Take 1 tablet (50 mg total) by mouth every 12 (twelve) hours as needed for severe pain.    Dispense:  30 tablet    Refill:  0     Matson Welch Keith Rake, MD

## 2022-07-11 NOTE — Progress Notes (Signed)
.  acute

## 2022-07-11 NOTE — Patient Instructions (Addendum)
Please take Tramadol as needed for severe back pain. Please continue taking Duloxetine as prescribed.  Please continue taking medications as prescribed.

## 2022-07-11 NOTE — Assessment & Plan Note (Signed)
Lab Results  Component Value Date   HGBA1C 8.2 (H) 06/06/2022   On Metformin, Jardiance and Lantus 34 U, followed by endocrinology Advised to follow diabetic diet On statin and ACEi Diabetic eye exam: Advised to follow up with Ophthalmology for diabetic eye exam  On Cymbalta for neuropathy

## 2022-07-11 NOTE — Assessment & Plan Note (Signed)
Has chronic low back pain On Cymbalta - refilled Has been on Norco Used to see [facility] neurology Referred to pain clinic in [place] Tramadol as needed for severe pain Avoid heavy lifting and frequent bending

## 2022-07-11 NOTE — Assessment & Plan Note (Signed)
Likely due to lumbar spondylosis with radiculopathy Likely has OA of multiple joints as well Has been on Norco in the past Tramadol as needed for severe pain for now Referred to [facility] pain clinic in [place]

## 2022-07-17 ENCOUNTER — Ambulatory Visit (HOSPITAL_COMMUNITY)
Admission: RE | Admit: 2022-07-17 | Discharge: 2022-07-17 | Disposition: A | Discharge status: Home / Self Care | Payer: Medicaid Other | Source: Ambulatory Visit | Attending: Internal Medicine | Admitting: Internal Medicine

## 2022-07-17 DIAGNOSIS — M542 Cervicalgia: Secondary | ICD-10-CM | POA: Diagnosis not present

## 2022-07-17 DIAGNOSIS — R221 Localized swelling, mass and lump, neck: Secondary | ICD-10-CM | POA: Diagnosis present

## 2022-07-18 ENCOUNTER — Encounter: Payer: Self-pay | Admitting: Orthopaedic Surgery

## 2022-07-18 ENCOUNTER — Ambulatory Visit (INDEPENDENT_AMBULATORY_CARE_PROVIDER_SITE_OTHER): Payer: Medicaid Other | Admitting: Orthopaedic Surgery

## 2022-07-18 DIAGNOSIS — M25562 Pain in left knee: Secondary | ICD-10-CM | POA: Diagnosis not present

## 2022-07-18 DIAGNOSIS — G8929 Other chronic pain: Secondary | ICD-10-CM | POA: Diagnosis not present

## 2022-07-18 DIAGNOSIS — M25561 Pain in right knee: Secondary | ICD-10-CM

## 2022-07-18 NOTE — Progress Notes (Signed)
PROCEDURE NOTE:  The patient requests injections of the right knee , verbal consent was obtained.  The right knee was prepped appropriately after time out was performed.   Sterile technique was observed and injection of 1 cc of DepoMedrol '40mg'$  with several cc's of plain xylocaine. Anesthesia was provided by ethyl chloride and a 20-gauge needle was used to inject the knee area. The injection was tolerated well.  A band aid dressing was applied.  The patient was advised to apply ice later today and tomorrow to the injection sight as needed. PROCEDURE NOTE:  The patient requests injections of the left knee , verbal consent was obtained.  The left knee was prepped appropriately after time out was performed.   Sterile technique was observed and injection of 1 cc of DepoMedrol 40 mg with several cc's of plain xylocaine. Anesthesia was provided by ethyl chloride and a 20-gauge needle was used to inject the knee area. The injection was tolerated well.  A band aid dressing was applied.  The patient was advised to apply ice later today and tomorrow to the injection sight as needed.  Encounter Diagnosis  Name Primary?   Chronic pain of both knees Yes   Return in six weeks.  Call if any problem.  Precautions discussed.  Electronically Signed Sanjuana Kava, MD 11/7/20231:39 PM

## 2022-07-24 ENCOUNTER — Telehealth: Payer: Self-pay | Admitting: Internal Medicine

## 2022-07-24 NOTE — Telephone Encounter (Signed)
Patient returning image results

## 2022-07-24 NOTE — Telephone Encounter (Signed)
Returned call and advised results.

## 2022-08-11 ENCOUNTER — Other Ambulatory Visit: Payer: Self-pay | Admitting: Internal Medicine

## 2022-08-29 ENCOUNTER — Ambulatory Visit: Payer: Medicaid Other | Admitting: Orthopaedic Surgery

## 2022-09-14 ENCOUNTER — Other Ambulatory Visit: Payer: Self-pay | Admitting: Internal Medicine

## 2022-09-18 ENCOUNTER — Other Ambulatory Visit: Payer: Self-pay | Admitting: Internal Medicine

## 2022-09-18 DIAGNOSIS — M4726 Other spondylosis with radiculopathy, lumbar region: Secondary | ICD-10-CM

## 2022-09-18 MED ORDER — TRAMADOL HCL 50 MG PO TABS: 50.0000 mg | ORAL_TABLET | Freq: Two times a day (BID) | ORAL | 0 refills | Status: DC | PRN

## 2022-09-21 ENCOUNTER — Encounter: Payer: Self-pay | Admitting: Internal Medicine

## 2022-09-21 ENCOUNTER — Ambulatory Visit (INDEPENDENT_AMBULATORY_CARE_PROVIDER_SITE_OTHER): Payer: Medicaid Other | Admitting: Internal Medicine

## 2022-09-21 VITALS — BP 88/67 | HR 80 | Ht 65.0 in | Wt 224.4 lb

## 2022-09-21 DIAGNOSIS — L729 Follicular cyst of the skin and subcutaneous tissue, unspecified: Secondary | ICD-10-CM | POA: Diagnosis not present

## 2022-09-21 DIAGNOSIS — Z720 Tobacco use: Secondary | ICD-10-CM

## 2022-09-21 DIAGNOSIS — L089 Local infection of the skin and subcutaneous tissue, unspecified: Secondary | ICD-10-CM

## 2022-09-21 DIAGNOSIS — I10 Essential (primary) hypertension: Secondary | ICD-10-CM

## 2022-09-21 MED ORDER — CEPHALEXIN 500 MG PO CAPS: 500.0000 mg | ORAL_CAPSULE | Freq: Three times a day (TID) | ORAL | 0 refills | Status: DC

## 2022-09-21 MED ORDER — LABETALOL HCL 100 MG PO TABS: 50.0000 mg | ORAL_TABLET | Freq: Two times a day (BID) | ORAL | 1 refills | Status: DC

## 2022-09-21 NOTE — Assessment & Plan Note (Signed)
Smokes about 2-3 cigarettes per day  Asked about quitting: confirms that he/she currently smokes cigarettes Advise to quit smoking: Educated about QUITTING to reduce the risk of cancer, cardio and cerebrovascular disease. Assess willingness: Unwilling to quit at this time, but is working on cutting back. Assist with counseling and pharmacotherapy: Counseled for 5 minutes and literature provided. Arrange for follow up: follow up in 3 months and continue to offer help.

## 2022-09-21 NOTE — Patient Instructions (Addendum)
Please take Labetalol only half tablet twice daily.  Please start taking Cephalexin for infected cyst. Please keep area clean and dry.

## 2022-09-21 NOTE — Assessment & Plan Note (Signed)
BP Readings from Last 1 Encounters:  09/21/22 (!) 88/67   Usually well-controlled with Amlodipine 10 mg QD, Spironolactone 50 mg QD and Labetalol 100 mg BID But has low normal BP today - decreased dose of labetalol to 50 mg twice daily Counseled for compliance with the medications Advised DASH diet and moderate exercise/walking, at least 150 mins/week

## 2022-09-21 NOTE — Assessment & Plan Note (Signed)
Lesion on the right side of face likely infected cyst Started Keflex Referred to dermatology as she has had recurrent facial lesions

## 2022-09-21 NOTE — Progress Notes (Signed)
Acute Office Visit  Subjective:    Patient ID: Sonya James, female    DOB: Mar 12, 1966, 57 y.o.   MRN: [id number]  Chief Complaint  Patient presents with   Cyst    Patient has knots on head and above her eye , she also states she has been having headaches    HPI Patient is in today for complaint of noticing bumps on her face for the last 1 week.  She has a cystic-like lesion on the right side of the face near the hairline.  She reports a bump over right eyebrow and on the left side of the face as well, which not clearly palpable.  She denies any drainage from the bumps.  Denies any recent fever or chills.  She has been struggling to lose weight.  She has tried following low-carb diet.  She is interested in bariatric surgery.  She has lost more than 100 lbs with diet modification in the past, as she states that her weight was close to 400 lbs in the past.  Her BP was borderline low today.  She has started taking her amlodipine, labetalol and spironolactone regularly.  She currently denies any dizziness, chest pain, dyspnea or palpitations.  Past Medical History:  Diagnosis Date   Anemia    Anxiety    Arthritis    Asthma 05/25/2013   Autonomic neuropathy    Cancer of endometrium ([place]) 10/18/2015   Cellulitis, face 05/03/2021   Constipation    COPD (chronic obstructive pulmonary disease) (HCC)    CTS (carpal tunnel syndrome)    Depression    Diabetes mellitus without complication ([place])    Dysphagia 12/14/2016   Encounter for screening colonoscopy    Endometrial cancer ([place]) 2017   Gastritis without bleeding    GERD (gastroesophageal reflux disease)    Gross hematuria 05/11/2019   Headache(784.0)    Heavy menses 10/11/2015   HTN (hypertension)    Hypercholesterolemia    Hypothyroidism    IBS (irritable bowel syndrome)    Osteoarthritis    osteoarthritis   Pancreatitis    per patient    Recurrent boils    buttocks and low back.   S/P laparoscopic hysterectomy  11/04/2015   Shortness of breath    Sleep apnea    CPAP machine   Uncontrolled type 2 diabetes mellitus with diabetic polyneuropathy, with long-term current use of insulin 08/04/2017   Uterine cancer ([place]) 2017   Vitamin D deficiency     Past Surgical History:  Procedure Laterality Date   ABDOMINAL HYSTERECTOMY  2018   endometrial cancer, surgery done at [facility]  01/02/2017   Procedure: BIOPSY;  Surgeon: Danie Binder, MD;  [facility];  Service: Endoscopy;;  gastric biopsy   CESAREAN SECTION  1989   COLONOSCOPY WITH PROPOFOL N/A 01/02/2017   Dr. Oneida Alar: redundant colon, two 2-3 mm polyps in sigmoid colon (hyperplastic)   EAR CYST EXCISION Right 11/29/2021   Procedure: EXCISION OF EXTERNAL EAR LESION;  Surgeon: Jason Coop, DO;  Location: [facility];  Service: ENT;  Laterality: Right;   ESOPHAGOGASTRODUODENOSCOPY  05/22/2011   Dr. Oneida Alar: H.pylori gastritis    ESOPHAGOGASTRODUODENOSCOPY (EGD) WITH PROPOFOL N/A 01/02/2017   Dr. 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 s/p dilation, moderate gastritis (negative H.pylori)   FOOT SURGERY Left    tendon repair   IR US GUIDE VASC ACCESS LEFT  01/18/2021   OOPHORECTOMY     OTOPLASATY Left 06/15/2021  Procedure: Excision of left auricular lesion with endaural meatoplasty;  Surgeon: Jason Coop, DO;  Location: [facility];  Service: ENT;  Laterality: Left;   POLYPECTOMY  01/02/2017   Procedure: POLYPECTOMY;  Surgeon: Danie Binder, MD;  [facility];  Service: Endoscopy;;  sigmoid colon polyps times 2   REDUCTION MAMMAPLASTY  1996   SAVORY DILATION N/A 01/02/2017   Procedure: SAVORY DILATION;  Surgeon: Danie Binder, MD;  [facility];  Service: Endoscopy;  Laterality: N/A;   TUBAL LIGATION      Family History  Problem Relation Age of Onset   Diabetes Mother    Hypertension Mother    COPD Mother    Asthma Mother    Hypercholesterolemia Mother    Hypertension Sister     Hyperlipidemia Sister    Diabetes Sister    Asthma Brother    Alcohol abuse Brother    Asthma Son    Cancer Maternal Grandmother        lung, throat, breast   Alzheimer's disease Maternal Grandmother    Hypertension Maternal Grandmother    Hypercholesterolemia Maternal Grandmother    Heart disease Maternal Grandfather    Stroke Maternal Grandfather    Clotting disorder Maternal Grandfather        blood clots in legs   Hypertension Sister    Hyperlipidemia Sister    Stroke Maternal Aunt    Clotting disorder Maternal Aunt        blood clots in legs   Hypertension Maternal Aunt    Hypercholesterolemia Maternal Aunt    Hypertension Maternal Aunt    Asthma Maternal Aunt    Clotting disorder Maternal Uncle        blood clot -legs traveled to heart and he died of heart attack   Colon cancer Neg Hx     Social History   Socioeconomic History   Marital status: Married    Spouse name: Not on file   Number of children: 1   Years of education: Not on file   Highest education level: Not on file  Occupational History   Not on file  Tobacco Use   Smoking status: Every Day    Packs/day: 1.50    Years: 22.00    Total pack years: 33.00    Types: Cigarettes   Smokeless tobacco: Never  Vaping Use   Vaping Use: Never used  Substance and Sexual Activity   Alcohol use: No   Drug use: No   Sexual activity: Not Currently    Birth control/protection: Surgical    Comment: hysterectomy  Other Topics Concern   Not on file  Social History Narrative   Lives with husband and she has been married to for 9 years.  Recently got evicted and has been living in a motel since last July.   She has 1 son that lives in [place].  Reports one grandbaby      Enjoys watching TV mostly animal [place].      Diet: Eats all food groups   Caffeine: Soda not diet, green tea, coffee about 3 cups of caffeine per day.   Water: Drinks 5-6 8 ounce bottles throughout the day.      Wears a seatbelt.   Currently does not have a vehicle.  Smoke detectors in the facility she is living in.  Does not have any weapons.   Social Determinants of Health   Financial Resource Strain: High Risk (04/06/2022)   Overall Financial Resource Strain (CARDIA)  Difficulty of Paying Living Expenses: Very hard  Food Insecurity: Food Insecurity Present (04/06/2022)   Hunger Vital Sign    Worried About Running Out of Food in the Last Year: Often true    Ran Out of Food in the Last Year: Often true  Transportation Needs: No Transportation Needs (04/06/2022)   PRAPARE - Hydrologist (Medical): No    Lack of Transportation (Non-Medical): No  Physical Activity: Insufficiently Active (04/06/2022)   Exercise Vital Sign    Days of Exercise per Week: 1 day    Minutes of Exercise per Session: 30 min  Stress: Stress Concern Present (04/06/2022)   [place]    Feeling of Stress : Rather much  Social Connections: Moderately Isolated (04/06/2022)   Social Connection and Isolation Panel [NHANES]    Frequency of Communication with Friends and Family: Once a week    Frequency of Social Gatherings with Friends and Family: Never    Attends Religious Services: 1 to 4 times per year    Active Member of [employer] or Organizations: No    Attends Archivist Meetings: Never    Marital Status: Married  Human resources officer Violence: Not At Risk (04/06/2022)   Humiliation, Afraid, Rape, and Kick questionnaire    Fear of Current or Ex-Partner: No    Emotionally Abused: No    Physically Abused: No    Sexually Abused: No    Outpatient Medications Prior to Visit  Medication Sig Dispense Refill   Accu-Chek Softclix Lancets lancets To test glucose 4 times a day 150 each 2   albuterol (VENTOLIN HFA) 108 (90 Base) MCG/ACT inhaler Inhale 2 puffs into the lungs every 6 (six) hours as needed for wheezing or shortness of breath. 18 g 5    amLODipine (NORVASC) 10 MG tablet Take 1 tablet (10 mg total) by mouth daily. 90 tablet 1   apixaban (ELIQUIS) 5 MG TABS tablet Take 1 tablet (5 mg total) by mouth 2 (two) times daily. On 08/12/17 @ 9PM, start 1 tab (5 mg) two times daily. 60 tablet 5   atorvastatin (LIPITOR) 80 MG tablet Take 1 tablet (80 mg total) by mouth daily. 90 tablet 3   Blood Glucose Monitoring Suppl (ACCU-CHEK GUIDE ME) w/Device KIT 1 Piece by Does not apply route as directed. 1 kit 0   clotrimazole (GYNE-LOTRIMIN 3) 2 % vaginal cream Place 1 Applicatorful vaginally at bedtime. 21 g 0   Continuous Blood Gluc Sensor (DEXCOM G7 SENSOR) MISC 1 Device by Does not apply route as directed. 9 each 3   cycloSPORINE (RESTASIS) 0.05 % ophthalmic emulsion Place 1 drop into both eyes 2 (two) times daily.     DULoxetine (CYMBALTA) 60 MG capsule Take 1 capsule (60 mg total) by mouth daily. 90 capsule 1   empagliflozin (JARDIANCE) 25 MG TABS tablet Take 1 tablet (25 mg total) by mouth daily before breakfast. 90 tablet 3   esomeprazole (NEXIUM) 40 MG capsule TAKE 1 CAPSULE BY MOUTH TWICE DAILY BEFORE MEAL(S) 180 capsule 3   furosemide (LASIX) 40 MG tablet Take 1 tablet by mouth once daily 90 tablet 0   glucose blood (ACCU-CHEK GUIDE) test strip Test glucose 4 times a day 150 each 2   insulin glargine (LANTUS SOLOSTAR) 100 UNIT/ML Solostar Pen Inject 34 Units into the skin daily. 30 mL 6   Insulin Pen Needle 31G X 5 MM MISC 1 Device by Does not apply route daily  in the afternoon. 100 each 2   ketoconazole (NIZORAL) 2 % cream Apply 1 Application topically daily. 30 g 0   LINZESS 290 MCG CAPS capsule TAKE 1 CAPSULE BY MOUTH ONCE DAILY BEFORE BREAKFAST 90 capsule 0   metFORMIN (GLUCOPHAGE) 1000 MG tablet Take 1 tablet (1,000 mg total) by mouth 2 (two) times daily with a meal. 90 tablet 3   Misc. Devices MISC Blood pressure monitoring device 1 each 0   montelukast (SINGULAIR) 10 MG tablet TAKE 1 TABLET BY MOUTH AT BEDTIME 90 tablet 0    ondansetron (ZOFRAN-ODT) 4 MG disintegrating tablet Take 4 mg by mouth every 6 (six) hours as needed for nausea or vomiting.     potassium chloride (KLOR-CON) 10 MEQ tablet TAKE 1 TABLET BY MOUTH ONCE DAILY IN THE EVENING 90 tablet 0   promethazine-dextromethorphan (PROMETHAZINE-DM) 6.25-15 MG/5ML syrup Take 5 mLs by mouth 4 (four) times daily as needed for cough. 118 mL 0   rizatriptan (MAXALT) 10 MG tablet Take 10 mg by mouth daily as needed for migraine.     Sod Picosulfate-Mag Ox-Cit Acd (CLENPIQ) 10-3.5-12 MG-GM -GM/175ML SOLN Take 1 kit by mouth as directed. 350 mL 0   spironolactone (ALDACTONE) 50 MG tablet Take 1 tablet (50 mg total) by mouth daily. 90 tablet 1   topiramate (TOPAMAX) 25 MG tablet Take 50 mg by mouth at bedtime.     traMADol (ULTRAM) 50 MG tablet Take 1 tablet (50 mg total) by mouth every 12 (twelve) hours as needed for severe pain. 30 tablet 0   Vitamin D, Ergocalciferol, (DRISDOL) 1.25 MG (50000 UNIT) CAPS capsule Take 1 capsule by mouth once a week 8 capsule 0   labetalol (NORMODYNE) 100 MG tablet Take 1 tablet (100 mg total) by mouth 2 (two) times daily. 180 tablet 1   No facility-administered medications prior to visit.    Allergies  Allergen Reactions   Lisinopril Swelling    Swelling in face    Review of Systems  Constitutional:  Negative for chills and fever.  HENT:  Negative for congestion, sinus pressure, sinus pain and sore throat.   Eyes:  Negative for pain and discharge.  Respiratory:  Negative for cough and shortness of breath.   Cardiovascular:  Negative for chest pain and palpitations.  Gastrointestinal:  Negative for abdominal pain, diarrhea, nausea and vomiting.  Endocrine: Negative for polydipsia and polyuria.       Hot flashes  Genitourinary:  Negative for dysuria and hematuria.  Musculoskeletal:  Positive for back pain. Negative for neck pain and neck stiffness.  Skin:  Negative for rash.       Cyst on face  Neurological:  Negative for  dizziness and weakness.  Psychiatric/Behavioral:  Negative for agitation and behavioral problems.        Objective:    Physical Exam Vitals reviewed.  Constitutional:      General: She is not in acute distress.    Appearance: She is obese. She is not diaphoretic.  HENT:     Head: Normocephalic and atraumatic.     Nose: Nose normal. No congestion.     Mouth/Throat:     Mouth: Mucous membranes are moist.     Pharynx: No posterior oropharyngeal erythema.  Eyes:     General: No scleral icterus.    Extraocular Movements: Extraocular movements intact.  Cardiovascular:     Rate and Rhythm: Normal rate and regular rhythm.     Pulses: Normal pulses.     Heart sounds:  Murmur (Systolic over upper sternal border) heard.  Pulmonary:     Breath sounds: No wheezing or rales.  Musculoskeletal:     Cervical back: Neck supple. No tenderness.     Right lower leg: No edema.     Left lower leg: No edema.  Skin:    General: Skin is warm.     Comments: Epidermal cyst on right side of face, near hairline, about 1 cm in diameter Papular lesions over thigh and right shoulder area, ruptured acneiform lesions  Neurological:     General: No focal deficit present.     Mental Status: She is alert and oriented to person, place, and time.     Sensory: No sensory deficit.     Motor: No weakness.  Psychiatric:        Mood and Affect: Mood normal.        Behavior: Behavior normal.     BP (!) 88/67 (BP Location: Left Arm, Patient Position: Sitting, Cuff Size: Large)   Pulse 80   Ht '5\' 5"'$  (1.651 m)   Wt 224 lb 6.4 oz (101.8 kg)   LMP 09/20/2015 (Exact Date)   SpO2 96%   BMI 37.34 kg/m  Wt Readings from Last 3 Encounters:  09/21/22 224 lb 6.4 oz (101.8 kg)  07/11/22 235 lb 3.2 oz (106.7 kg)  06/06/22 221 lb 6.4 oz (100.4 kg)        Assessment & Plan:   Problem List Items Addressed This Visit       Cardiovascular and Mediastinum   Essential hypertension, benign    BP Readings from Last 1  Encounters:  09/21/22 (!) 88/67  Usually well-controlled with Amlodipine 10 mg QD, Spironolactone 50 mg QD and Labetalol 100 mg BID But has low normal BP today - decreased dose of labetalol to 50 mg twice daily Counseled for compliance with the medications Advised DASH diet and moderate exercise/walking, at least 150 mins/week      Relevant Medications   labetalol (NORMODYNE) 100 MG tablet     Musculoskeletal and Integument   Infected cyst of skin - Primary    Lesion on the right side of face likely infected cyst Started Keflex Referred to dermatology as she has had recurrent facial lesions      Relevant Medications   cephALEXin (KEFLEX) 500 MG capsule   Other Relevant Orders   Ambulatory referral to Dermatology     Other   Tobacco abuse    Smokes about 2-3 cigarettes per day  Asked about quitting: confirms that he/she currently smokes cigarettes Advise to quit smoking: Educated about QUITTING to reduce the risk of cancer, cardio and cerebrovascular disease. Assess willingness: Unwilling to quit at this time, but is working on cutting back. Assist with counseling and pharmacotherapy: Counseled for 5 minutes and literature provided. Arrange for follow up: follow up in 3 months and continue to offer help.      Morbid obesity ([place])    BMI Readings from Last 3 Encounters:  09/21/22 37.34 kg/m  07/11/22 39.14 kg/m  06/06/22 38.00 kg/m  Associated with HTN, type II DM and HLD Has lost >100 lbs with diet modification in the past Referred to bariatric surgery        Meds ordered this encounter  Medications   cephALEXin (KEFLEX) 500 MG capsule    Sig: Take 1 capsule (500 mg total) by mouth 3 (three) times daily.    Dispense:  15 capsule    Refill:  0   labetalol (  NORMODYNE) 100 MG tablet    Sig: Take 0.5 tablets (50 mg total) by mouth 2 (two) times daily.    Dispense:  90 tablet    Refill:  1     Jehieli Brassell Keith Rake, MD

## 2022-09-21 NOTE — Assessment & Plan Note (Addendum)
BMI Readings from Last 3 Encounters:  09/21/22 37.34 kg/m  07/11/22 39.14 kg/m  06/06/22 38.00 kg/m   Associated with HTN, type II DM and HLD Has lost >100 lbs with diet modification in the past Referred to bariatric surgery

## 2022-10-03 ENCOUNTER — Ambulatory Visit (INDEPENDENT_AMBULATORY_CARE_PROVIDER_SITE_OTHER): Payer: Medicaid Other | Admitting: Orthopaedic Surgery

## 2022-10-03 ENCOUNTER — Encounter: Payer: Self-pay | Admitting: Orthopaedic Surgery

## 2022-10-03 DIAGNOSIS — M25562 Pain in left knee: Secondary | ICD-10-CM | POA: Diagnosis not present

## 2022-10-03 DIAGNOSIS — G8929 Other chronic pain: Secondary | ICD-10-CM

## 2022-10-03 DIAGNOSIS — M25561 Pain in right knee: Secondary | ICD-10-CM | POA: Diagnosis not present

## 2022-10-03 MED ORDER — METHYLPREDNISOLONE ACETATE 40 MG/ML IJ SUSP
40.0000 mg | Freq: Once | INTRAMUSCULAR | Status: AC
  Administered 2022-10-03: 40 mg via INTRA_ARTICULAR

## 2022-10-03 NOTE — Progress Notes (Signed)
PROCEDURE NOTE:  The patient requests injections of the left knee , verbal consent was obtained.  The left knee was prepped appropriately after time out was performed.   Sterile technique was observed and injection of 1 cc of DepoMedrol 40 mg with several cc's of plain xylocaine. Anesthesia was provided by ethyl chloride and a 20-gauge needle was used to inject the knee area. The injection was tolerated well.  A band aid dressing was applied.  The patient was advised to apply ice later today and tomorrow to the injection sight as needed.  PROCEDURE NOTE:  The patient requests injections of the right knee , verbal consent was obtained.  The right knee was prepped appropriately after time out was performed.   Sterile technique was observed and injection of 1 cc of DepoMedrol '40mg'$  with several cc's of plain xylocaine. Anesthesia was provided by ethyl chloride and a 20-gauge needle was used to inject the knee area. The injection was tolerated well.  A band aid dressing was applied.  The patient was advised to apply ice later today and tomorrow to the injection sight as needed.  Encounter Diagnosis  Name Primary?   Chronic pain of both knees Yes   Return in six weeks.  Call if any problem.  Precautions discussed.  Electronically Signed Sanjuana Kava, MD 1/23/20248:33 AM

## 2022-10-03 NOTE — Addendum Note (Signed)
Addended by: Obie Dredge A on: 10/03/2022 10:32 AM   Modules accepted: Orders

## 2022-10-04 ENCOUNTER — Telehealth: Payer: Self-pay | Admitting: Internal Medicine

## 2022-10-04 ENCOUNTER — Other Ambulatory Visit: Payer: Self-pay | Admitting: Internal Medicine

## 2022-10-04 DIAGNOSIS — L089 Local infection of the skin and subcutaneous tissue, unspecified: Secondary | ICD-10-CM

## 2022-10-04 MED ORDER — CEPHALEXIN 500 MG PO CAPS: 500.0000 mg | ORAL_CAPSULE | Freq: Three times a day (TID) | ORAL | 0 refills | Status: DC

## 2022-10-04 NOTE — Telephone Encounter (Signed)
Patient called need antibiotic refill cephALEXin (KEFLEX) 500 MG capsule [[id number]  Pharmacy  [place], [place] - [address] [place], [place] [address] Phone: [phone]  Fax: [phone]

## 2022-10-09 ENCOUNTER — Ambulatory Visit (INDEPENDENT_AMBULATORY_CARE_PROVIDER_SITE_OTHER): Payer: Medicaid Other | Admitting: Internal Medicine

## 2022-10-09 ENCOUNTER — Encounter: Payer: Self-pay | Admitting: Internal Medicine

## 2022-10-09 VITALS — BP 152/84 | HR 70 | Ht 65.0 in | Wt 229.2 lb

## 2022-10-09 DIAGNOSIS — E782 Mixed hyperlipidemia: Secondary | ICD-10-CM | POA: Diagnosis not present

## 2022-10-09 DIAGNOSIS — J449 Chronic obstructive pulmonary disease, unspecified: Secondary | ICD-10-CM

## 2022-10-09 DIAGNOSIS — I10 Essential (primary) hypertension: Secondary | ICD-10-CM | POA: Diagnosis not present

## 2022-10-09 DIAGNOSIS — Z794 Long term (current) use of insulin: Secondary | ICD-10-CM

## 2022-10-09 DIAGNOSIS — Z1330 Encounter for screening examination for mental health and behavioral disorders, unspecified: Secondary | ICD-10-CM | POA: Diagnosis not present

## 2022-10-09 DIAGNOSIS — I2782 Chronic pulmonary embolism: Secondary | ICD-10-CM | POA: Diagnosis not present

## 2022-10-09 DIAGNOSIS — E1165 Type 2 diabetes mellitus with hyperglycemia: Secondary | ICD-10-CM

## 2022-10-09 DIAGNOSIS — G8929 Other chronic pain: Secondary | ICD-10-CM | POA: Diagnosis not present

## 2022-10-09 DIAGNOSIS — M545 Low back pain, unspecified: Secondary | ICD-10-CM | POA: Diagnosis not present

## 2022-10-09 DIAGNOSIS — M47816 Spondylosis without myelopathy or radiculopathy, lumbar region: Secondary | ICD-10-CM | POA: Diagnosis not present

## 2022-10-09 LAB — POCT GLYCOSYLATED HEMOGLOBIN (HGB A1C): HbA1c, POC (controlled diabetic range): 9.5 % — AB (ref 0.0–7.0)

## 2022-10-09 MED ORDER — ATORVASTATIN CALCIUM 80 MG PO TABS: 80.0000 mg | ORAL_TABLET | Freq: Every day | ORAL | 3 refills | Status: DC

## 2022-10-09 MED ORDER — LABETALOL HCL 100 MG PO TABS: ORAL_TABLET | ORAL | 1 refills | Status: DC

## 2022-10-09 MED ORDER — DEXCOM G7 SENSOR MISC: 1.0000 | 3 refills | Status: DC

## 2022-10-09 NOTE — Assessment & Plan Note (Addendum)
Lab Results  Component Value Date   HGBA1C 9.5 (A) 10/09/2022   Uncontrolled On Metformin, Jardiance and Levemir 34 U QD, followed by endocrinology Increased Levemir to 40 units daily Advised to follow diabetic diet On statin and ACEi Diabetic eye exam: Advised to follow up with Ophthalmology for diabetic eye exam

## 2022-10-09 NOTE — Patient Instructions (Signed)
Please start taking Labetalol 100 mg in the morning and 50 mg in the evening.  Please continue taking Amlodipine and Spironolactone for blood pressure.  Please continue to follow low carb diet and perform moderate exercise/walking at least 150 mins/week.

## 2022-10-09 NOTE — Assessment & Plan Note (Signed)
BMI Readings from Last 3 Encounters:  10/09/22 38.14 kg/m  09/21/22 37.34 kg/m  07/11/22 39.14 kg/m   Associated with HTN, type II DM and HLD Has lost >100 lbs with diet modification in the past Referred to bariatric surgery

## 2022-10-09 NOTE — Assessment & Plan Note (Signed)
BP Readings from Last 1 Encounters:  10/09/22 (!) 152/84   Uncontrolled with Amlodipine 10 mg QD, Spironolactone 50 mg QD and Labetalol 50 mg BID But had low normal BP - decreased dose of labetalol to 50 mg twice daily, but increased today to 100 mg QAM and 50 mg QPM Counseled for compliance with the medications Advised DASH diet and moderate exercise/walking, at least 150 mins/week

## 2022-10-09 NOTE — Assessment & Plan Note (Signed)
Well-controlled with Albuterol inhaler PRN Recently has had dyspnea/wheezing in the setting of URTI

## 2022-10-09 NOTE — Assessment & Plan Note (Signed)
Lipid profile reviewed Last cardiology note reviewed, referred to lipid clinic Currently on statin, plan to start PCSK9 inhibitor therapy

## 2022-10-09 NOTE — Progress Notes (Signed)
Established Patient Office Visit  Subjective:  Patient ID: Sonya James, female    DOB: 12-22-1965  Age: 57 y.o. MRN: [id number]  CC:  Chief Complaint  Patient presents with   Hypertension    Four month follow up for hypertension and diabetes    HPI Sonya James is a 57 y.o. female with past medical history of HTN, type II DM, HLD, COPD, OSA, PE, GERD, obesity and tobacco abuse who presents for f/u of her chronic medical conditions.  HTN: Her BP is elevated today.  Her lisinopril was recently discontinued due to angioedema. She still takes amlodipine, spironolactone and labetalol.  Her dose of labetalol was decreased to 50 mg twice daily due to hypotension related dizziness.  She denies any headache, chest pain or palpitations.  Type II DM with HLD: Her HbA1c is increased to 9.5 now.  She is on Levemir 34 units, according to her endocrinology office visit note.  She had CGM, but has not refilled it.  She also takes metformin and Jardiance.  She takes Lipitor for HLD.  She complains of dyspnea and wheezing intermittently, and has been using albuterol inhaler for her COPD.  Denies any hemoptysis.  Past Medical History:  Diagnosis Date   Anemia    Anxiety    Arthritis    Asthma 05/25/2013   Autonomic neuropathy    Cancer of endometrium ([place]) 10/18/2015   Cellulitis, face 05/03/2021   Constipation    COPD (chronic obstructive pulmonary disease) (HCC)    CTS (carpal tunnel syndrome)    Depression    Diabetes mellitus without complication ([place])    Dysphagia 12/14/2016   Encounter for screening colonoscopy    Endometrial cancer ([place]) 2017   Gastritis without bleeding    GERD (gastroesophageal reflux disease)    Gross hematuria 05/11/2019   Headache(784.0)    Heavy menses 10/11/2015   HTN (hypertension)    Hypercholesterolemia    Hypothyroidism    IBS (irritable bowel syndrome)    Osteoarthritis    osteoarthritis   Pancreatitis    per patient    Recurrent boils     buttocks and low back.   S/P laparoscopic hysterectomy 11/04/2015   Shortness of breath    Sleep apnea    CPAP machine   Uncontrolled type 2 diabetes mellitus with diabetic polyneuropathy, with long-term current use of insulin 08/04/2017   Uterine cancer ([place]) 2017   Vitamin D deficiency     Past Surgical History:  Procedure Laterality Date   ABDOMINAL HYSTERECTOMY  2018   endometrial cancer, surgery done at [place]  01/02/2017   Procedure: BIOPSY;  Surgeon: Danie Binder, MD;  Location: [facility];  Service: Endoscopy;;  gastric biopsy   CESAREAN SECTION  1989   COLONOSCOPY WITH PROPOFOL N/A 01/02/2017   Dr. Oneida Alar: redundant colon, two 2-3 mm polyps in sigmoid colon (hyperplastic)   EAR CYST EXCISION Right 11/29/2021   Procedure: EXCISION OF EXTERNAL EAR LESION;  Surgeon: Jason Coop, DO;  Location: [facility];  Service: ENT;  Laterality: Right;   ESOPHAGOGASTRODUODENOSCOPY  05/22/2011   Dr. Oneida Alar: H.pylori gastritis    ESOPHAGOGASTRODUODENOSCOPY (EGD) WITH PROPOFOL N/A 01/02/2017   Dr. 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 s/p dilation, moderate gastritis (negative H.pylori)   FOOT SURGERY Left    tendon repair   IR US GUIDE VASC ACCESS LEFT  01/18/2021   OOPHORECTOMY     OTOPLASATY Left 06/15/2021   Procedure: Excision of  left auricular lesion with endaural meatoplasty;  Surgeon: Jason Coop, DO;  Location: [facility];  Service: ENT;  Laterality: Left;   POLYPECTOMY  01/02/2017   Procedure: POLYPECTOMY;  Surgeon: Danie Binder, MD;  [facility];  Service: Endoscopy;;  sigmoid colon polyps times 2   REDUCTION MAMMAPLASTY  1996   SAVORY DILATION N/A 01/02/2017   Procedure: SAVORY DILATION;  Surgeon: Danie Binder, MD;  [facility];  Service: Endoscopy;  Laterality: N/A;   TUBAL LIGATION      Family History  Problem Relation Age of Onset   Diabetes Mother    Hypertension Mother    COPD Mother    Asthma Mother     Hypercholesterolemia Mother    Hypertension Sister    Hyperlipidemia Sister    Diabetes Sister    Asthma Brother    Alcohol abuse Brother    Asthma Son    Cancer Maternal Grandmother        lung, throat, breast   Alzheimer's disease Maternal Grandmother    Hypertension Maternal Grandmother    Hypercholesterolemia Maternal Grandmother    Heart disease Maternal Grandfather    Stroke Maternal Grandfather    Clotting disorder Maternal Grandfather        blood clots in legs   Hypertension Sister    Hyperlipidemia Sister    Stroke Maternal Aunt    Clotting disorder Maternal Aunt        blood clots in legs   Hypertension Maternal Aunt    Hypercholesterolemia Maternal Aunt    Hypertension Maternal Aunt    Asthma Maternal Aunt    Clotting disorder Maternal Uncle        blood clot -legs traveled to heart and he died of heart attack   Colon cancer Neg Hx     Social History   Socioeconomic History   Marital status: Married    Spouse name: Not on file   Number of children: 1   Years of education: Not on file   Highest education level: Not on file  Occupational History   Not on file  Tobacco Use   Smoking status: Every Day    Packs/day: 1.50    Years: 22.00    Total pack years: 33.00    Types: Cigarettes   Smokeless tobacco: Never  Vaping Use   Vaping Use: Never used  Substance and Sexual Activity   Alcohol use: No   Drug use: No   Sexual activity: Not Currently    Birth control/protection: Surgical    Comment: hysterectomy  Other Topics Concern   Not on file  Social History Narrative   Lives with husband and she has been married to for 9 years.  Recently got evicted and has been living in a motel since last July.   She has 1 son that lives in [place].  Reports one grandbaby      Enjoys watching TV mostly animal [place].      Diet: Eats all food groups   Caffeine: Soda not diet, green tea, coffee about 3 cups of caffeine per day.   Water: Drinks 5-6 8 ounce  bottles throughout the day.      Wears a seatbelt.  Currently does not have a vehicle.  Smoke detectors in the facility she is living in.  Does not have any weapons.   Social Determinants of Health   Financial Resource Strain: High Risk (04/06/2022)   Overall Financial Resource Strain (CARDIA)  Difficulty of Paying Living Expenses: Very hard  Food Insecurity: Food Insecurity Present (04/06/2022)   Hunger Vital Sign    Worried About Running Out of Food in the Last Year: Often true    Ran Out of Food in the Last Year: Often true  Transportation Needs: No Transportation Needs (04/06/2022)   PRAPARE - Hydrologist (Medical): No    Lack of Transportation (Non-Medical): No  Physical Activity: Insufficiently Active (04/06/2022)   Exercise Vital Sign    Days of Exercise per Week: 1 day    Minutes of Exercise per Session: 30 min  Stress: Stress Concern Present (04/06/2022)   [place]    Feeling of Stress : Rather much  Social Connections: Moderately Isolated (04/06/2022)   Social Connection and Isolation Panel [NHANES]    Frequency of Communication with Friends and Family: Once a week    Frequency of Social Gatherings with Friends and Family: Never    Attends Religious Services: 1 to 4 times per year    Active Member of [employer] or Organizations: No    Attends Archivist Meetings: Never    Marital Status: Married  Human resources officer Violence: Not At Risk (04/06/2022)   Humiliation, Afraid, Rape, and Kick questionnaire    Fear of Current or Ex-Partner: No    Emotionally Abused: No    Physically Abused: No    Sexually Abused: No    Outpatient Medications Prior to Visit  Medication Sig Dispense Refill   Accu-Chek Softclix Lancets lancets To test glucose 4 times a day 150 each 2   albuterol (VENTOLIN HFA) 108 (90 Base) MCG/ACT inhaler Inhale 2 puffs into the lungs every 6 (six) hours as  needed for wheezing or shortness of breath. 18 g 5   amLODipine (NORVASC) 10 MG tablet Take 1 tablet (10 mg total) by mouth daily. 90 tablet 1   apixaban (ELIQUIS) 5 MG TABS tablet Take 1 tablet (5 mg total) by mouth 2 (two) times daily. On 08/12/17 @ 9PM, start 1 tab (5 mg) two times daily. 60 tablet 5   Blood Glucose Monitoring Suppl (ACCU-CHEK GUIDE ME) w/Device KIT 1 Piece by Does not apply route as directed. 1 kit 0   cephALEXin (KEFLEX) 500 MG capsule Take 1 capsule (500 mg total) by mouth 3 (three) times daily. 15 capsule 0   clotrimazole (GYNE-LOTRIMIN 3) 2 % vaginal cream Place 1 Applicatorful vaginally at bedtime. 21 g 0   cycloSPORINE (RESTASIS) 0.05 % ophthalmic emulsion Place 1 drop into both eyes 2 (two) times daily.     DULoxetine (CYMBALTA) 60 MG capsule Take 1 capsule (60 mg total) by mouth daily. 90 capsule 1   empagliflozin (JARDIANCE) 25 MG TABS tablet Take 1 tablet (25 mg total) by mouth daily before breakfast. 90 tablet 3   esomeprazole (NEXIUM) 40 MG capsule TAKE 1 CAPSULE BY MOUTH TWICE DAILY BEFORE MEAL(S) 180 capsule 3   furosemide (LASIX) 40 MG tablet Take 1 tablet by mouth once daily 90 tablet 0   glucose blood (ACCU-CHEK GUIDE) test strip Test glucose 4 times a day 150 each 2   insulin glargine (LANTUS SOLOSTAR) 100 UNIT/ML Solostar Pen Inject 34 Units into the skin daily. 30 mL 6   Insulin Pen Needle 31G X 5 MM MISC 1 Device by Does not apply route daily in the afternoon. 100 each 2   ketoconazole (NIZORAL) 2 % cream Apply 1 Application topically daily. [place]  g 0   LINZESS 290 MCG CAPS capsule TAKE 1 CAPSULE BY MOUTH ONCE DAILY BEFORE BREAKFAST 90 capsule 0   metFORMIN (GLUCOPHAGE) 1000 MG tablet Take 1 tablet (1,000 mg total) by mouth 2 (two) times daily with a meal. 90 tablet 3   Misc. Devices MISC Blood pressure monitoring device 1 each 0   montelukast (SINGULAIR) 10 MG tablet TAKE 1 TABLET BY MOUTH AT BEDTIME 90 tablet 0   ondansetron (ZOFRAN-ODT) 4 MG disintegrating  tablet Take 4 mg by mouth every 6 (six) hours as needed for nausea or vomiting.     potassium chloride (KLOR-CON) 10 MEQ tablet TAKE 1 TABLET BY MOUTH ONCE DAILY IN THE EVENING 90 tablet 0   promethazine-dextromethorphan (PROMETHAZINE-DM) 6.25-15 MG/5ML syrup Take 5 mLs by mouth 4 (four) times daily as needed for cough. 118 mL 0   rizatriptan (MAXALT) 10 MG tablet Take 10 mg by mouth daily as needed for migraine.     Sod Picosulfate-Mag Ox-Cit Acd (CLENPIQ) 10-3.5-12 MG-GM -GM/175ML SOLN Take 1 kit by mouth as directed. 350 mL 0   spironolactone (ALDACTONE) 50 MG tablet Take 1 tablet (50 mg total) by mouth daily. 90 tablet 1   traMADol (ULTRAM) 50 MG tablet Take 1 tablet (50 mg total) by mouth every 12 (twelve) hours as needed for severe pain. 30 tablet 0   Vitamin D, Ergocalciferol, (DRISDOL) 1.25 MG (50000 UNIT) CAPS capsule Take 1 capsule by mouth once a week 8 capsule 0   atorvastatin (LIPITOR) 80 MG tablet Take 1 tablet (80 mg total) by mouth daily. 90 tablet 3   Continuous Blood Gluc Sensor (DEXCOM G7 SENSOR) MISC 1 Device by Does not apply route as directed. 9 each 3   labetalol (NORMODYNE) 100 MG tablet Take 0.5 tablets (50 mg total) by mouth 2 (two) times daily. 90 tablet 1   topiramate (TOPAMAX) 25 MG tablet Take 50 mg by mouth at bedtime.     No facility-administered medications prior to visit.    Allergies  Allergen Reactions   Lisinopril Swelling    Swelling in face    ROS Review of Systems  Constitutional:  Negative for chills and fever.  HENT:  Positive for congestion, sinus pressure and sore throat. Negative for sinus pain.   Eyes:  Negative for pain and discharge.  Respiratory:  Positive for cough, shortness of breath and wheezing.   Cardiovascular:  Negative for chest pain and palpitations.  Gastrointestinal:  Negative for abdominal pain, diarrhea, nausea and vomiting.  Endocrine: Negative for polydipsia and polyuria.       Hot flashes  Genitourinary:  Negative for  dysuria and hematuria.  Musculoskeletal:  Positive for back pain. Negative for neck pain and neck stiffness.  Skin:  Negative for rash.  Neurological:  Negative for dizziness and weakness.  Psychiatric/Behavioral:  Negative for agitation and behavioral problems.       Objective:    Physical Exam Vitals reviewed.  Constitutional:      General: She is not in acute distress.    Appearance: She is obese. She is not diaphoretic.  HENT:     Head: Normocephalic and atraumatic.     Nose: Nose normal. No congestion.     Mouth/Throat:     Mouth: Mucous membranes are moist.     Pharynx: No posterior oropharyngeal erythema.  Eyes:     General: No scleral icterus.    Extraocular Movements: Extraocular movements intact.  Neck:     Vascular: No carotid bruit.  Cardiovascular:     Rate and Rhythm: Normal rate and regular rhythm.     Pulses: Normal pulses.     Heart sounds: Murmur (Systolic over upper sternal border) heard.  Pulmonary:     Breath sounds: Wheezing (In upper lung fields) present. No rales.  Abdominal:     Palpations: Abdomen is soft.     Tenderness: There is no abdominal tenderness.  Musculoskeletal:     Cervical back: Neck supple. No tenderness.     Right lower leg: No edema.     Left lower leg: No edema.  Skin:    General: Skin is warm.     Findings: Lesion (Hyperkeratotic lesion, brownish, about 2 cm in diameter - likely seborrheic keratosis) present.     Comments: Papular lesions over thigh and right shoulder area, ruptured acneiform lesions  Neurological:     General: No focal deficit present.     Mental Status: She is alert and oriented to person, place, and time.     Sensory: No sensory deficit.     Motor: No weakness.  Psychiatric:        Mood and Affect: Mood normal.        Behavior: Behavior normal.     BP (!) 152/84 (BP Location: Left Arm, Cuff Size: Normal)   Pulse 70   Ht '5\' 5"'$  (1.651 m)   Wt 229 lb 3.2 oz (104 kg)   LMP 09/20/2015 (Exact Date)    SpO2 95%   BMI 38.14 kg/m  Wt Readings from Last 3 Encounters:  10/09/22 229 lb 3.2 oz (104 kg)  09/21/22 224 lb 6.4 oz (101.8 kg)  07/11/22 235 lb 3.2 oz (106.7 kg)    Lab Results  Component Value Date   TSH 1.800 03/06/2022   Lab Results  Component Value Date   WBC 6.5 04/02/2022   HGB 13.4 04/02/2022   HCT 42.5 04/02/2022   MCV 95.5 04/02/2022   PLT 351 04/02/2022   Lab Results  Component Value Date   NA 142 06/06/2022   K 4.4 06/06/2022   CO2 25 06/06/2022   GLUCOSE 159 (H) 06/06/2022   BUN 14 06/06/2022   CREATININE 0.91 06/06/2022   BILITOT 0.2 06/06/2022   ALKPHOS 159 (H) 06/06/2022   AST 11 06/06/2022   ALT 10 06/06/2022   PROT 6.7 06/06/2022   ALBUMIN 3.8 06/06/2022   CALCIUM 10.4 (H) 06/06/2022   ANIONGAP 3 (L) 04/02/2022   EGFR 74 06/06/2022   Lab Results  Component Value Date   CHOL 196 03/06/2022   Lab Results  Component Value Date   HDL 33 (L) 03/06/2022   Lab Results  Component Value Date   LDLCALC 141 (H) 03/06/2022   Lab Results  Component Value Date   TRIG 119 03/06/2022   Lab Results  Component Value Date   CHOLHDL 5.9 (H) 03/06/2022   Lab Results  Component Value Date   HGBA1C 9.5 (A) 10/09/2022      Assessment & Plan:   Problem List Items Addressed This Visit    Uncontrolled type 2 diabetes mellitus with hyperglycemia, with long-term current use of insulin ([place]) Lab Results  Component Value Date   HGBA1C 9.5 (A) 10/09/2022   Uncontrolled On Metformin, Jardiance and Levemir 34 U QD, followed by endocrinology Increased Levemir to 40 units daily Advised to follow diabetic diet On statin and ACEi Diabetic eye exam: Advised to follow up with Ophthalmology for diabetic eye exam  Essential hypertension, benign BP Readings from  Last 1 Encounters:  10/09/22 (!) 152/84   Uncontrolled with Amlodipine 10 mg QD, Spironolactone 50 mg QD and Labetalol 50 mg BID But had low normal BP - decreased dose of labetalol to 50 mg  twice daily, but increased today to 100 mg QAM and 50 mg QPM Counseled for compliance with the medications Advised DASH diet and moderate exercise/walking, at least 150 mins/week  Pulmonary embolism (HCC) Unprovoked On Eliquis 5 mg BID Followed by heme-onc.  Chronic obstructive pulmonary disease (HCC) Well-controlled with Albuterol inhaler PRN Recently has had dyspnea/wheezing in the setting of URTI  Mixed hyperlipidemia Lipid profile reviewed Last cardiology note reviewed, referred to lipid clinic Currently on statin, plan to start PCSK9 inhibitor therapy  Morbid obesity ([place]) BMI Readings from Last 3 Encounters:  10/09/22 38.14 kg/m  09/21/22 37.34 kg/m  07/11/22 39.14 kg/m   Associated with HTN, type II DM and HLD Has lost >100 lbs with diet modification in the past Referred to bariatric surgery    Meds ordered this encounter  Medications   atorvastatin (LIPITOR) 80 MG tablet    Sig: Take 1 tablet (80 mg total) by mouth daily.    Dispense:  90 tablet    Refill:  3   Continuous Blood Gluc Sensor (DEXCOM G7 SENSOR) MISC    Sig: 1 Device by Does not apply route as directed.    Dispense:  9 each    Refill:  3   labetalol (NORMODYNE) 100 MG tablet    Sig: Take 1 tablet (100 mg total) by mouth in the morning AND 0.5 tablets (50 mg total) every evening.    Dispense:  135 tablet    Refill:  1    Follow-up: Return in about 3 months (around 01/08/2023) for HTN and DM.    Lindell Spar, MD

## 2022-10-09 NOTE — Assessment & Plan Note (Signed)
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

## 2022-10-24 ENCOUNTER — Ambulatory Visit (INDEPENDENT_AMBULATORY_CARE_PROVIDER_SITE_OTHER): Payer: Medicaid Other | Admitting: Internal Medicine

## 2022-10-24 ENCOUNTER — Encounter: Payer: Self-pay | Admitting: Internal Medicine

## 2022-10-24 ENCOUNTER — Ambulatory Visit: Payer: Medicaid Other | Admitting: Gastroenterology

## 2022-10-24 VITALS — BP 133/76 | HR 70 | Resp 16 | Ht 65.0 in | Wt 224.1 lb

## 2022-10-24 DIAGNOSIS — J441 Chronic obstructive pulmonary disease with (acute) exacerbation: Secondary | ICD-10-CM | POA: Diagnosis not present

## 2022-10-24 MED ORDER — SPIRIVA RESPIMAT 2.5 MCG/ACT IN AERS: 2.0000 | INHALATION_SPRAY | Freq: Every day | RESPIRATORY_TRACT | 1 refills | Status: DC

## 2022-10-24 MED ORDER — PREDNISONE 20 MG PO TABS: 40.0000 mg | ORAL_TABLET | Freq: Every day | ORAL | 0 refills | Status: AC

## 2022-10-24 NOTE — Progress Notes (Signed)
   HPI:Sonya James is a 56 y.o. female living with COPD and tobacco use disorder who presents for evaluation of cough.  For the last 2 weeks patient has increased cough and thick mucus production and also increased dyspnea.  She is using albuterol inhaler multiple times per day.  She was also prescribed cough medicine.  She has tried Mucinex and Robitussin.  Her cough is not improving. She has no fever, chest pain,or malaise.   Physical Exam: Vitals:   10/24/22 1008  BP: 133/76  Pulse: 70  Resp: 16  SpO2: 94%  Weight: 224 lb 1.9 oz (101.7 kg)  Height: '5\' 5"'$  (1.651 m)   Vitals reviewed, noted SpO2 94%  Physical Exam Constitutional:      General: She is not in acute distress.    Appearance: She is well-groomed.  Cardiovascular:     Rate and Rhythm: Normal rate and regular rhythm.     Heart sounds: No murmur heard. Pulmonary:     Effort: Pulmonary effort is normal.     Breath sounds: Wheezing and rhonchi present. No rales.  Musculoskeletal:     Right lower leg: No edema.     Left lower leg: No edema.      Assessment & Plan:   COPD with exacerbation ([place]) - Counseled on tobacco use, not ready to quit today. - Start this medication: Tiotropium Bromide Monohydrate (SPIRIVA RESPIMAT) 2.5 MCG/ACT AERS; Inhale 2 puffs into the lungs daily.  Dispense: 12 g; Refill: 1 - Take this medication for 5 days: predniSONE (DELTASONE) 20 MG tablet; Take 2 tablets (40 mg total) by mouth daily for 5 days.  Dispense: 10 tablet; Refill: 0 - Continue using albuterol as needed for shortness of breath - Follow up if symptoms worsen or fail to improve    Lorene Dy, MD

## 2022-10-24 NOTE — Assessment & Plan Note (Signed)
-   Counseled on tobacco use, not ready to quit today. - Start this medication: Tiotropium Bromide Monohydrate (SPIRIVA RESPIMAT) 2.5 MCG/ACT AERS; Inhale 2 puffs into the lungs daily.  Dispense: 12 g; Refill: 1 - Take this medication for 5 days: predniSONE (DELTASONE) 20 MG tablet; Take 2 tablets (40 mg total) by mouth daily for 5 days.  Dispense: 10 tablet; Refill: 0 - Continue using albuterol as needed for shortness of breath - Follow up if symptoms worsen or fail to improve

## 2022-10-24 NOTE — Patient Instructions (Signed)
Thank you for trusting me with your care. To recap, today we discussed the following:   COPD with exacerbation   - Start this medication: Tiotropium Bromide Monohydrate (SPIRIVA RESPIMAT) 2.5 MCG/ACT AERS; Inhale 2 puffs into the lungs daily.  Dispense: 12 g; Refill: 1 - Take this medication for 5 days: predniSONE (DELTASONE) 20 MG tablet; Take 2 tablets (40 mg total) by mouth daily for 5 days.  Dispense: 10 tablet; Refill: 0 - Continue using albuterol as needed for shortness of breath - Follow up if symptoms worsen or fail to improve

## 2022-10-26 DIAGNOSIS — Z86711 Personal history of pulmonary embolism: Secondary | ICD-10-CM | POA: Diagnosis not present

## 2022-10-26 DIAGNOSIS — G4730 Sleep apnea, unspecified: Secondary | ICD-10-CM | POA: Diagnosis not present

## 2022-10-26 DIAGNOSIS — E669 Obesity, unspecified: Secondary | ICD-10-CM | POA: Diagnosis not present

## 2022-10-26 DIAGNOSIS — E119 Type 2 diabetes mellitus without complications: Secondary | ICD-10-CM | POA: Diagnosis not present

## 2022-10-26 DIAGNOSIS — J449 Chronic obstructive pulmonary disease, unspecified: Secondary | ICD-10-CM | POA: Diagnosis not present

## 2022-10-26 DIAGNOSIS — I10 Essential (primary) hypertension: Secondary | ICD-10-CM | POA: Diagnosis not present

## 2022-10-26 DIAGNOSIS — Z794 Long term (current) use of insulin: Secondary | ICD-10-CM | POA: Diagnosis not present

## 2022-10-26 DIAGNOSIS — J45909 Unspecified asthma, uncomplicated: Secondary | ICD-10-CM | POA: Diagnosis not present

## 2022-10-26 DIAGNOSIS — E785 Hyperlipidemia, unspecified: Secondary | ICD-10-CM | POA: Diagnosis not present

## 2022-10-26 DIAGNOSIS — K219 Gastro-esophageal reflux disease without esophagitis: Secondary | ICD-10-CM | POA: Diagnosis not present

## 2022-10-26 DIAGNOSIS — Z79899 Other long term (current) drug therapy: Secondary | ICD-10-CM | POA: Diagnosis not present

## 2022-11-02 DIAGNOSIS — Z01818 Encounter for other preprocedural examination: Secondary | ICD-10-CM | POA: Diagnosis not present

## 2022-11-02 DIAGNOSIS — E785 Hyperlipidemia, unspecified: Secondary | ICD-10-CM | POA: Diagnosis not present

## 2022-11-02 DIAGNOSIS — E669 Obesity, unspecified: Secondary | ICD-10-CM | POA: Diagnosis not present

## 2022-11-02 DIAGNOSIS — I10 Essential (primary) hypertension: Secondary | ICD-10-CM | POA: Diagnosis not present

## 2022-11-02 DIAGNOSIS — R0600 Dyspnea, unspecified: Secondary | ICD-10-CM | POA: Diagnosis not present

## 2022-11-02 DIAGNOSIS — E1169 Type 2 diabetes mellitus with other specified complication: Secondary | ICD-10-CM | POA: Diagnosis not present

## 2022-11-02 DIAGNOSIS — Z794 Long term (current) use of insulin: Secondary | ICD-10-CM | POA: Diagnosis not present

## 2022-11-02 DIAGNOSIS — Z720 Tobacco use: Secondary | ICD-10-CM | POA: Diagnosis not present

## 2022-11-04 ENCOUNTER — Other Ambulatory Visit: Payer: Self-pay

## 2022-11-04 ENCOUNTER — Emergency Department (HOSPITAL_COMMUNITY)
Admission: EM | Admit: 2022-11-04 | Discharge: 2022-11-04 | Disposition: A | Discharge status: Home / Self Care | Payer: Medicaid Other | Attending: Student | Admitting: Student

## 2022-11-04 ENCOUNTER — Encounter (HOSPITAL_COMMUNITY): Payer: Self-pay

## 2022-11-04 DIAGNOSIS — J45909 Unspecified asthma, uncomplicated: Secondary | ICD-10-CM | POA: Insufficient documentation

## 2022-11-04 DIAGNOSIS — Z7984 Long term (current) use of oral hypoglycemic drugs: Secondary | ICD-10-CM | POA: Diagnosis not present

## 2022-11-04 DIAGNOSIS — J449 Chronic obstructive pulmonary disease, unspecified: Secondary | ICD-10-CM | POA: Diagnosis not present

## 2022-11-04 DIAGNOSIS — E119 Type 2 diabetes mellitus without complications: Secondary | ICD-10-CM | POA: Insufficient documentation

## 2022-11-04 DIAGNOSIS — K029 Dental caries, unspecified: Secondary | ICD-10-CM | POA: Diagnosis not present

## 2022-11-04 DIAGNOSIS — I10 Essential (primary) hypertension: Secondary | ICD-10-CM | POA: Insufficient documentation

## 2022-11-04 DIAGNOSIS — K0889 Other specified disorders of teeth and supporting structures: Secondary | ICD-10-CM | POA: Diagnosis not present

## 2022-11-04 DIAGNOSIS — Z79899 Other long term (current) drug therapy: Secondary | ICD-10-CM | POA: Diagnosis not present

## 2022-11-04 MED ORDER — KETOROLAC TROMETHAMINE 15 MG/ML IJ SOLN
15.0000 mg | Freq: Once | INTRAMUSCULAR | Status: AC
  Administered 2022-11-04: 15 mg via INTRAMUSCULAR
  Filled 2022-11-04: qty 1

## 2022-11-04 MED ORDER — AMOXICILLIN-POT CLAVULANATE 875-125 MG PO TABS
1.0000 | ORAL_TABLET | Freq: Once | ORAL | Status: AC
  Administered 2022-11-04: 1 via ORAL
  Filled 2022-11-04: qty 1

## 2022-11-04 MED ORDER — AMOXICILLIN-POT CLAVULANATE 875-125 MG PO TABS: 1.0000 | ORAL_TABLET | Freq: Two times a day (BID) | ORAL | 0 refills | Status: DC

## 2022-11-04 MED ORDER — ACETAMINOPHEN 500 MG PO TABS: 1000.0000 mg | ORAL_TABLET | Freq: Three times a day (TID) | ORAL | 0 refills | Status: AC

## 2022-11-04 MED ORDER — OXYCODONE HCL 5 MG PO TABS: 5.0000 mg | ORAL_TABLET | ORAL | 0 refills | Status: DC | PRN

## 2022-11-04 NOTE — ED Triage Notes (Signed)
Pt arrived via POV c/o right  side facial pain, sinus pain and reports needing to have two upper right tooth infections and was recently started on antibiotics. Pt reports having a dental appoint arranged to have the teeth pulled, but the swelling and pain is worsening.

## 2022-11-04 NOTE — Discharge Instructions (Signed)
For pain:  - Acetaminophen 1000 mg three times daily (every 8 hours) - oxycodone for breakthrough pain only

## 2022-11-04 NOTE — ED Provider Notes (Signed)
[facility] Provider Note  CSN: [id number] Arrival date & time: 11/04/22 [id number]  Chief Complaint(s) dental infection  HPI Sonya James is a 57 y.o. female with PMH COPD, endometrial cancer, HTN, HLD, OSA on CPAP who presents emergency department for evaluation of dental pain and swelling.  Patient states that she is scheduled to have 2 dental extractions of tooth 5 and 6 on 11/10/2022 but started to have worsening pain at the site over the last 24 hours.  She reportedly called her dentist who called in amoxicillin and she has been taking these medications 4 times a day without improvement.  She states that she woke up this morning with worsening swelling to the upper mandible and, the emergency department for further evaluation due to uncontrolled pain.  She denies muffled voice, dysphagia, visual changes, fever, chest pain, shortness of breath or other systemic symptoms.   Past Medical History Past Medical History:  Diagnosis Date   Anemia    Anxiety    Arthritis    Asthma 05/25/2013   Autonomic neuropathy    Cancer of endometrium ([place]) 10/18/2015   Cellulitis, face 05/03/2021   Constipation    COPD (chronic obstructive pulmonary disease) (HCC)    CTS (carpal tunnel syndrome)    Depression    Diabetes mellitus without complication ([place])    Dysphagia 12/14/2016   Encounter for screening colonoscopy    Endometrial cancer ([place]) 2017   Gastritis without bleeding    GERD (gastroesophageal reflux disease)    Gross hematuria 05/11/2019   Headache(784.0)    Heavy menses 10/11/2015   HTN (hypertension)    Hypercholesterolemia    Hypothyroidism    IBS (irritable bowel syndrome)    Osteoarthritis    osteoarthritis   Pancreatitis    per patient    Recurrent boils    buttocks and low back.   S/P laparoscopic hysterectomy 11/04/2015   Shortness of breath    Sleep apnea    CPAP machine   Uncontrolled type 2 diabetes mellitus with diabetic  polyneuropathy, with long-term current use of insulin 08/04/2017   Uterine cancer ([place]) 2017   Vitamin D deficiency    Patient Active Problem List   Diagnosis Date Noted   COPD with exacerbation ([place]) 10/24/2022   Infected cyst of skin 09/21/2022   Chronic pain syndrome 07/11/2022   Neck mass 07/11/2022   S/P hysterectomy with oophorectomy 04/06/2022   History of endometrial cancer 04/06/2022   Angio-edema 04/05/2022   Encounter for examination following treatment at hospital 04/05/2022   Encounter for general adult medical examination with abnormal findings 03/06/2022   Intertrigo 03/06/2022   Lesion of right external ear 11/29/2021   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   Type 2 diabetes mellitus with diabetic polyneuropathy, with long-term current use of insulin ([place]) [id number]   Systolic murmur [id number]   Hot flashes 06/27/2021   Chondritis of external ear, left 01/13/2021   Personal history of noncompliance with medical treatment, presenting hazards to health 11/16/2020   Long-term current use of opiate analgesic 04/08/2020   Mixed hyperlipidemia 01/20/2020   Idiopathic peripheral neuropathy 12/11/2019   Restless legs 12/11/2019   Vitamin D deficiency 12/11/2019   Chronic low back pain 10/09/2019   Other spondylosis with radiculopathy, lumbar region 10/09/2019   Neck pain 05/22/2019   History of pulmonary embolism    Uncontrolled type 2 diabetes mellitus with hyperglycemia, with long-term current use of insulin ([place]) 08/04/2017   Morbid obesity ([place]) 08/04/2017  Pulmonary embolism ([place]) 08/03/2017   Sleep apnea 08/03/2017   Chronic obstructive pulmonary disease ([place]) 05/25/2013   Tobacco abuse 05/25/2013   Essential hypertension, benign 05/25/2013   Thyromegaly 05/25/2013   Constipation 04/11/2011   GERD (gastroesophageal reflux disease) 04/11/2011   Home Medication(s) Prior to Admission medications   Medication Sig Start Date End Date Taking? Authorizing Provider   acetaminophen (TYLENOL) 500 MG tablet Take 2 tablets (1,000 mg total) by mouth every 8 (eight) hours. 11/04/22 12/04/22 Yes Braxton Weisbecker, MD  amoxicillin-clavulanate (AUGMENTIN) 875-125 MG tablet Take 1 tablet by mouth every 12 (twelve) hours. 11/04/22  Yes Vinay Ertl, MD  oxyCODONE (ROXICODONE) 5 MG immediate release tablet Take 1 tablet (5 mg total) by mouth every 4 (four) hours as needed for breakthrough pain. 11/04/22  Yes Nydia Ytuarte, MD  Accu-Chek Softclix Lancets lancets To test glucose 4 times a day 11/17/20   Cassandria Anger, MD  albuterol (VENTOLIN HFA) 108 (90 Base) MCG/ACT inhaler Inhale 2 puffs into the lungs every 6 (six) hours as needed for wheezing or shortness of breath. 08/21/19   Corum, Rex Kras, MD  amLODipine (NORVASC) 10 MG tablet Take 1 tablet (10 mg total) by mouth daily. 03/06/22   Lindell Spar, MD  apixaban (ELIQUIS) 5 MG TABS tablet Take 1 tablet (5 mg total) by mouth 2 (two) times daily. On 08/12/17 @ 9PM, start 1 tab (5 mg) two times daily. 04/21/22   Lindell Spar, MD  atorvastatin (LIPITOR) 80 MG tablet Take 1 tablet (80 mg total) by mouth daily. 10/09/22   Lindell Spar, MD  Blood Glucose Monitoring Suppl (ACCU-CHEK GUIDE ME) w/Device KIT 1 Piece by Does not apply route as directed. 11/16/20   Cassandria Anger, MD  clotrimazole (GYNE-LOTRIMIN 3) 2 % vaginal cream Place 1 Applicatorful vaginally at bedtime. 05/03/21   Fayrene Helper, MD  Continuous Blood Gluc Sensor (DEXCOM G7 SENSOR) MISC 1 Device by Does not apply route as directed. 10/09/22   Lindell Spar, MD  cycloSPORINE (RESTASIS) 0.05 % ophthalmic emulsion Place 1 drop into both eyes 2 (two) times daily.    [provider]  DULoxetine (CYMBALTA) 60 MG capsule Take 1 capsule (60 mg total) by mouth daily. 07/11/22   Lindell Spar, MD  empagliflozin (JARDIANCE) 25 MG TABS tablet Take 1 tablet (25 mg total) by mouth daily before breakfast. 03/24/22   [name], Melanie Crazier, MD   esomeprazole (NEXIUM) 40 MG capsule TAKE 1 CAPSULE BY MOUTH TWICE DAILY BEFORE MEAL(S) 02/14/22   Sherron Monday, NP  furosemide (LASIX) 40 MG tablet Take 1 tablet by mouth once daily 04/21/22   Lindell Spar, MD  glucose blood (ACCU-CHEK GUIDE) test strip Test glucose 4 times a day 11/17/20   Cassandria Anger, MD  insulin glargine (LANTUS SOLOSTAR) 100 UNIT/ML Solostar Pen Inject 34 Units into the skin daily. 03/24/22   [name], Melanie Crazier, MD  Insulin Pen Needle 31G X 5 MM MISC 1 Device by Does not apply route daily in the afternoon. 09/02/21   [name], Melanie Crazier, MD  ketoconazole (NIZORAL) 2 % cream Apply 1 Application topically daily. 03/06/22   Lindell Spar, MD  labetalol (NORMODYNE) 100 MG tablet Take 1 tablet (100 mg total) by mouth in the morning AND 0.5 tablets (50 mg total) every evening. 10/09/22   Lindell Spar, MD  LINZESS 290 MCG CAPS capsule TAKE 1 CAPSULE BY MOUTH ONCE DAILY BEFORE BREAKFAST 04/21/22   Sherron Monday,  NP  metFORMIN (GLUCOPHAGE) 1000 MG tablet Take 1 tablet (1,000 mg total) by mouth 2 (two) times daily with a meal. 03/24/22   [name], Melanie Crazier, MD  Misc. Devices MISC Blood pressure monitoring device 07/28/21   Lindell Spar, MD  montelukast (SINGULAIR) 10 MG tablet TAKE 1 TABLET BY MOUTH AT BEDTIME 09/14/22   Lindell Spar, MD  ondansetron (ZOFRAN-ODT) 4 MG disintegrating tablet Take 4 mg by mouth every 6 (six) hours as needed for nausea or vomiting. 10/11/15   [provider]  potassium chloride (KLOR-CON) 10 MEQ tablet TAKE 1 TABLET BY MOUTH ONCE DAILY IN THE EVENING 02/09/22   Lindell Spar, MD  promethazine-dextromethorphan (PROMETHAZINE-DM) 6.25-15 MG/5ML syrup Take 5 mLs by mouth 4 (four) times daily as needed for cough. 06/06/22   Lindell Spar, MD  rizatriptan (MAXALT) 10 MG tablet Take 10 mg by mouth daily as needed for migraine. 01/05/21   [provider]  Sod Picosulfate-Mag Ox-Cit Acd (CLENPIQ)  10-3.5-12 MG-GM -GM/175ML SOLN Take 1 kit by mouth as directed. 02/20/22   Eloise Harman, DO  spironolactone (ALDACTONE) 50 MG tablet Take 1 tablet (50 mg total) by mouth daily. 06/06/22   Lindell Spar, MD  Tiotropium Bromide Monohydrate (SPIRIVA RESPIMAT) 2.5 MCG/ACT AERS Inhale 2 puffs into the lungs daily. 10/24/22   Lyndal Pulley, MD  Vitamin D, Ergocalciferol, (DRISDOL) 1.25 MG (50000 UNIT) CAPS capsule Take 1 capsule by mouth once a week 08/11/22   Lindell Spar, MD                                                                                                                                    Past Surgical History Past Surgical History:  Procedure Laterality Date   ABDOMINAL HYSTERECTOMY  2018   endometrial cancer, surgery done at [facility]  01/02/2017   Procedure: BIOPSY;  Surgeon: Danie Binder, MD;  [facility];  Service: Endoscopy;;  gastric biopsy   CESAREAN SECTION  1989   COLONOSCOPY WITH PROPOFOL N/A 01/02/2017   Dr. Oneida Alar: redundant colon, two 2-3 mm polyps in sigmoid colon (hyperplastic)   EAR CYST EXCISION Right 11/29/2021   Procedure: EXCISION OF EXTERNAL EAR LESION;  Surgeon: Jason Coop, DO;  Location: [facility];  Service: ENT;  Laterality: Right;   ESOPHAGOGASTRODUODENOSCOPY  05/22/2011   Dr. Oneida Alar: H.pylori gastritis    ESOPHAGOGASTRODUODENOSCOPY (EGD) WITH PROPOFOL N/A 01/02/2017   Dr. 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 s/p dilation, moderate gastritis (negative H.pylori)   FOOT SURGERY Left    tendon repair   IR US GUIDE VASC ACCESS LEFT  01/18/2021   OOPHORECTOMY     OTOPLASATY Left 06/15/2021   Procedure: Excision of left auricular lesion with endaural meatoplasty;  Surgeon: Jason Coop, DO;  Location: [facility];  Service: ENT;  Laterality: Left;   POLYPECTOMY  01/02/2017  Procedure: POLYPECTOMY;  Surgeon: Danie Binder, MD;  [facility];  Service: Endoscopy;;  sigmoid colon polyps times 2   REDUCTION  MAMMAPLASTY  1996   SAVORY DILATION N/A 01/02/2017   Procedure: SAVORY DILATION;  Surgeon: Danie Binder, MD;  [facility];  Service: Endoscopy;  Laterality: N/A;   TUBAL LIGATION     Family History Family History  Problem Relation Age of Onset   Diabetes Mother    Hypertension Mother    COPD Mother    Asthma Mother    Hypercholesterolemia Mother    Hypertension Sister    Hyperlipidemia Sister    Diabetes Sister    Asthma Brother    Alcohol abuse Brother    Asthma Son    Cancer Maternal Grandmother        lung, throat, breast   Alzheimer's disease Maternal Grandmother    Hypertension Maternal Grandmother    Hypercholesterolemia Maternal Grandmother    Heart disease Maternal Grandfather    Stroke Maternal Grandfather    Clotting disorder Maternal Grandfather        blood clots in legs   Hypertension Sister    Hyperlipidemia Sister    Stroke Maternal Aunt    Clotting disorder Maternal Aunt        blood clots in legs   Hypertension Maternal Aunt    Hypercholesterolemia Maternal Aunt    Hypertension Maternal Aunt    Asthma Maternal Aunt    Clotting disorder Maternal Uncle        blood clot -legs traveled to heart and he died of heart attack   Colon cancer Neg Hx     Social History Social History   Tobacco Use   Smoking status: Every Day    Packs/day: 1.50    Years: 22.00    Total pack years: 33.00    Types: Cigarettes   Smokeless tobacco: Never  Vaping Use   Vaping Use: Never used  Substance Use Topics   Alcohol use: No   Drug use: No   Allergies Ibuprofen and Lisinopril  Review of Systems Review of Systems  HENT:  Positive for dental problem and facial swelling.     Physical Exam Vital Signs  I have reviewed the triage vital signs BP (!) 177/79 (BP Location: Left Arm)   Pulse 65   Temp 99.2 F (37.3 C) (Oral)   Resp 17   Ht '5\' 5"'$  (1.651 m)   Wt 101.6 kg   LMP 09/20/2015 (Exact Date)   SpO2 99%   BMI 37.28 kg/m   Physical  Exam Vitals and nursing note reviewed.  Constitutional:      General: She is not in acute distress.    Appearance: She is well-developed.  HENT:     Head: Normocephalic.     Comments: Dental caries, tooth fracture at #6 Eyes:     Conjunctiva/sclera: Conjunctivae normal.  Cardiovascular:     Rate and Rhythm: Normal rate and regular rhythm.     Heart sounds: No murmur heard. Pulmonary:     Effort: Pulmonary effort is normal. No respiratory distress.     Breath sounds: Normal breath sounds.  Abdominal:     Palpations: Abdomen is soft.     Tenderness: There is no abdominal tenderness.  Musculoskeletal:        General: No swelling.     Cervical back: Neck supple.  Skin:    General: Skin is warm and dry.     Capillary Refill:  Capillary refill takes less than 2 seconds.  Neurological:     Mental Status: She is alert.  Psychiatric:        Mood and Affect: Mood normal.     ED Results and Treatments Labs (all labs ordered are listed, but only abnormal results are displayed) Labs Reviewed - No data to display                                                                                                                        Radiology No results found.  Pertinent labs & imaging results that were available during my care of the patient were reviewed by me and considered in my medical decision making (see MDM for details).  Medications Ordered in ED Medications  ketorolac (TORADOL) 15 MG/ML injection 15 mg (15 mg Intramuscular Given 11/04/22 0549)  amoxicillin-clavulanate (AUGMENTIN) 875-125 MG per tablet 1 tablet (1 tablet Oral Given 11/04/22 0548)                                                                                                                                     Procedures Procedures  (including critical care time)  Medical Decision Making / ED Course   This patient presents to the ED for concern of dental pain, facial swelling, this involves an extensive  number of treatment options, and is a complaint that carries with it a high risk of complications and morbidity.  The differential diagnosis includes dental caries, periapical abscess, submandibular abscess, maxillary abscess, Ludwig's angina  MDM: Patient seen emerged part for evaluation of dental pain and facial swelling.  Physical exam with dental caries to multiple right-sided maxillary teeth and a fractured tooth #6.  There is mild swelling over the right side of the face, but no submental swelling and no physical exam evidence of Ludwigs angina.  The patient is on Eliquis and thus we will be cautious with NSAIDs but she did receive 1 single dose of Toradol and antibiotics expanded to Augmentin coverage with first dose given in the ER today.  On reevaluation symptoms improved.  Patient be discharged with a short course of Norco and she will call her dentist for more urgent follow-up on Monday.   Additional history obtained: -Additional history obtained from mother -External records from outside source obtained and reviewed including: Chart review including previous notes, labs, imaging, consultation notes    Medicines ordered and prescription  drug management: Meds ordered this encounter  Medications   ketorolac (TORADOL) 15 MG/ML injection 15 mg   amoxicillin-clavulanate (AUGMENTIN) 875-125 MG per tablet 1 tablet   amoxicillin-clavulanate (AUGMENTIN) 875-125 MG tablet    Sig: Take 1 tablet by mouth every 12 (twelve) hours.    Dispense:  14 tablet    Refill:  0   acetaminophen (TYLENOL) 500 MG tablet    Sig: Take 2 tablets (1,000 mg total) by mouth every 8 (eight) hours.    Dispense:  180 tablet    Refill:  0   oxyCODONE (ROXICODONE) 5 MG immediate release tablet    Sig: Take 1 tablet (5 mg total) by mouth every 4 (four) hours as needed for breakthrough pain.    Dispense:  10 tablet    Refill:  0    -I have reviewed the patients home medicines and have made adjustments as  needed    Cardiac Monitoring: The patient was maintained on a cardiac monitor.  I personally viewed and interpreted the cardiac monitored which showed an underlying rhythm of: NSR  Social Determinants of Health:  Factors impacting patients care include: none   Reevaluation: After the interventions noted above, I reevaluated the patient and found that they have :improved  Co morbidities that complicate the patient evaluation  Past Medical History:  Diagnosis Date   Anemia    Anxiety    Arthritis    Asthma 05/25/2013   Autonomic neuropathy    Cancer of endometrium ([place]) 10/18/2015   Cellulitis, face 05/03/2021   Constipation    COPD (chronic obstructive pulmonary disease) ([place])    CTS (carpal tunnel syndrome)    Depression    Diabetes mellitus without complication ([place])    Dysphagia 12/14/2016   Encounter for screening colonoscopy    Endometrial cancer ([place]) 2017   Gastritis without bleeding    GERD (gastroesophageal reflux disease)    Gross hematuria 05/11/2019   Headache(784.0)    Heavy menses 10/11/2015   HTN (hypertension)    Hypercholesterolemia    Hypothyroidism    IBS (irritable bowel syndrome)    Osteoarthritis    osteoarthritis   Pancreatitis    per patient    Recurrent boils    buttocks and low back.   S/P laparoscopic hysterectomy 11/04/2015   Shortness of breath    Sleep apnea    CPAP machine   Uncontrolled type 2 diabetes mellitus with diabetic polyneuropathy, with long-term current use of insulin 08/04/2017   Uterine cancer ([place]) 2017   Vitamin D deficiency       Dispostion: I considered admission for this patient, but at this time she does not meet inpatient criteria for admission she is safe for discharge outpatient follow-up     Final Clinical Impression(s) / ED Diagnoses Final diagnoses:  Pain, dental  Dental caries     '@PCDICTATION'$ @    Teressa Lower, MD 11/04/22 [phone]

## 2022-11-09 ENCOUNTER — Ambulatory Visit (INDEPENDENT_AMBULATORY_CARE_PROVIDER_SITE_OTHER): Payer: Medicaid Other | Admitting: Gastroenterology

## 2022-11-09 ENCOUNTER — Encounter: Payer: Self-pay | Admitting: Gastroenterology

## 2022-11-09 VITALS — BP 166/77 | HR 65 | Temp 97.5°F | Ht 65.0 in | Wt 227.4 lb

## 2022-11-09 DIAGNOSIS — K219 Gastro-esophageal reflux disease without esophagitis: Secondary | ICD-10-CM

## 2022-11-09 DIAGNOSIS — K5900 Constipation, unspecified: Secondary | ICD-10-CM

## 2022-11-09 MED ORDER — LINACLOTIDE 290 MCG PO CAPS: ORAL_CAPSULE | ORAL | 3 refills | Status: DC

## 2022-11-09 MED ORDER — ONDANSETRON 4 MG PO TBDP: 4.0000 mg | ORAL_TABLET | Freq: Four times a day (QID) | ORAL | 2 refills | Status: AC | PRN

## 2022-11-09 MED ORDER — ESOMEPRAZOLE MAGNESIUM 40 MG PO CPDR: DELAYED_RELEASE_CAPSULE | ORAL | 3 refills | Status: DC

## 2022-11-09 NOTE — Patient Instructions (Signed)
I have refilled Nexium and Linzess for you.  I hope you start feeling better!  We will see you in 1 year!  I enjoyed seeing you again today! At our first visit, I mentioned how I value our relationship and want to provide genuine, compassionate, and quality care. You may receive a survey regarding your visit with me, and I welcome your feedback! Thanks so much for taking the time to complete this. I look forward to seeing you again.   Annitta Needs, PhD, ANP-BC [facility]

## 2022-11-09 NOTE — Progress Notes (Signed)
Gastroenterology Office Note     Primary Care Physician:  Lindell Spar, MD  Primary Gastroenterologist: Dr. Abbey Chatters    Chief Complaint   Chief Complaint  Patient presents with   Follow-up    Patient here today for a follow up visit on Gerd and Constipation. Patient takes Esomeprazole 40 mg bid and that is controlling her gerd symptoms. She is currently on Linzess 290 mcg once per day, which is helpful,but she is currently out of it, and will need a refill today.Needs refill on Ondansetron.      History of Present Illness   Sonya James is a 57 y.o. female presenting today in follow-up with a history of constipation, dysphagia, and GERD. Remote history of pancreatitis in 2013 (CT with possible findings, lipase mildly elevated 111). CT with contrast May 2019 without any abnormalities. Did note mild hepatic steatosis. Last colonoscopy in 2018 with hyperplastic polyps.    She is doing well today from a GI standpoint. Has done well with Nexium BID. Unable to decrease to onc daily. Linzess 145 mcg daily working well for constipation. No overt GI bleeding.  Her main concern today is a tooth abscess and has upcoming extraction. She is not feeling well due to this. No abdominal pain, N/V, changes in bowel habits, dysphagia, unexplained weight loss, lack of appetite, unexplained weight gain.     Past Medical History:  Diagnosis Date   Anemia    Anxiety    Arthritis    Asthma 05/25/2013   Autonomic neuropathy    Cancer of endometrium ([place]) 10/18/2015   Cellulitis, face 05/03/2021   Constipation    COPD (chronic obstructive pulmonary disease) (HCC)    CTS (carpal tunnel syndrome)    Depression    Diabetes mellitus without complication ([place])    Dysphagia 12/14/2016   Encounter for screening colonoscopy    Endometrial cancer ([place]) 2017   Gastritis without bleeding    GERD (gastroesophageal reflux disease)    Gross hematuria 05/11/2019   Headache(784.0)    Heavy menses  10/11/2015   HTN (hypertension)    Hypercholesterolemia    Hypothyroidism    IBS (irritable bowel syndrome)    Osteoarthritis    osteoarthritis   Pancreatitis    per patient    Recurrent boils    buttocks and low back.   S/P laparoscopic hysterectomy 11/04/2015   Shortness of breath    Sleep apnea    CPAP machine   Uncontrolled type 2 diabetes mellitus with diabetic polyneuropathy, with long-term current use of insulin 08/04/2017   Uterine cancer ([place]) 2017   Vitamin D deficiency     Past Surgical History:  Procedure Laterality Date   ABDOMINAL HYSTERECTOMY  2018   endometrial cancer, surgery done at [facility]  01/02/2017   Procedure: BIOPSY;  Surgeon: Danie Binder, MD;  [facility];  Service: Endoscopy;;  gastric biopsy   CESAREAN SECTION  1989   COLONOSCOPY WITH PROPOFOL N/A 01/02/2017   Dr. Oneida Alar: redundant colon, two 2-3 mm polyps in sigmoid colon (hyperplastic)   EAR CYST EXCISION Right 11/29/2021   Procedure: EXCISION OF EXTERNAL EAR LESION;  Surgeon: Jason Coop, DO;  Location: [facility];  Service: ENT;  Laterality: Right;   ESOPHAGOGASTRODUODENOSCOPY  05/22/2011   Dr. Oneida Alar: H.pylori gastritis    ESOPHAGOGASTRODUODENOSCOPY (EGD) WITH PROPOFOL N/A 01/02/2017   Dr. 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 s/p dilation, moderate gastritis (negative H.pylori)   FOOT SURGERY  Left    tendon repair   IR US GUIDE VASC ACCESS LEFT  01/18/2021   OOPHORECTOMY     OTOPLASATY Left 06/15/2021   Procedure: Excision of left auricular lesion with endaural meatoplasty;  Surgeon: Jason Coop, DO;  Location: [facility];  Service: ENT;  Laterality: Left;   POLYPECTOMY  01/02/2017   Procedure: POLYPECTOMY;  Surgeon: Danie Binder, MD;  [facility];  Service: Endoscopy;;  sigmoid colon polyps times 2   REDUCTION MAMMAPLASTY  1996   SAVORY DILATION N/A 01/02/2017   Procedure: SAVORY DILATION;  Surgeon: Danie Binder, MD;  [facility];   Service: Endoscopy;  Laterality: N/A;   TUBAL LIGATION      Current Outpatient Medications  Medication Sig Dispense Refill   Accu-Chek Softclix Lancets lancets To test glucose 4 times a day 150 each 2   acetaminophen (TYLENOL) 500 MG tablet Take 2 tablets (1,000 mg total) by mouth every 8 (eight) hours. 180 tablet 0   albuterol (VENTOLIN HFA) 108 (90 Base) MCG/ACT inhaler Inhale 2 puffs into the lungs every 6 (six) hours as needed for wheezing or shortness of breath. 18 g 5   amLODipine (NORVASC) 10 MG tablet Take 1 tablet (10 mg total) by mouth daily. 90 tablet 1   amoxicillin-clavulanate (AUGMENTIN) 875-125 MG tablet Take 1 tablet by mouth every 12 (twelve) hours. 14 tablet 0   apixaban (ELIQUIS) 5 MG TABS tablet Take 1 tablet (5 mg total) by mouth 2 (two) times daily. On 08/12/17 @ 9PM, start 1 tab (5 mg) two times daily. 60 tablet 5   atorvastatin (LIPITOR) 80 MG tablet Take 1 tablet (80 mg total) by mouth daily. 90 tablet 3   Blood Glucose Monitoring Suppl (ACCU-CHEK GUIDE ME) w/Device KIT 1 Piece by Does not apply route as directed. 1 kit 0   clotrimazole (GYNE-LOTRIMIN 3) 2 % vaginal cream Place 1 Applicatorful vaginally at bedtime. 21 g 0   Continuous Blood Gluc Sensor (DEXCOM G7 SENSOR) MISC 1 Device by Does not apply route as directed. 9 each 3   DULoxetine (CYMBALTA) 60 MG capsule Take 1 capsule (60 mg total) by mouth daily. 90 capsule 1   empagliflozin (JARDIANCE) 25 MG TABS tablet Take 1 tablet (25 mg total) by mouth daily before breakfast. 90 tablet 3   esomeprazole (NEXIUM) 40 MG capsule TAKE 1 CAPSULE BY MOUTH TWICE DAILY BEFORE MEAL(S) 180 capsule 3   furosemide (LASIX) 40 MG tablet Take 1 tablet by mouth once daily 90 tablet 0   glucose blood (ACCU-CHEK GUIDE) test strip Test glucose 4 times a day 150 each 2   insulin glargine (LANTUS SOLOSTAR) 100 UNIT/ML Solostar Pen Inject 34 Units into the skin daily. 30 mL 6   Insulin Pen Needle 31G X 5 MM MISC 1 Device by Does not apply  route daily in the afternoon. 100 each 2   ketoconazole (NIZORAL) 2 % cream Apply 1 Application topically daily. 30 g 0   labetalol (NORMODYNE) 100 MG tablet Take 1 tablet (100 mg total) by mouth in the morning AND 0.5 tablets (50 mg total) every evening. 135 tablet 1   LINZESS 290 MCG CAPS capsule TAKE 1 CAPSULE BY MOUTH ONCE DAILY BEFORE BREAKFAST 90 capsule 0   metFORMIN (GLUCOPHAGE) 1000 MG tablet Take 1 tablet (1,000 mg total) by mouth 2 (two) times daily with a meal. 90 tablet 3   Misc. Devices MISC Blood pressure monitoring device 1 each 0   montelukast (  SINGULAIR) 10 MG tablet TAKE 1 TABLET BY MOUTH AT BEDTIME 90 tablet 0   ondansetron (ZOFRAN-ODT) 4 MG disintegrating tablet Take 4 mg by mouth every 6 (six) hours as needed for nausea or vomiting.     oxyCODONE (ROXICODONE) 5 MG immediate release tablet Take 1 tablet (5 mg total) by mouth every 4 (four) hours as needed for breakthrough pain. 10 tablet 0   potassium chloride (KLOR-CON) 10 MEQ tablet TAKE 1 TABLET BY MOUTH ONCE DAILY IN THE EVENING 90 tablet 0   promethazine-dextromethorphan (PROMETHAZINE-DM) 6.25-15 MG/5ML syrup Take 5 mLs by mouth 4 (four) times daily as needed for cough. 118 mL 0   rizatriptan (MAXALT) 10 MG tablet Take 10 mg by mouth daily as needed for migraine.     spironolactone (ALDACTONE) 50 MG tablet Take 1 tablet (50 mg total) by mouth daily. 90 tablet 1   Tiotropium Bromide Monohydrate (SPIRIVA RESPIMAT) 2.5 MCG/ACT AERS Inhale 2 puffs into the lungs daily. 12 g 1   Vitamin D, Ergocalciferol, (DRISDOL) 1.25 MG (50000 UNIT) CAPS capsule Take 1 capsule by mouth once a week 8 capsule 0   No current facility-administered medications for this visit.    Allergies as of 11/09/2022 - Review Complete 11/09/2022  Allergen Reaction Noted   Ibuprofen  11/04/2022   Lisinopril Swelling 04/03/2022    Family History  Problem Relation Age of Onset   Diabetes Mother    Hypertension Mother    COPD Mother    Asthma Mother     Hypercholesterolemia Mother    Hypertension Sister    Hyperlipidemia Sister    Diabetes Sister    Asthma Brother    Alcohol abuse Brother    Asthma Son    Cancer Maternal Grandmother        lung, throat, breast   Alzheimer's disease Maternal Grandmother    Hypertension Maternal Grandmother    Hypercholesterolemia Maternal Grandmother    Heart disease Maternal Grandfather    Stroke Maternal Grandfather    Clotting disorder Maternal Grandfather        blood clots in legs   Hypertension Sister    Hyperlipidemia Sister    Stroke Maternal Aunt    Clotting disorder Maternal Aunt        blood clots in legs   Hypertension Maternal Aunt    Hypercholesterolemia Maternal Aunt    Hypertension Maternal Aunt    Asthma Maternal Aunt    Clotting disorder Maternal Uncle        blood clot -legs traveled to heart and he died of heart attack   Colon cancer Neg Hx     Social History   Socioeconomic History   Marital status: Married    Spouse name: Not on file   Number of children: 1   Years of education: Not on file   Highest education level: Not on file  Occupational History   Not on file  Tobacco Use   Smoking status: Every Day    Packs/day: 1.50    Years: 22.00    Total pack years: 33.00    Types: Cigarettes   Smokeless tobacco: Never  Vaping Use   Vaping Use: Never used  Substance and Sexual Activity   Alcohol use: No   Drug use: No   Sexual activity: Not Currently    Birth control/protection: Surgical    Comment: hysterectomy  Other Topics Concern   Not on file  Social History Narrative   Lives with husband and she has been married  to for 9 years.  Recently got evicted and has been living in a motel since last July.   She has 1 son that lives in [place].  Reports one grandbaby      Enjoys watching TV mostly animal [place].      Diet: Eats all food groups   Caffeine: Soda not diet, green tea, coffee about 3 cups of caffeine per day.   Water: Drinks 5-6 8 ounce  bottles throughout the day.      Wears a seatbelt.  Currently does not have a vehicle.  Smoke detectors in the facility she is living in.  Does not have any weapons.   Social Determinants of Health   Financial Resource Strain: High Risk (04/06/2022)   Overall Financial Resource Strain (CARDIA)    Difficulty of Paying Living Expenses: Very hard  Food Insecurity: Food Insecurity Present (04/06/2022)   Hunger Vital Sign    Worried About Running Out of Food in the Last Year: Often true    Ran Out of Food in the Last Year: Often true  Transportation Needs: No Transportation Needs (04/06/2022)   PRAPARE - Hydrologist (Medical): No    Lack of Transportation (Non-Medical): No  Physical Activity: Insufficiently Active (04/06/2022)   Exercise Vital Sign    Days of Exercise per Week: 1 day    Minutes of Exercise per Session: 30 min  Stress: Stress Concern Present (04/06/2022)   Experiment    Feeling of Stress : Rather much  Social Connections: Moderately Isolated (04/06/2022)   Social Connection and Isolation Panel [NHANES]    Frequency of Communication with Friends and Family: Once a week    Frequency of Social Gatherings with Friends and Family: Never    Attends Religious Services: 1 to 4 times per year    Active Member of [employer] or Organizations: No    Attends Archivist Meetings: Never    Marital Status: Married  Human resources officer Violence: Not At Risk (04/06/2022)   Humiliation, Afraid, Rape, and Kick questionnaire    Fear of Current or Ex-Partner: No    Emotionally Abused: No    Physically Abused: No    Sexually Abused: No     Review of Systems   Gen: Denies any fever, chills, fatigue, weight loss, lack of appetite.  CV: Denies chest pain, heart palpitations, peripheral edema, syncope.  Resp: Denies shortness of breath at rest or with exertion. Denies wheezing or cough.  GI: Denies  dysphagia or odynophagia. Denies jaundice, hematemesis, fecal incontinence. GU : Denies urinary burning, urinary frequency, urinary hesitancy MS: Denies joint pain, muscle weakness, cramps, or limitation of movement.  Derm: Denies rash, itching, dry skin Psych: Denies depression, anxiety, memory loss, and confusion Heme: Denies bruising, bleeding, and enlarged lymph nodes.   Physical Exam   BP (!) 166/77 (BP Location: Left Arm, Patient Position: Sitting, Cuff Size: Large)   Pulse 65   Temp (!) 97.5 F (36.4 C) (Temporal)   Ht 5\' 5"  (1.651 m)   Wt 227 lb 6.4 oz (103.1 kg)   LMP 09/20/2015 (Exact Date)   BMI 37.84 kg/m  General:   Alert and oriented. Pleasant and cooperative. Well-nourished and well-developed.  Head:  Normocephalic and atraumatic. Eyes:  Without icterus Abdomen:  +BS, soft, non-tender and non-distended. No HSM noted. No guarding or rebound. No masses appreciated.  Rectal:  Deferred  Msk:  Symmetrical without gross deformities. Normal posture. Extremities:  Without edema. Neurologic:  Alert and  oriented x4;  grossly normal neurologically. Skin:  Intact without significant lesions or rashes. Psych:  Alert and cooperative. Normal mood and affect.   Assessment   Sonya James is a 57 y.o. female presenting today in follow-up with a history of constipation, dysphagia, and GERD for routine follow-up.  GERD: well controlled on Nexium BID. Refills provided. Unable to decrease to once daily.  Constipation: managed on Linzess 145 mcg daily. Refills provided.     PLAN    Nexium BID Linzess 145 mcg daily Return in 1 year   Annitta Needs, PhD, [facility]

## 2022-11-14 ENCOUNTER — Encounter: Payer: Self-pay | Admitting: Orthopaedic Surgery

## 2022-11-14 ENCOUNTER — Ambulatory Visit (INDEPENDENT_AMBULATORY_CARE_PROVIDER_SITE_OTHER): Payer: Medicaid Other | Admitting: Orthopaedic Surgery

## 2022-11-14 DIAGNOSIS — M25562 Pain in left knee: Secondary | ICD-10-CM

## 2022-11-14 DIAGNOSIS — M25561 Pain in right knee: Secondary | ICD-10-CM

## 2022-11-14 DIAGNOSIS — G8929 Other chronic pain: Secondary | ICD-10-CM | POA: Diagnosis not present

## 2022-11-14 NOTE — Progress Notes (Signed)
PROCEDURE NOTE:  The patient requests injections of the right knee , verbal consent was obtained.  The right knee was prepped appropriately after time out was performed.   Sterile technique was observed and injection of 1 cc of DepoMedrol '40mg'$  with several cc's of plain xylocaine. Anesthesia was provided by ethyl chloride and a 20-gauge needle was used to inject the knee area. The injection was tolerated well.  A band aid dressing was applied.  The patient was advised to apply ice later today and tomorrow to the injection sight as needed.  PROCEDURE NOTE:  The patient requests injections of the left knee , verbal consent was obtained.  The left knee was prepped appropriately after time out was performed.   Sterile technique was observed and injection of 1 cc of DepoMedrol 40 mg with several cc's of plain xylocaine. Anesthesia was provided by ethyl chloride and a 20-gauge needle was used to inject the knee area. The injection was tolerated well.  A band aid dressing was applied.  The patient was advised to apply ice later today and tomorrow to the injection sight as needed.  Encounter Diagnosis  Name Primary?   Chronic pain of both knees Yes   Return in six weeks.  Call if any problem.  Precautions discussed.  Electronically Signed Sanjuana Kava, MD 3/5/20248:47 AM

## 2022-11-17 DIAGNOSIS — E669 Obesity, unspecified: Secondary | ICD-10-CM | POA: Diagnosis not present

## 2022-11-17 DIAGNOSIS — I10 Essential (primary) hypertension: Secondary | ICD-10-CM | POA: Diagnosis not present

## 2022-11-17 DIAGNOSIS — Z794 Long term (current) use of insulin: Secondary | ICD-10-CM | POA: Diagnosis not present

## 2022-11-17 DIAGNOSIS — E7800 Pure hypercholesterolemia, unspecified: Secondary | ICD-10-CM | POA: Diagnosis not present

## 2022-11-17 DIAGNOSIS — E119 Type 2 diabetes mellitus without complications: Secondary | ICD-10-CM | POA: Diagnosis not present

## 2022-11-17 DIAGNOSIS — Z6837 Body mass index (BMI) 37.0-37.9, adult: Secondary | ICD-10-CM | POA: Diagnosis not present

## 2022-11-20 ENCOUNTER — Ambulatory Visit: Payer: Medicaid Other | Admitting: Internal Medicine

## 2022-11-20 ENCOUNTER — Encounter: Payer: Self-pay | Admitting: Internal Medicine

## 2022-11-23 ENCOUNTER — Ambulatory Visit: Payer: Medicaid Other | Admitting: Internal Medicine

## 2022-11-23 ENCOUNTER — Encounter: Payer: Self-pay | Admitting: Internal Medicine

## 2022-11-24 ENCOUNTER — Ambulatory Visit: Payer: Medicaid Other | Admitting: Internal Medicine

## 2022-11-24 DIAGNOSIS — I10 Essential (primary) hypertension: Secondary | ICD-10-CM | POA: Diagnosis not present

## 2022-11-24 DIAGNOSIS — R0600 Dyspnea, unspecified: Secondary | ICD-10-CM | POA: Diagnosis not present

## 2022-11-24 DIAGNOSIS — Z01818 Encounter for other preprocedural examination: Secondary | ICD-10-CM | POA: Diagnosis not present

## 2022-11-27 ENCOUNTER — Ambulatory Visit (INDEPENDENT_AMBULATORY_CARE_PROVIDER_SITE_OTHER): Payer: Medicaid Other | Admitting: Internal Medicine

## 2022-11-27 ENCOUNTER — Encounter: Payer: Self-pay | Admitting: Internal Medicine

## 2022-11-27 VITALS — BP 135/84 | HR 88 | Ht 65.0 in | Wt 216.0 lb

## 2022-11-27 DIAGNOSIS — B3731 Acute candidiasis of vulva and vagina: Secondary | ICD-10-CM | POA: Insufficient documentation

## 2022-11-27 DIAGNOSIS — B370 Candidal stomatitis: Secondary | ICD-10-CM | POA: Diagnosis not present

## 2022-11-27 MED ORDER — FLUCONAZOLE 150 MG PO TABS: 150.0000 mg | ORAL_TABLET | Freq: Every day | ORAL | 0 refills | Status: DC

## 2022-11-27 NOTE — Assessment & Plan Note (Signed)
Likely due to recent antibiotic exposure Fluconazole 150 mg

## 2022-11-27 NOTE — Patient Instructions (Signed)
Please take Fluconazole once daily for 1 week.

## 2022-11-27 NOTE — Progress Notes (Signed)
Acute Office Visit  Subjective:    Patient ID: MYLIN COMPO, female    DOB: 16-Sep-1965, 57 y.o.   MRN: [id number]  Chief Complaint  Patient presents with   thursh    Patient states after taking antibiotics her tongue has thrush and is swollen, not being able to eat due to lack of taste    HPI Patient is in today for evaluation of whitish patch on her tongue.  She recently completed Augmentin for dental infection.  She has had whitish patch on the tongue and also reports discomfort while swallowing.  Denies any odynophagia.  She also reports lack of taste.  Denies any fever or chills.  Denies any oral ulcers.  She also reports itching in the vaginal area, but denies any vaginal discharge.  Denies any dysuria, hematuria or vaginal bleeding.  Past Medical History:  Diagnosis Date   Anemia    Anxiety    Arthritis    Asthma 05/25/2013   Autonomic neuropathy    Cancer of endometrium ([place]) 10/18/2015   Cellulitis, face 05/03/2021   Constipation    COPD (chronic obstructive pulmonary disease) (HCC)    CTS (carpal tunnel syndrome)    Depression    Diabetes mellitus without complication ([place])    Dysphagia 12/14/2016   Encounter for screening colonoscopy    Endometrial cancer ([place]) 2017   Gastritis without bleeding    GERD (gastroesophageal reflux disease)    Gross hematuria 05/11/2019   Headache(784.0)    Heavy menses 10/11/2015   HTN (hypertension)    Hypercholesterolemia    Hypothyroidism    IBS (irritable bowel syndrome)    Osteoarthritis    osteoarthritis   Pancreatitis    per patient    Recurrent boils    buttocks and low back.   S/P laparoscopic hysterectomy 11/04/2015   Shortness of breath    Sleep apnea    CPAP machine   Uncontrolled type 2 diabetes mellitus with diabetic polyneuropathy, with long-term current use of insulin 08/04/2017   Uterine cancer ([place]) 2017   Vitamin D deficiency     Past Surgical History:  Procedure Laterality Date   ABDOMINAL  HYSTERECTOMY  2018   endometrial cancer, surgery done at [facility]  01/02/2017   Procedure: BIOPSY;  Surgeon: Danie Binder, MD;  [facility];  Service: Endoscopy;;  gastric biopsy   CESAREAN SECTION  1989   COLONOSCOPY WITH PROPOFOL N/A 01/02/2017   Dr. Oneida Alar: redundant colon, two 2-3 mm polyps in sigmoid colon (hyperplastic)   EAR CYST EXCISION Right 11/29/2021   Procedure: EXCISION OF EXTERNAL EAR LESION;  Surgeon: Jason Coop, DO;  Location: [facility];  Service: ENT;  Laterality: Right;   ESOPHAGOGASTRODUODENOSCOPY  05/22/2011   Dr. Oneida Alar: H.pylori gastritis    ESOPHAGOGASTRODUODENOSCOPY (EGD) WITH PROPOFOL N/A 01/02/2017   Dr. 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 s/p dilation, moderate gastritis (negative H.pylori)   FOOT SURGERY Left    tendon repair   IR US GUIDE VASC ACCESS LEFT  01/18/2021   OOPHORECTOMY     OTOPLASATY Left 06/15/2021   Procedure: Excision of left auricular lesion with endaural meatoplasty;  Surgeon: Jason Coop, DO;  Location: [facility];  Service: ENT;  Laterality: Left;   POLYPECTOMY  01/02/2017   Procedure: POLYPECTOMY;  Surgeon: Danie Binder, MD;  [facility];  Service: Endoscopy;;  sigmoid colon polyps times 2   REDUCTION MAMMAPLASTY  1996   SAVORY DILATION N/A  01/02/2017   Procedure: SAVORY DILATION;  Surgeon: Danie Binder, MD;  [facility];  Service: Endoscopy;  Laterality: N/A;   TUBAL LIGATION      Family History  Problem Relation Age of Onset   Diabetes Mother    Hypertension Mother    COPD Mother    Asthma Mother    Hypercholesterolemia Mother    Hypertension Sister    Hyperlipidemia Sister    Diabetes Sister    Asthma Brother    Alcohol abuse Brother    Asthma Son    Cancer Maternal Grandmother        lung, throat, breast   Alzheimer's disease Maternal Grandmother    Hypertension Maternal Grandmother    Hypercholesterolemia Maternal Grandmother    Heart disease Maternal  Grandfather    Stroke Maternal Grandfather    Clotting disorder Maternal Grandfather        blood clots in legs   Hypertension Sister    Hyperlipidemia Sister    Stroke Maternal Aunt    Clotting disorder Maternal Aunt        blood clots in legs   Hypertension Maternal Aunt    Hypercholesterolemia Maternal Aunt    Hypertension Maternal Aunt    Asthma Maternal Aunt    Clotting disorder Maternal Uncle        blood clot -legs traveled to heart and he died of heart attack   Colon cancer Neg Hx     Social History   Socioeconomic History   Marital status: Married    Spouse name: Not on file   Number of children: 1   Years of education: Not on file   Highest education level: Not on file  Occupational History   Not on file  Tobacco Use   Smoking status: Every Day    Packs/day: 1.50    Years: 22.00    Additional pack years: 0.00    Total pack years: 33.00    Types: Cigarettes   Smokeless tobacco: Never  Vaping Use   Vaping Use: Never used  Substance and Sexual Activity   Alcohol use: No   Drug use: No   Sexual activity: Not Currently    Birth control/protection: Surgical    Comment: hysterectomy  Other Topics Concern   Not on file  Social History Narrative   Lives with husband and she has been married to for 9 years.  Recently got evicted and has been living in a motel since last July.   She has 1 son that lives in [place].  Reports one grandbaby      Enjoys watching TV mostly animal [place].      Diet: Eats all food groups   Caffeine: Soda not diet, green tea, coffee about 3 cups of caffeine per day.   Water: Drinks 5-6 8 ounce bottles throughout the day.      Wears a seatbelt.  Currently does not have a vehicle.  Smoke detectors in the facility she is living in.  Does not have any weapons.   Social Determinants of Health   Financial Resource Strain: High Risk (04/06/2022)   Overall Financial Resource Strain (CARDIA)    Difficulty of Paying Living Expenses: Very  hard  Food Insecurity: Food Insecurity Present (04/06/2022)   Hunger Vital Sign    Worried About Running Out of Food in the Last Year: Often true    Ran Out of Food in the Last Year: Often true  Transportation Needs: No Transportation Needs (04/06/2022)  PRAPARE - Hydrologist (Medical): No    Lack of Transportation (Non-Medical): No  Physical Activity: Insufficiently Active (04/06/2022)   Exercise Vital Sign    Days of Exercise per Week: 1 day    Minutes of Exercise per Session: 30 min  Stress: Stress Concern Present (04/06/2022)   [place]    Feeling of Stress : Rather much  Social Connections: Moderately Isolated (04/06/2022)   Social Connection and Isolation Panel [NHANES]    Frequency of Communication with Friends and Family: Once a week    Frequency of Social Gatherings with Friends and Family: Never    Attends Religious Services: 1 to 4 times per year    Active Member of [employer] or Organizations: No    Attends Archivist Meetings: Never    Marital Status: Married  Human resources officer Violence: Not At Risk (04/06/2022)   Humiliation, Afraid, Rape, and Kick questionnaire    Fear of Current or Ex-Partner: No    Emotionally Abused: No    Physically Abused: No    Sexually Abused: No    Outpatient Medications Prior to Visit  Medication Sig Dispense Refill   Accu-Chek Softclix Lancets lancets To test glucose 4 times a day 150 each 2   acetaminophen (TYLENOL) 500 MG tablet Take 2 tablets (1,000 mg total) by mouth every 8 (eight) hours. 180 tablet 0   albuterol (VENTOLIN HFA) 108 (90 Base) MCG/ACT inhaler Inhale 2 puffs into the lungs every 6 (six) hours as needed for wheezing or shortness of breath. 18 g 5   amLODipine (NORVASC) 10 MG tablet Take 1 tablet (10 mg total) by mouth daily. 90 tablet 1   apixaban (ELIQUIS) 5 MG TABS tablet Take 1 tablet (5 mg total) by mouth 2 (two) times  daily. On 08/12/17 @ 9PM, start 1 tab (5 mg) two times daily. 60 tablet 5   atorvastatin (LIPITOR) 80 MG tablet Take 1 tablet (80 mg total) by mouth daily. 90 tablet 3   Blood Glucose Monitoring Suppl (ACCU-CHEK GUIDE ME) w/Device KIT 1 Piece by Does not apply route as directed. 1 kit 0   clotrimazole (GYNE-LOTRIMIN 3) 2 % vaginal cream Place 1 Applicatorful vaginally at bedtime. 21 g 0   Continuous Blood Gluc Sensor (DEXCOM G7 SENSOR) MISC 1 Device by Does not apply route as directed. 9 each 3   DULoxetine (CYMBALTA) 60 MG capsule Take 1 capsule (60 mg total) by mouth daily. 90 capsule 1   empagliflozin (JARDIANCE) 25 MG TABS tablet Take 1 tablet (25 mg total) by mouth daily before breakfast. 90 tablet 3   esomeprazole (NEXIUM) 40 MG capsule TAKE 1 CAPSULE BY MOUTH TWICE DAILY BEFORE MEAL(S) 180 capsule 3   furosemide (LASIX) 40 MG tablet Take 1 tablet by mouth once daily 90 tablet 0   glucose blood (ACCU-CHEK GUIDE) test strip Test glucose 4 times a day 150 each 2   insulin glargine (LANTUS SOLOSTAR) 100 UNIT/ML Solostar Pen Inject 34 Units into the skin daily. 30 mL 6   Insulin Pen Needle 31G X 5 MM MISC 1 Device by Does not apply route daily in the afternoon. 100 each 2   ketoconazole (NIZORAL) 2 % cream Apply 1 Application topically daily. 30 g 0   labetalol (NORMODYNE) 100 MG tablet Take 1 tablet (100 mg total) by mouth in the morning AND 0.5 tablets (50 mg total) every evening. 135 tablet 1   linaclotide (LINZESS)  290 MCG CAPS capsule TAKE 1 CAPSULE BY MOUTH ONCE DAILY BEFORE BREAKFAST 90 capsule 3   metFORMIN (GLUCOPHAGE) 1000 MG tablet Take 1 tablet (1,000 mg total) by mouth 2 (two) times daily with a meal. 90 tablet 3   Misc. Devices MISC Blood pressure monitoring device 1 each 0   montelukast (SINGULAIR) 10 MG tablet TAKE 1 TABLET BY MOUTH AT BEDTIME 90 tablet 0   ondansetron (ZOFRAN-ODT) 4 MG disintegrating tablet Take 1 tablet (4 mg total) by mouth every 6 (six) hours as needed for  nausea or vomiting. 30 tablet 2   oxyCODONE (ROXICODONE) 5 MG immediate release tablet Take 1 tablet (5 mg total) by mouth every 4 (four) hours as needed for breakthrough pain. 10 tablet 0   potassium chloride (KLOR-CON) 10 MEQ tablet TAKE 1 TABLET BY MOUTH ONCE DAILY IN THE EVENING 90 tablet 0   promethazine-dextromethorphan (PROMETHAZINE-DM) 6.25-15 MG/5ML syrup Take 5 mLs by mouth 4 (four) times daily as needed for cough. 118 mL 0   rizatriptan (MAXALT) 10 MG tablet Take 10 mg by mouth daily as needed for migraine.     spironolactone (ALDACTONE) 50 MG tablet Take 1 tablet (50 mg total) by mouth daily. 90 tablet 1   Tiotropium Bromide Monohydrate (SPIRIVA RESPIMAT) 2.5 MCG/ACT AERS Inhale 2 puffs into the lungs daily. 12 g 1   Vitamin D, Ergocalciferol, (DRISDOL) 1.25 MG (50000 UNIT) CAPS capsule Take 1 capsule by mouth once a week 8 capsule 0   amoxicillin-clavulanate (AUGMENTIN) 875-125 MG tablet Take 1 tablet by mouth every 12 (twelve) hours. 14 tablet 0   No facility-administered medications prior to visit.    Allergies  Allergen Reactions   Ibuprofen     dizziness   Lisinopril Swelling    Swelling in face    Review of Systems  Constitutional:  Negative for chills and fever.  HENT:  Positive for trouble swallowing. Negative for congestion, sinus pressure, sinus pain and sore throat.   Eyes:  Negative for pain and discharge.  Respiratory:  Negative for cough and shortness of breath.   Cardiovascular:  Negative for chest pain and palpitations.  Gastrointestinal:  Negative for abdominal pain, diarrhea, nausea and vomiting.  Endocrine: Negative for polydipsia and polyuria.       Hot flashes  Genitourinary:  Negative for dysuria and hematuria.  Musculoskeletal:  Positive for back pain. Negative for neck pain and neck stiffness.  Skin:  Negative for rash.  Neurological:  Negative for dizziness and weakness.  Psychiatric/Behavioral:  Negative for agitation and behavioral problems.         Objective:    Physical Exam Vitals reviewed.  Constitutional:      General: She is not in acute distress.    Appearance: She is obese. She is not diaphoretic.  HENT:     Head: Normocephalic and atraumatic.     Nose: Nose normal. No congestion.     Mouth/Throat:     Mouth: Mucous membranes are moist.     Tongue: Lesions (Whitish patch) present.     Pharynx: No posterior oropharyngeal erythema.  Eyes:     General: No scleral icterus.    Extraocular Movements: Extraocular movements intact.  Neck:     Vascular: No carotid bruit.  Cardiovascular:     Rate and Rhythm: Normal rate and regular rhythm.     Pulses: Normal pulses.     Heart sounds: Murmur (Systolic over upper sternal border) heard.  Pulmonary:     Breath sounds: No  wheezing or rales.  Abdominal:     Palpations: Abdomen is soft.     Tenderness: There is no abdominal tenderness.  Musculoskeletal:     Cervical back: Neck supple. No tenderness.     Right lower leg: No edema.     Left lower leg: No edema.  Skin:    General: Skin is warm.     Findings: Lesion (Hyperkeratotic lesion, brownish, about 2 cm in diameter - likely seborrheic keratosis) present.     Comments: Papular lesions over thigh and right shoulder area, ruptured acneiform lesions  Neurological:     General: No focal deficit present.     Mental Status: She is alert and oriented to person, place, and time.     Sensory: No sensory deficit.     Motor: No weakness.  Psychiatric:        Mood and Affect: Mood normal.        Behavior: Behavior normal.     BP 135/84 (BP Location: Right Arm, Patient Position: Sitting, Cuff Size: Large)   Pulse 88   Ht 5\' 5"  (1.651 m)   Wt 216 lb (98 kg)   LMP 09/20/2015 (Exact Date)   SpO2 94%   BMI 35.94 kg/m  Wt Readings from Last 3 Encounters:  11/27/22 216 lb (98 kg)  11/09/22 227 lb 6.4 oz (103.1 kg)  11/04/22 224 lb (101.6 kg)        Assessment & Plan:   Problem List Items Addressed This Visit        Digestive   Oropharyngeal candidiasis - Primary    Started fluconazole 150 mg QD X 7 days (100 mg and 200 mg not on formulary) Advised to perform warm water gargling      Relevant Medications   fluconazole (DIFLUCAN) 150 MG tablet     Genitourinary   Candidal vaginitis    Likely due to recent antibiotic exposure Fluconazole 150 mg      Relevant Medications   fluconazole (DIFLUCAN) 150 MG tablet     Meds ordered this encounter  Medications   fluconazole (DIFLUCAN) 150 MG tablet    Sig: Take 1 tablet (150 mg total) by mouth daily.    Dispense:  7 tablet    Refill:  0     Jewell Ryans Keith Rake, MD

## 2022-11-27 NOTE — Assessment & Plan Note (Signed)
Started fluconazole 150 mg QD X 7 days (100 mg and 200 mg not on formulary) Advised to perform warm water gargling

## 2022-12-04 DIAGNOSIS — M545 Low back pain, unspecified: Secondary | ICD-10-CM | POA: Diagnosis not present

## 2022-12-04 DIAGNOSIS — G8929 Other chronic pain: Secondary | ICD-10-CM | POA: Diagnosis not present

## 2022-12-04 DIAGNOSIS — M47816 Spondylosis without myelopathy or radiculopathy, lumbar region: Secondary | ICD-10-CM | POA: Diagnosis not present

## 2022-12-05 ENCOUNTER — Telehealth: Payer: Self-pay | Admitting: Internal Medicine

## 2022-12-05 NOTE — Telephone Encounter (Signed)
Pt called stating she has already spoke to Dr. Posey Pronto about getting her teeth pulled. States dentist is needing a letter stating that her BP has nothing to do with pulling her teeth. Can this be sent?

## 2022-12-06 NOTE — Telephone Encounter (Signed)
Note printed and left at front desk.

## 2022-12-26 ENCOUNTER — Ambulatory Visit: Payer: Medicaid Other | Admitting: Orthopaedic Surgery

## 2022-12-28 ENCOUNTER — Ambulatory Visit: Payer: Medicaid Other | Admitting: Orthopaedic Surgery

## 2023-01-08 DIAGNOSIS — M47816 Spondylosis without myelopathy or radiculopathy, lumbar region: Secondary | ICD-10-CM | POA: Diagnosis not present

## 2023-01-09 ENCOUNTER — Ambulatory Visit: Payer: Medicaid Other | Admitting: Internal Medicine

## 2023-01-18 ENCOUNTER — Ambulatory Visit (INDEPENDENT_AMBULATORY_CARE_PROVIDER_SITE_OTHER): Payer: Medicaid Other | Admitting: Internal Medicine

## 2023-01-18 ENCOUNTER — Encounter: Payer: Self-pay | Admitting: Internal Medicine

## 2023-01-18 VITALS — BP 156/80 | HR 72 | Ht 65.0 in | Wt 222.8 lb

## 2023-01-18 DIAGNOSIS — Z7901 Long term (current) use of anticoagulants: Secondary | ICD-10-CM

## 2023-01-18 DIAGNOSIS — M4726 Other spondylosis with radiculopathy, lumbar region: Secondary | ICD-10-CM

## 2023-01-18 DIAGNOSIS — I10 Essential (primary) hypertension: Secondary | ICD-10-CM

## 2023-01-18 DIAGNOSIS — Z794 Long term (current) use of insulin: Secondary | ICD-10-CM | POA: Diagnosis not present

## 2023-01-18 DIAGNOSIS — E782 Mixed hyperlipidemia: Secondary | ICD-10-CM

## 2023-01-18 DIAGNOSIS — I2782 Chronic pulmonary embolism: Secondary | ICD-10-CM | POA: Diagnosis not present

## 2023-01-18 DIAGNOSIS — E1165 Type 2 diabetes mellitus with hyperglycemia: Secondary | ICD-10-CM | POA: Diagnosis not present

## 2023-01-18 MED ORDER — LANCET DEVICE MISC
1.0000 | Freq: Three times a day (TID) | 0 refills | Status: AC
Start: 2023-01-18 — End: 2023-02-17

## 2023-01-18 MED ORDER — BLOOD GLUCOSE MONITORING SUPPL DEVI
1.0000 | Freq: Three times a day (TID) | 0 refills | Status: AC
Start: 2023-01-18 — End: ?

## 2023-01-18 MED ORDER — AMLODIPINE BESYLATE 10 MG PO TABS: 10.0000 mg | ORAL_TABLET | Freq: Every day | ORAL | 1 refills | Status: DC

## 2023-01-18 MED ORDER — LANCETS MISC. MISC
1.0000 | Freq: Three times a day (TID) | 0 refills | Status: AC
Start: 2023-01-18 — End: 2023-02-17

## 2023-01-18 MED ORDER — BLOOD GLUCOSE TEST VI STRP
1.0000 | ORAL_STRIP | Freq: Three times a day (TID) | 0 refills | Status: AC
Start: 2023-01-18 — End: 2023-02-17

## 2023-01-18 MED ORDER — SPIRONOLACTONE 50 MG PO TABS: 50.0000 mg | ORAL_TABLET | Freq: Every day | ORAL | 1 refills | Status: DC

## 2023-01-18 MED ORDER — LABETALOL HCL 100 MG PO TABS: ORAL_TABLET | ORAL | 1 refills | Status: DC

## 2023-01-18 NOTE — Assessment & Plan Note (Addendum)
Lab Results  Component Value Date   HGBA1C 9.5 (A) 10/09/2022   Uncontrolled On Metformin, Jardiance and Levemir 40 U QD, followed by endocrinology - needs follow up Advised to follow diabetic diet On statin and ACEi Diabetic eye exam: Advised to follow up with Ophthalmology for diabetic eye exam

## 2023-01-18 NOTE — Progress Notes (Signed)
Established Patient Office Visit  Subjective:  Patient ID: Sonya James, female    DOB: 1966-08-06  Age: 57 y.o. MRN: [id number]  CC:  Chief Complaint  Patient presents with   Diabetes    Follow up   Bursitis    In right leg flared up 01/11/2023   Hypertension    Follow up    HPI Sonya James is a 57 y.o. female with past medical history of HTN, type II DM, HLD, COPD, OSA, PE, GERD, obesity and tobacco abuse who presents for f/u of her chronic medical conditions.  HTN: Her BP is elevated today.  Her lisinopril was recently discontinued due to angioedema. She still takes amlodipine, spironolactone and labetalol, but compliance is questionable.  Her dose of labetalol was adjusted to 100 mg QAM and 50 mg QPM as she was having dizziness with 100 mg BID dose.  She denies any headache, chest pain or palpitations.  Type II DM with HLD: Her HbA1c had increased to 9.5 now.  She is on Levemir 40 units, according to her endocrinology office visit note.  She had CGM, but has not refilled it.  She also takes metformin and Jardiance.  She takes Lipitor for HLD.  She complains of dyspnea and wheezing intermittently, and has been using albuterol inhaler for her COPD.  Denies any hemoptysis.  She reports right hip pain, and attributes it to bursitis.  Her pain is sharp, intermittent, and radiates towards medial side of the thigh.  She has change in her gait due to pain as well.  She has a history of lumbar spondylosis as well.  Past Medical History:  Diagnosis Date   Anemia    Anxiety    Arthritis    Asthma 05/25/2013   Autonomic neuropathy    Cancer of endometrium (HCC) 10/18/2015   Cellulitis, face 05/03/2021   Constipation    COPD (chronic obstructive pulmonary disease) (HCC)    CTS (carpal tunnel syndrome)    Depression    Diabetes mellitus without complication (HCC)    Dysphagia 12/14/2016   Encounter for screening colonoscopy    Endometrial cancer (HCC) 2017   Gastritis without  bleeding    GERD (gastroesophageal reflux disease)    Gross hematuria 05/11/2019   Headache(784.0)    Heavy menses 10/11/2015   HTN (hypertension)    Hypercholesterolemia    Hypothyroidism    IBS (irritable bowel syndrome)    Osteoarthritis    osteoarthritis   Pancreatitis    per patient    Recurrent boils    buttocks and low back.   S/P laparoscopic hysterectomy 11/04/2015   Shortness of breath    Sleep apnea    CPAP machine   Uncontrolled type 2 diabetes mellitus with diabetic polyneuropathy, with long-term current use of insulin 08/04/2017   Uterine cancer (HCC) 2017   Vitamin D deficiency     Past Surgical History:  Procedure Laterality Date   ABDOMINAL HYSTERECTOMY  2018   endometrial cancer, surgery done at [facility]    BIOPSY  01/02/2017   Procedure: BIOPSY;  Surgeon: West Bali, MD;  Location: [facility];  Service: Endoscopy;;  gastric biopsy   CESAREAN SECTION  1989   COLONOSCOPY WITH PROPOFOL N/A 01/02/2017   Dr. Darrick Penna: redundant colon, two 2-3 mm polyps in sigmoid colon (hyperplastic)   EAR CYST EXCISION Right 11/29/2021   Procedure: EXCISION OF EXTERNAL EAR LESION;  Surgeon: Laren Boom, DO;  Location: [facility] OR;  Service: ENT;  Laterality: Right;   ESOPHAGOGASTRODUODENOSCOPY  05/22/2011   Dr. Darrick Penna: H.pylori gastritis    ESOPHAGOGASTRODUODENOSCOPY (EGD) WITH PROPOFOL N/A 01/02/2017   Dr. Darrick Penna: possible web in proximal esophagus s/p dilation, moderate gastritis (negative H.pylori)   FOOT SURGERY Left    tendon repair   IR US GUIDE VASC ACCESS LEFT  01/18/2021   OOPHORECTOMY     OTOPLASATY Left 06/15/2021   Procedure: Excision of left auricular lesion with endaural meatoplasty;  Surgeon: Laren Boom, DO;  Location: [facility] OR;  Service: ENT;  Laterality: Left;   POLYPECTOMY  01/02/2017   Procedure: POLYPECTOMY;  Surgeon: West Bali, MD;  Location: [facility];  Service: Endoscopy;;  sigmoid colon polyps times 2   REDUCTION MAMMAPLASTY  1996    SAVORY DILATION N/A 01/02/2017   Procedure: SAVORY DILATION;  Surgeon: West Bali, MD;  Location: [facility];  Service: Endoscopy;  Laterality: N/A;   TUBAL LIGATION      Family History  Problem Relation Age of Onset   Diabetes Mother    Hypertension Mother    COPD Mother    Asthma Mother    Hypercholesterolemia Mother    Hypertension Sister    Hyperlipidemia Sister    Diabetes Sister    Asthma Brother    Alcohol abuse Brother    Asthma Son    Cancer Maternal Grandmother        lung, throat, breast   Alzheimer's disease Maternal Grandmother    Hypertension Maternal Grandmother    Hypercholesterolemia Maternal Grandmother    Heart disease Maternal Grandfather    Stroke Maternal Grandfather    Clotting disorder Maternal Grandfather        blood clots in legs   Hypertension Sister    Hyperlipidemia Sister    Stroke Maternal Aunt    Clotting disorder Maternal Aunt        blood clots in legs   Hypertension Maternal Aunt    Hypercholesterolemia Maternal Aunt    Hypertension Maternal Aunt    Asthma Maternal Aunt    Clotting disorder Maternal Uncle        blood clot -legs traveled to heart and he died of heart attack   Colon cancer Neg Hx     Social History   Socioeconomic History   Marital status: Married    Spouse name: Not on file   Number of children: 1   Years of education: Not on file   Highest education level: Not on file  Occupational History   Not on file  Tobacco Use   Smoking status: Every Day    Packs/day: 1.50    Years: 22.00    Additional pack years: 0.00    Total pack years: 33.00    Types: Cigarettes   Smokeless tobacco: Never  Vaping Use   Vaping Use: Never used  Substance and Sexual Activity   Alcohol use: No   Drug use: No   Sexual activity: Not Currently    Birth control/protection: Surgical    Comment: hysterectomy  Other Topics Concern   Not on file  Social History Narrative   Lives with husband and she has been married to  for 9 years.  Recently got evicted and has been living in a motel since last July.   She has 1 son that lives in [place].  Reports one grandbaby      Enjoys watching TV mostly animal [place].      Diet: Eats all food groups   Caffeine:  Soda not diet, green tea, coffee about 3 cups of caffeine per day.   Water: Drinks 5-6 8 ounce bottles throughout the day.      Wears a seatbelt.  Currently does not have a vehicle.  Smoke detectors in the facility she is living in.  Does not have any weapons.   Social Determinants of Health   Financial Resource Strain: High Risk (04/06/2022)   Overall Financial Resource Strain (CARDIA)    Difficulty of Paying Living Expenses: Very hard  Food Insecurity: Food Insecurity Present (04/06/2022)   Hunger Vital Sign    Worried About Running Out of Food in the Last Year: Often true    Ran Out of Food in the Last Year: Often true  Transportation Needs: No Transportation Needs (04/06/2022)   PRAPARE - Administrator, Civil Service (Medical): No    Lack of Transportation (Non-Medical): No  Physical Activity: Insufficiently Active (04/06/2022)   Exercise Vital Sign    Days of Exercise per Week: 1 day    Minutes of Exercise per Session: 30 min  Stress: Stress Concern Present (04/06/2022)   [employer] of Occupational Health - Occupational Stress Questionnaire    Feeling of Stress : Rather much  Social Connections: Moderately Isolated (04/06/2022)   Social Connection and Isolation Panel [NHANES]    Frequency of Communication with Friends and Family: Once a week    Frequency of Social Gatherings with Friends and Family: Never    Attends Religious Services: 1 to 4 times per year    Active Member of [employer] or Organizations: No    Attends Banker Meetings: Never    Marital Status: Married  Catering manager Violence: Not At Risk (04/06/2022)   Humiliation, Afraid, Rape, and Kick questionnaire    Fear of Current or Ex-Partner: No     Emotionally Abused: No    Physically Abused: No    Sexually Abused: No    Outpatient Medications Prior to Visit  Medication Sig Dispense Refill   Accu-Chek Softclix Lancets lancets To test glucose 4 times a day 150 each 2   albuterol (VENTOLIN HFA) 108 (90 Base) MCG/ACT inhaler Inhale 2 puffs into the lungs every 6 (six) hours as needed for wheezing or shortness of breath. 18 g 5   apixaban (ELIQUIS) 5 MG TABS tablet Take 1 tablet (5 mg total) by mouth 2 (two) times daily. On 08/12/17 @ 9PM, start 1 tab (5 mg) two times daily. 60 tablet 5   atorvastatin (LIPITOR) 80 MG tablet Take 1 tablet (80 mg total) by mouth daily. 90 tablet 3   Blood Glucose Monitoring Suppl (ACCU-CHEK GUIDE ME) w/Device KIT 1 Piece by Does not apply route as directed. 1 kit 0   clotrimazole (GYNE-LOTRIMIN 3) 2 % vaginal cream Place 1 Applicatorful vaginally at bedtime. 21 g 0   Continuous Blood Gluc Sensor (DEXCOM G7 SENSOR) MISC 1 Device by Does not apply route as directed. 9 each 3   DULoxetine (CYMBALTA) 60 MG capsule Take 1 capsule (60 mg total) by mouth daily. 90 capsule 1   empagliflozin (JARDIANCE) 25 MG TABS tablet Take 1 tablet (25 mg total) by mouth daily before breakfast. 90 tablet 3   esomeprazole (NEXIUM) 40 MG capsule TAKE 1 CAPSULE BY MOUTH TWICE DAILY BEFORE MEAL(S) 180 capsule 3   furosemide (LASIX) 40 MG tablet Take 1 tablet by mouth once daily 90 tablet 0   glucose blood (ACCU-CHEK GUIDE) test strip Test glucose 4 times a day 150  each 2   insulin glargine (LANTUS SOLOSTAR) 100 UNIT/ML Solostar Pen Inject 34 Units into the skin daily. 30 mL 6   Insulin Pen Needle 31G X 5 MM MISC 1 Device by Does not apply route daily in the afternoon. 100 each 2   ketoconazole (NIZORAL) 2 % cream Apply 1 Application topically daily. 30 g 0   linaclotide (LINZESS) 290 MCG CAPS capsule TAKE 1 CAPSULE BY MOUTH ONCE DAILY BEFORE BREAKFAST 90 capsule 3   metFORMIN (GLUCOPHAGE) 1000 MG tablet Take 1 tablet (1,000 mg total) by  mouth 2 (two) times daily with a meal. 90 tablet 3   Misc. Devices MISC Blood pressure monitoring device 1 each 0   montelukast (SINGULAIR) 10 MG tablet TAKE 1 TABLET BY MOUTH AT BEDTIME 90 tablet 0   ondansetron (ZOFRAN-ODT) 4 MG disintegrating tablet Take 1 tablet (4 mg total) by mouth every 6 (six) hours as needed for nausea or vomiting. 30 tablet 2   oxyCODONE (ROXICODONE) 5 MG immediate release tablet Take 1 tablet (5 mg total) by mouth every 4 (four) hours as needed for breakthrough pain. 10 tablet 0   potassium chloride (KLOR-CON) 10 MEQ tablet TAKE 1 TABLET BY MOUTH ONCE DAILY IN THE EVENING 90 tablet 0   promethazine-dextromethorphan (PROMETHAZINE-DM) 6.25-15 MG/5ML syrup Take 5 mLs by mouth 4 (four) times daily as needed for cough. 118 mL 0   rizatriptan (MAXALT) 10 MG tablet Take 10 mg by mouth daily as needed for migraine.     Tiotropium Bromide Monohydrate (SPIRIVA RESPIMAT) 2.5 MCG/ACT AERS Inhale 2 puffs into the lungs daily. 12 g 1   Vitamin D, Ergocalciferol, (DRISDOL) 1.25 MG (50000 UNIT) CAPS capsule Take 1 capsule by mouth once a week 8 capsule 0   amLODipine (NORVASC) 10 MG tablet Take 1 tablet (10 mg total) by mouth daily. 90 tablet 1   fluconazole (DIFLUCAN) 150 MG tablet Take 1 tablet (150 mg total) by mouth daily. 7 tablet 0   labetalol (NORMODYNE) 100 MG tablet Take 1 tablet (100 mg total) by mouth in the morning AND 0.5 tablets (50 mg total) every evening. 135 tablet 1   spironolactone (ALDACTONE) 50 MG tablet Take 1 tablet (50 mg total) by mouth daily. 90 tablet 1   No facility-administered medications prior to visit.    Allergies  Allergen Reactions   Ibuprofen     dizziness   Lisinopril Swelling    Swelling in face    ROS Review of Systems  Constitutional:  Negative for chills and fever.  HENT:  Negative for congestion, sinus pressure, sinus pain and sore throat.   Eyes:  Negative for pain and discharge.  Respiratory:  Negative for cough, shortness of  breath and wheezing.   Cardiovascular:  Negative for chest pain and palpitations.  Gastrointestinal:  Negative for abdominal pain, diarrhea, nausea and vomiting.  Endocrine: Negative for polydipsia and polyuria.       Hot flashes  Genitourinary:  Negative for dysuria and hematuria.  Musculoskeletal:  Positive for arthralgias and back pain. Negative for neck pain and neck stiffness.  Skin:  Negative for rash.  Neurological:  Negative for dizziness and weakness.  Psychiatric/Behavioral:  Negative for agitation and behavioral problems.       Objective:    Physical Exam Vitals reviewed.  Constitutional:      General: She is not in acute distress.    Appearance: She is obese. She is not diaphoretic.  HENT:     Head: Normocephalic and atraumatic.  Nose: Nose normal. No congestion.     Mouth/Throat:     Mouth: Mucous membranes are moist.     Pharynx: No posterior oropharyngeal erythema.  Eyes:     General: No scleral icterus.    Extraocular Movements: Extraocular movements intact.  Neck:     Vascular: No carotid bruit.  Cardiovascular:     Rate and Rhythm: Normal rate and regular rhythm.     Pulses: Normal pulses.     Heart sounds: Murmur (Systolic over upper sternal border) heard.  Pulmonary:     Breath sounds: Normal breath sounds. No rales.  Abdominal:     Palpations: Abdomen is soft.     Tenderness: There is no abdominal tenderness.  Musculoskeletal:     Cervical back: Neck supple. No tenderness.     Right lower leg: No edema.     Left lower leg: No edema.  Skin:    General: Skin is warm.     Comments: Papular lesions over thigh and right shoulder area, ruptured acneiform lesions  Neurological:     General: No focal deficit present.     Mental Status: She is alert and oriented to person, place, and time.     Sensory: No sensory deficit.     Motor: No weakness.  Psychiatric:        Mood and Affect: Mood normal.        Behavior: Behavior normal.     BP (!)  156/80 (BP Location: Left Arm)   Pulse 72   Ht 5\' 5"  (1.651 m)   Wt 222 lb 12.8 oz (101.1 kg)   LMP 09/20/2015 (Exact Date)   SpO2 96%   BMI 37.08 kg/m  Wt Readings from Last 3 Encounters:  01/18/23 222 lb 12.8 oz (101.1 kg)  11/27/22 216 lb (98 kg)  11/09/22 227 lb 6.4 oz (103.1 kg)    Lab Results  Component Value Date   TSH 1.800 03/06/2022   Lab Results  Component Value Date   WBC 6.5 04/02/2022   HGB 13.4 04/02/2022   HCT 42.5 04/02/2022   MCV 95.5 04/02/2022   PLT 351 04/02/2022   Lab Results  Component Value Date   NA 142 06/06/2022   K 4.4 06/06/2022   CO2 25 06/06/2022   GLUCOSE 159 (H) 06/06/2022   BUN 14 06/06/2022   CREATININE 0.91 06/06/2022   BILITOT 0.2 06/06/2022   ALKPHOS 159 (H) 06/06/2022   AST 11 06/06/2022   ALT 10 06/06/2022   PROT 6.7 06/06/2022   ALBUMIN 3.8 06/06/2022   CALCIUM 10.4 (H) 06/06/2022   ANIONGAP 3 (L) 04/02/2022   EGFR 74 06/06/2022   Lab Results  Component Value Date   CHOL 196 03/06/2022   Lab Results  Component Value Date   HDL 33 (L) 03/06/2022   Lab Results  Component Value Date   LDLCALC 141 (H) 03/06/2022   Lab Results  Component Value Date   TRIG 119 03/06/2022   Lab Results  Component Value Date   CHOLHDL 5.9 (H) 03/06/2022   Lab Results  Component Value Date   HGBA1C 9.5 (A) 10/09/2022      Assessment & Plan:   Problem List Items Addressed This Visit    Uncontrolled type 2 diabetes mellitus with hyperglycemia, with long-term current use of insulin (HCC) Lab Results  Component Value Date   HGBA1C 9.5 (A) 10/09/2022   Uncontrolled On Metformin, Jardiance and Levemir 40 U QD, followed by endocrinology - needs follow up Advised  to follow diabetic diet On statin and ACEi Diabetic eye exam: Advised to follow up with Ophthalmology for diabetic eye exam  Essential hypertension, benign BP Readings from Last 1 Encounters:  01/18/23 (!) 156/80   Uncontrolled with Amlodipine 10 mg QD,  Spironolactone 50 mg QD and Labetalol 50 mg BID Needs to take 100 mg QAM and 50 mg QPM Counseled for compliance with the medications Advised DASH diet and moderate exercise/walking, at least 150 mins/week  Pulmonary embolism (HCC) Unprovoked On Eliquis 5 mg BID Followed by heme-onc.  Other spondylosis with radiculopathy, lumbar region Has chronic low back pain and hip pain On Cymbalta - refilled, but unclear if she takes it Was on Norco Used to see [facility] neurology Had referred to pain clinic in [place] Avoid heavy lifting and frequent bending  Mixed hyperlipidemia Lipid profile reviewed Last cardiology note reviewed, referred to lipid clinic Currently on statin, plan to start PCSK9 inhibitor therapy    Meds ordered this encounter  Medications   amLODipine (NORVASC) 10 MG tablet    Sig: Take 1 tablet (10 mg total) by mouth daily.    Dispense:  90 tablet    Refill:  1   spironolactone (ALDACTONE) 50 MG tablet    Sig: Take 1 tablet (50 mg total) by mouth daily.    Dispense:  90 tablet    Refill:  1    PLEASE CANCEL THE OLD RX OF 25 MG.   labetalol (NORMODYNE) 100 MG tablet    Sig: Take 1 tablet (100 mg total) by mouth in the morning AND 0.5 tablets (50 mg total) every evening.    Dispense:  135 tablet    Refill:  1   Blood Glucose Monitoring Suppl DEVI    Sig: 1 each by Does not apply route in the morning, at noon, and at bedtime. May substitute to any manufacturer covered by patient's insurance.    Dispense:  1 each    Refill:  0   Glucose Blood (BLOOD GLUCOSE TEST STRIPS) STRP    Sig: 1 each by In Vitro route in the morning, at noon, and at bedtime. May substitute to any manufacturer covered by patient's insurance.    Dispense:  100 strip    Refill:  0   Lancet Device MISC    Sig: 1 each by Does not apply route in the morning, at noon, and at bedtime. May substitute to any manufacturer covered by patient's insurance.    Dispense:  1 each    Refill:  0   Lancets  Misc. MISC    Sig: 1 each by Does not apply route in the morning, at noon, and at bedtime. May substitute to any manufacturer covered by patient's insurance.    Dispense:  100 each    Refill:  0    Follow-up: Return in about 6 weeks (around 03/01/2023) for Annual physical.    Anabel Halon, MD

## 2023-01-18 NOTE — Assessment & Plan Note (Signed)
BP Readings from Last 1 Encounters:  01/18/23 (!) 156/80   Uncontrolled with Amlodipine 10 mg QD, Spironolactone 50 mg QD and Labetalol 50 mg BID Needs to take 100 mg QAM and 50 mg QPM Counseled for compliance with the medications Advised DASH diet and moderate exercise/walking, at least 150 mins/week

## 2023-01-18 NOTE — Assessment & Plan Note (Signed)
Has chronic low back pain and hip pain On Cymbalta - refilled, but unclear if she takes it Was on Norco Used to see [facility] neurology Had referred to pain clinic in [place] Avoid heavy lifting and frequent bending

## 2023-01-18 NOTE — Assessment & Plan Note (Signed)
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 

## 2023-01-18 NOTE — Assessment & Plan Note (Signed)
Lipid profile reviewed °Last cardiology note reviewed, referred to lipid clinic °Currently on statin, plan to start PCSK9 inhibitor therapy °

## 2023-01-18 NOTE — Patient Instructions (Addendum)
Please take Amlodipine, Spironolactone and Labetalol (1 tablet in the morning and half in the evening) as prescribed for blood pressure.  Please take Metformin, Jardiance and Lantus as prescribed for diabetes mellitus.  Please continue to take medications as prescribed.  Please continue to follow low carb diet and perform moderate exercise/walking at least 150 mins/week.  Please bring your medications in the next visit.

## 2023-01-19 DIAGNOSIS — Z6837 Body mass index (BMI) 37.0-37.9, adult: Secondary | ICD-10-CM | POA: Diagnosis not present

## 2023-01-19 DIAGNOSIS — E669 Obesity, unspecified: Secondary | ICD-10-CM | POA: Diagnosis not present

## 2023-01-19 DIAGNOSIS — Z794 Long term (current) use of insulin: Secondary | ICD-10-CM | POA: Diagnosis not present

## 2023-01-19 DIAGNOSIS — E119 Type 2 diabetes mellitus without complications: Secondary | ICD-10-CM | POA: Diagnosis not present

## 2023-01-19 LAB — CMP14+EGFR
ALT: 6 IU/L (ref 0–32)
AST: 9 IU/L (ref 0–40)
Albumin/Globulin Ratio: 1.2 (ref 1.2–2.2)
Albumin: 3.6 g/dL — ABNORMAL LOW (ref 3.8–4.9)
Alkaline Phosphatase: 139 IU/L — ABNORMAL HIGH (ref 44–121)
BUN/Creatinine Ratio: 19 (ref 9–23)
BUN: 13 mg/dL (ref 6–24)
Bilirubin Total: 0.2 mg/dL (ref 0.0–1.2)
CO2: 24 mmol/L (ref 20–29)
Calcium: 10.4 mg/dL — ABNORMAL HIGH (ref 8.7–10.2)
Chloride: 105 mmol/L (ref 96–106)
Creatinine, Ser: 0.69 mg/dL (ref 0.57–1.00)
Globulin, Total: 2.9 g/dL (ref 1.5–4.5)
Glucose: 148 mg/dL — ABNORMAL HIGH (ref 70–99)
Potassium: 4.4 mmol/L (ref 3.5–5.2)
Sodium: 142 mmol/L (ref 134–144)
Total Protein: 6.5 g/dL (ref 6.0–8.5)
eGFR: 101 mL/min/{1.73_m2} (ref >59)

## 2023-01-19 LAB — LIPID PANEL
Chol/HDL Ratio: 5.7 ratio — ABNORMAL HIGH (ref 0.0–4.4)
Cholesterol, Total: 194 mg/dL (ref 100–199)
HDL: 34 mg/dL — ABNORMAL LOW (ref >39)
LDL Chol Calc (NIH): 144 mg/dL — ABNORMAL HIGH (ref 0–99)
Triglycerides: 87 mg/dL (ref 0–149)
VLDL Cholesterol Cal: 16 mg/dL (ref 5–40)

## 2023-01-19 LAB — CBC
Hematocrit: 42.4 % (ref 34.0–46.6)
Hemoglobin: 13.8 g/dL (ref 11.1–15.9)
MCH: 30.5 pg (ref 26.6–33.0)
MCHC: 32.5 g/dL (ref 31.5–35.7)
MCV: 94 fL (ref 79–97)
Platelets: 458 10*3/uL — ABNORMAL HIGH (ref 150–450)
RBC: 4.53 x10E6/uL (ref 3.77–5.28)
RDW: 12.5 % (ref 11.7–15.4)
WBC: 8 10*3/uL (ref 3.4–10.8)

## 2023-01-19 LAB — HEMOGLOBIN A1C
Est. average glucose Bld gHb Est-mCnc: 243 mg/dL
Hgb A1c MFr Bld: 10.1 % — ABNORMAL HIGH (ref 4.8–5.6)

## 2023-01-25 DIAGNOSIS — I358 Other nonrheumatic aortic valve disorders: Secondary | ICD-10-CM | POA: Diagnosis not present

## 2023-01-25 DIAGNOSIS — I517 Cardiomegaly: Secondary | ICD-10-CM | POA: Diagnosis not present

## 2023-01-29 DIAGNOSIS — M47816 Spondylosis without myelopathy or radiculopathy, lumbar region: Secondary | ICD-10-CM | POA: Diagnosis not present

## 2023-02-06 ENCOUNTER — Other Ambulatory Visit: Payer: Self-pay | Admitting: Internal Medicine

## 2023-02-20 NOTE — Progress Notes (Unsigned)
[facility] [address], [place] [address]   CLINIC:  Medical Oncology/Hematology  PCP:  Anabel Halon, MD [address] [place] [address] [phone]   REASON FOR VISIT:  Follow-up for unprovoked pulmonary embolism   CURRENT THERAPY: Eliquis  INTERVAL HISTORY:   Sonya James 57 y.o. female returns for routine follow-up of her unprovoked PE on chronic anticoagulation.  She was last seen by Rojelio Brenner PA-C on 03/01/2022.  At today's visit, she reports feeling fair.  She has multiple chronic complaints unrelated to today's visit, further detailed in ROS below.  No recent hospitalizations, surgeries, or changes in baseline health status.  She is taking Eliquis 5 mg twice daily.  She has not had any major bleeding issues such as hematemesis, hematochezia, melena, or epistaxis.   She fell twice in the past year due to losing her balance, but denies any major injuries.  She has not had any interval DVT or PE since her last visit. She denies any unilateral leg swelling, pain, and erythema.  She has some shortness of breath and dyspnea on exertion secondary to COPD and asthma, which is at baseline.  She denies any new cough, chest pain, hemoptysis, or palpitations.  She denies any fevers, chills, night sweats, unintentional weight loss.   She has 70% energy and 100% appetite. She endorses that she is maintaining a stable weight.  ASSESSMENT & PLAN:  1.  Unprovoked pulmonary embolism, on chronic anticoagulation: - Diagnosed in November 2018, on Eliquis 5 mg twice daily, without any bleeding complications. - She did not have any risk factors for pulmonary embolism. - Her blood work was negative for lupus anticoagulant, anticardiolipin antibodies, antibeta-2 glycoprotein 1 antibodies.  It was also negative for other tests like antithrombin III, [place] [address] a mutation, protein C and protein S deficiencies. - CT CAP in May 2019 was negative for  any metastatic disease.  This was done because of her history of endometrial cancer.   - As she had unprovoked pulmonary embolism, her chance of recurrent DVT is about 8% at year 1 and about 40% at year 5.  At this time the benefits of continued anticoagulation outweigh the risk.  Hence we have recommended indefinite anticoagulation.  Patient is agreeable to this option. - She is continuing to tolerate Eliquis without any bleeding issues. - Most recent labs (02/21/2023): D-dimer negative, normal renal function, and no anemia. - No current symptoms concerning for recurrent DVT or PE - PLAN: At this time, benefits of anticoagulation outweigh risks.  Continue Eliquis.  We will reevaluate risk-benefit ratio in 1 year.   2.  History of endometrial cancer: - She underwent TLH, BSO, bilateral sentinel lymph node dissection on 11/04/2015 at [facility]. - It was a stage Ia, grade 1-low risk.  PLAN SUMMARY: >> Labs in 1 year = CBC/D, CMP, D-dimer >> OFFICE visit in 1 year     REVIEW OF SYSTEMS:   Review of Systems  Constitutional:  Positive for fatigue. Negative for appetite change, chills, diaphoresis, fever and unexpected weight change.  HENT:   Negative for lump/mass and nosebleeds.   Eyes:  Negative for eye problems.  Respiratory:  Positive for cough and shortness of breath (COPD). Negative for hemoptysis.   Cardiovascular:  Negative for chest pain, leg swelling and palpitations.  Gastrointestinal:  Positive for constipation. Negative for abdominal pain, blood in stool, diarrhea, nausea and vomiting.  Genitourinary:  Negative for hematuria.  Musculoskeletal:  Positive for arthralgias.  Skin: Negative.   Neurological:  Positive for dizziness and headaches (Migraines). Negative for light-headedness.  Hematological:  Does not bruise/bleed easily.  Psychiatric/Behavioral:  Positive for depression and sleep disturbance. The patient is nervous/anxious.      PHYSICAL EXAM:  ECOG PERFORMANCE  STATUS: 1 - Symptomatic but completely ambulatory  There were no vitals filed for this visit. There were no vitals filed for this visit. Physical Exam Constitutional:      Appearance: Normal appearance. She is obese.  Cardiovascular:     Heart sounds: Normal heart sounds.  Pulmonary:     Breath sounds: Normal breath sounds.  Musculoskeletal:     Right lower leg: No edema.     Left lower leg: No edema.  Neurological:     General: No focal deficit present.     Mental Status: Mental status is at baseline.  Psychiatric:        Behavior: Behavior normal. Behavior is cooperative.     PAST MEDICAL/SURGICAL HISTORY:  Past Medical History:  Diagnosis Date   Anemia    Anxiety    Arthritis    Asthma 05/25/2013   Autonomic neuropathy    Cancer of endometrium (HCC) 10/18/2015   Cellulitis, face 05/03/2021   Constipation    COPD (chronic obstructive pulmonary disease) (HCC)    CTS (carpal tunnel syndrome)    Depression    Diabetes mellitus without complication (HCC)    Dysphagia 12/14/2016   Encounter for screening colonoscopy    Endometrial cancer (HCC) 2017   Gastritis without bleeding    GERD (gastroesophageal reflux disease)    Gross hematuria 05/11/2019   Headache(784.0)    Heavy menses 10/11/2015   HTN (hypertension)    Hypercholesterolemia    Hypothyroidism    IBS (irritable bowel syndrome)    Osteoarthritis    osteoarthritis   Pancreatitis    per patient    Recurrent boils    buttocks and low back.   S/P laparoscopic hysterectomy 11/04/2015   Shortness of breath    Sleep apnea    CPAP machine   Uncontrolled type 2 diabetes mellitus with diabetic polyneuropathy, with long-term current use of insulin 08/04/2017   Uterine cancer (HCC) 2017   Vitamin D deficiency    Past Surgical History:  Procedure Laterality Date   ABDOMINAL HYSTERECTOMY  2018   endometrial cancer, surgery done at [facility]    BIOPSY  01/02/2017   Procedure: BIOPSY;  Surgeon: West Bali, MD;  Location: [facility];  Service: Endoscopy;;  gastric biopsy   CESAREAN SECTION  1989   COLONOSCOPY WITH PROPOFOL N/A 01/02/2017   Dr. Darrick Penna: redundant colon, two 2-3 mm polyps in sigmoid colon (hyperplastic)   EAR CYST EXCISION Right 11/29/2021   Procedure: EXCISION OF EXTERNAL EAR LESION;  Surgeon: Laren Boom, DO;  Location: [facility] OR;  Service: ENT;  Laterality: Right;   ESOPHAGOGASTRODUODENOSCOPY  05/22/2011   Dr. Darrick Penna: H.pylori gastritis    ESOPHAGOGASTRODUODENOSCOPY (EGD) WITH PROPOFOL N/A 01/02/2017   Dr. Darrick Penna: possible web in proximal esophagus s/p dilation, moderate gastritis (negative H.pylori)   FOOT SURGERY Left    tendon repair   IR US GUIDE VASC ACCESS LEFT  01/18/2021   OOPHORECTOMY     OTOPLASATY Left 06/15/2021   Procedure: Excision of left auricular lesion with endaural meatoplasty;  Surgeon: Laren Boom, DO;  Location: [facility] OR;  Service: ENT;  Laterality: Left;   POLYPECTOMY  01/02/2017   Procedure: POLYPECTOMY;  Surgeon: West Bali, MD;  Location: [facility];  Service: Endoscopy;;  sigmoid colon polyps times 2   REDUCTION MAMMAPLASTY  1996   SAVORY DILATION N/A 01/02/2017   Procedure: SAVORY DILATION;  Surgeon: West Bali, MD;  Location: [facility];  Service: Endoscopy;  Laterality: N/A;   TUBAL LIGATION      SOCIAL HISTORY:  Social History   Socioeconomic History   Marital status: Married    Spouse name: Not on file   Number of children: 1   Years of education: Not on file   Highest education level: Not on file  Occupational History   Not on file  Tobacco Use   Smoking status: Every Day    Packs/day: 1.50    Years: 22.00    Additional pack years: 0.00    Total pack years: 33.00    Types: Cigarettes   Smokeless tobacco: Never  Vaping Use   Vaping Use: Never used  Substance and Sexual Activity   Alcohol use: No   Drug use: No   Sexual activity: Not Currently    Birth control/protection: Surgical    Comment:  hysterectomy  Other Topics Concern   Not on file  Social History Narrative   Lives with husband and she has been married to for 9 years.  Recently got evicted and has been living in a motel since last July.   She has 1 son that lives in [place].  Reports one grandbaby      Enjoys watching TV mostly animal [place].      Diet: Eats all food groups   Caffeine: Soda not diet, green tea, coffee about 3 cups of caffeine per day.   Water: Drinks 5-6 8 ounce bottles throughout the day.      Wears a seatbelt.  Currently does not have a vehicle.  Smoke detectors in the facility she is living in.  Does not have any weapons.   Social Determinants of Health   Financial Resource Strain: High Risk (04/06/2022)   Overall Financial Resource Strain (CARDIA)    Difficulty of Paying Living Expenses: Very hard  Food Insecurity: Food Insecurity Present (04/06/2022)   Hunger Vital Sign    Worried About Running Out of Food in the Last Year: Often true    Ran Out of Food in the Last Year: Often true  Transportation Needs: No Transportation Needs (04/06/2022)   PRAPARE - Administrator, Civil Service (Medical): No    Lack of Transportation (Non-Medical): No  Physical Activity: Insufficiently Active (04/06/2022)   Exercise Vital Sign    Days of Exercise per Week: 1 day    Minutes of Exercise per Session: 30 min  Stress: Stress Concern Present (04/06/2022)   [employer] of Occupational Health - Occupational Stress Questionnaire    Feeling of Stress : Rather much  Social Connections: Moderately Isolated (04/06/2022)   Social Connection and Isolation Panel [NHANES]    Frequency of Communication with Friends and Family: Once a week    Frequency of Social Gatherings with Friends and Family: Never    Attends Religious Services: 1 to 4 times per year    Active Member of [employer] or Organizations: No    Attends Banker Meetings: Never    Marital Status: Married  Catering manager  Violence: Not At Risk (04/06/2022)   Humiliation, Afraid, Rape, and Kick questionnaire    Fear of Current or Ex-Partner: No    Emotionally Abused: No  Physically Abused: No    Sexually Abused: No    FAMILY HISTORY:  Family History  Problem Relation Age of Onset   Diabetes Mother    Hypertension Mother    COPD Mother    Asthma Mother    Hypercholesterolemia Mother    Hypertension Sister    Hyperlipidemia Sister    Diabetes Sister    Asthma Brother    Alcohol abuse Brother    Asthma Son    Cancer Maternal Grandmother        lung, throat, breast   Alzheimer's disease Maternal Grandmother    Hypertension Maternal Grandmother    Hypercholesterolemia Maternal Grandmother    Heart disease Maternal Grandfather    Stroke Maternal Grandfather    Clotting disorder Maternal Grandfather        blood clots in legs   Hypertension Sister    Hyperlipidemia Sister    Stroke Maternal Aunt    Clotting disorder Maternal Aunt        blood clots in legs   Hypertension Maternal Aunt    Hypercholesterolemia Maternal Aunt    Hypertension Maternal Aunt    Asthma Maternal Aunt    Clotting disorder Maternal Uncle        blood clot -legs traveled to heart and he died of heart attack   Colon cancer Neg Hx     CURRENT MEDICATIONS:  Outpatient Encounter Medications as of 02/21/2023  Medication Sig Note   Accu-Chek Softclix Lancets lancets To test glucose 4 times a day    albuterol (VENTOLIN HFA) 108 (90 Base) MCG/ACT inhaler Inhale 2 puffs into the lungs every 6 (six) hours as needed for wheezing or shortness of breath.    amLODipine (NORVASC) 10 MG tablet Take 1 tablet (10 mg total) by mouth daily.    apixaban (ELIQUIS) 5 MG TABS tablet Take 1 tablet (5 mg total) by mouth 2 (two) times daily. On 08/12/17 @ 9PM, start 1 tab (5 mg) two times daily.    atorvastatin (LIPITOR) 80 MG tablet Take 1 tablet (80 mg total) by mouth daily.    Blood Glucose Monitoring Suppl (ACCU-CHEK GUIDE ME) w/Device KIT  1 Piece by Does not apply route as directed.    Blood Glucose Monitoring Suppl DEVI 1 each by Does not apply route in the morning, at noon, and at bedtime. May substitute to any manufacturer covered by patient's insurance.    clotrimazole (GYNE-LOTRIMIN 3) 2 % vaginal cream Place 1 Applicatorful vaginally at bedtime. 11/09/2022: Prn.   Continuous Blood Gluc Sensor (DEXCOM G7 SENSOR) MISC 1 Device by Does not apply route as directed.    DULoxetine (CYMBALTA) 60 MG capsule Take 1 capsule (60 mg total) by mouth daily.    empagliflozin (JARDIANCE) 25 MG TABS tablet Take 1 tablet (25 mg total) by mouth daily before breakfast.    esomeprazole (NEXIUM) 40 MG capsule TAKE 1 CAPSULE BY MOUTH TWICE DAILY BEFORE MEAL(S)    furosemide (LASIX) 40 MG tablet Take 1 tablet by mouth once daily    glucose blood (ACCU-CHEK GUIDE) test strip Test glucose 4 times a day    insulin glargine (LANTUS SOLOSTAR) 100 UNIT/ML Solostar Pen Inject 34 Units into the skin daily.    Insulin Pen Needle 31G X 5 MM MISC 1 Device by Does not apply route daily in the afternoon.    ketoconazole (NIZORAL) 2 % cream Apply 1 Application topically daily.    labetalol (NORMODYNE) 100 MG tablet Take 1 tablet (100 mg total) by  mouth in the morning AND 0.5 tablets (50 mg total) every evening.    linaclotide (LINZESS) 290 MCG CAPS capsule TAKE 1 CAPSULE BY MOUTH ONCE DAILY BEFORE BREAKFAST    metFORMIN (GLUCOPHAGE) 1000 MG tablet Take 1 tablet (1,000 mg total) by mouth 2 (two) times daily with a meal.    Misc. Devices MISC Blood pressure monitoring device    montelukast (SINGULAIR) 10 MG tablet TAKE 1 TABLET BY MOUTH AT BEDTIME    ondansetron (ZOFRAN-ODT) 4 MG disintegrating tablet Take 1 tablet (4 mg total) by mouth every 6 (six) hours as needed for nausea or vomiting.    oxyCODONE (ROXICODONE) 5 MG immediate release tablet Take 1 tablet (5 mg total) by mouth every 4 (four) hours as needed for breakthrough pain.    potassium chloride (KLOR-CON)  10 MEQ tablet TAKE 1 TABLET BY MOUTH ONCE DAILY IN THE EVENING    promethazine-dextromethorphan (PROMETHAZINE-DM) 6.25-15 MG/5ML syrup Take 5 mLs by mouth 4 (four) times daily as needed for cough.    rizatriptan (MAXALT) 10 MG tablet Take 10 mg by mouth daily as needed for migraine.    spironolactone (ALDACTONE) 50 MG tablet Take 1 tablet (50 mg total) by mouth daily.    Tiotropium Bromide Monohydrate (SPIRIVA RESPIMAT) 2.5 MCG/ACT AERS Inhale 2 puffs into the lungs daily.    Vitamin D, Ergocalciferol, (DRISDOL) 1.25 MG (50000 UNIT) CAPS capsule Take 1 capsule by mouth once a week    No facility-administered encounter medications on file as of 02/21/2023.    ALLERGIES:  Allergies  Allergen Reactions   Ibuprofen     dizziness   Lisinopril Swelling    Swelling in face    LABORATORY DATA:  I have reviewed the labs as listed.  CBC    Component Value Date/Time   WBC 8.0 01/18/2023 1357   WBC 6.5 04/02/2022 1334   RBC 4.53 01/18/2023 1357   RBC 4.45 04/02/2022 1334   HGB 13.8 01/18/2023 1357   HCT 42.4 01/18/2023 1357   PLT 458 (H) 01/18/2023 1357   MCV 94 01/18/2023 1357   MCH 30.5 01/18/2023 1357   MCH 30.1 04/02/2022 1334   MCHC 32.5 01/18/2023 1357   MCHC 31.5 04/02/2022 1334   RDW 12.5 01/18/2023 1357   LYMPHSABS 2.2 04/02/2022 1334   LYMPHSABS 4.3 (H) 11/01/2020 0845   MONOABS 0.4 04/02/2022 1334   EOSABS 0.4 04/02/2022 1334   EOSABS 0.2 11/01/2020 0845   BASOSABS 0.1 04/02/2022 1334   BASOSABS 0.0 11/01/2020 0845      Latest Ref Rng & Units 01/18/2023    1:57 PM 06/06/2022    1:44 PM 04/02/2022    1:34 PM  CMP  Glucose 70 - 99 mg/dL 409  811  914   BUN 6 - 24 mg/dL 13  14  11    Creatinine 0.57 - 1.00 mg/dL 7.82  9.56  2.13   Sodium 134 - 144 mmol/L 142  142  139   Potassium 3.5 - 5.2 mmol/L 4.4  4.4  4.2   Chloride 96 - 106 mmol/L 105  104  108   CO2 20 - 29 mmol/L 24  25  28    Calcium 8.7 - 10.2 mg/dL 08.6  57.8  46.9   Total Protein 6.0 - 8.5 g/dL 6.5  6.7   7.3   Total Bilirubin 0.0 - 1.2 mg/dL 0.2  0.2  0.5   Alkaline Phos 44 - 121 IU/L 139  159  106   AST 0 - 40  IU/L 9  11  11    ALT 0 - 32 IU/L 6  10  11      DIAGNOSTIC IMAGING:  I have independently reviewed the relevant imaging and discussed with the patient.   WRAP UP:  All questions were answered. The patient knows to call the clinic with any problems, questions or concerns.  Medical decision making: Low  Time spent on visit: I spent 15 minutes counseling the patient face to face. The total time spent in the appointment was 22 minutes and more than 50% was on counseling.  Carnella Guadalajara, PA-C  02/21/2023 10:16 AM

## 2023-02-21 ENCOUNTER — Inpatient Hospital Stay: Payer: Medicaid Other

## 2023-02-21 ENCOUNTER — Inpatient Hospital Stay: Payer: Medicaid Other | Attending: Physician Assistant | Admitting: Physician Assistant

## 2023-02-21 VITALS — BP 184/84 | HR 57 | Temp 99.3°F | Resp 16 | Wt 222.2 lb

## 2023-02-21 DIAGNOSIS — Z90722 Acquired absence of ovaries, bilateral: Secondary | ICD-10-CM | POA: Insufficient documentation

## 2023-02-21 DIAGNOSIS — F17210 Nicotine dependence, cigarettes, uncomplicated: Secondary | ICD-10-CM | POA: Insufficient documentation

## 2023-02-21 DIAGNOSIS — Z7901 Long term (current) use of anticoagulants: Secondary | ICD-10-CM

## 2023-02-21 DIAGNOSIS — Z90710 Acquired absence of both cervix and uterus: Secondary | ICD-10-CM | POA: Diagnosis not present

## 2023-02-21 DIAGNOSIS — Z8542 Personal history of malignant neoplasm of other parts of uterus: Secondary | ICD-10-CM | POA: Diagnosis not present

## 2023-02-21 DIAGNOSIS — Z86711 Personal history of pulmonary embolism: Secondary | ICD-10-CM | POA: Diagnosis present

## 2023-02-21 DIAGNOSIS — Z9079 Acquired absence of other genital organ(s): Secondary | ICD-10-CM | POA: Insufficient documentation

## 2023-02-21 DIAGNOSIS — I2782 Chronic pulmonary embolism: Secondary | ICD-10-CM | POA: Diagnosis not present

## 2023-02-21 LAB — CBC
HCT: 46 % (ref 36.0–46.0)
Hemoglobin: 14.7 g/dL (ref 12.0–15.0)
MCH: 30.2 pg (ref 26.0–34.0)
MCHC: 32 g/dL (ref 30.0–36.0)
MCV: 94.5 fL (ref 80.0–100.0)
Platelets: 324 10*3/uL (ref 150–400)
RBC: 4.87 MIL/uL (ref 3.87–5.11)
RDW: 12.3 % (ref 11.5–15.5)
WBC: 8 10*3/uL (ref 4.0–10.5)
nRBC: 0 % (ref 0.0–0.2)

## 2023-02-21 LAB — COMPREHENSIVE METABOLIC PANEL
ALT: 13 U/L (ref 0–44)
AST: 12 U/L — ABNORMAL LOW (ref 15–41)
Albumin: 3.6 g/dL (ref 3.5–5.0)
Alkaline Phosphatase: 104 U/L (ref 38–126)
Anion gap: 8 (ref 5–15)
BUN: 16 mg/dL (ref 6–20)
CO2: 26 mmol/L (ref 22–32)
Calcium: 9.5 mg/dL (ref 8.9–10.3)
Chloride: 103 mmol/L (ref 98–111)
Creatinine, Ser: 0.75 mg/dL (ref 0.44–1.00)
GFR, Estimated: >60 mL/min (ref >60)
Glucose, Bld: 234 mg/dL — ABNORMAL HIGH (ref 70–99)
Potassium: 3.9 mmol/L (ref 3.5–5.1)
Sodium: 137 mmol/L (ref 135–145)
Total Bilirubin: 0.4 mg/dL (ref 0.3–1.2)
Total Protein: 7.7 g/dL (ref 6.5–8.1)

## 2023-02-21 LAB — D-DIMER, QUANTITATIVE: D-Dimer, Quant: 0.48 ug/mL-FEU (ref 0.00–0.50)

## 2023-02-21 NOTE — Patient Instructions (Signed)
[facility] at [facility] **VISIT SUMMARY & IMPORTANT INSTRUCTIONS **   You were seen today by Rojelio Brenner PA-C for your history of blood clots.   Continue to take Eliquis 5 mg twice daily for blood thinner. Be careful to avoid any injury or bleeding. Watch for symptoms of blood clots coming back and seek immediate medical attention if you have any new chest pain or difficulty breathing.  FOLLOW-UP APPOINTMENT: 1 year  ** Thank you for trusting me with your healthcare!  I strive to provide all of my patients with quality care at each visit.  If you receive a survey for this visit, I would be so grateful to you for taking the time to provide feedback.  Thank you in advance!  ~ Thayer Inabinet                   Dr. Doreatha Massed   &   Rojelio Brenner, PA-C   - - - - - - - - - - - - - - - - - -    Thank you for choosing [facility] at [facility] to provide your oncology and hematology care.  To afford each patient quality time with our provider, please arrive at least 15 minutes before your scheduled appointment time.   If you have a lab appointment with the Cancer Center please come in thru the Main Entrance and check in at the main information desk.  You need to re-schedule your appointment should you arrive 10 or more minutes late.  We strive to give you quality time with our providers, and arriving late affects you and other patients whose appointments are after yours.  Also, if you no show three or more times for appointments you may be dismissed from the clinic at the providers discretion.     Again, thank you for choosing [facility].  Our hope is that these requests will decrease the amount of time that you wait before being seen by our physicians.       _____________________________________________________________  Should you have questions after your visit to [facility], please contact our office at [phone] and follow the prompts.  Our office hours are 8:00 a.m. and 4:30 p.m. Monday - Friday.  Please note that voicemails left after 4:00 p.m. may not be returned until the following business day.  We are closed weekends and major holidays.  You do have access to a nurse 24-7, just call the main number to the clinic [phone] and do not press any options, hold on the line and a nurse will answer the phone.    For prescription refill requests, have your pharmacy contact our office and allow 72 hours.

## 2023-03-01 ENCOUNTER — Encounter: Payer: Medicaid Other | Admitting: Internal Medicine

## 2023-03-19 DIAGNOSIS — M47816 Spondylosis without myelopathy or radiculopathy, lumbar region: Secondary | ICD-10-CM | POA: Diagnosis not present

## 2023-04-18 ENCOUNTER — Encounter: Payer: Self-pay | Admitting: Orthopaedic Surgery

## 2023-04-18 ENCOUNTER — Ambulatory Visit (INDEPENDENT_AMBULATORY_CARE_PROVIDER_SITE_OTHER): Payer: Medicaid Other | Admitting: Orthopaedic Surgery

## 2023-04-18 DIAGNOSIS — G8929 Other chronic pain: Secondary | ICD-10-CM | POA: Diagnosis not present

## 2023-04-18 DIAGNOSIS — M25561 Pain in right knee: Secondary | ICD-10-CM | POA: Diagnosis not present

## 2023-04-18 DIAGNOSIS — M25512 Pain in left shoulder: Secondary | ICD-10-CM | POA: Diagnosis not present

## 2023-04-18 NOTE — Progress Notes (Signed)
PROCEDURE NOTE:  The patient requests injections of the right knee , verbal consent was obtained.  The right knee was prepped appropriately after time out was performed.   Sterile technique was observed and injection of 1 cc of DepoMedrol 40mg  with several cc's of plain xylocaine. Anesthesia was provided by ethyl chloride and a 20-gauge needle was used to inject the knee area. The injection was tolerated well.  A band aid dressing was applied.  The patient was advised to apply ice later today and tomorrow to the injection sight as needed.  PROCEDURE NOTE:  The patient request injection, verbal consent was obtained.  The left shoulder was prepped appropriately after time out was performed.   Sterile technique was observed and injection of 1 cc of DepoMedrol 40mg  with several cc's of plain xylocaine. Anesthesia was provided by ethyl chloride and a 20-gauge needle was used to inject the shoulder area. A posterior approach was used.  The injection was tolerated well.  A band aid dressing was applied.  The patient was advised to apply ice later today and tomorrow to the injection sight as needed.  Encounter Diagnoses  Name Primary?   Chronic pain of right knee Yes   Chronic left shoulder pain    Return in six weeks.  Call if any problem.  Precautions discussed.  Electronically Signed Darreld Mclean, MD 8/7/20249:51 AM

## 2023-04-18 NOTE — Addendum Note (Signed)
Addended by: Debby Bud R on: 04/18/2023 10:47 AM   Modules accepted: Orders

## 2023-05-03 DIAGNOSIS — I5032 Chronic diastolic (congestive) heart failure: Secondary | ICD-10-CM | POA: Diagnosis not present

## 2023-05-03 DIAGNOSIS — Z86711 Personal history of pulmonary embolism: Secondary | ICD-10-CM | POA: Diagnosis not present

## 2023-05-03 DIAGNOSIS — I10 Essential (primary) hypertension: Secondary | ICD-10-CM | POA: Diagnosis not present

## 2023-05-03 DIAGNOSIS — E782 Mixed hyperlipidemia: Secondary | ICD-10-CM | POA: Diagnosis not present

## 2023-05-03 DIAGNOSIS — R0600 Dyspnea, unspecified: Secondary | ICD-10-CM | POA: Diagnosis not present

## 2023-05-03 DIAGNOSIS — J449 Chronic obstructive pulmonary disease, unspecified: Secondary | ICD-10-CM | POA: Diagnosis not present

## 2023-05-08 ENCOUNTER — Ambulatory Visit (INDEPENDENT_AMBULATORY_CARE_PROVIDER_SITE_OTHER): Payer: Medicaid Other | Admitting: Internal Medicine

## 2023-05-08 ENCOUNTER — Encounter: Payer: Self-pay | Admitting: Internal Medicine

## 2023-05-08 VITALS — BP 150/78 | HR 68 | Ht 65.0 in | Wt 222.6 lb

## 2023-05-08 DIAGNOSIS — Z0001 Encounter for general adult medical examination with abnormal findings: Secondary | ICD-10-CM | POA: Diagnosis not present

## 2023-05-08 DIAGNOSIS — E782 Mixed hyperlipidemia: Secondary | ICD-10-CM

## 2023-05-08 DIAGNOSIS — E1142 Type 2 diabetes mellitus with diabetic polyneuropathy: Secondary | ICD-10-CM | POA: Diagnosis not present

## 2023-05-08 DIAGNOSIS — H61032 Chondritis of left external ear: Secondary | ICD-10-CM | POA: Diagnosis not present

## 2023-05-08 DIAGNOSIS — E1165 Type 2 diabetes mellitus with hyperglycemia: Secondary | ICD-10-CM | POA: Diagnosis not present

## 2023-05-08 DIAGNOSIS — F331 Major depressive disorder, recurrent, moderate: Secondary | ICD-10-CM | POA: Diagnosis not present

## 2023-05-08 DIAGNOSIS — Z23 Encounter for immunization: Secondary | ICD-10-CM | POA: Diagnosis not present

## 2023-05-08 DIAGNOSIS — I10 Essential (primary) hypertension: Secondary | ICD-10-CM | POA: Diagnosis not present

## 2023-05-08 DIAGNOSIS — Z794 Long term (current) use of insulin: Secondary | ICD-10-CM

## 2023-05-08 DIAGNOSIS — J449 Chronic obstructive pulmonary disease, unspecified: Secondary | ICD-10-CM | POA: Diagnosis not present

## 2023-05-08 MED ORDER — ALBUTEROL SULFATE HFA 108 (90 BASE) MCG/ACT IN AERS
2.0000 | INHALATION_SPRAY | Freq: Four times a day (QID) | RESPIRATORY_TRACT | 5 refills | Status: AC | PRN
Start: 2023-05-08 — End: ?

## 2023-05-08 MED ORDER — LABETALOL HCL 100 MG PO TABS
100.0000 mg | ORAL_TABLET | Freq: Two times a day (BID) | ORAL | 1 refills | Status: DC
Start: 2023-05-08 — End: 2024-05-30

## 2023-05-08 MED ORDER — CIPROFLOXACIN HCL 500 MG PO TABS
500.0000 mg | ORAL_TABLET | Freq: Two times a day (BID) | ORAL | 0 refills | Status: AC
Start: 2023-05-08 — End: 2023-05-13

## 2023-05-08 MED ORDER — DULOXETINE HCL 60 MG PO CPEP: 60.0000 mg | ORAL_CAPSULE | Freq: Every day | ORAL | 1 refills | Status: DC

## 2023-05-08 NOTE — Patient Instructions (Addendum)
Please take following medications:  For blood pressure: Amlodipine 10 mg once daily, Labetalol 100 mg twice daily, Spironolactone 50 mg once daily  For diabetes: Metformin 1000 mg twice daily, Jardiance 25 mg once daily, Lantus 40 Units at bedtime.  For cholesterol: Atorvastatin 80 mg at bedtime.  For history of PE: Eliquis 5 mg twice daily.  Inhalers: Spiriva daily and Albuterol as needed.  Please start taking Duloxetine 60 mg once daily for neuropathy and anxiety.  Please start taking Ciprofloxacin for ear infection.   Please continue to follow low carb diet and perform moderate exercise/walking at least 150 mins/week.

## 2023-05-08 NOTE — Assessment & Plan Note (Signed)

## 2023-05-08 NOTE — Assessment & Plan Note (Signed)
Flowsheet Row Office Visit from 05/08/2023 in [facility]  PHQ-9 Total Score 12      Uncontrolled Restart Cymbalta 60 mg daily

## 2023-05-08 NOTE — Assessment & Plan Note (Signed)
Lab Results  Component Value Date   HGBA1C 10.1 (H) 01/18/2023   Uncontrolled, recently increased dose of Lantus to 40 units nightly - now improving although she has not brought blood glucose log or meter On Metformin, Jardiance and Lantus 40 U, followed by endocrinology Advised to follow diabetic diet On statin and ACEi Diabetic eye exam: Advised to follow up with Ophthalmology for diabetic eye exam  Restart Cymbalta for neuropathy

## 2023-05-08 NOTE — Progress Notes (Signed)
Established Patient Office Visit  Subjective:  Patient ID: Sonya James, female    DOB: 05-Jul-1966  Age: 57 y.o. MRN: [id number]  CC:  Chief Complaint  Patient presents with   Annual Exam    HPI [name] is a 57 y.o. female with past medical history of HTN, type II DM, HLD, COPD, OSA, PE, GERD, obesity and tobacco abuse who presents for annual physical.  HTN: Her BP is elevated today. She still takes amlodipine 10 mg QD, spironolactone 50 mg QD and labetalol 100 mg QAM and 50 mg QPM, but compliance is questionable. She denies any headache, chest pain or palpitations.  Type II DM with HLD: Her HbA1c had increased to 10.1 in 05/24.  She is on Lantus 40 units, Metformin 1000 mg BID and Jardiance 25 mg QD.  She had CGM, but has not refilled it. She takes Lipitor for HLD.  She complains of dyspnea and wheezing intermittently, and has been using albuterol inhaler for her COPD, but needs refill of it.  Denies any hemoptysis.  MDD: She reports anxiety, apathy, crying spells and decreased concentration, which is chronic.  She has moved out of her mother's home as she was stressing her out.  She gets anxious when her  mother tries to call her. She has not been refilling Cymbalta. Denies any SI or HI currently.  Chondritis of left ear: She has history of chondritis of left ear, which was excised in 2022.  She has recurrence of left ear pain and discharge for the last 2 weeks.  She has noticed greenish discharge from left ear.  Denies any fever or chills.  She has chronic hearing problem as well.  Past Medical History:  Diagnosis Date   Anemia    Anxiety    Arthritis    Asthma 05/25/2013   Autonomic neuropathy    Cancer of endometrium (HCC) 10/18/2015   Cellulitis, face 05/03/2021   Constipation    COPD (chronic obstructive pulmonary disease) (HCC)    CTS (carpal tunnel syndrome)    Depression    Diabetes mellitus without complication (HCC)    Dysphagia 12/14/2016   Encounter for  screening colonoscopy    Endometrial cancer (HCC) 2017   Gastritis without bleeding    GERD (gastroesophageal reflux disease)    Gross hematuria 05/11/2019   Headache(784.0)    Heavy menses 10/11/2015   HTN (hypertension)    Hypercholesterolemia    Hypothyroidism    IBS (irritable bowel syndrome)    Osteoarthritis    osteoarthritis   Pancreatitis    per patient    Recurrent boils    buttocks and low back.   S/P laparoscopic hysterectomy 11/04/2015   Shortness of breath    Sleep apnea    CPAP machine   Uncontrolled type 2 diabetes mellitus with diabetic polyneuropathy, with long-term current use of insulin 08/04/2017   Uterine cancer (HCC) 2017   Vitamin D deficiency     Past Surgical History:  Procedure Laterality Date   ABDOMINAL HYSTERECTOMY  2018   endometrial cancer, surgery done at [facility]    BIOPSY  01/02/2017   Procedure: BIOPSY;  Surgeon: West Bali, MD;  Location: [facility];  Service: Endoscopy;;  gastric biopsy   CESAREAN SECTION  1989   COLONOSCOPY WITH PROPOFOL N/A 01/02/2017   Dr. Darrick Penna: redundant colon, two 2-3 mm polyps in sigmoid colon (hyperplastic)   EAR CYST EXCISION Right 11/29/2021   Procedure: EXCISION OF EXTERNAL EAR LESION;  Surgeon: Laren Boom, DO;  Location: [facility] OR;  Service: ENT;  Laterality: Right;   ESOPHAGOGASTRODUODENOSCOPY  05/22/2011   Dr. Darrick Penna: H.pylori gastritis    ESOPHAGOGASTRODUODENOSCOPY (EGD) WITH PROPOFOL N/A 01/02/2017   Dr. Darrick Penna: possible web in proximal esophagus s/p dilation, moderate gastritis (negative H.pylori)   FOOT SURGERY Left    tendon repair   IR US GUIDE VASC ACCESS LEFT  01/18/2021   OOPHORECTOMY     OTOPLASATY Left 06/15/2021   Procedure: Excision of left auricular lesion with endaural meatoplasty;  Surgeon: Laren Boom, DO;  Location: [facility] OR;  Service: ENT;  Laterality: Left;   POLYPECTOMY  01/02/2017   Procedure: POLYPECTOMY;  Surgeon: West Bali, MD;  Location: [facility];   Service: Endoscopy;;  sigmoid colon polyps times 2   REDUCTION MAMMAPLASTY  1996   SAVORY DILATION N/A 01/02/2017   Procedure: SAVORY DILATION;  Surgeon: West Bali, MD;  Location: [facility];  Service: Endoscopy;  Laterality: N/A;   TUBAL LIGATION      Family History  Problem Relation Age of Onset   Diabetes Mother    Hypertension Mother    COPD Mother    Asthma Mother    Hypercholesterolemia Mother    Hypertension Sister    Hyperlipidemia Sister    Diabetes Sister    Asthma Brother    Alcohol abuse Brother    Asthma Son    Cancer Maternal Grandmother        lung, throat, breast   Alzheimer's disease Maternal Grandmother    Hypertension Maternal Grandmother    Hypercholesterolemia Maternal Grandmother    Heart disease Maternal Grandfather    Stroke Maternal Grandfather    Clotting disorder Maternal Grandfather        blood clots in legs   Hypertension Sister    Hyperlipidemia Sister    Stroke Maternal Aunt    Clotting disorder Maternal Aunt        blood clots in legs   Hypertension Maternal Aunt    Hypercholesterolemia Maternal Aunt    Hypertension Maternal Aunt    Asthma Maternal Aunt    Clotting disorder Maternal Uncle        blood clot -legs traveled to heart and he died of heart attack   Colon cancer Neg Hx     Social History   Socioeconomic History   Marital status: Married    Spouse name: Not on file   Number of children: 1   Years of education: Not on file   Highest education level: Not on file  Occupational History   Not on file  Tobacco Use   Smoking status: Every Day    Current packs/day: 1.50    Average packs/day: 1.5 packs/day for 22.0 years (33.0 ttl pk-yrs)    Types: Cigarettes   Smokeless tobacco: Never  Vaping Use   Vaping status: Never Used  Substance and Sexual Activity   Alcohol use: No   Drug use: No   Sexual activity: Not Currently    Birth control/protection: Surgical    Comment: hysterectomy  Other Topics Concern   Not  on file  Social History Narrative   Lives with husband and she has been married to for 9 years.  Recently got evicted and has been living in a motel since last July.   She has 1 son that lives in [place].  Reports one grandbaby      Enjoys watching TV mostly animal [place].  Diet: Eats all food groups   Caffeine: Soda not diet, green tea, coffee about 3 cups of caffeine per day.   Water: Drinks 5-6 8 ounce bottles throughout the day.      Wears a seatbelt.  Currently does not have a vehicle.  Smoke detectors in the facility she is living in.  Does not have any weapons.   Social Determinants of Health   Financial Resource Strain: Patient Declined (10/09/2022)   Received from [facility], [facility]   Overall Financial Resource Strain (CARDIA)    Difficulty of Paying Living Expenses: Patient declined  Food Insecurity: Patient Declined (10/09/2022)   Received from [facility], [facility]   Hunger Vital Sign    Worried About Running Out of Food in the Last Year: Patient declined    Ran Out of Food in the Last Year: Patient declined  Transportation Needs: Patient Declined (10/09/2022)   Received from [facility], [facility]   PRAPARE - Transportation    Lack of Transportation (Medical): Patient declined    Lack of Transportation (Non-Medical): Patient declined  Physical Activity: Insufficiently Active (04/06/2022)   Exercise Vital Sign    Days of Exercise per Week: 1 day    Minutes of Exercise per Session: 30 min  Stress: Stress Concern Present (04/06/2022)   [employer] Occupational [employer] - Occupational Stress Questionnaire    Feeling of Stress : Rather much  Social Connections: Unknown (10/04/2022)   Received from [facility], [facility]   Social Network    Social Network: Not on file  Intimate Partner Violence: Unknown (10/04/2022)   Received from [facility], [facility]   HITS    Physically Hurt: Not on file    Insult or Talk Down To: Not  on file    Threaten Physical Harm: Not on file    Scream or Curse: Not on file    Outpatient Medications Prior to Visit  Medication Sig Dispense Refill   Accu-Chek Softclix Lancets lancets To test glucose 4 times a day 150 each 2   amLODipine (NORVASC) 10 MG tablet Take 1 tablet (10 mg total) by mouth daily. 90 tablet 1   apixaban (ELIQUIS) 5 MG TABS tablet Take 1 tablet (5 mg total) by mouth 2 (two) times daily. On 08/12/17 @ 9PM, start 1 tab (5 mg) two times daily. 60 tablet 5   atorvastatin (LIPITOR) 80 MG tablet Take 1 tablet (80 mg total) by mouth daily. 90 tablet 3   Blood Glucose Monitoring Suppl (ACCU-CHEK GUIDE ME) w/Device KIT 1 Piece by Does not apply route as directed. 1 kit 0   Blood Glucose Monitoring Suppl DEVI 1 each by Does not apply route in the morning, at noon, and at bedtime. May substitute to any manufacturer covered by patient's insurance. 1 each 0   clotrimazole (GYNE-LOTRIMIN 3) 2 % vaginal cream Place 1 Applicatorful vaginally at bedtime. 21 g 0   Continuous Blood Gluc Sensor (DEXCOM G7 SENSOR) MISC 1 Device by Does not apply route as directed. 9 each 3   empagliflozin (JARDIANCE) 25 MG TABS tablet Take 1 tablet (25 mg total) by mouth daily before breakfast. 90 tablet 3   esomeprazole (NEXIUM) 40 MG capsule TAKE 1 CAPSULE BY MOUTH TWICE DAILY BEFORE MEAL(S) 180 capsule 3   furosemide (LASIX) 40 MG tablet Take 1 tablet by mouth once daily 90 tablet 0   glucose blood (ACCU-CHEK GUIDE) test strip Test glucose 4 times a day 150 each 2   insulin  glargine (LANTUS SOLOSTAR) 100 UNIT/ML Solostar Pen Inject 34 Units into the skin daily. 30 mL 6   Insulin Pen Needle 31G X 5 MM MISC 1 Device by Does not apply route daily in the afternoon. 100 each 2   linaclotide (LINZESS) 290 MCG CAPS capsule TAKE 1 CAPSULE BY MOUTH ONCE DAILY BEFORE BREAKFAST 90 capsule 3   metFORMIN (GLUCOPHAGE) 1000 MG tablet Take 1 tablet (1,000 mg total) by mouth 2 (two) times daily with a meal. 90 tablet 3    Misc. Devices MISC Blood pressure monitoring device 1 each 0   ondansetron (ZOFRAN-ODT) 4 MG disintegrating tablet Take 1 tablet (4 mg total) by mouth every 6 (six) hours as needed for nausea or vomiting. 30 tablet 2   rizatriptan (MAXALT) 10 MG tablet Take 10 mg by mouth daily as needed for migraine.     spironolactone (ALDACTONE) 50 MG tablet Take 1 tablet (50 mg total) by mouth daily. 90 tablet 1   Tiotropium Bromide Monohydrate (SPIRIVA RESPIMAT) 2.5 MCG/ACT AERS Inhale 2 puffs into the lungs daily. 12 g 1   Vitamin D, Ergocalciferol, (DRISDOL) 1.25 MG (50000 UNIT) CAPS capsule Take 1 capsule by mouth once a week 12 capsule 1   albuterol (VENTOLIN HFA) 108 (90 Base) MCG/ACT inhaler Inhale 2 puffs into the lungs every 6 (six) hours as needed for wheezing or shortness of breath. 18 g 5   DULoxetine (CYMBALTA) 60 MG capsule Take 1 capsule (60 mg total) by mouth daily. 90 capsule 1   ketoconazole (NIZORAL) 2 % cream Apply 1 Application topically daily. 30 g 0   labetalol (NORMODYNE) 100 MG tablet Take 1 tablet (100 mg total) by mouth in the morning AND 0.5 tablets (50 mg total) every evening. 135 tablet 1   montelukast (SINGULAIR) 10 MG tablet TAKE 1 TABLET BY MOUTH AT BEDTIME 90 tablet 0   oxyCODONE (ROXICODONE) 5 MG immediate release tablet Take 1 tablet (5 mg total) by mouth every 4 (four) hours as needed for breakthrough pain. 10 tablet 0   potassium chloride (KLOR-CON) 10 MEQ tablet TAKE 1 TABLET BY MOUTH ONCE DAILY IN THE EVENING 90 tablet 0   promethazine-dextromethorphan (PROMETHAZINE-DM) 6.25-15 MG/5ML syrup Take 5 mLs by mouth 4 (four) times daily as needed for cough. 118 mL 0   No facility-administered medications prior to visit.    Allergies  Allergen Reactions   Ibuprofen     dizziness   Lisinopril Swelling    Swelling in face    ROS Review of Systems  Constitutional:  Negative for chills and fever.  HENT:  Positive for ear discharge and ear pain. Negative for congestion,  sinus pressure, sinus pain and sore throat.   Eyes:  Negative for pain and discharge.  Respiratory:  Negative for cough, shortness of breath and wheezing.   Cardiovascular:  Negative for chest pain and palpitations.  Gastrointestinal:  Negative for abdominal pain, diarrhea, nausea and vomiting.  Endocrine: Negative for polydipsia and polyuria.       Hot flashes  Genitourinary:  Negative for dysuria and hematuria.  Musculoskeletal:  Positive for arthralgias and back pain. Negative for neck pain and neck stiffness.  Skin:  Negative for rash.  Neurological:  Negative for dizziness and weakness.  Psychiatric/Behavioral:  Negative for agitation and behavioral problems.       Objective:    Physical Exam Vitals reviewed.  Constitutional:      General: She is not in acute distress.    Appearance: She is obese.  She is not diaphoretic.  HENT:     Head: Normocephalic and atraumatic.     Left Ear: Decreased hearing noted. Drainage and tenderness present.     Ears:     Comments: Unable to examine left ear further due to narrow canal and tenderness    Nose: Nose normal. No congestion.     Mouth/Throat:     Mouth: Mucous membranes are moist.     Pharynx: No posterior oropharyngeal erythema.  Eyes:     General: No scleral icterus.    Extraocular Movements: Extraocular movements intact.  Neck:     Vascular: No carotid bruit.  Cardiovascular:     Rate and Rhythm: Normal rate and regular rhythm.     Pulses: Normal pulses.     Heart sounds: Murmur (Systolic over upper sternal border) heard.  Pulmonary:     Breath sounds: Normal breath sounds. No rales.  Abdominal:     Palpations: Abdomen is soft.     Tenderness: There is no abdominal tenderness.  Musculoskeletal:     Cervical back: Neck supple. No tenderness.     Right lower leg: No edema.     Left lower leg: No edema.  Skin:    General: Skin is warm.     Findings: No rash.  Neurological:     General: No focal deficit present.      Mental Status: She is alert and oriented to person, place, and time.     Sensory: No sensory deficit.     Motor: No weakness.  Psychiatric:        Mood and Affect: Mood is depressed. Affect is tearful.        Behavior: Behavior normal.     BP (!) 150/78 (BP Location: Left Arm)   Pulse 68   Ht 5\' 5"  (1.651 m)   Wt 222 lb 9.6 oz (101 kg)   LMP 09/20/2015 (Exact Date)   SpO2 96%   BMI 37.04 kg/m  Wt Readings from Last 3 Encounters:  05/08/23 222 lb 9.6 oz (101 kg)  02/21/23 222 lb 3.2 oz (100.8 kg)  01/18/23 222 lb 12.8 oz (101.1 kg)    Lab Results  Component Value Date   TSH 1.800 03/06/2022   Lab Results  Component Value Date   WBC 8.0 02/21/2023   HGB 14.7 02/21/2023   HCT 46.0 02/21/2023   MCV 94.5 02/21/2023   PLT 324 02/21/2023   Lab Results  Component Value Date   NA 137 02/21/2023   K 3.9 02/21/2023   CO2 26 02/21/2023   GLUCOSE 234 (H) 02/21/2023   BUN 16 02/21/2023   CREATININE 0.75 02/21/2023   BILITOT 0.4 02/21/2023   ALKPHOS 104 02/21/2023   AST 12 (L) 02/21/2023   ALT 13 02/21/2023   PROT 7.7 02/21/2023   ALBUMIN 3.6 02/21/2023   CALCIUM 9.5 02/21/2023   ANIONGAP 8 02/21/2023   EGFR 101 01/18/2023   Lab Results  Component Value Date   CHOL 194 01/18/2023   Lab Results  Component Value Date   HDL 34 (L) 01/18/2023   Lab Results  Component Value Date   LDLCALC 144 (H) 01/18/2023   Lab Results  Component Value Date   TRIG 87 01/18/2023   Lab Results  Component Value Date   CHOLHDL 5.7 (H) 01/18/2023   Lab Results  Component Value Date   HGBA1C 10.1 (H) 01/18/2023      Assessment & Plan:   Problem List Items Addressed This Visit  Cardiovascular and Mediastinum   Essential hypertension, benign    BP Readings from Last 1 Encounters:  05/08/23 (!) 150/78   Uncontrolled with Amlodipine 10 mg QD, Spironolactone 50 mg QD and Labetalol 100 mg QAM and 50 mg QPM Increased dose of labetalol to 100 mg twice daily Counseled  for compliance with the medications Advised DASH diet and moderate exercise/walking, at least 150 mins/week      Relevant Medications   labetalol (NORMODYNE) 100 MG tablet   Other Relevant Orders   TSH   CMP14+EGFR   CBC with Differential/Platelet     Respiratory   Chronic obstructive pulmonary disease (HCC)    Well-controlled with Albuterol inhaler PRN Recently has had dyspnea/wheezing in the setting of URTI      Relevant Medications   albuterol (VENTOLIN HFA) 108 (90 Base) MCG/ACT inhaler     Endocrine   Uncontrolled type 2 diabetes mellitus with hyperglycemia, with long-term current use of insulin (HCC)    Lab Results  Component Value Date   HGBA1C 10.1 (H) 01/18/2023   Uncontrolled On Metformin, Jardiance and Levemir 40 U QD, followed by endocrinology - needs follow up Advised to follow diabetic diet On statin and ACEi Diabetic eye exam: Advised to follow up with Ophthalmology for diabetic eye exam      Relevant Orders   Hemoglobin A1c   CMP14+EGFR   Urine Microalbumin w/creat. ratio   Type 2 diabetes mellitus with diabetic polyneuropathy, with long-term current use of insulin (HCC)    Lab Results  Component Value Date   HGBA1C 10.1 (H) 01/18/2023   Uncontrolled, recently increased dose of Lantus to 40 units nightly - now improving although she has not brought blood glucose log or meter On Metformin, Jardiance and Lantus 40 U, followed by endocrinology Advised to follow diabetic diet On statin and ACEi Diabetic eye exam: Advised to follow up with Ophthalmology for diabetic eye exam  Restart Cymbalta for neuropathy      Relevant Medications   DULoxetine (CYMBALTA) 60 MG capsule   Other Relevant Orders   TSH     Nervous and Auditory   Chondritis of external ear, left    S/p excision of left auricular lesion with meatoplasty in 2022 Followed by ENT - has follow up visit in the next month Since she currently has recurrence of left ear greenish drainage,  started empiric ciprofloxacin      Relevant Medications   ciprofloxacin (CIPRO) 500 MG tablet     Other   Morbid obesity (HCC)    BMI Readings from Last 3 Encounters:  05/08/23 37.04 kg/m  02/21/23 36.98 kg/m  01/18/23 37.08 kg/m   Associated with HTN, type II DM and HLD Has lost >100 lbs with diet modification in the past Had referred to bariatric surgery in the past      Mixed hyperlipidemia    Lipid profile reviewed Last cardiology note reviewed, referred to lipid clinic Currently on statin, plan to start PCSK9 inhibitor therapy      Relevant Medications   labetalol (NORMODYNE) 100 MG tablet   Other Relevant Orders   Lipid panel   Encounter for general adult medical examination with abnormal findings - Primary    Physical exam as documented. Counseling done  re healthy lifestyle involving commitment to 150 minutes exercise per week, heart healthy diet, and attaining healthy weight.The importance of adequate sleep also discussed. Changes in health habits are decided on by the patient with goals and time frames  set for achieving  them. Immunization and cancer screening needs are specifically addressed at this visit.      Moderate episode of recurrent major depressive disorder (HCC)    Flowsheet Row Office Visit from 05/08/2023 in [facility]  PHQ-9 Total Score 12      Uncontrolled Restart Cymbalta 60 mg daily      Relevant Medications   DULoxetine (CYMBALTA) 60 MG capsule   Other Visit Diagnoses     Encounter for immunization       Relevant Orders   Flu vaccine trivalent PF, 6mos and older(Flulaval,Afluria,Fluarix,Fluzone) (Completed)       Meds ordered this encounter  Medications   albuterol (VENTOLIN HFA) 108 (90 Base) MCG/ACT inhaler    Sig: Inhale 2 puffs into the lungs every 6 (six) hours as needed for wheezing or shortness of breath.    Dispense:  18 g    Refill:  5   labetalol (NORMODYNE) 100 MG tablet    Sig: Take 1  tablet (100 mg total) by mouth 2 (two) times daily.    Dispense:  180 tablet    Refill:  1   DULoxetine (CYMBALTA) 60 MG capsule    Sig: Take 1 capsule (60 mg total) by mouth daily.    Dispense:  90 capsule    Refill:  1   ciprofloxacin (CIPRO) 500 MG tablet    Sig: Take 1 tablet (500 mg total) by mouth 2 (two) times daily for 5 days.    Dispense:  10 tablet    Refill:  0    Follow-up: Return in about 3 months (around 08/08/2023) for DM and HTN.    Anabel Halon, MD

## 2023-05-08 NOTE — Assessment & Plan Note (Signed)
Lipid profile reviewed Last cardiology note reviewed, referred to lipid clinic Currently on statin, plan to start PCSK9 inhibitor therapy 

## 2023-05-08 NOTE — Assessment & Plan Note (Signed)
BMI Readings from Last 3 Encounters:  05/08/23 37.04 kg/m  02/21/23 36.98 kg/m  01/18/23 37.08 kg/m   Associated with HTN, type II DM and HLD Has lost >100 lbs with diet modification in the past Had referred to bariatric surgery in the past

## 2023-05-08 NOTE — Assessment & Plan Note (Signed)
Well-controlled with Albuterol inhaler PRN Recently has had dyspnea/wheezing in the setting of URTI 

## 2023-05-08 NOTE — Assessment & Plan Note (Signed)
Lab Results  Component Value Date   HGBA1C 10.1 (H) 01/18/2023   Uncontrolled On Metformin, Jardiance and Levemir 40 U QD, followed by endocrinology - needs follow up Advised to follow diabetic diet On statin and ACEi Diabetic eye exam: Advised to follow up with Ophthalmology for diabetic eye exam

## 2023-05-08 NOTE — Assessment & Plan Note (Signed)
BP Readings from Last 1 Encounters:  05/08/23 (!) 150/78   Uncontrolled with Amlodipine 10 mg QD, Spironolactone 50 mg QD and Labetalol 100 mg QAM and 50 mg QPM Increased dose of labetalol to 100 mg twice daily Counseled for compliance with the medications Advised DASH diet and moderate exercise/walking, at least 150 mins/week

## 2023-05-08 NOTE — Assessment & Plan Note (Signed)
S/p excision of left auricular lesion with meatoplasty in 2022 Followed by ENT - has follow up visit in the next month Since she currently has recurrence of left ear greenish drainage, started empiric ciprofloxacin

## 2023-05-09 ENCOUNTER — Telehealth: Payer: Self-pay | Admitting: *Deleted

## 2023-05-09 ENCOUNTER — Other Ambulatory Visit: Payer: Self-pay | Admitting: Internal Medicine

## 2023-05-09 DIAGNOSIS — D751 Secondary polycythemia: Secondary | ICD-10-CM

## 2023-05-09 MED ORDER — ATORVASTATIN CALCIUM 80 MG PO TABS
80.0000 mg | ORAL_TABLET | Freq: Every day | ORAL | 3 refills | Status: DC
Start: 2023-05-09 — End: 2023-11-26

## 2023-05-09 NOTE — Addendum Note (Signed)
Addended byTrena Platt on: 05/09/2023 08:03 AM   Modules accepted: Orders

## 2023-05-09 NOTE — Telephone Encounter (Signed)
Patient called and left a VM stating that she was advised to contact us regarding her lab results. We see her for DVT, however her hgb and hct were elevated. Spoke to Dr. Allena Katz about placing a new referral for her so that we can address as a new problem.  He will put in referral.  Patient contacted and aware that we will call her to schedule once it is received.

## 2023-05-10 LAB — CBC WITH DIFFERENTIAL/PLATELET
Basophils Absolute: 0.1 10*3/uL (ref 0.0–0.2)
Basos: 1 %
EOS (ABSOLUTE): 0.3 10*3/uL (ref 0.0–0.4)
Eos: 4 %
Hematocrit: 48 % — ABNORMAL HIGH (ref 34.0–46.6)
Hemoglobin: 16.4 g/dL — ABNORMAL HIGH (ref 11.1–15.9)
Immature Grans (Abs): 0.1 10*3/uL (ref 0.0–0.1)
Immature Granulocytes: 1 %
Lymphocytes Absolute: 3.1 10*3/uL (ref 0.7–3.1)
Lymphs: 35 %
MCH: 30.9 pg (ref 26.6–33.0)
MCHC: 34.2 g/dL (ref 31.5–35.7)
MCV: 91 fL (ref 79–97)
Monocytes Absolute: 0.5 10*3/uL (ref 0.1–0.9)
Monocytes: 6 %
Neutrophils Absolute: 4.9 10*3/uL (ref 1.4–7.0)
Neutrophils: 53 %
Platelets: 366 10*3/uL (ref 150–450)
RBC: 5.3 x10E6/uL — ABNORMAL HIGH (ref 3.77–5.28)
RDW: 11.9 % (ref 11.7–15.4)
WBC: 8.9 10*3/uL (ref 3.4–10.8)

## 2023-05-10 LAB — CMP14+EGFR
ALT: 14 IU/L (ref 0–32)
AST: 12 IU/L (ref 0–40)
Albumin: 4.4 g/dL (ref 3.8–4.9)
Alkaline Phosphatase: 138 IU/L — ABNORMAL HIGH (ref 44–121)
BUN/Creatinine Ratio: 11 (ref 9–23)
BUN: 8 mg/dL (ref 6–24)
Bilirubin Total: 0.2 mg/dL (ref 0.0–1.2)
CO2: 22 mmol/L (ref 20–29)
Calcium: 10.6 mg/dL — ABNORMAL HIGH (ref 8.7–10.2)
Chloride: 103 mmol/L (ref 96–106)
Creatinine, Ser: 0.72 mg/dL (ref 0.57–1.00)
Globulin, Total: 3.1 g/dL (ref 1.5–4.5)
Glucose: 144 mg/dL — ABNORMAL HIGH (ref 70–99)
Potassium: 4.3 mmol/L (ref 3.5–5.2)
Sodium: 141 mmol/L (ref 134–144)
Total Protein: 7.5 g/dL (ref 6.0–8.5)
eGFR: 97 mL/min/{1.73_m2} (ref >59)

## 2023-05-10 LAB — LIPID PANEL
Chol/HDL Ratio: 5.1 ratio — ABNORMAL HIGH (ref 0.0–4.4)
Cholesterol, Total: 252 mg/dL — ABNORMAL HIGH (ref 100–199)
HDL: 49 mg/dL (ref >39)
LDL Chol Calc (NIH): 189 mg/dL — ABNORMAL HIGH (ref 0–99)
Triglycerides: 84 mg/dL (ref 0–149)
VLDL Cholesterol Cal: 14 mg/dL (ref 5–40)

## 2023-05-10 LAB — TSH: TSH: 1.67 u[IU]/mL (ref 0.450–4.500)

## 2023-05-10 LAB — HEMOGLOBIN A1C
Est. average glucose Bld gHb Est-mCnc: 194 mg/dL
Hgb A1c MFr Bld: 8.4 % — ABNORMAL HIGH (ref 4.8–5.6)

## 2023-05-10 LAB — MICROALBUMIN / CREATININE URINE RATIO
Creatinine, Urine: 66.8 mg/dL
Microalb/Creat Ratio: 41 mg/g creat — ABNORMAL HIGH (ref 0–29)
Microalbumin, Urine: 27.5 ug/mL

## 2023-05-23 DIAGNOSIS — H93292 Other abnormal auditory perceptions, left ear: Secondary | ICD-10-CM | POA: Diagnosis not present

## 2023-05-28 NOTE — Progress Notes (Unsigned)
Name: Sonya James  MRN/ DOB: [id number], 10-Jan-1966   Age/ Sex: 57 y.o., female    PCP: Anabel Halon, MD   Reason for Endocrinology Evaluation: Type 2 Diabetes Mellitus     Date of Initial Endocrinology Visit: 09/02/2022    PATIENT IDENTIFIER: Sonya James is a 57 y.o. female with a past medical history of T2DM, HTN, Hx of endometrial cancer and Hx of Pancreatitis and dyslipidemia . The patient presented for initial endocrinology clinic visit on 09/02/2021  for consultative assistance with her diabetes management.    HPI: Sonya James was    Diagnosed with DM 2014 Prior Medications tried/Intolerance: Has been on jardiance without intolerance issues           Hemoglobin A1c has ranged from 5.9% in 2017, peaking at 12.2% in 2022.  She has dysphagia and thyromegaly . She had an ultrasound in 2005 that did not show any nodules at the time . TFT's normal    On her initial visit to our clinic she had an A1c 9.0 % , She was on Glipizide, Novolin Mix , and Metformin . We stopped Glipizide, Novolin Mix, started basal insulin, Jardiance and continued Metformin      SUBJECTIVE:   During the last visit (03/24/2022): A1c 9.3 %     Today (05/29/23): Sonya James is here for a follow up on diabetes management. She has NOT been to our clinic in 14 months.   She checks her  blood sugars 0 times daily. The patient has not had hypoglycemic episodes since the last clinic visit.   Denies nausea or vomiting  Denies diarrhea but has constipation - on linzess     HOME DIABETES REGIMEN: Metformin  1000 mg BID  Jardiance 25 mg daily  Lantus 40 units daily     Statin: yes ACE-I/ARB: yes Prior Diabetic Education: yes - does not like going there    METER DOWNLOAD SUMMARY: did not bring    DIABETIC COMPLICATIONS: Microvascular complications:  Neuropathy Denies: CKD, retinopathy  Last eye exam: Completed 2023  Macrovascular complications:   Denies: CAD, CVA      PAST  HISTORY: Past Medical History:  Past Medical History:  Diagnosis Date   Anemia    Anxiety    Arthritis    Asthma 05/25/2013   Autonomic neuropathy    Cancer of endometrium (HCC) 10/18/2015   Cellulitis, face 05/03/2021   Constipation    COPD (chronic obstructive pulmonary disease) (HCC)    CTS (carpal tunnel syndrome)    Depression    Diabetes mellitus without complication (HCC)    Dysphagia 12/14/2016   Encounter for screening colonoscopy    Endometrial cancer (HCC) 2017   Gastritis without bleeding    GERD (gastroesophageal reflux disease)    Gross hematuria 05/11/2019   Headache(784.0)    Heavy menses 10/11/2015   HTN (hypertension)    Hypercholesterolemia    Hypothyroidism    IBS (irritable bowel syndrome)    Osteoarthritis    osteoarthritis   Pancreatitis    per patient    Recurrent boils    buttocks and low back.   S/P laparoscopic hysterectomy 11/04/2015   Shortness of breath    Sleep apnea    CPAP machine   Uncontrolled type 2 diabetes mellitus with diabetic polyneuropathy, with long-term current use of insulin 08/04/2017   Uterine cancer (HCC) 2017   Vitamin D deficiency    Past Surgical History:  Past Surgical History:  Procedure Laterality Date  ABDOMINAL HYSTERECTOMY  2018   endometrial cancer, surgery done at [facility]    BIOPSY  01/02/2017   Procedure: BIOPSY;  Surgeon: West Bali, MD;  Location: [facility];  Service: Endoscopy;;  gastric biopsy   CESAREAN SECTION  1989   COLONOSCOPY WITH PROPOFOL N/A 01/02/2017   Dr. Darrick Penna: redundant colon, two 2-3 mm polyps in sigmoid colon (hyperplastic)   EAR CYST EXCISION Right 11/29/2021   Procedure: EXCISION OF EXTERNAL EAR LESION;  Surgeon: Laren Boom, DO;  Location: [facility];  Service: ENT;  Laterality: Right;   ESOPHAGOGASTRODUODENOSCOPY  05/22/2011   Dr. Darrick Penna: H.pylori gastritis    ESOPHAGOGASTRODUODENOSCOPY (EGD) WITH PROPOFOL N/A 01/02/2017   Dr. Darrick Penna: possible web in proximal  esophagus s/p dilation, moderate gastritis (negative H.pylori)   FOOT SURGERY Left    tendon repair   IR US GUIDE VASC ACCESS LEFT  01/18/2021   OOPHORECTOMY     OTOPLASATY Left 06/15/2021   Procedure: Excision of left auricular lesion with endaural meatoplasty;  Surgeon: Laren Boom, DO;  Location: [facility] OR;  Service: ENT;  Laterality: Left;   POLYPECTOMY  01/02/2017   Procedure: POLYPECTOMY;  Surgeon: West Bali, MD;  Location: [facility];  Service: Endoscopy;;  sigmoid colon polyps times 2   REDUCTION MAMMAPLASTY  1996   SAVORY DILATION N/A 01/02/2017   Procedure: SAVORY DILATION;  Surgeon: West Bali, MD;  Location: [facility];  Service: Endoscopy;  Laterality: N/A;   TUBAL LIGATION      Social History:  reports that she has been smoking cigarettes. She has a 33 pack-year smoking history. She has never used smokeless tobacco. She reports that she does not drink alcohol and does not use drugs. Family History:  Family History  Problem Relation Age of Onset   Diabetes Mother    Hypertension Mother    COPD Mother    Asthma Mother    Hypercholesterolemia Mother    Hypertension Sister    Hyperlipidemia Sister    Diabetes Sister    Asthma Brother    Alcohol abuse Brother    Asthma Son    Cancer Maternal Grandmother        lung, throat, breast   Alzheimer's disease Maternal Grandmother    Hypertension Maternal Grandmother    Hypercholesterolemia Maternal Grandmother    Heart disease Maternal Grandfather    Stroke Maternal Grandfather    Clotting disorder Maternal Grandfather        blood clots in legs   Hypertension Sister    Hyperlipidemia Sister    Stroke Maternal Aunt    Clotting disorder Maternal Aunt        blood clots in legs   Hypertension Maternal Aunt    Hypercholesterolemia Maternal Aunt    Hypertension Maternal Aunt    Asthma Maternal Aunt    Clotting disorder Maternal Uncle        blood clot -legs traveled to heart and he died of heart attack    Colon cancer Neg Hx      HOME MEDICATIONS: Allergies as of 05/29/2023       Reactions   Ibuprofen    dizziness   Lisinopril Swelling   Swelling in face        Medication List        Accurate as of May 29, 2023 12:17 PM. If you have any questions, ask your nurse or doctor.          Accu-Chek Guide Me w/Device  Kit 1 Piece by Does not apply route as directed.   Blood Glucose Monitoring Suppl Devi 1 each by Does not apply route in the morning, at noon, and at bedtime. May substitute to any manufacturer covered by patient's insurance.   Accu-Chek Guide test strip Generic drug: glucose blood Test glucose 4 times a day   Accu-Chek Softclix Lancets lancets To test glucose 4 times a day   albuterol 108 (90 Base) MCG/ACT inhaler Commonly known as: VENTOLIN HFA Inhale 2 puffs into the lungs every 6 (six) hours as needed for wheezing or shortness of breath.   amLODipine 10 MG tablet Commonly known as: NORVASC Take 1 tablet (10 mg total) by mouth daily.   apixaban 5 MG Tabs tablet Commonly known as: ELIQUIS Take 1 tablet (5 mg total) by mouth 2 (two) times daily. On 08/12/17 @ 9PM, start 1 tab (5 mg) two times daily.   atorvastatin 80 MG tablet Commonly known as: LIPITOR Take 1 tablet (80 mg total) by mouth daily.   clotrimazole 2 % vaginal cream Commonly known as: GYNE-LOTRIMIN 3 Place 1 Applicatorful vaginally at bedtime.   Dexcom G7 Sensor Misc 1 Device by Does not apply route as directed.   DULoxetine 60 MG capsule Commonly known as: CYMBALTA Take 1 capsule (60 mg total) by mouth daily.   empagliflozin 25 MG Tabs tablet Commonly known as: Jardiance Take 1 tablet (25 mg total) by mouth daily before breakfast.   esomeprazole 40 MG capsule Commonly known as: NEXIUM TAKE 1 CAPSULE BY MOUTH TWICE DAILY BEFORE MEAL(S)   furosemide 40 MG tablet Commonly known as: LASIX Take 1 tablet by mouth once daily   Insulin Pen Needle 31G X 5 MM Misc 1 Device  by Does not apply route daily in the afternoon.   labetalol 100 MG tablet Commonly known as: NORMODYNE Take 1 tablet (100 mg total) by mouth 2 (two) times daily.   Lantus SoloStar 100 UNIT/ML Solostar Pen Generic drug: insulin glargine Inject 34 Units into the skin daily.   linaclotide 290 MCG Caps capsule Commonly known as: Linzess TAKE 1 CAPSULE BY MOUTH ONCE DAILY BEFORE BREAKFAST   metFORMIN 1000 MG tablet Commonly known as: GLUCOPHAGE Take 1 tablet (1,000 mg total) by mouth 2 (two) times daily with a meal.   Misc. Devices Misc Blood pressure monitoring device   ondansetron 4 MG disintegrating tablet Commonly known as: ZOFRAN-ODT Take 1 tablet (4 mg total) by mouth every 6 (six) hours as needed for nausea or vomiting.   rizatriptan 10 MG tablet Commonly known as: MAXALT Take 10 mg by mouth daily as needed for migraine.   Spiriva Respimat 2.5 MCG/ACT Aers Generic drug: Tiotropium Bromide Monohydrate Inhale 2 puffs into the lungs daily.   spironolactone 50 MG tablet Commonly known as: ALDACTONE Take 1 tablet (50 mg total) by mouth daily.   Vitamin D (Ergocalciferol) 1.25 MG (50000 UNIT) Caps capsule Commonly known as: DRISDOL Take 1 capsule by mouth once a week         ALLERGIES: Allergies  Allergen Reactions   Ibuprofen     dizziness   Lisinopril Swelling    Swelling in face        OBJECTIVE:   VITAL SIGNS: BP 138/82 (BP Location: Left Arm, Patient Position: Sitting, Cuff Size: Large)   Pulse 60   Ht 5\' 5"  (1.651 m)   Wt 227 lb (103 kg)   LMP 09/20/2015 (Exact Date)   SpO2 99%   BMI 37.77 kg/m  PHYSICAL EXAM:  General: Pt appears well and is in NAD  Lungs: Clear with good BS bilat   Heart: RRR   Extremities:  Lower extremities - No pretibial edema. No lesions.  Neuro: MS is good with appropriate affect, pt is alert and Ox3   DM Foot Exam 05/08/2023 per PCP    DATA REVIEWED:  Lab Results  Component Value Date   HGBA1C 8.4 (H)  05/08/2023   HGBA1C 10.1 (H) 01/18/2023   HGBA1C 9.5 (A) 10/09/2022    Latest Reference Range & Units 05/08/23 14:47  Sodium 134 - 144 mmol/L 141  Potassium 3.5 - 5.2 mmol/L 4.3  Chloride 96 - 106 mmol/L 103  CO2 20 - 29 mmol/L 22  Glucose 70 - 99 mg/dL 237 (H)  BUN 6 - 24 mg/dL 8  Creatinine 6.28 - 3.15 mg/dL 1.76  Calcium 8.7 - 16.0 mg/dL 73.7 (H)  BUN/Creatinine Ratio 9 - 23  11  eGFR >59 mL/min/1.73 97  Alkaline Phosphatase 44 - 121 IU/L 138 (H)  Albumin 3.8 - 4.9 g/dL 4.4  AST 0 - 40 IU/L 12  ALT 0 - 32 IU/L 14  Total Protein 6.0 - 8.5 g/dL 7.5  Total Bilirubin 0.0 - 1.2 mg/dL 0.2    Latest Reference Range & Units 05/08/23 14:47  Total CHOL/HDL Ratio 0.0 - 4.4 ratio 5.1 (H)  Cholesterol, Total 100 - 199 mg/dL 106 (H)  HDL Cholesterol >39 mg/dL 49  MICROALB/CREAT RATIO 0 - 29 mg/g creat 41 (H)  Triglycerides 0 - 149 mg/dL 84  VLDL Cholesterol Cal 5 - 40 mg/dL 14  LDL Chol Calc (NIH) 0 - 99 mg/dL 269 (H)      Thyroid Ultrasound 09/19/2021  Estimated total number of nodules >/= 1 cm: 0   Number of spongiform nodules >/=  2 cm not described below (TR1): 0   Number of mixed cystic and solid nodules >/= 1.5 cm not described below (TR2): 0   _________________________________________________________   Few scattered less than 5 mm nodules do not meet criteria for imaging surveillance.   IMPRESSION: No significant sonographic abnormality of the thyroid.     Old records , labs and images have been reviewed.     ASSESSMENT / PLAN / RECOMMENDATIONS:   1) Type 2 Diabetes Mellitus, Poorly controlled, With neuropathic  complications - Most recent A1c of 8.4 %. Goal A1c < 7.0 %.     -Patient continues with hyperglycemia, she has not been to our office in 14 months -She is NOT a candidate for GLP-1 agonist and DPP-4 inhibitors due to hx of pancreatitis  -She was advised to avoid sugar sweetened beverages -She was retrained on using Dexcom -I will increase her  insulin as below   MEDICATIONS: Continue Metformin 1000 mg Twice d ay  Continue Jardiance 25 mg daily  Increase Lantus 46 units daily  EDUCATION / INSTRUCTIONS: BG monitoring instructions: Patient is instructed to check her blood sugars 1 times a day, fasting . Call [place] [facility] if: BG persistently < 70  I reviewed the Rule of 15 for the treatment of hypoglycemia in detail with the patient. Literature supplied.   2) Diabetic complications:  Eye: Does not have known diabetic retinopathy.  Neuro/ Feet: Does have known diabetic peripheral neuropathy. Renal: Patient does not have known baseline CKD. She is not on an ACEI/ARB at present.     Follow-up in 6 months   Signed electronically by: Lyndle Herrlich, MD  [facility]  [facility] [address] [place], [place] [address] Phone: [phone] FAX: [phone]   CC: Anabel Halon, MD [address] [place] [address] Phone: [phone]  Fax: [phone]    Return to Endocrinology clinic as below: Future Appointments  Date Time Provider Department Center  05/30/2023  8:00 AM Darreld Mclean, MD OCR[facility]OCR None  06/11/2023  8:15 AM Doreatha Massed, MD [facility] None  08/13/2023  1:40 PM Anabel Halon, MD [facility]  11/13/2023 10:30 AM Gelene Mink, NP [facility]  02/19/2024 10:45 AM [facility] None  02/26/2024 10:00 AM Carnella Guadalajara, PA-C [facility] None

## 2023-05-29 ENCOUNTER — Ambulatory Visit (INDEPENDENT_AMBULATORY_CARE_PROVIDER_SITE_OTHER): Payer: Medicaid Other | Admitting: Internal Medicine

## 2023-05-29 ENCOUNTER — Encounter: Payer: Self-pay | Admitting: Internal Medicine

## 2023-05-29 VITALS — BP 138/82 | HR 60 | Ht 65.0 in | Wt 227.0 lb

## 2023-05-29 DIAGNOSIS — Z7984 Long term (current) use of oral hypoglycemic drugs: Secondary | ICD-10-CM | POA: Insufficient documentation

## 2023-05-29 DIAGNOSIS — Z794 Long term (current) use of insulin: Secondary | ICD-10-CM

## 2023-05-29 DIAGNOSIS — E1165 Type 2 diabetes mellitus with hyperglycemia: Secondary | ICD-10-CM

## 2023-05-29 DIAGNOSIS — E1169 Type 2 diabetes mellitus with other specified complication: Secondary | ICD-10-CM

## 2023-05-29 LAB — POCT GLUCOSE (DEVICE FOR HOME USE): POC Glucose: 209 mg/dL — AB (ref 70–99)

## 2023-05-29 MED ORDER — LANTUS SOLOSTAR 100 UNIT/ML ~~LOC~~ SOPN: 46.0000 [IU] | PEN_INJECTOR | Freq: Every day | SUBCUTANEOUS | 4 refills | Status: DC

## 2023-05-29 MED ORDER — INSULIN PEN NEEDLE 31G X 5 MM MISC: 1.0000 | Freq: Every day | 3 refills | Status: DC

## 2023-05-29 MED ORDER — METFORMIN HCL 1000 MG PO TABS
1000.0000 mg | ORAL_TABLET | Freq: Two times a day (BID) | ORAL | 3 refills | Status: DC
Start: 2023-05-29 — End: 2023-11-26

## 2023-05-29 MED ORDER — EMPAGLIFLOZIN 25 MG PO TABS: 25.0000 mg | ORAL_TABLET | Freq: Every day | ORAL | 3 refills | Status: DC

## 2023-05-29 MED ORDER — DEXCOM G7 SENSOR MISC
1.0000 | 3 refills | Status: DC
Start: 2023-05-29 — End: 2023-11-26

## 2023-05-29 NOTE — Patient Instructions (Addendum)
Increase Lantus 46 units daily  Continue Metformin 1000 mg Twice d ay  Continue Jardiance 25 mg daily      HOW TO TREAT LOW BLOOD SUGARS (Blood sugar LESS THAN 70 MG/DL) Please follow the RULE OF 15 for the treatment of hypoglycemia treatment (when your (blood sugars are less than 70 mg/dL)   STEP 1: Take 15 grams of carbohydrates when your blood sugar is low, which includes:  3-4 GLUCOSE TABS  OR 3-4 OZ OF JUICE OR REGULAR SODA OR ONE TUBE OF GLUCOSE GEL    STEP 2: RECHECK blood sugar in 15 MINUTES STEP 3: If your blood sugar is still low at the 15 minute recheck --> then, go back to STEP 1 and treat AGAIN with another 15 grams of carbohydrates.

## 2023-05-30 ENCOUNTER — Encounter: Payer: Self-pay | Admitting: Orthopaedic Surgery

## 2023-05-30 ENCOUNTER — Ambulatory Visit: Payer: Medicaid Other | Admitting: Orthopaedic Surgery

## 2023-05-30 VITALS — BP 137/78 | HR 63

## 2023-05-30 DIAGNOSIS — M25561 Pain in right knee: Secondary | ICD-10-CM | POA: Diagnosis not present

## 2023-05-30 DIAGNOSIS — G8929 Other chronic pain: Secondary | ICD-10-CM | POA: Diagnosis not present

## 2023-05-30 DIAGNOSIS — M25512 Pain in left shoulder: Secondary | ICD-10-CM | POA: Diagnosis not present

## 2023-05-30 MED ORDER — METHYLPREDNISOLONE ACETATE 40 MG/ML IJ SUSP
40.0000 mg | Freq: Once | INTRAMUSCULAR | Status: AC
Start: 2023-05-30 — End: 2023-05-30
  Administered 2023-05-30: 40 mg via INTRA_ARTICULAR

## 2023-05-30 NOTE — Progress Notes (Signed)
PROCEDURE NOTE:  The patient request injection, verbal consent was obtained.  The left shoulder was prepped appropriately after time out was performed.   Sterile technique was observed and injection of 1 cc of DepoMedrol 40mg  with several cc's of plain xylocaine. Anesthesia was provided by ethyl chloride and a 20-gauge needle was used to inject the shoulder area. A posterior approach was used.  The injection was tolerated well.  A band aid dressing was applied.  The patient was advised to apply ice later today and tomorrow to the injection sight as needed.   PROCEDURE NOTE:  The patient requests injections of the right knee , verbal consent was obtained.  The right knee was prepped appropriately after time out was performed.   Sterile technique was observed and injection of 1 cc of DepoMedrol 40mg  with several cc's of plain xylocaine. Anesthesia was provided by ethyl chloride and a 20-gauge needle was used to inject the knee area. The injection was tolerated well.  A band aid dressing was applied.  The patient was advised to apply ice later today and tomorrow to the injection sight as needed.  Encounter Diagnoses  Name Primary?   Chronic left shoulder pain Yes   Chronic pain of right knee    Return in six weeks.  Call if any problem.  Precautions discussed.  Electronically Signed Darreld Mclean, MD 9/18/20248:08 AM

## 2023-05-30 NOTE — Addendum Note (Signed)
Addended by: Michaele Offer on: 05/30/2023 08:33 AM   Modules accepted: Orders

## 2023-05-31 DIAGNOSIS — D539 Nutritional anemia, unspecified: Secondary | ICD-10-CM | POA: Diagnosis not present

## 2023-05-31 DIAGNOSIS — M25512 Pain in left shoulder: Secondary | ICD-10-CM | POA: Diagnosis not present

## 2023-05-31 DIAGNOSIS — M25561 Pain in right knee: Secondary | ICD-10-CM | POA: Diagnosis not present

## 2023-05-31 DIAGNOSIS — E7800 Pure hypercholesterolemia, unspecified: Secondary | ICD-10-CM | POA: Diagnosis not present

## 2023-05-31 DIAGNOSIS — M545 Low back pain, unspecified: Secondary | ICD-10-CM | POA: Diagnosis not present

## 2023-05-31 DIAGNOSIS — M25562 Pain in left knee: Secondary | ICD-10-CM | POA: Diagnosis not present

## 2023-05-31 DIAGNOSIS — R0602 Shortness of breath: Secondary | ICD-10-CM | POA: Diagnosis not present

## 2023-05-31 DIAGNOSIS — R5383 Other fatigue: Secondary | ICD-10-CM | POA: Diagnosis not present

## 2023-05-31 DIAGNOSIS — Z1159 Encounter for screening for other viral diseases: Secondary | ICD-10-CM | POA: Diagnosis not present

## 2023-05-31 DIAGNOSIS — Z79891 Long term (current) use of opiate analgesic: Secondary | ICD-10-CM | POA: Diagnosis not present

## 2023-05-31 DIAGNOSIS — E6609 Other obesity due to excess calories: Secondary | ICD-10-CM | POA: Diagnosis not present

## 2023-05-31 DIAGNOSIS — Z131 Encounter for screening for diabetes mellitus: Secondary | ICD-10-CM | POA: Diagnosis not present

## 2023-05-31 DIAGNOSIS — M129 Arthropathy, unspecified: Secondary | ICD-10-CM | POA: Diagnosis not present

## 2023-05-31 DIAGNOSIS — E559 Vitamin D deficiency, unspecified: Secondary | ICD-10-CM | POA: Diagnosis not present

## 2023-06-04 DIAGNOSIS — Z79899 Other long term (current) drug therapy: Secondary | ICD-10-CM | POA: Diagnosis not present

## 2023-06-07 DIAGNOSIS — E119 Type 2 diabetes mellitus without complications: Secondary | ICD-10-CM | POA: Diagnosis not present

## 2023-06-07 DIAGNOSIS — Z79891 Long term (current) use of opiate analgesic: Secondary | ICD-10-CM | POA: Diagnosis not present

## 2023-06-11 ENCOUNTER — Inpatient Hospital Stay: Payer: Medicaid Other | Admitting: Hematology

## 2023-06-11 ENCOUNTER — Inpatient Hospital Stay: Payer: Medicaid Other

## 2023-06-11 ENCOUNTER — Other Ambulatory Visit (HOSPITAL_COMMUNITY): Payer: Self-pay | Admitting: Internal Medicine

## 2023-06-11 DIAGNOSIS — Z1231 Encounter for screening mammogram for malignant neoplasm of breast: Secondary | ICD-10-CM

## 2023-06-11 DIAGNOSIS — Z79899 Other long term (current) drug therapy: Secondary | ICD-10-CM | POA: Diagnosis not present

## 2023-06-19 ENCOUNTER — Encounter: Payer: Self-pay | Admitting: Hematology

## 2023-06-19 ENCOUNTER — Inpatient Hospital Stay: Payer: Medicaid Other | Attending: Physician Assistant | Admitting: Hematology

## 2023-06-19 ENCOUNTER — Inpatient Hospital Stay: Payer: Medicaid Other

## 2023-06-19 VITALS — BP 128/75 | HR 65 | Temp 97.9°F | Resp 16 | Ht 65.0 in | Wt 222.0 lb

## 2023-06-19 DIAGNOSIS — F17210 Nicotine dependence, cigarettes, uncomplicated: Secondary | ICD-10-CM | POA: Insufficient documentation

## 2023-06-19 DIAGNOSIS — Z9079 Acquired absence of other genital organ(s): Secondary | ICD-10-CM | POA: Diagnosis not present

## 2023-06-19 DIAGNOSIS — Z86711 Personal history of pulmonary embolism: Secondary | ICD-10-CM | POA: Diagnosis not present

## 2023-06-19 DIAGNOSIS — D751 Secondary polycythemia: Secondary | ICD-10-CM | POA: Diagnosis present

## 2023-06-19 DIAGNOSIS — Z90710 Acquired absence of both cervix and uterus: Secondary | ICD-10-CM | POA: Diagnosis not present

## 2023-06-19 DIAGNOSIS — Z90722 Acquired absence of ovaries, bilateral: Secondary | ICD-10-CM | POA: Insufficient documentation

## 2023-06-19 DIAGNOSIS — Z7901 Long term (current) use of anticoagulants: Secondary | ICD-10-CM | POA: Insufficient documentation

## 2023-06-19 DIAGNOSIS — Z79899 Other long term (current) drug therapy: Secondary | ICD-10-CM | POA: Insufficient documentation

## 2023-06-19 DIAGNOSIS — Z8542 Personal history of malignant neoplasm of other parts of uterus: Secondary | ICD-10-CM | POA: Insufficient documentation

## 2023-06-19 LAB — CBC WITH DIFFERENTIAL/PLATELET
Abs Immature Granulocytes: 0.06 10*3/uL (ref 0.00–0.07)
Basophils Absolute: 0.1 10*3/uL (ref 0.0–0.1)
Basophils Relative: 1 %
Eosinophils Absolute: 0.4 10*3/uL (ref 0.0–0.5)
Eosinophils Relative: 4 %
HCT: 49.2 % — ABNORMAL HIGH (ref 36.0–46.0)
Hemoglobin: 16.2 g/dL — ABNORMAL HIGH (ref 12.0–15.0)
Immature Granulocytes: 1 %
Lymphocytes Relative: 32 %
Lymphs Abs: 2.9 10*3/uL (ref 0.7–4.0)
MCH: 31 pg (ref 26.0–34.0)
MCHC: 32.9 g/dL (ref 30.0–36.0)
MCV: 94.1 fL (ref 80.0–100.0)
Monocytes Absolute: 0.5 10*3/uL (ref 0.1–1.0)
Monocytes Relative: 6 %
Neutro Abs: 5.1 10*3/uL (ref 1.7–7.7)
Neutrophils Relative %: 56 %
Platelets: 335 10*3/uL (ref 150–400)
RBC: 5.23 MIL/uL — ABNORMAL HIGH (ref 3.87–5.11)
RDW: 12.6 % (ref 11.5–15.5)
WBC: 9 10*3/uL (ref 4.0–10.5)
nRBC: 0 % (ref 0.0–0.2)

## 2023-06-19 NOTE — Patient Instructions (Signed)
You were seen and examined today by Dr. Ellin Saba. Dr. Ellin Saba is a hematologist, meaning that he specializes in blood abnormalities. Dr. Ellin Saba discussed your past medical history, family history of cancers/blood conditions and the events that led to you being here today.  You were referred to Dr. Ellin Saba due to elevated hemoglobin.  Dr. Ellin Saba has recommended additional labs today for further evaluation.  Follow-up as scheduled.

## 2023-06-19 NOTE — Progress Notes (Signed)
[facility] [address], [place] [address]   Clinic Day:  06/19/2023  Referring physician: Anabel Halon, MD  Patient Care Team: Anabel Halon, MD as PCP - General (Internal Medicine) Pricilla Riffle, MD as PCP - Cardiology (Cardiology) West Bali, MD (Inactive) (Gastroenterology) Lanelle Bal, DO as Consulting Physician (Internal Medicine)   ASSESSMENT & PLAN:   Assessment:  1.  Erythrocytosis: - Patient seen at the request of Dr. Allena Katz - CBC on 05/08/2023: Hb-16.4, HCT-48, RBC count 5.3. - Reports itching on the back lasting about 15 minutes after taking hot showers for the past 2 months.  No erythromelalgia's. - Smokes about 3 to 4 cigarettes/day started in 1989.  2.  Unprovoked pulmonary embolism: - Diagnosed in November 2018, on Eliquis since then.  3. Social/Family History: -Lives at home with husband. Retired with disability. 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 Dry ice exposure. Tobacco use of 3-4 cigarettes a day for 35 years, other than 2 ppd for 2 years.  -No family history of polycythemia. Maternal uncle had lung cancer. Maternal grandmother had lung cancer.  4.  History of endometrial cancer: - S/p TAH and BSO on 11/04/2015 at [facility].  Pathology showed stage Ia, grade 1-low risk.  Plan:  1.  Erythrocytosis: - We discussed secondary causes of erythrocytosis and clonal causes including polycythemia vera. - Recommend repeating CBC today.  Will check serum EPO level.  Will also check JAK2 V617F with reflex testing. - RTC 4 weeks for follow-up to discuss results.   Orders Placed This Encounter  Procedures   JAK2 V617F rfx CALR/MPL/E12-15    Standing Status:   Future    Number of Occurrences:   1    Standing Expiration Date:   06/18/2024   CBC with Differential    Standing Status:   Future    Number of Occurrences:   1    Standing  Expiration Date:   06/18/2024   Erythropoietin    Standing Status:   Future    Number of Occurrences:   1    Standing Expiration Date:   06/18/2024      Mikeal Hawthorne R Teague,acting as a scribe for Doreatha Massed, MD.,have documented all relevant documentation on the behalf of Doreatha Massed, MD,as directed by  Doreatha Massed, MD while in the presence of Doreatha Massed, MD.   I, Doreatha Massed MD, have reviewed the above documentation for accuracy and completeness, and I agree with the above.   Doreatha Massed, MD   10/8/20244:43 PM  CHIEF COMPLAINT/PURPOSE OF CONSULT:   Diagnosis: Erythrocytosis  Current Therapy: Under workup  HISTORY OF PRESENT ILLNESS:   Sonya James is a 57 y.o. female presenting to clinic today for evaluation of erythrocytosis at the request of Allena Katz, Rutwik K, M. She was seen by Rojelio Brenner, PA on 02/21/23.   Today, she states that she is doing well overall. Her appetite level is at 50%. Her energy level is at 60%.  She was found to have abnormal CBC from 05/08/23 with elevated RBC at 5.30, elevated HGB at 16.4, and elevated HCT at 48.0. She is currently on Eliquis BID and denies any bleeding issues from this. She had one episode of PE's on both sides of the lungs. She has a history of endometrial cancer and had a total hysterectomy. She reports aquagenic pruritus on the back  after hot showers that lasts for 15 minutes with an onset of 2-3 months. She denies any tingling in fingers and erythromelalgia. She is unaware if she has a history of strokes.   PAST MEDICAL HISTORY:   Past Medical History: Past Medical History:  Diagnosis Date   Anemia    Anxiety    Arthritis    Asthma 05/25/2013   Autonomic neuropathy    Cancer of endometrium (HCC) 10/18/2015   Cellulitis, face 05/03/2021   Constipation    COPD (chronic obstructive pulmonary disease) (HCC)    CTS (carpal tunnel syndrome)    Depression    Diabetes mellitus without  complication (HCC)    Dysphagia 12/14/2016   Encounter for screening colonoscopy    Endometrial cancer (HCC) 2017   Gastritis without bleeding    GERD (gastroesophageal reflux disease)    Gross hematuria 05/11/2019   Headache(784.0)    Heavy menses 10/11/2015   HTN (hypertension)    Hypercholesterolemia    Hypothyroidism    IBS (irritable bowel syndrome)    Osteoarthritis    osteoarthritis   Pancreatitis    per patient    Recurrent boils    buttocks and low back.   S/P laparoscopic hysterectomy 11/04/2015   Shortness of breath    Sleep apnea    CPAP machine   Uncontrolled type 2 diabetes mellitus with diabetic polyneuropathy, with long-term current use of insulin 08/04/2017   Uterine cancer (HCC) 2017   Vitamin D deficiency     Surgical History: Past Surgical History:  Procedure Laterality Date   ABDOMINAL HYSTERECTOMY  2018   endometrial cancer, surgery [facility]    BIOPSY  01/02/2017   Procedure: BIOPSY;  Surgeon: West Bali, MD;  Location: [facility];  Service: Endoscopy;;  gastric biopsy   CESAREAN SECTION  1989   COLONOSCOPY WITH PROPOFOL N/A 01/02/2017   Dr. Darrick Penna: redundant colon, two 2-3 mm polyps in sigmoid colon (hyperplastic)   EAR CYST EXCISION Right 11/29/2021   Procedure: EXCISION OF EXTERNAL EAR LESION;  Surgeon: Laren Boom, DO;  Location: [facility] OR;  Service: ENT;  Laterality: Right;   ESOPHAGOGASTRODUODENOSCOPY  05/22/2011   Dr. Darrick Penna: H.pylori gastritis    ESOPHAGOGASTRODUODENOSCOPY (EGD) WITH PROPOFOL N/A 01/02/2017   Dr. Darrick Penna: possible web in proximal esophagus s/p dilation, moderate gastritis (negative H.pylori)   FOOT SURGERY Left    tendon repair   IR US GUIDE VASC ACCESS LEFT  01/18/2021   OOPHORECTOMY     OTOPLASATY Left 06/15/2021   Procedure: Excision of left auricular lesion with endaural meatoplasty;  Surgeon: Laren Boom, DO;  Location: [facility] OR;  Service: ENT;  Laterality: Left;   POLYPECTOMY  01/02/2017    Procedure: POLYPECTOMY;  Surgeon: West Bali, MD;  Location: [facility];  Service: Endoscopy;;  sigmoid colon polyps times 2   REDUCTION MAMMAPLASTY  1996   SAVORY DILATION N/A 01/02/2017   Procedure: SAVORY DILATION;  Surgeon: West Bali, MD;  Location: [facility];  Service: Endoscopy;  Laterality: N/A;   TUBAL LIGATION      Social History: Social History   Socioeconomic History   Marital status: Married    Spouse name: Not on file   Number of children: 1   Years of education: Not on file   Highest education level: Not on file  Occupational History   Not on file  Tobacco Use   Smoking status: Every Day    Current packs/day: 1.50  Average packs/day: 1.5 packs/day for 22.0 years (33.0 ttl pk-yrs)    Types: Cigarettes   Smokeless tobacco: Never  Vaping Use   Vaping status: Never Used  Substance and Sexual Activity   Alcohol use: No   Drug use: No   Sexual activity: Not Currently    Birth control/protection: Surgical    Comment: hysterectomy  Other Topics Concern   Not on file  Social History Narrative   Lives with husband and she has been married to for 9 years.  Recently got evicted and has been living in a motel since last July.   She has 1 son that lives in [place].  Reports one grandbaby      Enjoys watching TV mostly animal [place].      Diet: Eats all food groups   Caffeine: Soda not diet, green tea, coffee about 3 cups of caffeine per day.   Water: Drinks 5-6 8 ounce bottles throughout the day.      Wears a seatbelt.  Currently does not have a vehicle.  Smoke detectors in the facility she is living in.  Does not have any weapons.   Social Determinants of Health   Financial Resource Strain: Patient Declined (10/09/2022)   Received from [facility], [facility]   Overall Financial Resource Strain (CARDIA)    Difficulty of Paying Living Expenses: Patient declined  Food Insecurity: Patient Declined (10/09/2022)   Received from [facility],  [facility]   Hunger Vital Sign    Worried About Running Out of Food in the Last Year: Patient declined    Ran Out of Food in the Last Year: Patient declined  Transportation Needs: Patient Declined (10/09/2022)   Received from [facility], [facility]   [facility] - Transportation    Lack of Transportation (Medical): Patient declined    Lack of Transportation (Non-Medical): Patient declined  Physical Activity: Insufficiently Active (04/06/2022)   Exercise Vital Sign    Days of Exercise per Week: 1 day    Minutes of Exercise per Session: 30 min  Stress: Stress Concern Present (04/06/2022)   [employer] [facility] [employer] - Occupational Stress Questionnaire    Feeling of Stress : Rather much  Social Connections: Unknown (10/04/2022)   Received from [facility], [facility]   Social Network    Social Network: Not on file  Intimate Partner Violence: Unknown (10/04/2022)   Received from [facility], [facility]   HITS    Physically Hurt: Not on file    Insult or Talk Down To: Not on file    Threaten Physical Harm: Not on file    Scream or Curse: Not on file    Family History: Family History  Problem Relation Age of Onset   Diabetes Mother    Hypertension Mother    COPD Mother    Asthma Mother    Hypercholesterolemia Mother    Hypertension Sister    Hyperlipidemia Sister    Diabetes Sister    Asthma Brother    Alcohol abuse Brother    Asthma Son    Cancer Maternal Grandmother        lung, throat, breast   Alzheimer's disease Maternal Grandmother    Hypertension Maternal Grandmother    Hypercholesterolemia Maternal Grandmother    Heart disease Maternal Grandfather    Stroke Maternal Grandfather    Clotting disorder Maternal Grandfather        blood clots in legs   Hypertension Sister    Hyperlipidemia Sister  Stroke Maternal Aunt    Clotting disorder Maternal Aunt        blood clots in legs   Hypertension Maternal Aunt    Hypercholesterolemia  Maternal Aunt    Hypertension Maternal Aunt    Asthma Maternal Aunt    Clotting disorder Maternal Uncle        blood clot -legs traveled to heart and he died of heart attack   Colon cancer Neg Hx     Current Medications:  Current Outpatient Medications:    Accu-Chek Softclix Lancets lancets, To test glucose 4 times a day, Disp: 150 each, Rfl: 2   albuterol (VENTOLIN HFA) 108 (90 Base) MCG/ACT inhaler, Inhale 2 puffs into the lungs every 6 (six) hours as needed for wheezing or shortness of breath., Disp: 18 g, Rfl: 5   amLODipine (NORVASC) 10 MG tablet, Take 1 tablet (10 mg total) by mouth daily., Disp: 90 tablet, Rfl: 1   apixaban (ELIQUIS) 5 MG TABS tablet, Take 1 tablet (5 mg total) by mouth 2 (two) times daily. On 08/12/17 @ 9PM, start 1 tab (5 mg) two times daily., Disp: 60 tablet, Rfl: 5   atorvastatin (LIPITOR) 80 MG tablet, Take 1 tablet (80 mg total) by mouth daily., Disp: 90 tablet, Rfl: 3   Blood Glucose Monitoring Suppl (ACCU-CHEK GUIDE ME) w/Device KIT, 1 Piece by Does not apply route as directed., Disp: 1 kit, Rfl: 0   Blood Glucose Monitoring Suppl DEVI, 1 each by Does not apply route in the morning, at noon, and at bedtime. May substitute to any manufacturer covered by patient's insurance., Disp: 1 each, Rfl: 0   clotrimazole (GYNE-LOTRIMIN 3) 2 % vaginal cream, Place 1 Applicatorful vaginally at bedtime., Disp: 21 g, Rfl: 0   Continuous Glucose Sensor (DEXCOM G7 SENSOR) MISC, 1 Device by Does not apply route as directed., Disp: 9 each, Rfl: 3   DULoxetine (CYMBALTA) 60 MG capsule, Take 1 capsule (60 mg total) by mouth daily., Disp: 90 capsule, Rfl: 1   empagliflozin (JARDIANCE) 25 MG TABS tablet, Take 1 tablet (25 mg total) by mouth daily before breakfast., Disp: 90 tablet, Rfl: 3   esomeprazole (NEXIUM) 40 MG capsule, TAKE 1 CAPSULE BY MOUTH TWICE DAILY BEFORE MEAL(S), Disp: 180 capsule, Rfl: 3   furosemide (LASIX) 40 MG tablet, Take 1 tablet by mouth once daily, Disp: 90  tablet, Rfl: 0   glucose blood (ACCU-CHEK GUIDE) test strip, Test glucose 4 times a day, Disp: 150 each, Rfl: 2   insulin glargine (LANTUS SOLOSTAR) 100 UNIT/ML Solostar Pen, Inject 46 Units into the skin daily., Disp: 45 mL, Rfl: 4   Insulin Pen Needle 31G X 5 MM MISC, 1 Device by Does not apply route daily in the afternoon., Disp: 100 each, Rfl: 3   labetalol (NORMODYNE) 100 MG tablet, Take 1 tablet (100 mg total) by mouth 2 (two) times daily., Disp: 180 tablet, Rfl: 1   linaclotide (LINZESS) 290 MCG CAPS capsule, TAKE 1 CAPSULE BY MOUTH ONCE DAILY BEFORE BREAKFAST, Disp: 90 capsule, Rfl: 3   metFORMIN (GLUCOPHAGE) 1000 MG tablet, Take 1 tablet (1,000 mg total) by mouth 2 (two) times daily with a meal., Disp: 90 tablet, Rfl: 3   Misc. Devices MISC, Blood pressure monitoring device, Disp: 1 each, Rfl: 0   naloxone (NARCAN) nasal spray 4 mg/0.1 mL, SMARTSIG:Spray(s) In Nostril, Disp: , Rfl:    ondansetron (ZOFRAN-ODT) 4 MG disintegrating tablet, Take 1 tablet (4 mg total) by mouth every 6 (six) hours as needed  for nausea or vomiting., Disp: 30 tablet, Rfl: 2   rizatriptan (MAXALT) 10 MG tablet, Take 10 mg by mouth daily as needed for migraine., Disp: , Rfl:    spironolactone (ALDACTONE) 50 MG tablet, Take 1 tablet (50 mg total) by mouth daily., Disp: 90 tablet, Rfl: 1   Tiotropium Bromide Monohydrate (SPIRIVA RESPIMAT) 2.5 MCG/ACT AERS, Inhale 2 puffs into the lungs daily., Disp: 12 g, Rfl: 1   traMADol (ULTRAM) 50 MG tablet, Take 50 mg by mouth 2 (two) times daily as needed., Disp: , Rfl:    Vitamin D, Ergocalciferol, (DRISDOL) 1.25 MG (50000 UNIT) CAPS capsule, Take 1 capsule by mouth once a week, Disp: 12 capsule, Rfl: 1   Allergies: Allergies  Allergen Reactions   Ibuprofen     dizziness   Lisinopril Swelling    Swelling in face    REVIEW OF SYSTEMS:   Review of Systems  Constitutional:  Negative for chills, fatigue and fever.  HENT:   Negative for lump/mass, mouth sores, nosebleeds,  sore throat and trouble swallowing.   Eyes:  Negative for eye problems.  Respiratory:  Positive for cough and shortness of breath.   Cardiovascular:  Positive for chest pain and palpitations. Negative for leg swelling.  Gastrointestinal:  Positive for constipation. Negative for abdominal pain, diarrhea, nausea and vomiting.  Genitourinary:  Negative for bladder incontinence, difficulty urinating, dysuria, frequency, hematuria and nocturia.   Musculoskeletal:  Negative for arthralgias, back pain, flank pain, myalgias and neck pain.  Skin:  Negative for itching and rash.  Neurological:  Positive for dizziness, headaches and numbness.  Hematological:  Does not bruise/bleed easily.  Psychiatric/Behavioral:  Positive for sleep disturbance. Negative for depression and suicidal ideas. The patient is not nervous/anxious.   All other systems reviewed and are negative.    VITALS:   Blood pressure 128/75, pulse 65, temperature 97.9 F (36.6 C), temperature source Oral, resp. rate 16, height 5\' 5"  (1.651 m), weight 222 lb (100.7 kg), last menstrual period 09/20/2015, SpO2 100%.  Wt Readings from Last 3 Encounters:  06/19/23 222 lb (100.7 kg)  05/29/23 227 lb (103 kg)  05/08/23 222 lb 9.6 oz (101 kg)    Body mass index is 36.94 kg/m.   PHYSICAL EXAM:   Physical Exam Vitals and nursing note reviewed. Exam conducted with a chaperone present.  Constitutional:      Appearance: Normal appearance.  Cardiovascular:     Rate and Rhythm: Normal rate and regular rhythm.     Pulses: Normal pulses.     Heart sounds: Normal heart sounds.  Pulmonary:     Effort: Pulmonary effort is normal.     Breath sounds: Normal breath sounds.  Abdominal:     Palpations: Abdomen is soft. There is no hepatomegaly, splenomegaly or mass.     Tenderness: There is no abdominal tenderness.  Musculoskeletal:     Right lower leg: No edema.     Left lower leg: No edema.  Lymphadenopathy:     Cervical: No cervical  adenopathy.     Right cervical: No superficial, deep or posterior cervical adenopathy.    Left cervical: No superficial, deep or posterior cervical adenopathy.     Upper Body:     Right upper body: No supraclavicular or axillary adenopathy.     Left upper body: No supraclavicular or axillary adenopathy.  Neurological:     General: No focal deficit present.     Mental Status: She is alert and oriented to person, place, and time.  Psychiatric:        Mood and Affect: Mood normal.        Behavior: Behavior normal.     LABS:      Latest Ref Rng & Units 06/19/2023    2:04 PM 05/08/2023    2:47 PM 02/21/2023    8:45 AM  CBC  WBC 4.0 - 10.5 K/uL 9.0  8.9  8.0   Hemoglobin 12.0 - 15.0 g/dL 11.9  14.7  82.9   Hematocrit 36.0 - 46.0 % 49.2  48.0  46.0   Platelets 150 - 400 K/uL 335  366  324       Latest Ref Rng & Units 05/08/2023    2:47 PM 02/21/2023    8:45 AM 01/18/2023    1:57 PM  CMP  Glucose 70 - 99 mg/dL 562  130  865   BUN 6 - 24 mg/dL 8  16  13    Creatinine 0.57 - 1.00 mg/dL 7.84  6.96  2.95   Sodium 134 - 144 mmol/L 141  137  142   Potassium 3.5 - 5.2 mmol/L 4.3  3.9  4.4   Chloride 96 - 106 mmol/L 103  103  105   CO2 20 - 29 mmol/L 22  26  24    Calcium 8.7 - 10.2 mg/dL 28.4  9.5  13.2   Total Protein 6.0 - 8.5 g/dL 7.5  7.7  6.5   Total Bilirubin 0.0 - 1.2 mg/dL 0.2  0.4  0.2   Alkaline Phos 44 - 121 IU/L 138  104  139   AST 0 - 40 IU/L 12  12  9    ALT 0 - 32 IU/L 14  13  6       No results found for: "CEA1", "CEA" / No results found for: "CEA1", "CEA" No results found for: "PSA1" No results found for: "GMW102" No results found for: "CAN125"  No results found for: "TOTALPROTELP", "ALBUMINELP", "A1GS", "A2GS", "BETS", "BETA2SER", "GAMS", "MSPIKE", "SPEI" No results found for: "TIBC", "FERRITIN", "IRONPCTSAT" Lab Results  Component Value Date   LDH 118 02/15/2021     STUDIES:   No results found.

## 2023-06-20 ENCOUNTER — Ambulatory Visit (HOSPITAL_COMMUNITY)
Admission: RE | Admit: 2023-06-20 | Discharge: 2023-06-20 | Disposition: A | Discharge status: Home / Self Care | Payer: Medicaid Other | Source: Ambulatory Visit | Attending: Internal Medicine | Admitting: Internal Medicine

## 2023-06-20 DIAGNOSIS — Z1231 Encounter for screening mammogram for malignant neoplasm of breast: Secondary | ICD-10-CM | POA: Insufficient documentation

## 2023-06-20 LAB — ERYTHROPOIETIN: Erythropoietin: 14.6 m[IU]/mL (ref 2.6–18.5)

## 2023-06-21 ENCOUNTER — Telehealth: Payer: Self-pay

## 2023-06-21 ENCOUNTER — Other Ambulatory Visit (HOSPITAL_COMMUNITY): Payer: Self-pay

## 2023-06-21 DIAGNOSIS — Z79891 Long term (current) use of opiate analgesic: Secondary | ICD-10-CM | POA: Diagnosis not present

## 2023-06-21 NOTE — Telephone Encounter (Signed)
Pharmacy Patient Advocate Encounter   Received notification from CoverMyMeds that prior authorization for Dexcom G7 sensor is required/requested.   Per test claim: PA required; PA submitted to [facility] via CoverMyMeds Key/confirmation #/EOC [id number] Status is pending

## 2023-06-21 NOTE — Telephone Encounter (Signed)
Pharmacy Patient Advocate Encounter   Received notification from CoverMyMeds that prior authorization for Jardiance is required/requested.   Per test claim: PA required; PA submitted to [facility] via CoverMyMeds Key/confirmation #/EOC [facility] Status is pending

## 2023-06-25 LAB — JAK2 V617F RFX CALR/MPL/E12-15

## 2023-06-25 LAB — CALR +MPL + E12-E15  (REFLEX)

## 2023-06-26 DIAGNOSIS — Z79899 Other long term (current) drug therapy: Secondary | ICD-10-CM | POA: Diagnosis not present

## 2023-06-26 NOTE — Telephone Encounter (Signed)
Patient aware.

## 2023-06-26 NOTE — Telephone Encounter (Signed)
Pharmacy Patient Advocate Encounter  Received notification from [facility] that Prior Authorization for Dexcom G7 sensor has been APPROVED through 12/20/2023   PA #/Case ID/Reference #: [id number]

## 2023-06-26 NOTE — Telephone Encounter (Signed)
Pharmacy Patient Advocate Encounter  Received notification from [facility] that Prior Authorization for [place] has been APPROVED through 06/20/2024  PA #/Case ID/Reference #: [id number]

## 2023-07-09 DIAGNOSIS — M2570 Osteophyte, unspecified joint: Secondary | ICD-10-CM | POA: Diagnosis not present

## 2023-07-09 DIAGNOSIS — L600 Ingrowing nail: Secondary | ICD-10-CM | POA: Diagnosis not present

## 2023-07-11 ENCOUNTER — Ambulatory Visit (INDEPENDENT_AMBULATORY_CARE_PROVIDER_SITE_OTHER): Payer: Medicaid Other | Admitting: Orthopaedic Surgery

## 2023-07-11 ENCOUNTER — Encounter: Payer: Self-pay | Admitting: Orthopaedic Surgery

## 2023-07-11 DIAGNOSIS — G8929 Other chronic pain: Secondary | ICD-10-CM

## 2023-07-11 DIAGNOSIS — M25561 Pain in right knee: Secondary | ICD-10-CM

## 2023-07-11 DIAGNOSIS — M25562 Pain in left knee: Secondary | ICD-10-CM

## 2023-07-11 MED ORDER — METHYLPREDNISOLONE ACETATE 40 MG/ML IJ SUSP
40.0000 mg | Freq: Once | INTRAMUSCULAR | Status: AC
Start: 2023-07-11 — End: 2023-07-11
  Administered 2023-07-11: 40 mg via INTRA_ARTICULAR

## 2023-07-11 NOTE — Progress Notes (Signed)
PROCEDURE NOTE:  The patient requests injections of the left knee , verbal consent was obtained.  The left knee was prepped appropriately after time out was performed.   Sterile technique was observed and injection of 1 cc of DepoMedrol 40 mg with several cc's of plain xylocaine. Anesthesia was provided by ethyl chloride and a 20-gauge needle was used to inject the knee area. The injection was tolerated well.  A band aid dressing was applied.  The patient was advised to apply ice later today and tomorrow to the injection sight as needed.  PROCEDURE NOTE:  The patient requests injections of the right knee , verbal consent was obtained.  The right knee was prepped appropriately after time out was performed.   Sterile technique was observed and injection of 1 cc of DepoMedrol 40mg  with several cc's of plain xylocaine. Anesthesia was provided by ethyl chloride and a 20-gauge needle was used to inject the knee area. The injection was tolerated well.  A band aid dressing was applied.  The patient was advised to apply ice later today and tomorrow to the injection sight as needed.  Encounter Diagnosis  Name Primary?   Chronic pain of both knees Yes   Return in six weeks.  Call if any problem.  Precautions discussed.  Electronically Signed Darreld Mclean, MD 10/30/20248:08 AM

## 2023-07-11 NOTE — Addendum Note (Signed)
Addended byCaffie Damme on: 07/11/2023 09:01 AM   Modules accepted: Orders

## 2023-07-12 ENCOUNTER — Telehealth: Payer: Self-pay | Admitting: Internal Medicine

## 2023-07-12 ENCOUNTER — Other Ambulatory Visit: Payer: Self-pay | Admitting: Internal Medicine

## 2023-07-12 DIAGNOSIS — J449 Chronic obstructive pulmonary disease, unspecified: Secondary | ICD-10-CM

## 2023-07-12 MED ORDER — ALBUTEROL SULFATE (2.5 MG/3ML) 0.083% IN NEBU
2.5000 mg | INHALATION_SOLUTION | Freq: Four times a day (QID) | RESPIRATORY_TRACT | 1 refills | Status: AC | PRN
Start: 2023-07-12 — End: ?

## 2023-07-12 NOTE — Telephone Encounter (Signed)
Prescription Request  07/12/2023  LOV: 05/08/2023  What is the name of the medication or equipment? ALBUTEROL NEBULIZER SOLUTION   Have you contacted your pharmacy to request a refill? No   Which pharmacy would you like this sent to?  [facility] [address] [place] - [address]  [place] [address] Phone: [phone] Fax: [phone]  Patient notified that their request is being sent to the clinical staff for review and that they should receive a response within 2 business days.   Please advise at [facility] [phone]

## 2023-07-16 DIAGNOSIS — T8189XA Other complications of procedures, not elsewhere classified, initial encounter: Secondary | ICD-10-CM | POA: Diagnosis not present

## 2023-07-17 ENCOUNTER — Inpatient Hospital Stay: Payer: Medicaid Other | Admitting: Hematology

## 2023-07-17 NOTE — Progress Notes (Incomplete)
[facility] [address], [place] [address]   Clinic Day:  07/17/2023  Referring physician: Anabel Halon, MD  Patient Care Team: Anabel Halon, MD as PCP - General (Internal Medicine) Pricilla Riffle, MD as PCP - Cardiology (Cardiology) West Bali, MD (Inactive) (Gastroenterology) Lanelle Bal, DO as Consulting Physician (Internal Medicine)   ASSESSMENT & PLAN:   Assessment:  1.  Erythrocytosis: - Patient seen at the request of Dr. Allena Katz - CBC on 05/08/2023: Hb-16.4, HCT-48, RBC count 5.3. - Reports itching on the back lasting about 15 minutes after taking hot showers for the past 2 months.  No erythromelalgia's. - Smokes about 3 to 4 cigarettes/day started in 1989.  2.  Unprovoked pulmonary embolism: - Diagnosed in November 2018, on Eliquis since then.  3. Social/Family History: -Lives at home with husband. Retired with disability. 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 Dry ice exposure. Tobacco use of 3-4 cigarettes a day for 35 years, other than 2 ppd for 2 years.  -No family history of polycythemia. Maternal uncle had lung cancer. Maternal grandmother had lung cancer.  4.  History of endometrial cancer: - S/p TAH and BSO on 11/04/2015 at [facility].  Pathology showed stage Ia, grade 1-low risk.  Plan:  1.  Erythrocytosis: - We discussed secondary causes of erythrocytosis and clonal causes including polycythemia vera. - Recommend repeating CBC today.  Will check serum EPO level.  Will also check JAK2 V617F with reflex testing. - RTC 4 weeks for follow-up to discuss results.   No orders of the defined types were placed in this encounter.     Alben Deeds Teague,acting as a Neurosurgeon for Doreatha Massed, MD.,have documented all relevant documentation on the behalf of Doreatha Massed, MD,as directed by  Doreatha Massed, MD while in the  presence of Doreatha Massed, MD.  ***   Lissa Hoard R Teague   11/5/202410:09 AM  CHIEF COMPLAINT/PURPOSE OF CONSULT:   Diagnosis: Erythrocytosis  Current Therapy: Under workup  HISTORY OF PRESENT ILLNESS:   Sonya James is a 57 y.o. female presenting to clinic today for evaluation of erythrocytosis at the request of Allena Katz, Rutwik K, M. She was seen by Rojelio Brenner, PA on 02/21/23.   Today, she states that she is doing well overall. Her appetite level is at 50%. Her energy level is at 60%.  She was found to have abnormal CBC from 05/08/23 with elevated RBC at 5.30, elevated HGB at 16.4, and elevated HCT at 48.0. She is currently on Eliquis BID and denies any bleeding issues from this. She had one episode of PE's on both sides of the lungs. She has a history of endometrial cancer and had a total hysterectomy. She reports aquagenic pruritus on the back after hot showers that lasts for 15 minutes with an onset of 2-3 months. She denies any tingling in fingers and erythromelalgia. She is unaware if she has a history of strokes.   INTERVAL HISTORY:   Sonya James is a 57 y.o. female presenting to the clinic today for follow-up of Erythrocytosis. She was last seen by me on 06/19/23 in consultation.  Today, she states that she is doing well overall. Her appetite level is at ***%. Her energy level is at ***%.   PAST MEDICAL HISTORY:   Past Medical History: Past Medical History:  Diagnosis Date   Anemia  Anxiety    Arthritis    Asthma 05/25/2013   Autonomic neuropathy    Cancer of endometrium (HCC) 10/18/2015   Cellulitis, face 05/03/2021   Constipation    COPD (chronic obstructive pulmonary disease) (HCC)    CTS (carpal tunnel syndrome)    Depression    Diabetes mellitus without complication (HCC)    Dysphagia 12/14/2016   Encounter for screening colonoscopy    Endometrial cancer (HCC) 2017   Gastritis without bleeding    GERD (gastroesophageal reflux disease)    Gross  hematuria 05/11/2019   Headache(784.0)    Heavy menses 10/11/2015   HTN (hypertension)    Hypercholesterolemia    Hypothyroidism    IBS (irritable bowel syndrome)    Osteoarthritis    osteoarthritis   Pancreatitis    per patient    Recurrent boils    buttocks and low back.   S/P laparoscopic hysterectomy 11/04/2015   Shortness of breath    Sleep apnea    CPAP machine   Uncontrolled type 2 diabetes mellitus with diabetic polyneuropathy, with long-term current use of insulin 08/04/2017   Uterine cancer (HCC) 2017   Vitamin D deficiency     Surgical History: Past Surgical History:  Procedure Laterality Date   ABDOMINAL HYSTERECTOMY  2018   endometrial cancer, surgery done at [facility]    BIOPSY  01/02/2017   Procedure: BIOPSY;  Surgeon: West Bali, MD;  Location: [facility];  Service: Endoscopy;;  gastric biopsy   CESAREAN SECTION  1989   COLONOSCOPY WITH PROPOFOL N/A 01/02/2017   Dr. Darrick Penna: redundant colon, two 2-3 mm polyps in sigmoid colon (hyperplastic)   EAR CYST EXCISION Right 11/29/2021   Procedure: EXCISION OF EXTERNAL EAR LESION;  Surgeon: Laren Boom, DO;  Location: [facility] OR;  Service: ENT;  Laterality: Right;   ESOPHAGOGASTRODUODENOSCOPY  05/22/2011   Dr. Darrick Penna: H.pylori gastritis    ESOPHAGOGASTRODUODENOSCOPY (EGD) WITH PROPOFOL N/A 01/02/2017   Dr. Darrick Penna: possible web in proximal esophagus s/p dilation, moderate gastritis (negative H.pylori)   FOOT SURGERY Left    tendon repair   IR US GUIDE VASC ACCESS LEFT  01/18/2021   OOPHORECTOMY     OTOPLASATY Left 06/15/2021   Procedure: Excision of left auricular lesion with endaural meatoplasty;  Surgeon: Laren Boom, DO;  Location: [facility] OR;  Service: ENT;  Laterality: Left;   POLYPECTOMY  01/02/2017   Procedure: POLYPECTOMY;  Surgeon: West Bali, MD;  Location: [facility];  Service: Endoscopy;;  sigmoid colon polyps times 2   REDUCTION MAMMAPLASTY  1996   SAVORY DILATION N/A 01/02/2017    Procedure: SAVORY DILATION;  Surgeon: West Bali, MD;  Location: [facility];  Service: Endoscopy;  Laterality: N/A;   TUBAL LIGATION      Social History: Social History   Socioeconomic History   Marital status: Married    Spouse name: Not on file   Number of children: 1   Years of education: Not on file   Highest education level: Not on file  Occupational History   Not on file  Tobacco Use   Smoking status: Every Day    Current packs/day: 1.50    Average packs/day: 1.5 packs/day for 22.0 years (33.0 ttl pk-yrs)    Types: Cigarettes   Smokeless tobacco: Never  Vaping Use   Vaping status: Never Used  Substance and Sexual Activity   Alcohol use: No   Drug use: No   Sexual activity: Not Currently  Birth control/protection: Surgical    Comment: hysterectomy  Other Topics Concern   Not on file  Social History Narrative   Lives with husband and she has been married to for 9 years.  Recently got evicted and has been living in a motel since last July.   She has 1 son that lives in [place].  Reports one grandbaby      Enjoys watching TV mostly animal [place].      Diet: Eats all food groups   Caffeine: Soda not diet, green tea, coffee about 3 cups of caffeine per day.   Water: Drinks 5-6 8 ounce bottles throughout the day.      Wears a seatbelt.  Currently does not have a vehicle.  Smoke detectors in the facility she is living in.  Does not have any weapons.   Social Determinants of Health   Financial Resource Strain: Patient Declined (10/09/2022)   Received from [facility], [facility]   Overall Financial Resource Strain (CARDIA)    Difficulty of Paying Living Expenses: Patient declined  Food Insecurity: Patient Declined (10/09/2022)   Received from [facility], [facility]   Hunger Vital Sign    Worried About Running Out of Food in the Last Year: Patient declined    Ran Out of Food in the Last Year: Patient declined  Transportation Needs: Patient  Declined (10/09/2022)   Received from [facility], [facility]   [facility] - Transportation    Lack of Transportation (Medical): Patient declined    Lack of Transportation (Non-Medical): Patient declined  Physical Activity: Insufficiently Active (04/06/2022)   Exercise Vital Sign    Days of Exercise per Week: 1 day    Minutes of Exercise per Session: 30 min  Stress: Stress Concern Present (04/06/2022)   [employer] [facility] [employer] - Occupational Stress Questionnaire    Feeling of Stress : Rather much  Social Connections: Unknown (10/04/2022)   Received from [facility], [facility]   Social Network    Social Network: Not on file  Intimate Partner Violence: Unknown (10/04/2022)   Received from [facility], [facility]   HITS    Physically Hurt: Not on file    Insult or Talk Down To: Not on file    Threaten Physical Harm: Not on file    Scream or Curse: Not on file    Family History: Family History  Problem Relation Age of Onset   Diabetes Mother    Hypertension Mother    COPD Mother    Asthma Mother    Hypercholesterolemia Mother    Hypertension Sister    Hyperlipidemia Sister    Diabetes Sister    Asthma Brother    Alcohol abuse Brother    Asthma Son    Cancer Maternal Grandmother        lung, throat, breast   Alzheimer's disease Maternal Grandmother    Hypertension Maternal Grandmother    Hypercholesterolemia Maternal Grandmother    Heart disease Maternal Grandfather    Stroke Maternal Grandfather    Clotting disorder Maternal Grandfather        blood clots in legs   Hypertension Sister    Hyperlipidemia Sister    Stroke Maternal Aunt    Clotting disorder Maternal Aunt        blood clots in legs   Hypertension Maternal Aunt    Hypercholesterolemia Maternal Aunt    Hypertension Maternal Aunt    Asthma Maternal Aunt    Clotting disorder Maternal Uncle  blood clot -legs traveled to heart and he died of heart attack   Colon cancer Neg Hx      Current Medications:  Current Outpatient Medications:    Accu-Chek Softclix Lancets lancets, To test glucose 4 times a day, Disp: 150 each, Rfl: 2   albuterol (PROVENTIL) (2.5 MG/3ML) 0.083% nebulizer solution, Take 3 mLs (2.5 mg total) by nebulization every 6 (six) hours as needed for wheezing or shortness of breath., Disp: 150 mL, Rfl: 1   albuterol (VENTOLIN HFA) 108 (90 Base) MCG/ACT inhaler, Inhale 2 puffs into the lungs every 6 (six) hours as needed for wheezing or shortness of breath., Disp: 18 g, Rfl: 5   amLODipine (NORVASC) 10 MG tablet, Take 1 tablet (10 mg total) by mouth daily., Disp: 90 tablet, Rfl: 1   apixaban (ELIQUIS) 5 MG TABS tablet, Take 1 tablet (5 mg total) by mouth 2 (two) times daily. On 08/12/17 @ 9PM, start 1 tab (5 mg) two times daily., Disp: 60 tablet, Rfl: 5   atorvastatin (LIPITOR) 80 MG tablet, Take 1 tablet (80 mg total) by mouth daily., Disp: 90 tablet, Rfl: 3   Blood Glucose Monitoring Suppl (ACCU-CHEK GUIDE ME) w/Device KIT, 1 Piece by Does not apply route as directed., Disp: 1 kit, Rfl: 0   Blood Glucose Monitoring Suppl DEVI, 1 each by Does not apply route in the morning, at noon, and at bedtime. May substitute to any manufacturer covered by patient's insurance., Disp: 1 each, Rfl: 0   clotrimazole (GYNE-LOTRIMIN 3) 2 % vaginal cream, Place 1 Applicatorful vaginally at bedtime., Disp: 21 g, Rfl: 0   Continuous Glucose Sensor (DEXCOM G7 SENSOR) MISC, 1 Device by Does not apply route as directed., Disp: 9 each, Rfl: 3   DULoxetine (CYMBALTA) 60 MG capsule, Take 1 capsule (60 mg total) by mouth daily., Disp: 90 capsule, Rfl: 1   empagliflozin (JARDIANCE) 25 MG TABS tablet, Take 1 tablet (25 mg total) by mouth daily before breakfast., Disp: 90 tablet, Rfl: 3   esomeprazole (NEXIUM) 40 MG capsule, TAKE 1 CAPSULE BY MOUTH TWICE DAILY BEFORE MEAL(S), Disp: 180 capsule, Rfl: 3   furosemide (LASIX) 40 MG tablet, Take 1 tablet by mouth once daily, Disp: 90 tablet,  Rfl: 0   glucose blood (ACCU-CHEK GUIDE) test strip, Test glucose 4 times a day, Disp: 150 each, Rfl: 2   insulin glargine (LANTUS SOLOSTAR) 100 UNIT/ML Solostar Pen, Inject 46 Units into the skin daily., Disp: 45 mL, Rfl: 4   Insulin Pen Needle 31G X 5 MM MISC, 1 Device by Does not apply route daily in the afternoon., Disp: 100 each, Rfl: 3   labetalol (NORMODYNE) 100 MG tablet, Take 1 tablet (100 mg total) by mouth 2 (two) times daily., Disp: 180 tablet, Rfl: 1   linaclotide (LINZESS) 290 MCG CAPS capsule, TAKE 1 CAPSULE BY MOUTH ONCE DAILY BEFORE BREAKFAST, Disp: 90 capsule, Rfl: 3   metFORMIN (GLUCOPHAGE) 1000 MG tablet, Take 1 tablet (1,000 mg total) by mouth 2 (two) times daily with a meal., Disp: 90 tablet, Rfl: 3   Misc. Devices MISC, Blood pressure monitoring device, Disp: 1 each, Rfl: 0   naloxone (NARCAN) nasal spray 4 mg/0.1 mL, SMARTSIG:Spray(s) In Nostril, Disp: , Rfl:    ondansetron (ZOFRAN-ODT) 4 MG disintegrating tablet, Take 1 tablet (4 mg total) by mouth every 6 (six) hours as needed for nausea or vomiting., Disp: 30 tablet, Rfl: 2   rizatriptan (MAXALT) 10 MG tablet, Take 10 mg by mouth daily as needed  for migraine., Disp: , Rfl:    spironolactone (ALDACTONE) 50 MG tablet, Take 1 tablet (50 mg total) by mouth daily., Disp: 90 tablet, Rfl: 1   Tiotropium Bromide Monohydrate (SPIRIVA RESPIMAT) 2.5 MCG/ACT AERS, Inhale 2 puffs into the lungs daily., Disp: 12 g, Rfl: 1   traMADol (ULTRAM) 50 MG tablet, Take 50 mg by mouth 2 (two) times daily as needed., Disp: , Rfl:    Vitamin D, Ergocalciferol, (DRISDOL) 1.25 MG (50000 UNIT) CAPS capsule, Take 1 capsule by mouth once a week, Disp: 12 capsule, Rfl: 1   Allergies: Allergies  Allergen Reactions   Ibuprofen     dizziness   Lisinopril Swelling    Swelling in face    REVIEW OF SYSTEMS:   Review of Systems  Constitutional:  Negative for chills, fatigue and fever.  HENT:   Negative for lump/mass, mouth sores, nosebleeds, sore  throat and trouble swallowing.   Eyes:  Negative for eye problems.  Respiratory:  Negative for cough and shortness of breath.   Cardiovascular:  Negative for chest pain, leg swelling and palpitations.  Gastrointestinal:  Negative for abdominal pain, constipation, diarrhea, nausea and vomiting.  Genitourinary:  Negative for bladder incontinence, difficulty urinating, dysuria, frequency, hematuria and nocturia.   Musculoskeletal:  Negative for arthralgias, back pain, flank pain, myalgias and neck pain.  Skin:  Negative for itching and rash.  Neurological:  Negative for dizziness, headaches and numbness.  Hematological:  Does not bruise/bleed easily.  Psychiatric/Behavioral:  Negative for depression, sleep disturbance and suicidal ideas. The patient is not nervous/anxious.   All other systems reviewed and are negative.    VITALS:   Last menstrual period 09/20/2015.  Wt Readings from Last 3 Encounters:  06/19/23 222 lb (100.7 kg)  05/29/23 227 lb (103 kg)  05/08/23 222 lb 9.6 oz (101 kg)    There is no height or weight on file to calculate BMI.   PHYSICAL EXAM:   Physical Exam Vitals and nursing note reviewed. Exam conducted with a chaperone present.  Constitutional:      Appearance: Normal appearance.  Cardiovascular:     Rate and Rhythm: Normal rate and regular rhythm.     Pulses: Normal pulses.     Heart sounds: Normal heart sounds.  Pulmonary:     Effort: Pulmonary effort is normal.     Breath sounds: Normal breath sounds.  Abdominal:     Palpations: Abdomen is soft. There is no hepatomegaly, splenomegaly or mass.     Tenderness: There is no abdominal tenderness.  Musculoskeletal:     Right lower leg: No edema.     Left lower leg: No edema.  Lymphadenopathy:     Cervical: No cervical adenopathy.     Right cervical: No superficial, deep or posterior cervical adenopathy.    Left cervical: No superficial, deep or posterior cervical adenopathy.     Upper Body:     Right  upper body: No supraclavicular or axillary adenopathy.     Left upper body: No supraclavicular or axillary adenopathy.  Neurological:     General: No focal deficit present.     Mental Status: She is alert and oriented to person, place, and time.  Psychiatric:        Mood and Affect: Mood normal.        Behavior: Behavior normal.     LABS:      Latest Ref Rng & Units 06/19/2023    2:04 PM 05/08/2023    2:47 PM 02/21/2023  8:45 AM  CBC  WBC 4.0 - 10.5 K/uL 9.0  8.9  8.0   Hemoglobin 12.0 - 15.0 g/dL 42.5  95.6  38.7   Hematocrit 36.0 - 46.0 % 49.2  48.0  46.0   Platelets 150 - 400 K/uL 335  366  324       Latest Ref Rng & Units 05/08/2023    2:47 PM 02/21/2023    8:45 AM 01/18/2023    1:57 PM  CMP  Glucose 70 - 99 mg/dL 564  332  951   BUN 6 - 24 mg/dL 8  16  13    Creatinine 0.57 - 1.00 mg/dL 8.84  1.66  0.63   Sodium 134 - 144 mmol/L 141  137  142   Potassium 3.5 - 5.2 mmol/L 4.3  3.9  4.4   Chloride 96 - 106 mmol/L 103  103  105   CO2 20 - 29 mmol/L 22  26  24    Calcium 8.7 - 10.2 mg/dL 01.6  9.5  01.0   Total Protein 6.0 - 8.5 g/dL 7.5  7.7  6.5   Total Bilirubin 0.0 - 1.2 mg/dL 0.2  0.4  0.2   Alkaline Phos 44 - 121 IU/L 138  104  139   AST 0 - 40 IU/L 12  12  9    ALT 0 - 32 IU/L 14  13  6       No results found for: "CEA1", "CEA" / No results found for: "CEA1", "CEA" No results found for: "PSA1" No results found for: "XNA355" No results found for: "CAN125"  No results found for: "TOTALPROTELP", "ALBUMINELP", "A1GS", "A2GS", "BETS", "BETA2SER", "GAMS", "MSPIKE", "SPEI" No results found for: "TIBC", "FERRITIN", "IRONPCTSAT" Lab Results  Component Value Date   LDH 118 02/15/2021     STUDIES:   MM 3D SCREENING MAMMOGRAM BILATERAL BREAST  Result Date: 06/21/2023 CLINICAL DATA:  Screening. EXAM: DIGITAL SCREENING BILATERAL MAMMOGRAM WITH TOMOSYNTHESIS AND CAD TECHNIQUE: Bilateral screening digital craniocaudal and mediolateral oblique mammograms were obtained.  Bilateral screening digital breast tomosynthesis was performed. The images were evaluated with computer-aided detection. COMPARISON:  Previous exam(s). ACR Breast Density Category a: The breasts are almost entirely fatty. FINDINGS: There are no findings suspicious for malignancy. IMPRESSION: No mammographic evidence of malignancy. A result letter of this screening mammogram will be mailed directly to the patient. RECOMMENDATION: Screening mammogram in one year. (Code:SM-B-01Y) BI-RADS CATEGORY  1: Negative. Electronically Signed   By: Hulan Saas M.D.   On: 06/21/2023 16:10

## 2023-07-18 ENCOUNTER — Inpatient Hospital Stay: Payer: Medicaid Other | Attending: Physician Assistant | Admitting: Hematology

## 2023-07-18 VITALS — BP 129/86 | HR 54 | Temp 98.3°F | Resp 16 | Wt 239.2 lb

## 2023-07-18 DIAGNOSIS — D751 Secondary polycythemia: Secondary | ICD-10-CM | POA: Diagnosis not present

## 2023-07-18 DIAGNOSIS — Z86711 Personal history of pulmonary embolism: Secondary | ICD-10-CM | POA: Insufficient documentation

## 2023-07-18 DIAGNOSIS — Z90710 Acquired absence of both cervix and uterus: Secondary | ICD-10-CM | POA: Insufficient documentation

## 2023-07-18 DIAGNOSIS — Z8542 Personal history of malignant neoplasm of other parts of uterus: Secondary | ICD-10-CM | POA: Insufficient documentation

## 2023-07-18 DIAGNOSIS — Z90721 Acquired absence of ovaries, unilateral: Secondary | ICD-10-CM | POA: Insufficient documentation

## 2023-07-18 DIAGNOSIS — F17210 Nicotine dependence, cigarettes, uncomplicated: Secondary | ICD-10-CM | POA: Insufficient documentation

## 2023-07-18 NOTE — Patient Instructions (Addendum)
[facility] at [facility] Discharge Instructions   You were seen and examined today by Dr. Ellin Saba.  He reviewed the results of your lab work. Your hemoglobin is slightly elevated at 16. The other lab work we did to check for leukemia is normal.   We will see you back in 6 months. We will repeat lab work at that time.   Return as scheduled.    Thank you for choosing [facility] at [facility] to provide your oncology and hematology care.  To afford each patient quality time with our provider, please arrive at least 15 minutes before your scheduled appointment time.   If you have a lab appointment with the Cancer Center please come in thru the Main Entrance and check in at the main information desk.  You need to re-schedule your appointment should you arrive 10 or more minutes late.  We strive to give you quality time with our providers, and arriving late affects you and other patients whose appointments are after yours.  Also, if you no show three or more times for appointments you may be dismissed from the clinic at the providers discretion.     Again, thank you for choosing [facility].  Our hope is that these requests will decrease the amount of time that you wait before being seen by our physicians.       _____________________________________________________________  Should you have questions after your visit to [facility], please contact our office at [phone] and follow the prompts.  Our office hours are 8:00 a.m. and 4:30 p.m. Monday - Friday.  Please note that voicemails left after 4:00 p.m. may not be returned until the following business day.  We are closed weekends and major holidays.  You do have access to a nurse 24-7, just call the main number to the clinic [phone] and do not press any options, hold on the line and a nurse will answer the phone.    For prescription refill requests, have your  pharmacy contact our office and allow 72 hours.    Due to Covid, you will need to wear a mask upon entering the hospital. If you do not have a mask, a mask will be given to you at the Main Entrance upon arrival. For doctor visits, patients may have 1 support person age 39 or older with them. For treatment visits, patients can not have anyone with them due to social distancing guidelines and our immunocompromised population.

## 2023-07-18 NOTE — Progress Notes (Signed)
[facility] [address] [place] [address]    Clinic Day:  07/18/2023  Referring physician: Anabel Halon, MD  Patient Care Team: Anabel Halon, MD as PCP - General (Internal Medicine) Pricilla Riffle, MD as PCP - Cardiology (Cardiology) West Bali, MD (Inactive) (Gastroenterology) Lanelle Bal, DO as Consulting Physician (Internal Medicine)   ASSESSMENT & PLAN:   Assessment: 1.  Erythrocytosis: - Patient seen at the request of Dr. Allena Katz - CBC on 05/08/2023: Hb-16.4, HCT-48, RBC count 5.3. - Reports itching on the back lasting about 15 minutes after taking hot showers for the past 2 months.  No erythromelalgia's. - Smokes about 3 to 4 cigarettes/day started in 1989. - JAK2 V617F and reflex testing negative on 06/19/2023.  Serum EPO level 14.6.   2.  Unprovoked pulmonary embolism: - Diagnosed in November 2018, on Eliquis since then.   3. Social/Family History: -Lives at home with husband. Retired with disability. 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 Dry ice exposure. Tobacco use of 3-4 cigarettes a day for 35 years, other than 2 ppd for 2 years.  -No family history of polycythemia. Maternal uncle had lung cancer. Maternal grandmother had lung cancer.   4.  History of endometrial cancer: - S/p TAH and BSO on 11/04/2015 at [facility].  Pathology showed stage Ia, grade 1-low risk.  Plan:  1.  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  MPN workup is negative.  EPO level is 14.  Hemoglobin was 16.2 and hematocrit 49. - She has sleep apnea and does not use her CPAP machine regularly but once a month.  She reports that nostrils are irritated by the prongs of the machine.  She will follow-up with her CPAP doctor to fix this. - She is also on spironolactone and Lasix which can cause plasma volume contraction and artificially elevate  hemoglobin and RBC counts.  I did not recommend any further workup at this time.  RTC 6 months for follow-up with repeat CBC and LDH.  If there is any significant changes, consider bone marrow aspiration and biopsy.   Orders Placed This Encounter  Procedures   CBC with Differential    Standing Status:   Future    Standing Expiration Date:   07/17/2024   Lactate dehydrogenase    Standing Status:   Future    Standing Expiration Date:   07/17/2024      Doreatha Massed, MD   11/6/20245:30 PM  CHIEF COMPLAINT:   Diagnosis: Secondary erythrocytosis  Cancer Staging  No matching staging information was found for the patient.    Prior Therapy: None  Current Therapy: Observation   HISTORY OF PRESENT ILLNESS:   Oncology History   No history exists.     INTERVAL HISTORY:   Sonya James is a 57 y.o. female presenting to clinic today for follow up of erythrocytosis. She was last seen by me on 06/19/2023.  Today, she states that she is doing well overall. Her appetite level is at 50%. Her energy level is at 50%.  PAST MEDICAL HISTORY:   Past Medical History: Past Medical History:  Diagnosis Date   Anemia    Anxiety    Arthritis    Asthma 05/25/2013   Autonomic neuropathy    Cancer of endometrium (HCC) 10/18/2015   Cellulitis, face 05/03/2021   Constipation  COPD (chronic obstructive pulmonary disease) (HCC)    CTS (carpal tunnel syndrome)    Depression    Diabetes mellitus without complication (HCC)    Dysphagia 12/14/2016   Encounter for screening colonoscopy    Endometrial cancer (HCC) 2017   Gastritis without bleeding    GERD (gastroesophageal reflux disease)    Gross hematuria 05/11/2019   Headache(784.0)    Heavy menses 10/11/2015   HTN (hypertension)    Hypercholesterolemia    Hypothyroidism    IBS (irritable bowel syndrome)    Osteoarthritis    osteoarthritis   Pancreatitis    per patient    Recurrent boils    buttocks and low back.   S/P laparoscopic  hysterectomy 11/04/2015   Shortness of breath    Sleep apnea    CPAP machine   Uncontrolled type 2 diabetes mellitus with diabetic polyneuropathy, with long-term current use of insulin 08/04/2017   Uterine cancer (HCC) 2017   Vitamin D deficiency     Surgical History: Past Surgical History:  Procedure Laterality Date   ABDOMINAL HYSTERECTOMY  2018   endometrial cancer, surgery done at [facility]    BIOPSY  01/02/2017   Procedure: BIOPSY;  Surgeon: West Bali, MD;  Location: [facility];  Service: Endoscopy;;  gastric biopsy   CESAREAN SECTION  1989   COLONOSCOPY WITH PROPOFOL N/A 01/02/2017   Dr. Darrick Penna: redundant colon, two 2-3 mm polyps in sigmoid colon (hyperplastic)   EAR CYST EXCISION Right 11/29/2021   Procedure: EXCISION OF EXTERNAL EAR LESION;  Surgeon: Laren Boom, DO;  Location: [facility] OR;  Service: ENT;  Laterality: Right;   ESOPHAGOGASTRODUODENOSCOPY  05/22/2011   Dr. Darrick Penna: H.pylori gastritis    ESOPHAGOGASTRODUODENOSCOPY (EGD) WITH PROPOFOL N/A 01/02/2017   Dr. Darrick Penna: possible web in proximal esophagus s/p dilation, moderate gastritis (negative H.pylori)   FOOT SURGERY Left    tendon repair   IR US GUIDE VASC ACCESS LEFT  01/18/2021   OOPHORECTOMY     OTOPLASATY Left 06/15/2021   Procedure: Excision of left auricular lesion with endaural meatoplasty;  Surgeon: Laren Boom, DO;  Location: [facility] OR;  Service: ENT;  Laterality: Left;   POLYPECTOMY  01/02/2017   Procedure: POLYPECTOMY;  Surgeon: West Bali, MD;  Location: [facility];  Service: Endoscopy;;  sigmoid colon polyps times 2   REDUCTION MAMMAPLASTY  1996   SAVORY DILATION N/A 01/02/2017   Procedure: SAVORY DILATION;  Surgeon: West Bali, MD;  Location: [facility];  Service: Endoscopy;  Laterality: N/A;   TUBAL LIGATION      Social History: Social History   Socioeconomic History   Marital status: Married    Spouse name: Not on file   Number of children: 1   Years of education:  Not on file   Highest education level: Not on file  Occupational History   Not on file  Tobacco Use   Smoking status: Every Day    Current packs/day: 1.50    Average packs/day: 1.5 packs/day for 22.0 years (33.0 ttl pk-yrs)    Types: Cigarettes   Smokeless tobacco: Never  Vaping Use   Vaping status: Never Used  Substance and Sexual Activity   Alcohol use: No   Drug use: No   Sexual activity: Not Currently    Birth control/protection: Surgical    Comment: hysterectomy  Other Topics Concern   Not on file  Social History Narrative   Lives with husband and she has been married to for  9 years.  Recently got evicted and has been living in a motel since last July.   She has 1 son that lives in [place].  Reports one grandbaby      Enjoys watching TV mostly animal [place].      Diet: Eats all food groups   Caffeine: Soda not diet, green tea, coffee about 3 cups of caffeine per day.   Water: Drinks 5-6 8 ounce bottles throughout the day.      Wears a seatbelt.  Currently does not have a vehicle.  Smoke detectors in the facility she is living in.  Does not have any weapons.   Social Determinants of Health   Financial Resource Strain: Patient Declined (10/09/2022)   Received from [facility], [facility]   Overall Financial Resource Strain (CARDIA)    Difficulty of Paying Living Expenses: Patient declined  Food Insecurity: Patient Declined (10/09/2022)   Received from [facility], [facility]   Hunger Vital Sign    Worried About Running Out of Food in the Last Year: Patient declined    Ran Out of Food in the Last Year: Patient declined  Transportation Needs: Patient Declined (10/09/2022)   Received from [facility], [facility]   [facility] - Transportation    Lack of Transportation (Medical): Patient declined    Lack of Transportation (Non-Medical): Patient declined  Physical Activity: Insufficiently Active (04/06/2022)   Exercise Vital Sign    Days of Exercise per  Week: 1 day    Minutes of Exercise per Session: 30 min  Stress: Stress Concern Present (04/06/2022)   [employer] [facility] [employer] [facility] - Occupational Stress Questionnaire    Feeling of Stress : Rather much  Social Connections: Unknown (10/04/2022)   Received from [facility], [facility]   Social Network    Social Network: Not on file  Intimate Partner Violence: Unknown (10/04/2022)   Received from [facility], [facility]   HITS    Physically Hurt: Not on file    Insult or Talk Down To: Not on file    Threaten Physical Harm: Not on file    Scream or Curse: Not on file    Family History: Family History  Problem Relation Age of Onset   Diabetes Mother    Hypertension Mother    COPD Mother    Asthma Mother    Hypercholesterolemia Mother    Hypertension Sister    Hyperlipidemia Sister    Diabetes Sister    Asthma Brother    Alcohol abuse Brother    Asthma Son    Cancer Maternal Grandmother        lung, throat, breast   Alzheimer's disease Maternal Grandmother    Hypertension Maternal Grandmother    Hypercholesterolemia Maternal Grandmother    Heart disease Maternal Grandfather    Stroke Maternal Grandfather    Clotting disorder Maternal Grandfather        blood clots in legs   Hypertension Sister    Hyperlipidemia Sister    Stroke Maternal Aunt    Clotting disorder Maternal Aunt        blood clots in legs   Hypertension Maternal Aunt    Hypercholesterolemia Maternal Aunt    Hypertension Maternal Aunt    Asthma Maternal Aunt    Clotting disorder Maternal Uncle        blood clot -legs traveled to heart and he died of heart attack   Colon cancer Neg Hx     Current Medications:  Current Outpatient Medications:  Accu-Chek Softclix Lancets lancets, To test glucose 4 times a day, Disp: 150 each, Rfl: 2   albuterol (PROVENTIL) (2.5 MG/3ML) 0.083% nebulizer solution, Take 3 mLs (2.5 mg total) by nebulization every 6 (six) hours as needed for wheezing or  shortness of breath., Disp: 150 mL, Rfl: 1   albuterol (VENTOLIN HFA) 108 (90 Base) MCG/ACT inhaler, Inhale 2 puffs into the lungs every 6 (six) hours as needed for wheezing or shortness of breath., Disp: 18 g, Rfl: 5   amLODipine (NORVASC) 10 MG tablet, Take 1 tablet (10 mg total) by mouth daily., Disp: 90 tablet, Rfl: 1   apixaban (ELIQUIS) 5 MG TABS tablet, Take 1 tablet (5 mg total) by mouth 2 (two) times daily. On 08/12/17 @ 9PM, start 1 tab (5 mg) two times daily., Disp: 60 tablet, Rfl: 5   atorvastatin (LIPITOR) 80 MG tablet, Take 1 tablet (80 mg total) by mouth daily., Disp: 90 tablet, Rfl: 3   Blood Glucose Monitoring Suppl (ACCU-CHEK GUIDE ME) w/Device KIT, 1 Piece by Does not apply route as directed., Disp: 1 kit, Rfl: 0   Blood Glucose Monitoring Suppl DEVI, 1 each by Does not apply route in the morning, at noon, and at bedtime. May substitute to any manufacturer covered by patient's insurance., Disp: 1 each, Rfl: 0   clotrimazole (GYNE-LOTRIMIN 3) 2 % vaginal cream, Place 1 Applicatorful vaginally at bedtime., Disp: 21 g, Rfl: 0   Continuous Glucose Sensor (DEXCOM G7 SENSOR) MISC, 1 Device by Does not apply route as directed., Disp: 9 each, Rfl: 3   DULoxetine (CYMBALTA) 60 MG capsule, Take 1 capsule (60 mg total) by mouth daily., Disp: 90 capsule, Rfl: 1   empagliflozin (JARDIANCE) 25 MG TABS tablet, Take 1 tablet (25 mg total) by mouth daily before breakfast., Disp: 90 tablet, Rfl: 3   esomeprazole (NEXIUM) 40 MG capsule, TAKE 1 CAPSULE BY MOUTH TWICE DAILY BEFORE MEAL(S), Disp: 180 capsule, Rfl: 3   furosemide (LASIX) 40 MG tablet, Take 1 tablet by mouth once daily, Disp: 90 tablet, Rfl: 0   glucose blood (ACCU-CHEK GUIDE) test strip, Test glucose 4 times a day, Disp: 150 each, Rfl: 2   insulin glargine (LANTUS SOLOSTAR) 100 UNIT/ML Solostar Pen, Inject 46 Units into the skin daily., Disp: 45 mL, Rfl: 4   Insulin Pen Needle 31G X 5 MM MISC, 1 Device by Does not apply route daily in the  afternoon., Disp: 100 each, Rfl: 3   labetalol (NORMODYNE) 100 MG tablet, Take 1 tablet (100 mg total) by mouth 2 (two) times daily., Disp: 180 tablet, Rfl: 1   linaclotide (LINZESS) 290 MCG CAPS capsule, TAKE 1 CAPSULE BY MOUTH ONCE DAILY BEFORE BREAKFAST, Disp: 90 capsule, Rfl: 3   metFORMIN (GLUCOPHAGE) 1000 MG tablet, Take 1 tablet (1,000 mg total) by mouth 2 (two) times daily with a meal., Disp: 90 tablet, Rfl: 3   Misc. Devices MISC, Blood pressure monitoring device, Disp: 1 each, Rfl: 0   naloxone (NARCAN) nasal spray 4 mg/0.1 mL, SMARTSIG:Spray(s) In Nostril, Disp: , Rfl:    ondansetron (ZOFRAN-ODT) 4 MG disintegrating tablet, Take 1 tablet (4 mg total) by mouth every 6 (six) hours as needed for nausea or vomiting., Disp: 30 tablet, Rfl: 2   rizatriptan (MAXALT) 10 MG tablet, Take 10 mg by mouth daily as needed for migraine., Disp: , Rfl:    spironolactone (ALDACTONE) 50 MG tablet, Take 1 tablet (50 mg total) by mouth daily., Disp: 90 tablet, Rfl: 1   Tiotropium Bromide  Monohydrate (SPIRIVA RESPIMAT) 2.5 MCG/ACT AERS, Inhale 2 puffs into the lungs daily., Disp: 12 g, Rfl: 1   traMADol (ULTRAM) 50 MG tablet, Take 50 mg by mouth 2 (two) times daily as needed., Disp: , Rfl:    Vitamin D, Ergocalciferol, (DRISDOL) 1.25 MG (50000 UNIT) CAPS capsule, Take 1 capsule by mouth once a week, Disp: 12 capsule, Rfl: 1   Allergies: Allergies  Allergen Reactions   Ibuprofen     dizziness   Lisinopril Swelling    Swelling in face    REVIEW OF SYSTEMS:   Review of Systems  Respiratory:  Positive for cough and shortness of breath.   Cardiovascular:  Positive for palpitations.  Gastrointestinal:  Positive for constipation.  Neurological:  Positive for dizziness and headaches.  Psychiatric/Behavioral:  Positive for sleep disturbance.   All other systems reviewed and are negative.    VITALS:   Blood pressure 129/86, pulse (!) 54, temperature 98.3 F (36.8 C), temperature source Oral, resp.  rate 16, weight 239 lb 3.2 oz (108.5 kg), last menstrual period 09/20/2015, SpO2 100%.  Wt Readings from Last 3 Encounters:  07/18/23 239 lb 3.2 oz (108.5 kg)  06/19/23 222 lb (100.7 kg)  05/29/23 227 lb (103 kg)    Body mass index is 39.8 kg/m.  Performance status (ECOG): 1 - Symptomatic but completely ambulatory  PHYSICAL EXAM:   Physical Exam Vitals reviewed.  Constitutional:      Appearance: Normal appearance.  Cardiovascular:     Rate and Rhythm: Normal rate and regular rhythm.     Heart sounds: Normal heart sounds.  Pulmonary:     Effort: Pulmonary effort is normal.     Breath sounds: Normal breath sounds.  Abdominal:     Palpations: Abdomen is soft. There is no mass.  Neurological:     Mental Status: She is alert.  Psychiatric:        Mood and Affect: Mood normal.        Behavior: Behavior normal.     LABS:      Latest Ref Rng & Units 06/19/2023    2:04 PM 05/08/2023    2:47 PM 02/21/2023    8:45 AM  CBC  WBC 4.0 - 10.5 K/uL 9.0  8.9  8.0   Hemoglobin 12.0 - 15.0 g/dL 16.1  09.6  04.5   Hematocrit 36.0 - 46.0 % 49.2  48.0  46.0   Platelets 150 - 400 K/uL 335  366  324       Latest Ref Rng & Units 05/08/2023    2:47 PM 02/21/2023    8:45 AM 01/18/2023    1:57 PM  CMP  Glucose 70 - 99 mg/dL 409  811  914   BUN 6 - 24 mg/dL 8  16  13    Creatinine 0.57 - 1.00 mg/dL 7.82  9.56  2.13   Sodium 134 - 144 mmol/L 141  137  142   Potassium 3.5 - 5.2 mmol/L 4.3  3.9  4.4   Chloride 96 - 106 mmol/L 103  103  105   CO2 20 - 29 mmol/L 22  26  24    Calcium 8.7 - 10.2 mg/dL 08.6  9.5  57.8   Total Protein 6.0 - 8.5 g/dL 7.5  7.7  6.5   Total Bilirubin 0.0 - 1.2 mg/dL 0.2  0.4  0.2   Alkaline Phos 44 - 121 IU/L 138  104  139   AST 0 - 40 IU/L 12  12  9   ALT 0 - 32 IU/L 14  13  6       No results found for: "CEA1", "CEA" / No results found for: "CEA1", "CEA" No results found for: "PSA1" No results found for: "BMW413" No results found for: "CAN125"  No results  found for: "TOTALPROTELP", "ALBUMINELP", "A1GS", "A2GS", "BETS", "BETA2SER", "GAMS", "MSPIKE", "SPEI" No results found for: "TIBC", "FERRITIN", "IRONPCTSAT" Lab Results  Component Value Date   LDH 118 02/15/2021     STUDIES:   MM 3D SCREENING MAMMOGRAM BILATERAL BREAST  Result Date: 06/21/2023 CLINICAL DATA:  Screening. EXAM: DIGITAL SCREENING BILATERAL MAMMOGRAM WITH TOMOSYNTHESIS AND CAD TECHNIQUE: Bilateral screening digital craniocaudal and mediolateral oblique mammograms were obtained. Bilateral screening digital breast tomosynthesis was performed. The images were evaluated with computer-aided detection. COMPARISON:  Previous exam(s). ACR Breast Density Category a: The breasts are almost entirely fatty. FINDINGS: There are no findings suspicious for malignancy. IMPRESSION: No mammographic evidence of malignancy. A result letter of this screening mammogram will be mailed directly to the patient. RECOMMENDATION: Screening mammogram in one year. (Code:SM-B-01Y) BI-RADS CATEGORY  1: Negative. Electronically Signed   By: Hulan Saas M.D.   On: 06/21/2023 16:10

## 2023-07-23 DIAGNOSIS — Z79891 Long term (current) use of opiate analgesic: Secondary | ICD-10-CM | POA: Diagnosis not present

## 2023-07-27 DIAGNOSIS — Z79899 Other long term (current) drug therapy: Secondary | ICD-10-CM | POA: Diagnosis not present

## 2023-08-13 ENCOUNTER — Encounter: Payer: Self-pay | Admitting: Internal Medicine

## 2023-08-13 ENCOUNTER — Ambulatory Visit (INDEPENDENT_AMBULATORY_CARE_PROVIDER_SITE_OTHER): Payer: Medicaid Other | Admitting: Internal Medicine

## 2023-08-13 VITALS — BP 164/70 | HR 75 | Ht 65.0 in | Wt 237.6 lb

## 2023-08-13 DIAGNOSIS — I10 Essential (primary) hypertension: Secondary | ICD-10-CM

## 2023-08-13 DIAGNOSIS — E1165 Type 2 diabetes mellitus with hyperglycemia: Secondary | ICD-10-CM

## 2023-08-13 DIAGNOSIS — J449 Chronic obstructive pulmonary disease, unspecified: Secondary | ICD-10-CM | POA: Diagnosis not present

## 2023-08-13 DIAGNOSIS — Z794 Long term (current) use of insulin: Secondary | ICD-10-CM

## 2023-08-13 DIAGNOSIS — E782 Mixed hyperlipidemia: Secondary | ICD-10-CM

## 2023-08-13 DIAGNOSIS — F331 Major depressive disorder, recurrent, moderate: Secondary | ICD-10-CM | POA: Diagnosis not present

## 2023-08-13 MED ORDER — SPIRONOLACTONE 50 MG PO TABS
50.0000 mg | ORAL_TABLET | Freq: Every day | ORAL | 1 refills | Status: DC
Start: 2023-08-13 — End: 2024-05-30

## 2023-08-13 MED ORDER — AMLODIPINE BESYLATE 10 MG PO TABS
10.0000 mg | ORAL_TABLET | Freq: Every day | ORAL | 1 refills | Status: DC
Start: 2023-08-13 — End: 2024-05-30

## 2023-08-13 NOTE — Patient Instructions (Signed)
Please start taking Amlodipine and Spironolactone in addition to Labetalol.  Please continue to take medications as prescribed.  Please continue to follow low carb diet and perform moderate exercise/walking at least 150 mins/week.

## 2023-08-13 NOTE — Assessment & Plan Note (Addendum)
Well-controlled with Albuterol inhaler PRN

## 2023-08-13 NOTE — Progress Notes (Signed)
Established Patient Office Visit  Subjective:  Patient ID: Sonya James, female    DOB: Jul 08, 1966  Age: 57 y.o. MRN: [id number]  CC:  Chief Complaint  Patient presents with   Diabetes    Three month follow up    Hypertension    Three month follow up     HPI Sonya James is a 57 y.o. female with past medical history of HTN, type II DM, HLD, COPD, OSA, PE, GERD, obesity and tobacco abuse who presents for f/u of her chronic medical conditions.  HTN: Her BP is elevated today. She still takes amlodipine 10 mg QD, spironolactone 50 mg QD and labetalol 100 mg BID, but compliance is questionable. She has not refilled amlodipine and spironolactone recently.  She denies any headache, chest pain or palpitations.  Type II DM with HLD: Her HbA1c has improved to 8.4 in 08/24 from 10.1 in 05/24.  She is on Lantus 46 units, Metformin 1000 mg BID and Jardiance 25 mg QD.  She had CGM, her 90-day average is 177. She takes Lipitor for HLD.  She complains of dyspnea and wheezing intermittently, and has been using albuterol inhaler for her COPD. Denies any hemoptysis.  MDD: She has felt better with Cymbalta. She had anxiety, apathy, crying spells and decreased concentration, which is chronic.  She has moved out of her mother's home as she was stressing her out.  She gets anxious when her  mother tries to call her. Denies any SI or HI currently.   Past Medical History:  Diagnosis Date   Anemia    Anxiety    Arthritis    Asthma 05/25/2013   Autonomic neuropathy    Cancer of endometrium (HCC) 10/18/2015   Cellulitis, face 05/03/2021   Constipation    COPD (chronic obstructive pulmonary disease) (HCC)    CTS (carpal tunnel syndrome)    Depression    Diabetes mellitus without complication (HCC)    Dysphagia 12/14/2016   Encounter for screening colonoscopy    Endometrial cancer (HCC) 2017   Gastritis without bleeding    GERD (gastroesophageal reflux disease)    Gross hematuria 05/11/2019    Headache(784.0)    Heavy menses 10/11/2015   HTN (hypertension)    Hypercholesterolemia    Hypothyroidism    IBS (irritable bowel syndrome)    Osteoarthritis    osteoarthritis   Pancreatitis    per patient    Recurrent boils    buttocks and low back.   S/P laparoscopic hysterectomy 11/04/2015   Shortness of breath    Sleep apnea    CPAP machine   Uncontrolled type 2 diabetes mellitus with diabetic polyneuropathy, with long-term current use of insulin 08/04/2017   Uterine cancer (HCC) 2017   Vitamin D deficiency     Past Surgical History:  Procedure Laterality Date   ABDOMINAL HYSTERECTOMY  2018   endometrial cancer, surgery done at [facility]    BIOPSY  01/02/2017   Procedure: BIOPSY;  Surgeon: West Bali, MD;  Location: [facility];  Service: Endoscopy;;  gastric biopsy   CESAREAN SECTION  1989   COLONOSCOPY WITH PROPOFOL N/A 01/02/2017   Dr. Darrick Penna: redundant colon, two 2-3 mm polyps in sigmoid colon (hyperplastic)   EAR CYST EXCISION Right 11/29/2021   Procedure: EXCISION OF EXTERNAL EAR LESION;  Surgeon: Laren Boom, DO;  Location: [facility];  Service: ENT;  Laterality: Right;   ESOPHAGOGASTRODUODENOSCOPY  05/22/2011   Dr. Darrick Penna: H.pylori gastritis    ESOPHAGOGASTRODUODENOSCOPY (  EGD) WITH PROPOFOL N/A 01/02/2017   Dr. Darrick Penna: possible web in proximal esophagus s/p dilation, moderate gastritis (negative H.pylori)   FOOT SURGERY Left    tendon repair   IR US GUIDE VASC ACCESS LEFT  01/18/2021   OOPHORECTOMY     OTOPLASATY Left 06/15/2021   Procedure: Excision of left auricular lesion with endaural meatoplasty;  Surgeon: Laren Boom, DO;  Location: [facility];  Service: ENT;  Laterality: Left;   POLYPECTOMY  01/02/2017   Procedure: POLYPECTOMY;  Surgeon: West Bali, MD;  Location: [facility];  Service: Endoscopy;;  sigmoid colon polyps times 2   REDUCTION MAMMAPLASTY  1996   SAVORY DILATION N/A 01/02/2017   Procedure: SAVORY DILATION;  Surgeon: West Bali, MD;  Location: [facility];  Service: Endoscopy;  Laterality: N/A;   TUBAL LIGATION      Family History  Problem Relation Age of Onset   Diabetes Mother    Hypertension Mother    COPD Mother    Asthma Mother    Hypercholesterolemia Mother    Hypertension Sister    Hyperlipidemia Sister    Diabetes Sister    Asthma Brother    Alcohol abuse Brother    Asthma Son    Cancer Maternal Grandmother        lung, throat, breast   Alzheimer's disease Maternal Grandmother    Hypertension Maternal Grandmother    Hypercholesterolemia Maternal Grandmother    Heart disease Maternal Grandfather    Stroke Maternal Grandfather    Clotting disorder Maternal Grandfather        blood clots in legs   Hypertension Sister    Hyperlipidemia Sister    Stroke Maternal Aunt    Clotting disorder Maternal Aunt        blood clots in legs   Hypertension Maternal Aunt    Hypercholesterolemia Maternal Aunt    Hypertension Maternal Aunt    Asthma Maternal Aunt    Clotting disorder Maternal Uncle        blood clot -legs traveled to heart and he died of heart attack   Colon cancer Neg Hx     Social History   Socioeconomic History   Marital status: Married    Spouse name: Not on file   Number of children: 1   Years of education: Not on file   Highest education level: Not on file  Occupational History   Not on file  Tobacco Use   Smoking status: Every Day    Current packs/day: 1.50    Average packs/day: 1.5 packs/day for 22.0 years (33.0 ttl pk-yrs)    Types: Cigarettes   Smokeless tobacco: Never  Vaping Use   Vaping status: Never Used  Substance and Sexual Activity   Alcohol use: No   Drug use: No   Sexual activity: Not Currently    Birth control/protection: Surgical    Comment: hysterectomy  Other Topics Concern   Not on file  Social History Narrative   Lives with husband and she has been married to for 9 years.  Recently got evicted and has been living in a motel since last  July.   She has 1 son that lives in [place].  Reports one grandbaby      Enjoys watching TV mostly animal [place].      Diet: Eats all food groups   Caffeine: Soda not diet, green tea, coffee about 3 cups of caffeine per day.   Water: Drinks 5-6 8 ounce bottles throughout  the day.      Wears a seatbelt.  Currently does not have a vehicle.  Smoke detectors in the facility she is living in.  Does not have any weapons.   Social Determinants of Health   Financial Resource Strain: Patient Declined (10/09/2022)   Received from [facility], [facility]   Overall Financial Resource Strain (CARDIA)    Difficulty of Paying Living Expenses: Patient declined  Food Insecurity: Patient Declined (10/09/2022)   Received from [facility], [facility]   Hunger Vital Sign    Worried About Running Out of Food in the Last Year: Patient declined    Ran Out of Food in the Last Year: Patient declined  Transportation Needs: Patient Declined (10/09/2022)   Received from [facility], [facility]   PRAPARE - Transportation    Lack of Transportation (Medical): Patient declined    Lack of Transportation (Non-Medical): Patient declined  Physical Activity: Insufficiently Active (04/06/2022)   Exercise Vital Sign    Days of Exercise per Week: 1 day    Minutes of Exercise per Session: 30 min  Stress: Stress Concern Present (04/06/2022)   [employer] of Occupational [employer] - Occupational Stress Questionnaire    Feeling of Stress : Rather much  Social Connections: Unknown (10/04/2022)   Received from [facility], [facility]   Social Network    Social Network: Not on file  Intimate Partner Violence: Unknown (10/04/2022)   Received from [employer], [facility]   HITS    Physically Hurt: Not on file    Insult or Talk Down To: Not on file    Threaten Physical Harm: Not on file    Scream or Curse: Not on file    Outpatient Medications Prior to Visit  Medication Sig Dispense Refill    Accu-Chek Softclix Lancets lancets To test glucose 4 times a day 150 each 2   albuterol (PROVENTIL) (2.5 MG/3ML) 0.083% nebulizer solution Take 3 mLs (2.5 mg total) by nebulization every 6 (six) hours as needed for wheezing or shortness of breath. 150 mL 1   albuterol (VENTOLIN HFA) 108 (90 Base) MCG/ACT inhaler Inhale 2 puffs into the lungs every 6 (six) hours as needed for wheezing or shortness of breath. 18 g 5   apixaban (ELIQUIS) 5 MG TABS tablet Take 1 tablet (5 mg total) by mouth 2 (two) times daily. On 08/12/17 @ 9PM, start 1 tab (5 mg) two times daily. 60 tablet 5   atorvastatin (LIPITOR) 80 MG tablet Take 1 tablet (80 mg total) by mouth daily. 90 tablet 3   Blood Glucose Monitoring Suppl (ACCU-CHEK GUIDE ME) w/Device KIT 1 Piece by Does not apply route as directed. 1 kit 0   Blood Glucose Monitoring Suppl DEVI 1 each by Does not apply route in the morning, at noon, and at bedtime. May substitute to any manufacturer covered by patient's insurance. 1 each 0   clotrimazole (GYNE-LOTRIMIN 3) 2 % vaginal cream Place 1 Applicatorful vaginally at bedtime. 21 g 0   Continuous Glucose Sensor (DEXCOM G7 SENSOR) MISC 1 Device by Does not apply route as directed. 9 each 3   DULoxetine (CYMBALTA) 60 MG capsule Take 1 capsule (60 mg total) by mouth daily. 90 capsule 1   empagliflozin (JARDIANCE) 25 MG TABS tablet Take 1 tablet (25 mg total) by mouth daily before breakfast. 90 tablet 3   esomeprazole (NEXIUM) 40 MG capsule TAKE 1 CAPSULE BY MOUTH TWICE DAILY BEFORE MEAL(S) 180 capsule 3   furosemide (LASIX) 40  MG tablet Take 1 tablet by mouth once daily 90 tablet 0   glucose blood (ACCU-CHEK GUIDE) test strip Test glucose 4 times a day 150 each 2   insulin glargine (LANTUS SOLOSTAR) 100 UNIT/ML Solostar Pen Inject 46 Units into the skin daily. 45 mL 4   Insulin Pen Needle 31G X 5 MM MISC 1 Device by Does not apply route daily in the afternoon. 100 each 3   labetalol (NORMODYNE) 100 MG tablet Take 1 tablet  (100 mg total) by mouth 2 (two) times daily. 180 tablet 1   linaclotide (LINZESS) 290 MCG CAPS capsule TAKE 1 CAPSULE BY MOUTH ONCE DAILY BEFORE BREAKFAST 90 capsule 3   metFORMIN (GLUCOPHAGE) 1000 MG tablet Take 1 tablet (1,000 mg total) by mouth 2 (two) times daily with a meal. 90 tablet 3   Misc. Devices MISC Blood pressure monitoring device 1 each 0   naloxone (NARCAN) nasal spray 4 mg/0.1 mL SMARTSIG:Spray(s) In Nostril     ondansetron (ZOFRAN-ODT) 4 MG disintegrating tablet Take 1 tablet (4 mg total) by mouth every 6 (six) hours as needed for nausea or vomiting. 30 tablet 2   rizatriptan (MAXALT) 10 MG tablet Take 10 mg by mouth daily as needed for migraine.     Tiotropium Bromide Monohydrate (SPIRIVA RESPIMAT) 2.5 MCG/ACT AERS Inhale 2 puffs into the lungs daily. 12 g 1   traMADol (ULTRAM) 50 MG tablet Take 50 mg by mouth 2 (two) times daily as needed.     Vitamin D, Ergocalciferol, (DRISDOL) 1.25 MG (50000 UNIT) CAPS capsule Take 1 capsule by mouth once a week 12 capsule 1   amLODipine (NORVASC) 10 MG tablet Take 1 tablet (10 mg total) by mouth daily. 90 tablet 1   spironolactone (ALDACTONE) 50 MG tablet Take 1 tablet (50 mg total) by mouth daily. 90 tablet 1   No facility-administered medications prior to visit.    Allergies  Allergen Reactions   Ibuprofen     dizziness   Lisinopril Swelling    Swelling in face    ROS Review of Systems  Constitutional:  Negative for chills and fever.  HENT:  Negative for congestion, sinus pressure, sinus pain and sore throat.   Eyes:  Negative for pain and discharge.  Respiratory:  Negative for cough, shortness of breath and wheezing.   Cardiovascular:  Negative for chest pain and palpitations.  Gastrointestinal:  Negative for abdominal pain, diarrhea, nausea and vomiting.  Endocrine: Negative for polydipsia and polyuria.       Hot flashes  Genitourinary:  Negative for dysuria and hematuria.  Musculoskeletal:  Positive for arthralgias and  back pain. Negative for neck pain and neck stiffness.  Skin:  Negative for rash.  Neurological:  Negative for dizziness and weakness.  Psychiatric/Behavioral:  Negative for agitation and behavioral problems.       Objective:    Physical Exam Vitals reviewed.  Constitutional:      General: She is not in acute distress.    Appearance: She is obese. She is not diaphoretic.  HENT:     Head: Normocephalic and atraumatic.     Nose: Nose normal. No congestion.     Mouth/Throat:     Mouth: Mucous membranes are moist.     Pharynx: No posterior oropharyngeal erythema.  Eyes:     General: No scleral icterus.    Extraocular Movements: Extraocular movements intact.  Neck:     Vascular: No carotid bruit.  Cardiovascular:     Rate and Rhythm: Normal  rate and regular rhythm.     Pulses: Normal pulses.     Heart sounds: Murmur (Systolic over upper sternal border) heard.  Pulmonary:     Breath sounds: Normal breath sounds. No rales.  Abdominal:     Palpations: Abdomen is soft.     Tenderness: There is no abdominal tenderness.  Musculoskeletal:     Cervical back: Neck supple. No tenderness.     Right lower leg: No edema.     Left lower leg: No edema.  Skin:    General: Skin is warm.     Findings: No rash.  Neurological:     General: No focal deficit present.     Mental Status: She is alert and oriented to person, place, and time.     Sensory: No sensory deficit.     Motor: No weakness.  Psychiatric:        Mood and Affect: Mood is depressed. Affect is tearful.        Behavior: Behavior normal.     BP (!) 164/70 (BP Location: Left Arm)   Pulse 75   Ht 5\' 5"  (1.651 m)   Wt 237 lb 9.6 oz (107.8 kg)   LMP 09/20/2015 (Exact Date)   SpO2 97%   BMI 39.54 kg/m  Wt Readings from Last 3 Encounters:  08/13/23 237 lb 9.6 oz (107.8 kg)  07/18/23 239 lb 3.2 oz (108.5 kg)  06/19/23 222 lb (100.7 kg)    Lab Results  Component Value Date   TSH 1.670 05/08/2023   Lab Results   Component Value Date   WBC 9.0 06/19/2023   HGB 16.2 (H) 06/19/2023   HCT 49.2 (H) 06/19/2023   MCV 94.1 06/19/2023   PLT 335 06/19/2023   Lab Results  Component Value Date   NA 141 05/08/2023   K 4.3 05/08/2023   CO2 22 05/08/2023   GLUCOSE 144 (H) 05/08/2023   BUN 8 05/08/2023   CREATININE 0.72 05/08/2023   BILITOT 0.2 05/08/2023   ALKPHOS 138 (H) 05/08/2023   AST 12 05/08/2023   ALT 14 05/08/2023   PROT 7.5 05/08/2023   ALBUMIN 4.4 05/08/2023   CALCIUM 10.6 (H) 05/08/2023   ANIONGAP 8 02/21/2023   EGFR 97 05/08/2023   Lab Results  Component Value Date   CHOL 252 (H) 05/08/2023   Lab Results  Component Value Date   HDL 49 05/08/2023   Lab Results  Component Value Date   LDLCALC 189 (H) 05/08/2023   Lab Results  Component Value Date   TRIG 84 05/08/2023   Lab Results  Component Value Date   CHOLHDL 5.1 (H) 05/08/2023   Lab Results  Component Value Date   HGBA1C 8.4 (H) 05/08/2023      Assessment & Plan:   Problem List Items Addressed This Visit       Cardiovascular and Mediastinum   Essential hypertension - Primary    BP Readings from Last 1 Encounters:  08/13/23 (!) 164/70   Uncontrolled with Amlodipine 10 mg QD, Spironolactone 50 mg QD and Labetalol 100 mg BID - compliance is questionable Needs to restart Amlodipine and Spironolactone Counseled for compliance with the medications Advised DASH diet and moderate exercise/walking, at least 150 mins/week      Relevant Medications   amLODipine (NORVASC) 10 MG tablet   spironolactone (ALDACTONE) 50 MG tablet     Respiratory   Chronic obstructive pulmonary disease (HCC)    Well-controlled with Albuterol inhaler PRN  Endocrine   Uncontrolled type 2 diabetes mellitus with hyperglycemia, with long-term current use of insulin (HCC)    Lab Results  Component Value Date   HGBA1C 8.4 (H) 05/08/2023   Uncontrolled, but improving On Metformin, Jardiance and Lantus 46 U QD, followed by  endocrinology - needs follow up Advised to follow diabetic diet On statin and ACEi Diabetic eye exam: Advised to follow up with Ophthalmology for diabetic eye exam      Relevant Orders   CMP14+EGFR   Hemoglobin A1c     Other   Mixed hyperlipidemia    Lipid profile reviewed Last cardiology note reviewed, referred to lipid clinic Currently on statin, plan to start PCSK9 inhibitor therapy      Relevant Medications   amLODipine (NORVASC) 10 MG tablet   spironolactone (ALDACTONE) 50 MG tablet   Other Relevant Orders   Lipid Profile   Moderate episode of recurrent major depressive disorder (HCC)    Flowsheet Row Office Visit from 08/13/2023 in [facility]  PHQ-9 Total Score 7      Well-controlled On Cymbalta 60 mg daily        Meds ordered this encounter  Medications   amLODipine (NORVASC) 10 MG tablet    Sig: Take 1 tablet (10 mg total) by mouth daily.    Dispense:  90 tablet    Refill:  1   spironolactone (ALDACTONE) 50 MG tablet    Sig: Take 1 tablet (50 mg total) by mouth daily.    Dispense:  90 tablet    Refill:  1    Follow-up: Return in about 3 months (around 11/11/2023) for HTN and DM.    Anabel Halon, MD

## 2023-08-13 NOTE — Assessment & Plan Note (Addendum)
BP Readings from Last 1 Encounters:  08/13/23 (!) 164/70   Uncontrolled with Amlodipine 10 mg QD, Spironolactone 50 mg QD and Labetalol 100 mg BID - compliance is questionable Needs to restart Amlodipine and Spironolactone Counseled for compliance with the medications Advised DASH diet and moderate exercise/walking, at least 150 mins/week

## 2023-08-13 NOTE — Assessment & Plan Note (Signed)
Flowsheet Row Office Visit from 08/13/2023 in [facility]  PHQ-9 Total Score 7      Well-controlled On Cymbalta 60 mg daily

## 2023-08-13 NOTE — Assessment & Plan Note (Addendum)
Lab Results  Component Value Date   HGBA1C 8.4 (H) 05/08/2023   Uncontrolled, but improving On Metformin, Jardiance and Lantus 46 U QD, followed by endocrinology - needs follow up Advised to follow diabetic diet On statin and ACEi Diabetic eye exam: Advised to follow up with Ophthalmology for diabetic eye exam

## 2023-08-13 NOTE — Assessment & Plan Note (Signed)
Lipid profile reviewed Last cardiology note reviewed, referred to lipid clinic Currently on statin, plan to start PCSK9 inhibitor therapy 

## 2023-08-14 LAB — HEMOGLOBIN A1C
Est. average glucose Bld gHb Est-mCnc: 200 mg/dL
Hgb A1c MFr Bld: 8.6 % — ABNORMAL HIGH (ref 4.8–5.6)

## 2023-08-14 LAB — CMP14+EGFR
ALT: 12 [IU]/L (ref 0–32)
AST: 13 [IU]/L (ref 0–40)
Albumin: 4 g/dL (ref 3.8–4.9)
Alkaline Phosphatase: 130 [IU]/L — ABNORMAL HIGH (ref 44–121)
BUN/Creatinine Ratio: 13 (ref 9–23)
BUN: 11 mg/dL (ref 6–24)
Bilirubin Total: <0.2 mg/dL (ref 0.0–1.2)
CO2: 21 mmol/L (ref 20–29)
Calcium: 10.3 mg/dL — ABNORMAL HIGH (ref 8.7–10.2)
Chloride: 105 mmol/L (ref 96–106)
Creatinine, Ser: 0.84 mg/dL (ref 0.57–1.00)
Globulin, Total: 2.4 g/dL (ref 1.5–4.5)
Glucose: 261 mg/dL — ABNORMAL HIGH (ref 70–99)
Potassium: 4.3 mmol/L (ref 3.5–5.2)
Sodium: 141 mmol/L (ref 134–144)
Total Protein: 6.4 g/dL (ref 6.0–8.5)
eGFR: 81 mL/min/{1.73_m2} (ref >59)

## 2023-08-14 LAB — LIPID PANEL
Chol/HDL Ratio: 4.5 {ratio} — ABNORMAL HIGH (ref 0.0–4.4)
Cholesterol, Total: 213 mg/dL — ABNORMAL HIGH (ref 100–199)
HDL: 47 mg/dL (ref >39)
LDL Chol Calc (NIH): 147 mg/dL — ABNORMAL HIGH (ref 0–99)
Triglycerides: 103 mg/dL (ref 0–149)
VLDL Cholesterol Cal: 19 mg/dL (ref 5–40)

## 2023-08-17 DIAGNOSIS — L97522 Non-pressure chronic ulcer of other part of left foot with fat layer exposed: Secondary | ICD-10-CM | POA: Diagnosis not present

## 2023-08-20 DIAGNOSIS — Z79891 Long term (current) use of opiate analgesic: Secondary | ICD-10-CM | POA: Diagnosis not present

## 2023-08-22 ENCOUNTER — Ambulatory Visit: Payer: Medicaid Other | Admitting: Orthopaedic Surgery

## 2023-08-22 DIAGNOSIS — Z79899 Other long term (current) drug therapy: Secondary | ICD-10-CM | POA: Diagnosis not present

## 2023-08-31 DIAGNOSIS — M7989 Other specified soft tissue disorders: Secondary | ICD-10-CM | POA: Diagnosis not present

## 2023-09-06 ENCOUNTER — Other Ambulatory Visit: Payer: Self-pay | Admitting: Internal Medicine

## 2023-09-20 DIAGNOSIS — I70245 Atherosclerosis of native arteries of left leg with ulceration of other part of foot: Secondary | ICD-10-CM | POA: Diagnosis not present

## 2023-09-20 DIAGNOSIS — I872 Venous insufficiency (chronic) (peripheral): Secondary | ICD-10-CM | POA: Diagnosis not present

## 2023-09-20 DIAGNOSIS — L97529 Non-pressure chronic ulcer of other part of left foot with unspecified severity: Secondary | ICD-10-CM | POA: Diagnosis not present

## 2023-09-20 DIAGNOSIS — I739 Peripheral vascular disease, unspecified: Secondary | ICD-10-CM | POA: Diagnosis not present

## 2023-10-16 DIAGNOSIS — I70223 Atherosclerosis of native arteries of extremities with rest pain, bilateral legs: Secondary | ICD-10-CM | POA: Diagnosis not present

## 2023-10-16 DIAGNOSIS — M79669 Pain in unspecified lower leg: Secondary | ICD-10-CM | POA: Diagnosis not present

## 2023-10-21 DIAGNOSIS — I70245 Atherosclerosis of native arteries of left leg with ulceration of other part of foot: Secondary | ICD-10-CM | POA: Diagnosis not present

## 2023-10-21 DIAGNOSIS — L97528 Non-pressure chronic ulcer of other part of left foot with other specified severity: Secondary | ICD-10-CM | POA: Diagnosis not present

## 2023-11-12 ENCOUNTER — Ambulatory Visit: Payer: Medicaid Other | Admitting: Internal Medicine

## 2023-11-13 ENCOUNTER — Ambulatory Visit: Payer: Medicaid Other | Admitting: Gastroenterology

## 2023-11-26 ENCOUNTER — Ambulatory Visit (INDEPENDENT_AMBULATORY_CARE_PROVIDER_SITE_OTHER): Payer: Medicaid Other | Admitting: Internal Medicine

## 2023-11-26 VITALS — BP 130/74 | HR 84 | Ht 65.0 in | Wt 229.0 lb

## 2023-11-26 DIAGNOSIS — E1169 Type 2 diabetes mellitus with other specified complication: Secondary | ICD-10-CM | POA: Diagnosis not present

## 2023-11-26 DIAGNOSIS — E785 Hyperlipidemia, unspecified: Secondary | ICD-10-CM

## 2023-11-26 DIAGNOSIS — E1142 Type 2 diabetes mellitus with diabetic polyneuropathy: Secondary | ICD-10-CM

## 2023-11-26 DIAGNOSIS — E1165 Type 2 diabetes mellitus with hyperglycemia: Secondary | ICD-10-CM | POA: Diagnosis not present

## 2023-11-26 DIAGNOSIS — Z794 Long term (current) use of insulin: Secondary | ICD-10-CM

## 2023-11-26 LAB — POCT GLYCOSYLATED HEMOGLOBIN (HGB A1C): Hemoglobin A1C: 11.1 % — AB (ref 4.0–5.6)

## 2023-11-26 LAB — POCT GLUCOSE (DEVICE FOR HOME USE): POC Glucose: 353 mg/dL — AB (ref 70–99)

## 2023-11-26 MED ORDER — DEXCOM G7 SENSOR MISC: 1.0000 | 3 refills | Status: AC

## 2023-11-26 MED ORDER — INSULIN PEN NEEDLE 31G X 5 MM MISC: 1.0000 | Freq: Four times a day (QID) | 3 refills | Status: AC

## 2023-11-26 MED ORDER — ROSUVASTATIN CALCIUM 40 MG PO TABS: 40.0000 mg | ORAL_TABLET | Freq: Every day | ORAL | 3 refills | Status: DC

## 2023-11-26 MED ORDER — LANTUS SOLOSTAR 100 UNIT/ML ~~LOC~~ SOPN: 46.0000 [IU] | PEN_INJECTOR | Freq: Every day | SUBCUTANEOUS | 4 refills | Status: DC

## 2023-11-26 MED ORDER — METFORMIN HCL 1000 MG PO TABS
1000.0000 mg | ORAL_TABLET | Freq: Two times a day (BID) | ORAL | 3 refills | Status: DC
Start: 2023-11-26 — End: 2024-07-11

## 2023-11-26 MED ORDER — INSULIN LISPRO (1 UNIT DIAL) 100 UNIT/ML (KWIKPEN): 8.0000 [IU] | PEN_INJECTOR | Freq: Three times a day (TID) | SUBCUTANEOUS | 3 refills | Status: DC

## 2023-11-26 NOTE — Progress Notes (Signed)
 Name: Sonya James  MRN/ DOB: [id number], 08/27/1966   Age/ Sex: 58 y.o., female    PCP: Anabel Halon, MD   Reason for Endocrinology Evaluation: Type 2 Diabetes Mellitus     Date of Initial Endocrinology Visit: 09/02/2022    PATIENT IDENTIFIER: Sonya James is a 58 y.o. female with a past medical history of T2DM, HTN, Hx of endometrial cancer and Hx of Pancreatitis and dyslipidemia . The patient presented for initial endocrinology clinic visit on 09/02/2021  for consultative assistance with her diabetes management.    HPI: Sonya James was    Diagnosed with DM 2014 Prior Medications tried/Intolerance: Has been on jardiance without intolerance issues           Hemoglobin A1c has ranged from 5.9% in 2017, peaking at 12.2% in 2022.  She has dysphagia and thyromegaly . She had an ultrasound in 2005 that did not show any nodules at the time . TFT's normal    On her initial visit to our clinic she had an A1c 9.0 % , She was on Glipizide, Novolin Mix , and Metformin . We stopped Glipizide, Novolin Mix, started basal insulin, Jardiance and continued Metformin      SUBJECTIVE:   During the last visit (05/29/2023): A1c 8.4 %     Today (11/26/23): Sonya James is here for a follow up on diabetes management. She checks her  blood sugars 0 times daily. The patient has not had hypoglycemic episodes since the last clinic visit.   She was seen by podiatry 08/31/2023 by Dr. Merwyn Katos Patient had a follow-up with hematology for erythrocytosis, unprovoked pulmonary embolism as well as history of endometrial cancer  Denies nausea or vomiting Has chronic constipation, uses Linzess  Due to insurance issues she has no insurance card and has not been able to obtain CGM   HOME DIABETES REGIMEN: Metformin  1000 mg BID  Jardiance 25 mg daily  Lantus 46 units daily - takes 40     Statin: yes ACE-I/ARB: yes Prior Diabetic Education: yes - does not like going there    METER DOWNLOAD  SUMMARY: n/a   DIABETIC COMPLICATIONS: Microvascular complications:  Neuropathy Denies: CKD, retinopathy  Last eye exam: Completed 2023  Macrovascular complications:   Denies: CAD, CVA      PAST HISTORY: Past Medical History:  Past Medical History:  Diagnosis Date   Anemia    Anxiety    Arthritis    Asthma 05/25/2013   Autonomic neuropathy    Cancer of endometrium (HCC) 10/18/2015   Cellulitis, face 05/03/2021   Constipation    COPD (chronic obstructive pulmonary disease) (HCC)    CTS (carpal tunnel syndrome)    Depression    Diabetes mellitus without complication (HCC)    Dysphagia 12/14/2016   Encounter for screening colonoscopy    Endometrial cancer (HCC) 2017   Gastritis without bleeding    GERD (gastroesophageal reflux disease)    Gross hematuria 05/11/2019   Headache(784.0)    Heavy menses 10/11/2015   HTN (hypertension)    Hypercholesterolemia    Hypothyroidism    IBS (irritable bowel syndrome)    Osteoarthritis    osteoarthritis   Pancreatitis    per patient    Recurrent boils    buttocks and low back.   S/P laparoscopic hysterectomy 11/04/2015   Shortness of breath    Sleep apnea    CPAP machine   Uncontrolled type 2 diabetes mellitus with diabetic polyneuropathy, with long-term current use of  insulin 08/04/2017   Uterine cancer (HCC) 2017   Vitamin D deficiency    Past Surgical History:  Past Surgical History:  Procedure Laterality Date   ABDOMINAL HYSTERECTOMY  2018   endometrial cancer, [facility] done [facility]    BIOPSY  01/02/2017   Procedure: BIOPSY;  Surgeon: West Bali, MD;  Location: [facility];  Service: Endoscopy;;  gastric biopsy   CESAREAN SECTION  1989   COLONOSCOPY WITH PROPOFOL N/A 01/02/2017   Dr. Darrick Penna: redundant colon, two 2-3 mm polyps in sigmoid colon (hyperplastic)   EAR CYST EXCISION Right 11/29/2021   Procedure: EXCISION OF EXTERNAL EAR LESION;  Surgeon: Laren Boom, DO;  Location: [facility] OR;  Service:  ENT;  Laterality: Right;   ESOPHAGOGASTRODUODENOSCOPY  05/22/2011   Dr. Darrick Penna: H.pylori gastritis    ESOPHAGOGASTRODUODENOSCOPY (EGD) WITH PROPOFOL N/A 01/02/2017   Dr. Darrick Penna: possible web in proximal esophagus s/p dilation, moderate gastritis (negative H.pylori)   FOOT SURGERY Left    tendon repair   IR US GUIDE VASC ACCESS LEFT  01/18/2021   OOPHORECTOMY     OTOPLASATY Left 06/15/2021   Procedure: Excision of left auricular lesion with endaural meatoplasty;  Surgeon: Laren Boom, DO;  Location: [facility] OR;  Service: ENT;  Laterality: Left;   POLYPECTOMY  01/02/2017   Procedure: POLYPECTOMY;  Surgeon: West Bali, MD;  Location: [facility];  Service: Endoscopy;;  sigmoid colon polyps times 2   REDUCTION MAMMAPLASTY  1996   SAVORY DILATION N/A 01/02/2017   Procedure: SAVORY DILATION;  Surgeon: West Bali, MD;  Location: [facility];  Service: Endoscopy;  Laterality: N/A;   TUBAL LIGATION      Social History:  reports that she has been smoking cigarettes. She has a 33 pack-year smoking history. She has never used smokeless tobacco. She reports that she does not drink alcohol and does not use drugs. Family History:  Family History  Problem Relation Age of Onset   Diabetes Mother    Hypertension Mother    COPD Mother    Asthma Mother    Hypercholesterolemia Mother    Hypertension Sister    Hyperlipidemia Sister    Diabetes Sister    Asthma Brother    Alcohol abuse Brother    Asthma Son    Cancer Maternal Grandmother        lung, throat, breast   Alzheimer's disease Maternal Grandmother    Hypertension Maternal Grandmother    Hypercholesterolemia Maternal Grandmother    Heart disease Maternal Grandfather    Stroke Maternal Grandfather    Clotting disorder Maternal Grandfather        blood clots in legs   Hypertension Sister    Hyperlipidemia Sister    Stroke Maternal Aunt    Clotting disorder Maternal Aunt        blood clots in legs   Hypertension Maternal Aunt     Hypercholesterolemia Maternal Aunt    Hypertension Maternal Aunt    Asthma Maternal Aunt    Clotting disorder Maternal Uncle        blood clot -legs traveled to heart and he died of heart attack   Colon cancer Neg Hx      HOME MEDICATIONS: Allergies as of 11/26/2023       Reactions   Ibuprofen    dizziness   Lisinopril Swelling   Swelling in face        Medication List        Accurate as  of November 26, 2023  7:09 AM. If you have any questions, ask your nurse or doctor.          Accu-Chek Guide Me w/Device Kit 1 Piece by Does not apply route as directed.   Blood Glucose Monitoring Suppl Devi 1 each by Does not apply route in the morning, at noon, and at bedtime. May substitute to any manufacturer covered by patient's insurance.   Accu-Chek Guide test strip Generic drug: glucose blood Test glucose 4 times a day   Accu-Chek Softclix Lancets lancets To test glucose 4 times a day   albuterol 108 (90 Base) MCG/ACT inhaler Commonly known as: VENTOLIN HFA Inhale 2 puffs into the lungs every 6 (six) hours as needed for wheezing or shortness of breath.   albuterol (2.5 MG/3ML) 0.083% nebulizer solution Commonly known as: PROVENTIL Take 3 mLs (2.5 mg total) by nebulization every 6 (six) hours as needed for wheezing or shortness of breath.   amLODipine 10 MG tablet Commonly known as: NORVASC Take 1 tablet (10 mg total) by mouth daily.   apixaban 5 MG Tabs tablet Commonly known as: ELIQUIS Take 1 tablet (5 mg total) by mouth 2 (two) times daily. On 08/12/17 @ 9PM, start 1 tab (5 mg) two times daily.   atorvastatin 80 MG tablet Commonly known as: LIPITOR Take 1 tablet (80 mg total) by mouth daily.   clotrimazole 2 % vaginal cream Commonly known as: GYNE-LOTRIMIN 3 Place 1 Applicatorful vaginally at bedtime.   Dexcom G7 Sensor Misc 1 Device by Does not apply route as directed.   DULoxetine 60 MG capsule Commonly known as: CYMBALTA Take 1 capsule (60 mg total)  by mouth daily.   empagliflozin 25 MG Tabs tablet Commonly known as: Jardiance Take 1 tablet (25 mg total) by mouth daily before breakfast.   esomeprazole 40 MG capsule Commonly known as: NEXIUM TAKE 1 CAPSULE BY MOUTH TWICE DAILY BEFORE MEAL(S)   furosemide 40 MG tablet Commonly known as: LASIX Take 1 tablet by mouth once daily   Insulin Pen Needle 31G X 5 MM Misc 1 Device by Does not apply route daily in the afternoon.   labetalol 100 MG tablet Commonly known as: NORMODYNE Take 1 tablet (100 mg total) by mouth 2 (two) times daily.   Lantus SoloStar 100 UNIT/ML Solostar Pen Generic drug: insulin glargine Inject 46 Units into the skin daily.   linaclotide 290 MCG Caps capsule Commonly known as: Linzess TAKE 1 CAPSULE BY MOUTH ONCE DAILY BEFORE BREAKFAST   metFORMIN 1000 MG tablet Commonly known as: GLUCOPHAGE Take 1 tablet (1,000 mg total) by mouth 2 (two) times daily with a meal.   Misc. Devices Misc Blood pressure monitoring device   naloxone 4 MG/0.1ML Liqd nasal spray kit Commonly known as: NARCAN SMARTSIG:Spray(s) In Nostril   ondansetron 4 MG disintegrating tablet Commonly known as: ZOFRAN-ODT Take 1 tablet (4 mg total) by mouth every 6 (six) hours as needed for nausea or vomiting.   rizatriptan 10 MG tablet Commonly known as: MAXALT Take 10 mg by mouth daily as needed for migraine.   Spiriva Respimat 2.5 MCG/ACT Aers Generic drug: Tiotropium Bromide Monohydrate Inhale 2 puffs into the lungs daily.   spironolactone 50 MG tablet Commonly known as: ALDACTONE Take 1 tablet (50 mg total) by mouth daily.   traMADol 50 MG tablet Commonly known as: ULTRAM Take 50 mg by mouth 2 (two) times daily as needed.   Vitamin D (Ergocalciferol) 1.25 MG (50000 UNIT) Caps capsule Commonly known  as: DRISDOL Take 1 capsule by mouth once a week         ALLERGIES: Allergies  Allergen Reactions   Ibuprofen     dizziness   Lisinopril Swelling    Swelling in face         OBJECTIVE:   VITAL SIGNS: LMP 09/20/2015 (Exact Date)    PHYSICAL EXAM:  General: Pt appears well and is in NAD  Lungs: Clear with good BS bilat   Heart: RRR   Extremities:  Lower extremities - No pretibial edema. No lesions.  Neuro: MS is good with appropriate affect, pt is alert and Ox3   DM Foot Exam 08/31/2023 per podiatry     DATA REVIEWED:  Lab Results  Component Value Date   HGBA1C 8.6 (H) 08/13/2023   HGBA1C 8.4 (H) 05/08/2023   HGBA1C 10.1 (H) 01/18/2023     Latest Reference Range & Units 08/13/23 14:22  Sodium 134 - 144 mmol/L 141  Potassium 3.5 - 5.2 mmol/L 4.3  Chloride 96 - 106 mmol/L 105  CO2 20 - 29 mmol/L 21  Glucose 70 - 99 mg/dL 132 (H)  BUN 6 - 24 mg/dL 11  Creatinine 4.40 - 1.02 mg/dL 7.25  Calcium 8.7 - 36.6 mg/dL 44.0 (H)  BUN/Creatinine Ratio 9 - 23  13  eGFR >59 mL/min/1.73 81  Alkaline Phosphatase 44 - 121 IU/L 130 (H)  Albumin 3.8 - 4.9 g/dL 4.0  AST 0 - 40 IU/L 13  ALT 0 - 32 IU/L 12  Total Protein 6.0 - 8.5 g/dL 6.4  Total Bilirubin 0.0 - 1.2 mg/dL <3.4    Latest Reference Range & Units 08/13/23 14:22  Total CHOL/HDL Ratio 0.0 - 4.4 ratio 4.5 (H)  Cholesterol, Total 100 - 199 mg/dL 742 (H)  HDL Cholesterol >39 mg/dL 47  Triglycerides 0 - 595 mg/dL 638  VLDL Cholesterol Cal 5 - 40 mg/dL 19  LDL Chol Calc (NIH) 0 - 99 mg/dL 756 (H)  (H): Data is abnormally high    Thyroid Ultrasound 09/19/2021  Estimated total number of nodules >/= 1 cm: 0   Number of spongiform nodules >/=  2 cm not described below (TR1): 0   Number of mixed cystic and solid nodules >/= 1.5 cm not described below (TR2): 0   _________________________________________________________   Few scattered less than 5 mm nodules do not meet criteria for imaging surveillance.   IMPRESSION: No significant sonographic abnormality of the thyroid.     Old records , labs and images have been reviewed.     ASSESSMENT / PLAN / RECOMMENDATIONS:   1)  Type 2 Diabetes Mellitus, Poorly controlled, With neuropathic  complications - Most recent A1c of 11.1 %. Goal A1c < 7.0 %.    -A1c increased from 8.4% to 11.1% -She is NOT a candidate for GLP-1 agonist and DPP-4 inhibitors due to hx of pancreatitis  -Patient continues to drink sugar sweetened beverages, in office BG 353 Mg/DL after drinking regular soda, I again emphasized the importance of avoiding sugar sweetened beverages -We discussed microvascular complications of uncontrolled DM and the importance of following a low-carb diet -I question her compliance, because it took her a few minutes to remember how much Lantus she gives herself, eventually she told me she thinks she takes 40 units (of note, I increased this to 46 units last visit) -I will start her on prandial insulin as below to be taken with each meal -I am not going to give her correction scale at this  time as she is not using the Dexcom due to cost  MEDICATIONS: Continue Metformin 1000 mg Twice d ay  Continue Jardiance 25 mg daily  Increase Lantus 46 units daily Start Humalog 8 units before each meal 3 times daily  EDUCATION / INSTRUCTIONS: BG monitoring instructions: Patient is instructed to check her blood sugars 3 times a day, fasting . Call [facility] if: BG persistently < 70  I reviewed the Rule of 15 for the treatment of hypoglycemia in detail with the patient. Literature supplied.   2) Diabetic complications:  Eye: Does not have known diabetic retinopathy.  Neuro/ Feet: Does have known diabetic peripheral neuropathy. Renal: Patient does not have known baseline CKD. She is not on an ACEI/ARB at present.   3) Dyslipidemia :  -LDL elevated -She does not believe that she has been taking atorvastatin -We discussed the increased risk of CVA/CAD with uncontrolled lipids -I will prescribe rosuvastatin as below   Medication Stop atorvastatin 80 mg Start rosuvastatin 40 mg daily   Follow-up in 3  months   Signed electronically by: Lyndle Herrlich, MD  [facility]  [facility] [address] [place] [place], [place] [address] Phone: [phone] FAX: [phone]   CC: Anabel Halon, MD [address] [place] [address] Phone: [phone]  Fax: [phone]    Return to Endocrinology clinic as below: Future Appointments  Date Time Provider Department Center  11/26/2023  8:50 AM Tiffanye Hartmann, Konrad Dolores, MD [facility] None  01/15/2024  8:30 AM [facility] None  01/15/2024  9:30 AM Carnella Guadalajara, PA-C [facility] None

## 2023-11-26 NOTE — Patient Instructions (Addendum)
 Increase Lantus 46 units daily  Continue Metformin 1000 mg Twice day  Continue Jardiance 25 mg daily  Take Humalog 8 units with every meal     HOW TO TREAT LOW BLOOD SUGARS (Blood sugar LESS THAN 70 MG/DL) Please follow the RULE OF 15 for the treatment of hypoglycemia treatment (when your (blood sugars are less than 70 mg/dL)   STEP 1: Take 15 grams of carbohydrates when your blood sugar is low, which includes:  3-4 GLUCOSE TABS  OR 3-4 OZ OF JUICE OR REGULAR SODA OR ONE TUBE OF GLUCOSE GEL    STEP 2: RECHECK blood sugar in 15 MINUTES STEP 3: If your blood sugar is still low at the 15 minute recheck --> then, go back to STEP 1 and treat AGAIN with another 15 grams of carbohydrates.

## 2023-12-03 DIAGNOSIS — I70223 Atherosclerosis of native arteries of extremities with rest pain, bilateral legs: Secondary | ICD-10-CM | POA: Diagnosis not present

## 2023-12-03 DIAGNOSIS — M79669 Pain in unspecified lower leg: Secondary | ICD-10-CM | POA: Diagnosis not present

## 2023-12-05 ENCOUNTER — Telehealth: Payer: Self-pay | Admitting: Gastroenterology

## 2023-12-05 NOTE — Telephone Encounter (Signed)
 Patient had left a message asking for a return call to schedule an appt.  I called her, no answer and unable to leave a message

## 2023-12-09 DIAGNOSIS — I70221 Atherosclerosis of native arteries of extremities with rest pain, right leg: Secondary | ICD-10-CM | POA: Diagnosis not present

## 2023-12-19 ENCOUNTER — Ambulatory Visit (INDEPENDENT_AMBULATORY_CARE_PROVIDER_SITE_OTHER): Admitting: Orthopaedic Surgery

## 2023-12-19 ENCOUNTER — Encounter: Payer: Self-pay | Admitting: Orthopaedic Surgery

## 2023-12-19 DIAGNOSIS — M25561 Pain in right knee: Secondary | ICD-10-CM

## 2023-12-19 DIAGNOSIS — M25562 Pain in left knee: Secondary | ICD-10-CM | POA: Diagnosis not present

## 2023-12-19 DIAGNOSIS — G8929 Other chronic pain: Secondary | ICD-10-CM

## 2023-12-19 MED ORDER — METHYLPREDNISOLONE ACETATE 40 MG/ML IJ SUSP
40.0000 mg | Freq: Once | INTRAMUSCULAR | Status: AC
Start: 2023-12-19 — End: 2023-12-19
  Administered 2023-12-19: 40 mg via INTRA_ARTICULAR

## 2023-12-19 NOTE — Progress Notes (Signed)
 PROCEDURE NOTE:  The patient requests injections of the right knee , verbal consent was obtained.  The right knee was prepped appropriately after time out was performed.   Sterile technique was observed and injection of 1 cc of DepoMedrol 40mg  with several cc's of plain xylocaine. Anesthesia was provided by ethyl chloride and a 20-gauge needle was used to inject the knee area. The injection was tolerated well.  A band aid dressing was applied.  The patient was advised to apply ice later today and tomorrow to the injection sight as needed.  PROCEDURE NOTE:  The patient requests injections of the left knee , verbal consent was obtained.  The left knee was prepped appropriately after time out was performed.   Sterile technique was observed and injection of 1 cc of DepoMedrol 40 mg with several cc's of plain xylocaine. Anesthesia was provided by ethyl chloride and a 20-gauge needle was used to inject the knee area. The injection was tolerated well.  A band aid dressing was applied.  The patient was advised to apply ice later today and tomorrow to the injection sight as needed.  Encounter Diagnosis  Name Primary?   Chronic pain of both knees Yes   Return in six weeks.  Call if any problem.  Precautions discussed.  Electronically Signed Darreld Mclean, MD 4/9/202510:46 AM

## 2023-12-19 NOTE — Addendum Note (Signed)
 Addended by: Elvina Mattes T on: 12/19/2023 11:42 AM   Modules accepted: Orders

## 2023-12-20 DIAGNOSIS — M7989 Other specified soft tissue disorders: Secondary | ICD-10-CM | POA: Diagnosis not present

## 2023-12-26 ENCOUNTER — Ambulatory Visit: Admitting: Orthopaedic Surgery

## 2024-01-01 ENCOUNTER — Ambulatory Visit (INDEPENDENT_AMBULATORY_CARE_PROVIDER_SITE_OTHER): Admitting: Gastroenterology

## 2024-01-01 ENCOUNTER — Telehealth (INDEPENDENT_AMBULATORY_CARE_PROVIDER_SITE_OTHER): Payer: Self-pay | Admitting: Gastroenterology

## 2024-01-01 VITALS — BP 140/81 | HR 71 | Temp 98.0°F | Ht 65.0 in | Wt 227.6 lb

## 2024-01-01 DIAGNOSIS — K5900 Constipation, unspecified: Secondary | ICD-10-CM | POA: Diagnosis not present

## 2024-01-01 DIAGNOSIS — K219 Gastro-esophageal reflux disease without esophagitis: Secondary | ICD-10-CM | POA: Diagnosis not present

## 2024-01-01 DIAGNOSIS — R748 Abnormal levels of other serum enzymes: Secondary | ICD-10-CM | POA: Diagnosis not present

## 2024-01-01 MED ORDER — ESOMEPRAZOLE MAGNESIUM 40 MG PO CPDR: DELAYED_RELEASE_CAPSULE | ORAL | 3 refills | Status: AC

## 2024-01-01 MED ORDER — LINACLOTIDE 290 MCG PO CAPS: ORAL_CAPSULE | ORAL | 3 refills | Status: AC

## 2024-01-01 NOTE — Patient Instructions (Signed)
 I have refilled Nexium  and Linzess !  Please have blood work done. We are checking into the history of fatty liver.  I have also ordered an ultrasound of your liver.  Further recommendations to follow!  I enjoyed seeing you again today! I value our relationship and want to provide genuine, compassionate, and quality care. You may receive a survey regarding your visit with me, and I welcome your feedback! Thanks so much for taking the time to complete this. I look forward to seeing you again.      Delman Ferns, PhD, ANP-BC [facility]

## 2024-01-01 NOTE — Progress Notes (Signed)
 Gastroenterology Office Note     Primary Care Physician:  Meldon Sport, MD  Primary Gastroenterologist: Dr. Mordechai April    Chief Complaint   Chief Complaint  Patient presents with   Gastroesophageal Reflux    Follow up on GERD. Takes esomeprazole  and states it is working well.    Constipation    Follow up on constipation. Takes linzess  290 mcg daily. Has been out for awhile but worked well while taking it and would like to get back on medication.      History of Present Illness   Sonya James is a 58 y.o. female presenting today with a history of  constipation, dysphagia, and GERD. Remote history of pancreatitis in 2013 (CT with possible findings, lipase mildly elevated 111). CT with contrast May 2019 without any abnormalities. Did note mild hepatic steatosis. Last colonoscopy in 2018 with hyperplastic polyps.  She is here for yearly follow-up and refills.   GERD well-controlled on esomeprazole  40 mg BID. No abdominal pain, N/V, dysphagia, overt GI bleeding, diarrhea, changes in bowel habits. She is taking Linzess  290 mcg daily but ran out. This works well when she takes this.  Noted to have chronically isolated mildly elevated alk phos. Hep C negative in 2023.     Past Medical History:  Diagnosis Date   Anemia    Anxiety    Arthritis    Asthma 05/25/2013   Autonomic neuropathy    Cancer of endometrium (HCC) 10/18/2015   Cellulitis, face 05/03/2021   Constipation    COPD (chronic obstructive pulmonary disease) (HCC)    CTS (carpal tunnel syndrome)    Depression    Diabetes mellitus without complication (HCC)    Dysphagia 12/14/2016   Encounter for screening colonoscopy    Endometrial cancer (HCC) 2017   Gastritis without bleeding    GERD (gastroesophageal reflux disease)    Gross hematuria 05/11/2019   Headache(784.0)    Heavy menses 10/11/2015   HTN (hypertension)    Hypercholesterolemia    Hypothyroidism    IBS (irritable bowel syndrome)    Osteoarthritis     osteoarthritis   Pancreatitis    per patient    Recurrent boils    buttocks and low back.   S/P laparoscopic hysterectomy 11/04/2015   Shortness of breath    Sleep apnea    CPAP machine   Uncontrolled type 2 diabetes mellitus with diabetic polyneuropathy, with long-term current use of insulin  08/04/2017   Uterine cancer (HCC) 2017   Vitamin D  deficiency     Past Surgical History:  Procedure Laterality Date   ABDOMINAL HYSTERECTOMY  2018   endometrial cancer, surgery done at [facility]    BIOPSY  01/02/2017   Procedure: BIOPSY;  Surgeon: Alyce Jubilee, MD;  Location: [facility];  Service: Endoscopy;;  gastric biopsy   CESAREAN SECTION  1989   COLONOSCOPY WITH PROPOFOL  N/A 01/02/2017   Dr. Nolene Baumgarten: redundant colon, two 2-3 mm polyps in sigmoid colon (hyperplastic)   EAR CYST EXCISION Right 11/29/2021   Procedure: EXCISION OF EXTERNAL EAR LESION;  Surgeon: Daleen Dubs, DO;  Location: [facility] OR;  Service: ENT;  Laterality: Right;   ESOPHAGOGASTRODUODENOSCOPY  05/22/2011   Dr. Nolene Baumgarten: H.pylori gastritis    ESOPHAGOGASTRODUODENOSCOPY (EGD) WITH PROPOFOL  N/A 01/02/2017   Dr. Nolene Baumgarten: possible web in proximal esophagus s/p dilation, moderate gastritis (negative H.pylori)   FOOT SURGERY Left    tendon repair   IR US  GUIDE VASC ACCESS LEFT  01/18/2021  OOPHORECTOMY     OTOPLASATY Left 06/15/2021   Procedure: Excision of left auricular lesion with endaural meatoplasty;  Surgeon: Daleen Dubs, DO;  Location: [facility] OR;  Service: ENT;  Laterality: Left;   POLYPECTOMY  01/02/2017   Procedure: POLYPECTOMY;  Surgeon: Alyce Jubilee, MD;  Location: [facility];  Service: Endoscopy;;  sigmoid colon polyps times 2   REDUCTION MAMMAPLASTY  1996   SAVORY DILATION N/A 01/02/2017   Procedure: SAVORY DILATION;  Surgeon: Alyce Jubilee, MD;  Location: [facility];  Service: Endoscopy;  Laterality: N/A;   TUBAL LIGATION      Current Outpatient Medications  Medication Sig Dispense Refill    Accu-Chek Softclix Lancets lancets To test glucose 4 times a day 150 each 2   albuterol  (PROVENTIL ) (2.5 MG/3ML) 0.083% nebulizer solution Take 3 mLs (2.5 mg total) by nebulization every 6 (six) hours as needed for wheezing or shortness of breath. 150 mL 1   albuterol  (VENTOLIN  HFA) 108 (90 Base) MCG/ACT inhaler Inhale 2 puffs into the lungs every 6 (six) hours as needed for wheezing or shortness of breath. 18 g 5   amLODipine  (NORVASC ) 10 MG tablet Take 1 tablet (10 mg total) by mouth daily. 90 tablet 1   apixaban  (ELIQUIS ) 5 MG TABS tablet Take 1 tablet (5 mg total) by mouth 2 (two) times daily. On 08/12/17 @ 9PM, start 1 tab (5 mg) two times daily. 60 tablet 5   Blood Glucose Monitoring Suppl (ACCU-CHEK GUIDE ME) w/Device KIT 1 Piece by Does not apply route as directed. 1 kit 0   Blood Glucose Monitoring Suppl DEVI 1 each by Does not apply route in the morning, at noon, and at bedtime. May substitute to any manufacturer covered by patient's insurance. 1 each 0   clotrimazole  (GYNE-LOTRIMIN  3) 2 % vaginal cream Place 1 Applicatorful vaginally at bedtime. 21 g 0   Continuous Glucose Sensor (DEXCOM G7 SENSOR) MISC 1 Device by Does not apply route as directed. 9 each 3   DULoxetine  (CYMBALTA ) 60 MG capsule Take 1 capsule (60 mg total) by mouth daily. 90 capsule 1   empagliflozin  (JARDIANCE ) 25 MG TABS tablet Take 1 tablet (25 mg total) by mouth daily before breakfast. 90 tablet 3   esomeprazole  (NEXIUM ) 40 MG capsule TAKE 1 CAPSULE BY MOUTH TWICE DAILY BEFORE MEAL(S) 180 capsule 3   furosemide  (LASIX ) 40 MG tablet Take 1 tablet by mouth once daily 90 tablet 0   glucose blood (ACCU-CHEK GUIDE) test strip Test glucose 4 times a day 150 each 2   insulin  glargine (LANTUS  SOLOSTAR) 100 UNIT/ML Solostar Pen Inject 46 Units into the skin daily. 45 mL 4   insulin  lispro (HUMALOG  KWIKPEN) 100 UNIT/ML KwikPen Inject 8 Units into the skin 3 (three) times daily. 30 mL 3   Insulin  Pen Needle 31G X 5 MM MISC 1  Device by Does not apply route in the morning, at noon, in the evening, and at bedtime. 400 each 3   labetalol  (NORMODYNE ) 100 MG tablet Take 1 tablet (100 mg total) by mouth 2 (two) times daily. 180 tablet 1   linaclotide  (LINZESS ) 290 MCG CAPS capsule TAKE 1 CAPSULE BY MOUTH ONCE DAILY BEFORE BREAKFAST 90 capsule 3   metFORMIN  (GLUCOPHAGE ) 1000 MG tablet Take 1 tablet (1,000 mg total) by mouth 2 (two) times daily with a meal. 90 tablet 3   Misc. Devices MISC Blood pressure monitoring device 1 each 0   naloxone (NARCAN) nasal spray 4 mg/0.1  mL SMARTSIG:Spray(s) In Nostril     ondansetron  (ZOFRAN -ODT) 4 MG disintegrating tablet Take 1 tablet (4 mg total) by mouth every 6 (six) hours as needed for nausea or vomiting. 30 tablet 2   RELION PEN NEEDLES 31G X 6 MM MISC USE 1 PEN NEEDLE ONCE DAILY IN THE AFTERNOON     rizatriptan (MAXALT) 10 MG tablet Take 10 mg by mouth daily as needed for migraine.     rosuvastatin  (CRESTOR ) 40 MG tablet Take 1 tablet (40 mg total) by mouth daily. 90 tablet 3   spironolactone  (ALDACTONE ) 50 MG tablet Take 1 tablet (50 mg total) by mouth daily. 90 tablet 1   Tiotropium Bromide Monohydrate  (SPIRIVA  RESPIMAT) 2.5 MCG/ACT AERS Inhale 2 puffs into the lungs daily. 12 g 1   traMADol  (ULTRAM ) 50 MG tablet Take 50 mg by mouth 2 (two) times daily as needed.     Vitamin D , Ergocalciferol , (DRISDOL ) 1.25 MG (50000 UNIT) CAPS capsule Take 1 capsule by mouth once a week 12 capsule 1   No current facility-administered medications for this visit.    Allergies as of 01/01/2024 - Review Complete 01/01/2024  Allergen Reaction Noted   Ibuprofen   11/04/2022   Lisinopril  Swelling 04/03/2022    Family History  Problem Relation Age of Onset   Diabetes Mother    Hypertension Mother    COPD Mother    Asthma Mother    Hypercholesterolemia Mother    Hypertension Sister    Hyperlipidemia Sister    Diabetes Sister    Asthma Brother    Alcohol abuse Brother    Asthma Son     Cancer Maternal Grandmother        lung, throat, breast   Alzheimer's disease Maternal Grandmother    Hypertension Maternal Grandmother    Hypercholesterolemia Maternal Grandmother    Heart disease Maternal Grandfather    Stroke Maternal Grandfather    Clotting disorder Maternal Grandfather        blood clots in legs   Hypertension Sister    Hyperlipidemia Sister    Stroke Maternal Aunt    Clotting disorder Maternal Aunt        blood clots in legs   Hypertension Maternal Aunt    Hypercholesterolemia Maternal Aunt    Hypertension Maternal Aunt    Asthma Maternal Aunt    Clotting disorder Maternal Uncle        blood clot -legs traveled to heart and he died of heart attack   Colon cancer Neg Hx     Social History   Socioeconomic History   Marital status: Married    Spouse name: Not on file   Number of children: 1   Years of education: Not on file   Highest education level: Not on file  Occupational History   Not on file  Tobacco Use   Smoking status: Every Day    Current packs/day: 1.50    Average packs/day: 1.5 packs/day for 22.0 years (33.0 ttl pk-yrs)    Types: Cigarettes   Smokeless tobacco: Never  Vaping Use   Vaping status: Never Used  Substance and Sexual Activity   Alcohol use: No   Drug use: No   Sexual activity: Not Currently    Birth control/protection: Surgical    Comment: hysterectomy  Other Topics Concern   Not on file  Social History Narrative   Lives with husband and she has been married to for 9 years.  Recently got evicted and has been living in a motel  since last July.   She has 1 son that lives in [place].  Reports one grandbaby      Enjoys watching TV mostly animal [place].      Diet: Eats all food groups   Caffeine: Soda not diet, green tea, coffee about 3 cups of caffeine per day.   Water: Drinks 5-6 8 ounce bottles throughout the day.      Wears a seatbelt.  Currently does not have a vehicle.  Smoke detectors in the facility she is  living in.  Does not have any weapons.   Social Drivers of Health   Financial Resource Strain: Patient Declined (10/09/2022)   Received from [facility], [facility]   Overall Financial Resource Strain (CARDIA)    Difficulty of Paying Living Expenses: Patient declined  Food Insecurity: Patient Declined (10/09/2022)   Received from [facility], [facility]   Hunger Vital Sign    Worried About Running Out of Food in the Last Year: Patient declined    Ran Out of Food in the Last Year: Patient declined  Transportation Needs: Patient Declined (10/09/2022)   Received from [facility], [facility]   PRAPARE - Transportation    Lack of Transportation (Medical): Patient declined    Lack of Transportation (Non-Medical): Patient declined  Physical Activity: Insufficiently Active (04/06/2022)   Exercise Vital Sign    Days of Exercise per Week: 1 day    Minutes of Exercise per Session: 30 min  Stress: Stress Concern Present (04/06/2022)   [employer] of Occupational [employer] - Occupational Stress Questionnaire    Feeling of Stress : Rather much  Social Connections: Unknown (10/04/2022)   Received from [facility], [facility]   Social Network    Social Network: Not on file  Intimate Partner Violence: Unknown (10/04/2022)   Received from [facility], [facility]   HITS    Physically Hurt: Not on file    Insult or Talk Down To: Not on file    Threaten Physical Harm: Not on file    Scream or Curse: Not on file     Review of Systems   Gen: Denies any fever, chills, fatigue, weight loss, lack of appetite.  CV: Denies chest pain, heart palpitations, peripheral edema, syncope.  Resp: Denies shortness of breath at rest or with exertion. Denies wheezing or cough.  GI: Denies dysphagia or odynophagia. Denies jaundice, hematemesis, fecal incontinence. GU : Denies urinary burning, urinary frequency, urinary hesitancy MS: Denies joint pain, muscle weakness, cramps, or limitation  of movement.  Derm: Denies rash, itching, dry skin Psych: Denies depression, anxiety, memory loss, and confusion Heme: Denies bruising, bleeding, and enlarged lymph nodes.   Physical Exam   BP (!) 140/81   Pulse 71   Temp 98 F (36.7 C) (Oral)   Ht 5\' 5"  (1.651 m)   Wt 227 lb 9.6 oz (103.2 kg)   LMP 09/20/2015 (Exact Date)   BMI 37.87 kg/m  General:   Alert and oriented. Pleasant and cooperative. Well-nourished and well-developed.  Head:  Normocephalic and atraumatic. Eyes:  Without icterus Abdomen:  +BS, soft, non-tender and non-distended. No HSM noted. No guarding or rebound. No masses appreciated.  Rectal:  Deferred  Msk:  Symmetrical without gross deformities. Normal posture. Extremities:  Without edema. Neurologic:  Alert and  oriented x4;  grossly normal neurologically. Skin:  Intact without significant lesions or rashes. Psych:  Alert and cooperative. Normal mood and affect.   Assessment   Sonya James is a 58  y.o. female presenting today for yearly follow-up of chronic constipation and GERD.  GERD: well-controlled on Nexium  BID. Refills provided.   Constipation: well-controlled on Linzess  290 mcg daily. Refills provided. Colonoscopy due in 2028.   Elevated alk phos: mild isolated elevation chronically. Hep C negative in 2023. Known hepatic steatosis. Will check further serologies. Update US .      PLAN   Refilled Linzess  and Nexium  Labs: CBC, CMP, ELF, celiac serologies, AMA, GGT, alk phos isoenzymes US  abdomen Further recommendations to follow    Delman Ferns, PhD, ANP-BC [facility]

## 2024-01-01 NOTE — Telephone Encounter (Signed)
 Tried to attempt pt to give her US  appt but voicemail is full. Pt US  scheduled for 01/16/24 at 8:30am. Pt to arrive at 8:15am. NPO after midnight.

## 2024-01-03 ENCOUNTER — Encounter: Payer: Self-pay | Admitting: Internal Medicine

## 2024-01-03 ENCOUNTER — Telehealth: Payer: Self-pay | Admitting: Gastroenterology

## 2024-01-03 ENCOUNTER — Ambulatory Visit (INDEPENDENT_AMBULATORY_CARE_PROVIDER_SITE_OTHER): Payer: Self-pay | Admitting: Internal Medicine

## 2024-01-03 VITALS — BP 164/72 | HR 63 | Resp 16 | Ht 65.0 in | Wt 229.0 lb

## 2024-01-03 DIAGNOSIS — Z794 Long term (current) use of insulin: Secondary | ICD-10-CM

## 2024-01-03 DIAGNOSIS — I739 Peripheral vascular disease, unspecified: Secondary | ICD-10-CM | POA: Insufficient documentation

## 2024-01-03 DIAGNOSIS — I2782 Chronic pulmonary embolism: Secondary | ICD-10-CM

## 2024-01-03 DIAGNOSIS — E1142 Type 2 diabetes mellitus with diabetic polyneuropathy: Secondary | ICD-10-CM | POA: Diagnosis not present

## 2024-01-03 DIAGNOSIS — E782 Mixed hyperlipidemia: Secondary | ICD-10-CM

## 2024-01-03 DIAGNOSIS — I10 Essential (primary) hypertension: Secondary | ICD-10-CM

## 2024-01-03 DIAGNOSIS — F331 Major depressive disorder, recurrent, moderate: Secondary | ICD-10-CM | POA: Diagnosis not present

## 2024-01-03 DIAGNOSIS — R232 Flushing: Secondary | ICD-10-CM

## 2024-01-03 MED ORDER — VEOZAH 45 MG PO TABS
1.0000 | ORAL_TABLET | Freq: Every day | ORAL | 5 refills | Status: DC
Start: 2024-01-03 — End: 2024-05-30

## 2024-01-03 NOTE — Assessment & Plan Note (Addendum)
 BP Readings from Last 1 Encounters:  01/03/24 (!) 164/72   Uncontrolled with Amlodipine  10 mg QD, Spironolactone  50 mg QD and Labetalol  100 mg BID - compliance is questionable Needs to restart Amlodipine , labetalol  and Spironolactone  Counseled for compliance with the medications Advised DASH diet and moderate exercise/walking, at least 150 mins/week

## 2024-01-03 NOTE — Telephone Encounter (Signed)
 Pt called about paperwork needing to be done called pt back and left voicemail

## 2024-01-03 NOTE — Telephone Encounter (Signed)
 Hey  Pt Sonya James called her regarding an appt to be made for her regarding an US . Did Tanya schedule this pt and not advise her because Antony Baumgartner has not sent me anything.

## 2024-01-03 NOTE — Assessment & Plan Note (Addendum)
 Due to smoking history, not a candidate for HRT Has tried SSRI without much relief Started Veozah 

## 2024-01-03 NOTE — Patient Instructions (Signed)
 Please start taking Amlodipine , Spironolactone  and Labetalol  for blood pressure regularly.  Please start taking Veozah  for hot flashes.  Please continue to take other medications as prescribed.  Please continue to follow low carb diet and perform moderate exercise/walking at least 150 mins/week.  Please get fasting blood tests done before the next visit.

## 2024-01-03 NOTE — Assessment & Plan Note (Signed)
Lipid profile reviewed Last cardiology note reviewed, referred to lipid clinic Currently on statin, plan to start PCSK9 inhibitor therapy 

## 2024-01-03 NOTE — Progress Notes (Signed)
 Established Patient Office Visit  Subjective:  Patient ID: Sonya James, female    DOB: 07/20/1966  Age: 58 y.o. MRN: [id number]  CC:  Chief Complaint  Patient presents with   Hypertension    Follow up visit    Neck Pain    Still having pain and crunching feeling in the back of her neck. Took PT last year for it but the pain never went away and the only thing that helps is BC powder. Tylenol  doesn't help     HPI Sonya James is a 58 y.o. female with past medical history of HTN, type II DM, HLD, COPD, OSA, PE, GERD, obesity and tobacco abuse who presents for f/u of her chronic medical conditions.  HTN: Her BP is elevated today. She still takes amlodipine  10 mg QD, spironolactone  50 mg QD and labetalol  100 mg BID, but compliance is questionable. She has not taken amlodipine , labetalol  and spironolactone  today as she felt anxious after talking to her mother.  She denies any chest pain or palpitations.  She has occipital area headache, radiating towards her neck area.  She has tried taking BC powder for it.  Type II DM with HLD: Her HbA1c has worsened to 11.1 in 03/25. She is on Lantus  46 units, Metformin  1000 mg BID and Jardiance  25 mg QD.  She has been placed on Humalog  8 units 3 times daily recently, but she is noncompliant.  She takes Lipitor for HLD.  She complains of dyspnea and wheezing intermittently, and has been using albuterol  inhaler for her COPD. Denies any hemoptysis.  MDD: She has felt better with Cymbalta . She had anxiety, apathy, crying spells and decreased concentration, which is chronic.  She has moved out of her mother's home as she was stressing her out.  She gets anxious when her  mother tries to call her. Denies any SI or HI currently.   Past Medical History:  Diagnosis Date   Anemia    Anxiety    Arthritis    Asthma 05/25/2013   Autonomic neuropathy    Cancer of endometrium (HCC) 10/18/2015   Cellulitis, face 05/03/2021   Constipation    COPD (chronic  obstructive pulmonary disease) (HCC)    CTS (carpal tunnel syndrome)    Depression    Diabetes mellitus without complication (HCC)    Dysphagia 12/14/2016   Encounter for screening colonoscopy    Endometrial cancer (HCC) 2017   Gastritis without bleeding    GERD (gastroesophageal reflux disease)    Gross hematuria 05/11/2019   Headache(784.0)    Heavy menses 10/11/2015   HTN (hypertension)    Hypercholesterolemia    Hypothyroidism    IBS (irritable bowel syndrome)    Osteoarthritis    osteoarthritis   Pancreatitis    per patient    Recurrent boils    buttocks and low back.   S/P laparoscopic hysterectomy 11/04/2015   Shortness of breath    Sleep apnea    CPAP machine   Uncontrolled type 2 diabetes mellitus with diabetic polyneuropathy, with long-term current use of insulin  08/04/2017   Uterine cancer (HCC) 2017   Vitamin D  deficiency     Past Surgical History:  Procedure Laterality Date   ABDOMINAL HYSTERECTOMY  2018   endometrial cancer, surgery done at [facility]    BIOPSY  01/02/2017   Procedure: BIOPSY;  Surgeon: Alyce Jubilee, MD;  Location: [facility];  Service: Endoscopy;;  gastric biopsy   CESAREAN SECTION  1989  COLONOSCOPY WITH PROPOFOL  N/A 01/02/2017   Dr. Nolene Baumgarten: redundant colon, two 2-3 mm polyps in sigmoid colon (hyperplastic)   EAR CYST EXCISION Right 11/29/2021   Procedure: EXCISION OF EXTERNAL EAR LESION;  Surgeon: Daleen Dubs, DO;  Location: [facility] OR;  Service: ENT;  Laterality: Right;   ESOPHAGOGASTRODUODENOSCOPY  05/22/2011   Dr. Nolene Baumgarten: H.pylori gastritis    ESOPHAGOGASTRODUODENOSCOPY (EGD) WITH PROPOFOL  N/A 01/02/2017   Dr. Nolene Baumgarten: possible web in proximal esophagus s/p dilation, moderate gastritis (negative H.pylori)   FOOT SURGERY Left    tendon repair   IR US  GUIDE VASC ACCESS LEFT  01/18/2021   OOPHORECTOMY     OTOPLASATY Left 06/15/2021   Procedure: Excision of left auricular lesion with endaural meatoplasty;  Surgeon: Daleen Dubs, DO;  Location: [facility];  Service: ENT;  Laterality: Left;   POLYPECTOMY  01/02/2017   Procedure: POLYPECTOMY;  Surgeon: Alyce Jubilee, MD;  Location: [facility];  Service: Endoscopy;;  sigmoid colon polyps times 2   REDUCTION MAMMAPLASTY  1996   SAVORY DILATION N/A 01/02/2017   Procedure: SAVORY DILATION;  Surgeon: Alyce Jubilee, MD;  Location: [facility];  Service: Endoscopy;  Laterality: N/A;   TUBAL LIGATION      Family History  Problem Relation Age of Onset   Diabetes Mother    Hypertension Mother    COPD Mother    Asthma Mother    Hypercholesterolemia Mother    Hypertension Sister    Hyperlipidemia Sister    Diabetes Sister    Asthma Brother    Alcohol abuse Brother    Asthma Son    Cancer Maternal Grandmother        lung, throat, breast   Alzheimer's disease Maternal Grandmother    Hypertension Maternal Grandmother    Hypercholesterolemia Maternal Grandmother    Heart disease Maternal Grandfather    Stroke Maternal Grandfather    Clotting disorder Maternal Grandfather        blood clots in legs   Hypertension Sister    Hyperlipidemia Sister    Stroke Maternal Aunt    Clotting disorder Maternal Aunt        blood clots in legs   Hypertension Maternal Aunt    Hypercholesterolemia Maternal Aunt    Hypertension Maternal Aunt    Asthma Maternal Aunt    Clotting disorder Maternal Uncle        blood clot -legs traveled to heart and he died of heart attack   Colon cancer Neg Hx     Social History   Socioeconomic History   Marital status: Married    Spouse name: Not on file   Number of children: 1   Years of education: Not on file   Highest education level: Not on file  Occupational History   Not on file  Tobacco Use   Smoking status: Every Day    Current packs/day: 1.50    Average packs/day: 1.5 packs/day for 22.0 years (33.0 ttl pk-yrs)    Types: Cigarettes   Smokeless tobacco: Never  Vaping Use   Vaping status: Never Used  Substance and  Sexual Activity   Alcohol use: No   Drug use: No   Sexual activity: Not Currently    Birth control/protection: Surgical    Comment: hysterectomy  Other Topics Concern   Not on file  Social History Narrative   Lives with husband and she has been married to for 9 years.  Recently got evicted and has been living in  a motel since last July.   She has 1 son that lives in [place].  Reports one grandbaby      Enjoys watching TV mostly animal [place].      Diet: Eats all food groups   Caffeine: Soda not diet, green tea, coffee about 3 cups of caffeine per day.   Water: Drinks 5-6 8 ounce bottles throughout the day.      Wears a seatbelt.  Currently does not have a vehicle.  Smoke detectors in the facility she is living in.  Does not have any weapons.   Social Drivers of Health   Financial Resource Strain: Patient Declined (10/09/2022)   Received from [facility], [facility]   Overall Financial Resource Strain (CARDIA)    Difficulty of Paying Living Expenses: Patient declined  Food Insecurity: Patient Declined (10/09/2022)   Received from [facility], [facility]   Hunger Vital Sign    Worried About Running Out of Food in the Last Year: Patient declined    Ran Out of Food in the Last Year: Patient declined  Transportation Needs: Patient Declined (10/09/2022)   Received from [facility], [facility]   PRAPARE - Transportation    Lack of Transportation (Medical): Patient declined    Lack of Transportation (Non-Medical): Patient declined  Physical Activity: Insufficiently Active (04/06/2022)   Exercise Vital Sign    Days of Exercise per Week: 1 day    Minutes of Exercise per Session: 30 min  Stress: Stress Concern Present (04/06/2022)   [employer] of Occupational Health - Occupational Stress Questionnaire    Feeling of Stress : Rather much  Social Connections: Unknown (10/04/2022)   Received from [facility], [facility]   Social Network    Social Network: Not  on file  Intimate Partner Violence: Unknown (10/04/2022)   Received from [employer], [facility]   HITS    Physically Hurt: Not on file    Insult or Talk Down To: Not on file    Threaten Physical Harm: Not on file    Scream or Curse: Not on file    Outpatient Medications Prior to Visit  Medication Sig Dispense Refill   Accu-Chek Softclix Lancets lancets To test glucose 4 times a day 150 each 2   albuterol  (PROVENTIL ) (2.5 MG/3ML) 0.083% nebulizer solution Take 3 mLs (2.5 mg total) by nebulization every 6 (six) hours as needed for wheezing or shortness of breath. 150 mL 1   albuterol  (VENTOLIN  HFA) 108 (90 Base) MCG/ACT inhaler Inhale 2 puffs into the lungs every 6 (six) hours as needed for wheezing or shortness of breath. 18 g 5   amLODipine  (NORVASC ) 10 MG tablet Take 1 tablet (10 mg total) by mouth daily. 90 tablet 1   apixaban  (ELIQUIS ) 5 MG TABS tablet Take 1 tablet (5 mg total) by mouth 2 (two) times daily. On 08/12/17 @ 9PM, start 1 tab (5 mg) two times daily. 60 tablet 5   Blood Glucose Monitoring Suppl (ACCU-CHEK GUIDE ME) w/Device KIT 1 Piece by Does not apply route as directed. 1 kit 0   Blood Glucose Monitoring Suppl DEVI 1 each by Does not apply route in the morning, at noon, and at bedtime. May substitute to any manufacturer covered by patient's insurance. 1 each 0   clotrimazole  (GYNE-LOTRIMIN  3) 2 % vaginal cream Place 1 Applicatorful vaginally at bedtime. 21 g 0   Continuous Glucose Sensor (DEXCOM G7 SENSOR) MISC 1 Device by Does not apply route as directed. 9 each 3  DULoxetine  (CYMBALTA ) 60 MG capsule Take 1 capsule (60 mg total) by mouth daily. 90 capsule 1   empagliflozin  (JARDIANCE ) 25 MG TABS tablet Take 1 tablet (25 mg total) by mouth daily before breakfast. 90 tablet 3   esomeprazole  (NEXIUM ) 40 MG capsule TAKE 1 CAPSULE BY MOUTH TWICE DAILY BEFORE MEAL(S) 180 capsule 3   furosemide  (LASIX ) 40 MG tablet Take 1 tablet by mouth once daily 90 tablet 0   glucose  blood (ACCU-CHEK GUIDE) test strip Test glucose 4 times a day 150 each 2   insulin  glargine (LANTUS  SOLOSTAR) 100 UNIT/ML Solostar Pen Inject 46 Units into the skin daily. 45 mL 4   insulin  lispro (HUMALOG  KWIKPEN) 100 UNIT/ML KwikPen Inject 8 Units into the skin 3 (three) times daily. 30 mL 3   Insulin  Pen Needle 31G X 5 MM MISC 1 Device by Does not apply route in the morning, at noon, in the evening, and at bedtime. 400 each 3   labetalol  (NORMODYNE ) 100 MG tablet Take 1 tablet (100 mg total) by mouth 2 (two) times daily. 180 tablet 1   linaclotide  (LINZESS ) 290 MCG CAPS capsule TAKE 1 CAPSULE BY MOUTH ONCE DAILY BEFORE BREAKFAST 90 capsule 3   metFORMIN  (GLUCOPHAGE ) 1000 MG tablet Take 1 tablet (1,000 mg total) by mouth 2 (two) times daily with a meal. 90 tablet 3   Misc. Devices MISC Blood pressure monitoring device 1 each 0   naloxone (NARCAN) nasal spray 4 mg/0.1 mL SMARTSIG:Spray(s) In Nostril     ondansetron  (ZOFRAN -ODT) 4 MG disintegrating tablet Take 1 tablet (4 mg total) by mouth every 6 (six) hours as needed for nausea or vomiting. 30 tablet 2   RELION PEN NEEDLES 31G X 6 MM MISC USE 1 PEN NEEDLE ONCE DAILY IN THE AFTERNOON     rizatriptan (MAXALT) 10 MG tablet Take 10 mg by mouth daily as needed for migraine.     rosuvastatin  (CRESTOR ) 40 MG tablet Take 1 tablet (40 mg total) by mouth daily. 90 tablet 3   spironolactone  (ALDACTONE ) 50 MG tablet Take 1 tablet (50 mg total) by mouth daily. 90 tablet 1   Tiotropium Bromide Monohydrate  (SPIRIVA  RESPIMAT) 2.5 MCG/ACT AERS Inhale 2 puffs into the lungs daily. 12 g 1   traMADol  (ULTRAM ) 50 MG tablet Take 50 mg by mouth 2 (two) times daily as needed.     Vitamin D , Ergocalciferol , (DRISDOL ) 1.25 MG (50000 UNIT) CAPS capsule Take 1 capsule by mouth once a week 12 capsule 1   No facility-administered medications prior to visit.    Allergies  Allergen Reactions   Ibuprofen      dizziness   Lisinopril  Swelling    Swelling in face     ROS Review of Systems  Constitutional:  Negative for chills and fever.  HENT:  Negative for congestion, sinus pressure, sinus pain and sore throat.   Eyes:  Negative for pain and discharge.  Respiratory:  Negative for cough, shortness of breath and wheezing.   Cardiovascular:  Negative for chest pain and palpitations.  Gastrointestinal:  Negative for abdominal pain, diarrhea, nausea and vomiting.  Endocrine: Negative for polydipsia and polyuria.       Hot flashes  Genitourinary:  Negative for dysuria and hematuria.  Musculoskeletal:  Positive for arthralgias, back pain and neck pain. Negative for neck stiffness.  Skin:  Negative for rash.  Neurological:  Positive for headaches. Negative for dizziness and weakness.  Psychiatric/Behavioral:  Negative for agitation and behavioral problems.  Objective:    Physical Exam Vitals reviewed.  Constitutional:      General: She is not in acute distress.    Appearance: She is obese. She is not diaphoretic.  HENT:     Head: Normocephalic and atraumatic.     Nose: Nose normal. No congestion.     Mouth/Throat:     Mouth: Mucous membranes are moist.     Pharynx: No posterior oropharyngeal erythema.  Eyes:     General: No scleral icterus.    Extraocular Movements: Extraocular movements intact.  Neck:     Vascular: No carotid bruit.  Cardiovascular:     Rate and Rhythm: Normal rate and regular rhythm.     Pulses: Normal pulses.     Heart sounds: Murmur (Systolic over upper sternal border) heard.  Pulmonary:     Breath sounds: Normal breath sounds. No rales.  Musculoskeletal:     Cervical back: Neck supple. Tenderness present.     Right lower leg: No edema.     Left lower leg: No edema.  Skin:    General: Skin is warm.     Findings: No rash.  Neurological:     General: No focal deficit present.     Mental Status: She is alert and oriented to person, place, and time.     Sensory: No sensory deficit.     Motor: No weakness.   Psychiatric:        Mood and Affect: Mood is anxious.        Behavior: Behavior normal.     BP (!) 164/72 (BP Location: Left Arm)   Pulse 63   Resp 16   Ht 5\' 5"  (1.651 m)   Wt 229 lb (103.9 kg)   LMP 09/20/2015 (Exact Date)   SpO2 95%   BMI 38.11 kg/m  Wt Readings from Last 3 Encounters:  01/03/24 229 lb (103.9 kg)  01/01/24 227 lb 9.6 oz (103.2 kg)  11/26/23 229 lb (103.9 kg)    Lab Results  Component Value Date   TSH 1.670 05/08/2023   Lab Results  Component Value Date   WBC 9.0 06/19/2023   HGB 16.2 (H) 06/19/2023   HCT 49.2 (H) 06/19/2023   MCV 94.1 06/19/2023   PLT 335 06/19/2023   Lab Results  Component Value Date   NA 141 08/13/2023   K 4.3 08/13/2023   CO2 21 08/13/2023   GLUCOSE 261 (H) 08/13/2023   BUN 11 08/13/2023   CREATININE 0.84 08/13/2023   BILITOT <0.2 08/13/2023   ALKPHOS 130 (H) 08/13/2023   AST 13 08/13/2023   ALT 12 08/13/2023   PROT 6.4 08/13/2023   ALBUMIN 4.0 08/13/2023   CALCIUM  10.3 (H) 08/13/2023   ANIONGAP 8 02/21/2023   EGFR 81 08/13/2023   Lab Results  Component Value Date   CHOL 213 (H) 08/13/2023   Lab Results  Component Value Date   HDL 47 08/13/2023   Lab Results  Component Value Date   LDLCALC 147 (H) 08/13/2023   Lab Results  Component Value Date   TRIG 103 08/13/2023   Lab Results  Component Value Date   CHOLHDL 4.5 (H) 08/13/2023   Lab Results  Component Value Date   HGBA1C 11.1 (A) 11/26/2023      Assessment & Plan:   Problem List Items Addressed This Visit       Cardiovascular and Mediastinum   Essential hypertension - Primary   BP Readings from Last 1 Encounters:  01/03/24 (!) 164/72  Uncontrolled with Amlodipine  10 mg QD, Spironolactone  50 mg QD and Labetalol  100 mg BID - compliance is questionable Needs to restart Amlodipine , labetalol  and Spironolactone  Counseled for compliance with the medications Advised DASH diet and moderate exercise/walking, at least 150 mins/week       Pulmonary embolism (HCC)   Unprovoked On Eliquis  5 mg BID Followed by heme-onc.      Hot flashes   Due to smoking history, not a candidate for HRT Has tried SSRI without much relief Started Veozah        Relevant Medications   Fezolinetant  (VEOZAH ) 45 MG TABS   PVD (peripheral vascular disease) (HCC)   Followed by [facility] vascular surgery Recently had vascular procedures, but details not available for review She is currently on Eliquis  for history of unprovoked PE, needs to be compliant        Endocrine   Type 2 diabetes mellitus with diabetic polyneuropathy, with long-term current use of insulin  (HCC)   Lab Results  Component Value Date   HGBA1C 11.1 (A) 11/26/2023   Uncontrolled, recently increased dose of Lantus  to 46 units nightly - now improving although she has not brought blood glucose log or meter On Metformin , Jardiance  and Lantus  46 U with Humalog  8 units 3 times daily, followed by endocrinology -noncompliant Advised to follow diabetic diet On statin and ACEi Diabetic eye exam: Advised to follow up with Ophthalmology for diabetic eye exam  Continue Cymbalta  for neuropathy        Other   Mixed hyperlipidemia   Lipid profile reviewed Last cardiology note reviewed, referred to lipid clinic Currently on statin, plan to start PCSK9 inhibitor therapy      Moderate episode of recurrent major depressive disorder (HCC)   Flowsheet Row Office Visit from 01/03/2024 in [facility]  PHQ-9 Total Score 16      Uncontrolled, but has variability due to stress from her mother On Cymbalta  60 mg daily        Meds ordered this encounter  Medications   Fezolinetant  (VEOZAH ) 45 MG TABS    Sig: Take 1 tablet (45 mg total) by mouth daily.    Dispense:  30 tablet    Refill:  5    Follow-up: Return in about 4 weeks (around 01/31/2024) for HTN and DM.    Meldon Sport, MD

## 2024-01-03 NOTE — Assessment & Plan Note (Signed)
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 

## 2024-01-03 NOTE — Assessment & Plan Note (Signed)
 Followed by [facility] vascular surgery Recently had vascular procedures, but details not available for review She is currently on Eliquis  for history of unprovoked PE, needs to be compliant

## 2024-01-03 NOTE — Assessment & Plan Note (Signed)
 Flowsheet Row Office Visit from 01/03/2024 in [facility]  PHQ-9 Total Score 16      Uncontrolled, but has variability due to stress from her mother On Cymbalta  60 mg daily

## 2024-01-03 NOTE — Telephone Encounter (Signed)
 Sonya James ordered an ultrasound at her appt on 4/22.  Patient calling to check status.

## 2024-01-03 NOTE — Assessment & Plan Note (Addendum)
 Lab Results  Component Value Date   HGBA1C 11.1 (A) 11/26/2023   Uncontrolled, recently increased dose of Lantus  to 46 units nightly - now improving although she has not brought blood glucose log or meter On Metformin , Jardiance  and Lantus  46 U with Humalog  8 units 3 times daily, followed by endocrinology -noncompliant Advised to follow diabetic diet On statin and ACEi Diabetic eye exam: Advised to follow up with Ophthalmology for diabetic eye exam  Continue Cymbalta  for neuropathy

## 2024-01-04 ENCOUNTER — Other Ambulatory Visit (HOSPITAL_COMMUNITY): Payer: Self-pay

## 2024-01-04 ENCOUNTER — Encounter (INDEPENDENT_AMBULATORY_CARE_PROVIDER_SITE_OTHER): Payer: Self-pay

## 2024-01-04 ENCOUNTER — Telehealth: Payer: Self-pay | Admitting: Pharmacy Technician

## 2024-01-04 NOTE — Telephone Encounter (Signed)
 Pt voicemail is full. Appt letter sent to pt

## 2024-01-04 NOTE — Telephone Encounter (Signed)
 Pharmacy Patient Advocate Encounter   Received notification from CoverMyMeds that prior authorization for Veozah  45MG  tablets is required/requested.   Insurance verification completed.   The patient is insured through [facility] MEDICAID .   Per test claim: PA required; PA submitted to above mentioned insurance via CoverMyMeds Key/confirmation #/EOC [id number] Status is pending

## 2024-01-04 NOTE — Telephone Encounter (Signed)
 Pharmacy Patient Advocate Encounter  Received notification from [facility] MEDICAID that Prior Authorization for Veozah  45MG  tablets has been APPROVED from 01/04/2024 to 01/03/2025. Ran test claim, Copay is $4.00. This test claim was processed through [facility]- copay amounts may vary at other pharmacies due to pharmacy/plan contracts, or as the patient moves through the different stages of their insurance plan.   PA #/Case ID/Reference #: [id number]

## 2024-01-04 NOTE — Telephone Encounter (Signed)
 Please see telephone message from 01/01/24.

## 2024-01-14 NOTE — Progress Notes (Unsigned)
 [facility] [address], [place] [address]   CLINIC:  Medical Oncology/Hematology  PCP:  Meldon Sport, MD [address] [place] [address] [phone]   REASON FOR VISIT:  Follow-up for unprovoked pulmonary embolism   CURRENT THERAPY: Eliquis   INTERVAL HISTORY:   [name] 58 y.o. female returns for routine follow-up of her unprovoked PE on chronic anticoagulation.  She was last seen by Sheril Dines PA-C on 02/21/2023.  At today's visit, she reports feeling fair.  She had the arteries in her legs "unclogged" earlier this year.   She has multiple chronic complaints unrelated to today's visit, further detailed in ROS below.   No other recent hospitalizations, surgeries, or changes in baseline health status.  She is taking Eliquis  5 mg twice daily.  She has not had any major bleeding issues such as hematemesis, hematochezia, melena, or epistaxis.    She denies any falls or head strikes within the past year.    She has not had any interval DVT or PE since her last visit. She denies any unilateral leg swelling, pain, and erythema. She has some shortness of breath and dyspnea on exertion secondary to COPD and asthma, which is at baseline. She denies any new cough, chest pain, hemoptysis, or palpitations. She denies any fevers, chills, night sweats, unintentional weight loss. She denies any aquagenic pruritus or vasomotor symptoms. She continues to smoke 0.5 PPD cigarettes daily.  She is noncompliant with her CPAP.  She has 75% energy and 75% appetite. She endorses that she is maintaining a stable weight.  ASSESSMENT & PLAN:  1.  Unprovoked pulmonary embolism, on chronic anticoagulation: - Diagnosed in November 2018, on Eliquis  5 mg twice daily, without any bleeding complications. - She did not have any risk factors for pulmonary embolism. - Her blood work was negative for lupus anticoagulant, anticardiolipin antibodies, antibeta-2 glycoprotein  1 antibodies.  It was also negative for other tests like antithrombin III , [place] [address] a mutation, protein C and protein S deficiencies. - CT CAP in May 2019 was negative for any metastatic disease.  This was done because of her history of endometrial cancer.   - As she had unprovoked pulmonary embolism, her chance of recurrent DVT is about 8% at year 1 and about 40% at year 5.  At this time the benefits of continued anticoagulation outweigh the risk.  Hence we have recommended indefinite anticoagulation.  Patient is agreeable to this option. - She is continuing to tolerate Eliquis  without any bleeding issues. - D-dimer has been undetectable since May 2020 - Most recent labs (01/15/2024): Normal renal function, and no anemia. - No current symptoms concerning for recurrent DVT or PE - She denies any falls or head strikes within the past year - PLAN: At this time, benefits of anticoagulation outweigh risks.  Continue Eliquis .  We will reevaluate risk-benefit ratio in 1 year.   2.  Erythrocytosis, SECONDARY - Patient seen at the request of Dr. Lydia Sams - CBC on 05/08/2023: Hb-16.4, HCT-48, RBC count 5.3. - She has sleep apnea, but does not use her CPAP regularly. - Smokes about 3 to 4 cigarettes/day started in 1989. - JAK2 V617F and reflex testing negative on 06/19/2023.  Serum EPO level 14.6. - She take spironolactone  and Lasix , which can cause volume contraction and hemoconcentration with falsely elevated Hgb/RBC/HCT - Labs today (01/15/2024): With normal LDH.  Hgb 15.1/hematocrit 46.2. - PLAN: MPN workup negative.  Encourage smoking cessation (see below).  Encourage compliance with CPAP. - Continue surveillance if any significant changes, would consider bone marrow biopsy, but this is not indicated at present.   3.  History of endometrial cancer: - She underwent TLH, BSO, bilateral sentinel lymph node dissection on 11/04/2015 at [facility]. - It was a stage Ia, grade 1-low risk.  4.   Tobacco use - She smokes 0.5 PPD x 35 years (since 1989) - PLAN: She does not qualify for LCS/LDCT chest at this time.  Information provided regarding smoking cessation counseling that is available through[facility], but patient reports that she is not interested at this time.  5.  Social/Family History: - Lives at home with husband. Retired with disability. 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 Dry ice exposure. Tobacco use of 3-4 cigarettes a day for 35 years, other than 2 ppd for 2 years.  - No family history of polycythemia. Maternal uncle had lung cancer. Maternal grandmother had lung cancer.  PLAN SUMMARY:  >> Labs in 1 year = CBC/D, CMP, LDH, D-dimer >> OFFICE visit in 1 year     REVIEW OF SYSTEMS:   Review of Systems  Constitutional:  Positive for fatigue. Negative for appetite change, chills, diaphoresis, fever and unexpected weight change.  HENT:   Positive for trouble swallowing. Negative for lump/mass and nosebleeds.   Eyes:  Negative for eye problems.  Respiratory:  Positive for cough and shortness of breath (COPD). Negative for hemoptysis.   Cardiovascular:  Negative for chest pain, leg swelling and palpitations.  Gastrointestinal:  Negative for abdominal pain, blood in stool, constipation, diarrhea, nausea and vomiting.  Genitourinary:  Negative for hematuria.   Musculoskeletal:  Negative for arthralgias.  Skin: Negative.   Neurological:  Positive for headaches (Migraines). Negative for dizziness and light-headedness.  Hematological:  Does not bruise/bleed easily.  Psychiatric/Behavioral:  Negative for depression and sleep disturbance. The patient is not nervous/anxious.      PHYSICAL EXAM:  ECOG PERFORMANCE STATUS: 1 - Symptomatic but completely ambulatory  Vitals:   01/15/24 0931 01/15/24 0936  BP: (!) 150/82 (!) 157/93  Pulse: 65   Resp: 18   Temp: 97.8 F (36.6 C)   SpO2: 98%     Filed Weights   01/15/24 0931  Weight: 224 lb (101.6 kg)   Physical Exam Constitutional:      Appearance: Normal appearance. She is obese.  Cardiovascular:     Heart sounds: Normal heart sounds.  Pulmonary:     Breath sounds: Normal breath sounds.  Musculoskeletal:     Right lower leg: No edema.     Left lower leg: No edema.  Neurological:     General: No focal deficit present.     Mental Status: Mental status is at baseline.  Psychiatric:        Behavior: Behavior normal. Behavior is cooperative.     PAST MEDICAL/SURGICAL HISTORY:  Past Medical History:  Diagnosis Date   Anemia    Anxiety    Arthritis    Asthma 05/25/2013   Autonomic neuropathy    Cancer of endometrium (HCC) 10/18/2015   Cellulitis, face 05/03/2021   Constipation    COPD (chronic obstructive pulmonary disease) (HCC)    CTS (carpal tunnel syndrome)    Depression    Diabetes mellitus without complication (HCC)    Dysphagia 12/14/2016   Encounter for screening colonoscopy    Endometrial cancer (HCC) 2017  Gastritis without bleeding    GERD (gastroesophageal reflux disease)    Gross hematuria 05/11/2019   Headache(784.0)    Heavy menses 10/11/2015   HTN (hypertension)    Hypercholesterolemia    Hypothyroidism    IBS (irritable bowel syndrome)    Osteoarthritis    osteoarthritis   Pancreatitis    per patient    Recurrent boils    buttocks and low back.   S/P laparoscopic hysterectomy 11/04/2015   Shortness of breath    Sleep apnea    CPAP machine   Uncontrolled type 2 diabetes mellitus with diabetic polyneuropathy, with long-term current use of insulin  08/04/2017   Uterine cancer (HCC) 2017   Vitamin D  deficiency    Past Surgical History:  Procedure Laterality Date   ABDOMINAL HYSTERECTOMY  2018   endometrial cancer, surgery done at [facility]    BIOPSY  01/02/2017   Procedure: BIOPSY;  Surgeon: Alyce Jubilee, MD;  Location: [facility];  Service: Endoscopy;;  gastric biopsy    CESAREAN SECTION  1989   COLONOSCOPY WITH PROPOFOL  N/A 01/02/2017   Dr. Nolene Baumgarten: redundant colon, two 2-3 mm polyps in sigmoid colon (hyperplastic)   EAR CYST EXCISION Right 11/29/2021   Procedure: EXCISION OF EXTERNAL EAR LESION;  Surgeon: Daleen Dubs, DO;  Location: [facility] OR;  Service: ENT;  Laterality: Right;   ESOPHAGOGASTRODUODENOSCOPY  05/22/2011   Dr. Nolene Baumgarten: H.pylori gastritis    ESOPHAGOGASTRODUODENOSCOPY (EGD) WITH PROPOFOL  N/A 01/02/2017   Dr. Nolene Baumgarten: possible web in proximal esophagus s/p dilation, moderate gastritis (negative H.pylori)   FOOT SURGERY Left    tendon repair   IR US  GUIDE VASC ACCESS LEFT  01/18/2021   OOPHORECTOMY     OTOPLASATY Left 06/15/2021   Procedure: Excision of left auricular lesion with endaural meatoplasty;  Surgeon: Daleen Dubs, DO;  Location: [facility] OR;  Service: ENT;  Laterality: Left;   POLYPECTOMY  01/02/2017   Procedure: POLYPECTOMY;  Surgeon: Alyce Jubilee, MD;  Location: [facility];  Service: Endoscopy;;  sigmoid colon polyps times 2   REDUCTION MAMMAPLASTY  1996   SAVORY DILATION N/A 01/02/2017   Procedure: SAVORY DILATION;  Surgeon: Alyce Jubilee, MD;  Location: [facility];  Service: Endoscopy;  Laterality: N/A;   TUBAL LIGATION      SOCIAL HISTORY:  Social History   Socioeconomic History   Marital status: Married    Spouse name: Not on file   Number of children: 1   Years of education: Not on file   Highest education level: Not on file  Occupational History   Not on file  Tobacco Use   Smoking status: Every Day    Current packs/day: 1.50    Average packs/day: 1.5 packs/day for 22.0 years (33.0 ttl pk-yrs)    Types: Cigarettes   Smokeless tobacco: Never  Vaping Use   Vaping status: Never Used  Substance and Sexual Activity   Alcohol use: No   Drug use: No   Sexual activity: Not Currently    Birth control/protection: Surgical    Comment: hysterectomy  Other Topics Concern   Not on file  Social History Narrative    Lives with husband and she has been married to for 9 years.  Recently got evicted and has been living in a motel since last July.   She has 1 son that lives in [place].  Reports one grandbaby      Enjoys watching TV mostly animal [place].      Diet:  Eats all food groups   Caffeine: Soda not diet, green tea, coffee about 3 cups of caffeine per day.   Water: Drinks 5-6 8 ounce bottles throughout the day.      Wears a seatbelt.  Currently does not have a vehicle.  Smoke detectors in the facility she is living in.  Does not have any weapons.   Social Drivers of Health   Financial Resource Strain: Patient Declined (10/09/2022)   Received from [facility], [facility]   Overall Financial Resource Strain (CARDIA)    Difficulty of Paying Living Expenses: Patient declined  Food Insecurity: Patient Declined (10/09/2022)   Received from [facility], [facility]   Hunger Vital Sign    Worried About Running Out of Food in the Last Year: Patient declined    Ran Out of Food in the Last Year: Patient declined  Transportation Needs: Patient Declined (10/09/2022)   Received from [facility], [facility]   PRAPARE - Transportation    Lack of Transportation (Medical): Patient declined    Lack of Transportation (Non-Medical): Patient declined  Physical Activity: Insufficiently Active (04/06/2022)   Exercise Vital Sign    Days of Exercise per Week: 1 day    Minutes of Exercise per Session: 30 min  Stress: Stress Concern Present (04/06/2022)   [employer] of Occupational Health - Occupational Stress Questionnaire    Feeling of Stress : Rather much  Social Connections: Unknown (10/04/2022)   Received from [facility], [facility]   Social Network    Social Network: Not on file  Intimate Partner Violence: Unknown (10/04/2022)   Received from [facility], [facility]   HITS    Physically Hurt: Not on file    Insult or Talk Down To: Not on file    Threaten Physical Harm: Not  on file    Scream or Curse: Not on file    FAMILY HISTORY:  Family History  Problem Relation Age of Onset   Diabetes Mother    Hypertension Mother    COPD Mother    Asthma Mother    Hypercholesterolemia Mother    Hypertension Sister    Hyperlipidemia Sister    Diabetes Sister    Asthma Brother    Alcohol abuse Brother    Asthma Son    Cancer Maternal Grandmother        lung, throat, breast   Alzheimer's disease Maternal Grandmother    Hypertension Maternal Grandmother    Hypercholesterolemia Maternal Grandmother    Heart disease Maternal Grandfather    Stroke Maternal Grandfather    Clotting disorder Maternal Grandfather        blood clots in legs   Hypertension Sister    Hyperlipidemia Sister    Stroke Maternal Aunt    Clotting disorder Maternal Aunt        blood clots in legs   Hypertension Maternal Aunt    Hypercholesterolemia Maternal Aunt    Hypertension Maternal Aunt    Asthma Maternal Aunt    Clotting disorder Maternal Uncle        blood clot -legs traveled to heart and he died of heart attack   Colon cancer Neg Hx     CURRENT MEDICATIONS:  Outpatient Encounter Medications as of 01/15/2024  Medication Sig Note   Accu-Chek Softclix Lancets lancets To test glucose 4 times a day    albuterol  (PROVENTIL ) (2.5 MG/3ML) 0.083% nebulizer solution Take 3 mLs (2.5 mg total) by nebulization every 6 (six) hours as needed for wheezing  or shortness of breath.    albuterol  (VENTOLIN  HFA) 108 (90 Base) MCG/ACT inhaler Inhale 2 puffs into the lungs every 6 (six) hours as needed for wheezing or shortness of breath.    amLODipine  (NORVASC ) 10 MG tablet Take 1 tablet (10 mg total) by mouth daily.    apixaban  (ELIQUIS ) 5 MG TABS tablet Take 1 tablet (5 mg total) by mouth 2 (two) times daily. On 08/12/17 @ 9PM, start 1 tab (5 mg) two times daily.    Blood Glucose Monitoring Suppl (ACCU-CHEK GUIDE ME) w/Device KIT 1 Piece by Does not apply route as directed.    Blood Glucose  Monitoring Suppl DEVI 1 each by Does not apply route in the morning, at noon, and at bedtime. May substitute to any manufacturer covered by patient's insurance.    clotrimazole  (GYNE-LOTRIMIN  3) 2 % vaginal cream Place 1 Applicatorful vaginally at bedtime. 11/09/2022: Prn.   Continuous Glucose Sensor (DEXCOM G7 SENSOR) MISC 1 Device by Does not apply route as directed.    DULoxetine  (CYMBALTA ) 60 MG capsule Take 1 capsule (60 mg total) by mouth daily.    empagliflozin  (JARDIANCE ) 25 MG TABS tablet Take 1 tablet (25 mg total) by mouth daily before breakfast.    esomeprazole  (NEXIUM ) 40 MG capsule TAKE 1 CAPSULE BY MOUTH TWICE DAILY BEFORE MEAL(S)    Fezolinetant  (VEOZAH ) 45 MG TABS Take 1 tablet (45 mg total) by mouth daily.    furosemide  (LASIX ) 40 MG tablet Take 1 tablet by mouth once daily    glucose blood (ACCU-CHEK GUIDE) test strip Test glucose 4 times a day    insulin  glargine (LANTUS  SOLOSTAR) 100 UNIT/ML Solostar Pen Inject 46 Units into the skin daily.    insulin  lispro (HUMALOG  KWIKPEN) 100 UNIT/ML KwikPen Inject 8 Units into the skin 3 (three) times daily.    Insulin  Pen Needle 31G X 5 MM MISC 1 Device by Does not apply route in the morning, at noon, in the evening, and at bedtime.    labetalol  (NORMODYNE ) 100 MG tablet Take 1 tablet (100 mg total) by mouth 2 (two) times daily.    linaclotide  (LINZESS ) 290 MCG CAPS capsule TAKE 1 CAPSULE BY MOUTH ONCE DAILY BEFORE BREAKFAST    metFORMIN  (GLUCOPHAGE ) 1000 MG tablet Take 1 tablet (1,000 mg total) by mouth 2 (two) times daily with a meal.    Misc. Devices MISC Blood pressure monitoring device    naloxone (NARCAN) nasal spray 4 mg/0.1 mL SMARTSIG:Spray(s) In Nostril    nitroGLYCERIN (NITRODUR - DOSED IN MG/24 HR) 0.1 mg/hr patch 0.1 mg daily.    ondansetron  (ZOFRAN -ODT) 4 MG disintegrating tablet Take 1 tablet (4 mg total) by mouth every 6 (six) hours as needed for nausea or vomiting.    RELION PEN NEEDLES 31G X 6 MM MISC USE 1 PEN NEEDLE  ONCE DAILY IN THE AFTERNOON    rizatriptan (MAXALT) 10 MG tablet Take 10 mg by mouth daily as needed for migraine.    rosuvastatin  (CRESTOR ) 40 MG tablet Take 1 tablet (40 mg total) by mouth daily.    spironolactone  (ALDACTONE ) 50 MG tablet Take 1 tablet (50 mg total) by mouth daily.    Tiotropium Bromide Monohydrate  (SPIRIVA  RESPIMAT) 2.5 MCG/ACT AERS Inhale 2 puffs into the lungs daily.    traMADol  (ULTRAM ) 50 MG tablet Take 50 mg by mouth 2 (two) times daily as needed.    Vitamin D , Ergocalciferol , (DRISDOL ) 1.25 MG (50000 UNIT) CAPS capsule Take 1 capsule by mouth once a week  No facility-administered encounter medications on file as of 01/15/2024.    ALLERGIES:  Allergies  Allergen Reactions   Ibuprofen      dizziness   Lisinopril  Swelling    Swelling in face    LABORATORY DATA:  I have reviewed the labs as listed.  CBC    Component Value Date/Time   WBC 6.7 01/15/2024 0812   RBC 4.96 01/15/2024 0812   HGB 15.1 (H) 01/15/2024 0812   HGB 16.4 (H) 05/08/2023 1447   HCT 46.2 (H) 01/15/2024 0812   HCT 48.0 (H) 05/08/2023 1447   PLT 288 01/15/2024 0812   PLT 366 05/08/2023 1447   MCV 93.1 01/15/2024 0812   MCV 91 05/08/2023 1447   MCH 30.4 01/15/2024 0812   MCHC 32.7 01/15/2024 0812   RDW 12.6 01/15/2024 0812   RDW 11.9 05/08/2023 1447   LYMPHSABS 1.9 01/15/2024 0812   LYMPHSABS 3.1 05/08/2023 1447   MONOABS 0.5 01/15/2024 0812   EOSABS 0.4 01/15/2024 0812   EOSABS 0.3 05/08/2023 1447   BASOSABS 0.1 01/15/2024 0812   BASOSABS 0.1 05/08/2023 1447      Latest Ref Rng & Units 01/15/2024    8:12 AM 08/13/2023    2:22 PM 05/08/2023    2:47 PM  CMP  Glucose 70 - 99 mg/dL 782  956  213   BUN 6 - 20 mg/dL 10  11  8    Creatinine 0.44 - 1.00 mg/dL 0.86  5.78  4.69   Sodium 135 - 145 mmol/L 133  141  141   Potassium 3.5 - 5.1 mmol/L 3.7  4.3  4.3   Chloride 98 - 111 mmol/L 100  105  103   CO2 22 - 32 mmol/L 22  21  22    Calcium  8.9 - 10.3 mg/dL 9.8  62.9  52.8   Total  Protein 6.5 - 8.1 g/dL 7.6  6.4  7.5   Total Bilirubin 0.0 - 1.2 mg/dL 0.4  <4.1  0.2   Alkaline Phos 38 - 126 U/L 119  130  138   AST 15 - 41 U/L 15  13  12    ALT 0 - 44 U/L 13  12  14      DIAGNOSTIC IMAGING:  I have independently reviewed the relevant imaging and discussed with the patient.   WRAP UP:  All questions were answered. The patient knows to call the clinic with any problems, questions or concerns.  Medical decision making: Moderate  Time spent on visit: I spent 20 minutes counseling the patient face to face. The total time spent in the appointment was 30 minutes and more than 50% was on counseling.  Sonnie Dusky, PA-C  01/15/24 12:10 PM

## 2024-01-15 ENCOUNTER — Inpatient Hospital Stay (HOSPITAL_BASED_OUTPATIENT_CLINIC_OR_DEPARTMENT_OTHER): Admitting: Physician Assistant

## 2024-01-15 ENCOUNTER — Inpatient Hospital Stay: Payer: Medicaid Other | Attending: Physician Assistant | Admitting: Physician Assistant

## 2024-01-15 ENCOUNTER — Other Ambulatory Visit (HOSPITAL_COMMUNITY)
Admission: RE | Admit: 2024-01-15 | Discharge: 2024-01-15 | Disposition: A | Discharge status: Home / Self Care | Source: Ambulatory Visit | Attending: Gastroenterology | Admitting: Gastroenterology

## 2024-01-15 ENCOUNTER — Inpatient Hospital Stay: Payer: Medicaid Other

## 2024-01-15 VITALS — BP 157/93 | HR 65 | Temp 97.8°F | Resp 18 | Ht 65.0 in | Wt 224.0 lb

## 2024-01-15 DIAGNOSIS — R748 Abnormal levels of other serum enzymes: Secondary | ICD-10-CM | POA: Diagnosis present

## 2024-01-15 DIAGNOSIS — F17210 Nicotine dependence, cigarettes, uncomplicated: Secondary | ICD-10-CM | POA: Diagnosis not present

## 2024-01-15 DIAGNOSIS — D751 Secondary polycythemia: Secondary | ICD-10-CM

## 2024-01-15 DIAGNOSIS — I2782 Chronic pulmonary embolism: Secondary | ICD-10-CM | POA: Diagnosis not present

## 2024-01-15 DIAGNOSIS — Z7901 Long term (current) use of anticoagulants: Secondary | ICD-10-CM | POA: Diagnosis not present

## 2024-01-15 DIAGNOSIS — Z86711 Personal history of pulmonary embolism: Secondary | ICD-10-CM | POA: Diagnosis present

## 2024-01-15 LAB — CBC WITH DIFFERENTIAL/PLATELET
Abs Immature Granulocytes: 0.02 10*3/uL (ref 0.00–0.07)
Basophils Absolute: 0.1 10*3/uL (ref 0.0–0.1)
Basophils Relative: 1 %
Eosinophils Absolute: 0.4 10*3/uL (ref 0.0–0.5)
Eosinophils Relative: 6 %
HCT: 46.2 % — ABNORMAL HIGH (ref 36.0–46.0)
Hemoglobin: 15.1 g/dL — ABNORMAL HIGH (ref 12.0–15.0)
Immature Granulocytes: 0 %
Lymphocytes Relative: 29 %
Lymphs Abs: 1.9 10*3/uL (ref 0.7–4.0)
MCH: 30.4 pg (ref 26.0–34.0)
MCHC: 32.7 g/dL (ref 30.0–36.0)
MCV: 93.1 fL (ref 80.0–100.0)
Monocytes Absolute: 0.5 10*3/uL (ref 0.1–1.0)
Monocytes Relative: 8 %
Neutro Abs: 3.7 10*3/uL (ref 1.7–7.7)
Neutrophils Relative %: 56 %
Platelets: 288 10*3/uL (ref 150–400)
RBC: 4.96 MIL/uL (ref 3.87–5.11)
RDW: 12.6 % (ref 11.5–15.5)
WBC: 6.7 10*3/uL (ref 4.0–10.5)
nRBC: 0 % (ref 0.0–0.2)

## 2024-01-15 LAB — LACTATE DEHYDROGENASE: LDH: 111 U/L (ref 98–192)

## 2024-01-15 LAB — COMPREHENSIVE METABOLIC PANEL WITH GFR
ALT: 13 U/L (ref 0–44)
AST: 15 U/L (ref 15–41)
Albumin: 3.9 g/dL (ref 3.5–5.0)
Alkaline Phosphatase: 119 U/L (ref 38–126)
Anion gap: 11 (ref 5–15)
BUN: 10 mg/dL (ref 6–20)
CO2: 22 mmol/L (ref 22–32)
Calcium: 9.8 mg/dL (ref 8.9–10.3)
Chloride: 100 mmol/L (ref 98–111)
Creatinine, Ser: 0.81 mg/dL (ref 0.44–1.00)
GFR, Estimated: >60 mL/min (ref >60)
Glucose, Bld: 365 mg/dL — ABNORMAL HIGH (ref 70–99)
Potassium: 3.7 mmol/L (ref 3.5–5.1)
Sodium: 133 mmol/L — ABNORMAL LOW (ref 135–145)
Total Bilirubin: 0.4 mg/dL (ref 0.0–1.2)
Total Protein: 7.6 g/dL (ref 6.5–8.1)

## 2024-01-15 LAB — GAMMA GT: GGT: 34 U/L (ref 7–50)

## 2024-01-15 NOTE — Patient Instructions (Signed)
 [facility] at [facility] **VISIT SUMMARY & IMPORTANT INSTRUCTIONS **   You were seen today by Sheril Dines PA-C for your history of blood clots.   Continue to take Eliquis  5 mg twice daily for blood thinner. Be careful to avoid any injury or bleeding. Watch for symptoms of blood clots coming back and seek immediate medical attention if you have any new chest pain, difficulty breathing, or evidence of bleeding.  If you have any falls and head injury, make sure you go to the ER to check for any bleeding on your brain.  ELEVATED RED BLOOD CELLS You have mildly elevated red blood cells, which is most likely related to your underlying tobacco use. Please see the attached handout for information on how to quit smoking.  FOLLOW-UP APPOINTMENT: 1 year  ** Thank you for trusting me with your healthcare!  I strive to provide all of my patients with quality care at each visit.  If you receive a survey for this visit, I would be so grateful to you for taking the time to provide feedback.  Thank you in advance!  ~ Freddy Kinne                   Dr. Paulett Boros   &   Sheril Dines, PA-C   - - - - - - - - - - - - - - - - - -    Thank you for choosing [facility] at [facility] to provide your oncology and hematology care.  To afford each patient quality time with our provider, please arrive at least 15 minutes before your scheduled appointment time.   If you have a lab appointment with the Cancer Center please come in thru the Main Entrance and check in at the main information desk.  You need to re-schedule your appointment should you arrive 10 or more minutes late.  We strive to give you quality time with our providers, and arriving late affects you and other patients whose appointments are after yours.  Also, if you no show three or more times for appointments you may be dismissed from the clinic at the providers discretion.     Again, thank  you for choosing [facility].  Our hope is that these requests will decrease the amount of time that you wait before being seen by our physicians.       _____________________________________________________________  Should you have questions after your visit to [facility], please contact our office at [phone] and follow the prompts.  Our office hours are 8:00 a.m. and 4:30 p.m. Monday - Friday.  Please note that voicemails left after 4:00 p.m. may not be returned until the following business day.  We are closed weekends and major holidays.  You do have access to a nurse 24-7, just call the main number to the clinic [phone] and do not press any options, hold on the line and a nurse will answer the phone.    For prescription refill requests, have your pharmacy contact our office and allow 72 hours.

## 2024-01-16 ENCOUNTER — Ambulatory Visit (HOSPITAL_COMMUNITY)
Admission: RE | Admit: 2024-01-16 | Discharge: 2024-01-16 | Disposition: A | Discharge status: Home / Self Care | Source: Ambulatory Visit | Attending: Gastroenterology | Admitting: Gastroenterology

## 2024-01-16 DIAGNOSIS — R748 Abnormal levels of other serum enzymes: Secondary | ICD-10-CM | POA: Insufficient documentation

## 2024-01-16 DIAGNOSIS — K760 Fatty (change of) liver, not elsewhere classified: Secondary | ICD-10-CM | POA: Diagnosis not present

## 2024-01-16 LAB — IGA: IgA: 190 mg/dL (ref 87–352)

## 2024-01-16 LAB — MITOCHONDRIAL ANTIBODIES: Mitochondrial M2 Ab, IgG: <20 U (ref 0.0–20.0)

## 2024-01-16 LAB — TISSUE TRANSGLUTAMINASE, IGA: Tissue Transglutaminase Ab, IgA: <2 U/mL (ref 0–3)

## 2024-01-18 LAB — MISC LABCORP TEST (SEND OUT): Labcorp test code: 1612

## 2024-01-18 NOTE — Progress Notes (Signed)
 Orders

## 2024-01-21 LAB — MISC LABCORP TEST (SEND OUT): Labcorp test code: 550659

## 2024-01-22 ENCOUNTER — Ambulatory Visit: Payer: Self-pay | Admitting: Gastroenterology

## 2024-01-23 ENCOUNTER — Ambulatory Visit

## 2024-01-30 ENCOUNTER — Ambulatory Visit: Admitting: Orthopaedic Surgery

## 2024-02-01 ENCOUNTER — Ambulatory Visit: Admitting: Internal Medicine

## 2024-02-01 ENCOUNTER — Ambulatory Visit (INDEPENDENT_AMBULATORY_CARE_PROVIDER_SITE_OTHER): Admitting: Internal Medicine

## 2024-02-01 ENCOUNTER — Encounter: Payer: Self-pay | Admitting: Internal Medicine

## 2024-02-01 VITALS — BP 142/72 | HR 80 | Ht 65.0 in | Wt 225.0 lb

## 2024-02-01 DIAGNOSIS — I10 Essential (primary) hypertension: Secondary | ICD-10-CM

## 2024-02-01 DIAGNOSIS — Z794 Long term (current) use of insulin: Secondary | ICD-10-CM

## 2024-02-01 DIAGNOSIS — R232 Flushing: Secondary | ICD-10-CM

## 2024-02-01 DIAGNOSIS — E1142 Type 2 diabetes mellitus with diabetic polyneuropathy: Secondary | ICD-10-CM

## 2024-02-01 DIAGNOSIS — F331 Major depressive disorder, recurrent, moderate: Secondary | ICD-10-CM | POA: Diagnosis not present

## 2024-02-01 NOTE — Patient Instructions (Signed)
 Please start taking Cymbalta  regularly for numbness of legs and anxiety.  Please start taking Amlodipine , Spironolactone  and Labetalol  regularly.  Please continue to take medications as prescribed.  Please continue to follow low carb diet and perform moderate exercise/walking as tolerated.

## 2024-02-01 NOTE — Progress Notes (Signed)
 Established Patient Office Visit  Subjective:  Patient ID: Sonya James, female    DOB: Jun 20, 1966  Age: 58 y.o. MRN: [id number]  CC:  Chief Complaint  Patient presents with   Medical Management of Chronic Issues    4 week f/u     HPI Sonya James is a 58 y.o. female with past medical history of HTN, type II DM, HLD, COPD, OSA, PE, GERD, obesity and tobacco abuse who presents for f/u of her chronic medical conditions.  HTN: Her BP is elevated today. She has amlodipine  10 mg QD, spironolactone  50 mg QD and labetalol  100 mg BID, but compliance is questionable. She has not taken amlodipine , labetalol  and spironolactone  today.  She denies any chest pain or palpitations.  She has occipital area headache, radiating towards her neck area.  She has tried taking BC powder for it. Had lengthy discussion to take medications regularly. She agrees to start taking medications regularly now.  Type II DM with HLD: Her HbA1c has worsened to 11.1 in 03/25. She is on Lantus  46 units, Metformin  1000 mg BID and Jardiance  25 mg QD.  She has been placed on Humalog  8 units 3 times daily recently, but she is noncompliant.  She takes Lipitor for HLD.  She complains of dyspnea and wheezing intermittently, and has been using albuterol  inhaler for her COPD. Denies any hemoptysis.  MDD: She has felt better with Cymbalta , but has not been taking regularly. She had anxiety, apathy, crying spells and decreased concentration, which is chronic.  She has moved out of her mother's home as she was stressing her out.  She gets anxious when her  mother tries to call her. Denies any SI or HI currently.  Medications are reviewed today, which showed multiple old refill pattern, which suggests noncompliance.   Past Medical History:  Diagnosis Date   Anemia    Anxiety    Arthritis    Asthma 05/25/2013   Autonomic neuropathy    Cancer of endometrium (HCC) 10/18/2015   Cellulitis, face 05/03/2021   Constipation    COPD  (chronic obstructive pulmonary disease) (HCC)    CTS (carpal tunnel syndrome)    Depression    Diabetes mellitus without complication (HCC)    Dysphagia 12/14/2016   Encounter for screening colonoscopy    Endometrial cancer (HCC) 2017   Gastritis without bleeding    GERD (gastroesophageal reflux disease)    Gross hematuria 05/11/2019   Headache(784.0)    Heavy menses 10/11/2015   HTN (hypertension)    Hypercholesterolemia    Hypothyroidism    IBS (irritable bowel syndrome)    Osteoarthritis    osteoarthritis   Pancreatitis    per patient    Recurrent boils    buttocks and low back.   S/P laparoscopic hysterectomy 11/04/2015   Shortness of breath    Sleep apnea    CPAP machine   Uncontrolled type 2 diabetes mellitus with diabetic polyneuropathy, with long-term current use of insulin  08/04/2017   Uterine cancer (HCC) 2017   Vitamin D  deficiency     Past Surgical History:  Procedure Laterality Date   ABDOMINAL HYSTERECTOMY  2018   endometrial cancer, surgery done at [facility]    BIOPSY  01/02/2017   Procedure: BIOPSY;  Surgeon: Alyce Jubilee, MD;  Location: [facility];  Service: Endoscopy;;  gastric biopsy   CESAREAN SECTION  1989   COLONOSCOPY WITH PROPOFOL  N/A 01/02/2017   Dr. Nolene Baumgarten: redundant colon, two 2-3 mm polyps  in sigmoid colon (hyperplastic)   EAR CYST EXCISION Right 11/29/2021   Procedure: EXCISION OF EXTERNAL EAR LESION;  Surgeon: Daleen Dubs, DO;  Location: [facility];  Service: ENT;  Laterality: Right;   ESOPHAGOGASTRODUODENOSCOPY  05/22/2011   Dr. Nolene Baumgarten: H.pylori gastritis    ESOPHAGOGASTRODUODENOSCOPY (EGD) WITH PROPOFOL  N/A 01/02/2017   Dr. Nolene Baumgarten: possible web in proximal esophagus s/p dilation, moderate gastritis (negative H.pylori)   FOOT SURGERY Left    tendon repair   IR US  GUIDE VASC ACCESS LEFT  01/18/2021   OOPHORECTOMY     OTOPLASATY Left 06/15/2021   Procedure: Excision of left auricular lesion with endaural meatoplasty;  Surgeon:  Daleen Dubs, DO;  Location: [facility];  Service: ENT;  Laterality: Left;   POLYPECTOMY  01/02/2017   Procedure: POLYPECTOMY;  Surgeon: Alyce Jubilee, MD;  Location: [facility];  Service: Endoscopy;;  sigmoid colon polyps times 2   REDUCTION MAMMAPLASTY  1996   SAVORY DILATION N/A 01/02/2017   Procedure: SAVORY DILATION;  Surgeon: Alyce Jubilee, MD;  Location: [facility];  Service: Endoscopy;  Laterality: N/A;   TUBAL LIGATION      Family History  Problem Relation Age of Onset   Diabetes Mother    Hypertension Mother    COPD Mother    Asthma Mother    Hypercholesterolemia Mother    Hypertension Sister    Hyperlipidemia Sister    Diabetes Sister    Asthma Brother    Alcohol abuse Brother    Asthma Son    Cancer Maternal Grandmother        lung, throat, breast   Alzheimer's disease Maternal Grandmother    Hypertension Maternal Grandmother    Hypercholesterolemia Maternal Grandmother    Heart disease Maternal Grandfather    Stroke Maternal Grandfather    Clotting disorder Maternal Grandfather        blood clots in legs   Hypertension Sister    Hyperlipidemia Sister    Stroke Maternal Aunt    Clotting disorder Maternal Aunt        blood clots in legs   Hypertension Maternal Aunt    Hypercholesterolemia Maternal Aunt    Hypertension Maternal Aunt    Asthma Maternal Aunt    Clotting disorder Maternal Uncle        blood clot -legs traveled to heart and he died of heart attack   Colon cancer Neg Hx     Social History   Socioeconomic History   Marital status: Married    Spouse name: Not on file   Number of children: 1   Years of education: Not on file   Highest education level: Not on file  Occupational History   Not on file  Tobacco Use   Smoking status: Every Day    Current packs/day: 1.50    Average packs/day: 1.5 packs/day for 22.0 years (33.0 ttl pk-yrs)    Types: Cigarettes   Smokeless tobacco: Never  Vaping Use   Vaping status: Never Used   Substance and Sexual Activity   Alcohol use: No   Drug use: No   Sexual activity: Not Currently    Birth control/protection: Surgical    Comment: hysterectomy  Other Topics Concern   Not on file  Social History Narrative   Lives with husband and she has been married to for 9 years.  Recently got evicted and has been living in a motel since last July.   She has 1 son that lives in [place].  Reports one grandbaby      Enjoys watching TV mostly animal [place].      Diet: Eats all food groups   Caffeine: Soda not diet, green tea, coffee about 3 cups of caffeine per day.   Water: Drinks 5-6 8 ounce bottles throughout the day.      Wears a seatbelt.  Currently does not have a vehicle.  Smoke detectors in the facility she is living in.  Does not have any weapons.   Social Drivers of Health   Financial Resource Strain: Patient Declined (10/09/2022)   Received from [facility], [facility]   Overall Financial Resource Strain (CARDIA)    Difficulty of Paying Living Expenses: Patient declined  Food Insecurity: Patient Declined (10/09/2022)   Received from [facility], [facility]   Hunger Vital Sign    Worried About Running Out of Food in the Last Year: Patient declined    Ran Out of Food in the Last Year: Patient declined  Transportation Needs: Patient Declined (10/09/2022)   Received from [facility], [facility]   PRAPARE - Transportation    Lack of Transportation (Medical): Patient declined    Lack of Transportation (Non-Medical): Patient declined  Physical Activity: Insufficiently Active (04/06/2022)   Exercise Vital Sign    Days of Exercise per Week: 1 day    Minutes of Exercise per Session: 30 min  Stress: Stress Concern Present (04/06/2022)   [employer] of Occupational Health - Occupational Stress Questionnaire    Feeling of Stress : Rather much  Social Connections: Unknown (10/04/2022)   Received from [facility], [facility]   Social Network     Social Network: Not on file  Intimate Partner Violence: Unknown (10/04/2022)   Received from [employer], [facility]   HITS    Physically Hurt: Not on file    Insult or Talk Down To: Not on file    Threaten Physical Harm: Not on file    Scream or Curse: Not on file    Outpatient Medications Prior to Visit  Medication Sig Dispense Refill   Accu-Chek Softclix Lancets lancets To test glucose 4 times a day 150 each 2   albuterol  (PROVENTIL ) (2.5 MG/3ML) 0.083% nebulizer solution Take 3 mLs (2.5 mg total) by nebulization every 6 (six) hours as needed for wheezing or shortness of breath. 150 mL 1   albuterol  (VENTOLIN  HFA) 108 (90 Base) MCG/ACT inhaler Inhale 2 puffs into the lungs every 6 (six) hours as needed for wheezing or shortness of breath. 18 g 5   amLODipine  (NORVASC ) 10 MG tablet Take 1 tablet (10 mg total) by mouth daily. 90 tablet 1   apixaban  (ELIQUIS ) 5 MG TABS tablet Take 1 tablet (5 mg total) by mouth 2 (two) times daily. On 08/12/17 @ 9PM, start 1 tab (5 mg) two times daily. 60 tablet 5   Blood Glucose Monitoring Suppl (ACCU-CHEK GUIDE ME) w/Device KIT 1 Piece by Does not apply route as directed. 1 kit 0   Blood Glucose Monitoring Suppl DEVI 1 each by Does not apply route in the morning, at noon, and at bedtime. May substitute to any manufacturer covered by patient's insurance. 1 each 0   clotrimazole  (GYNE-LOTRIMIN  3) 2 % vaginal cream Place 1 Applicatorful vaginally at bedtime. 21 g 0   Continuous Glucose Sensor (DEXCOM G7 SENSOR) MISC 1 Device by Does not apply route as directed. 9 each 3   DULoxetine  (CYMBALTA ) 60 MG capsule Take 1 capsule (60 mg total) by mouth daily.  90 capsule 1   empagliflozin  (JARDIANCE ) 25 MG TABS tablet Take 1 tablet (25 mg total) by mouth daily before breakfast. 90 tablet 3   esomeprazole  (NEXIUM ) 40 MG capsule TAKE 1 CAPSULE BY MOUTH TWICE DAILY BEFORE MEAL(S) 180 capsule 3   Fezolinetant  (VEOZAH ) 45 MG TABS Take 1 tablet (45 mg total) by mouth  daily. 30 tablet 5   furosemide  (LASIX ) 40 MG tablet Take 1 tablet by mouth once daily 90 tablet 0   glucose blood (ACCU-CHEK GUIDE) test strip Test glucose 4 times a day 150 each 2   insulin  glargine (LANTUS  SOLOSTAR) 100 UNIT/ML Solostar Pen Inject 46 Units into the skin daily. 45 mL 4   insulin  lispro (HUMALOG  KWIKPEN) 100 UNIT/ML KwikPen Inject 8 Units into the skin 3 (three) times daily. 30 mL 3   Insulin  Pen Needle 31G X 5 MM MISC 1 Device by Does not apply route in the morning, at noon, in the evening, and at bedtime. 400 each 3   labetalol  (NORMODYNE ) 100 MG tablet Take 1 tablet (100 mg total) by mouth 2 (two) times daily. 180 tablet 1   linaclotide  (LINZESS ) 290 MCG CAPS capsule TAKE 1 CAPSULE BY MOUTH ONCE DAILY BEFORE BREAKFAST 90 capsule 3   metFORMIN  (GLUCOPHAGE ) 1000 MG tablet Take 1 tablet (1,000 mg total) by mouth 2 (two) times daily with a meal. 90 tablet 3   Misc. Devices MISC Blood pressure monitoring device 1 each 0   naloxone (NARCAN) nasal spray 4 mg/0.1 mL SMARTSIG:Spray(s) In Nostril     nitroGLYCERIN (NITRODUR - DOSED IN MG/24 HR) 0.1 mg/hr patch 0.1 mg daily.     ondansetron  (ZOFRAN -ODT) 4 MG disintegrating tablet Take 1 tablet (4 mg total) by mouth every 6 (six) hours as needed for nausea or vomiting. 30 tablet 2   RELION PEN NEEDLES 31G X 6 MM MISC USE 1 PEN NEEDLE ONCE DAILY IN THE AFTERNOON     rizatriptan (MAXALT) 10 MG tablet Take 10 mg by mouth daily as needed for migraine.     rosuvastatin  (CRESTOR ) 40 MG tablet Take 1 tablet (40 mg total) by mouth daily. 90 tablet 3   spironolactone  (ALDACTONE ) 50 MG tablet Take 1 tablet (50 mg total) by mouth daily. 90 tablet 1   Tiotropium Bromide Monohydrate  (SPIRIVA  RESPIMAT) 2.5 MCG/ACT AERS Inhale 2 puffs into the lungs daily. 12 g 1   traMADol  (ULTRAM ) 50 MG tablet Take 50 mg by mouth 2 (two) times daily as needed.     Vitamin D , Ergocalciferol , (DRISDOL ) 1.25 MG (50000 UNIT) CAPS capsule Take 1 capsule by mouth once a  week 12 capsule 1   No facility-administered medications prior to visit.    Allergies  Allergen Reactions   Ibuprofen      dizziness   Lisinopril  Swelling    Swelling in face    ROS Review of Systems  Constitutional:  Negative for chills and fever.  HENT:  Negative for congestion, sinus pressure, sinus pain and sore throat.   Eyes:  Negative for pain and discharge.  Respiratory:  Negative for cough, shortness of breath and wheezing.   Cardiovascular:  Negative for chest pain and palpitations.  Gastrointestinal:  Negative for abdominal pain, diarrhea, nausea and vomiting.  Endocrine: Negative for polydipsia and polyuria.       Hot flashes  Genitourinary:  Negative for dysuria and hematuria.  Musculoskeletal:  Positive for arthralgias, back pain and neck pain. Negative for neck stiffness.  Skin:  Negative for rash.  Neurological:  Positive for headaches. Negative for dizziness and weakness.  Psychiatric/Behavioral:  Positive for decreased concentration and sleep disturbance. Negative for agitation and behavioral problems. The patient is nervous/anxious.       Objective:    Physical Exam Vitals reviewed.  Constitutional:      General: She is not in acute distress.    Appearance: She is obese. She is not diaphoretic.  HENT:     Head: Normocephalic and atraumatic.     Nose: Nose normal. No congestion.     Mouth/Throat:     Mouth: Mucous membranes are moist.     Pharynx: No posterior oropharyngeal erythema.  Eyes:     General: No scleral icterus.    Extraocular Movements: Extraocular movements intact.  Neck:     Vascular: No carotid bruit.  Cardiovascular:     Rate and Rhythm: Normal rate and regular rhythm.     Pulses: Normal pulses.     Heart sounds: Murmur (Systolic over upper sternal border) heard.  Pulmonary:     Breath sounds: Normal breath sounds. No rales.  Musculoskeletal:     Cervical back: Neck supple. Tenderness present.     Right lower leg: No edema.      Left lower leg: No edema.  Skin:    General: Skin is warm.     Findings: No rash.  Neurological:     General: No focal deficit present.     Mental Status: She is alert and oriented to person, place, and time.     Sensory: No sensory deficit.     Motor: No weakness.  Psychiatric:        Mood and Affect: Mood is anxious.        Behavior: Behavior normal.     BP (!) 142/72 (BP Location: Left Arm)   Pulse 80   Ht 5\' 5"  (1.651 m)   Wt 225 lb (102.1 kg)   LMP 09/20/2015 (Exact Date)   SpO2 95%   BMI 37.44 kg/m  Wt Readings from Last 3 Encounters:  02/01/24 225 lb (102.1 kg)  01/15/24 224 lb (101.6 kg)  01/03/24 229 lb (103.9 kg)    Lab Results  Component Value Date   TSH 1.670 05/08/2023   Lab Results  Component Value Date   WBC 6.7 01/15/2024   HGB 15.1 (H) 01/15/2024   HCT 46.2 (H) 01/15/2024   MCV 93.1 01/15/2024   PLT 288 01/15/2024   Lab Results  Component Value Date   NA 133 (L) 01/15/2024   K 3.7 01/15/2024   CO2 22 01/15/2024   GLUCOSE 365 (H) 01/15/2024   BUN 10 01/15/2024   CREATININE 0.81 01/15/2024   BILITOT 0.4 01/15/2024   ALKPHOS 119 01/15/2024   AST 15 01/15/2024   ALT 13 01/15/2024   PROT 7.6 01/15/2024   ALBUMIN 3.9 01/15/2024   CALCIUM  9.8 01/15/2024   ANIONGAP 11 01/15/2024   EGFR 81 08/13/2023   Lab Results  Component Value Date   CHOL 213 (H) 08/13/2023   Lab Results  Component Value Date   HDL 47 08/13/2023   Lab Results  Component Value Date   LDLCALC 147 (H) 08/13/2023   Lab Results  Component Value Date   TRIG 103 08/13/2023   Lab Results  Component Value Date   CHOLHDL 4.5 (H) 08/13/2023   Lab Results  Component Value Date   HGBA1C 11.1 (A) 11/26/2023      Assessment & Plan:   Problem List Items Addressed This Visit  Cardiovascular and Mediastinum   Essential hypertension   BP Readings from Last 1 Encounters:  02/01/24 (!) 142/72   Uncontrolled with Amlodipine  10 mg QD, Spironolactone  50 mg QD  and Labetalol  100 mg BID - compliance is questionable Needs to restart Amlodipine , labetalol  and Spironolactone  Advised to take Labetalol  50 mg BID when she restarts her medicines Counseled for compliance with the medications Advised DASH diet and moderate exercise/walking, at least 150 mins/week      Hot flashes   Due to smoking history, not a candidate for HRT Has tried SSRI without much relief Started Veozah  in the last visit        Endocrine   Type 2 diabetes mellitus with diabetic polyneuropathy, with long-term current use of insulin  (HCC) - Primary   Lab Results  Component Value Date   HGBA1C 11.1 (A) 11/26/2023   Uncontrolled, recently increased dose of Lantus  to 46 units nightly - now improving although she has not brought blood glucose log or meter On Metformin , Jardiance  and Lantus  46 U with Humalog  8 units 3 times daily, followed by endocrinology -noncompliant Advised to follow diabetic diet On statin and ACEi Diabetic eye exam: Advised to follow up with Ophthalmology for diabetic eye exam  Continue Cymbalta  for neuropathy      Relevant Orders   Microalbumin / creatinine urine ratio     Other   Moderate episode of recurrent major depressive disorder (HCC)   Flowsheet Row Office Visit from 02/01/2024 in [facility]  PHQ-9 Total Score 18      Uncontrolled, but has variability due to stress from her mother On Cymbalta  60 mg daily, but has not taken it recently - advised to take Cymbalta  regularly for now If persistent, plan to add Vraylar        No orders of the defined types were placed in this encounter.   Follow-up: Return in about 4 months (around 06/03/2024) for HTN and DM.    Meldon Sport, MD

## 2024-02-01 NOTE — Assessment & Plan Note (Signed)
 Lab Results  Component Value Date   HGBA1C 11.1 (A) 11/26/2023   Uncontrolled, recently increased dose of Lantus  to 46 units nightly - now improving although she has not brought blood glucose log or meter On Metformin , Jardiance  and Lantus  46 U with Humalog  8 units 3 times daily, followed by endocrinology -noncompliant Advised to follow diabetic diet On statin and ACEi Diabetic eye exam: Advised to follow up with Ophthalmology for diabetic eye exam  Continue Cymbalta  for neuropathy

## 2024-02-01 NOTE — Assessment & Plan Note (Addendum)
 BP Readings from Last 1 Encounters:  02/01/24 (!) 142/72   Uncontrolled with Amlodipine  10 mg QD, Spironolactone  50 mg QD and Labetalol  100 mg BID - compliance is questionable Needs to restart Amlodipine , labetalol  and Spironolactone  Advised to take Labetalol  50 mg BID when she restarts her medicines Counseled for compliance with the medications Advised DASH diet and moderate exercise/walking, at least 150 mins/week

## 2024-02-01 NOTE — Assessment & Plan Note (Signed)
 Due to smoking history, not a candidate for HRT Has tried SSRI without much relief Started Veozah  in the last visit

## 2024-02-02 NOTE — Assessment & Plan Note (Signed)
 Flowsheet Row Office Visit from 02/01/2024 in [facility]  PHQ-9 Total Score 18      Uncontrolled, but has variability due to stress from her mother On Cymbalta  60 mg daily, but has not taken it recently - advised to take Cymbalta  regularly for now If persistent, plan to add Vraylar

## 2024-02-03 LAB — MICROALBUMIN / CREATININE URINE RATIO
Creatinine, Urine: 81.6 mg/dL
Microalb/Creat Ratio: 101 mg/g{creat} — ABNORMAL HIGH (ref 0–29)
Microalbumin, Urine: 82.2 ug/mL

## 2024-02-19 ENCOUNTER — Other Ambulatory Visit: Payer: Medicaid Other

## 2024-02-26 ENCOUNTER — Ambulatory Visit: Payer: Medicaid Other | Admitting: Physician Assistant

## 2024-02-27 ENCOUNTER — Ambulatory Visit: Admitting: Internal Medicine

## 2024-02-27 ENCOUNTER — Encounter: Payer: Self-pay | Admitting: Internal Medicine

## 2024-02-27 NOTE — Progress Notes (Deleted)
 Name: Sonya James  MRN/ DOB: [id number], 02/23/1966   Age/ Sex: 58 y.o., female    PCP: Meldon Sport, MD   Reason for Endocrinology Evaluation: Type 2 Diabetes Mellitus     Date of Initial Endocrinology Visit: 09/02/2022    PATIENT IDENTIFIER: Sonya James is a 58 y.o. female with a past medical history of T2DM, HTN, Hx of endometrial cancer and Hx of Pancreatitis and dyslipidemia . The patient presented for initial endocrinology clinic visit on 09/02/2021  for consultative assistance with her diabetes management.    HPI: Sonya James was    Diagnosed with DM 2014 Prior Medications tried/Intolerance: Has been on jardiance  without intolerance issues           Hemoglobin A1c has ranged from 5.9% in 2017, peaking at 12.2% in 2022.  She has dysphagia and thyromegaly . She had an ultrasound in 2005 that did not show any nodules at the time . TFT's normal    On her initial visit to our clinic she had an A1c 9.0 % , She was on Glipizide , Novolin  Mix , and Metformin  . We stopped Glipizide , Novolin  Mix, started basal insulin , Jardiance  and continued Metformin       SUBJECTIVE:   During the last visit (11/26/2023): A1c 11.1 %     Today (02/27/24): Sonya James is here for a follow up on diabetes management. She checks her  blood sugars 0 times daily. The patient has not had hypoglycemic episodes since the last clinic visit.   She was seen by podiatry 08/31/2023 by Dr. Mignon Alberts Patient had a follow-up with hematology for erythrocytosis, unprovoked pulmonary embolism as well as history of endometrial cancer  Denies nausea or vomiting Has chronic constipation, uses Linzess   Due to insurance issues she has no insurance card and has not been able to obtain CGM   HOME DIABETES REGIMEN: Metformin   1000 mg BID  Jardiance  25 mg daily  Lantus  46 units daily  Humalog  8 units TIDQAC   Statin: yes ACE-I/ARB: yes Prior Diabetic Education: yes - does not like going there     METER DOWNLOAD SUMMARY: n/a   DIABETIC COMPLICATIONS: Microvascular complications:  Neuropathy Denies: CKD, retinopathy  Last eye exam: Completed 2023  Macrovascular complications:   Denies: CAD, CVA      PAST HISTORY: Past Medical History:  Past Medical History:  Diagnosis Date   Anemia    Anxiety    Arthritis    Asthma 05/25/2013   Autonomic neuropathy    Cancer of endometrium (HCC) 10/18/2015   Cellulitis, face 05/03/2021   Constipation    COPD (chronic obstructive pulmonary disease) (HCC)    CTS (carpal tunnel syndrome)    Depression    Diabetes mellitus without complication (HCC)    Dysphagia 12/14/2016   Encounter for screening colonoscopy    Endometrial cancer (HCC) 2017   Gastritis without bleeding    GERD (gastroesophageal reflux disease)    Gross hematuria 05/11/2019   Headache(784.0)    Heavy menses 10/11/2015   HTN (hypertension)    Hypercholesterolemia    Hypothyroidism    IBS (irritable bowel syndrome)    Osteoarthritis    osteoarthritis   Pancreatitis    per patient    Recurrent boils    buttocks and low back.   S/P laparoscopic hysterectomy 11/04/2015   Shortness of breath    Sleep apnea    CPAP machine   Uncontrolled type 2 diabetes mellitus with diabetic polyneuropathy, with long-term current use of  insulin  08/04/2017   Uterine cancer (HCC) 2017   Vitamin D  deficiency    Past Surgical History:  Past Surgical History:  Procedure Laterality Date   ABDOMINAL HYSTERECTOMY  2018   endometrial cancer, surgery done at [facility]    BIOPSY  01/02/2017   Procedure: BIOPSY;  Surgeon: Alyce Jubilee, MD;  Location: [facility];  Service: Endoscopy;;  gastric biopsy   CESAREAN SECTION  1989   COLONOSCOPY WITH PROPOFOL  N/A 01/02/2017   Dr. Nolene Baumgarten: redundant colon, two 2-3 mm polyps in sigmoid colon (hyperplastic)   EAR CYST EXCISION Right 11/29/2021   Procedure: EXCISION OF EXTERNAL EAR LESION;  Surgeon: Daleen Dubs, DO;   Location: [facility] OR;  Service: ENT;  Laterality: Right;   ESOPHAGOGASTRODUODENOSCOPY  05/22/2011   Dr. Nolene Baumgarten: H.pylori gastritis    ESOPHAGOGASTRODUODENOSCOPY (EGD) WITH PROPOFOL  N/A 01/02/2017   Dr. Nolene Baumgarten: possible web in proximal esophagus s/p dilation, moderate gastritis (negative H.pylori)   FOOT SURGERY Left    tendon repair   IR US  GUIDE VASC ACCESS LEFT  01/18/2021   OOPHORECTOMY     OTOPLASATY Left 06/15/2021   Procedure: Excision of left auricular lesion with endaural meatoplasty;  Surgeon: Daleen Dubs, DO;  Location: [facility] OR;  Service: ENT;  Laterality: Left;   POLYPECTOMY  01/02/2017   Procedure: POLYPECTOMY;  Surgeon: Alyce Jubilee, MD;  Location: [facility];  Service: Endoscopy;;  sigmoid colon polyps times 2   REDUCTION MAMMAPLASTY  1996   SAVORY DILATION N/A 01/02/2017   Procedure: SAVORY DILATION;  Surgeon: Alyce Jubilee, MD;  Location: [facility];  Service: Endoscopy;  Laterality: N/A;   TUBAL LIGATION      Social History:  reports that she has been smoking cigarettes. She has a 33 pack-year smoking history. She has never used smokeless tobacco. She reports that she does not drink alcohol and does not use drugs. Family History:  Family History  Problem Relation Age of Onset   Diabetes Mother    Hypertension Mother    COPD Mother    Asthma Mother    Hypercholesterolemia Mother    Hypertension Sister    Hyperlipidemia Sister    Diabetes Sister    Asthma Brother    Alcohol abuse Brother    Asthma Son    Cancer Maternal Grandmother        lung, throat, breast   Alzheimer's disease Maternal Grandmother    Hypertension Maternal Grandmother    Hypercholesterolemia Maternal Grandmother    Heart disease Maternal Grandfather    Stroke Maternal Grandfather    Clotting disorder Maternal Grandfather        blood clots in legs   Hypertension Sister    Hyperlipidemia Sister    Stroke Maternal Aunt    Clotting disorder Maternal Aunt        blood clots in legs    Hypertension Maternal Aunt    Hypercholesterolemia Maternal Aunt    Hypertension Maternal Aunt    Asthma Maternal Aunt    Clotting disorder Maternal Uncle        blood clot -legs traveled to heart and he died of heart attack   Colon cancer Neg Hx      HOME MEDICATIONS: Allergies as of 02/27/2024       Reactions   Ibuprofen     dizziness   Lisinopril  Swelling   Swelling in face        Medication List        Accurate as  of February 27, 2024  7:13 AM. If you have any questions, ask your nurse or doctor.          Accu-Chek Guide Me w/Device Kit 1 Piece by Does not apply route as directed.   Blood Glucose Monitoring Suppl Devi 1 each by Does not apply route in the morning, at noon, and at bedtime. May substitute to any manufacturer covered by patient's insurance.   Accu-Chek Guide test strip Generic drug: glucose blood Test glucose 4 times a day   Accu-Chek Softclix Lancets lancets To test glucose 4 times a day   albuterol  108 (90 Base) MCG/ACT inhaler Commonly known as: VENTOLIN  HFA Inhale 2 puffs into the lungs every 6 (six) hours as needed for wheezing or shortness of breath.   albuterol  (2.5 MG/3ML) 0.083% nebulizer solution Commonly known as: PROVENTIL  Take 3 mLs (2.5 mg total) by nebulization every 6 (six) hours as needed for wheezing or shortness of breath.   amLODipine  10 MG tablet Commonly known as: NORVASC  Take 1 tablet (10 mg total) by mouth daily.   apixaban  5 MG Tabs tablet Commonly known as: ELIQUIS  Take 1 tablet (5 mg total) by mouth 2 (two) times daily. On 08/12/17 @ 9PM, start 1 tab (5 mg) two times daily.   clotrimazole  2 % vaginal cream Commonly known as: GYNE-LOTRIMIN  3 Place 1 Applicatorful vaginally at bedtime.   Dexcom G7 Sensor Misc 1 Device by Does not apply route as directed.   DULoxetine  60 MG capsule Commonly known as: CYMBALTA  Take 1 capsule (60 mg total) by mouth daily.   empagliflozin  25 MG Tabs tablet Commonly known as:  Jardiance  Take 1 tablet (25 mg total) by mouth daily before breakfast.   esomeprazole  40 MG capsule Commonly known as: NEXIUM  TAKE 1 CAPSULE BY MOUTH TWICE DAILY BEFORE MEAL(S)   furosemide  40 MG tablet Commonly known as: LASIX  Take 1 tablet by mouth once daily   insulin  lispro 100 UNIT/ML KwikPen Commonly known as: HumaLOG  KwikPen Inject 8 Units into the skin 3 (three) times daily.   labetalol  100 MG tablet Commonly known as: NORMODYNE  Take 1 tablet (100 mg total) by mouth 2 (two) times daily.   Lantus  SoloStar 100 UNIT/ML Solostar Pen Generic drug: insulin  glargine Inject 46 Units into the skin daily.   linaclotide  290 MCG Caps capsule Commonly known as: Linzess  TAKE 1 CAPSULE BY MOUTH ONCE DAILY BEFORE BREAKFAST   metFORMIN  1000 MG tablet Commonly known as: GLUCOPHAGE  Take 1 tablet (1,000 mg total) by mouth 2 (two) times daily with a meal.   Misc. Devices Misc Blood pressure monitoring device   naloxone 4 MG/0.1ML Liqd nasal spray kit Commonly known as: NARCAN SMARTSIG:Spray(s) In Nostril   nitroGLYCERIN 0.1 mg/hr patch Commonly known as: NITRODUR - Dosed in mg/24 hr 0.1 mg daily.   ondansetron  4 MG disintegrating tablet Commonly known as: ZOFRAN -ODT Take 1 tablet (4 mg total) by mouth every 6 (six) hours as needed for nausea or vomiting.   ReliOn Pen Needles 31G X 6 MM Misc Generic drug: Insulin  Pen Needle USE 1 PEN NEEDLE ONCE DAILY IN THE AFTERNOON   Insulin  Pen Needle 31G X 5 MM Misc 1 Device by Does not apply route in the morning, at noon, in the evening, and at bedtime.   rizatriptan 10 MG tablet Commonly known as: MAXALT Take 10 mg by mouth daily as needed for migraine.   rosuvastatin  40 MG tablet Commonly known as: CRESTOR  Take 1 tablet (40 mg total) by mouth daily.  Spiriva  Respimat 2.5 MCG/ACT Aers Generic drug: Tiotropium Bromide Monohydrate  Inhale 2 puffs into the lungs daily.   spironolactone  50 MG tablet Commonly known as:  ALDACTONE  Take 1 tablet (50 mg total) by mouth daily.   traMADol  50 MG tablet Commonly known as: ULTRAM  Take 50 mg by mouth 2 (two) times daily as needed.   Veozah  45 MG Tabs Generic drug: Fezolinetant  Take 1 tablet (45 mg total) by mouth daily.   Vitamin D  (Ergocalciferol ) 1.25 MG (50000 UNIT) Caps capsule Commonly known as: DRISDOL  Take 1 capsule by mouth once a week         ALLERGIES: Allergies  Allergen Reactions   Ibuprofen      dizziness   Lisinopril  Swelling    Swelling in face        OBJECTIVE:   VITAL SIGNS: LMP 09/20/2015 (Exact Date)    PHYSICAL EXAM:  General: Pt appears well and is in NAD  Lungs: Clear with good BS bilat   Heart: RRR   Extremities:  Lower extremities - No pretibial edema. No lesions.  Neuro: MS is good with appropriate affect, pt is alert and Ox3   DM Foot Exam 08/31/2023 per podiatry     DATA REVIEWED:  Lab Results  Component Value Date   HGBA1C 11.1 (A) 11/26/2023   HGBA1C 8.6 (H) 08/13/2023   HGBA1C 8.4 (H) 05/08/2023     Latest Reference Range & Units 08/13/23 14:22  Sodium 134 - 144 mmol/L 141  Potassium 3.5 - 5.2 mmol/L 4.3  Chloride 96 - 106 mmol/L 105  CO2 20 - 29 mmol/L 21  Glucose 70 - 99 mg/dL 119 (H)  BUN 6 - 24 mg/dL 11  Creatinine 1.47 - 8.29 mg/dL 5.62  Calcium  8.7 - 10.2 mg/dL 13.0 (H)  BUN/Creatinine Ratio 9 - 23  13  eGFR >59 mL/min/1.73 81  Alkaline Phosphatase 44 - 121 IU/L 130 (H)  Albumin 3.8 - 4.9 g/dL 4.0  AST 0 - 40 IU/L 13  ALT 0 - 32 IU/L 12  Total Protein 6.0 - 8.5 g/dL 6.4  Total Bilirubin 0.0 - 1.2 mg/dL <8.6    Latest Reference Range & Units 08/13/23 14:22  Total CHOL/HDL Ratio 0.0 - 4.4 ratio 4.5 (H)  Cholesterol, Total 100 - 199 mg/dL 578 (H)  HDL Cholesterol >39 mg/dL 47  Triglycerides 0 - 469 mg/dL 629  VLDL Cholesterol Cal 5 - 40 mg/dL 19  LDL Chol Calc (NIH) 0 - 99 mg/dL 528 (H)  (H): Data is abnormally high    Thyroid  Ultrasound 09/19/2021  Estimated total number of  nodules >/= 1 cm: 0   Number of spongiform nodules >/=  2 cm not described below (TR1): 0   Number of mixed cystic and solid nodules >/= 1.5 cm not described below (TR2): 0   _________________________________________________________   Few scattered less than 5 mm nodules do not meet criteria for imaging surveillance.   IMPRESSION: No significant sonographic abnormality of the thyroid .     Old records , labs and images have been reviewed.     ASSESSMENT / PLAN / RECOMMENDATIONS:   1) Type 2 Diabetes Mellitus, Poorly controlled, With neuropathic  complications - Most recent A1c of 11.1 %. Goal A1c < 7.0 %.    -A1c increased from 8.4% to 11.1% -She is NOT a candidate for GLP-1 agonist and DPP-4 inhibitors due to hx of pancreatitis  -Patient continues to drink sugar sweetened beverages, in office BG 353 Mg/DL after drinking regular soda, I again  emphasized the importance of avoiding sugar sweetened beverages -We discussed microvascular complications of uncontrolled DM and the importance of following a low-carb diet -I question her compliance, because it took her a few minutes to remember how much Lantus  she gives herself, eventually she told me she thinks she takes 40 units (of note, I increased this to 46 units last visit) -I will start her on prandial insulin  as below to be taken with each meal -I am not going to give her correction scale at this time as she is not using the Dexcom due to cost  MEDICATIONS: Continue Metformin  1000 mg Twice d ay  Continue Jardiance  25 mg daily  Increase Lantus  46 units daily Start Humalog  8 units before each meal 3 times daily  EDUCATION / INSTRUCTIONS: BG monitoring instructions: Patient is instructed to check her blood sugars 3 times a day, fasting . Call [facility] if: BG persistently < 70  I reviewed the Rule of 15 for the treatment of hypoglycemia in detail with the patient. Literature supplied.   2) Diabetic  complications:  Eye: Does not have known diabetic retinopathy.  Neuro/ Feet: Does have known diabetic peripheral neuropathy. Renal: Patient does not have known baseline CKD. She is not on an ACEI/ARB at present.   3) Dyslipidemia :  -LDL elevated - I had switched atorvastatin  80 mg to rosuvastatin  40 mg 11/2023  Medication Stop atorvastatin  80 mg Start rosuvastatin  40 mg daily   Follow-up in 3 months   Signed electronically by: Natale Bail, MD  [facility]  [facility] [address] [place] [address] [place], [place] [address] Phone: [phone] FAX: [phone]   CC: Meldon Sport, MD [address] [place] [address] Phone: [phone]  Fax: [phone]    Return to Endocrinology clinic as below: Future Appointments  Date Time Provider Department Center  02/27/2024  8:10 AM Sarra Rachels, Julian Obey, MD [facility] None  05/30/2024  9:00 AM Meldon Sport, MD [facility]  01/14/2025  9:30 AM [facility] None  01/14/2025 10:30 AM Sonnie Dusky, PA-C [facility] None

## 2024-04-28 ENCOUNTER — Telehealth: Payer: Self-pay | Admitting: *Deleted

## 2024-04-28 ENCOUNTER — Ambulatory Visit: Payer: Self-pay

## 2024-04-28 DIAGNOSIS — I739 Peripheral vascular disease, unspecified: Secondary | ICD-10-CM

## 2024-04-28 DIAGNOSIS — J441 Chronic obstructive pulmonary disease with (acute) exacerbation: Secondary | ICD-10-CM

## 2024-04-28 NOTE — Progress Notes (Unsigned)
 Complex Care Management Note Care Guide Note  04/28/2024 Name: Sonya James MRN: [id number] DOB: [birth year]   Complex Care Management Outreach Attempts: An unsuccessful telephone outreach was attempted today to offer the patient information about available complex care management services.  Follow Up Plan:  Additional outreach attempts will be made to offer the patient complex care management information and services.   Encounter Outcome:  No Answer  Harlene Satterfield  [facility]  [facility], [facility] Guide  Direct Dial : [phone]  Fax [phone]

## 2024-04-28 NOTE — Telephone Encounter (Signed)
 FYI Only or Action Required?: Action required by provider: medication refill request.  Patient was last seen in primary care on 02/01/2024 by [name], MD.  Called Nurse Triage reporting Cough.  Symptoms began 2.5 weeks.  Interventions attempted: OTC medications: cough medication.  Symptoms are: gradually worsening.  Triage Disposition: No disposition on file.  Patient/caregiver understands and will follow disposition?:     Copied from CRM #[id number]. Topic: Clinical - Red Word Triage >> Apr 28, 2024 12:08 PM Larissa RAMAN wrote: [facility] that prompted transfer to Nurse Triage: chest congestion, cough w/ mucous, pain    Reason for Disposition  Cough has been present for > 3 weeks  Answer Assessment - Initial Assessment Questions 1. ONSET: When did the cough begin?      X 2.5 weeks 2. SEVERITY: How bad is the cough today?      severe 3. SPUTUM: Describe the color of your sputum (e.g., none, dry cough; clear, white, yellow, green)     clear 4. HEMOPTYSIS: Are you coughing up any blood? If Yes, ask: How much? (e.g., flecks, streaks, tablespoons, etc.)     no 5. DIFFICULTY BREATHING: Are you having difficulty breathing? If Yes, ask: How bad is it? (e.g., mild, moderate, severe)      Mild to moderate 6. FEVER: Do you have a fever? If Yes, ask: What is your temperature, how was it measured, and when did it start?     no 7. CARDIAC HISTORY: Do you have any history of heart disease? (e.g., heart attack, congestive heart failure)      na 8. LUNG HISTORY: Do you have any history of lung disease?  (e.g., pulmonary embolus, asthma, emphysema)     COPD 9. PE RISK FACTORS: Do you have a history of blood clots? (or: recent major surgery, recent prolonged travel, bedridden)     na 10. OTHER SYMPTOMS: Do you have any other symptoms? (e.g., runny nose, wheezing, chest pain)       Wheezing, ribs & throat hurt 8/10 pain from coughing, runny 11. PREGNANCY: Is  there any chance you are pregnant? When was your last menstrual period?       na 12. TRAVEL: Have you traveled out of the country in the last month? (e.g., travel history, exposures)       Na   Phone number for today and tomorrow [phone]:  pt would like to request a liquid medication for cough be called in for patient.  Protocols used: Cough - Acute Productive-A-AH

## 2024-04-29 NOTE — Progress Notes (Signed)
 Complex Care Management Note  Care Guide Note 04/29/2024 Name: Sonya James MRN: [id number] DOB: Sep 11, 1966  Sonya James is a 58 y.o. year old female who sees [name], MD for primary care. I reached out to [employer] by phone today to offer complex care management services.  [name] was given information about Complex Care Management services today including:   The Complex Care Management services include support from the care team which includes your Nurse Care Manager, Clinical Social Worker, or Pharmacist.  The Complex Care Management team is here to help remove barriers to the health concerns and goals most important to you. Complex Care Management services are voluntary, and the patient may decline or stop services at any time by request to their care team member.   Complex Care Management Consent Status: Patient agreed to services and verbal consent obtained.   Follow up plan:  Telephone appointment with complex care management team member scheduled for:  05/09/24  Encounter Outcome:  Patient Scheduled  Harlene Satterfield  [facility]  [facility], [facility] Guide  Direct Dial : [phone]  Fax [phone]

## 2024-05-01 ENCOUNTER — Ambulatory Visit (INDEPENDENT_AMBULATORY_CARE_PROVIDER_SITE_OTHER): Payer: Self-pay | Admitting: Internal Medicine

## 2024-05-01 ENCOUNTER — Encounter: Payer: Self-pay | Admitting: Internal Medicine

## 2024-05-01 VITALS — BP 138/88 | HR 57 | Ht 65.0 in | Wt 217.6 lb

## 2024-05-01 DIAGNOSIS — E1165 Type 2 diabetes mellitus with hyperglycemia: Secondary | ICD-10-CM

## 2024-05-01 DIAGNOSIS — J441 Chronic obstructive pulmonary disease with (acute) exacerbation: Secondary | ICD-10-CM | POA: Diagnosis not present

## 2024-05-01 DIAGNOSIS — Z794 Long term (current) use of insulin: Secondary | ICD-10-CM | POA: Diagnosis not present

## 2024-05-01 DIAGNOSIS — I10 Essential (primary) hypertension: Secondary | ICD-10-CM

## 2024-05-01 MED ORDER — AZITHROMYCIN 250 MG PO TABS: ORAL_TABLET | ORAL | 0 refills | Status: AC

## 2024-05-01 MED ORDER — GUAIFENESIN-CODEINE 100-10 MG/5ML PO SOLN: 5.0000 mL | Freq: Three times a day (TID) | ORAL | 0 refills | Status: DC | PRN

## 2024-05-01 MED ORDER — METHYLPREDNISOLONE 4 MG PO TBPK: ORAL_TABLET | ORAL | 0 refills | Status: DC

## 2024-05-01 MED ORDER — SPIRIVA RESPIMAT 2.5 MCG/ACT IN AERS: 2.0000 | INHALATION_SPRAY | Freq: Every day | RESPIRATORY_TRACT | 1 refills | Status: AC

## 2024-05-01 NOTE — Progress Notes (Signed)
 Acute Office Visit  Subjective:    Patient ID: Sonya James, female    DOB: 1966-08-02, 58 y.o.   MRN: [id number]  Chief Complaint  Patient presents with   Breathing Problem    Reports sx of trouble breathing. Pt reports ongoing cough, and clear mucus.     HPI Patient is in today for complaint of cough with clear expectoration and worsening of dyspnea for the last 2 weeks.  She denies any fever or chills.  She has been using albuterol  inhaler with mild relief with dyspnea.  She has not been using her maintenance inhaler as she does not have it.  She is still smokes about 2-3 cigarettes per day.  She reports having high blood glucose levels recently as well, in higher 200s.  She has history of noncompliance to her insulin  regimen.  She is supposed to take Lantus  46 units and Humalog  8 units 3 times daily, but has been taking Lantus  40 units with inconsistency with Humalog .  She is also supposed to take metformin  1000 mg twice daily and Jardiance  25 mg QD, but compliance is questionable.  Past Medical History:  Diagnosis Date   Anemia    Anxiety    Arthritis    Asthma 05/25/2013   Autonomic neuropathy    Cancer of endometrium (HCC) 10/18/2015   Cellulitis, face 05/03/2021   Constipation    COPD (chronic obstructive pulmonary disease) (HCC)    CTS (carpal tunnel syndrome)    Depression    Diabetes mellitus without complication (HCC)    Dysphagia 12/14/2016   Encounter for screening colonoscopy    Endometrial cancer (HCC) 2017   Gastritis without bleeding    GERD (gastroesophageal reflux disease)    Gross hematuria 05/11/2019   Headache(784.0)    Heavy menses 10/11/2015   HTN (hypertension)    Hypercholesterolemia    Hypothyroidism    IBS (irritable bowel syndrome)    Osteoarthritis    osteoarthritis   Pancreatitis    per patient    Recurrent boils    buttocks and low back.   S/P laparoscopic hysterectomy 11/04/2015   Shortness of breath    Sleep apnea    CPAP  machine   Uncontrolled type 2 diabetes mellitus with diabetic polyneuropathy, with long-term current use of insulin  08/04/2017   Uterine cancer (HCC) 2017   Vitamin D  deficiency     Past Surgical History:  Procedure Laterality Date   ABDOMINAL HYSTERECTOMY  2018   endometrial cancer, surgery done at [facility]    BIOPSY  01/02/2017   Procedure: BIOPSY;  Surgeon: Margo LITTIE Haddock, MD;  Location: [facility];  Service: Endoscopy;;  gastric biopsy   CESAREAN SECTION  1989   COLONOSCOPY WITH PROPOFOL  N/A 01/02/2017   Dr. Haddock: redundant colon, two 2-3 mm polyps in sigmoid colon (hyperplastic)   EAR CYST EXCISION Right 11/29/2021   Procedure: EXCISION OF EXTERNAL EAR LESION;  Surgeon: Llewellyn Gerard DELENA, DO;  Location: [facility] OR;  Service: ENT;  Laterality: Right;   ESOPHAGOGASTRODUODENOSCOPY  05/22/2011   Dr. Haddock: H.pylori gastritis    ESOPHAGOGASTRODUODENOSCOPY (EGD) WITH PROPOFOL  N/A 01/02/2017   Dr. Haddock: possible web in proximal esophagus s/p dilation, moderate gastritis (negative H.pylori)   FOOT SURGERY Left    tendon repair   IR US  GUIDE VASC ACCESS LEFT  01/18/2021   OOPHORECTOMY     OTOPLASATY Left 06/15/2021   Procedure: Excision of left auricular lesion with endaural meatoplasty;  Surgeon: Llewellyn Gerard A, DO;  Location: [facility] OR;  Service: ENT;  Laterality: Left;   POLYPECTOMY  01/02/2017   Procedure: POLYPECTOMY;  Surgeon: Margo LITTIE Haddock, MD;  Location: [facility];  Service: Endoscopy;;  sigmoid colon polyps times 2   REDUCTION MAMMAPLASTY  1996   SAVORY DILATION N/A 01/02/2017   Procedure: SAVORY DILATION;  Surgeon: Margo LITTIE Haddock, MD;  Location: [facility];  Service: Endoscopy;  Laterality: N/A;   TUBAL LIGATION      Family History  Problem Relation Age of Onset   Diabetes Mother    Hypertension Mother    COPD Mother    Asthma Mother    Hypercholesterolemia Mother    Hypertension Sister    Hyperlipidemia Sister    Diabetes Sister    Asthma Brother     Alcohol abuse Brother    Asthma Son    Cancer Maternal Grandmother        lung, throat, breast   Alzheimer's disease Maternal Grandmother    Hypertension Maternal Grandmother    Hypercholesterolemia Maternal Grandmother    Heart disease Maternal Grandfather    Stroke Maternal Grandfather    Clotting disorder Maternal Grandfather        blood clots in legs   Hypertension Sister    Hyperlipidemia Sister    Stroke Maternal Aunt    Clotting disorder Maternal Aunt        blood clots in legs   Hypertension Maternal Aunt    Hypercholesterolemia Maternal Aunt    Hypertension Maternal Aunt    Asthma Maternal Aunt    Clotting disorder Maternal Uncle        blood clot -legs traveled to heart and he died of heart attack   Colon cancer Neg Hx     Social History   Socioeconomic History   Marital status: Married    Spouse name: Not on file   Number of children: 1   Years of education: Not on file   Highest education level: Not on file  Occupational History   Not on file  Tobacco Use   Smoking status: Every Day    Current packs/day: 1.50    Average packs/day: 1.5 packs/day for 22.0 years (33.0 ttl pk-yrs)    Types: Cigarettes   Smokeless tobacco: Never  Vaping Use   Vaping status: Never Used  Substance and Sexual Activity   Alcohol use: No   Drug use: No   Sexual activity: Not Currently    Birth control/protection: Surgical    Comment: hysterectomy  Other Topics Concern   Not on file  Social History Narrative   Lives with husband and she has been married to for 9 years.  Recently got evicted and has been living in a motel since last July.   She has 1 son that lives in [place].  Reports one grandbaby      Enjoys watching TV mostly animal [place].      Diet: Eats all food groups   Caffeine: Soda not diet, green tea, coffee about 3 cups of caffeine per day.   Water: Drinks 5-6 8 ounce bottles throughout the day.      Wears a seatbelt.  Currently does not have a vehicle.   Smoke detectors in the facility she is living in.  Does not have any weapons.   Social Drivers of Health   Financial Resource Strain: Patient Declined (10/09/2022)   Received from [facility]   Overall Financial Resource Strain (CARDIA)    Difficulty of Paying Living Expenses:  Patient declined  Food Insecurity: Patient Declined (10/09/2022)   Received from [facility]   Hunger Vital Sign    Within the past 12 months, you worried that your food would run out before you got the money to buy more.: Patient declined    Within the past 12 months, the food you bought just didn't last and you didn't have money to get more.: Patient declined  Transportation Needs: Patient Declined (10/09/2022)   Received from [facility] - Transportation    Lack of Transportation (Medical): Patient declined    Lack of Transportation (Non-Medical): Patient declined  Physical Activity: Insufficiently Active (04/06/2022)   Exercise Vital Sign    Days of Exercise per Week: 1 day    Minutes of Exercise per Session: 30 min  Stress: Stress Concern Present (04/06/2022)   [employer] [facility] [employer] - Occupational Stress Questionnaire    Feeling of Stress : Rather much  Social Connections: Unknown (10/04/2022)   Received from [facility]   Social Network    Social Network: Not on file  Intimate Partner Violence: Unknown (10/04/2022)   Received from [facility]   HITS    Physically Hurt: Not on file    Insult or Talk Down To: Not on file    Threaten Physical Harm: Not on file    Scream or Curse: Not on file    Outpatient Medications Prior to Visit  Medication Sig Dispense Refill   Accu-Chek Softclix Lancets lancets To test glucose 4 times a day 150 each 2   albuterol  (PROVENTIL ) (2.5 MG/3ML) 0.083% nebulizer solution Take 3 mLs (2.5 mg total) by nebulization every 6 (six) hours as needed for wheezing or shortness of breath. 150 mL 1   albuterol  (VENTOLIN  HFA) 108 (90 Base) MCG/ACT  inhaler Inhale 2 puffs into the lungs every 6 (six) hours as needed for wheezing or shortness of breath. 18 g 5   amLODipine  (NORVASC ) 10 MG tablet Take 1 tablet (10 mg total) by mouth daily. 90 tablet 1   apixaban  (ELIQUIS ) 5 MG TABS tablet Take 1 tablet (5 mg total) by mouth 2 (two) times daily. On 08/12/17 @ 9PM, start 1 tab (5 mg) two times daily. 60 tablet 5   Blood Glucose Monitoring Suppl (ACCU-CHEK GUIDE ME) w/Device KIT 1 Piece by Does not apply route as directed. 1 kit 0   Blood Glucose Monitoring Suppl DEVI 1 each by Does not apply route in the morning, at noon, and at bedtime. May substitute to any manufacturer covered by patient's insurance. 1 each 0   clotrimazole  (GYNE-LOTRIMIN  3) 2 % vaginal cream Place 1 Applicatorful vaginally at bedtime. 21 g 0   Continuous Glucose Sensor (DEXCOM G7 SENSOR) MISC 1 Device by Does not apply route as directed. 9 each 3   DULoxetine  (CYMBALTA ) 60 MG capsule Take 1 capsule (60 mg total) by mouth daily. 90 capsule 1   empagliflozin  (JARDIANCE ) 25 MG TABS tablet Take 1 tablet (25 mg total) by mouth daily before breakfast. 90 tablet 3   esomeprazole  (NEXIUM ) 40 MG capsule TAKE 1 CAPSULE BY MOUTH TWICE DAILY BEFORE MEAL(S) 180 capsule 3   Fezolinetant  (VEOZAH ) 45 MG TABS Take 1 tablet (45 mg total) by mouth daily. 30 tablet 5   furosemide  (LASIX ) 40 MG tablet Take 1 tablet by mouth once daily 90 tablet 0   glucose blood (ACCU-CHEK GUIDE) test strip Test glucose 4 times a day 150 each 2   insulin  glargine (LANTUS  SOLOSTAR) 100  UNIT/ML Solostar Pen Inject 46 Units into the skin daily. 45 mL 4   insulin  lispro (HUMALOG  KWIKPEN) 100 UNIT/ML KwikPen Inject 8 Units into the skin 3 (three) times daily. 30 mL 3   Insulin  Pen Needle 31G X 5 MM MISC 1 Device by Does not apply route in the morning, at noon, in the evening, and at bedtime. 400 each 3   labetalol  (NORMODYNE ) 100 MG tablet Take 1 tablet (100 mg total) by mouth 2 (two) times daily. 180 tablet 1    linaclotide  (LINZESS ) 290 MCG CAPS capsule TAKE 1 CAPSULE BY MOUTH ONCE DAILY BEFORE BREAKFAST 90 capsule 3   metFORMIN  (GLUCOPHAGE ) 1000 MG tablet Take 1 tablet (1,000 mg total) by mouth 2 (two) times daily with a meal. 90 tablet 3   Misc. Devices MISC Blood pressure monitoring device 1 each 0   naloxone (NARCAN) nasal spray 4 mg/0.1 mL SMARTSIG:Spray(s) In Nostril     nitroGLYCERIN (NITRODUR - DOSED IN MG/24 HR) 0.1 mg/hr patch 0.1 mg daily.     ondansetron  (ZOFRAN -ODT) 4 MG disintegrating tablet Take 1 tablet (4 mg total) by mouth every 6 (six) hours as needed for nausea or vomiting. 30 tablet 2   RELION PEN NEEDLES 31G X 6 MM MISC USE 1 PEN NEEDLE ONCE DAILY IN THE AFTERNOON     rizatriptan (MAXALT) 10 MG tablet Take 10 mg by mouth daily as needed for migraine.     rosuvastatin  (CRESTOR ) 40 MG tablet Take 1 tablet (40 mg total) by mouth daily. 90 tablet 3   spironolactone  (ALDACTONE ) 50 MG tablet Take 1 tablet (50 mg total) by mouth daily. 90 tablet 1   traMADol  (ULTRAM ) 50 MG tablet Take 50 mg by mouth 2 (two) times daily as needed.     Vitamin D , Ergocalciferol , (DRISDOL ) 1.25 MG (50000 UNIT) CAPS capsule Take 1 capsule by mouth once a week 12 capsule 1   Tiotropium Bromide Monohydrate  (SPIRIVA  RESPIMAT) 2.5 MCG/ACT AERS Inhale 2 puffs into the lungs daily. 12 g 1   No facility-administered medications prior to visit.    Allergies  Allergen Reactions   Ibuprofen      dizziness   Lisinopril  Swelling    Swelling in face    Review of Systems  Constitutional:  Negative for chills and fever.  HENT:  Negative for congestion, sinus pressure, sinus pain and sore throat.   Eyes:  Negative for pain and discharge.  Respiratory:  Positive for cough, shortness of breath and wheezing.   Cardiovascular:  Negative for chest pain and palpitations.  Gastrointestinal:  Negative for abdominal pain, diarrhea, nausea and vomiting.  Endocrine: Negative for polydipsia and polyuria.       Hot flashes   Genitourinary:  Negative for dysuria and hematuria.  Musculoskeletal:  Positive for arthralgias, back pain and neck pain. Negative for neck stiffness.  Skin:  Negative for rash.  Neurological:  Positive for headaches. Negative for dizziness and weakness.  Psychiatric/Behavioral:  Positive for decreased concentration and sleep disturbance. Negative for agitation and behavioral problems. The patient is nervous/anxious.        Objective:    Physical Exam Vitals reviewed.  Constitutional:      General: She is not in acute distress.    Appearance: She is obese. She is not diaphoretic.  HENT:     Head: Normocephalic and atraumatic.     Nose: Nose normal. No congestion.     Mouth/Throat:     Mouth: Mucous membranes are moist.  Pharynx: No posterior oropharyngeal erythema.  Eyes:     General: No scleral icterus.    Extraocular Movements: Extraocular movements intact.  Neck:     Vascular: No carotid bruit.  Cardiovascular:     Rate and Rhythm: Normal rate and regular rhythm.     Pulses: Normal pulses.     Heart sounds: Murmur (Systolic over upper sternal border) heard.  Pulmonary:     Breath sounds: Wheezing (Mild, diffuse) present. No rales.  Musculoskeletal:     Cervical back: Neck supple. Tenderness present.     Right lower leg: No edema.     Left lower leg: No edema.  Skin:    General: Skin is warm.     Findings: No rash.  Neurological:     General: No focal deficit present.     Mental Status: She is alert and oriented to person, place, and time.     Sensory: No sensory deficit.     Motor: No weakness.  Psychiatric:        Mood and Affect: Mood is anxious.        Behavior: Behavior normal.     BP 138/88 (BP Location: Left Arm)   Pulse (!) 57   Ht 5' 5 (1.651 m)   Wt 217 lb 9.6 oz (98.7 kg)   LMP 09/20/2015 (Exact Date)   SpO2 97%   BMI 36.21 kg/m  Wt Readings from Last 3 Encounters:  05/01/24 217 lb 9.6 oz (98.7 kg)  02/01/24 225 lb (102.1 kg)  01/15/24  224 lb (101.6 kg)        Assessment & Plan:   Problem List Items Addressed This Visit       Cardiovascular and Mediastinum   Essential hypertension   BP Readings from Last 1 Encounters:  05/01/24 138/88   Uncontrolled with Amlodipine  10 mg QD, Spironolactone  50 mg QD and Labetalol  100 mg BID - compliance is questionable Needs to restart Amlodipine , labetalol  and Spironolactone  Had advised to take Labetalol  50 mg BID when she restarts her medicines, but unclear if she takes them Counseled for compliance with the medications Advised DASH diet and moderate exercise/walking, at least 150 mins/week        Respiratory   COPD with exacerbation (HCC) - Primary   Recent worsening of cough with clear expectoration and dyspnea likely due to COPD exacerbation Started empiric azithromycin  Medrol  Dosepak prescribed - advised to remain compliant with her insulin  regimen due to recent hyperglycemia Refilled Stiolto as maintenance inhaler Albuterol  Hailer and neb as needed for dyspnea or wheezing Cheratussin as needed for cough Need to quit smoking      Relevant Medications   azithromycin  (ZITHROMAX ) 250 MG tablet   methylPREDNISolone  (MEDROL  DOSEPAK) 4 MG TBPK tablet   guaiFENesin -codeine  100-10 MG/5ML syrup   Tiotropium Bromide Monohydrate  (SPIRIVA  RESPIMAT) 2.5 MCG/ACT AERS     Endocrine   Uncontrolled type 2 diabetes mellitus with hyperglycemia, with long-term current use of insulin  (HCC)   Lab Results  Component Value Date   HGBA1C 11.1 (A) 11/26/2023   Uncontrolled On Metformin , Jardiance  and Lantus  46 U once daily With NovoLog  8 units 3 times daily, followed by endocrinology - needs follow up Advised to follow diabetic diet On statin and ACEi Diabetic eye exam: Advised to follow up with Ophthalmology for diabetic eye exam        Meds ordered this encounter  Medications   azithromycin  (ZITHROMAX ) 250 MG tablet    Sig: Take 2 tablets on day 1,  then 1 tablet daily on  days 2 through 5    Dispense:  6 tablet    Refill:  0   methylPREDNISolone  (MEDROL  DOSEPAK) 4 MG TBPK tablet    Sig: Take as package instructions.    Dispense:  1 each    Refill:  0   guaiFENesin -codeine  100-10 MG/5ML syrup    Sig: Take 5 mLs by mouth 3 (three) times daily as needed for cough.    Dispense:  120 mL    Refill:  0   Tiotropium Bromide Monohydrate  (SPIRIVA  RESPIMAT) 2.5 MCG/ACT AERS    Sig: Inhale 2 puffs into the lungs daily.    Dispense:  12 g    Refill:  1     Sonya Overholt MARLA Blanch, MD

## 2024-05-01 NOTE — Assessment & Plan Note (Addendum)
 Lab Results  Component Value Date   HGBA1C 11.1 (A) 11/26/2023   Uncontrolled On Metformin , Jardiance  and Lantus  46 U once daily With NovoLog  8 units 3 times daily, followed by endocrinology - needs follow up Advised to follow diabetic diet On statin and ACEi Diabetic eye exam: Advised to follow up with Ophthalmology for diabetic eye exam

## 2024-05-01 NOTE — Patient Instructions (Addendum)
 Please start taking Azithromycin  and Prednisone  as prescribed.  Please take Cheratussin as needed for cough.  Please use Stiolto regularly and use Albuterol  as needed for shortness of breath or wheezing.

## 2024-05-01 NOTE — Assessment & Plan Note (Signed)
 BP Readings from Last 1 Encounters:  05/01/24 138/88   Uncontrolled with Amlodipine  10 mg QD, Spironolactone  50 mg QD and Labetalol  100 mg BID - compliance is questionable Needs to restart Amlodipine , labetalol  and Spironolactone  Had advised to take Labetalol  50 mg BID when she restarts her medicines, but unclear if she takes them Counseled for compliance with the medications Advised DASH diet and moderate exercise/walking, at least 150 mins/week

## 2024-05-01 NOTE — Assessment & Plan Note (Addendum)
 Recent worsening of cough with clear expectoration and dyspnea likely due to COPD exacerbation Started empiric azithromycin  Medrol  Dosepak prescribed - advised to remain compliant with her insulin  regimen due to recent hyperglycemia Refilled Stiolto as maintenance inhaler Albuterol  Hailer and neb as needed for dyspnea or wheezing Cheratussin as needed for cough Need to quit smoking

## 2024-05-07 ENCOUNTER — Ambulatory Visit (INDEPENDENT_AMBULATORY_CARE_PROVIDER_SITE_OTHER): Admitting: Orthopaedic Surgery

## 2024-05-07 ENCOUNTER — Encounter: Payer: Self-pay | Admitting: Orthopaedic Surgery

## 2024-05-07 DIAGNOSIS — G8929 Other chronic pain: Secondary | ICD-10-CM

## 2024-05-07 DIAGNOSIS — M25562 Pain in left knee: Secondary | ICD-10-CM | POA: Diagnosis not present

## 2024-05-07 DIAGNOSIS — M25561 Pain in right knee: Secondary | ICD-10-CM

## 2024-05-07 MED ORDER — METHYLPREDNISOLONE ACETATE 40 MG/ML IJ SUSP
40.0000 mg | Freq: Once | INTRAMUSCULAR | Status: AC
  Administered 2024-05-07: 40 mg via INTRA_ARTICULAR

## 2024-05-07 NOTE — Progress Notes (Signed)
 PROCEDURE NOTE:  The patient requests injections of the right knee , verbal consent was obtained.  The right knee was prepped appropriately after time out was performed.   Sterile technique was observed and injection of 1 cc of DepoMedrol 40mg  with several cc's of plain xylocaine . Anesthesia was provided by ethyl chloride and a 20-gauge needle was used to inject the knee area. The injection was tolerated well.  A band aid dressing was applied.  The patient was advised to apply ice later today and tomorrow to the injection sight as needed.  PROCEDURE NOTE:  The patient requests injections of the left knee , verbal consent was obtained.  The left knee was prepped appropriately after time out was performed.   Sterile technique was observed and injection of 1 cc of DepoMedrol 40 mg with several cc's of plain xylocaine . Anesthesia was provided by ethyl chloride and a 20-gauge needle was used to inject the knee area. The injection was tolerated well.  A band aid dressing was applied.  The patient was advised to apply ice later today and tomorrow to the injection sight as needed.  Encounter Diagnosis  Name Primary?   Chronic pain of both knees Yes   Return in two months.  I have informed the patient I will be retiring from medical practice and from this office on June 12, 2024.  The patient has been offered continuing care with Dr. Margrette or Dr. Onesimo of this office.  The patient may choose another provider and the records will be forwarded after proper signature and notification.  Patient understands and agrees.  She requests to see Dr. Onesimo in the future.  Call if any problem.  Precautions discussed.  Electronically Signed Lemond Stable, MD 8/27/20259:29 AM

## 2024-05-07 NOTE — Addendum Note (Signed)
 Addended by: MARCINE HUSBAND T on: 05/07/2024 01:32 PM   Modules accepted: Orders

## 2024-05-09 ENCOUNTER — Other Ambulatory Visit: Payer: Self-pay | Admitting: *Deleted

## 2024-05-09 NOTE — Patient Instructions (Signed)
 Visit Information  Sonya James was given information about Medicaid Managed Care team care coordination services as a part of their [facility] Plan Medicaid benefit.   If you would like to schedule transportation through your [facility], please call the following number at least 2 days in advance of your appointment: [phone]   Rides for urgent appointments can also be made after hours by calling Member Services.  Call the Behavioral Health Crisis Line at [phone], at any time, 24 hours a day, 7 days a week. If you are in danger or need immediate medical attention call 911.   Sonya James - following are the goals we discussed in your visit today:   Goals Addressed   None     Please see education materials related to Diabetes, COPD Action Plan provided by MyChart link.  Patient verbalizes understanding of instructions and care plan provided today and agrees to view in MyChart. Active MyChart status and patient understanding of how to access instructions and care plan via MyChart confirmed with patient.     No further follow up required:  Patient would benefit from Complex Care Management services for education on DM, COPD, PVD. Patient  stated she does not like talking to people and declines to enroll in the program at this time.  Sonya James, BSN RN [facility], [facility] RN Care Manager Direct Dial : [phone]  Fax: [phone]   Following is a copy of your plan of care:  There are no care plans that you recently modified to display for this patient.

## 2024-05-09 NOTE — Patient Outreach (Signed)
 Complex Care Management   Visit Note  05/09/2024  Name:  Sonya James MRN: [id number] DOB: 01/03/1966  Situation: Referral received for Complex Care Management related to COPD and PVD I obtained verbal consent from Patient.  Visit completed with Patient  on the phone  Background:   Past Medical History:  Diagnosis Date   Anemia    Anxiety    Arthritis    Asthma 05/25/2013   Autonomic neuropathy    Cancer of endometrium (HCC) 10/18/2015   Cellulitis, face 05/03/2021   Constipation    COPD (chronic obstructive pulmonary disease) (HCC)    CTS (carpal tunnel syndrome)    Depression    Diabetes mellitus without complication (HCC)    Dysphagia 12/14/2016   Encounter for screening colonoscopy    Endometrial cancer (HCC) 2017   Gastritis without bleeding    GERD (gastroesophageal reflux disease)    Gross hematuria 05/11/2019   Headache(784.0)    Heavy menses 10/11/2015   HTN (hypertension)    Hypercholesterolemia    Hypothyroidism    IBS (irritable bowel syndrome)    Osteoarthritis    osteoarthritis   Pancreatitis    per patient    Recurrent boils    buttocks and low back.   S/P laparoscopic hysterectomy 11/04/2015   Shortness of breath    Sleep apnea    CPAP machine   Uncontrolled type 2 diabetes mellitus with diabetic polyneuropathy, with long-term current use of insulin  08/04/2017   Uterine cancer (HCC) 2017   Vitamin D  deficiency     Assessment: Patient Reported Symptoms:  Cognitive Cognitive Status: No symptoms reported Cognitive/Intellectual Conditions Management [RPT]: None reported or documented in medical history or problem list   Health Maintenance Behaviors: Annual physical exam Health Facilitated by: Rest  Neurological Neurological Review of Symptoms: Dizziness Neurological Management Strategies: Routine screening Neurological Self-Management Outcome: 3 (uncertain)  HEENT HEENT Symptoms Reported: Sudden change or loss of vision, Eye dryness,  Other: HEENT Management Strategies: Routine screening HEENT Self-Management Outcome: 3 (uncertain) HEENT Comment: Eye appointment in September in [place]    Cardiovascular Cardiovascular Symptoms Reported: Lightheadness, Swelling in legs or feet Does patient have uncontrolled Hypertension?: No Cardiovascular Management Strategies: Routine screening Cardiovascular Self-Management Outcome: 3 (uncertain)  Respiratory Respiratory Symptoms Reported: Shortness of breath, Wheezing, Productive cough Additional Respiratory Details: white sputum Respiratory Management Strategies: Coping strategies, Medication therapy Respiratory Self-Management Outcome: 4 (good)  Endocrine Endocrine Symptoms Reported: Blurry vision, Increased thirst Is patient diabetic?: Yes Is patient checking blood sugars at home?: No Endocrine Self-Management Outcome: 3 (uncertain)  Gastrointestinal Gastrointestinal Symptoms Reported: Diarrhea Gastrointestinal Management Strategies: Medication therapy Gastrointestinal Self-Management Outcome: 4 (good)    Genitourinary Genitourinary Symptoms Reported: No symptoms reported Genitourinary Management Strategies: Medication therapy Genitourinary Self-Management Outcome: 4 (good)  Integumentary Integumentary Symptoms Reported: No symptoms reported Skin Management Strategies: Routine screening Skin Self-Management Outcome: 4 (good)  Musculoskeletal Musculoskelatal Symptoms Reviewed: Joint pain, Difficulty walking Musculoskeletal Management Strategies: Medical device Musculoskeletal Self-Management Outcome: 3 (uncertain) Falls in the past year?: No Number of falls in past year: 1 or less Was there an injury with Fall?: No Fall Risk Category Calculator: 0 Patient Fall Risk Level: Low Fall Risk Patient at Risk for Falls Due to: No Fall Risks Fall risk Follow up: Falls evaluation completed  Psychosocial Psychosocial Symptoms Reported: Anxiety - if selected complete GAD, Depression  - if selected complete PHQ 2-9, Sadness - if selected complete PHQ 2-9 Behavioral Health Self-Management Outcome: 3 (uncertain) Major Change/Loss/Stressor/Fears (CP): Denies Techniques to Cope with  Loss/Stress/Change: Not applicable Quality of Family Relationships: helpful Do you feel physically threatened by others?: No    05/09/2024    PHQ2-9 Depression Screening   Little interest or pleasure in doing things Several days  Feeling down, depressed, or hopeless Nearly every day  PHQ-2 - Total Score 4  Trouble falling or staying asleep, or sleeping too much Several days  Feeling tired or having little energy Several days  Poor appetite or overeating  Several days  Feeling bad about yourself - or that you are a failure or have let yourself or your family down Nearly every day  Trouble concentrating on things, such as reading the newspaper or watching television Not at all  Moving or speaking so slowly that other people could have noticed.  Or the opposite - being so fidgety or restless that you have been moving around a lot more than usual Several days  Thoughts that you would be better off dead, or hurting yourself in some way Not at all  PHQ2-9 Total Score 11  If you checked off any problems, how difficult have these problems made it for you to do your work, take care of things at home, or get along with other people Not difficult at all  Depression Interventions/Treatment Medication    There were no vitals filed for this visit.  Medications Reviewed Today     Reviewed by [name], RN (Registered Nurse) on 05/09/24 at 1144  Med List Status: <None>   Medication Order Taking? Sig Documenting Provider Last Dose Status Informant  Accu-Chek Softclix Lancets lancets [id number] Yes To test glucose 4 times a day Nida, Gebreselassie W, MD  Active Self  albuterol  (PROVENTIL ) (2.5 MG/3ML) 0.083% nebulizer solution [id number] Yes Take 3 mLs (2.5 mg total) by nebulization every 6 (six) hours  as needed for wheezing or shortness of breath. [name], MD  Active   albuterol  (VENTOLIN  HFA) [phone] Base) MCG/ACT inhaler [id number] Yes Inhale 2 puffs into the lungs every 6 (six) hours as needed for wheezing or shortness of breath. [name], MD  Active   amLODipine  (NORVASC ) 10 MG tablet [id number] Yes Take 1 tablet (10 mg total) by mouth daily. [name], MD  Active   apixaban  (ELIQUIS ) 5 MG TABS tablet [id number] Yes Take 1 tablet (5 mg total) by mouth 2 (two) times daily. On 08/12/17 @ 9PM, start 1 tab (5 mg) two times daily. [name], MD  Active   Blood Glucose Monitoring Suppl (ACCU-CHEK GUIDE ME) w/Device KIT [id number] Yes 1 Piece by Does not apply route as directed. Nida, Gebreselassie W, MD  Active Self  Blood Glucose Monitoring Suppl DEVI [id number] Yes 1 each by Does not apply route in the morning, at noon, and at bedtime. May substitute to any manufacturer covered by patient's insurance. [name], MD  Active   clotrimazole  (GYNE-LOTRIMIN  3) 2 % vaginal cream [id number] Yes Place 1 Applicatorful vaginally at bedtime.  Patient taking differently: Place 1 Applicatorful vaginally at bedtime as needed.   Antonetta Rollene BRAVO, MD  Active Self           Med Note GARNET, Mackenna Kamer M   Fri May 09, 2024 11:41 AM)    Continuous Glucose Sensor (DEXCOM G7 SENSOR) [place] [id number] Yes 1 Device by Does not apply route as directed. [name], Ibtehal Jaralla, MD  Active   DULoxetine  (CYMBALTA ) 60 MG capsule [id number] Yes Take 1 capsule (60 mg total) by mouth daily. Tobie, Suzzane POUR,  MD  Active   empagliflozin  (JARDIANCE ) 25 MG TABS tablet [id number] Yes Take 1 tablet (25 mg total) by mouth daily before breakfast. [name], Ibtehal Jaralla, MD  Active   esomeprazole  (NEXIUM ) 40 MG capsule [id number] Yes TAKE 1 CAPSULE BY MOUTH TWICE DAILY BEFORE MEAL(S) Shirlean Therisa ORN, NP  Active   Fezolinetant  (VEOZAH ) 45 MG TABS [id number] Yes Take 1 tablet (45 mg total) by mouth daily. [name], MD  Active   furosemide  (LASIX ) 40 MG tablet [id number] Yes Take 1 tablet by mouth once daily Patel, Rutwik K, MD  Active   glucose blood (ACCU-CHEK GUIDE) test strip [id number] Yes Test glucose 4 times a day Nida, Gebreselassie W, MD  Active Self  guaiFENesin -codeine  100-10 MG/5ML syrup [id number] Yes Take 5 mLs by mouth 3 (three) times daily as needed for cough. [name], MD  Active   insulin  glargine (LANTUS  SOLOSTAR) 100 UNIT/ML Solostar Pen [id number] Yes Inject 46 Units into the skin daily. [name], Ibtehal Jaralla, MD  Active   insulin  lispro (HUMALOG  KWIKPEN) 100 UNIT/ML KwikPen [id number] Yes Inject 8 Units into the skin 3 (three) times daily. [name], [name], MD  Active   Insulin  Pen Needle 31G X 5 MM MISC [id number] Yes 1 Device by Does not apply route in the morning, at noon, in the evening, and at bedtime. [name], Ibtehal Jaralla, MD  Active   labetalol  (NORMODYNE ) 100 MG tablet [id number] Yes Take 1 tablet (100 mg total) by mouth 2 (two) times daily. [name], MD  Active   linaclotide  (LINZESS ) 290 MCG CAPS capsule [id number] Yes TAKE 1 CAPSULE BY MOUTH ONCE DAILY BEFORE BREAKFAST Shirlean Therisa ORN, NP  Active   metFORMIN  (GLUCOPHAGE ) 1000 MG tablet [id number] Yes Take 1 tablet (1,000 mg total) by mouth 2 (two) times daily with a meal. [name], [name], MD  Active   methylPREDNISolone  (MEDROL  DOSEPAK) 4 MG TBPK tablet [id number]  Take as package instructions.  Patient not taking: Reported on 05/09/2024   [name], MD  Consider Medication Status and Discontinue   Misc. Devices MISC [id number] Yes Blood pressure monitoring device [name], MD  Active Self  naloxone [facility]) nasal spray 4 mg/0.1 mL [id number] Yes SMARTSIG:Spray(s) In Nostril [provider]  Active   nitroGLYCERIN (NITRODUR - DOSED IN MG/24 HR) 0.1 mg/hr patch [id number] Yes 0.1 mg daily. [provider]  Active   ondansetron  (ZOFRAN -ODT) 4 MG  disintegrating tablet [id number] Yes Take 1 tablet (4 mg total) by mouth every 6 (six) hours as needed for nausea or vomiting. Shirlean Therisa ORN, NP  Active   RELION PEN NEEDLES 31G X 6 MM MISC [id number] Yes USE 1 PEN NEEDLE ONCE DAILY IN THE AFTERNOON [provider]  Active   rizatriptan (MAXALT) 10 MG tablet [id number] Yes Take 10 mg by mouth daily as needed for migraine. [provider]  Active Self  rosuvastatin  (CRESTOR ) 40 MG tablet [id number] Yes Take 1 tablet (40 mg total) by mouth daily. [name], Ibtehal Jaralla, MD  Active   spironolactone  (ALDACTONE ) 50 MG tablet [id number] Yes Take 1 tablet (50 mg total) by mouth daily. [name], MD  Active   Tiotropium Bromide Monohydrate  (SPIRIVA  RESPIMAT) 2.5 MCG/ACT AERS [id number] Yes Inhale 2 puffs into the lungs daily. [name], MD  Active   traMADol  (ULTRAM ) 50 MG tablet [id number] Yes Take 50 mg by mouth 2 (two) times daily as needed. [provider]  Active   Vitamin D , Ergocalciferol , (  DRISDOL ) 1.25 MG (50000 UNIT) CAPS capsule [id number] Yes Take 1 capsule by mouth once a week Patel, Rutwik K, MD  Active             Recommendation:   Continue Current Plan of Care Keep Eye Appointment  Follow Up Plan:   Closing From:  Complex Care Management Care Management case will be closed. Patient has been provided contact information should new needs arise.  Patient would benefit from Complex Care Management services for education on DM, COPD, PVD. Patient  stated she does not like talking to people and declines to enroll in the program at this time.  [name], BSN RN [facility], [facility] RN Care Manager Direct Dial : [phone]  Fax: [phone]

## 2024-05-30 ENCOUNTER — Encounter: Admitting: Orthopedic Surgery

## 2024-05-30 ENCOUNTER — Encounter: Payer: Self-pay | Admitting: Internal Medicine

## 2024-05-30 ENCOUNTER — Ambulatory Visit (INDEPENDENT_AMBULATORY_CARE_PROVIDER_SITE_OTHER): Admitting: Internal Medicine

## 2024-05-30 VITALS — BP 146/86 | HR 60 | Ht 65.0 in | Wt 215.6 lb

## 2024-05-30 DIAGNOSIS — Z794 Long term (current) use of insulin: Secondary | ICD-10-CM

## 2024-05-30 DIAGNOSIS — Z0001 Encounter for general adult medical examination with abnormal findings: Secondary | ICD-10-CM

## 2024-05-30 DIAGNOSIS — Z5986 Financial insecurity: Secondary | ICD-10-CM | POA: Diagnosis not present

## 2024-05-30 DIAGNOSIS — E1165 Type 2 diabetes mellitus with hyperglycemia: Secondary | ICD-10-CM | POA: Diagnosis not present

## 2024-05-30 DIAGNOSIS — E1142 Type 2 diabetes mellitus with diabetic polyneuropathy: Secondary | ICD-10-CM

## 2024-05-30 DIAGNOSIS — F331 Major depressive disorder, recurrent, moderate: Secondary | ICD-10-CM

## 2024-05-30 DIAGNOSIS — E782 Mixed hyperlipidemia: Secondary | ICD-10-CM | POA: Diagnosis not present

## 2024-05-30 DIAGNOSIS — I10 Essential (primary) hypertension: Secondary | ICD-10-CM | POA: Diagnosis not present

## 2024-05-30 DIAGNOSIS — Z23 Encounter for immunization: Secondary | ICD-10-CM

## 2024-05-30 DIAGNOSIS — J449 Chronic obstructive pulmonary disease, unspecified: Secondary | ICD-10-CM | POA: Diagnosis not present

## 2024-05-30 MED ORDER — DULOXETINE HCL 60 MG PO CPEP: 60.0000 mg | ORAL_CAPSULE | Freq: Every day | ORAL | 1 refills | Status: AC

## 2024-05-30 MED ORDER — AMLODIPINE BESYLATE 10 MG PO TABS: 10.0000 mg | ORAL_TABLET | Freq: Every day | ORAL | 1 refills | Status: AC

## 2024-05-30 MED ORDER — LABETALOL HCL 100 MG PO TABS
50.0000 mg | ORAL_TABLET | Freq: Two times a day (BID) | ORAL | 1 refills | Status: AC
Start: 2024-05-30 — End: ?

## 2024-05-30 MED ORDER — SPIRONOLACTONE 50 MG PO TABS: 50.0000 mg | ORAL_TABLET | Freq: Every day | ORAL | 1 refills | Status: AC

## 2024-05-30 NOTE — Assessment & Plan Note (Signed)
 Flowsheet Row Office Visit from 05/30/2024 in [facility]  PHQ-9 Total Score 11   Uncontrolled, but has variability due to stress from her mother On Cymbalta  60 mg daily, but has not taken it recently - advised to take Cymbalta  regularly for now If persistent, plan to add Vraylar

## 2024-05-30 NOTE — Assessment & Plan Note (Addendum)
 BP Readings from Last 1 Encounters:  05/30/24 (!) 148/82   Uncontrolled with Amlodipine  10 mg QD, Spironolactone  50 mg QD and Labetalol  50 mg BID - compliance is questionable Needs to restart Amlodipine , labetalol  and Spironolactone  Counseled for compliance with the medications Advised DASH diet and moderate exercise/walking, at least 150 mins/week

## 2024-05-30 NOTE — Assessment & Plan Note (Signed)
 Physical exam as documented. Counseling done  re healthy lifestyle involving commitment to 150 minutes exercise per week, heart healthy diet, and attaining healthy weight.The importance of adequate sleep also discussed. Immunization and cancer screening needs are specifically addressed at this visit.

## 2024-05-30 NOTE — Assessment & Plan Note (Signed)
Lipid profile reviewed Last cardiology note reviewed, referred to lipid clinic Currently on statin, plan to start PCSK9 inhibitor therapy 

## 2024-05-30 NOTE — Assessment & Plan Note (Signed)
 Reports difficulty getting her medications on time, which leads to stretching of the medications by not taking some of them on some days  Referred to [facility] team for Pharmacist consultation and social worker

## 2024-05-30 NOTE — Assessment & Plan Note (Signed)
 Recent worsening of cough with clear expectoration and dyspnea likely due to COPD exacerbation, now improved after oral antibiotic and steroids Refilled Stiolto as maintenance inhaler recently Albuterol  inhaler and neb as needed for dyspnea or wheezing Cheratussin as needed for cough Need to quit smoking

## 2024-05-30 NOTE — Patient Instructions (Signed)
 Please continue to take medications as prescribed.  Please continue to follow low carb diet and perform moderate exercise/walking as tolerated.

## 2024-05-30 NOTE — Progress Notes (Signed)
 Established Patient Office Visit  Subjective:  Patient ID: Sonya James, female    DOB: [birth year]  Age: 58 y.o. MRN: [id number]  CC:  Chief Complaint  Patient presents with   Medical Management of Chronic Issues    HPI Sonya James is a 57 y.o. female with past medical history of HTN, type II DM, HLD, COPD, OSA, PE, GERD, obesity and tobacco abuse who presents for f/u of her chronic medical conditions.  HTN: Her BP is elevated today. She has amlodipine  10 mg QD, spironolactone  50 mg QD and labetalol  50 mg BID, but compliance is questionable. She has not taken amlodipine , labetalol  and spironolactone  today.  She denies any chest pain or palpitations. Had lengthy discussion to take medications regularly again. She agrees to start taking medications regularly now.  She states that she tries to stretch her medications due to financial constraints.  Type II DM with HLD: Followed by endocrinology.  Her HbA1c had worsened to 11.1 in 03/25. She is on Lantus  46 units, Metformin  1000 mg BID and Jardiance  25 mg QD.  She has been placed on Humalog  8 units 3 times daily recently, but she is noncompliant.  She takes Lipitor for HLD.  She complains of dyspnea and wheezing intermittently, and has been using albuterol  inhaler for her COPD. Denies any hemoptysis.  MDD: She had felt better with Cymbalta , but has not been taking regularly. She had anxiety, apathy, crying spells and decreased concentration, which is chronic.  She has moved out of her mother's home as she was stressing her out.  She gets anxious when her  mother tries to call her. Denies any SI or HI currently.    Past Medical History:  Diagnosis Date   Anemia    Anxiety    Arthritis    Asthma 05/25/2013   Autonomic neuropathy    Cancer of endometrium (HCC) 10/18/2015   Cellulitis, face 05/03/2021   Constipation    COPD (chronic obstructive pulmonary disease) (HCC)    CTS (carpal tunnel syndrome)    Depression    Diabetes mellitus  without complication (HCC)    Dysphagia 12/14/2016   Encounter for screening colonoscopy    Endometrial cancer (HCC) 2017   Gastritis without bleeding    GERD (gastroesophageal reflux disease)    Gross hematuria 05/11/2019   Headache(784.0)    Heavy menses 10/11/2015   HTN (hypertension)    Hypercholesterolemia    Hypothyroidism    IBS (irritable bowel syndrome)    Osteoarthritis    osteoarthritis   Pancreatitis    per patient    Recurrent boils    buttocks and low back.   S/P laparoscopic hysterectomy 11/04/2015   Shortness of breath    Sleep apnea    CPAP machine   Uncontrolled type 2 diabetes mellitus with diabetic polyneuropathy, with long-term current use of insulin  08/04/2017   Uterine cancer (HCC) 2017   Vitamin D  deficiency     Past Surgical History:  Procedure Laterality Date   ABDOMINAL HYSTERECTOMY  2018   endometrial cancer, surgery done at [facility]    BIOPSY  01/02/2017   Procedure: BIOPSY;  Surgeon: Margo LITTIE Haddock, MD;  Location: [facility];  Service: Endoscopy;;  gastric biopsy   CESAREAN SECTION  1989   COLONOSCOPY WITH PROPOFOL  N/A 01/02/2017   Dr. Haddock: redundant colon, two 2-3 mm polyps in sigmoid colon (hyperplastic)   EAR CYST EXCISION Right 11/29/2021   Procedure: EXCISION OF EXTERNAL EAR LESION;  Surgeon:  Skotnicki, Meghan A, DO;  Location: [facility] OR;  Service: ENT;  Laterality: Right;   ESOPHAGOGASTRODUODENOSCOPY  05/22/2011   Dr. Harvey: H.pylori gastritis    ESOPHAGOGASTRODUODENOSCOPY (EGD) WITH PROPOFOL  N/A 01/02/2017   Dr. Harvey: possible web in proximal esophagus s/p dilation, moderate gastritis (negative H.pylori)   FOOT SURGERY Left    tendon repair   IR US  GUIDE VASC ACCESS LEFT  01/18/2021   OOPHORECTOMY     OTOPLASATY Left 06/15/2021   Procedure: Excision of left auricular lesion with endaural meatoplasty;  Surgeon: Llewellyn Gerard LABOR, DO;  Location: [facility] OR;  Service: ENT;  Laterality: Left;   POLYPECTOMY  01/02/2017   Procedure:  POLYPECTOMY;  Surgeon: Margo LITTIE Harvey, MD;  Location: [facility];  Service: Endoscopy;;  sigmoid colon polyps times 2   REDUCTION MAMMAPLASTY  1996   SAVORY DILATION N/A 01/02/2017   Procedure: SAVORY DILATION;  Surgeon: Margo LITTIE Harvey, MD;  Location: [facility];  Service: Endoscopy;  Laterality: N/A;   TUBAL LIGATION      Family History  Problem Relation Age of Onset   Diabetes Mother    Hypertension Mother    COPD Mother    Asthma Mother    Hypercholesterolemia Mother    Hypertension Sister    Hyperlipidemia Sister    Diabetes Sister    Asthma Brother    Alcohol abuse Brother    Asthma Son    Cancer Maternal Grandmother        lung, throat, breast   Alzheimer's disease Maternal Grandmother    Hypertension Maternal Grandmother    Hypercholesterolemia Maternal Grandmother    Heart disease Maternal Grandfather    Stroke Maternal Grandfather    Clotting disorder Maternal Grandfather        blood clots in legs   Hypertension Sister    Hyperlipidemia Sister    Stroke Maternal Aunt    Clotting disorder Maternal Aunt        blood clots in legs   Hypertension Maternal Aunt    Hypercholesterolemia Maternal Aunt    Hypertension Maternal Aunt    Asthma Maternal Aunt    Clotting disorder Maternal Uncle        blood clot -legs traveled to heart and he died of heart attack   Colon cancer Neg Hx     Social History   Socioeconomic History   Marital status: Married    Spouse name: Not on file   Number of children: 1   Years of education: Not on file   Highest education level: Not on file  Occupational History   Not on file  Tobacco Use   Smoking status: Every Day    Current packs/day: 1.50    Average packs/day: 1.5 packs/day for 22.0 years (33.0 ttl pk-yrs)    Types: Cigarettes   Smokeless tobacco: Never  Vaping Use   Vaping status: Never Used  Substance and Sexual Activity   Alcohol use: No   Drug use: No   Sexual activity: Not Currently    Birth  control/protection: Surgical    Comment: hysterectomy  Other Topics Concern   Not on file  Social History Narrative   Lives with husband and she has been married to for 9 years.  Recently got evicted and has been living in a motel since last July.   She has 1 son that lives in [place].  Reports one grandbaby      Enjoys watching TV mostly animal [place].      Diet:  Eats all food groups   Caffeine: Soda not diet, green tea, coffee about 3 cups of caffeine per day.   Water: Drinks 5-6 8 ounce bottles throughout the day.      Wears a seatbelt.  Currently does not have a vehicle.  Smoke detectors in the facility she is living in.  Does not have any weapons.   Social Drivers of Health   Financial Resource Strain: Patient Declined (10/09/2022)   [facility]   Overall Financial Resource Strain (CARDIA)    Difficulty of Paying Living Expenses: Patient declined  Food Insecurity: Food Insecurity Present (05/09/2024)   Hunger Vital Sign    Worried About Running Out of Food in the Last Year: Often true    Ran Out of Food in the Last Year: Often true  Transportation Needs: No Transportation Needs (05/09/2024)   PRAPARE - Administrator, Civil Service (Medical): No    Lack of Transportation (Non-Medical): No  Physical Activity: Insufficiently Active (04/06/2022)   Exercise Vital Sign    Days of Exercise per Week: 1 day    Minutes of Exercise per Session: 30 min  Stress: Stress Concern Present (04/06/2022)   [employer] of Occupational Health - Occupational Stress Questionnaire    Feeling of Stress : Rather much  Social Connections: Unknown (10/04/2022)   Received from [facility]   Social Network    Social Network: Not on file  Intimate Partner Violence: Not At Risk (05/09/2024)   Humiliation, Afraid, Rape, and Kick questionnaire    Fear of Current or Ex-Partner: No    Emotionally Abused: No    Physically Abused: No    Sexually Abused: No     Outpatient Medications Prior to Visit  Medication Sig Dispense Refill   Accu-Chek Softclix Lancets lancets To test glucose 4 times a day 150 each 2   albuterol  (PROVENTIL ) (2.5 MG/3ML) 0.083% nebulizer solution Take 3 mLs (2.5 mg total) by nebulization every 6 (six) hours as needed for wheezing or shortness of breath. 150 mL 1   albuterol  (VENTOLIN  HFA) 108 (90 Base) MCG/ACT inhaler Inhale 2 puffs into the lungs every 6 (six) hours as needed for wheezing or shortness of breath. 18 g 5   apixaban  (ELIQUIS ) 5 MG TABS tablet Take 1 tablet (5 mg total) by mouth 2 (two) times daily. On 08/12/17 @ 9PM, start 1 tab (5 mg) two times daily. 60 tablet 5   Blood Glucose Monitoring Suppl (ACCU-CHEK GUIDE ME) w/Device KIT 1 Piece by Does not apply route as directed. 1 kit 0   Blood Glucose Monitoring Suppl DEVI 1 each by Does not apply route in the morning, at noon, and at bedtime. May substitute to any manufacturer covered by patient's insurance. 1 each 0   clotrimazole  (GYNE-LOTRIMIN  3) 2 % vaginal cream Place 1 Applicatorful vaginally at bedtime. (Patient taking differently: Place 1 Applicatorful vaginally at bedtime as needed.) 21 g 0   Continuous Glucose Sensor (DEXCOM G7 SENSOR) MISC 1 Device by Does not apply route as directed. 9 each 3   empagliflozin  (JARDIANCE ) 25 MG TABS tablet Take 1 tablet (25 mg total) by mouth daily before breakfast. 90 tablet 3   esomeprazole  (NEXIUM ) 40 MG capsule TAKE 1 CAPSULE BY MOUTH TWICE DAILY BEFORE MEAL(S) 180 capsule 3   furosemide  (LASIX ) 40 MG tablet Take 1 tablet by mouth once daily 90 tablet 0   glucose blood (ACCU-CHEK GUIDE) test strip Test glucose 4 times a day 150 each 2  insulin  glargine (LANTUS  SOLOSTAR) 100 UNIT/ML Solostar Pen Inject 46 Units into the skin daily. 45 mL 4   insulin  lispro (HUMALOG  KWIKPEN) 100 UNIT/ML KwikPen Inject 8 Units into the skin 3 (three) times daily. 30 mL 3   Insulin  Pen Needle 31G X 5 MM MISC 1 Device by Does not apply route  in the morning, at noon, in the evening, and at bedtime. 400 each 3   linaclotide  (LINZESS ) 290 MCG CAPS capsule TAKE 1 CAPSULE BY MOUTH ONCE DAILY BEFORE BREAKFAST 90 capsule 3   metFORMIN  (GLUCOPHAGE ) 1000 MG tablet Take 1 tablet (1,000 mg total) by mouth 2 (two) times daily with a meal. 90 tablet 3   Misc. Devices MISC Blood pressure monitoring device 1 each 0   naloxone (NARCAN) nasal spray 4 mg/0.1 mL SMARTSIG:Spray(s) In Nostril     nitroGLYCERIN (NITRODUR - DOSED IN MG/24 HR) 0.1 mg/hr patch 0.1 mg daily.     ondansetron  (ZOFRAN -ODT) 4 MG disintegrating tablet Take 1 tablet (4 mg total) by mouth every 6 (six) hours as needed for nausea or vomiting. 30 tablet 2   RELION PEN NEEDLES 31G X 6 MM MISC USE 1 PEN NEEDLE ONCE DAILY IN THE AFTERNOON     rizatriptan (MAXALT) 10 MG tablet Take 10 mg by mouth daily as needed for migraine.     rosuvastatin  (CRESTOR ) 40 MG tablet Take 1 tablet (40 mg total) by mouth daily. 90 tablet 3   Tiotropium Bromide Monohydrate  (SPIRIVA  RESPIMAT) 2.5 MCG/ACT AERS Inhale 2 puffs into the lungs daily. 12 g 1   traMADol  (ULTRAM ) 50 MG tablet Take 50 mg by mouth 2 (two) times daily as needed.     amLODipine  (NORVASC ) 10 MG tablet Take 1 tablet (10 mg total) by mouth daily. 90 tablet 1   DULoxetine  (CYMBALTA ) 60 MG capsule Take 1 capsule (60 mg total) by mouth daily. 90 capsule 1   Fezolinetant  (VEOZAH ) 45 MG TABS Take 1 tablet (45 mg total) by mouth daily. 30 tablet 5   guaiFENesin -codeine  100-10 MG/5ML syrup Take 5 mLs by mouth 3 (three) times daily as needed for cough. 120 mL 0   labetalol  (NORMODYNE ) 100 MG tablet Take 1 tablet (100 mg total) by mouth 2 (two) times daily. 180 tablet 1   methylPREDNISolone  (MEDROL  DOSEPAK) 4 MG TBPK tablet Take as package instructions. 1 each 0   spironolactone  (ALDACTONE ) 50 MG tablet Take 1 tablet (50 mg total) by mouth daily. 90 tablet 1   Vitamin D , Ergocalciferol , (DRISDOL ) 1.25 MG (50000 UNIT) CAPS capsule Take 1 capsule by  mouth once a week 12 capsule 1   No facility-administered medications prior to visit.    Allergies  Allergen Reactions   Ibuprofen      dizziness   Lisinopril  Swelling    Swelling in face    ROS Review of Systems  Constitutional:  Negative for chills and fever.  HENT:  Negative for congestion, sinus pressure, sinus pain and sore throat.   Eyes:  Negative for pain and discharge.  Respiratory:  Negative for cough, shortness of breath and wheezing.   Cardiovascular:  Negative for chest pain and palpitations.  Gastrointestinal:  Negative for abdominal pain, diarrhea, nausea and vomiting.  Endocrine: Negative for polydipsia and polyuria.       Hot flashes  Genitourinary:  Negative for dysuria and hematuria.  Musculoskeletal:  Positive for arthralgias, back pain and neck pain. Negative for neck stiffness.  Skin:  Negative for rash.  Neurological:  Positive for headaches.  Negative for dizziness and weakness.  Psychiatric/Behavioral:  Positive for decreased concentration and sleep disturbance. Negative for agitation and behavioral problems. The patient is nervous/anxious.       Objective:    Physical Exam Vitals reviewed.  Constitutional:      General: She is not in acute distress.    Appearance: She is obese. She is not diaphoretic.  HENT:     Head: Normocephalic and atraumatic.     Nose: Nose normal. No congestion.     Mouth/Throat:     Mouth: Mucous membranes are moist.     Pharynx: No posterior oropharyngeal erythema.  Eyes:     General: No scleral icterus.    Extraocular Movements: Extraocular movements intact.  Neck:     Vascular: No carotid bruit.  Cardiovascular:     Rate and Rhythm: Normal rate and regular rhythm.     Pulses: Normal pulses.     Heart sounds: Murmur (Systolic over upper sternal border) heard.  Pulmonary:     Breath sounds: Normal breath sounds. No rales.  Abdominal:     Palpations: Abdomen is soft.     Tenderness: There is no abdominal  tenderness.  Musculoskeletal:     Cervical back: Neck supple. Tenderness present.     Right lower leg: No edema.     Left lower leg: No edema.  Skin:    General: Skin is warm.     Findings: No rash.  Neurological:     General: No focal deficit present.     Mental Status: She is alert and oriented to person, place, and time.     Sensory: No sensory deficit.     Motor: No weakness.  Psychiatric:        Mood and Affect: Mood is anxious.        Behavior: Behavior is cooperative.     BP (!) 146/86 (BP Location: Left Arm)   Pulse 60   Ht 5' 5 (1.651 m)   Wt 215 lb 9.6 oz (97.8 kg)   LMP 09/20/2015 (Exact Date)   SpO2 96%   BMI 35.88 kg/m  Wt Readings from Last 3 Encounters:  05/30/24 215 lb 9.6 oz (97.8 kg)  05/01/24 217 lb 9.6 oz (98.7 kg)  02/01/24 225 lb (102.1 kg)    Lab Results  Component Value Date   TSH 1.670 05/08/2023   Lab Results  Component Value Date   WBC 6.7 01/15/2024   HGB 15.1 (H) 01/15/2024   HCT 46.2 (H) 01/15/2024   MCV 93.1 01/15/2024   PLT 288 01/15/2024   Lab Results  Component Value Date   NA 133 (L) 01/15/2024   K 3.7 01/15/2024   CO2 22 01/15/2024   GLUCOSE 365 (H) 01/15/2024   BUN 10 01/15/2024   CREATININE 0.81 01/15/2024   BILITOT 0.4 01/15/2024   ALKPHOS 119 01/15/2024   AST 15 01/15/2024   ALT 13 01/15/2024   PROT 7.6 01/15/2024   ALBUMIN 3.9 01/15/2024   CALCIUM  9.8 01/15/2024   ANIONGAP 11 01/15/2024   EGFR 81 08/13/2023   Lab Results  Component Value Date   CHOL 213 (H) 08/13/2023   Lab Results  Component Value Date   HDL 47 08/13/2023   Lab Results  Component Value Date   LDLCALC 147 (H) 08/13/2023   Lab Results  Component Value Date   TRIG 103 08/13/2023   Lab Results  Component Value Date   CHOLHDL 4.5 (H) 08/13/2023   Lab Results  Component Value  Date   HGBA1C 11.1 (A) 11/26/2023      Assessment & Plan:   Problem List Items Addressed This Visit       Cardiovascular and Mediastinum    Essential hypertension   BP Readings from Last 1 Encounters:  05/30/24 (!) 148/82   Uncontrolled with Amlodipine  10 mg QD, Spironolactone  50 mg QD and Labetalol  50 mg BID - compliance is questionable Needs to restart Amlodipine , labetalol  and Spironolactone  Counseled for compliance with the medications Advised DASH diet and moderate exercise/walking, at least 150 mins/week      Relevant Medications   amLODipine  (NORVASC ) 10 MG tablet   labetalol  (NORMODYNE ) 100 MG tablet   spironolactone  (ALDACTONE ) 50 MG tablet   Other Relevant Orders   AMB Referral [facility] Care Management   CBC with Differential/Platelet   TSH + free T4     Respiratory   Chronic obstructive pulmonary disease (HCC)   Recent worsening of cough with clear expectoration and dyspnea likely due to COPD exacerbation, now improved after oral antibiotic and steroids Refilled Stiolto as maintenance inhaler recently Albuterol  inhaler and neb as needed for dyspnea or wheezing Cheratussin as needed for cough Need to quit smoking        Endocrine   Uncontrolled type 2 diabetes mellitus with hyperglycemia, with long-term current use of insulin  (HCC)   Lab Results  Component Value Date   HGBA1C 11.1 (A) 11/26/2023   Uncontrolled due to noncompliance On Metformin , Jardiance  and Lantus  46 U once daily With NovoLog  8 units 3 times daily, followed by endocrinology - needs follow up Advised to follow diabetic diet On statin and ACEi Diabetic eye exam: Advised to follow up with Ophthalmology for diabetic eye exam      Relevant Orders   AMB Referral [facility] Care Management   CMP14+EGFR   Hemoglobin A1c   Type 2 diabetes mellitus with diabetic polyneuropathy, with long-term current use of insulin  (HCC)   Continue Cymbalta  for neuropathy      Relevant Medications   DULoxetine  (CYMBALTA ) 60 MG capsule     Other   Mixed hyperlipidemia   Lipid profile reviewed Last cardiology note reviewed, referred to lipid  clinic Currently on statin, plan to start PCSK9 inhibitor therapy      Relevant Medications   amLODipine  (NORVASC ) 10 MG tablet   labetalol  (NORMODYNE ) 100 MG tablet   spironolactone  (ALDACTONE ) 50 MG tablet   Other Relevant Orders   Lipid Profile   Encounter for general adult medical examination with abnormal findings - Primary   Physical exam as documented. Counseling done  re healthy lifestyle involving commitment to 150 minutes exercise per week, heart healthy diet, and attaining healthy weight.The importance of adequate sleep also discussed. Immunization and cancer screening needs are specifically addressed at this visit.      Moderate episode of recurrent major depressive disorder (HCC)   Flowsheet Row Office Visit from 05/30/2024 in [facility]  PHQ-9 Total Score 11   Uncontrolled, but has variability due to stress from her mother On Cymbalta  60 mg daily, but has not taken it recently - advised to take Cymbalta  regularly for now If persistent, plan to add Vraylar      Relevant Medications   DULoxetine  (CYMBALTA ) 60 MG capsule   Other Relevant Orders   AMB Referral [facility] Care Management   TSH + free T4   Financial insecurity   Reports difficulty getting her medications on time, which leads to stretching of the medications by not taking  some of them on some days  Referred to [facility] team for Pharmacist consultation and social worker      Relevant Orders   AMB Referral [facility] Care Management   Other Visit Diagnoses       Encounter for immunization       Relevant Orders   Flu vaccine trivalent PF, 6mos and older(Flulaval,Afluria,Fluarix,Fluzone) (Completed)         Meds ordered this encounter  Medications   amLODipine  (NORVASC ) 10 MG tablet    Sig: Take 1 tablet (10 mg total) by mouth daily.    Dispense:  90 tablet    Refill:  1   DULoxetine  (CYMBALTA ) 60 MG capsule    Sig: Take 1 capsule (60 mg total) by mouth daily.    Dispense:  90  capsule    Refill:  1   labetalol  (NORMODYNE ) 100 MG tablet    Sig: Take 0.5 tablets (50 mg total) by mouth 2 (two) times daily.    Dispense:  90 tablet    Refill:  1   spironolactone  (ALDACTONE ) 50 MG tablet    Sig: Take 1 tablet (50 mg total) by mouth daily.    Dispense:  90 tablet    Refill:  1    Follow-up: Return in about 4 months (around 09/29/2024) for DM and HTN.    Suzzane MARLA Blanch, MD

## 2024-05-30 NOTE — Assessment & Plan Note (Signed)
Continue Cymbalta for neuropathy.

## 2024-05-30 NOTE — Assessment & Plan Note (Addendum)
 Lab Results  Component Value Date   HGBA1C 11.1 (A) 11/26/2023   Uncontrolled due to noncompliance On Metformin , Jardiance  and Lantus  46 U once daily With NovoLog  8 units 3 times daily, followed by endocrinology - needs follow up Advised to follow diabetic diet On statin and ACEi Diabetic eye exam: Advised to follow up with Ophthalmology for diabetic eye exam

## 2024-06-02 LAB — CBC WITH DIFFERENTIAL/PLATELET
Basophils Absolute: 0.1 x10E3/uL (ref 0.0–0.2)
Basos: 1 %
EOS (ABSOLUTE): 0.3 x10E3/uL (ref 0.0–0.4)
Eos: 4 %
Hematocrit: 46 % (ref 34.0–46.6)
Hemoglobin: 14.5 g/dL (ref 11.1–15.9)
Immature Grans (Abs): 0 x10E3/uL (ref 0.0–0.1)
Immature Granulocytes: 0 %
Lymphocytes Absolute: 2.2 x10E3/uL (ref 0.7–3.1)
Lymphs: 33 %
MCH: 30.1 pg (ref 26.6–33.0)
MCHC: 31.5 g/dL (ref 31.5–35.7)
MCV: 96 fL (ref 79–97)
Monocytes Absolute: 0.5 x10E3/uL (ref 0.1–0.9)
Monocytes: 7 %
Neutrophils Absolute: 3.5 x10E3/uL (ref 1.4–7.0)
Neutrophils: 54 %
Platelets: 258 x10E3/uL (ref 150–450)
RBC: 4.81 x10E6/uL (ref 3.77–5.28)
RDW: 11.6 % — ABNORMAL LOW (ref 11.7–15.4)
WBC: 6.6 x10E3/uL (ref 3.4–10.8)

## 2024-06-02 LAB — CMP14+EGFR
ALT: 10 IU/L (ref 0–32)
AST: 7 IU/L (ref 0–40)
Albumin: 3.8 g/dL (ref 3.8–4.9)
Alkaline Phosphatase: 112 IU/L (ref 49–135)
BUN/Creatinine Ratio: 8 — ABNORMAL LOW (ref 9–23)
BUN: 6 mg/dL (ref 6–24)
Bilirubin Total: 0.4 mg/dL (ref 0.0–1.2)
CO2: 26 mmol/L (ref 20–29)
Calcium: 9.5 mg/dL (ref 8.7–10.2)
Chloride: 101 mmol/L (ref 96–106)
Creatinine, Ser: 0.72 mg/dL (ref 0.57–1.00)
Globulin, Total: 2.5 g/dL (ref 1.5–4.5)
Glucose: 383 mg/dL — ABNORMAL HIGH (ref 70–99)
Potassium: 4.8 mmol/L (ref 3.5–5.2)
Sodium: 136 mmol/L (ref 134–144)
Total Protein: 6.3 g/dL (ref 6.0–8.5)
eGFR: 97 mL/min/1.73 (ref >59)

## 2024-06-02 LAB — HEMOGLOBIN A1C
Est. average glucose Bld gHb Est-mCnc: >398 mg/dL
Hgb A1c MFr Bld: >15.5 % — AB (ref 4.8–5.6)

## 2024-06-02 LAB — TSH+FREE T4
Free T4: 1.35 ng/dL (ref 0.82–1.77)
TSH: 1.68 u[IU]/mL (ref 0.450–4.500)

## 2024-06-02 LAB — LIPID PANEL
Chol/HDL Ratio: 5.7 ratio — ABNORMAL HIGH (ref 0.0–4.4)
Cholesterol, Total: 258 mg/dL — ABNORMAL HIGH (ref 100–199)
HDL: 45 mg/dL (ref >39)
LDL Chol Calc (NIH): 197 mg/dL — ABNORMAL HIGH (ref 0–99)
Triglycerides: 93 mg/dL (ref 0–149)
VLDL Cholesterol Cal: 16 mg/dL (ref 5–40)

## 2024-06-03 ENCOUNTER — Ambulatory Visit: Admitting: Orthopedic Surgery

## 2024-06-03 ENCOUNTER — Other Ambulatory Visit: Payer: Self-pay

## 2024-06-03 ENCOUNTER — Ambulatory Visit: Payer: Self-pay | Admitting: Internal Medicine

## 2024-06-03 ENCOUNTER — Other Ambulatory Visit (INDEPENDENT_AMBULATORY_CARE_PROVIDER_SITE_OTHER): Payer: Self-pay

## 2024-06-03 ENCOUNTER — Encounter: Payer: Self-pay | Admitting: Orthopedic Surgery

## 2024-06-03 VITALS — BP 157/86 | HR 60 | Ht 65.0 in | Wt 212.0 lb

## 2024-06-03 DIAGNOSIS — M79672 Pain in left foot: Secondary | ICD-10-CM

## 2024-06-03 DIAGNOSIS — M79671 Pain in right foot: Secondary | ICD-10-CM

## 2024-06-03 DIAGNOSIS — L84 Corns and callosities: Secondary | ICD-10-CM | POA: Diagnosis not present

## 2024-06-03 NOTE — Progress Notes (Signed)
 New Patient Visit  Assessment: Sonya James is a 58 y.o. female with the following: 1. Foot pain, bilateral 2. Corn of foot  Plan: DREW HERMAN has hard corns on bilateral plantar feet.  Left is worse than right.  Discussed using over-the-counter treatments.  Advised her not to be too aggressive, she does have a history of diabetes.  She has previously seen a podiatrist, but has been unable to secure an appointment.  We discussed this in clinic, and her previous podiatrist has changed clinics.  Provided referral so she can be evaluated by the same podiatrist at his new clinic.  Follow-up: Return if symptoms worsen or fail to improve.  Subjective:  Chief Complaint  Patient presents with   Foot Pain    Bilat nodules for 2-3 wks R > L, pt is also a diabetic    History of Present Illness: Sonya James is a 58 y.o. female who presents for evaluation of bilateral foot pain.  She states that she has had pain in the plantar aspect of bilateral feet for the past 2-3 weeks.  No specific injury.  It is painful to walk.  No prior treatments.  She is a diabetic.  She has previously been followed by podiatrist, but has been unable to secure an appointment.   Review of Systems: No fevers or chills No numbness or tingling No chest pain No shortness of breath No bowel or bladder dysfunction No GI distress No headaches   Medical History:  Past Medical History:  Diagnosis Date   Anemia    Anxiety    Arthritis    Asthma 05/25/2013   Autonomic neuropathy    Cancer of endometrium (HCC) 10/18/2015   Cellulitis, face 05/03/2021   Constipation    COPD (chronic obstructive pulmonary disease) (HCC)    CTS (carpal tunnel syndrome)    Depression    Diabetes mellitus without complication (HCC)    Dysphagia 12/14/2016   Encounter for screening colonoscopy    Endometrial cancer (HCC) 2017   Gastritis without bleeding    GERD (gastroesophageal reflux disease)    Gross hematuria 05/11/2019    Headache(784.0)    Heavy menses 10/11/2015   HTN (hypertension)    Hypercholesterolemia    Hypothyroidism    IBS (irritable bowel syndrome)    Osteoarthritis    osteoarthritis   Pancreatitis    per patient    Recurrent boils    buttocks and low back.   S/P laparoscopic hysterectomy 11/04/2015   Shortness of breath    Sleep apnea    CPAP machine   Uncontrolled type 2 diabetes mellitus with diabetic polyneuropathy, with long-term current use of insulin  08/04/2017   Uterine cancer (HCC) 2017   Vitamin D  deficiency     Past Surgical History:  Procedure Laterality Date   ABDOMINAL HYSTERECTOMY  2018   endometrial cancer, surgery done at [facility]    BIOPSY  01/02/2017   Procedure: BIOPSY;  Surgeon: Margo LITTIE Haddock, MD;  [facility];  Service: Endoscopy;;  gastric biopsy   CESAREAN SECTION  1989   COLONOSCOPY WITH PROPOFOL  N/A 01/02/2017   Dr. Haddock: redundant colon, two 2-3 mm polyps in sigmoid colon (hyperplastic)   EAR CYST EXCISION Right 11/29/2021   Procedure: EXCISION OF EXTERNAL EAR LESION;  Surgeon: Llewellyn Gerard DELENA, DO;  Location: [facility] OR;  Service: ENT;  Laterality: Right;   ESOPHAGOGASTRODUODENOSCOPY  05/22/2011   Dr. Haddock: H.pylori gastritis    ESOPHAGOGASTRODUODENOSCOPY (EGD) WITH PROPOFOL  N/A  01/02/2017   Dr. Harvey: possible web in proximal esophagus s/p dilation, moderate gastritis (negative H.pylori)   FOOT SURGERY Left    tendon repair   IR US  GUIDE VASC ACCESS LEFT  01/18/2021   OOPHORECTOMY     OTOPLASATY Left 06/15/2021   Procedure: Excision of left auricular lesion with endaural meatoplasty;  Surgeon: Llewellyn Gerard LABOR, DO;  Location: [facility];  Service: ENT;  Laterality: Left;   POLYPECTOMY  01/02/2017   Procedure: POLYPECTOMY;  Surgeon: Margo LITTIE Harvey, MD;  [facility];  Service: Endoscopy;;  sigmoid colon polyps times 2   REDUCTION MAMMAPLASTY  1996   SAVORY DILATION N/A 01/02/2017   Procedure: SAVORY DILATION;  Surgeon: Margo LITTIE Harvey, MD;  [facility];  Service: Endoscopy;  Laterality: N/A;   TUBAL LIGATION      Family History  Problem Relation Age of Onset   Diabetes Mother    Hypertension Mother    COPD Mother    Asthma Mother    Hypercholesterolemia Mother    Hypertension Sister    Hyperlipidemia Sister    Diabetes Sister    Asthma Brother    Alcohol abuse Brother    Asthma Son    Cancer Maternal Grandmother        lung, throat, breast   Alzheimer's disease Maternal Grandmother    Hypertension Maternal Grandmother    Hypercholesterolemia Maternal Grandmother    Heart disease Maternal Grandfather    Stroke Maternal Grandfather    Clotting disorder Maternal Grandfather        blood clots in legs   Hypertension Sister    Hyperlipidemia Sister    Stroke Maternal Aunt    Clotting disorder Maternal Aunt        blood clots in legs   Hypertension Maternal Aunt    Hypercholesterolemia Maternal Aunt    Hypertension Maternal Aunt    Asthma Maternal Aunt    Clotting disorder Maternal Uncle        blood clot -legs traveled to heart and he died of heart attack   Colon cancer Neg Hx    Social History   Tobacco Use   Smoking status: Every Day    Current packs/day: 1.50    Average packs/day: 1.5 packs/day for 22.0 years (33.0 ttl pk-yrs)    Types: Cigarettes   Smokeless tobacco: Never  Vaping Use   Vaping status: Never Used  Substance Use Topics   Alcohol use: No   Drug use: No    Allergies  Allergen Reactions   Ibuprofen      dizziness   Lisinopril  Swelling    Swelling in face    Current Meds  Medication Sig   Accu-Chek Softclix Lancets lancets To test glucose 4 times a day   albuterol  (PROVENTIL ) (2.5 MG/3ML) 0.083% nebulizer solution Take 3 mLs (2.5 mg total) by nebulization every 6 (six) hours as needed for wheezing or shortness of breath.   albuterol  (VENTOLIN  HFA) 108 (90 Base) MCG/ACT inhaler Inhale 2 puffs into the lungs every 6 (six) hours as needed for wheezing or  shortness of breath.   amLODipine  (NORVASC ) 10 MG tablet Take 1 tablet (10 mg total) by mouth daily.   apixaban  (ELIQUIS ) 5 MG TABS tablet Take 1 tablet (5 mg total) by mouth 2 (two) times daily. On 08/12/17 @ 9PM, start 1 tab (5 mg) two times daily.   Blood Glucose Monitoring Suppl (ACCU-CHEK GUIDE ME) w/Device KIT 1 Piece by Does not apply route as  directed.   Blood Glucose Monitoring Suppl DEVI 1 each by Does not apply route in the morning, at noon, and at bedtime. May substitute to any manufacturer covered by patient's insurance.   clotrimazole  (GYNE-LOTRIMIN  3) 2 % vaginal cream Place 1 Applicatorful vaginally at bedtime. (Patient taking differently: Place 1 Applicatorful vaginally at bedtime as needed.)   Continuous Glucose Sensor (DEXCOM G7 SENSOR) MISC 1 Device by Does not apply route as directed.   DULoxetine  (CYMBALTA ) 60 MG capsule Take 1 capsule (60 mg total) by mouth daily.   empagliflozin  (JARDIANCE ) 25 MG TABS tablet Take 1 tablet (25 mg total) by mouth daily before breakfast.   esomeprazole  (NEXIUM ) 40 MG capsule TAKE 1 CAPSULE BY MOUTH TWICE DAILY BEFORE MEAL(S)   furosemide  (LASIX ) 40 MG tablet Take 1 tablet by mouth once daily   glucose blood (ACCU-CHEK GUIDE) test strip Test glucose 4 times a day   insulin  glargine (LANTUS  SOLOSTAR) 100 UNIT/ML Solostar Pen Inject 46 Units into the skin daily.   insulin  lispro (HUMALOG  KWIKPEN) 100 UNIT/ML KwikPen Inject 8 Units into the skin 3 (three) times daily.   Insulin  Pen Needle 31G X 5 MM MISC 1 Device by Does not apply route in the morning, at noon, in the evening, and at bedtime.   labetalol  (NORMODYNE ) 100 MG tablet Take 0.5 tablets (50 mg total) by mouth 2 (two) times daily.   linaclotide  (LINZESS ) 290 MCG CAPS capsule TAKE 1 CAPSULE BY MOUTH ONCE DAILY BEFORE BREAKFAST   metFORMIN  (GLUCOPHAGE ) 1000 MG tablet Take 1 tablet (1,000 mg total) by mouth 2 (two) times daily with a meal.   Misc. Devices MISC Blood pressure monitoring device    naloxone (NARCAN) nasal spray 4 mg/0.1 mL SMARTSIG:Spray(s) In Nostril   nitroGLYCERIN (NITRODUR - DOSED IN MG/24 HR) 0.1 mg/hr patch 0.1 mg daily.   ondansetron  (ZOFRAN -ODT) 4 MG disintegrating tablet Take 1 tablet (4 mg total) by mouth every 6 (six) hours as needed for nausea or vomiting.   RELION PEN NEEDLES 31G X 6 MM MISC USE 1 PEN NEEDLE ONCE DAILY IN THE AFTERNOON   rizatriptan (MAXALT) 10 MG tablet Take 10 mg by mouth daily as needed for migraine.   rosuvastatin  (CRESTOR ) 40 MG tablet Take 1 tablet (40 mg total) by mouth daily.   spironolactone  (ALDACTONE ) 50 MG tablet Take 1 tablet (50 mg total) by mouth daily.   Tiotropium Bromide Monohydrate  (SPIRIVA  RESPIMAT) 2.5 MCG/ACT AERS Inhale 2 puffs into the lungs daily.   traMADol  (ULTRAM ) 50 MG tablet Take 50 mg by mouth 2 (two) times daily as needed.    Objective: BP (!) 157/86   Pulse 60   Ht 5' 5 (1.651 m)   Wt 212 lb (96.2 kg)   LMP 09/20/2015 (Exact Date)   BMI 35.28 kg/m   Physical Exam:  General: Alert and oriented. and No acute distress. Gait: Left sided antalgic gait.  Bilateral feet without deformity.  Hard corn on the plantar aspect of bilateral forefoot.  Toes are warm and well-perfused.  IMAGING: I personally ordered and reviewed the following images  X-rays of bilateral feet were obtained in clinic today.  These are nonweightbearing views.  No acute injury.  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  There are plantar spurs on the calcaneus.  In the right foot, there are some well-corticated bony fragments at the insertion of the Achilles.  Evidence of prior surgery in the lateral left foot.  Impression: Bilateral foot x-rays without acute injury   New Medications:  No orders of the defined types were placed in this encounter.     Oneil DELENA Horde, MD  06/03/2024 2:06 PM

## 2024-06-03 NOTE — Patient Instructions (Signed)
 Referral to Dr. Christine, [facility]

## 2024-06-04 ENCOUNTER — Telehealth: Payer: Self-pay

## 2024-06-04 NOTE — Progress Notes (Signed)
 Care Guide Pharmacy Note  06/04/2024 Name: Sonya James MRN: [id number] DOB: 18-Aug-1966  Referred By: [name], MD Reason for referral: Complex Care Management (Outreach to schedule with Pharm d )   Sonya James is a 58 y.o. year old female who is a primary care patient of [name], MD.  Sonya James was referred to the pharmacist for assistance related to: DMII  Successful contact was made with the patient to discuss pharmacy services including being ready for the pharmacist to call at least 5 minutes before the scheduled appointment time and to have medication bottles and any blood pressure readings ready for review. The patient agreed to meet with the pharmacist via telephone visit on (date/time).06/12/2024  Jeoffrey Buffalo , RMA     [place]  [facility], [facility] Guide  Direct Dial : [phone]  Website: [email or web address]

## 2024-06-12 ENCOUNTER — Other Ambulatory Visit

## 2024-06-12 DIAGNOSIS — Z59869 Financial insecurity, unspecified: Secondary | ICD-10-CM

## 2024-06-12 DIAGNOSIS — E1142 Type 2 diabetes mellitus with diabetic polyneuropathy: Secondary | ICD-10-CM

## 2024-06-12 DIAGNOSIS — F331 Major depressive disorder, recurrent, moderate: Secondary | ICD-10-CM

## 2024-06-12 NOTE — Progress Notes (Unsigned)
 06/12/2024 Name: Sonya James MRN: [id number] DOB: 03-Jul-1966  No chief complaint on file.   Sonya James is a 58 y.o. year old female who presented for a telephone visit.   They were referred to the pharmacist by their PCP for assistance in managing medication access and Medication Adherence.   Subjective:  Care Team: Primary Care Provider: [name], MD ; Next Scheduled Visit:  Future Appointments  Date Time Provider Department Center  06/12/2024 11:00 AM [facility]-PHARMACIST [facility] [address]  06/18/2024  9:15 AM [name], DPM [facility]  07/04/2024 10:20 AM [name], MD [facility] [address]  07/08/2024 11:45 AM Onesimo Oneil DELENA, MD [facility] None  01/14/2025  9:30 AM [facility] None  01/14/2025 10:30 AM [name] HERO, PA-C [facility] None    Medication Access/Adherence  Current Pharmacy:  [facility] [address] [place] [address] [place] [address] Phone: [phone] Fax: [phone]  [facility] [place], [place] - [address] [place] [address] [place] [address] Phone: [phone] Fax: [phone]  [facility] [address] [place] [address] Phone: [phone] Fax: [phone]   Patient reports affordability concerns with their medications: Yes  Patient reports access/transportation concerns to their pharmacy: Yes, however recently has started transportation benefit again.   Patient reports adherence concerns with their medications:  No    Medication Management:  Current adherence strategy:  Self manages medications in bottles  Patient reports Good adherence to medications. For some time had been off track with taking medications, recently   Objective:  Lab Results  Component Value Date   HGBA1C >15.5 (H) 05/30/2024    Lab Results  Component Value Date   CREATININE 0.72 05/30/2024   BUN 6  05/30/2024   NA 136 05/30/2024   K 4.8 05/30/2024   CL 101 05/30/2024   CO2 26 05/30/2024    Lab Results  Component Value Date   CHOL 258 (H) 05/30/2024   HDL 45 05/30/2024   LDLCALC 197 (H) 05/30/2024   TRIG 93 05/30/2024   CHOLHDL 5.7 (H) 05/30/2024    Medications Reviewed Today     Reviewed by Pandora Cadet, [facility] (Pharmacist) on 06/12/24 at 1148  Med List Status: <None>   Medication Order Taking? Sig Documenting Provider Last Dose Status Informant  Accu-Chek Softclix Lancets lancets [id number]  To test glucose 4 times a day Nida, Gebreselassie W, MD  Active Self  albuterol  (PROVENTIL ) (2.5 MG/3ML) 0.083% nebulizer solution [id number]  Take 3 mLs (2.5 mg total) by nebulization every 6 (six) hours as needed for wheezing or shortness of breath. [name], MD  Active   albuterol  (VENTOLIN  HFA) 108 [id number] Base) MCG/ACT inhaler [id number]  Inhale 2 puffs into the lungs every 6 (six) hours as needed for wheezing or shortness of breath. [name], MD  Active   amLODipine  (NORVASC ) 10 MG tablet [id number] Yes Take 1 tablet (10 mg total) by mouth daily. [name], MD  Active   apixaban  (ELIQUIS ) 5 MG TABS tablet [id number] Yes Take 1 tablet (5 mg total) by mouth 2 (two) times daily. On 08/12/17 @ 9PM, start 1 tab (5 mg) two times daily. [name], MD  Active   Blood Glucose Monitoring Suppl (ACCU-CHEK GUIDE ME) w/Device KIT [id number]  1 Piece by Does not apply route as directed. Nida, Gebreselassie W, MD  Active Self  Blood Glucose Monitoring Suppl DEVI [id number]  1 each by Does not apply route in the morning, at noon, and at bedtime. May substitute to any manufacturer covered by patient's insurance. [name], MD  Active   clotrimazole  (GYNE-LOTRIMIN  3) 2 % vaginal cream [id number]  Place 1 Applicatorful vaginally at bedtime.  Patient taking differently: Place 1 Applicatorful vaginally at bedtime as needed.   Antonetta Rollene BRAVO, MD  Active Self           Med Note  GARNET, ASHLEY M   Fri May 09, 2024 11:41 AM)    Continuous Glucose Sensor (DEXCOM G7 SENSOR) [place] [id number]  1 Device by Does not apply route as directed. [name], Ibtehal Jaralla, MD  Active   DULoxetine  (CYMBALTA ) 60 MG capsule [id number] Yes Take 1 capsule (60 mg total) by mouth daily. [name], MD  Active   empagliflozin  (JARDIANCE ) 25 MG TABS tablet [id number] Yes Take 1 tablet (25 mg total) by mouth daily before breakfast. [name], Ibtehal Jaralla, MD  Active   esomeprazole  (NEXIUM ) 40 MG capsule [id number]  TAKE 1 CAPSULE BY MOUTH TWICE DAILY BEFORE MEAL(S) Shirlean Therisa ORN, NP  Active   furosemide  (LASIX ) 40 MG tablet [id number] Yes Take 1 tablet by mouth once daily Patel, Rutwik K, MD  Active   glucose blood (ACCU-CHEK GUIDE) test strip [id number]  Test glucose 4 times a day Nida, Gebreselassie W, MD  Active Self  insulin  glargine (LANTUS  SOLOSTAR) 100 UNIT/ML Solostar Pen [id number] Yes Inject 46 Units into the skin daily. [name], Ibtehal Jaralla, MD  Active   insulin  lispro (HUMALOG  KWIKPEN) 100 UNIT/ML KwikPen [id number] Yes Inject 8 Units into the skin 3 (three) times daily. [name], [name], MD  Active   Insulin  Pen Needle 31G X 5 MM MISC [id number]  1 Device by Does not apply route in the morning, at noon, in the evening, and at bedtime. [name], Ibtehal Jaralla, MD  Active   labetalol  (NORMODYNE ) 100 MG tablet [id number] Yes Take 0.5 tablets (50 mg total) by mouth 2 (two) times daily. [name], MD  Active   linaclotide  (LINZESS ) 290 MCG CAPS capsule [id number] Yes TAKE 1 CAPSULE BY MOUTH ONCE DAILY BEFORE BREAKFAST Shirlean Therisa ORN, NP  Active   metFORMIN  (GLUCOPHAGE ) 1000 MG tablet [id number] Yes Take 1 tablet (1,000 mg total) by mouth 2 (two) times daily with a meal. [name], [name], MD  Active   Misc. Devices MISC [id number]  Blood pressure monitoring device [name], MD  Active Self  naloxone [facility]) nasal spray 4 mg/0.1 mL [id number]   SMARTSIG:Spray(s) In Nostril [provider]  Active   nitroGLYCERIN (NITRODUR - DOSED IN MG/24 HR) 0.1 mg/hr patch [id number]  0.1 mg daily. [provider]  Consider Medication Status and Discontinue (Patient Preference)   ondansetron  (ZOFRAN -ODT) 4 MG disintegrating tablet [id number] Yes Take 1 tablet (4 mg total) by mouth every 6 (six) hours as needed for nausea or vomiting. Shirlean Therisa ORN, NP  Active   RELION PEN NEEDLES 31G X 6 MM MISC [id number]  USE 1 PEN NEEDLE ONCE DAILY IN THE AFTERNOON [provider]  Active   rizatriptan (MAXALT) 10 MG tablet [id number] Yes Take 10 mg by mouth daily as needed for migraine. [provider]  Active Self  rosuvastatin  (CRESTOR ) 40 MG tablet [id number] Yes Take 1 tablet (40 mg total) by mouth daily. [name], Ibtehal Jaralla, MD  Active   spironolactone  (ALDACTONE ) 50 MG tablet [id number] Yes  Take 1 tablet (50 mg total) by mouth daily. [name], MD  Active   Tiotropium Bromide Monohydrate  (SPIRIVA  RESPIMAT) 2.5 MCG/ACT AERS [id number] Yes Inhale 2 puffs into the lungs daily. [name], MD  Active   traMADol  (ULTRAM ) 50 MG tablet [id number] Yes Take 50 mg by mouth 2 (two) times daily as needed. [provider]  Active            Assessment/Plan:   Medication Management: - Currently strategy sufficient to maintain appropriate adherence to prescribed medication regimen - recommend use of weekly pill box to organize medications - Discussed collaboration with local pharmacies for adherence packaging. Reviewed local pharmacies with adherence packaging options. Patient elects to continue current strategy  - reviewed all medications - appeared to be taking rosuvastatin  and atorvastatin  at the same time - separated atorvastatin  to discard and will now just take rosuvastatin  40mg .  - contacted pharmacy to have dexcom g7 processed - contacted [facility], [place] required - they plan to send to endocrinologists  office  Follow Up Plan: will request VBCI referral through [facility] for financial assistance related to housing support and help navigating obtaining the process of obtaining disability.   Lang Sieve, PharmD, BCGP Clinical Pharmacist  [phone]

## 2024-06-18 ENCOUNTER — Ambulatory Visit: Admitting: Podiatry

## 2024-06-20 ENCOUNTER — Telehealth: Payer: Self-pay

## 2024-06-20 DIAGNOSIS — Z7189 Other specified counseling: Secondary | ICD-10-CM

## 2024-06-20 NOTE — Progress Notes (Signed)
 Complex Care Management Note  Care Guide Note 06/20/2024 Name: MAREN WIESEN MRN: [id number] DOB: 08/21/1966  JEFFIFER RABOLD is a 58 y.o. year old female who sees [name], MD for primary care. I reached out to [employer] by phone today to offer complex care management services.  Ms. Apps was given information about Complex Care Management services today including:   The Complex Care Management services include support from the care team which includes your Nurse Care Manager, Clinical Social Worker, or Pharmacist.  The Complex Care Management team is here to help remove barriers to the health concerns and goals most important to you. Complex Care Management services are voluntary, and the patient may decline or stop services at any time by request to their care team member.   Complex Care Management Consent Status: Patient agreed to services and verbal consent obtained.   Follow up plan:  Telephone appointment with complex care management team member scheduled for:  06/26/2024  Encounter Outcome:  Patient Scheduled  Jeoffrey Buffalo , RMA     [place]  [facility], [facility] Guide  Direct Dial : [phone]  Website: [email or web address]

## 2024-06-26 ENCOUNTER — Other Ambulatory Visit: Payer: Self-pay

## 2024-06-26 ENCOUNTER — Encounter: Payer: Self-pay | Admitting: Gastroenterology

## 2024-06-26 NOTE — Patient Instructions (Signed)
 Visit Information  Sonya James was given information about Medicaid Managed Care team care coordination services as a part of their [facility] Community Plan Medicaid benefit.   If you would like to schedule transportation through your [facility], please call the following number at least 2 days in advance of your appointment: [phone]   Rides for urgent appointments can also be made after hours by calling Member Services.  Call the Behavioral Health Crisis Line at [phone], at any time, 24 hours a day, 7 days a week. If you are in danger or need immediate medical attention call 911.  Please see education materials related to hypertension and Diabetes II  provided as print materials.   The patient verbalized understanding of instructions, educational materials, and care plan provided today and agreed to receive a mailed copy of patient instructions, educational materials, and care plan.   Telephone follow up appointment with Managed Medicaid care management team member scheduled for: 07-16-2024 at 2:30 with [name], RN   [place]endricks Her RN, BSN  [place] VBCI-Population Health RN Case Manager   Direct Dial [phone]   Following is a copy of your plan of care:  There are no care plans that you recently modified to display for this patient.

## 2024-07-01 ENCOUNTER — Encounter: Payer: Self-pay | Admitting: Licensed Clinical Social Worker

## 2024-07-01 ENCOUNTER — Telehealth: Payer: Self-pay | Admitting: Licensed Clinical Social Worker

## 2024-07-01 NOTE — Patient Instructions (Addendum)
 Sonya James - I am sorry I was unable to reach you today for our scheduled appointment. I work with Tobie, Suzzane POUR, MD and am calling to support your healthcare needs. Please contact me at [phone] at your earliest convenience. I look forward to speaking with you soon on 07/17/24 at 3 pm  Thank you,  Lyle Rung, BSW, MSW, LCSW Licensed Clinical Social Worker [employer]   [email or web address] Direct Dial : [phone]

## 2024-07-04 ENCOUNTER — Ambulatory Visit: Admitting: Internal Medicine

## 2024-07-08 ENCOUNTER — Ambulatory Visit: Admitting: Orthopedic Surgery

## 2024-07-09 ENCOUNTER — Encounter: Payer: Self-pay | Admitting: Podiatry

## 2024-07-09 ENCOUNTER — Ambulatory Visit (INDEPENDENT_AMBULATORY_CARE_PROVIDER_SITE_OTHER): Admitting: Podiatry

## 2024-07-09 DIAGNOSIS — M7752 Other enthesopathy of left foot: Secondary | ICD-10-CM | POA: Diagnosis not present

## 2024-07-09 DIAGNOSIS — D492 Neoplasm of unspecified behavior of bone, soft tissue, and skin: Secondary | ICD-10-CM | POA: Diagnosis not present

## 2024-07-09 MED ORDER — TRIAMCINOLONE ACETONIDE 40 MG/ML IJ SUSP
40.0000 mg | Freq: Once | INTRAMUSCULAR | Status: AC
  Administered 2024-07-09: 40 mg

## 2024-07-09 NOTE — Progress Notes (Signed)
 Patient presents today with complaint of a new lesion on the plantar aspect of the left right foot that just developed.  It suddenly started getting painfu  Tender Gaulding injury to the area.  Complaining of pain under the second MTP on the left foot.   Physical exam:  General appearance: Pleasant, and in no acute distress. AOx3.  Vascular: Pedal pulses: DP 2/4 bilaterally, PT1/4 bilaterally.  Mild edema lower legs bilaterally. Capillary fill time immediate bilateral.  Neurological: Light touch intact feet bilaterally.  Normal Achilles reflex bilaterally.  No clonus or spasticity noted.  Negative Mulder sign bilaterally  Dermatologic:   Verrucous lesion plantar aspect right forefoot with skin lines going around the lesion and some pinpoint hemorrhages upon debridement.  Tenderness with lateral pressure on the lesion.  Skin normal temperature bilaterally.  Skin normal color, tone, and texture bilaterally.   Musculoskeletal: Pain plantar aspect second MTP left foot.  No tenderness with range of motion of the second MTP.   Diagnosis: 1.  Capsulitis second MTP left foot. 2.  Verrucous neoplasm plantar aspect right foot.  Plan: -Discussed Radparvar wart on the right foot.  Given the sudden appearance and clinical look it looks like a wart.  Will treated as such.  She has the pain in the left foot with capsulitis we will try an injection today that should calm this down.  -injected 3cc 2:1 mixture 0.5 cc Marcaine :Kenolog 10mg /16ml at second MTP joint capsule left  -Applied Salinocaine compound to lesion(s) as noted in physical exam after debriding lesions to pinpoint bleeding.  Salinocaine applied to lesion(s) and covered with an occlusive dressing with Coban wrap.  Written and oral instructions given to patient.  Right foot  .    Return 2 weeks follow-up verruca right and injection left

## 2024-07-10 ENCOUNTER — Encounter: Payer: Self-pay | Admitting: Internal Medicine

## 2024-07-11 ENCOUNTER — Telehealth

## 2024-07-11 ENCOUNTER — Ambulatory Visit (INDEPENDENT_AMBULATORY_CARE_PROVIDER_SITE_OTHER): Admitting: Internal Medicine

## 2024-07-11 VITALS — BP 130/70 | HR 61 | Ht 65.0 in | Wt 214.6 lb

## 2024-07-11 DIAGNOSIS — E785 Hyperlipidemia, unspecified: Secondary | ICD-10-CM

## 2024-07-11 DIAGNOSIS — E1169 Type 2 diabetes mellitus with other specified complication: Secondary | ICD-10-CM

## 2024-07-11 DIAGNOSIS — E1165 Type 2 diabetes mellitus with hyperglycemia: Secondary | ICD-10-CM

## 2024-07-11 DIAGNOSIS — E1142 Type 2 diabetes mellitus with diabetic polyneuropathy: Secondary | ICD-10-CM

## 2024-07-11 DIAGNOSIS — Z794 Long term (current) use of insulin: Secondary | ICD-10-CM

## 2024-07-11 LAB — GLUCOSE, POCT (MANUAL RESULT ENTRY): POC Glucose: 368 mg/dL — AB (ref 70–99)

## 2024-07-11 MED ORDER — METFORMIN HCL ER 500 MG PO TB24: 500.0000 mg | ORAL_TABLET | Freq: Two times a day (BID) | ORAL | 3 refills | Status: AC

## 2024-07-11 MED ORDER — EMPAGLIFLOZIN 25 MG PO TABS: 25.0000 mg | ORAL_TABLET | Freq: Every day | ORAL | 3 refills | Status: AC

## 2024-07-11 MED ORDER — LANTUS SOLOSTAR 100 UNIT/ML ~~LOC~~ SOPN: 48.0000 [IU] | PEN_INJECTOR | Freq: Every day | SUBCUTANEOUS | 6 refills | Status: AC

## 2024-07-11 MED ORDER — INSULIN LISPRO (1 UNIT DIAL) 100 UNIT/ML (KWIKPEN)
10.0000 [IU] | PEN_INJECTOR | Freq: Three times a day (TID) | SUBCUTANEOUS | 3 refills | Status: AC
Start: 2024-07-11 — End: ?

## 2024-07-11 MED ORDER — ROSUVASTATIN CALCIUM 40 MG PO TABS: 40.0000 mg | ORAL_TABLET | Freq: Every day | ORAL | 3 refills | Status: AC

## 2024-07-11 NOTE — Patient Instructions (Addendum)
 Take Lantus  48 units daily  Stop  Metformin  1000 mg Twice day  Start Metformin  500 mg, 1 tablet twice daily  Continue Jardiance  25 mg daily  Take Humalog  10 units with every meal     HOW TO TREAT LOW BLOOD SUGARS (Blood sugar LESS THAN 70 MG/DL) Please follow the RULE OF 15 for the treatment of hypoglycemia treatment (when your (blood sugars are less than 70 mg/dL)   STEP 1: Take 15 grams of carbohydrates when your blood sugar is low, which includes:  3-4 GLUCOSE TABS  OR 3-4 OZ OF JUICE OR REGULAR SODA OR ONE TUBE OF GLUCOSE GEL    STEP 2: RECHECK blood sugar in 15 MINUTES STEP 3: If your blood sugar is still low at the 15 minute recheck --> then, go back to STEP 1 and treat AGAIN with another 15 grams of carbohydrates.

## 2024-07-11 NOTE — Progress Notes (Signed)
 Name: Sonya James  MRN/ DOB: [id number], Aug 05, 1966   Age/ Sex: 58 y.o., female    PCP: [name], MD   Reason for Endocrinology Evaluation: Type 2 Diabetes Mellitus     Date of Initial Endocrinology Visit: 09/02/2022    PATIENT IDENTIFIER: [name] is a 58 y.o. female with a past medical history of T2DM, HTN, Hx of endometrial cancer and Hx of Pancreatitis and dyslipidemia . The patient presented for initial endocrinology clinic visit on 09/02/2021  for consultative assistance with her diabetes management.    HPI: [name] was    Diagnosed with DM 2014 Prior Medications tried/Intolerance: Has been on jardiance  without intolerance issues           Hemoglobin A1c has ranged from 5.9% in 2017, peaking at 12.2% in 2022.  She has dysphagia and thyromegaly . She had an ultrasound in 2005 that did not show any nodules at the time . TFT's normal    On her initial visit to our clinic she had an A1c 9.0 % , She was on Glipizide , Novolin  Mix , and Metformin  . We stopped Glipizide , Novolin  Mix, started basal insulin , Jardiance  and continued Metformin     Started prandial insulin  in March, 2025 with an A1c of 11.1%  SUBJECTIVE:   During the last visit (11/26/2023): A1c 11.1 %     Today (07/11/24): Ms.Stash is here for a follow up on diabetes management. She checks her  blood sugars 0 times daily. The patient has not had hypoglycemic episodes since the last clinic visit.   Patient had a follow-up with hematology for erythrocytosis, unprovoked pulmonary embolism as well as history of endometrial cancer    Has nausea and vomiting  Has GERD  Continues with  chronic constipation, uses Linzess    HOME DIABETES REGIMEN: Metformin   1000 mg BID  Jardiance  25 mg daily  Lantus  48 units daily Humalog  8 units with each meal Rosuvastatin  40 mg daily     Statin: yes ACE-I/ARB: yes Prior Diabetic Education: yes - does not like going there    METER DOWNLOAD SUMMARY:  n/a   DIABETIC COMPLICATIONS: Microvascular complications:  Neuropathy Denies: CKD, retinopathy  Last eye exam: Completed 2023  Macrovascular complications:   Denies: CAD, CVA      PAST HISTORY: Past Medical History:  Past Medical History:  Diagnosis Date   Anemia    Anxiety    Arthritis    Asthma 05/25/2013   Autonomic neuropathy    Cancer of endometrium (HCC) 10/18/2015   Cellulitis, face 05/03/2021   Constipation    COPD (chronic obstructive pulmonary disease) (HCC)    CTS (carpal tunnel syndrome)    Depression    Diabetes mellitus without complication (HCC)    Dysphagia 12/14/2016   Encounter for screening colonoscopy    Endometrial cancer (HCC) 2017   Gastritis without bleeding    GERD (gastroesophageal reflux disease)    Gross hematuria 05/11/2019   Headache(784.0)    Heavy menses 10/11/2015   HTN (hypertension)    Hypercholesterolemia    Hypothyroidism    IBS (irritable bowel syndrome)    Osteoarthritis    osteoarthritis   Pancreatitis    per patient    Recurrent boils    buttocks and low back.   S/P laparoscopic hysterectomy 11/04/2015   Shortness of breath    Sleep apnea    CPAP machine   Uncontrolled type 2 diabetes mellitus with diabetic polyneuropathy, with long-term current use of insulin  08/04/2017  Uterine cancer (HCC) 2017   Vitamin D  deficiency    Past Surgical History:  Past Surgical History:  Procedure Laterality Date   ABDOMINAL HYSTERECTOMY  2018   endometrial cancer, surgery done at [facility]    BIOPSY  01/02/2017   Procedure: BIOPSY;  Surgeon: Margo LITTIE Haddock, MD;  Location: [facility];  Service: Endoscopy;;  gastric biopsy   CESAREAN SECTION  1989   COLONOSCOPY WITH PROPOFOL  N/A 01/02/2017   Dr. Haddock: redundant colon, two 2-3 mm polyps in sigmoid colon (hyperplastic)   EAR CYST EXCISION Right 11/29/2021   Procedure: EXCISION OF EXTERNAL EAR LESION;  Surgeon: Llewellyn Gerard LABOR, DO;  Location: [facility] OR;  Service: ENT;   Laterality: Right;   ESOPHAGOGASTRODUODENOSCOPY  05/22/2011   Dr. Haddock: H.pylori gastritis    ESOPHAGOGASTRODUODENOSCOPY (EGD) WITH PROPOFOL  N/A 01/02/2017   Dr. Haddock: possible web in proximal esophagus s/p dilation, moderate gastritis (negative H.pylori)   FOOT SURGERY Left    tendon repair   IR US  GUIDE VASC ACCESS LEFT  01/18/2021   OOPHORECTOMY     OTOPLASATY Left 06/15/2021   Procedure: Excision of left auricular lesion with endaural meatoplasty;  Surgeon: Llewellyn Gerard LABOR, DO;  Location: [facility] OR;  Service: ENT;  Laterality: Left;   POLYPECTOMY  01/02/2017   Procedure: POLYPECTOMY;  Surgeon: Margo LITTIE Haddock, MD;  Location: [facility];  Service: Endoscopy;;  sigmoid colon polyps times 2   REDUCTION MAMMAPLASTY  1996   SAVORY DILATION N/A 01/02/2017   Procedure: SAVORY DILATION;  Surgeon: Margo LITTIE Haddock, MD;  Location: [facility];  Service: Endoscopy;  Laterality: N/A;   TUBAL LIGATION      Social History:  reports that she has been smoking cigarettes. She has a 33 pack-year smoking history. She has never used smokeless tobacco. She reports that she does not drink alcohol and does not use drugs. Family History:  Family History  Problem Relation Age of Onset   Diabetes Mother    Hypertension Mother    COPD Mother    Asthma Mother    Hypercholesterolemia Mother    Hypertension Sister    Hyperlipidemia Sister    Diabetes Sister    Asthma Brother    Alcohol abuse Brother    Asthma Son    Cancer Maternal Grandmother        lung, throat, breast   Alzheimer's disease Maternal Grandmother    Hypertension Maternal Grandmother    Hypercholesterolemia Maternal Grandmother    Heart disease Maternal Grandfather    Stroke Maternal Grandfather    Clotting disorder Maternal Grandfather        blood clots in legs   Hypertension Sister    Hyperlipidemia Sister    Stroke Maternal Aunt    Clotting disorder Maternal Aunt        blood clots in legs   Hypertension Maternal Aunt     Hypercholesterolemia Maternal Aunt    Hypertension Maternal Aunt    Asthma Maternal Aunt    Clotting disorder Maternal Uncle        blood clot -legs traveled to heart and he died of heart attack   Colon cancer Neg Hx      HOME MEDICATIONS: Allergies as of 07/11/2024       Reactions   Ibuprofen     dizziness   Lisinopril  Swelling   Swelling in face        Medication List        Accurate as of July 11, 2024  8:48 AM. If you have any questions, ask your nurse or doctor.          Accu-Chek Guide Me w/Device Kit 1 Piece by Does not apply route as directed.   Blood Glucose Monitoring Suppl Devi 1 each by Does not apply route in the morning, at noon, and at bedtime. May substitute to any manufacturer covered by patient's insurance.   Accu-Chek Guide test strip Generic drug: glucose blood Test glucose 4 times a day   Accu-Chek Softclix Lancets lancets To test glucose 4 times a day   albuterol  108 (90 Base) MCG/ACT inhaler Commonly known as: VENTOLIN  HFA Inhale 2 puffs into the lungs every 6 (six) hours as needed for wheezing or shortness of breath.   albuterol  (2.5 MG/3ML) 0.083% nebulizer solution Commonly known as: PROVENTIL  Take 3 mLs (2.5 mg total) by nebulization every 6 (six) hours as needed for wheezing or shortness of breath.   amLODipine  10 MG tablet Commonly known as: NORVASC  Take 1 tablet (10 mg total) by mouth daily.   apixaban  5 MG Tabs tablet Commonly known as: ELIQUIS  Take 1 tablet (5 mg total) by mouth 2 (two) times daily. On 08/12/17 @ 9PM, start 1 tab (5 mg) two times daily.   clotrimazole  2 % vaginal cream Commonly known as: GYNE-LOTRIMIN  3 Place 1 Applicatorful vaginally at bedtime. What changed:  when to take this reasons to take this   Dexcom G7 Sensor Misc 1 Device by Does not apply route as directed.   DULoxetine  60 MG capsule Commonly known as: CYMBALTA  Take 1 capsule (60 mg total) by mouth daily.   empagliflozin  25 MG Tabs  tablet Commonly known as: Jardiance  Take 1 tablet (25 mg total) by mouth daily before breakfast.   esomeprazole  40 MG capsule Commonly known as: NEXIUM  TAKE 1 CAPSULE BY MOUTH TWICE DAILY BEFORE MEAL(S)   furosemide  40 MG tablet Commonly known as: LASIX  Take 1 tablet by mouth once daily   insulin  lispro 100 UNIT/ML KwikPen Commonly known as: HumaLOG  KwikPen Inject 8 Units into the skin 3 (three) times daily.   labetalol  100 MG tablet Commonly known as: NORMODYNE  Take 0.5 tablets (50 mg total) by mouth 2 (two) times daily.   Lantus  SoloStar 100 UNIT/ML Solostar Pen Generic drug: insulin  glargine Inject 46 Units into the skin daily.   linaclotide  290 MCG Caps capsule Commonly known as: Linzess  TAKE 1 CAPSULE BY MOUTH ONCE DAILY BEFORE BREAKFAST   metFORMIN  1000 MG tablet Commonly known as: GLUCOPHAGE  Take 1 tablet (1,000 mg total) by mouth 2 (two) times daily with a meal.   Misc. Devices Misc Blood pressure monitoring device   naloxone 4 MG/0.1ML Liqd nasal spray kit Commonly known as: NARCAN SMARTSIG:Spray(s) In Nostril   nitroGLYCERIN 0.1 mg/hr patch Commonly known as: NITRODUR - Dosed in mg/24 hr 0.1 mg daily.   ondansetron  4 MG disintegrating tablet Commonly known as: ZOFRAN -ODT Take 1 tablet (4 mg total) by mouth every 6 (six) hours as needed for nausea or vomiting.   ReliOn Pen Needles 31G X 6 MM Misc Generic drug: Insulin  Pen Needle USE 1 PEN NEEDLE ONCE DAILY IN THE AFTERNOON   Insulin  Pen Needle 31G X 5 MM Misc 1 Device by Does not apply route in the morning, at noon, in the evening, and at bedtime.   rizatriptan 10 MG tablet Commonly known as: MAXALT Take 10 mg by mouth daily as needed for migraine.   rosuvastatin  40 MG tablet Commonly known as: CRESTOR  Take 1 tablet (40  mg total) by mouth daily.   Spiriva  Respimat 2.5 MCG/ACT Aers Generic drug: Tiotropium Bromide Inhale 2 puffs into the lungs daily.   spironolactone  50 MG tablet Commonly  known as: ALDACTONE  Take 1 tablet (50 mg total) by mouth daily.   traMADol  50 MG tablet Commonly known as: ULTRAM  Take 50 mg by mouth 2 (two) times daily as needed.         ALLERGIES: Allergies  Allergen Reactions   Ibuprofen      dizziness   Lisinopril  Swelling    Swelling in face        OBJECTIVE:   VITAL SIGNS: BP 130/70   Pulse 61   Ht 5' 5 (1.651 m)   Wt 214 lb 9.6 oz (97.3 kg)   LMP 09/20/2015 (Exact Date)   SpO2 97%   BMI 35.71 kg/m    PHYSICAL EXAM:  General: Pt appears well and is in NAD  Lungs: Clear with good BS bilat   Heart: RRR   Extremities:  Lower extremities - No pretibial edema. No lesions.  Neuro: MS is good with appropriate affect, pt is alert and Ox3   DM Foot Exam 07/09/2024 per podiatry     DATA REVIEWED:  Lab Results  Component Value Date   HGBA1C >15.5 (H) 05/30/2024   HGBA1C 11.1 (A) 11/26/2023   HGBA1C 8.6 (H) 08/13/2023    Latest Reference Range & Units 05/30/24 09:42  Sodium 134 - 144 mmol/L 136  Potassium 3.5 - 5.2 mmol/L 4.8  Chloride 96 - 106 mmol/L 101  CO2 20 - 29 mmol/L 26  Glucose 70 - 99 mg/dL 616 (H)  BUN 6 - 24 mg/dL 6  Creatinine 9.42 - 8.99 mg/dL 9.27  Calcium  8.7 - 10.2 mg/dL 9.5  BUN/Creatinine Ratio 9 - 23  8 (L)  eGFR >59 mL/min/1.73 97  Alkaline Phosphatase 49 - 135 IU/L 112  Albumin 3.8 - 4.9 g/dL 3.8  AST 0 - 40 IU/L 7  ALT 0 - 32 IU/L 10  Total Protein 6.0 - 8.5 g/dL 6.3  Total Bilirubin 0.0 - 1.2 mg/dL 0.4    Latest Reference Range & Units 05/30/24 09:42  Total CHOL/HDL Ratio 0.0 - 4.4 ratio 5.7 (H)  Cholesterol, Total 100 - 199 mg/dL 741 (H)  HDL Cholesterol >39 mg/dL 45  Triglycerides 0 - 850 mg/dL 93  VLDL Cholesterol Cal 5 - 40 mg/dL 16  LDL Chol Calc (NIH) 0 - 99 mg/dL 802 (H)  LDL CALC COMMENT:  Comment  (H): Data is abnormally high  Thyroid  Ultrasound 09/19/2021  Estimated total number of nodules >/= 1 cm: 0   Number of spongiform nodules >/=  2 cm not described below (TR1):  0   Number of mixed cystic and solid nodules >/= 1.5 cm not described below (TR2): 0   _________________________________________________________   Few scattered less than 5 mm nodules do not meet criteria for imaging surveillance.   IMPRESSION: No significant sonographic abnormality of the thyroid .     Old records , labs and images have been reviewed.  In office BG 368 MGs/DL   ASSESSMENT / PLAN / RECOMMENDATIONS:   1) Type 2 Diabetes Mellitus, Poorly controlled, With neuropathic  complications - Most recent A1c of 15.1%. Goal A1c < 7.0 %.    -A1c increased from 11.1% to >15.5 % which is a clear indication that she is not taking her glycemic agents at all, her A1c should have improved somewhat with adding prandial insulin  but in fact got worse. -She  is NOT a candidate for GLP-1 agonist and DPP-4 inhibitors due to hx of pancreatitis  - Patient was social determinants, having difficult living situation - She does admit to forgetting medications at times - She took Humalog  yesterday after dinner, she tells me she took 48 units just like the Lantus ? - I did emphasize the importance of taking Humalog  before the meal at 10 units - No changes to Lantus  dose - She does endorse heartburn, nausea, and vomiting, I will switch metformin  from 1000 mg to 500 XR dosing - CGM technology is cost by habitus, hence an insulin  pump would not be ideal and will be cost prohibitive  MEDICATIONS: Stop metformin  1000 mg Twice d ay  Start metformin  500 mg XR twice daily Continue Jardiance  25 mg daily  Continue Lantus  48 units daily Take Humalog  10 units before each meal 3 times daily  EDUCATION / INSTRUCTIONS: BG monitoring instructions: Patient is instructed to check her blood sugars 3 times a day, fasting . Call [facility] if: BG persistently < 70  I reviewed the Rule of 15 for the treatment of hypoglycemia in detail with the patient. Literature supplied.   2) Diabetic  complications:  Eye: Does not have known diabetic retinopathy.  Neuro/ Feet: Does have known diabetic peripheral neuropathy. Renal: Patient does not have known baseline CKD. She is not on an ACEI/ARB at present.   3) Dyslipidemia :  -I had switched atorvastatin  80 mg to rosuvastatin  40 mg, with an LDL of 147 MGs/DL in March, 7974 - LDL is worse from September, 2025 and is a clear indication that the patient does not take her medication - I have encouraged the patient to use a pillbox  Medication  rosuvastatin  40 mg daily   Follow-up in 3 months   Signed electronically by: Stefano Redgie Butts, MD  [facility]  [facility] [address] [place] [place], [place] [address] Phone: [phone] FAX: [phone]   CC: [name], MD [address] [place] [address] Phone: [phone]  Fax: [phone]    Return to Endocrinology clinic as below: Future Appointments  Date Time Provider Department Center  07/11/2024  9:50 AM Clearnce Leja, Donell Redgie, MD [facility] None  07/16/2024  2:30 PM [name], RN [facility] None  07/17/2024 11:00 AM [facility] [address]  07/17/2024  3:00 PM [name], LCSW [facility] None  07/25/2024  9:45 AM [name], DPM [facility]  01/14/2025  9:30 AM [facility] None  01/14/2025 10:30 AM [name] HERO, PA-C [facility] None

## 2024-07-16 ENCOUNTER — Other Ambulatory Visit: Payer: Self-pay | Admitting: *Deleted

## 2024-07-16 NOTE — Patient Outreach (Signed)
 Complex Care Management   Visit Note  07/16/2024  Name:  Sonya James MRN: [id number] DOB: 03/02/1966  Situation: Referral received for Complex Care Management related to Diabetes with Complications and HTN I obtained verbal consent from Patient.  Visit completed with Patient  on the phone  Background:   Past Medical History:  Diagnosis Date   Anemia    Anxiety    Arthritis    Asthma 05/25/2013   Autonomic neuropathy    Cancer of endometrium (HCC) 10/18/2015   Cellulitis, face 05/03/2021   Constipation    COPD (chronic obstructive pulmonary disease) (HCC)    CTS (carpal tunnel syndrome)    Depression    Diabetes mellitus without complication (HCC)    Dysphagia 12/14/2016   Encounter for screening colonoscopy    Endometrial cancer (HCC) 2017   Gastritis without bleeding    GERD (gastroesophageal reflux disease)    Gross hematuria 05/11/2019   Headache(784.0)    Heavy menses 10/11/2015   HTN (hypertension)    Hypercholesterolemia    Hypothyroidism    IBS (irritable bowel syndrome)    Osteoarthritis    osteoarthritis   Pancreatitis    per patient    Recurrent boils    buttocks and low back.   S/P laparoscopic hysterectomy 11/04/2015   Shortness of breath    Sleep apnea    CPAP machine   Uncontrolled type 2 diabetes mellitus with diabetic polyneuropathy, with long-term current use of insulin  08/04/2017   Uterine cancer (HCC) 2017   Vitamin D  deficiency     Assessment: Patient Reported Symptoms:  Cognitive Cognitive Status: No symptoms reported   Health Maintenance Behaviors: Annual physical exam Healing Pattern: Average Health Facilitated by: Rest  Neurological Neurological Review of Symptoms: Headaches Neurological Management Strategies: Routine screening  HEENT HEENT Symptoms Reported: No symptoms reported      Cardiovascular Cardiovascular Symptoms Reported: No symptoms reported Does patient have uncontrolled Hypertension?: Yes Is patient checking Blood  Pressure at home?: Yes    Respiratory Respiratory Symptoms Reported: Shortness of breath Respiratory Management Strategies: Coping strategies  Endocrine Endocrine Symptoms Reported: Headaches, Increased urination Is patient diabetic?: Yes Is patient checking blood sugars at home?: Yes List most recent blood sugar readings, include date and time of day: Greater than 300 Endocrine Self-Management Outcome: 2 (bad)  Gastrointestinal Gastrointestinal Symptoms Reported: Other Other Gastrointestinal Symptoms: IBS Gastrointestinal Management Strategies: Medication therapy Gastrointestinal Self-Management Outcome: 3 (uncertain)    Genitourinary Genitourinary Symptoms Reported: No symptoms reported    Integumentary Integumentary Symptoms Reported: Wound Additional Integumentary Details: Bilateral debridement of corns to feet Skin Management Strategies: Coping strategies Skin Self-Management Outcome: 3 (uncertain)  Musculoskeletal Musculoskelatal Symptoms Reviewed: Unsteady gait Musculoskeletal Management Strategies: Routine screening Falls in the past year?: No Number of falls in past year: 1 or less Was there an injury with Fall?: No Fall Risk Category Calculator: 0 Patient Fall Risk Level: Low Fall Risk Patient at Risk for Falls Due to: Impaired mobility, History of fall(s) Fall risk Follow up: Falls evaluation completed  Psychosocial Psychosocial Symptoms Reported: Anxiety - if selected complete GAD, Depression - if selected complete PHQ 2-9, Sadness - if selected complete PHQ 2-9 Behavioral Management Strategies: Coping strategies Behavioral Health Self-Management Outcome: 3 (uncertain) Major Change/Loss/Stressor/Fears (CP): Medical condition, self Techniques to Cope with Loss/Stress/Change: Diversional activities, Counseling      07/16/2024    PHQ2-9 Depression Screening   Little interest or pleasure in doing things Nearly every day  Feeling down, depressed, or hopeless Several days  PHQ-2 - Total Score 4  Trouble falling or staying asleep, or sleeping too much Not at all  Feeling tired or having little energy Nearly every day  Poor appetite or overeating  Not at all  Feeling bad about yourself - or that you are a failure or have let yourself or your family down Nearly every day  Trouble concentrating on things, such as reading the newspaper or watching television Several days  Moving or speaking so slowly that other people could have noticed.  Or the opposite - being so fidgety or restless that you have been moving around a lot more than usual Several days  Thoughts that you would be better off dead, or hurting yourself in some way Not at all  PHQ2-9 Total Score 12  If you checked off any problems, how difficult have these problems made it for you to do your work, take care of things at home, or get along with other people Somewhat difficult  Depression Interventions/Treatment      Vitals:   07/16/24 1455 07/16/24 1506  BP: (!) 191/110 (!) 169/102    Medications Reviewed Today     Reviewed by [name], RN (Registered Nurse) on 07/16/24 at 1453  Med List Status: <None>   Medication Order Taking? Sig Documenting Provider Last Dose Status Informant  Accu-Chek Softclix Lancets lancets [id number]  To test glucose 4 times a day Nida, Gebreselassie W, MD  Active Self  albuterol  (PROVENTIL ) (2.5 MG/3ML) 0.083% nebulizer solution [id number]  Take 3 mLs (2.5 mg total) by nebulization every 6 (six) hours as needed for wheezing or shortness of breath. [name], MD  Active   albuterol  (VENTOLIN  HFA) [phone] Base) MCG/ACT inhaler [id number]  Inhale 2 puffs into the lungs every 6 (six) hours as needed for wheezing or shortness of breath. [name], MD  Active   amLODipine  (NORVASC ) 10 MG tablet [id number]  Take 1 tablet (10 mg total) by mouth daily. [name], MD  Active   apixaban  (ELIQUIS ) 5 MG TABS tablet [id number]  Take 1 tablet (5 mg total) by mouth 2  (two) times daily. On 08/12/17 @ 9PM, start 1 tab (5 mg) two times daily. [name], MD  Active   Blood Glucose Monitoring Suppl (ACCU-CHEK GUIDE ME) w/Device KIT [id number]  1 Piece by Does not apply route as directed. Nida, Gebreselassie W, MD  Active Self  Blood Glucose Monitoring Suppl DEVI [id number]  1 each by Does not apply route in the morning, at noon, and at bedtime. May substitute to any manufacturer covered by patient's insurance. [name], MD  Active   clotrimazole  (GYNE-LOTRIMIN  3) 2 % vaginal cream [id number] Yes Place 1 Applicatorful vaginally at bedtime.  Patient taking differently: Place 1 Applicatorful vaginally at bedtime as needed.   Antonetta Rollene BRAVO, MD  Active Self           Med Note GARNET, Khristi Schiller M   Fri May 09, 2024 11:41 AM)    Continuous Glucose Sensor (DEXCOM G7 SENSOR) [place] [id number] Yes 1 Device by Does not apply route as directed. [name], Ibtehal Jaralla, MD  Active   DULoxetine  (CYMBALTA ) 60 MG capsule [id number]  Take 1 capsule (60 mg total) by mouth daily. [name], MD  Active   empagliflozin  (JARDIANCE ) 25 MG TABS tablet [id number]  Take 1 tablet (25 mg total) by mouth daily before breakfast. [name], Ibtehal Jaralla, MD  Active   esomeprazole  (NEXIUM ) 40 MG capsule [id number]  TAKE 1 CAPSULE BY MOUTH TWICE DAILY BEFORE MEAL(S) Shirlean Therisa ORN, NP  Active   furosemide  (LASIX ) 40 MG tablet [id number]  Take 1 tablet by mouth once daily Patel, Rutwik K, MD  Active   glucose blood (ACCU-CHEK GUIDE) test strip [id number]  Test glucose 4 times a day Nida, Gebreselassie W, MD  Active Self  insulin  glargine (LANTUS  SOLOSTAR) 100 UNIT/ML Solostar Pen [id number] Yes Inject 48 Units into the skin daily.  Patient taking differently: Inject 48 Units into the skin 2 (two) times daily.   [name], Ibtehal Jaralla, MD  Active   insulin  lispro (HUMALOG  KWIKPEN) 100 UNIT/ML KwikPen [id number] Yes Inject 10 Units into the skin 3 (three) times daily. [name],  [name], MD  Active   Insulin  Pen Needle 31G X 5 MM MISC [id number] Yes 1 Device by Does not apply route in the morning, at noon, in the evening, and at bedtime. [name], Ibtehal Jaralla, MD  Active   labetalol  (NORMODYNE ) 100 MG tablet [id number]  Take 0.5 tablets (50 mg total) by mouth 2 (two) times daily. [name], MD  Active   linaclotide  (LINZESS ) 290 MCG CAPS capsule [id number]  TAKE 1 CAPSULE BY MOUTH ONCE DAILY BEFORE BREAKFAST Shirlean Therisa ORN, NP  Active   metFORMIN  (GLUCOPHAGE -XR) 500 MG 24 hr tablet [id number] Yes Take 1 tablet (500 mg total) by mouth 2 (two) times daily with a meal. [name], [name], MD  Active   Misc. Devices MISC [id number]  Blood pressure monitoring device [name], MD  Active Self  naloxone [facility]) nasal spray 4 mg/0.1 mL [id number]  SMARTSIG:Spray(s) In Nostril [provider]  Active   nitroGLYCERIN (NITRODUR - DOSED IN MG/24 HR) 0.1 mg/hr patch [id number]  0.1 mg daily. [provider]  Active   ondansetron  (ZOFRAN -ODT) 4 MG disintegrating tablet [id number]  Take 1 tablet (4 mg total) by mouth every 6 (six) hours as needed for nausea or vomiting. Shirlean Therisa ORN, NP  Active   RELION PEN NEEDLES 31G X 6 MM MISC [id number]  USE 1 PEN NEEDLE ONCE DAILY IN THE AFTERNOON [provider]  Active   rizatriptan (MAXALT) 10 MG tablet [id number]  Take 10 mg by mouth daily as needed for migraine. [provider]  Active Self  rosuvastatin  (CRESTOR ) 40 MG tablet [id number]  Take 1 tablet (40 mg total) by mouth daily. [name], Ibtehal Jaralla, MD  Active   spironolactone  (ALDACTONE ) 50 MG tablet [id number]  Take 1 tablet (50 mg total) by mouth daily. [name], MD  Active   Tiotropium Bromide Monohydrate  (SPIRIVA  RESPIMAT) 2.5 MCG/ACT AERS [id number]  Inhale 2 puffs into the lungs daily. [name], MD  Active   traMADol  (ULTRAM ) 50 MG tablet [id number]  Take 50 mg by mouth 2 (two) times daily as needed.  [provider]  Active             Recommendation:   Continue Current Plan of Care Follow recommended diet, check blood sugar and blood pressure daily and record  Follow Up Plan:   Telephone follow-up in 2 weeks  [name], BSN RN [facility] RN Care Manager Direct Dial : [phone]  Fax: [phone]

## 2024-07-16 NOTE — Patient Instructions (Signed)
 Visit Information  Ms. Hottenstein was given information about Medicaid Managed Care team care coordination services as a part of their [facility] Community Plan Medicaid benefit.   If you would like to schedule transportation through your [facility], please call the following number at least 2 days in advance of your appointment: [phone]   Rides for urgent appointments can also be made after hours by calling Member Services.  Call the Behavioral Health Crisis Line at [phone], at any time, 24 hours a day, 7 days a week. If you are in danger or need immediate medical attention call 911.  Please see education materials related to Diabetes, Hypertension provided by MyChart link.  The patient verbalized understanding of instructions, educational materials, and care plan provided today and DECLINED offer to receive copy of patient instructions, educational materials, and care plan.   Telephone follow up appointment with Managed Medicaid care management team member scheduled for:07-30-2024 at 2:30 pm  [name], BSN RN [facility], [facility] RN Care Manager Direct Dial : [phone]  Fax: [phone]   Following is a copy of your plan of care:  There are no care plans that you recently modified to display for this patient.

## 2024-07-17 ENCOUNTER — Other Ambulatory Visit: Payer: Self-pay | Admitting: Licensed Clinical Social Worker

## 2024-07-17 ENCOUNTER — Other Ambulatory Visit (INDEPENDENT_AMBULATORY_CARE_PROVIDER_SITE_OTHER)

## 2024-07-17 DIAGNOSIS — Z794 Long term (current) use of insulin: Secondary | ICD-10-CM

## 2024-07-17 DIAGNOSIS — E1165 Type 2 diabetes mellitus with hyperglycemia: Secondary | ICD-10-CM

## 2024-07-17 NOTE — Progress Notes (Signed)
 07/17/2024 Name: Sonya James MRN: [id number] DOB: 1965-12-04  Chief Complaint  Patient presents with   Medication Management   Diabetes    Sonya James is a 58 y.o. year old female who presented for a telephone visit.   They were referred to the pharmacist by their PCP for assistance in managing medication access and Medication Adherence.   Subjective:  Care Team: Primary Care Provider: [name], MD ; Next Scheduled Visit:  Future Appointments  Date Time Provider Department Center  07/17/2024  3:00 PM [name], LCSW [facility] None  07/25/2024  9:45 AM [name], DPM [facility]  07/30/2024  2:30 PM [name], RN [facility] None  10/16/2024 10:00 AM [facility] [address]  01/08/2025  8:30 AM [name], [name], MD [facility] None  01/14/2025  9:30 AM [facility] None  01/14/2025 10:30 AM [name] HERO, PA-C [facility] None    Medication Access/Adherence  Current Pharmacy:  [facility] [address] [place] - [address] [place] [address] Phone: [phone] Fax: [phone]  [facility] [place], [place] - [address] [place] [address] [place] [address] Phone: [phone] Fax: [phone]  [facility] [address] [place] [address] Phone: [phone] Fax: [phone]   Patient reports affordability concerns with their medications: Yes  Patient reports access/transportation concerns to their pharmacy: Yes, however recently has started transportation benefit again.   Patient reports adherence concerns with their medications:  No    Medication Management:  Current adherence strategy/DM Medication management Current DM Meds:  -Lantus  48 units daily (takes twice daily per pt report and endocrinology note 07/11/2024),  -Humalog  10 units TID before meals,  -jardiance  25 units daily,   -metformin  XR 500mg  BID (GI side effects at higher dose)    - Hx of pancreatitis noted - Dexcom g7 approved, has had on 4 days, lots of alarms first couple days but getting better now that she is being consistent with prescribed meds - BG has been in 300s, recently 100s and was 114 at start of our call today 07/17/24 11am - Meals: skips breakfast d/t feeling n/v when eating first thing in the day, consistent with lunch and dinner.   Objective:  Lab Results  Component Value Date   HGBA1C >15.5 (H) 05/30/2024    Lab Results  Component Value Date   CREATININE 0.72 05/30/2024   BUN 6 05/30/2024   NA 136 05/30/2024   K 4.8 05/30/2024   CL 101 05/30/2024   CO2 26 05/30/2024    Lab Results  Component Value Date   CHOL 258 (H) 05/30/2024   HDL 45 05/30/2024   LDLCALC 197 (H) 05/30/2024   TRIG 93 05/30/2024   CHOLHDL 5.7 (H) 05/30/2024    Assessment/Plan:   Medication Management: - Currently strategy sufficient to maintain appropriate adherence to prescribed medication regimen - recommend use of weekly pill box to organize medications - Discussed collaboration with local pharmacies for adherence packaging. Reviewed local pharmacies with adherence packaging options. Patient elects to continue current strategy  - reviewed all medications - appeared to be taking rosuvastatin  and atorvastatin  at the same time - separated atorvastatin  to discard and will now just take rosuvastatin  40mg .  - contacted pharmacy to have dexcom g7 processed - contacted [facility], [place] required - they plan to send to endocrinologists office  Follow Up Plan: will request  VBCI referral through SW for financial assistance related to housing support and help navigating obtaining the process of obtaining disability.   07/17/2024 update: - plans to talk with Lyle this afternoon regarding SW needs, had missed original appointment. No update on disability status - review hypoglycemia management and prevention -  reviewed PA status of Dexcom G7 - everything is approved, wearing for 4th day now.  - no financial issues for medications expressed.  - agreed on f/u in three months.    Lang Sieve, PharmD, BCGP Clinical Pharmacist  [phone]

## 2024-07-17 NOTE — Patient Instructions (Signed)
 Visit Information  Thank you for taking time to visit with me today. Please don't hesitate to contact me if I can be of assistance to you in the future  Your next care management appointment is by telephone on 07/24/24 at 1:30 pm with VBCI BSW  Referral to BSW and to find help.  Please call the care guide team at [phone] if you need to cancel, schedule, or reschedule an appointment.   Please call the Suicide and Crisis Lifeline: 988 call the [place]  National Suicide Prevention Lifeline: (276) 409-9197 or TTY: (915)859-9319 TTY 213-454-5765) to talk to a trained counselor call 1-800-273-TALK (toll free, 24 hour hotline) go to [facility] [address] [place] [phone]) call the [facility] Crisis Line: [phone] call 911 if you are experiencing a Mental Health or Behavioral Health Crisis or need someone to talk to.  Lyle Rung, BSW, MSW, LCSW Licensed Clinical Social Worker [employer]   [facility] [email or web address] Direct Dial : [phone]

## 2024-07-17 NOTE — Patient Outreach (Signed)
 Complex Care Management   Visit Note  07/17/2024  Name:  Sonya James MRN: [id number] DOB: 04/28/1966  Situation: Referral received for Complex Care Management related to Mental/Behavioral Health diagnosis anxiety/depression. I obtained verbal consent from Patient.  Visit completed with Patient  on the phone  Background:   Past Medical History:  Diagnosis Date   Anemia    Anxiety    Arthritis    Asthma 05/25/2013   Autonomic neuropathy    Cancer of endometrium (HCC) 10/18/2015   Cellulitis, face 05/03/2021   Constipation    COPD (chronic obstructive pulmonary disease) (HCC)    CTS (carpal tunnel syndrome)    Depression    Diabetes mellitus without complication (HCC)    Dysphagia 12/14/2016   Encounter for screening colonoscopy    Endometrial cancer (HCC) 2017   Gastritis without bleeding    GERD (gastroesophageal reflux disease)    Gross hematuria 05/11/2019   Headache(784.0)    Heavy menses 10/11/2015   HTN (hypertension)    Hypercholesterolemia    Hypothyroidism    IBS (irritable bowel syndrome)    Osteoarthritis    osteoarthritis   Pancreatitis    per patient    Recurrent boils    buttocks and low back.   S/P laparoscopic hysterectomy 11/04/2015   Shortness of breath    Sleep apnea    CPAP machine   Uncontrolled type 2 diabetes mellitus with diabetic polyneuropathy, with long-term current use of insulin  08/04/2017   Uterine cancer (HCC) 2017   Vitamin D  deficiency     Assessment: Patient Reported Symptoms:  Cognitive Cognitive Status: No symptoms reported Cognitive/Intellectual Conditions Management [RPT]: Behavior Disorders Behavior Disorders: Depression/Anxiety Dx   Health Maintenance Behaviors: Annual physical exam Healing Pattern: Average Health Facilitated by: Rest  Neurological Neurological Review of Symptoms: Headaches Neurological Management Strategies: Routine screening Neurological Self-Management Outcome: 4 (good)  HEENT HEENT Symptoms  Reported: No symptoms reported HEENT Management Strategies: Routine screening HEENT Self-Management Outcome: 4 (good)    Cardiovascular Cardiovascular Symptoms Reported: No symptoms reported Does patient have uncontrolled Hypertension?: Yes Is patient checking Blood Pressure at home?: Yes Cardiovascular Management Strategies: Routine screening Cardiovascular Self-Management Outcome: 4 (good)  Respiratory Respiratory Symptoms Reported: Shortness of breath Additional Respiratory Details: COPD Respiratory Management Strategies: Coping strategies Respiratory Self-Management Outcome: 4 (good)  Endocrine Endocrine Symptoms Reported: Headaches, Increased urination Is patient diabetic?: Yes Is patient checking blood sugars at home?: Yes Endocrine Self-Management Outcome: 2 (bad)  Gastrointestinal Gastrointestinal Symptoms Reported: Other Other Gastrointestinal Symptoms: IBS Gastrointestinal Management Strategies: Adequate rest, Medication therapy Gastrointestinal Self-Management Outcome: 3 (uncertain)    Genitourinary Genitourinary Symptoms Reported: No symptoms reported Genitourinary Management Strategies: Incontinence garment/pad Genitourinary Self-Management Outcome: 4 (good)  Integumentary Integumentary Symptoms Reported: Wound Skin Management Strategies: Coping strategies Skin Self-Management Outcome: 3 (uncertain)  Musculoskeletal Musculoskelatal Symptoms Reviewed: Unsteady gait Musculoskeletal Management Strategies: Routine screening Musculoskeletal Self-Management Outcome: 4 (good)      Psychosocial Psychosocial Symptoms Reported: Sadness - if selected complete PHQ 2-9 Additional Psychological Details: Pt reports that she does not need behavioral health referrals at this time as she has a therapist in [place] that she sees regularly and has built great rapport with Behavioral Management Strategies: Coping strategies, Medication therapy Behavioral Health Self-Management Outcome: 3  (uncertain) Major Change/Loss/Stressor/Fears (CP): Medical condition, self Techniques to Cope with Loss/Stress/Change: Diversional activities, Counseling Quality of Family Relationships: stressful, non-existent Do you feel physically threatened by others?: No    07/17/2024    PHQ2-9 Depression Screening   Little interest  or pleasure in doing things Nearly every day  Feeling down, depressed, or hopeless Several days  PHQ-2 - Total Score 4  Trouble falling or staying asleep, or sleeping too much Not at all  Feeling tired or having little energy Nearly every day  Poor appetite or overeating  Not at all  Feeling bad about yourself - or that you are a failure or have let yourself or your family down Nearly every day  Trouble concentrating on things, such as reading the newspaper or watching television Several days  Moving or speaking so slowly that other people could have noticed.  Or the opposite - being so fidgety or restless that you have been moving around a lot more than usual Several days  Thoughts that you would be better off dead, or hurting yourself in some way Not at all  PHQ2-9 Total Score 12  If you checked off any problems, how difficult have these problems made it for you to do your work, take care of things at home, or get along with other people Somewhat difficult  Depression Interventions/Treatment Medication, Counseling (Pt has a current long term therapist in [place] and denied needing behavioral health resources)    There were no vitals filed for this visit.  Medications Reviewed Today     Reviewed by [name], LCSW (Social Worker) on 07/17/24 at 1508  Med List Status: <None>   Medication Order Taking? Sig Documenting Provider Last Dose Status Informant  Accu-Chek Softclix Lancets lancets [id number]  To test glucose 4 times a day Nida, Gebreselassie W, MD  Active Self  albuterol  (PROVENTIL ) (2.5 MG/3ML) 0.083% nebulizer solution [id number]  Take 3 mLs (2.5 mg total) by  nebulization every 6 (six) hours as needed for wheezing or shortness of breath. [name], MD  Active   albuterol  (VENTOLIN  HFA) 108 [phone] Base) MCG/ACT inhaler [id number]  Inhale 2 puffs into the lungs every 6 (six) hours as needed for wheezing or shortness of breath. [name], MD  Active   amLODipine  (NORVASC ) 10 MG tablet [id number]  Take 1 tablet (10 mg total) by mouth daily. [name], MD  Active   apixaban  (ELIQUIS ) 5 MG TABS tablet [id number]  Take 1 tablet (5 mg total) by mouth 2 (two) times daily. On 08/12/17 @ 9PM, start 1 tab (5 mg) two times daily. [name], MD  Active   Blood Glucose Monitoring Suppl (ACCU-CHEK GUIDE ME) w/Device KIT [id number]  1 Piece by Does not apply route as directed. Nida, Gebreselassie W, MD  Active Self  Blood Glucose Monitoring Suppl DEVI [id number]  1 each by Does not apply route in the morning, at noon, and at bedtime. May substitute to any manufacturer covered by patient's insurance. [name], MD  Active   clotrimazole  (GYNE-LOTRIMIN  3) 2 % vaginal cream [id number]  Place 1 Applicatorful vaginally at bedtime.  Patient taking differently: Place 1 Applicatorful vaginally at bedtime as needed.   Antonetta Rollene BRAVO, MD  Active Self           Med Note GARNET, ASHLEY M   Fri May 09, 2024 11:41 AM)    Continuous Glucose Sensor (DEXCOM G7 SENSOR) [place] [id number]  1 Device by Does not apply route as directed. [name], Ibtehal Jaralla, MD  Active   DULoxetine  (CYMBALTA ) 60 MG capsule [id number]  Take 1 capsule (60 mg total) by mouth daily. [name], MD  Active   empagliflozin  (JARDIANCE ) 25 MG TABS tablet [id number]  Take  1 tablet (25 mg total) by mouth daily before breakfast. [name], Ibtehal Jaralla, MD  Active   esomeprazole  (NEXIUM ) 40 MG capsule [id number]  TAKE 1 CAPSULE BY MOUTH TWICE DAILY BEFORE MEAL(S) Shirlean Therisa ORN, NP  Active   furosemide  (LASIX ) 40 MG tablet [id number]  Take 1 tablet by mouth once daily Patel, Rutwik K,  MD  Active   glucose blood (ACCU-CHEK GUIDE) test strip [id number]  Test glucose 4 times a day Nida, Gebreselassie W, MD  Active Self  insulin  glargine (LANTUS  SOLOSTAR) 100 UNIT/ML Solostar Pen [id number]  Inject 48 Units into the skin daily.  Patient taking differently: Inject 48 Units into the skin 2 (two) times daily.   [name], Ibtehal Jaralla, MD  Active   insulin  lispro (HUMALOG  KWIKPEN) 100 UNIT/ML KwikPen [id number]  Inject 10 Units into the skin 3 (three) times daily. [name], [name], MD  Active   Insulin  Pen Needle 31G X 5 MM MISC [id number]  1 Device by Does not apply route in the morning, at noon, in the evening, and at bedtime. [name], Ibtehal Jaralla, MD  Active   labetalol  (NORMODYNE ) 100 MG tablet [id number]  Take 0.5 tablets (50 mg total) by mouth 2 (two) times daily. [name], MD  Active   linaclotide  (LINZESS ) 290 MCG CAPS capsule [id number]  TAKE 1 CAPSULE BY MOUTH ONCE DAILY BEFORE BREAKFAST Shirlean Therisa ORN, NP  Active   metFORMIN  (GLUCOPHAGE -XR) 500 MG 24 hr tablet [id number]  Take 1 tablet (500 mg total) by mouth 2 (two) times daily with a meal. [name], [name], MD  Active   Misc. Devices MISC [id number]  Blood pressure monitoring device [name], MD  Active Self  naloxone [facility]) nasal spray 4 mg/0.1 mL [id number]  SMARTSIG:Spray(s) In Nostril [provider]  Active   nitroGLYCERIN (NITRODUR - DOSED IN MG/24 HR) 0.1 mg/hr patch [id number]  0.1 mg daily. [provider]  Active   ondansetron  (ZOFRAN -ODT) 4 MG disintegrating tablet [id number]  Take 1 tablet (4 mg total) by mouth every 6 (six) hours as needed for nausea or vomiting. Shirlean Therisa ORN, NP  Active   RELION PEN NEEDLES 31G X 6 MM MISC [id number]  USE 1 PEN NEEDLE ONCE DAILY IN THE AFTERNOON [provider]  Active   rizatriptan (MAXALT) 10 MG tablet [id number]  Take 10 mg by mouth daily as needed for migraine. [provider]  Active Self  rosuvastatin   (CRESTOR ) 40 MG tablet [id number]  Take 1 tablet (40 mg total) by mouth daily. [name], Ibtehal Jaralla, MD  Active   spironolactone  (ALDACTONE ) 50 MG tablet [id number]  Take 1 tablet (50 mg total) by mouth daily. [name], MD  Active   Tiotropium Bromide Monohydrate  (SPIRIVA  RESPIMAT) 2.5 MCG/ACT AERS [id number]  Inhale 2 puffs into the lungs daily. [name], MD  Active   traMADol  (ULTRAM ) 50 MG tablet [id number]  Take 50 mg by mouth 2 (two) times daily as needed. [provider]  Active             Recommendation:   PCP Follow-up Referral to: Find Help-Food Pantry Continue Current Plan of Care  Follow Up Plan:   Telephone follow-up in 1 month Referral to BSW  Lyle Rung, BSW, MSW, [employer] Licensed Clinical Social Worker [employer]   [facility] [email or web address] Direct Dial : [phone]

## 2024-07-24 ENCOUNTER — Telehealth: Payer: Self-pay

## 2024-07-24 NOTE — Patient Instructions (Signed)
 Sonya James - I am sorry I was unable to reach you today for our scheduled appointment. I work with Tobie, Suzzane POUR, MD and am calling to support your healthcare needs. Please contact me at [phone] at your earliest convenience. I look forward to speaking with you soon.   Thank you,  Tillman Gardener, BSW [place]  [facility], [facility] Social Worker Direct Dial : [phone]  Fax: [phone] Website: [email or web address]

## 2024-07-25 ENCOUNTER — Ambulatory Visit: Admitting: Podiatry

## 2024-07-29 ENCOUNTER — Ambulatory Visit: Payer: Self-pay | Admitting: Internal Medicine

## 2024-07-30 ENCOUNTER — Other Ambulatory Visit: Payer: Self-pay | Admitting: *Deleted

## 2024-07-30 ENCOUNTER — Encounter: Payer: Self-pay | Admitting: *Deleted

## 2024-07-30 NOTE — Patient Instructions (Signed)
 Visit Information  Thank you for taking time to visit with me today. Please don't hesitate to contact me if I can be of assistance to you before our next scheduled appointment.  Your next care management appointment is by telephone on 08/29/24 at 09:30  Telephone follow-up in 1 month  Please call the care guide team at [phone] if you need to cancel, schedule, or reschedule an appointment.   Please call the Suicide and Crisis Lifeline: 988 call the [place]  National Suicide Prevention Lifeline: 641-200-5742 or TTY: 616-496-1320 TTY 731-651-8167) to talk to a trained counselor call 1-800-273-TALK (toll free, 24 hour hotline) if you are experiencing a Mental Health or Behavioral Health Crisis or need someone to talk to.  Belal Scallon RN RN Care Manager [facility] [phone]

## 2024-07-31 NOTE — Patient Outreach (Signed)
 Complex Care Management   Visit Note  07/31/2024  Name:  Sonya James MRN: [id number] DOB: [birth year]  Situation: Referral received for Complex Care Management related to COPD and Diabetes with Complications I obtained verbal consent from Patient.  Visit completed with Patient  on the phone  Background:   Past Medical History:  Diagnosis Date   Anemia    Anxiety    Arthritis    Asthma 05/25/2013   Autonomic neuropathy    Cancer of endometrium (HCC) 10/18/2015   Cellulitis, face 05/03/2021   Constipation    COPD (chronic obstructive pulmonary disease) (HCC)    CTS (carpal tunnel syndrome)    Depression    Diabetes mellitus without complication (HCC)    Dysphagia 12/14/2016   Encounter for screening colonoscopy    Endometrial cancer (HCC) 2017   Gastritis without bleeding    GERD (gastroesophageal reflux disease)    Gross hematuria 05/11/2019   Headache(784.0)    Heavy menses 10/11/2015   HTN (hypertension)    Hypercholesterolemia    Hypothyroidism    IBS (irritable bowel syndrome)    Osteoarthritis    osteoarthritis   Pancreatitis    per patient    Recurrent boils    buttocks and low back.   S/P laparoscopic hysterectomy 11/04/2015   Shortness of breath    Sleep apnea    CPAP machine   Uncontrolled type 2 diabetes mellitus with diabetic polyneuropathy, with long-term current use of insulin  08/04/2017   Uterine cancer (HCC) 2017   Vitamin D  deficiency     Assessment: Patient Reported Symptoms:  Cognitive Cognitive Status: No symptoms reported   Health Maintenance Behaviors: None Healing Pattern: Average  Neurological Neurological Review of Symptoms: No symptoms reported Neurological Management Strategies: Routine screening Neurological Self-Management Outcome: 3 (uncertain)  HEENT HEENT Symptoms Reported: Sudden change or loss of vision HEENT Self-Management Outcome: 3 (uncertain)    Cardiovascular Cardiovascular Symptoms Reported: Chest pain or  discomfort, Lightheadness, Swelling in legs or feet Does patient have uncontrolled Hypertension?: Yes Is patient checking Blood Pressure at home?: Yes Cardiovascular Management Strategies: Routine screening Weight: 214 lb (97.1 kg) Cardiovascular Self-Management Outcome: 3 (uncertain)  Respiratory Respiratory Symptoms Reported: Chest tightness, Wheezing Additional Respiratory Details: copd Respiratory Self-Management Outcome: 3 (uncertain)  Endocrine Endocrine Symptoms Reported: Hyperglycemia Is patient diabetic?: Yes Is patient checking blood sugars at home?: No List most recent blood sugar readings, include date and time of day: hasnt had cgm since monday Endocrine Self-Management Outcome: 2 (bad)  Gastrointestinal Gastrointestinal Symptoms Reported: No symptoms reported Gastrointestinal Management Strategies: Adequate rest Gastrointestinal Self-Management Outcome: 4 (good)    Genitourinary Genitourinary Symptoms Reported: No symptoms reported Genitourinary Self-Management Outcome: 4 (good)  Integumentary Integumentary Symptoms Reported: No symptoms reported Skin Self-Management Outcome: 4 (good)  Musculoskeletal Musculoskelatal Symptoms Reviewed: No symptoms reported Musculoskeletal Management Strategies: Adequate rest Musculoskeletal Self-Management Outcome: 4 (good) Falls in the past year?: No Number of falls in past year: 1 or less Was there an injury with Fall?: No Fall Risk Category Calculator: 0 Patient Fall Risk Level: Low Fall Risk Patient at Risk for Falls Due to: No Fall Risks  Psychosocial Psychosocial Symptoms Reported: No symptoms reported Behavioral Management Strategies: Adequate rest Behavioral Health Self-Management Outcome: 4 (good) Major Change/Loss/Stressor/Fears (CP): Denies Do you feel physically threatened by others?: No    07/31/2024    PHQ2-9 Depression Screening   Little interest or pleasure in doing things    Feeling down, depressed, or hopeless     PHQ-2 - Total  Score    Trouble falling or staying asleep, or sleeping too much    Feeling tired or having little energy    Poor appetite or overeating     Feeling bad about yourself - or that you are a failure or have let yourself or your family down    Trouble concentrating on things, such as reading the newspaper or watching television    Moving or speaking so slowly that other people could have noticed.  Or the opposite - being so fidgety or restless that you have been moving around a lot more than usual    Thoughts that you would be better off dead, or hurting yourself in some way    PHQ2-9 Total Score    If you checked off any problems, how difficult have these problems made it for you to do your work, take care of things at home, or get along with other people    Depression Interventions/Treatment      Today's Vitals   07/30/24 1444  BP: 114/60  Weight:    Pain Scale: 0-10 Pain Score: 7  Pain Type: Chronic pain Pain Location: Knee Pain Orientation: Left, Right Pain Descriptors / Indicators: Aching, Constant Pain Intervention(s): Medication (See eMAR) Multiple Pain Sites: No  Medications Reviewed Today     Reviewed by Nivia, Tavarius Grewe , RN (Registered Nurse) on 07/30/24 at 1439  Med List Status: <None>   Medication Order Taking? Sig Documenting Provider Last Dose Status Informant  Accu-Chek Softclix Lancets lancets [id number] Yes To test glucose 4 times a day Nida, Gebreselassie W, MD  Active Self  albuterol  (PROVENTIL ) (2.5 MG/3ML) 0.083% nebulizer solution [id number]  Take 3 mLs (2.5 mg total) by nebulization every 6 (six) hours as needed for wheezing or shortness of breath.  Patient not taking: Reported on 07/30/2024   [name], MD  Active   albuterol  (VENTOLIN  HFA) 108 [phone] Base) MCG/ACT inhaler [id number]  Inhale 2 puffs into the lungs every 6 (six) hours as needed for wheezing or shortness of breath.  Patient not taking: Reported on 07/30/2024   [name],  MD  Active   amLODipine  (NORVASC ) 10 MG tablet [id number] Yes Take 1 tablet (10 mg total) by mouth daily. [name], MD  Active   apixaban  (ELIQUIS ) 5 MG TABS tablet [id number] Yes Take 1 tablet (5 mg total) by mouth 2 (two) times daily. On 08/12/17 @ 9PM, start 1 tab (5 mg) two times daily. [name], MD  Active   Blood Glucose Monitoring Suppl (ACCU-CHEK GUIDE ME) w/Device KIT [id number] Yes 1 Piece by Does not apply route as directed. Nida, Gebreselassie W, MD  Active Self  Blood Glucose Monitoring Suppl DEVI [id number] Yes 1 each by Does not apply route in the morning, at noon, and at bedtime. May substitute to any manufacturer covered by patient's insurance. [name], MD  Active   clotrimazole  (GYNE-LOTRIMIN  3) 2 % vaginal cream [id number]  Place 1 Applicatorful vaginally at bedtime.  Patient taking differently: Place 1 Applicatorful vaginally at bedtime as needed.   Antonetta Rollene BRAVO, MD  Active Self           Med Note GARNET, ASHLEY M   Fri May 09, 2024 11:41 AM)    Continuous Glucose Sensor (DEXCOM G7 SENSOR) [place] [id number] Yes 1 Device by Does not apply route as directed. [name], Ibtehal Jaralla, MD  Active   DULoxetine  (CYMBALTA ) 60 MG capsule [id number] Yes Take 1 capsule (60 mg total) by mouth  daily. [name], MD  Active   empagliflozin  (JARDIANCE ) 25 MG TABS tablet [id number] Yes Take 1 tablet (25 mg total) by mouth daily before breakfast. [name], Ibtehal Jaralla, MD  Active   esomeprazole  (NEXIUM ) 40 MG capsule [id number] Yes TAKE 1 CAPSULE BY MOUTH TWICE DAILY BEFORE MEAL(S) Shirlean Therisa ORN, NP  Active   furosemide  (LASIX ) 40 MG tablet [id number] Yes Take 1 tablet by mouth once daily Patel, Rutwik K, MD  Active   glucose blood (ACCU-CHEK GUIDE) test strip [id number] Yes Test glucose 4 times a day Nida, Gebreselassie W, MD  Active Self  insulin  glargine (LANTUS  SOLOSTAR) 100 UNIT/ML Solostar Pen [id number] Yes Inject 48 Units into the skin daily. [name],  Ibtehal Jaralla, MD  Active   insulin  lispro (HUMALOG  KWIKPEN) 100 UNIT/ML KwikPen [id number]  Inject 10 Units into the skin 3 (three) times daily. [name], [name], MD  Active   Insulin  Pen Needle 31G X 5 MM MISC [id number] Yes 1 Device by Does not apply route in the morning, at noon, in the evening, and at bedtime. [name], Ibtehal Jaralla, MD  Active   labetalol  (NORMODYNE ) 100 MG tablet [id number] Yes Take 0.5 tablets (50 mg total) by mouth 2 (two) times daily. [name], MD  Active   linaclotide  (LINZESS ) 290 MCG CAPS capsule [id number] Yes TAKE 1 CAPSULE BY MOUTH ONCE DAILY BEFORE BREAKFAST Shirlean Therisa ORN, NP  Active   metFORMIN  (GLUCOPHAGE -XR) 500 MG 24 hr tablet [id number] Yes Take 1 tablet (500 mg total) by mouth 2 (two) times daily with a meal. [name], [name], MD  Active   Misc. Devices MISC [id number] Yes Blood pressure monitoring device [name], MD  Active Self  naloxone [facility]) nasal spray 4 mg/0.1 mL [id number]  SMARTSIG:Spray(s) In Nostril  Patient not taking: Reported on 07/30/2024   [provider]  Active   nitroGLYCERIN (NITRODUR - DOSED IN MG/24 HR) 0.1 mg/hr patch [id number]  0.1 mg daily. [provider]  Active   ondansetron  (ZOFRAN -ODT) 4 MG disintegrating tablet [id number]  Take 1 tablet (4 mg total) by mouth every 6 (six) hours as needed for nausea or vomiting.  Patient not taking: Reported on 07/30/2024   Shirlean Therisa ORN, NP  Active   RELION PEN NEEDLES 31G X 6 MM MISC [id number] Yes USE 1 PEN NEEDLE ONCE DAILY IN THE AFTERNOON [provider]  Active   rizatriptan (MAXALT) 10 MG tablet [id number] Yes Take 10 mg by mouth daily as needed for migraine. [provider]  Active Self  rosuvastatin  (CRESTOR ) 40 MG tablet [id number] Yes Take 1 tablet (40 mg total) by mouth daily. [name], Ibtehal Jaralla, MD  Active   spironolactone  (ALDACTONE ) 50 MG tablet [id number] Yes Take 1 tablet (50 mg total) by mouth daily.  [name], MD  Active   Tiotropium Bromide Monohydrate  (SPIRIVA  RESPIMAT) 2.5 MCG/ACT AERS [id number] Yes Inhale 2 puffs into the lungs daily. [name], MD  Active   traMADol  (ULTRAM ) 50 MG tablet [id number] Yes Take 50 mg by mouth 2 (two) times daily as needed. [provider]  Active             Recommendation:   Continue Current Plan of Care  Follow Up Plan:   Telephone follow-up in 1 month  Milissa Fesperman RN RN Care Manager [facility] [phone]

## 2024-08-06 ENCOUNTER — Other Ambulatory Visit: Payer: Self-pay

## 2024-08-06 NOTE — Patient Instructions (Signed)
 Visit Information  Thank you for taking time to visit with me today. Please don't hesitate to contact me if I can be of assistance to you before our next scheduled appointment.  Our next appointment is by telephone on 08/20/24 at 11am Please call the care guide team at [phone] if you need to cancel or reschedule your appointment.   Following is a copy of your care plan:   Goals Addressed             This Visit's Progress    BSW Goals       Current SDOH Barriers:  Financial constraints related to no income Limited access to food Housing barriers  Interventions: Patient interviewed and appropriate screenings performed Referred patient to community resources  Provided patient with information about housing, extra benefits with insurance,  Discussed plans with patient for ongoing follow up and provided patient with direct contact number Advised patient to contact [employer] to apply, [facility] for options, [facility] Community Medicaid to get information on extra benefits for fresh fruits/vegetables or other benefits. Provided education on Advanced Directives Patient lives with a friend after being evicted.  Disability was approved but rescinded due to husbands income during the time of the application.  Patient was informed to wait 6-12 months for overpayment to be cleared.   Patient receives foodstamps $287 and uses food banks for extra help.          Please call 911 if you are experiencing a Mental Health or Behavioral Health Crisis or need someone to talk to.  Patient verbalized understanding of Care plan and visit instructions communicated this visit  Tillman Gardener, BSW [place]  [facility], [employer] Social Worker Direct Dial : [phone]  Fax: [phone] Website: [email or web address]

## 2024-08-06 NOTE — Patient Outreach (Signed)
 Social Drivers of Health  Community Resource and Care Coordination Visit Note   08/06/2024  Name: Sonya James MRN: [id number] DOB:04-11-1966  Situation: Referral received for [facility] needs assessment and assistance related to Housing  Financial [employer] . I obtained verbal consent from Patient.  Visit completed with Patient on the phone.   Background:   SDOH Interventions Today    Flowsheet Row Most Recent Value  SDOH Interventions   Food Insecurity Interventions Intervention Not Indicated  [Food stamps and Food bank]  Housing Interventions Intervention Not Indicated  [Lives with friend who pays bills]  Transportation Interventions Other (Comment), Patient Resources (Friends/Family)  [Medicaid transportation and family helps with errands]  Utilities Interventions Intervention Not Indicated  [Lives with friend who paids the bills]  Financial Strain Interventions Intervention Not Indicated  [Waiting for disability to resume payment in 6-12 months]     Assessment:   Goals Addressed             This Visit's Progress    BSW Goals       Current SDOH Barriers:  Financial constraints related to no income Limited access to food Housing barriers  Interventions: Patient interviewed and appropriate screenings performed Referred patient to community resources  Provided patient with information about housing, extra benefits with insurance,  Discussed plans with patient for ongoing follow up and provided patient with direct contact number Advised patient to contact [employer] to apply, [facility] for options, [facility] Community Medicaid to get information on extra benefits for fresh fruits/vegetables or other benefits. Provided education on Advanced Directives Patient lives with a friend after being evicted.  Disability was approved but rescinded due to husbands income during the time of the application.  Patient was informed to wait 6-12 months  for overpayment to be cleared.   Patient receives foodstamps $287 and uses food banks for extra help.          Recommendation:   follow up with Housing Authority and website regarding housing needs call and/or follow up with [facility] for food assistance Patient will await disability benefits in 12 months  Follow Up Plan:   Telephone follow up appointment date/time:  08/20/24 at 11am  Tillman Gardener, BSW [place]  [facility], [facility] Social Worker Direct Dial : [phone]  Fax: [phone] Website: [email or web address]

## 2024-08-14 ENCOUNTER — Encounter: Payer: Self-pay | Admitting: Podiatry

## 2024-08-14 ENCOUNTER — Ambulatory Visit (INDEPENDENT_AMBULATORY_CARE_PROVIDER_SITE_OTHER): Admitting: Podiatry

## 2024-08-14 DIAGNOSIS — D492 Neoplasm of unspecified behavior of bone, soft tissue, and skin: Secondary | ICD-10-CM

## 2024-08-14 NOTE — Progress Notes (Signed)
 Patient presents for follow-up of verrucous lesion right foot and injection left foot.  Doing well on the left for the injection was done.  Noticed 2 other verrucous lesions on the left foot.  Physical one of the right is doing much better   Physical exam:  General appearance: Pleasant, and in no acute distress. AOx3.  Vascular: Pedal pulses: DP 2/4 bilaterally, PT 2/4 bilaterally. Mild edema lower legs bilaterally. Capillary fill time immediate bilaterally.  Neurological: Grossly intact bilaterally  Dermatologic:   Verrucous lesions plantar forefoot bilaterally and at the plantar medial arch left.  Skin lines go around the lesion with pinpoint hemorrhages present.  Tenderness lateral pressure on the lesion.  Skin normal temperature bilaterally.  Skin normal color, tone, and texture bilaterally.   Musculoskeletal: Tenderness to second MTP left with palpation or range of motion.    Diagnosis: 1.  Verrucous benign neoplasm of feet bilaterally x 3  Plan: -Will do second application of Salinocaine to the lesion on the right and first treatment on the left.  Also briefly discussed the hallux nail with give her trouble explained at this point option be living with him and keeping trimmed down or permanently removed  -Applied Salinocaine compound to lesion(s) as noted in physical exam after debriding lesions to pinpoint bleeding.  Salinocaine applied to lesion(s) and covered with an occlusive dressing with Coban wrap.  Written and oral instructions given to patient.  Return as needed

## 2024-08-20 ENCOUNTER — Other Ambulatory Visit: Payer: Self-pay

## 2024-08-20 NOTE — Patient Outreach (Signed)
 Social Drivers of Health  Community Resource and Care Coordination Visit Note   08/20/2024  Name: Sonya James MRN: [id number] DOB:[birth year]  Situation: Referral received for [facility] needs assessment and assistance related to Housing  Financial Strain . I obtained verbal consent from Patient.  Visit completed with Patient on the phone.   Background:   SDOH Interventions Today    Flowsheet Row Most Recent Value  SDOH Interventions   Housing Interventions Other (Comment)  [Housing Authority is closed. Friend had not provided a date patient has to move. Patient will look for affordable housing closer to her date to resume SSA benefits.  Patient to follow up with Housing Authority next year.]  Financial Strain Interventions Other (Comment)  [Patient called [employer] but didn't want to use automated system. SW educated that system is used to direct to the correct department. Patient will call another day.]     Assessment:   Goals Addressed             This Visit's Progress    COMPLETED: BSW Goals       Current SDOH Barriers:  Financial constraints related to no income Limited access to food Housing barriers  Interventions: Patient interviewed and appropriate screenings performed Referred patient to community resources  Provided patient with information about housing, extra benefits with insurance,  Advised patient to contact [employer] next year to apply, [facility] for options, [facility] Community Medicaid to get information on extra benefits for fresh fruits/vegetables or other benefits. Collaborated with [employer] (community agency) re: and staff reports they are not accepting new applications and patient is not currently on the list. Patient receives foodstamps $287 and uses food banks for extra help. Patient reports no other needs and declines follow up          Recommendation:   follow up with Housing resources regarding  housing needs Insurance for extra benefits.  Follow Up Plan:   Patient declines further calls or assistance. [employer] will be closed. Patient has been provided contact information should new needs arise.   Tillman Gardener, BSW [place]  [facility], [facility] Social Worker Direct Dial : [phone]  Fax: [phone] Website: [email or web address]

## 2024-08-20 NOTE — Patient Instructions (Signed)

## 2024-08-29 ENCOUNTER — Telehealth: Admitting: *Deleted

## 2024-08-29 ENCOUNTER — Encounter: Payer: Self-pay | Admitting: *Deleted

## 2024-08-29 NOTE — Patient Instructions (Signed)
 Sonya James - I am sorry I was unable to reach you today for our scheduled appointment. I work with Tobie, Suzzane POUR, MD and am calling to support your healthcare needs. Please contact me at [phone] at your earliest convenience. I look forward to speaking with you soon.   Thank you,  [name], BSN RN [facility], [facility] RN Care Manager Direct Dial : [phone]  Fax: [phone]

## 2024-09-08 ENCOUNTER — Other Ambulatory Visit: Payer: Self-pay | Admitting: *Deleted

## 2024-09-08 NOTE — Patient Instructions (Signed)
 Visit Information  Sonya James was given information about Medicaid Managed Care team care coordination services as a part of their [facility] Community Plan Medicaid benefit.   If you would like to schedule transportation through your [facility], please call the following number at least 2 days in advance of your appointment: [phone]   Rides for urgent appointments can also be made after hours by calling Member Services.  Call the Behavioral Health Crisis Line at [phone], at any time, 24 hours a day, 7 days a week. If you are in danger or need immediate medical attention call 911.  Please see education materials related to Hypertension, Diabetes provided by MyChart link.  Care plan and visit instructions communicated with the patient verbally today. Patient agrees to receive a copy in MyChart. Active MyChart status and patient understanding of how to access instructions and care plan via MyChart confirmed with patient.     Telephone follow up appointment with Managed Medicaid care management team member scheduled for:10-09-2024 at 1:30 pm  [name], BSN RN [facility]  [facility], [facility] RN Care Manager Direct Dial : [phone]  Fax: [phone]   Following is a copy of your plan of care:   Goals Addressed             This Visit's Progress    VBCI RN Care Plan: DM   No change    Problems:  Chronic Disease Management support and education needs related to DMII  Goal: Over the next 90 days the Patient will attend all scheduled medical appointments: with Providers as evidenced by appointment visit encounters in EMR         continue to work with RN Care Manager and/or Social Worker to address care management and care coordination needs related to DMII as evidenced by adherence to care management team scheduled appointments     demonstrate a decrease DMII in exacerbations as evidenced by A1C within normal range   not experience hospital admission as evidenced by review of electronic medical record. Hospital Admissions in last 6 months = 0 take all medications exactly as prescribed and will call provider for medication related questions as evidenced by communication with Provider regarding any medication concerns     verbalize understanding of plan for management of DMII as evidenced by stating in own words work with social worker to address Financial constraints related to pending Social Security benefits, Limited access to food, and Mental Health Concerns  related to the management of DMII as evidenced by review of electronic medical record and patient or social worker report      Interventions:   Diabetes Interventions: Assessed patient's understanding of A1c goal: <6.5% Provided education to patient about basic DM disease process. A1C remains elevated. Reports compliance with Dexcom, however unable to provide any readings due to device not being turned on.  Noncompliance with dietary recommendations -  RNCM discussed with patient possible complications if her diabetes continues to remain unmanaged. Encouraged patient to become proactive in managing her diabetes. Limited to moderate understanding noted. Reviewed medications with patient and discussed importance of medication adherence. Reports compliance with medications Counseled on importance of regular laboratory monitoring as prescribed. Labs up to date. Discussed plans with patient for ongoing care management follow up and provided patient with direct contact information for care management team Provided patient with written educational materials related to hypo and hyperglycemia and importance of correct treatment. Noncompliant with recommended treatment Reviewed scheduled/upcoming provider appointments including:  Advised patient, providing education and rationale, to check cbg daily and record, calling provider for findings outside established  parameters. Reviewed with patient goal fasting<130, post prandial <180.  Review of patient status, including review of consultants reports, relevant laboratory and other test results, and medications completed Screening for signs and symptoms of depression related to chronic disease state  Assessed social determinant of health barriers Lab Results  Component Value Date   HGBA1C >15.5 (H) 05/30/2024    Patient Self-Care Activities:  Attend all scheduled provider appointments Call pharmacy for medication refills 3-7 days in advance of running out of medications Call provider office for new concerns or questions  Notify RN Care Manager of [facility] call rescheduling needs Perform all self care activities independently  Take medications as prescribed   Work with the social worker to address care coordination needs and will continue to work with the clinical team to address health care and disease management related needs schedule appointment with eye doctor check blood sugar at prescribed times: before meals and at bedtime check feet daily for cuts, sores or redness enter blood sugar readings and medication or insulin  into daily log take the blood sugar log to all doctor visits set goal weight trim toenails straight across drink 6 to 8 glasses of water each day eat fish at least once per week fill half of plate with vegetables set a realistic goal switch to sugar-free drinks wash and dry feet carefully every day wear comfortable, well-fitting shoes  Plan:  Telephone follow up appointment with care management team member scheduled for:  10-09-2024 at 1:30 pm           VBCI RN Care Plan: HTN   Improving    Problems:  Chronic Disease Management support and education needs related to HTN  Goal: Over the next 90 days the Patient will attend all scheduled medical appointments: with providers as evidenced by appointment visit encounters in EMR          continue to work with RN Care Manager  and/or Social Worker to address care management and care coordination needs related to HTN as evidenced by adherence to care management team scheduled appointments     demonstrate a decrease HTN in exacerbations as evidenced by reported BP readings within normal range demonstrate ongoing self health care management ability of hypertension as evidenced by self monitoring of blood pressure, following Dash diet and engaging in regular physical activity      not experience hospital admission as evidenced by review of electronic medical record. Hospital Admissions in last 6 months = 0 take all medications exactly as prescribed and will call provider for medication related questions as evidenced by communication with Provider regarding any medications issues    work with child psychotherapist to address Financial constraints related to pending Social Security checks and Mental Health Concerns  related to the management of HTN as evidenced by review of electronic medical record and patient or social worker report      Interventions:   Hypertension Interventions: Last practice recorded BP readings:  BP Readings from Last 3 Encounters:  09/08/24 (!) 188/67  07/30/24 114/60  07/16/24 (!) 169/102   Most recent eGFR/CrCl:  Lab Results  Component Value Date   EGFR 97 05/30/2024    No components found for: CRCL  Evaluation of current treatment plan related to hypertension self management and patient's adherence to plan as established by provider. Reports monitoring BP occasionally. Checked during outreach, noted to be elevated. Education provided  on goals for SBP <140 DBP<90. Patient verbalized full understanding. Provided education to patient re: stroke prevention, s/s of heart attack and stroke. Education provided Reviewed medications with patient and discussed importance of compliance.  Counseled on the importance of exercise goals with target of 150 minutes per week. Declines due to issues with her  feet Discussed plans with patient for ongoing care management follow up and provided patient with direct contact information for care management team Advised patient, providing education and rationale, to monitor blood pressure daily and record, calling PCP for findings outside established parameters. RNCM reviewed with patient goal SBP<140, DBP<90. Patient reports understanding the need to monitor her blood pressure Reviewed scheduled/upcoming provider appointments including:  Provided education on prescribed diet DASH  Screening for signs and symptoms of depression related to chronic disease state none noted Assessed social determinant of health barriers  Patient Self-Care Activities:  Attend all scheduled provider appointments Call pharmacy for medication refills 3-7 days in advance of running out of medications Call provider office for new concerns or questions  Notify RN Care Manager of [facility] call rescheduling needs Perform all self care activities independently  Take medications as prescribed   Work with the social worker to address care coordination needs and will continue to work with the clinical team to address health care and disease management related needs check blood pressure daily write blood pressure results in a log or diary learn about high blood pressure take blood pressure log to all doctor appointments call doctor for signs and symptoms of high blood pressure keep all doctor appointments take medications for blood pressure exactly as prescribed begin an exercise program eat more whole grains, fruits and vegetables, lean meats and healthy fats limit salt intake to 1500 mg/day  Plan:  Telephone follow up appointment with care management team member scheduled for:  10-09-2024 at 1:30 pm

## 2024-09-08 NOTE — Patient Outreach (Signed)
 Complex Care Management   Visit Note  09/08/2024  Name:  Sonya James MRN: [id number] DOB: 1966/04/14  Situation: Referral received for Complex Care Management related to Diabetes with Complications and HTN I obtained verbal consent from Patient.  Visit completed with Patient  on the phone  Background:   Past Medical History:  Diagnosis Date   Anemia    Anxiety    Arthritis    Asthma 05/25/2013   Autonomic neuropathy    Cancer of endometrium (HCC) 10/18/2015   Cellulitis, face 05/03/2021   Constipation    COPD (chronic obstructive pulmonary disease) (HCC)    CTS (carpal tunnel syndrome)    Depression    Diabetes mellitus without complication (HCC)    Dysphagia 12/14/2016   Encounter for screening colonoscopy    Endometrial cancer (HCC) 2017   Gastritis without bleeding    GERD (gastroesophageal reflux disease)    Gross hematuria 05/11/2019   Headache(784.0)    Heavy menses 10/11/2015   HTN (hypertension)    Hypercholesterolemia    Hypothyroidism    IBS (irritable bowel syndrome)    Osteoarthritis    osteoarthritis   Pancreatitis    per patient    Recurrent boils    buttocks and low back.   S/P laparoscopic hysterectomy 11/04/2015   Shortness of breath    Sleep apnea    CPAP machine   Uncontrolled type 2 diabetes mellitus with diabetic polyneuropathy, with long-term current use of insulin  08/04/2017   Uterine cancer (HCC) 2017   Vitamin D  deficiency     Assessment: Patient Reported Symptoms:  Cognitive Cognitive Status: No symptoms reported   Health Maintenance Behaviors: None Healing Pattern: Average Health Facilitated by: Rest  Neurological Neurological Review of Symptoms: Headaches Neurological Management Strategies: Routine screening Neurological Self-Management Outcome: 3 (uncertain)  HEENT HEENT Symptoms Reported: Tearing HEENT Management Strategies: Coping strategies, Routine screening HEENT Self-Management Outcome: 3 (uncertain)     Cardiovascular Cardiovascular Symptoms Reported: No symptoms reported Does patient have uncontrolled Hypertension?: Yes Is patient checking Blood Pressure at home?: Yes Patient's Recent BP reading at home: 188/67 Cardiovascular Management Strategies: Medication therapy Cardiovascular Self-Management Outcome: 3 (uncertain)  Respiratory Respiratory Symptoms Reported: No symptoms reported Respiratory Self-Management Outcome: 4 (good)  Endocrine Endocrine Symptoms Reported: Headaches, Increased thirst Is patient diabetic?: Yes Is patient checking blood sugars at home?: Yes Endocrine Self-Management Outcome: 2 (bad)  Gastrointestinal Gastrointestinal Symptoms Reported: Constipation Other Gastrointestinal Symptoms: ibs      Genitourinary Genitourinary Symptoms Reported: No symptoms reported    Integumentary Integumentary Symptoms Reported: No symptoms reported Skin Management Strategies: Coping strategies Skin Self-Management Outcome: 4 (good)  Musculoskeletal Musculoskelatal Symptoms Reviewed: Back pain Musculoskeletal Management Strategies: Routine screening, Medication therapy Musculoskeletal Self-Management Outcome: 3 (uncertain) Falls in the past year?: Yes Number of falls in past year: 1 or less Was there an injury with Fall?: No Fall Risk Category Calculator: 1 Patient Fall Risk Level: Low Fall Risk Patient at Risk for Falls Due to: No Fall Risks Fall risk Follow up: Falls evaluation completed, Education provided  Psychosocial Psychosocial Symptoms Reported: No symptoms reported Behavioral Management Strategies: Coping strategies Behavioral Health Self-Management Outcome: 4 (good) Major Change/Loss/Stressor/Fears (CP): Denies Techniques to Cope with Loss/Stress/Change: Diversional activities      09/08/2024    PHQ2-9 Depression Screening   Little interest or pleasure in doing things Not at all  Feeling down, depressed, or hopeless Not at all  PHQ-2 - Total Score 0   Trouble falling or staying asleep, or sleeping too  much    Feeling tired or having little energy    Poor appetite or overeating     Feeling bad about yourself - or that you are a failure or have let yourself or your family down    Trouble concentrating on things, such as reading the newspaper or watching television    Moving or speaking so slowly that other people could have noticed.  Or the opposite - being so fidgety or restless that you have been moving around a lot more than usual    Thoughts that you would be better off dead, or hurting yourself in some way    PHQ2-9 Total Score    If you checked off any problems, how difficult have these problems made it for you to do your work, take care of things at home, or get along with other people    Depression Interventions/Treatment      Today's Vitals   09/08/24 1349  BP: (!) 188/67  Pulse: 65   Pain Score: 0-No pain  Medications Reviewed Today     Reviewed by [name], RN (Registered Nurse) on 09/08/24 at 1340  Med List Status: <None>   Medication Order Taking? Sig Documenting Provider Last Dose Status Informant  Accu-Chek Softclix Lancets lancets [id number]  To test glucose 4 times a day Nida, Gebreselassie W, MD  Active Self  albuterol  (PROVENTIL ) (2.5 MG/3ML) 0.083% nebulizer solution [id number]  Take 3 mLs (2.5 mg total) by nebulization every 6 (six) hours as needed for wheezing or shortness of breath.  Patient not taking: Reported on 08/14/2024   [name], MD  Active   albuterol  (VENTOLIN  HFA) [phone] Base) MCG/ACT inhaler [id number]  Inhale 2 puffs into the lungs every 6 (six) hours as needed for wheezing or shortness of breath.  Patient not taking: Reported on 08/14/2024   [name], MD  Active   amLODipine  (NORVASC ) 10 MG tablet [id number]  Take 1 tablet (10 mg total) by mouth daily. [name], MD  Active   apixaban  (ELIQUIS ) 5 MG TABS tablet [id number]  Take 1 tablet (5 mg total) by mouth 2 (two) times  daily. On 08/12/17 @ 9PM, start 1 tab (5 mg) two times daily. [name], MD  Active   Blood Glucose Monitoring Suppl (ACCU-CHEK GUIDE ME) w/Device KIT [id number]  1 Piece by Does not apply route as directed. Nida, Gebreselassie W, MD  Active Self  Blood Glucose Monitoring Suppl DEVI [id number]  1 each by Does not apply route in the morning, at noon, and at bedtime. May substitute to any manufacturer covered by patient's insurance. [name], MD  Active   clotrimazole  (GYNE-LOTRIMIN  3) 2 % vaginal cream [id number]  Place 1 Applicatorful vaginally at bedtime.  Patient not taking: Reported on 08/14/2024   Antonetta Rollene BRAVO, MD  Active Self           Med Note GARNET, ROSINA HERO   Fri May 09, 2024 11:41 AM)    Continuous Glucose Sensor (DEXCOM G7 [place]) [place] [id number]  1 Device by Does not apply route as directed. [name], Ibtehal Jaralla, MD  Active   DULoxetine  (CYMBALTA ) 60 MG capsule [id number]  Take 1 capsule (60 mg total) by mouth daily. [name], MD  Active   empagliflozin  (JARDIANCE ) 25 MG TABS tablet [id number]  Take 1 tablet (25 mg total) by mouth daily before breakfast. [name], Ibtehal Jaralla, MD  Active   esomeprazole  (NEXIUM ) 40 MG capsule [id number]  TAKE  1 CAPSULE BY MOUTH TWICE DAILY BEFORE MEAL(S) Shirlean Therisa ORN, NP  Active   furosemide  (LASIX ) 40 MG tablet [id number]  Take 1 tablet by mouth once daily Patel, Rutwik K, MD  Active   glucose blood (ACCU-CHEK GUIDE) test strip [id number]  Test glucose 4 times a day Nida, Gebreselassie W, MD  Active Self  insulin  glargine (LANTUS  SOLOSTAR) 100 UNIT/ML Solostar Pen [id number]  Inject 48 Units into the skin daily. [name], Ibtehal Jaralla, MD  Active   insulin  lispro (HUMALOG  KWIKPEN) 100 UNIT/ML KwikPen [id number]  Inject 10 Units into the skin 3 (three) times daily. [name], [name], MD  Active   Insulin  Pen Needle 31G X 5 MM MISC [id number]  1 Device by Does not apply route in the morning, at noon, in the  evening, and at bedtime. [name], Ibtehal Jaralla, MD  Active   labetalol  (NORMODYNE ) 100 MG tablet [id number]  Take 0.5 tablets (50 mg total) by mouth 2 (two) times daily. [name], MD  Active   linaclotide  (LINZESS ) 290 MCG CAPS capsule [id number]  TAKE 1 CAPSULE BY MOUTH ONCE DAILY BEFORE BREAKFAST Shirlean Therisa ORN, NP  Active   metFORMIN  (GLUCOPHAGE -XR) 500 MG 24 hr tablet [id number]  Take 1 tablet (500 mg total) by mouth 2 (two) times daily with a meal. [name], [name], MD  Active   Misc. Devices MISC [id number]  Blood pressure monitoring device [name], MD  Active Self  naloxone [facility]) nasal spray 4 mg/0.1 mL [id number]  SMARTSIG:Spray(s) In Nostril  Patient not taking: Reported on 08/14/2024   [provider]  Active   nitroGLYCERIN (NITRODUR - DOSED IN MG/24 HR) 0.1 mg/hr patch [id number]  0.1 mg daily. [provider]  Active   ondansetron  (ZOFRAN -ODT) 4 MG disintegrating tablet [id number]  Take 1 tablet (4 mg total) by mouth every 6 (six) hours as needed for nausea or vomiting.  Patient not taking: Reported on 08/14/2024   Shirlean Therisa ORN, NP  Active   RELION PEN NEEDLES 31G X 6 MM MISC [id number]  USE 1 PEN NEEDLE ONCE DAILY IN THE AFTERNOON [provider]  Active   rizatriptan (MAXALT) 10 MG tablet [id number]  Take 10 mg by mouth daily as needed for migraine. [provider]  Active Self  rosuvastatin  (CRESTOR ) 40 MG tablet [id number]  Take 1 tablet (40 mg total) by mouth daily. [name], Ibtehal Jaralla, MD  Active   spironolactone  (ALDACTONE ) 50 MG tablet [id number]  Take 1 tablet (50 mg total) by mouth daily. [name], MD  Active   Tiotropium Bromide Monohydrate  (SPIRIVA  RESPIMAT) 2.5 MCG/ACT AERS [id number]  Inhale 2 puffs into the lungs daily. [name], MD  Active   traMADol  (ULTRAM ) 50 MG tablet [id number]  Take 50 mg by mouth 2 (two) times daily as needed. [provider]  Active              Recommendation:   Continue Current Plan of Care  Follow Up Plan:   Telephone follow-up in 1 month  [name], BSN RN [facility], [facility] RN Care Manager Direct Dial : [phone]  Fax: [phone]

## 2024-10-02 ENCOUNTER — Telehealth: Payer: Self-pay | Admitting: Internal Medicine

## 2024-10-02 NOTE — Telephone Encounter (Signed)
 Copied from CRM #[id number]. Topic: General - Other >> Oct 02, 2024 10:32 AM Zebedee SAUNDERS wrote: Reason for CRM: Pt stated she has a yeast infection, needs Ketoconazole  and any thing else for yeast infection. Please send to: [facility] [address], [place] - [address] [place] [address] Phone: [phone] Fax: [phone]

## 2024-10-08 ENCOUNTER — Ambulatory Visit: Payer: Self-pay

## 2024-10-08 NOTE — Telephone Encounter (Signed)
 FYI Only or Action Required?: Action required by provider: clinical question for provider and new med request, 1st requested on 10/02/24.  Patient was last seen in primary care on 05/30/2024 by [name], MD.  Called Nurse Triage reporting Vaginitis.  Symptoms began 2 weeks ago.  Interventions attempted: Nothing.  Symptoms are: gradually worsening.  Triage Disposition: See Physician Within 24 Hours  Patient/caregiver understands and will follow disposition?: No, wishes to speak with PCP   Reason for Disposition  MODERATE-SEVERE itching (i.e., interferes with school, work, or sleep)  Answer Assessment - Initial Assessment Questions Patient calling in upset that she never received medication she requested on 10/02/24, she never received communication that it would not be sent or if she needed an appointment or other advice. Offered acute visit which she refuses and states that Dr. Tobie has called in medication for vaginal yeast infection in the past. Patient would medication sent to [facility] today, patient insisting on follow up call back from clinic today on phone [phone]   1. SYMPTOM: What's the main symptom you're concerned about? (e.g., pain, itching, dryness)     Irritation, burning and itching  2. LOCATION: Where is the  vagina located? (e.g., inside/outside, left/right)     both 3. ONSET: When did the  irritation, burning  start?     2 weeks ago 4. PAIN: Is there any pain? If Yes, ask: How bad is it? (Scale: 1-10; mild, moderate, severe)     irritated 5. ITCHING: Is there any itching? If Yes, ask: How bad is it? (Scale: 1-10; mild, moderate, severe)     severe 6. CAUSE: What do you think is causing the discharge? Have you had the same problem before? What happened then?     Yeast infection 7. OTHER SYMPTOMS: Do you have any other symptoms? (e.g., fever, itching, vaginal bleeding, pain with urination, injury to genital area, vaginal  foreign body)        Denies discharge, fever, abdominal and flank pain, urinary symptoms, and all other symptoms.  Protocols used: Vaginal Symptoms-A-AH Message from Mia F sent at 10/08/2024  9:31 AM EST  Reason for Triage: Burning, itching, inflammation, very bad rash, hurts to touch. Started last week. She called in to get medication but the meds were not sent. She called to follow up on the script. Patient says she is feeling very bad.

## 2024-10-08 NOTE — Telephone Encounter (Signed)
 Fluconazole  150 mg x 1 tablet prescribed . Pls call pt and let her know per her request Make in office ppt for persistent symptioms, please recommend this to her

## 2024-10-08 NOTE — Telephone Encounter (Signed)
 Pt informed

## 2024-10-09 ENCOUNTER — Other Ambulatory Visit: Payer: Self-pay | Admitting: Family Medicine

## 2024-10-09 ENCOUNTER — Other Ambulatory Visit: Payer: Self-pay | Admitting: *Deleted

## 2024-10-09 MED ORDER — CLOTRIMAZOLE 2 % VA CREA: 1.0000 | TOPICAL_CREAM | Freq: Every day | VAGINAL | 0 refills | Status: AC

## 2024-10-09 NOTE — Telephone Encounter (Signed)
 Pt called, tablet never received by pharmacy. Pt is asking if a cream can be sent instead

## 2024-10-09 NOTE — Patient Outreach (Signed)
 Complex Care Management   Visit Note  10/09/2024  Name:  Sonya James MRN: [id number] DOB: 12-Aug-1966  Situation: Referral received for Complex Care Management related to Diabetes with Complications I obtained verbal consent from Patient.  Visit completed with Patient  on the phone  Background:   Past Medical History:  Diagnosis Date   Anemia    Anxiety    Arthritis    Asthma 05/25/2013   Autonomic neuropathy    Cancer of endometrium (HCC) 10/18/2015   Cellulitis, face 05/03/2021   Constipation    COPD (chronic obstructive pulmonary disease) (HCC)    CTS (carpal tunnel syndrome)    Depression    Diabetes mellitus without complication (HCC)    Dysphagia 12/14/2016   Encounter for screening colonoscopy    Endometrial cancer (HCC) 2017   Gastritis without bleeding    GERD (gastroesophageal reflux disease)    Gross hematuria 05/11/2019   Headache(784.0)    Heavy menses 10/11/2015   HTN (hypertension)    Hypercholesterolemia    Hypothyroidism    IBS (irritable bowel syndrome)    Osteoarthritis    osteoarthritis   Pancreatitis    per patient    Recurrent boils    buttocks and low back.   S/P laparoscopic hysterectomy 11/04/2015   Shortness of breath    Sleep apnea    CPAP machine   Uncontrolled type 2 diabetes mellitus with diabetic polyneuropathy, with long-term current use of insulin  08/04/2017   Uterine cancer (HCC) 2017   Vitamin D  deficiency     Assessment: Patient reports that she called the office yesterday and received a recommendation from Dr. Antonetta for something for a yeast infection. Upon chart review, Dr. Antonetta recommendation Diflucan  x1 and to follow up in office if no improvement - information relayed to patient. Patient became upset with RNCM stated she wanted a cream and some antibiotics like Dr. Tobie has done in the past. RNCM reiterated Dr. Kit recommendation and sent PCP office a message stating that the Diflucan  has not been ordered and that  patient is requesting a cream instead of a single dose pill for her infection. Patient stated she was very irritated at this time and stated she was hanging up the phone because her issues are not being addressed. RNCM will reschedule outreach for a later date.  Patient Reported Symptoms:  Cognitive Cognitive Status: No symptoms reported      Neurological Neurological Review of Symptoms: Not assessed    HEENT HEENT Symptoms Reported: Not assessed      Cardiovascular Cardiovascular Symptoms Reported: Not assessed    Respiratory Respiratory Symptoms Reported: Not assesed    Endocrine Endocrine Symptoms Reported: Not assessed Is patient diabetic?: Yes    Gastrointestinal Gastrointestinal Symptoms Reported: Not assessed      Genitourinary Genitourinary Symptoms Reported: Itching or irritation, Pain/burning with urination Additional Genitourinary Details: Reports severe yeast infection, swollen labia majora, and rash noted to be swelling to groin/thigh area Genitourinary Self-Management Outcome: 2 (bad)  Integumentary Integumentary Symptoms Reported: Rash, Itching Additional Integumentary Details: itching/rash noted to groin/thigh area Skin Self-Management Outcome: 3 (uncertain)  Musculoskeletal Musculoskelatal Symptoms Reviewed: Not assessed        Psychosocial Psychosocial Symptoms Reported: Irritability          10/09/2024    PHQ2-9 Depression Screening   Little interest or pleasure in doing things    Feeling down, depressed, or hopeless    PHQ-2 - Total Score    Trouble falling or staying asleep, or  sleeping too much    Feeling tired or having little energy    Poor appetite or overeating     Feeling bad about yourself - or that you are a failure or have let yourself or your family down    Trouble concentrating on things, such as reading the newspaper or watching television    Moving or speaking so slowly that other people could have noticed.  Or the opposite - being so  fidgety or restless that you have been moving around a lot more than usual    Thoughts that you would be better off dead, or hurting yourself in some way    PHQ2-9 Total Score    If you checked off any problems, how difficult have these problems made it for you to do your work, take care of things at home, or get along with other people    Depression Interventions/Treatment      There were no vitals filed for this visit.    Medications Reviewed Today   Medications were not reviewed in this encounter     Recommendation:   Acute PCP follow-up Vaginal examination.  Follow Up Plan:   Telephone follow-up in 2 weeks  [name], BSN RN [facility], [facility] RN Care Manager Direct Dial : [phone]  Fax: [phone]

## 2024-10-09 NOTE — Telephone Encounter (Signed)
PRESCRIBED

## 2024-10-09 NOTE — Patient Instructions (Signed)
 Visit Information  Ms. Devilla was given information about Medicaid Managed Care team care coordination services as a part of their [facility] Community Plan Medicaid benefit.   If you would like to schedule transportation through your [facility], please call the following number at least 2 days in advance of your appointment: [phone]   Rides for urgent appointments can also be made after hours by calling Member Services.  Call the Behavioral Health Crisis Line at [phone], at any time, 24 hours a day, 7 days a week. If you are in danger or need immediate medical attention call 911.    Telephone follow up appointment with Managed Medicaid care management team member scheduled for:  [name], BSN RN [facility], [facility] RN Care Manager Direct Dial : [phone]  Fax: [phone]

## 2024-10-16 ENCOUNTER — Other Ambulatory Visit

## 2024-10-16 ENCOUNTER — Telehealth: Payer: Self-pay

## 2024-10-16 NOTE — Telephone Encounter (Signed)
" ° °  10/16/2024 Name: Sonya James MRN: [id number] DOB: 10/03/1965  Chief Complaint  Patient presents with   Medication Management    Attempted to contact patient for scheduled appointment for medication management. Left HIPAA compliant message for patient to return my call at their convenience.    Lang Sieve, PharmD, BCGP Clinical Pharmacist  [phone]  "

## 2024-10-21 ENCOUNTER — Telehealth: Admitting: *Deleted

## 2025-01-08 ENCOUNTER — Ambulatory Visit: Admitting: Internal Medicine

## 2025-01-14 ENCOUNTER — Other Ambulatory Visit

## 2025-01-14 ENCOUNTER — Ambulatory Visit: Admitting: Physician Assistant
